# Patient Record
Sex: Female | Born: 1935 | Race: White | Hispanic: No | Smoking: Never smoker
Health system: Southern US, Community
[De-identification: ages and names within clinical notes are randomized; demographics above are authoritative.]

## PROBLEM LIST (undated history)

## (undated) DIAGNOSIS — M797 Fibromyalgia: Secondary | ICD-10-CM

## (undated) DIAGNOSIS — R609 Edema, unspecified: Secondary | ICD-10-CM

## (undated) DIAGNOSIS — M5416 Radiculopathy, lumbar region: Secondary | ICD-10-CM

## (undated) DIAGNOSIS — M1990 Unspecified osteoarthritis, unspecified site: Secondary | ICD-10-CM

## (undated) DIAGNOSIS — Z860101 Personal history of adenomatous and serrated colon polyps: Secondary | ICD-10-CM

## (undated) DIAGNOSIS — K279 Peptic ulcer, site unspecified, unspecified as acute or chronic, without hemorrhage or perforation: Secondary | ICD-10-CM

## (undated) DIAGNOSIS — R32 Unspecified urinary incontinence: Secondary | ICD-10-CM

## (undated) DIAGNOSIS — K760 Fatty (change of) liver, not elsewhere classified: Secondary | ICD-10-CM

## (undated) DIAGNOSIS — R42 Dizziness and giddiness: Secondary | ICD-10-CM

## (undated) DIAGNOSIS — E785 Hyperlipidemia, unspecified: Secondary | ICD-10-CM

## (undated) DIAGNOSIS — R140 Abdominal distension (gaseous): Secondary | ICD-10-CM

## (undated) DIAGNOSIS — S82841A Displaced bimalleolar fracture of right lower leg, initial encounter for closed fracture: Secondary | ICD-10-CM

## (undated) DIAGNOSIS — F419 Anxiety disorder, unspecified: Secondary | ICD-10-CM

## (undated) DIAGNOSIS — K589 Irritable bowel syndrome without diarrhea: Secondary | ICD-10-CM

## (undated) DIAGNOSIS — E041 Nontoxic single thyroid nodule: Secondary | ICD-10-CM

## (undated) DIAGNOSIS — R7989 Other specified abnormal findings of blood chemistry: Secondary | ICD-10-CM

## (undated) DIAGNOSIS — M810 Age-related osteoporosis without current pathological fracture: Secondary | ICD-10-CM

## (undated) DIAGNOSIS — C4490 Unspecified malignant neoplasm of skin, unspecified: Secondary | ICD-10-CM

## (undated) DIAGNOSIS — F329 Major depressive disorder, single episode, unspecified: Secondary | ICD-10-CM

## (undated) DIAGNOSIS — I10 Essential (primary) hypertension: Secondary | ICD-10-CM

## (undated) DIAGNOSIS — F32A Depression, unspecified: Secondary | ICD-10-CM

## (undated) DIAGNOSIS — R945 Abnormal results of liver function studies: Secondary | ICD-10-CM

## (undated) DIAGNOSIS — M5414 Radiculopathy, thoracic region: Secondary | ICD-10-CM

## (undated) DIAGNOSIS — Z86010 Personal history of colonic polyps: Secondary | ICD-10-CM

## (undated) DIAGNOSIS — K5790 Diverticulosis of intestine, part unspecified, without perforation or abscess without bleeding: Secondary | ICD-10-CM

## (undated) DIAGNOSIS — M316 Other giant cell arteritis: Secondary | ICD-10-CM

## (undated) DIAGNOSIS — K219 Gastro-esophageal reflux disease without esophagitis: Secondary | ICD-10-CM

## (undated) HISTORY — DX: Age-related osteoporosis without current pathological fracture: M81.0

## (undated) HISTORY — DX: Other giant cell arteritis: M31.6

## (undated) HISTORY — DX: Other specified abnormal findings of blood chemistry: R79.89

## (undated) HISTORY — DX: Radiculopathy, thoracic region: M54.14

## (undated) HISTORY — DX: Anxiety disorder, unspecified: F41.9

## (undated) HISTORY — PX: THYROIDECTOMY: SHX17

## (undated) HISTORY — PX: SKIN CANCER EXCISION: SHX779

## (undated) HISTORY — DX: Personal history of adenomatous and serrated colon polyps: Z86.0101

## (undated) HISTORY — DX: Essential (primary) hypertension: I10

## (undated) HISTORY — DX: Personal history of colonic polyps: Z86.010

## (undated) HISTORY — DX: Diverticulosis of intestine, part unspecified, without perforation or abscess without bleeding: K57.90

## (undated) HISTORY — DX: Fatty (change of) liver, not elsewhere classified: K76.0

## (undated) HISTORY — DX: Major depressive disorder, single episode, unspecified: F32.9

## (undated) HISTORY — DX: Radiculopathy, lumbar region: M54.16

## (undated) HISTORY — PX: NASAL SEPTUM SURGERY: SHX37

## (undated) HISTORY — DX: Nontoxic single thyroid nodule: E04.1

## (undated) HISTORY — DX: Gastro-esophageal reflux disease without esophagitis: K21.9

## (undated) HISTORY — DX: Fibromyalgia: M79.7

## (undated) HISTORY — DX: Unspecified malignant neoplasm of skin, unspecified: C44.90

## (undated) HISTORY — DX: Irritable bowel syndrome, unspecified: K58.9

## (undated) HISTORY — DX: Unspecified urinary incontinence: R32

## (undated) HISTORY — DX: Depression, unspecified: F32.A

## (undated) HISTORY — DX: Abnormal results of liver function studies: R94.5

## (undated) HISTORY — DX: Edema, unspecified: R60.9

## (undated) HISTORY — DX: Peptic ulcer, site unspecified, unspecified as acute or chronic, without hemorrhage or perforation: K27.9

## (undated) HISTORY — DX: Hyperlipidemia, unspecified: E78.5

## (undated) HISTORY — PX: LIVER BIOPSY: SHX301

## (undated) HISTORY — DX: Unspecified osteoarthritis, unspecified site: M19.90

## (undated) HISTORY — DX: Dizziness and giddiness: R42

## (undated) HISTORY — DX: Abdominal distension (gaseous): R14.0

## (undated) HISTORY — PX: ABDOMINAL HYSTERECTOMY: SHX81

---

## 1985-07-20 ENCOUNTER — Encounter: Payer: Self-pay | Admitting: Internal Medicine

## 1996-08-18 ENCOUNTER — Encounter: Payer: Self-pay | Admitting: Internal Medicine

## 2000-11-18 ENCOUNTER — Emergency Department (HOSPITAL_COMMUNITY): Admission: EM | Admit: 2000-11-18 | Discharge: 2000-11-18 | Payer: Self-pay

## 2002-11-08 ENCOUNTER — Encounter: Payer: Self-pay | Admitting: Allergy and Immunology

## 2002-11-08 ENCOUNTER — Encounter: Admission: RE | Admit: 2002-11-08 | Discharge: 2002-11-08 | Payer: Self-pay | Admitting: *Deleted

## 2003-03-19 HISTORY — PX: BACK SURGERY: SHX140

## 2003-06-13 ENCOUNTER — Encounter: Payer: Self-pay | Admitting: Internal Medicine

## 2004-03-06 ENCOUNTER — Inpatient Hospital Stay (HOSPITAL_COMMUNITY): Admission: RE | Admit: 2004-03-06 | Discharge: 2004-03-13 | Payer: Self-pay | Admitting: Specialist

## 2004-03-08 ENCOUNTER — Ambulatory Visit: Payer: Self-pay | Admitting: Physical Medicine & Rehabilitation

## 2004-03-13 ENCOUNTER — Inpatient Hospital Stay
Admission: RE | Admit: 2004-03-13 | Discharge: 2004-03-22 | Payer: Self-pay | Admitting: Physical Medicine & Rehabilitation

## 2004-03-18 HISTORY — PX: OTHER SURGICAL HISTORY: SHX169

## 2004-03-18 HISTORY — PX: EYE SURGERY: SHX253

## 2004-12-11 ENCOUNTER — Encounter: Admission: RE | Admit: 2004-12-11 | Discharge: 2004-12-11 | Payer: Self-pay | Admitting: Specialist

## 2005-10-18 ENCOUNTER — Encounter: Admission: RE | Admit: 2005-10-18 | Discharge: 2005-10-18 | Payer: Self-pay | Admitting: Specialist

## 2006-10-08 ENCOUNTER — Encounter: Admission: RE | Admit: 2006-10-08 | Discharge: 2006-10-08 | Payer: Self-pay | Admitting: Family Medicine

## 2006-10-08 ENCOUNTER — Encounter (INDEPENDENT_AMBULATORY_CARE_PROVIDER_SITE_OTHER): Payer: Self-pay | Admitting: *Deleted

## 2007-03-23 ENCOUNTER — Encounter: Admission: RE | Admit: 2007-03-23 | Discharge: 2007-03-23 | Payer: Self-pay | Admitting: Specialist

## 2007-11-10 ENCOUNTER — Ambulatory Visit: Payer: Self-pay | Admitting: Cardiovascular Disease

## 2007-11-11 ENCOUNTER — Encounter: Payer: Self-pay | Admitting: Cardiovascular Disease

## 2007-11-11 ENCOUNTER — Ambulatory Visit: Payer: Self-pay

## 2008-01-11 ENCOUNTER — Ambulatory Visit: Payer: Self-pay | Admitting: Gastroenterology

## 2008-01-11 ENCOUNTER — Telehealth: Payer: Self-pay | Admitting: Internal Medicine

## 2008-01-11 DIAGNOSIS — K589 Irritable bowel syndrome without diarrhea: Secondary | ICD-10-CM | POA: Insufficient documentation

## 2008-01-11 DIAGNOSIS — R1031 Right lower quadrant pain: Secondary | ICD-10-CM | POA: Insufficient documentation

## 2008-01-11 DIAGNOSIS — K5730 Diverticulosis of large intestine without perforation or abscess without bleeding: Secondary | ICD-10-CM | POA: Insufficient documentation

## 2008-01-12 ENCOUNTER — Ambulatory Visit: Payer: Self-pay | Admitting: Cardiovascular Disease

## 2008-01-12 LAB — CONVERTED CEMR LAB
BUN: 11 mg/dL (ref 6–23)
Basophils Absolute: 0.1 10*3/uL (ref 0.0–0.1)
Basophils Relative: 0.6 % (ref 0.0–3.0)
Bilirubin Urine: NEGATIVE
CO2: 29 meq/L (ref 19–32)
Calcium: 9.5 mg/dL (ref 8.4–10.5)
Chloride: 107 meq/L (ref 96–112)
Creatinine, Ser: 0.9 mg/dL (ref 0.4–1.2)
Crystals: NEGATIVE
Eosinophils Absolute: 0.2 10*3/uL (ref 0.0–0.7)
Eosinophils Relative: 1.8 % (ref 0.0–5.0)
GFR calc Af Amer: 78 mL/min
GFR calc non Af Amer: 65 mL/min
Glucose, Bld: 96 mg/dL (ref 70–99)
HCT: 41.8 % (ref 36.0–46.0)
Hemoglobin, Urine: NEGATIVE
Hemoglobin: 14.5 g/dL (ref 12.0–15.0)
Ketones, ur: NEGATIVE mg/dL
Leukocytes, UA: NEGATIVE
Lymphocytes Relative: 18.5 % (ref 12.0–46.0)
MCHC: 34.6 g/dL (ref 30.0–36.0)
MCV: 91.3 fL (ref 78.0–100.0)
Monocytes Absolute: 0.7 10*3/uL (ref 0.1–1.0)
Monocytes Relative: 5.6 % (ref 3.0–12.0)
Mucus, UA: NEGATIVE
Neutro Abs: 8.6 10*3/uL — ABNORMAL HIGH (ref 1.4–7.7)
Neutrophils Relative %: 73.5 % (ref 43.0–77.0)
Nitrite: NEGATIVE
Platelets: 281 10*3/uL (ref 150–400)
Potassium: 4.3 meq/L (ref 3.5–5.1)
RBC / HPF: NONE SEEN
RBC: 4.59 M/uL (ref 3.87–5.11)
RDW: 13 % (ref 11.5–14.6)
Sodium: 144 meq/L (ref 135–145)
Specific Gravity, Urine: 1.025 (ref 1.000–1.03)
Total Protein, Urine: 30 mg/dL — AB
Urine Glucose: NEGATIVE mg/dL
Urobilinogen, UA: 0.2 (ref 0.0–1.0)
WBC: 11.8 10*3/uL — ABNORMAL HIGH (ref 4.5–10.5)
pH: 5.5 (ref 5.0–8.0)

## 2008-01-18 ENCOUNTER — Ambulatory Visit: Payer: Self-pay | Admitting: Gastroenterology

## 2008-01-18 ENCOUNTER — Telehealth: Payer: Self-pay | Admitting: Internal Medicine

## 2008-01-18 DIAGNOSIS — K921 Melena: Secondary | ICD-10-CM | POA: Insufficient documentation

## 2008-01-19 ENCOUNTER — Encounter: Payer: Self-pay | Admitting: Internal Medicine

## 2008-01-19 ENCOUNTER — Encounter: Payer: Self-pay | Admitting: Physician Assistant

## 2008-01-19 ENCOUNTER — Ambulatory Visit: Payer: Self-pay | Admitting: Internal Medicine

## 2008-01-21 ENCOUNTER — Encounter: Payer: Self-pay | Admitting: Internal Medicine

## 2008-01-21 LAB — CONVERTED CEMR LAB
Basophils Absolute: 0 10*3/uL (ref 0.0–0.1)
Basophils Relative: 0.2 % (ref 0.0–3.0)
Eosinophils Absolute: 0.2 10*3/uL (ref 0.0–0.7)
Eosinophils Relative: 2.2 % (ref 0.0–5.0)
HCT: 41.6 % (ref 36.0–46.0)
Hemoglobin: 14.5 g/dL (ref 12.0–15.0)
Lymphocytes Relative: 24.2 % (ref 12.0–46.0)
MCHC: 34.8 g/dL (ref 30.0–36.0)
MCV: 91.5 fL (ref 78.0–100.0)
Monocytes Absolute: 0.7 10*3/uL (ref 0.1–1.0)
Monocytes Relative: 7.6 % (ref 3.0–12.0)
Neutro Abs: 6.5 10*3/uL (ref 1.4–7.7)
Neutrophils Relative %: 65.8 % (ref 43.0–77.0)
Platelets: 270 10*3/uL (ref 150–400)
RBC: 4.54 M/uL (ref 3.87–5.11)
RDW: 13.3 % (ref 11.5–14.6)
Sed Rate: 27 mm/hr — ABNORMAL HIGH (ref 0–22)
WBC: 9.7 10*3/uL (ref 4.5–10.5)

## 2008-06-29 ENCOUNTER — Encounter: Payer: Self-pay | Admitting: Internal Medicine

## 2008-07-29 ENCOUNTER — Encounter: Payer: Self-pay | Admitting: Internal Medicine

## 2008-08-04 ENCOUNTER — Encounter: Payer: Self-pay | Admitting: Internal Medicine

## 2008-12-23 ENCOUNTER — Encounter: Payer: Self-pay | Admitting: Internal Medicine

## 2008-12-26 ENCOUNTER — Telehealth: Payer: Self-pay | Admitting: Internal Medicine

## 2008-12-26 ENCOUNTER — Encounter: Payer: Self-pay | Admitting: Internal Medicine

## 2008-12-27 DIAGNOSIS — Z860100 Personal history of colon polyps, unspecified: Secondary | ICD-10-CM | POA: Insufficient documentation

## 2008-12-27 DIAGNOSIS — Z8639 Personal history of other endocrine, nutritional and metabolic disease: Secondary | ICD-10-CM

## 2008-12-27 DIAGNOSIS — Z862 Personal history of diseases of the blood and blood-forming organs and certain disorders involving the immune mechanism: Secondary | ICD-10-CM | POA: Insufficient documentation

## 2008-12-27 DIAGNOSIS — E785 Hyperlipidemia, unspecified: Secondary | ICD-10-CM | POA: Insufficient documentation

## 2008-12-27 DIAGNOSIS — K7689 Other specified diseases of liver: Secondary | ICD-10-CM | POA: Insufficient documentation

## 2008-12-27 DIAGNOSIS — E781 Pure hyperglyceridemia: Secondary | ICD-10-CM | POA: Insufficient documentation

## 2008-12-27 DIAGNOSIS — I10 Essential (primary) hypertension: Secondary | ICD-10-CM | POA: Insufficient documentation

## 2008-12-30 ENCOUNTER — Ambulatory Visit: Payer: Self-pay | Admitting: Internal Medicine

## 2008-12-30 LAB — CONVERTED CEMR LAB
A-1 Antitrypsin, Ser: 146 mg/dL (ref 83–200)
AFP-Tumor Marker: 3.8 ng/mL (ref 0.0–8.0)
Anti Nuclear Antibody(ANA): NEGATIVE
Ceruloplasmin: 34 mg/dL (ref 21–63)
HCV Ab: NEGATIVE
Hep B S Ab: NEGATIVE
Hepatitis B Surface Ag: NEGATIVE

## 2009-01-02 ENCOUNTER — Encounter: Payer: Self-pay | Admitting: Internal Medicine

## 2009-01-03 LAB — CONVERTED CEMR LAB
Ferritin: 69.6 ng/mL (ref 10.0–291.0)
INR: 1 (ref 0.8–1.0)
IgG (Immunoglobin G), Serum: 577 mg/dL — ABNORMAL LOW (ref 694–1618)
IgM, Serum: 72 mg/dL (ref 60–263)
Prothrombin Time: 10.8 s (ref 9.1–11.7)
TSH: 2.75 microintl units/mL (ref 0.35–5.50)

## 2009-01-10 ENCOUNTER — Encounter: Payer: Self-pay | Admitting: Internal Medicine

## 2010-03-18 HISTORY — PX: OTHER SURGICAL HISTORY: SHX169

## 2010-07-31 NOTE — Assessment & Plan Note (Signed)
[facility]                            CARDIOLOGY OFFICE NOTE   Sally, Acosta                        MRN:          [id number]  DATE:11/10/2007                            DOB:          1929/08/10    A 77 year old patient referred for profound fatigue, dyspnea.   The patient had been feeling ill about 6-7 weeks ago.  She says she has  a history of fibromyalgia.  She had profound weakness.  She had  difficulty even lifting her legs up.  She was seen by Dr. Collins Acosta and  blood work was done.  I have some of the blood work and her white blood  cell count, hemoglobin, crit, and BMET were normal.  I do not have her  TSH back yet.   Aside from general malaise, the patient denies being depressed.  In  fact, she just celebrated her 1st anniversary with her second husband  this past Saturday.  She initially thought that her profound fatigue was  due to her fibromyalgia, but it did not seem to improve.  Interestingly,  over the last 2 weeks, she seems to be doing better.  There has been no  generalized weight loss.  No fever.  She does complain of diaphoresis  and what sound like hot flashes.   She says she went through menopause many years ago and does not know why  she would have these.   From a cardiac perspective, there has been no palpitations, PND, or  orthopnea.  No presyncope.  No evidence of low blood pressure.  She has  never had a history of coronary artery disease or decreased LV function.   She has been seen in the clinic previously by Dr. Jimmy Footman in 1997 and  had a normal stress test.  At that time, her coronary risk factor  included hypercholesterolemia and hypertension.   The patient's review of system is otherwise negative.  She does have  some chronic bronchitis and asthma and wheezes a bit with exercise.  There is some functional exertional dyspnea, although she seems out of  shape and does have her asthma.  I do not think that her  dyspnea is  related to her heart.   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   ALLERGIES:  The patient is allergic to SULFA.   MEDICATIONS:  She is on Azor 10/40; fish oil; fluoxetine, question dose;  aspirin; Prilosec; ReQuip; and Nasonex.   FAMILY HISTORY:  Noncontributory.   SOCIAL HISTORY:  The patient is happily remarried.  She does not smoke.  She has no alcohol.  She is otherwise fairly sedentary.  She no longer  works and is retired.  She does watch a lot of TV.   PHYSICAL EXAMINATION:  GENERAL:  Her exam is remarkable for an  overweight white female in no distress.  Affect is appropriate.  VITAL SIGNS:  Weight is 191, blood pressure is 140/80, pulse  88 and  regular, respiratory 14, afebrile.  HEENT:  Unremarkable.  NECK:  Carotids normal without bruit.  No lymphadenopathy, thyromegaly,  or JVP elevation.  LUNGS:  Clear with good diaphragmatic motion.  No wheezing.  CARDIAC:  S1 and S2.  Normal heart sounds.  PMI normal.  ABDOMEN:  Benign.  Bowel sounds positive.  No AAA.  No tenderness.  No  bruit.  No hepatosplenomegaly or hepatojugular reflux.  No tenderness.  EXTREMITIES:  Distal pulses are intact.  No edema.  NEURO:  Nonfocal.  SKIN:  Warm and dry.  MUSCULOSKELETAL:  No muscular weakness.   SURGICAL HISTORY:  History of deviated septum, history of a  hysterectomy, and partial history of lower back surgery.   Her electrocardiogram today shows sinus rhythm with nonspecific ST-T  wave changes and no change from EKG done in 1997.   IMPRESSION:  1. Fatigue, not related to heart.  Normal cardiac exam.  No change in      EKG.  2. Exertional dyspnea likely related asthmatic bronchitis.  Consider      followup PFTs.  She has previously been on Advair and this was      stopped due  to blistering of her mouth, consider alternative.  We      will check a 2-D echocardiogram to make sure her right ventricle      and left ventricle function are normal and that there is no      structural heart disease accounting for her profound fatigue and      dyspnea.  3. Hypertension, currently well controlled.  Continue angiotensin-      converting enzyme.  4. Hyperlipidemia, low cholesterol diet.  Continue attempts of weight      loss and exercise.  Continue Azor.  5. Overall, the patient does deny history of depression; however,      given her history of fibromyalgia, I wonder if it would not be      worthwhile for her to switch to a different antidepressant.  I am      also not up-to-date on the latest fibromyalgia drugs, but it may be      worthwhile for her to try one of these.  She will follow with Dr.      Collins Acosta for any further lab work that is necessary.  As long as her      echo is normal, she will not need cardiac followup.     Noralyn Pick. Eden Emms, MD, [facility]  Electronically Signed    PCN/MedQ  DD: 11/10/2007  DT: 11/11/2007  Job #: [id number]   cc:   [name], M.D.

## 2010-08-03 NOTE — Op Note (Signed)
NAMEJOWANDA, HEEG               ACCOUNT NO.:  [id number]   MEDICAL RECORD NO.:  [id number]          PATIENT TYPE:  INP   LOCATION:  5006                         FACILITY:  [facility]   PHYSICIAN:  Kerrin Champagne, M.D.   DATE OF BIRTH:  06-30-1929   DATE OF PROCEDURE:  03/07/2004  DATE OF DISCHARGE:                                 OPERATIVE REPORT   PREOPERATIVE DIAGNOSIS:  Lumbar spinal stenosis, L3-4 and L4-5.  Degenerative disk disease, L3-4 and L4-5, L5-S1 and L2-3 with degenerative  spondylolisthesis at L4-5 and L5-S1 and L3-4.  Herniated nucleus pulposus  left L2-3.  Central laminectomy L3 to S1 three levels.  Left-sided L2-3  microdiskectomy.  L3 to S1 posterolateral fusion utilizing local and  Symphony bone graft, posterior instrumentation from L3 to S1 utilizing  Monarch pedicle screws and rods.  Right-sided L4-5 and L5-S1 transforaminal  lumbar interbody fusion utilizing Leopard  cage at the L4-5 level and  Leopard lordotic 9 mm cage at the L5-S1 level.   POSTOPERATIVE DIAGNOSIS:   OPERATION PERFORMED:   SURGEON:  Kerrin Champagne, M.D.   ASSISTANT:  Wende Neighbors, P.A.-C   ANESTHESIA:  General orotracheal anesthesia.  Dr. Gypsy Balsam.   ESTIMATED BLOOD LOSS:  650 mL.   BLOOD REPLACED:  One unit Cell Saver blood.   DRAINS:  Foley catheter to straight drain.  Hemovac times one.   INDICATIONS FOR PROCEDURE:  The patient is a 77 year old female with  progressive neurogenic claudication.  Pain is worsening in both lower  extremities with ambulation upright position.  Evaluated, found to have  three segments of lumbar spinal stenosis involving L3-4, L4-5 and mild L2-3  with associated protruded disk along the left side at L2-3.  Degenerative  disk changes at L5-S1 with spondylolisthesis at L5-S1 that is dynamic with  flexion only.  She is brought to the operating room to undergo initially a  two-level fusion at L4-5 and L5-S1; however, since the patient has a  spondylolisthesis at the L3-4 level as well, it is felt that the fusion  should be extended to the L3-4 level as well and this was done during the  procedure.  The patient was found to have disk protrusion on the left side  at L2-3 and a subligamentous disk herniation was excised.   DESCRIPTION OF PROCEDURE:  After adequate general anesthesia, initially  intubated, a Foley catheter placed, was then rolled to a supine position.  Chest rolls were used.  All pressure points well padded.  Standard prep with  DuraPrep solution.  Draped in the usual manner extending from the dorsal  spine to the mid sacral level.  She was given preoperative antibiotics of  Ancef.  Also had preoperative placement of leg EMG nerve conduction  monitoring of soft tissue resistance and EMGs while placing pedicle screws.  Initial incision extending from about the L1 level to S2.  The midline  through the skin and subcutaneous layers and S1.  Incision carried over the  lateral aspect of the spinous process of L5, L4, L3, L2.  Clamps were placed  over the spinous  process of L5 and L4 and these areas marked for continued  identification throughout the procedure.   Initial cerebellar retractors were placed as the paralumbar muscles were  released off the posterior aspects of the posterior elements extending from  L2 to the sacrum to 1 levels.  Exposure obtained out to the transverse  process of L3 at the L2-3 level, L4 at the L4-5 level and over the sacral  ala on both sides at the L5-S1 level.   Micro retractor replaced and deepened.  Sponges used to control bleeders as  well as electrocautery and bipolar electrocautery.  Leksell rongeur then  used to resect the spinous process of L3-L4 and L5 removing also the spinous  process of S1, inferior 50% of the spinous process of L2, central portions  and lamina of L3, L4 then used to remove central portions of lamina of L5  continuing superiorly the L4-5 level down to the  inferior aspect of L4,  continue superiorly resecting the central portions of lamina up to the L3-4  level and then the inferior aspect of the L3 continued superiorly with the  L2-3 level widening the central laminectomy using 3 mm Kerrison.  1/4 inch  osteotome then used to resect medial facet over the inferior articular  process of L5 bilaterally resecting about 50% of the joint at the L4-5 level  similarly resecting the inferior articular process medially at the 4-5 level  bilaterally about 50%.  Only minimal partial resection on the left side  about 10 to 15% was performed for exposure of the left lateral aspect of the  canal.  Ligamentum flavum resected at the L2-3 level off of the inferior  anterior aspect of the lamina of L2 bilaterally.  Then from the area of the  L2-3 interlaminar region posteriorly.  Then at 3-4 and at 4-5 and 5-1.  Note  that at 3-4, 4-5 and 5-1 hypertrophic ligamentum flavum changes were causing  significant compression on the thecal sac bilaterally within the lateral  recesses impinging on bilateral  L3 nerve roots within the neuroforamen.  The L4 nerve roots were then lateral recessed and L4 neuroforamen and the L5  foraminotomy was performed over both L5 nerve roots, both S1 nerve roots,  both L4 nerve roots and both L3 nerve roots such that a hockey stick neuro  probe could finally be passed out the neuroforamen over the anterior and  posterior aspects of each L3, L4, L5 and S1 nerve roots on the left side and  each L4, L5 and S1 on the right side therefore determining that  decompression had taken place.  The operating room microscope was draped and  brought into the field.  From the left side the thecal sac the L2-3 level  was carefully retracted medially to L2-3, noted to have protruding disk on  the left side subligamentous fashion.  A cruciate incision made in the posterior aspect of the disk on the left side and micropituitary used  to  excise moderate  protruded subligamentous disk on the left side which was  impinging on the left lateral recess and affecting it.   This completed then, attention turned to performing placement of pedicle  screws.  Local bone graft obtained from the central laminectomy site.  It  was carefully debrided of any soft tissue attachment and then used in  combination with Symphony and allograft bone material and make Symphony logs  that had platelet rich plasma as well with increased local bone building  factors.  Two Symphony logs were able to be made with this material.  A  pedicle finder was used to make an initial entry point into the midportion  of the transverse process and the center section of the superior articular  process of L3 on the left side and then a pedicle probe used to probe the  pedicle on the left side to about 40 mm.  Similarly this was done on the  right side at L3 pedicle at the level of the L3 transverse process section  with the superior articular process of L4.  This was done on the left side,  then on the right side and then performed using pedicle probe straight probe  left side then right side.  Each of the pedicle channels was then probed  using a ball tip probe to ensure patency of the canal and no evidence of  broach of the cortex.  C-arm fluoroscopy brought into the field ascertaining  correct position and alignment of each of the probes within the channels  within the pedicles seen on AP and lateral views.  Each of these holes at  the L3 and L4 were then carefully tapped with a 5.5 tap and using a proper  degree of convergence, 40 x 6.25.  The L4 level 45 mm screws were placed  bilaterally measuring 6.  Next the L5 pedicle screws were placed.  Note that  prior to placing the screws, the transverse processes were decorticated and  Symphony bone graft placed over the transverse process of L3 to L4  bilaterally.  The L5 pedicles were then probed each after placing an awl  within  the central portions of the transverse process and resection of the  superior articular process of L5 and then probing the pedicle appropriately.  Then __________ using first an awl to make an entry point into the posterior  aspect of the pedicle, the pedicle probe probed appropriately measuring  depth and placing a curved __________  each of the screw head fasteners was  carefully loosened using the screw breakers, head breakers.  Each was  measured for soft tissue resistance.  Each measured greater than 20 mm.  A  95 mm length curved rod was then placed into the fastener on the left side  also on the right side.  Right and left side caps were placed.  Having to  use the approximator at the L4 level bilaterally was the only segment  requiring the approximator to place the cap onto the posterior aspect of the  fastener at this segment. It did not require a great deal of physical stress to place this.  Note that bone graft was applied over the transverse process  extending from L3 to L4 to L5 and to the sacral ala, both sides over all.  These areas were decorticated prior to placement of pedicle screws and bone  graft was then placed prior to placement of pedicle screws in place.  Attention was then turned to TLIF on the right side at L4-5 and L5-S1.  Medial facetectomy complete fastening on the right side at L4-5 and on the  right side at L5-S1, completing resection of the facet out into the foramen  decompressing the right L4 and L5 nerve roots.  Attention was turned to the  L4 level where the epidural veins were cauterized along the right side at  the posterior aspect of the disk space at L4-5.  Retracted D'Errico nerve  root as well as the thecal  sac then incision made in the posterior aspect of  this posterolaterally using a 15 blade scalpel.  Pituitaries used to excise  the disk.  The disk was dilated first using an 8 mm dilator unable to use a  9 mm dilator.  It was felt that an 8 was  adequate as judged on a C-arm  fluoro to fill the disk space well.  With attempts at placing a 8 mm trial  TLIF it was noted that this could not be placed adequately. It was felt to  provide too tight a fit so that a 7 mm trial was used.  This provided  excellent dilatation of the disk space and good bone to trial surfaces.  Curettage was performed in the disk space end plate using regular curettes  as well as ring curettes.  Pituitary rongeurs used to resect further disk  material present. A small amount of bone graft obtained posteriorly from the  facets was placed within the disk space at L4-5.  Bone graft was obtained  from the right iliac crest through a separate fascial incision over the  right iliac crest beneath the subcu layers of fat the right side.  Incision  on the posterior aspect of the iliac crest subperiosteally dissection  carried medially and lateral. A 3/4 inch osteotome used to resect bone over  the superior aspect of the crest and then gouges used to remove cancellous  bone used for the TLIF at both L4-5 and L5-S1.  This cancellous bone  provided the best bone graft for placement within the cages and potentially  into the disk space at 4-5 and 5-1.  Once the cage had been filled,  distraction was obtained across the pedicle screws in the L4-5 level along  the right side with retraction of the thecal sac and nerve root and ensuring  these were well protected in the cage a permanent 7 mm parallel edge cage  was then inserted and impacted in place and kicked across midline into good  position and alignment in both AP and lateral planes.  Attention was then  turned to the L5-S1 level where similarly the disk space was exposed on the  right side, epidural veins cauterized using bipolar cautery.  The L5 nerve  root retracted superiorly, disk space exposed by retracting lateral aspect  of thecal sac medially.  15 blade scalpel used to incise the disk space. Pituitary rongeurs  used to excise disk material back to end plates.  End  plated curettaged using plain curettes as well as ring curettes down to  bleeding bone end plates.  Dilatation of the disk space carried up to  approximately 9 mm.  9 mm trial was inserted provided excellent fit. It was  decided to use a 9 mm lordotic cage.   This was then removed.  Disk space had small amount of bone graft placed  within the disk space.  The cage was then filled with cancellous bone graft  taken from the right iliac crest.  The cage then placed in the correct  position alignment.  Distraction obtained across the pedicle screws on the  right side at L5 and S1 and the cage inserted, impacted into place  subsetting it beneath posterior aspect of the disk space at L5-S1, then  kicking it across the midline using the kicker impactors.  This observed  with C-arm fluoroscopy to be in good position and alignment.  Irrigation  performed.  Thrombin-soaked Gelfoam used to perform hemostasis as well  as  bipolar electrocautery.  Examination of disk spaces demonstrated no further  bleeding evident.  No bone within the canal noted.  With this, then the  fasteners at the L3 level were carefully torqued down to the rods at the L3  level to 80 foot pounds.  The L4-5 level the fastener at the L4 level was  then carefully torqued 80 foot pounds bilaterally.  Then the 4-5 fasteners  were carefully torqued and placed under compression compressing the L4-5  level.  Then compression obtained between the L5 and S1 fasteners  bilaterally and compression obtained and these were then fastened to 80 foot  pounds.  This completed fixation of spine from L3 to S1.  And the TLIF of  the L4-5 and L5-S1 level.  C-arm images were obtained following placement of  the transverse loading rod at the L3-4 level of 100 mm length from side-to-  side.  Irrigation performed.  Carefully, the paralumbar muscles bilaterally  debrided of any devitalized tissue.   Medium Hemovac drain placed in the  depth of the incision exiting over the right side, the iliac crest bone  graft harvest site carefully hemostased with bone wax, thrombin-soaked  Gelfoam.  The fascial layers then approximated with over the iliac crest  with running stitch of #1 Vicryl.  The plane to the iliac crest carefully  closed with interrupted #1 Vicryl sutures.  The paralumbar muscles  approximated with loosely in the midline with interrupted #1 Vicryl sutures,  paralumbar muscles, lumbodorsal fascia approximated in the midline with  interrupted  figure-of-eight simple sutures of #1 Vicryl, deep subcu layers  approximated with interrupted #1 and 0 Vicryl sutures, more superficial  layers with interrupted 2-0 Vicryl sutures and skin closed with running  subcutaneous stitch of 4-0 Vicryl.  Tincture of Benzoin and Steri-Strips  applied, 4 x 4s, ABD pad fixed to the skin with paper tape.  The patient was then returned to a supine position, reactivated, extubated returned to  recovery room in satisfactory condition. All instrument sponge counts were  correct.      Jame   JEN/MEDQ  D:  03/07/2004  T:  03/08/2004  Job:  [id number]

## 2010-08-03 NOTE — Discharge Summary (Signed)
[name], [name]               ACCOUNT NO.:  [id number]   MEDICAL RECORD NO.:  [id number]          PATIENT TYPE:  ORB   LOCATION:  4531                         FACILITY:  [facility]   PHYSICIAN:  Mariam Dollar, P.A.  DATE OF BIRTH:  [birth year]   DATE OF ADMISSION:  03/13/2004  DATE OF DISCHARGE:  02/20/2004                                 DISCHARGE SUMMARY   DISCHARGE DIAGNOSES:  1.  Lumbar spinal stenosis with radiculopathy.  2.  Pain control.  3.  Subcutaneous Lovenox for deep vein thrombosis prophylaxis.  4.  Hypertension.  5.  Hyperlipidemia.  6.  Chronic obstructive pulmonary disease.  7.  Gastroesophageal reflux disease.  8.  Irritable bowel syndrome.  9.  Depression.  10. E. coli gram-negative rod urinary tract infection, resolved.   PROCEDURES:  Status post lumbar L3-L4, L4-L5, L5-S1 central laminectomy with  fusion March 06, 2004 for Dr. Otelia Sergeant.   HISTORY OF PRESENT ILLNESS:  This is a 77 year old female admitted on  March 06, 2004 with low back pain radiating to the lower extremities.  X-  rays and imaging with lumbar spinal stenosis with radiculopathy.  Underwent  L3-4, L4-5, L5-S1 central laminectomy with fusion on March 06, 2004 for  Dr. Otelia Sergeant.  Fitted with back brace.  Mild right foot drop, fitted with AFO  brace.  Pain control with OxyContin sustained release 10 mg every 12 hours,  admitted to subacute care services.   PAST MEDICAL HISTORY:  See discharge diagnoses.   ALLERGIES:  SULFA and SEPTRA.   SOCIAL HISTORY:  [place]-level  home with a ramp.  Son with anterior cervical disk fusion in the past on  disability but can assist.   MEDICATIONS PRIOR TO ADMISSION:  Prozac, aspirin, enalapril, Neurontin,  Vytorin, Prilosec, and multiple nebulizer treatments.   HOSPITAL COURSE:  The patient is with progressive gains while on  rehabilitation services with therapies initiated daily.  The following  issues are  followed during the patient's rehabilitation course.  Pertaining  to [name]'s lumbar spinal stenosis with radiculopathy, surgical site  healing nicely.  Back brace when out of bed.  She was now supervision for  ambulating with a rolling walker.  She would follow up with Dr. Otelia Sergeant.  Pain  control with OxyContin sustained release.  This had been titrated to 30 mg  every 12 hours with slow taper at the time of discharge.  Her pain had  greatly improved.  She did remain on her Neurontin at 300 mg twice daily.  Subcutaneous Lovenox was used for deep vein thrombosis prophylaxis  throughout her rehabilitation course and discontinued at discharge.  Aspirin  resumed as prior to hospital admission.  Blood pressure is controlled with  Vasotec.  She remained on her home nebulizer treatments for chronic  obstructive pulmonary disease with oxygen saturations greater than 90%.  She  had no bouts of shortness of breath.  There was a documented history of  depression.  She remained on Prozac daily.  Her mood and spirit remained  upbeat throughout her rehabilitation course.  She had  no bowel or bladder  disturbances.  Overall, for her functional state she was supervision bed  mobility, minimal assist transfers, supervision 60 feet with a rolling  walker, simple set up for activities of daily living other than minimal  assistance, lower body dressing.  She was discharged to home with home  health therapy.   LATEST LABORATORY:  Hemoglobin 9.6, hematocrit 27.8, sodium 137, potassium  4.4, BUN 9, creatinine 0.6.  Noted E. coli, gram-negative rod, urinary tract  infection.  She had been placed on amoxicillin March 17, 2004 for a seven-  day course.  She denied any dysuria or hematuria.   DISCHARGE MEDICATIONS:  1.  Protonix 40 mg daily.  2.  Nasonex 50 mcg 2 sprays daily.  3.  MiraLax daily.  4.  Colace 100 mg twice daily.  5.  Prozac 40 mg daily.  6.  Aspirin 81 mg daily.  7.  Vasotec 5 mg daily.   8.  Neurontin 300 mg twice daily.  9.  Vytorin 1 tablet daily.  10. Amoxicillin 250 mg three times daily until March 24, 2004, then stop.  11. OxyContin sustained release 30 mg every 12 hours x1 week and then 20 mg      every 12 hours x1 week, then 10 mg every 12 hours x1 week, then      discontinue.  12. Oxycodone immediate release as needed for breakthrough pain.   ACTIVITY:  As tolerated with back brace.   DIET:  Regular.   WOUND CARE:  Cleanse incision daily with soap and water.   SPECIAL INSTRUCTIONS:  Home health physical and occupational therapy.  She  will follow up with Dr. Otelia Sergeant as advised.       DA/MEDQ  D:  03/21/2004  T:  03/21/2004  Job:  [id number]   cc:   Ranelle Oyster, M.D.  [address]  [place] [address]  Fax: [phone]   Tammy R. Collins Scotland, M.D.  [address]  [place]  [place] [address]  Fax: [phone]   Kerrin Champagne, M.D.  [address]  [place]  [place] [address]  Fax: [phone]

## 2010-08-03 NOTE — Discharge Summary (Signed)
[name], [name]               ACCOUNT NO.:  [id number]   MEDICAL RECORD NO.:  [id number]          PATIENT TYPE:  INP   LOCATION:  5006                         FACILITY:  [facility]   PHYSICIAN:  Kerrin Champagne, M.D.   DATE OF BIRTH:  1930-01-27   DATE OF ADMISSION:  03/06/2004  DATE OF DISCHARGE:  03/13/2004                                 DISCHARGE SUMMARY   ADMISSION DIAGNOSIS:  1.  Lumbar spinal stenosis L3-4 and L4-5. Degenerative disk disease L3-4 and      L4-5, L5-S1 and L2-3 with degenerative spondylolisthesis at L4-5 and L5-      S1 and L3-4. Herniated nucleus pulposus left L2-3.  2.  Fibromyalgia.  3.  Hypertension.  4.  Irritable bowel syndrome.  5.  Gastroesophageal reflux disease.  6.  Anxiety and depression.   DISCHARGE DIAGNOSIS:  1.  Lumbar spinal stenosis L3-4 and L4-5. Degenerative disk disease L3-4 and      L4-5, L5-S1 and L2-3 with degenerative spondylolisthesis at L4-5 and L5-      S1 and L3-4. Herniated nucleus pulposus left L2-3.  2.  Fibromyalgia.  3.  Hypertension.  4.  Irritable bowel syndrome.  5.  Gastroesophageal reflux disease.  6.  Anxiety and depression.  7.  Mild postop anemia.  8.  Mild postop ileus resolved.  9.  Urinary retention resolved.   PROCEDURE:  On March 06, 2004, the patient underwent central laminectomy  L3-S1. Left side L2-3 microdiskectomy. L3-S1 posterolateral fusion utilizing  a local and Symphony bone graft, posterior instrumentation from L3-S1  utilizing Monarch pedicle screws and rods. Right sided L4-5 and L5-S1  transforaminal lumbar interbody fusion using Leopard cage at the L4-5 level  and Leopard lordotic cage at the L5-S1 level. This was performed by Kerrin Champagne, M.D. and assisted by Wende Neighbors, P.A. under general anesthesia.   CONSULTATION:  Physical medicine and rehabilitation.   BRIEF HISTORY:  The patient is a 77 year old female with progressive  neurogenic claudication and worsening lower extremity  pain with difficulty  ambulating. She was noted to have three segments of lumbar spinal stenosis  involving L3-4, L4-5 and mild at L2-3 with associated disk protrusion along  the left side at the L2-3 level. Degenerative disk changes at the L5-S1  level with spondylolisthesis that was dynamic with flexion only was also  noted on evaluation. It was felt that she would require surgical  intervention as she was having no relief of her discomfort with conservative  treatment. She had progressive debilitation. The patient was admitted for  the procedure as stated above.   BRIEF HOSPITAL COURSE:  The patient tolerated the procedure under general  anesthesia without complications. She received 1 unit of Cell Saver blood  intraoperatively. The patient's hemoglobin and hematocrit postoperatively  was 8.2 and 23.80. She did require further blood transfusion.  Postoperatively the patient did have low urine output as well as low blood  pressure. Her IV fluids were increased and transfusion given as well. The  patient was placed on ileus precautions and was NPO. She was given Reglan IV  and Dulcolax p.o. The patient had difficulty with attempts at ambulation on  the first postoperative day. She was somewhat sedated secondary to narcotic  analgesics. Physical therapy was eventually able to assist the patient with  sitting up in bed and then gradually progressing onto ambulation. Her lumbar  brace was donned and doffed with the patient seated at bedside. She was very  slow to progress with her activity level and a rehab consult was obtained.  She was felt to be a suitable candidate for inpatient rehabilitation and was  placed on the bed waiting list. While on the bed waiting list she continued  to be monitored on the orthopedic unit. There her sedation decreased as her  narcotic analgesics were decreased and she was able to be changed to p.o.  analgesics. The patient did have some noticeable weakness  with the right  EHL. She also had skin tears from the tape on her back. Her incision was  healing well throughout the hospital stay. Her drain was removed on the  first postoperative day. The patient continued to have a distended abdomen  with minimal bowel sounds until she had a bowel movement on the third  postoperative day. After the bowel movement her diet was progressed. She was  then able to take a regular diet without difficulty. An AFO was obtained for  her right ankle dorsiflexion weakness to be worn with her regular shoeing.  Eventually a bed was available on the rehab unit on March 13, 2004 and  she was admitted there. At discharge she was in stable condition.   LABORATORY VALUES:  The patient received a total of 3 units of packed red  blood cells during the hospital stay secondary to anemia. On March 08, 2004, portable abdomen series showed moderate ileus. On admission CBC was  within normal limits. Hemoglobin dropped to the lowest value of 8.21 and at  discharge the patient's hemoglobin was 9.2 with hematocrit 26.9. On December  25, her WBC was 11.2. Coagulation studies on admission normal. Chemistry  studies on admission were within normal limits with exception of AST 63, ALT  45. The patient did have low sodium of 134 on December 23. Glucose  fluctuated from 89-151. IV fluid adjustments were made to amend these  chemistry values. Urinalysis on December 26 was negative for urinary tract  infection. Repeat on December 23 showed moderate leukocyte Estrace, few  epithelial cells, 7-10 WBCs, 0-2 RBCs with few bacteria.   PLAN:  The patient was transferred to the inpatient subacute care unit for  continued physical therapy, occupational therapy. She was given instructions  for ambulation and gait training wearing her LSO at all times when out of  bed. She was able to be at bedrest without the lumbar support. Dressing changes were done daily with appropriate wound care.  Occupational therapy  assisted with ADLs. A list of the patient's medications were sent with her  to the rehab unit for continued treatment. She was instructed to follow up  with Dr. Otelia Sergeant following her stay at the rehab unit. Dr. Otelia Sergeant also will  follow with the patient while she is still in the hospital for continued  orthopedic care.   CONDITION ON DISCHARGE:  Stable.      SMV/MEDQ  D:  05/31/2004  T:  05/31/2004  Job:  [id number]

## 2010-09-05 ENCOUNTER — Encounter: Payer: Self-pay | Admitting: Internal Medicine

## 2010-09-05 ENCOUNTER — Ambulatory Visit (INDEPENDENT_AMBULATORY_CARE_PROVIDER_SITE_OTHER): Payer: Medicare Other | Admitting: Internal Medicine

## 2010-09-05 VITALS — BP 132/64 | HR 88 | Ht 63.0 in | Wt 198.0 lb

## 2010-09-05 DIAGNOSIS — R160 Hepatomegaly, not elsewhere classified: Secondary | ICD-10-CM

## 2010-09-05 DIAGNOSIS — K7689 Other specified diseases of liver: Secondary | ICD-10-CM

## 2010-09-05 DIAGNOSIS — R7989 Other specified abnormal findings of blood chemistry: Secondary | ICD-10-CM

## 2010-09-05 NOTE — Progress Notes (Signed)
[name] 12-06-1929 MRN [id number]     History of Present Illness:  This is a 77 year old white female with hepatomegaly and abnormal liver function tests. She had a complete evaluation in October 2010. At that time, she had an AST of 70, ALT of 51 and serum albumin of 4.1. Her CT scan in May 2010 confirmed hepatomegaly due to fatty liver. Her ferritin was 69, IgG was 577, IgM was 72, and INR was 1.0. Her Hepatitis B and C serologies have been negative. Her ceruloplasmin was normal as well as her alpha-fetoprotein and ANA titer. She continues to have abnormal AST and ALT. Her most recent blood tests on 07/27/2010 showed AST of 111 and ALT of 78 with a normal alkaline phosphatase, albumin and total bilirubin. She denies abdominal pain. She denies easy bruising but admits to peripheral fluid retention. She has taken Celebrex intermittently for her DJD. A colonoscopy in November 2009 showed an adenomatous polyp. Her next colonoscopy will be due in November 2014.   Past Medical History  Diagnosis Date  . Anxiety disorder   . Arthritis   . Asthma   . Hx of adenomatous colonic polyps   . Fibromyalgia   . Hyperlipidemia   . Hypertension   . IBS (irritable bowel syndrome)   . Peptic ulcer disease   . Fatty liver   . Abnormal LFTs (liver function tests)   . Diverticulosis    Past Surgical History  Procedure Date  . Abdominal hysterectomy   . Thyroidectomy   . Nasal septum surgery     reports that she has quit smoking. She has never used smokeless tobacco. She reports that she does not drink alcohol or use illicit drugs. family history includes Arthritis in an unspecified family member; Colon polyps in an unspecified family member; Diabetes in her father; Heart disease in her father and mother; Kidney disease in an unspecified family member; and Lung cancer in an unspecified family member.  There is no history of Colon cancer. Allergies  Allergen Reactions  . Sulfonamide Derivatives          Review of Systems: Denies abdominal pain change in bowel habits shortness of breath or chest pain  The remainder of the 10  point ROS is negative except as outlined in H&P   Physical Exam: General appearance  Well developed, in no distress, overweight. Eyes- non icteric. HEENT nontraumatic, normocephalic. Mouth no lesions, tongue papillated, no cheilosis. Neck supple without adenopathy, thyroid not enlarged, no carotid bruits, no JVD. Lungs Clear to auscultation bilaterally. Cor normal S1 normal S2, regular rhythm , no murmur,  quiet precordium. Abdomen protuberant abdomen with liver edge 2 cm below right costal margin. Slightly tender. Splenic tip not palpable. No ascites. Lower abdomen unremarkable . Rectal: Extremities 1+ pedal edema. Skin no lesions, no spider nevi. , No asterixis Neurological alert and oriented x 3. Psychological normal mood and affect.  Assessment and Plan:  Problem #1 Patient has chronic elevation of liver enzymes likely related to fatty liver. We need to rule out steatohepatitis. As of the last tests in 2010, she had normal synthetic function of the liver. We will repeat the upper abdominal ultrasound. I have talked about the possibility of steatohepatitis which could lead to cirrhosis. For that reason, it would be helpful to obtain a liver biopsy to confirm the diagnosis and to rule out the possibility of early cirrhosis. She agrees to the plan.   09/05/2010 [name]

## 2010-09-05 NOTE — Patient Instructions (Addendum)
You have been scheduled for an abdominal ultrasound at [facility] on 09/11/10 Tuesday @ 9:00 am. You should arrive at 8:45 am for registration. Make certain not to have anything to eat or drink 6 hours prior to your test. We will contact you about your Liver biopsy once your records have been reviewed and the radiologist gives [place] an appointment date and time CC: Dr Collins Scotland

## 2010-09-11 ENCOUNTER — Ambulatory Visit (HOSPITAL_COMMUNITY)
Admission: RE | Admit: 2010-09-11 | Discharge: 2010-09-11 | Disposition: A | Discharge status: Home / Self Care | Payer: Medicare Other | Source: Ambulatory Visit | Attending: Internal Medicine | Admitting: Internal Medicine

## 2010-09-11 DIAGNOSIS — R160 Hepatomegaly, not elsewhere classified: Secondary | ICD-10-CM | POA: Insufficient documentation

## 2010-09-11 DIAGNOSIS — K7689 Other specified diseases of liver: Secondary | ICD-10-CM | POA: Insufficient documentation

## 2010-09-12 ENCOUNTER — Telehealth: Payer: Self-pay

## 2010-09-12 NOTE — Telephone Encounter (Signed)
There is no change in our plans for liver biopsy. All we have discussed during her last office visit still  Applies.The purpose  Of the liver biopsy is to r/o steatohepatitis.

## 2010-09-12 NOTE — Telephone Encounter (Signed)
Message copied by Michele Mcalpine on Wed Sep 12, 2010  8:20 AM ------      Message from: Hart Carwin      Created: Tue Sep 11, 2010  5:24 PM       Please call pt with normal upper abd. ultrasound

## 2010-09-12 NOTE — Telephone Encounter (Signed)
Pt aware.

## 2010-09-12 NOTE — Telephone Encounter (Signed)
Spoke with pt and let her know the results per Dr. Juanda Chance. Pt wants to know if she still needs to have the liver biopsy that is scheduled for next week. Please advise.

## 2010-09-20 ENCOUNTER — Other Ambulatory Visit: Payer: Self-pay | Admitting: Interventional Radiology

## 2010-09-20 ENCOUNTER — Ambulatory Visit (HOSPITAL_COMMUNITY): Payer: Medicare Other

## 2010-09-20 ENCOUNTER — Ambulatory Visit (HOSPITAL_COMMUNITY)
Admission: RE | Admit: 2010-09-20 | Discharge: 2010-09-20 | Disposition: A | Discharge status: Home / Self Care | Payer: Medicare Other | Source: Ambulatory Visit | Attending: Internal Medicine | Admitting: Internal Medicine

## 2010-09-20 ENCOUNTER — Telehealth: Payer: Self-pay | Admitting: *Deleted

## 2010-09-20 DIAGNOSIS — Z79899 Other long term (current) drug therapy: Secondary | ICD-10-CM | POA: Insufficient documentation

## 2010-09-20 DIAGNOSIS — F411 Generalized anxiety disorder: Secondary | ICD-10-CM | POA: Insufficient documentation

## 2010-09-20 DIAGNOSIS — R7989 Other specified abnormal findings of blood chemistry: Secondary | ICD-10-CM

## 2010-09-20 DIAGNOSIS — I10 Essential (primary) hypertension: Secondary | ICD-10-CM | POA: Insufficient documentation

## 2010-09-20 DIAGNOSIS — R945 Abnormal results of liver function studies: Secondary | ICD-10-CM | POA: Insufficient documentation

## 2010-09-20 DIAGNOSIS — K589 Irritable bowel syndrome without diarrhea: Secondary | ICD-10-CM | POA: Insufficient documentation

## 2010-09-20 DIAGNOSIS — M1990 Unspecified osteoarthritis, unspecified site: Secondary | ICD-10-CM | POA: Insufficient documentation

## 2010-09-20 DIAGNOSIS — R160 Hepatomegaly, not elsewhere classified: Secondary | ICD-10-CM

## 2010-09-20 DIAGNOSIS — K5730 Diverticulosis of large intestine without perforation or abscess without bleeding: Secondary | ICD-10-CM | POA: Insufficient documentation

## 2010-09-20 LAB — CBC
Platelets: 234 10*3/uL (ref 150–400)
RBC: 4.73 MIL/uL (ref 3.87–5.11)
RDW: 13.5 % (ref 11.5–15.5)
WBC: 8.5 10*3/uL (ref 4.0–10.5)

## 2010-09-20 LAB — PROTIME-INR
INR: 1.04 (ref 0.00–1.49)
Prothrombin Time: 13.8 seconds (ref 11.6–15.2)

## 2010-09-20 LAB — APTT: aPTT: 31 seconds (ref 24–37)

## 2010-09-20 NOTE — Telephone Encounter (Signed)
Message copied by Daphine Deutscher on Thu Sep 20, 2010  2:01 PM ------      Message from: Hart Carwin      Created: Thu Sep 20, 2010  1:26 PM       Please call pt with normal blood count

## 2010-09-20 NOTE — Telephone Encounter (Signed)
Spoke with patient's husband and she will not be available until tomorrow. Will call patient tomorrow.

## 2010-09-21 NOTE — Telephone Encounter (Signed)
Patient notified of results as per Dr. Brodie. 

## 2010-10-10 ENCOUNTER — Telehealth: Payer: Self-pay | Admitting: Internal Medicine

## 2010-10-10 NOTE — Telephone Encounter (Signed)
Informed pt per Dr Juanda Chance, she has Fatty Liver disease, but she does not have Cirrhosis per the path report. Dr Juanda Chance would be glad to discuss the disease with her at an OV. Pt or daughter will call back to schedule an OV.

## 2010-10-10 NOTE — Telephone Encounter (Signed)
Notified pt I will have Dr Juanda Chance look at the Liver Biopsy results from 09/20/10. Dr Juanda Chance will have to interpret; pt stated understanding. Dr Juanda Chance please advise. Thanks.

## 2012-03-18 HISTORY — PX: OTHER SURGICAL HISTORY: SHX169

## 2012-07-13 ENCOUNTER — Encounter: Payer: Self-pay | Admitting: *Deleted

## 2012-08-03 ENCOUNTER — Encounter: Payer: Self-pay | Admitting: Internal Medicine

## 2012-08-03 ENCOUNTER — Ambulatory Visit (INDEPENDENT_AMBULATORY_CARE_PROVIDER_SITE_OTHER): Payer: Medicare Other | Admitting: Internal Medicine

## 2012-08-03 ENCOUNTER — Other Ambulatory Visit (INDEPENDENT_AMBULATORY_CARE_PROVIDER_SITE_OTHER): Payer: Medicare Other

## 2012-08-03 VITALS — BP 128/60 | HR 80 | Ht 63.5 in | Wt 203.6 lb

## 2012-08-03 DIAGNOSIS — R799 Abnormal finding of blood chemistry, unspecified: Secondary | ICD-10-CM

## 2012-08-03 DIAGNOSIS — K253 Acute gastric ulcer without hemorrhage or perforation: Secondary | ICD-10-CM

## 2012-08-03 DIAGNOSIS — R6889 Other general symptoms and signs: Secondary | ICD-10-CM

## 2012-08-03 DIAGNOSIS — R7989 Other specified abnormal findings of blood chemistry: Secondary | ICD-10-CM

## 2012-08-03 DIAGNOSIS — R933 Abnormal findings on diagnostic imaging of other parts of digestive tract: Secondary | ICD-10-CM

## 2012-08-03 DIAGNOSIS — R935 Abnormal findings on diagnostic imaging of other abdominal regions, including retroperitoneum: Secondary | ICD-10-CM

## 2012-08-03 LAB — PROTIME-INR
INR: 1.1 ratio — ABNORMAL HIGH (ref 0.8–1.0)
Prothrombin Time: 12 s (ref 10.2–12.4)

## 2012-08-03 LAB — AMMONIA: Ammonia: 27 umol/L (ref 11–35)

## 2012-08-03 LAB — HEPATIC FUNCTION PANEL
Albumin: 3.7 g/dL (ref 3.5–5.2)
Total Bilirubin: 0.3 mg/dL (ref 0.3–1.2)

## 2012-08-03 MED ORDER — FAMOTIDINE 40 MG PO TABS: 40.0000 mg | ORAL_TABLET | Freq: Every day | ORAL | Status: DC

## 2012-08-03 NOTE — Patient Instructions (Addendum)
Your physician has requested that you go to the basement for lab work before leaving today.  We have sent the following medications to your pharmacy for you to pick up at your convenience:  Pepcid  You are scheduled for a recall colonoscopy in 01/2013.  You will be contacted around that time to remind you to schedule it    cc:Dr Herb Grays

## 2012-08-03 NOTE — Progress Notes (Signed)
[name] 1929-08-07 MRN [id number]        History of Present Illness:  This is a 79 year old white female with fatty liver and mild steatohepatitis. She has chronically abnormal liver function tests. Last set in April 2014 showed AST of 99 ALT 82. Upper abdominal ultrasound shows mild hepatomegaly, hepatopetal blood flow. Common bile duct 4-7 mm. She complains of bloating and indigestion. Patient underwent evaluation of abnormal liver function tests in October 2010 which included a CT scan of the abdomen which confirmed fatty liver but no evidence of  portal hypertension. Liver biopsy in July 2012 showed mixed micro-and macro vesicular fat with associated inflammation, consistent with mild steatohepatitis. There were rare Councilman bodies. There was no fibrosis on trichrome stain. Patient denies peripheral edema, mental changes, jaundice, pruritus or rectal bleeding. She will be due for recall colonoscopy in November 2014 because of adenomatous polyp found on last colonoscopy in 2009 .She was on cholesterol lowering agent which was discontinued because of abnormal liver function tests. There is no history of alcohol abuse   Past Medical History  Diagnosis Date  . Anxiety disorder   . Arthritis   . Asthma   . Hx of adenomatous colonic polyps   . Fibromyalgia   . Hyperlipidemia   . Hypertension   . IBS (irritable bowel syndrome)   . Peptic ulcer disease   . Fatty liver   . Abnormal LFTs (liver function tests)   . Diverticulosis   . Depression   . Abdominal bloating    Past Surgical History  Procedure Laterality Date  . Abdominal hysterectomy    . Thyroidectomy    . Nasal septum surgery    . Eye surgery Right 2006  . Back surgery  2005    lower back  . Right shoulder skin cancer excision  2012  . Right leg skin cancer surgery  2014  . Right knee surgery  2006    arthroscopy    reports that she has quit smoking. She has never used smokeless tobacco. She reports that she  does not drink alcohol or use illicit drugs. family history includes Arthritis in an unspecified family member; Colon polyps in an unspecified family member; Diabetes in her father; Heart disease in her father and mother; Kidney disease in an unspecified family member; and Lung cancer in an unspecified family member.  There is no history of Colon cancer. Allergies  Allergen Reactions  . Sulfonamide Derivatives         Review of Systems: Occasional dyspepsia and bloating  The remainder of the 10 point ROS is negative except as outlined in H&P   Physical Exam: General appearance  Well developed, in no distress. Overweight Eyes- non icteric. HEENT nontraumatic, normocephalic. Mouth no lesions, tongue papillated, no cheilosis. Neck supple without adenopathy, thyroid not enlarged, no carotid bruits, no JVD. Lungs Clear to auscultation bilaterally. Occasional expiratory wheezes Cor normal S1, normal S2, regular rhythm, no murmur,  quiet precordium. Abdomen: Large protuberant with mild tenderness at  costal margin with the liver edge extending below costal margin and into left upper quadrant. There is no ascites Rectal: Not done Extremities no pedal edema. Skin no lesions. Neurological alert and oriented x 3. No asterixis Psychological normal mood and affect.  Assessment and Plan  79 year old white female with the mild steatohepatitis confirmed on liver biopsy in 2010. No evidence of fibrosis or portal hypertension. We will recheck her synthetic function which will include prothrombin time, ammonia, alpha-fetoprotein and serum albumin. It is  okay for her to return on  cholesterol lowering agents providing her transaminases don't exceed 3 times normal value which would be between 150-200. Her liver function tests should be checked every 3 months while on statins. I suggest repeat upper abdominal ultrasound in 2 years. I have emphasized importance of weight loss. Unfortunately she has  neuropathy and cannot exercise  Colorectal screening. History of adenomatous polyp in 2009. She will be due for recall colonoscopy in November 2014 providing her health remains stable  Dyspepsia. We will send Pepcid 40 mg to take daily or when necessary indigestion   08/03/2012 [name]

## 2012-08-05 ENCOUNTER — Other Ambulatory Visit: Payer: Self-pay | Admitting: *Deleted

## 2012-08-05 DIAGNOSIS — R945 Abnormal results of liver function studies: Secondary | ICD-10-CM

## 2012-09-15 ENCOUNTER — Emergency Department (HOSPITAL_COMMUNITY): Payer: Medicare Other

## 2012-09-15 ENCOUNTER — Encounter (HOSPITAL_COMMUNITY): Payer: Self-pay | Admitting: *Deleted

## 2012-09-15 ENCOUNTER — Emergency Department (HOSPITAL_COMMUNITY)
Admission: EM | Admit: 2012-09-15 | Discharge: 2012-09-15 | Disposition: A | Discharge status: Home / Self Care | Payer: Medicare Other | Attending: Emergency Medicine | Admitting: Emergency Medicine

## 2012-09-15 DIAGNOSIS — I10 Essential (primary) hypertension: Secondary | ICD-10-CM | POA: Insufficient documentation

## 2012-09-15 DIAGNOSIS — Z860100 Personal history of colon polyps, unspecified: Secondary | ICD-10-CM | POA: Insufficient documentation

## 2012-09-15 DIAGNOSIS — R05 Cough: Secondary | ICD-10-CM | POA: Insufficient documentation

## 2012-09-15 DIAGNOSIS — R0989 Other specified symptoms and signs involving the circulatory and respiratory systems: Secondary | ICD-10-CM | POA: Insufficient documentation

## 2012-09-15 DIAGNOSIS — J40 Bronchitis, not specified as acute or chronic: Secondary | ICD-10-CM

## 2012-09-15 DIAGNOSIS — IMO0002 Reserved for concepts with insufficient information to code with codable children: Secondary | ICD-10-CM | POA: Insufficient documentation

## 2012-09-15 DIAGNOSIS — J45909 Unspecified asthma, uncomplicated: Secondary | ICD-10-CM | POA: Insufficient documentation

## 2012-09-15 DIAGNOSIS — M129 Arthropathy, unspecified: Secondary | ICD-10-CM | POA: Insufficient documentation

## 2012-09-15 DIAGNOSIS — Z8719 Personal history of other diseases of the digestive system: Secondary | ICD-10-CM | POA: Insufficient documentation

## 2012-09-15 DIAGNOSIS — Z79899 Other long term (current) drug therapy: Secondary | ICD-10-CM | POA: Insufficient documentation

## 2012-09-15 DIAGNOSIS — Z7982 Long term (current) use of aspirin: Secondary | ICD-10-CM | POA: Insufficient documentation

## 2012-09-15 DIAGNOSIS — F411 Generalized anxiety disorder: Secondary | ICD-10-CM | POA: Insufficient documentation

## 2012-09-15 DIAGNOSIS — IMO0001 Reserved for inherently not codable concepts without codable children: Secondary | ICD-10-CM | POA: Insufficient documentation

## 2012-09-15 DIAGNOSIS — F3289 Other specified depressive episodes: Secondary | ICD-10-CM | POA: Insufficient documentation

## 2012-09-15 DIAGNOSIS — Z8639 Personal history of other endocrine, nutritional and metabolic disease: Secondary | ICD-10-CM | POA: Insufficient documentation

## 2012-09-15 DIAGNOSIS — Z87891 Personal history of nicotine dependence: Secondary | ICD-10-CM | POA: Insufficient documentation

## 2012-09-15 DIAGNOSIS — F329 Major depressive disorder, single episode, unspecified: Secondary | ICD-10-CM | POA: Insufficient documentation

## 2012-09-15 DIAGNOSIS — Z862 Personal history of diseases of the blood and blood-forming organs and certain disorders involving the immune mechanism: Secondary | ICD-10-CM | POA: Insufficient documentation

## 2012-09-15 DIAGNOSIS — R059 Cough, unspecified: Secondary | ICD-10-CM | POA: Insufficient documentation

## 2012-09-15 DIAGNOSIS — K279 Peptic ulcer, site unspecified, unspecified as acute or chronic, without hemorrhage or perforation: Secondary | ICD-10-CM | POA: Insufficient documentation

## 2012-09-15 DIAGNOSIS — K589 Irritable bowel syndrome without diarrhea: Secondary | ICD-10-CM | POA: Insufficient documentation

## 2012-09-15 LAB — CBC
HCT: 45 % (ref 36.0–46.0)
MCHC: 32.7 g/dL (ref 30.0–36.0)
MCV: 89.8 fL (ref 78.0–100.0)
Platelets: 302 10*3/uL (ref 150–400)
RDW: 13.6 % (ref 11.5–15.5)
WBC: 10.6 10*3/uL — ABNORMAL HIGH (ref 4.0–10.5)

## 2012-09-15 LAB — PRO B NATRIURETIC PEPTIDE: Pro B Natriuretic peptide (BNP): 166 pg/mL (ref 0–450)

## 2012-09-15 LAB — BASIC METABOLIC PANEL
BUN: 15 mg/dL (ref 6–23)
Chloride: 101 mEq/L (ref 96–112)
Creatinine, Ser: 0.79 mg/dL (ref 0.50–1.10)
GFR calc Af Amer: 88 mL/min — ABNORMAL LOW (ref >90)
GFR calc non Af Amer: 76 mL/min — ABNORMAL LOW (ref >90)
Potassium: 3.8 mEq/L (ref 3.5–5.1)

## 2012-09-15 MED ORDER — AZITHROMYCIN 250 MG PO TABS: 250.0000 mg | ORAL_TABLET | Freq: Every day | ORAL | Status: DC

## 2012-09-15 MED ORDER — ALBUTEROL SULFATE HFA 108 (90 BASE) MCG/ACT IN AERS: 1.0000 | INHALATION_SPRAY | Freq: Four times a day (QID) | RESPIRATORY_TRACT | Status: DC | PRN

## 2012-09-15 MED ORDER — PREDNISONE 20 MG PO TABS
60.0000 mg | ORAL_TABLET | Freq: Once | ORAL | Status: AC
  Administered 2012-09-15: 60 mg via ORAL
  Filled 2012-09-15: qty 3

## 2012-09-15 MED ORDER — PREDNISONE 10 MG PO TABS: 40.0000 mg | ORAL_TABLET | Freq: Every day | ORAL | Status: DC

## 2012-09-15 NOTE — ED Provider Notes (Signed)
History    CSN: [id number] Arrival date & time 09/15/12  1110  First MD Initiated Contact with Patient 09/15/12 1137     Chief Complaint  Patient presents with  . Shortness of Breath  . Cough   (Consider location/radiation/quality/duration/timing/severity/associated sxs/prior Treatment) HPI Comments: Pt state that [facility] her [facility]2 sats were 89%:pt states that she was sent for further evaluation:pt states that since the neb she is feeling slightly better:pt states that she does have a history of bronchitis and asthma, but no copd:pt states that she has been using her inhaler at home without much relief  Patient is a 79 y.o. female presenting with shortness of breath and cough. The history is provided by the patient. No language interpreter was used.  Shortness of Breath Severity:  Severe Onset quality:  Gradual Duration:  2 weeks Timing:  Constant Progression:  Worsening Relieved by:  Inhaler Worsened by:  Nothing tried Associated symptoms: cough   Associated symptoms: no fever and no vomiting   Cough Associated symptoms: shortness of breath   Associated symptoms: no fever    Past Medical History  Diagnosis Date  . Anxiety disorder   . Arthritis   . Asthma   . Hx of adenomatous colonic polyps   . Fibromyalgia   . Hyperlipidemia   . Hypertension   . IBS (irritable bowel syndrome)   . Peptic ulcer disease   . Fatty liver   . Abnormal LFTs (liver function tests)   . Diverticulosis   . Depression   . Abdominal bloating    Past Surgical History  Procedure Laterality Date  . Abdominal hysterectomy    . Thyroidectomy    . Nasal septum surgery    . Eye surgery Right 2006  . Back surgery  2005    lower back  . Right shoulder skin cancer excision  2012  . Right leg skin cancer surgery  2014  . Right knee surgery  2006    arthroscopy   Family History  Problem Relation Age of Onset  . Colon polyps      aunt  . Diabetes Father    . Heart disease Mother   . Heart disease Father   . Colon cancer Neg Hx   . Kidney disease      niece  . Lung cancer    . Arthritis     History  Substance Use Topics  . Smoking status: Former Games developer  . Smokeless tobacco: Never Used  . Alcohol Use: No   OB History   Grav Para Term Preterm Abortions TAB SAB Ect Mult Living                 Review of Systems  Constitutional: Negative for fever.  Respiratory: Positive for cough and shortness of breath.   Cardiovascular: Negative.   Gastrointestinal: Negative for vomiting.    Allergies  Sulfonamide derivatives  Home Medications   Current Outpatient Rx  Name  Route  Sig  Dispense  Refill  . albuterol (PROVENTIL,VENTOLIN) 90 MCG/ACT inhaler   Inhalation   Inhale 2 puffs into the lungs every 6 (six) hours as needed.           . Alum & Mag Hydroxide-Simeth (MAGIC MOUTHWASH) SOLN   Oral   Take by mouth. As directed          . amLODipine-olmesartan (AZOR) 10-40 MG per tablet   Oral   Take 1 tablet by mouth daily.           Marland Kitchen  aspirin 81 MG tablet   Oral   Take 81 mg by mouth daily.           . diazepam (VALIUM) 5 MG tablet      Take 1/2 tablet by mouth twice daily as needed for back spasms          . famotidine (PEPCID) 40 MG tablet   Oral   Take 1 tablet (40 mg total) by mouth daily.   30 tablet   6   . Fexofenadine HCl (ALLEGRA ALLERGY PO)   Oral   Take by mouth. 24 hour take one daily         . fish oil-omega-3 fatty acids 1000 MG capsule   Oral   Take 2 g by mouth daily.           Marland Kitchen FLUoxetine (PROZAC) 20 MG capsule      Take 3 tablets by mouth once daily          . Fluticasone-Salmeterol (ADVAIR DISKUS) 250-50 MCG/DOSE AEPB   Inhalation   Inhale 1 puff into the lungs 2 (two) times daily.           Marland Kitchen ketotifen (ZADITOR) 0.025 % ophthalmic solution   Both Eyes   Place 1 drop into both eyes 2 (two) times daily.           . NON FORMULARY      Allergy injections every other  day. Almost up to maintenance level.         Marland Kitchen oxycodone (OXY-IR) 5 MG capsule      Take 1/2-1 tablet by mouth as needed for severe back pain          . Polyethyl Glycol-Propyl Glycol (SYSTANE) 0.4-0.3 % SOLN   Ophthalmic   Apply to eye.           . psyllium (METAMUCIL) 58.6 % powder      Take twice daily          . rOPINIRole (REQUIP) 0.25 MG tablet   Oral   Take 0.25 mg by mouth at bedtime.            BP 149/60  Pulse 96  Temp(Src) 98.2 F (36.8 C) (Oral)  Resp 22  SpO2 97% Physical Exam  Nursing note and vitals reviewed. Constitutional: She is oriented to person, place, and time. She appears well-developed and well-nourished.  HENT:  Head: Normocephalic and atraumatic.  Eyes: Conjunctivae and EOM are normal.  Neck: Normal range of motion. Neck supple.  Cardiovascular: Normal rate and regular rhythm.   Pulmonary/Chest: Effort normal. She has rales.  Abdominal: Soft. Bowel sounds are normal.  Musculoskeletal: Normal range of motion.  Neurological: She is alert and oriented to person, place, and time.  Skin: Skin is warm and dry.  Psychiatric: She has a normal mood and affect.    ED Course  Procedures (including critical care time) Labs Reviewed  CBC - Abnormal; Notable for the following:    WBC 10.6 (*)    All other components within normal limits  BASIC METABOLIC PANEL - Abnormal; Notable for the following:    GFR calc non Af Amer 76 (*)    GFR calc Af Amer 88 (*)    All other components within normal limits  PRO B NATRIURETIC PEPTIDE  POCT I-STAT TROPONIN I   Dg Chest 2 View  09/15/2012   *RADIOLOGY REPORT*  Clinical Data: Shortness of breath, cough  CHEST - 2 VIEW  Comparison: 03/06/2004  Findings: Cardiomediastinal silhouette is stable.  No acute infiltrate or pleural effusion.  No pulmonary edema.  Bony thorax is unremarkable.  IMPRESSION: No active disease.  No significant change.   Original Report Authenticated By: Natasha Mead, M.D.    Date:  09/15/2012  Rate: 93  Rhythm: normal sinus rhythm  QRS Axis: low voltage  Intervals: normal  ST/T Wave abnormalities: nonspecific ST/T changes  Conduction Disutrbances:none  Narrative Interpretation:   Old EKG Reviewed: none available   1. Bronchitis     MDM  Pt is feeling better at this time:will treat at home for bronchitis;doubt related to cardiac:pt is feeling better at this time:don't think pt needs to be admitted:pt is out of inhaler:instructed pt on using that over the next couple of days consistent as prescribed:pt in no distress at this time   Teressa Lower, NP 09/15/12 1446

## 2012-09-15 NOTE — ED Provider Notes (Signed)
Medical screening examination/treatment/procedure(s) were performed by non-physician practitioner and as supervising physician I was immediately available for consultation/collaboration.    Nelia Shi, MD 09/15/12 1500

## 2012-09-15 NOTE — ED Notes (Signed)
Pt was at MD office and sats were 89% RA and pt was given a nebulizer and sent here for further work up.  Pt reports coughing

## 2012-10-14 ENCOUNTER — Ambulatory Visit (INDEPENDENT_AMBULATORY_CARE_PROVIDER_SITE_OTHER): Payer: Medicare Other | Admitting: Internal Medicine

## 2012-10-14 ENCOUNTER — Encounter: Payer: Self-pay | Admitting: Internal Medicine

## 2012-10-14 VITALS — BP 138/76 | HR 92 | Temp 97.9°F | Ht 63.25 in | Wt 203.0 lb

## 2012-10-14 DIAGNOSIS — R0609 Other forms of dyspnea: Secondary | ICD-10-CM

## 2012-10-14 DIAGNOSIS — R0600 Dyspnea, unspecified: Secondary | ICD-10-CM

## 2012-10-14 MED ORDER — ESOMEPRAZOLE MAGNESIUM 40 MG PO CPDR: DELAYED_RELEASE_CAPSULE | ORAL | Status: DC

## 2012-10-14 MED ORDER — FAMOTIDINE 20 MG PO TABS: 20.0000 mg | ORAL_TABLET | Freq: Every day | ORAL | Status: DC

## 2012-10-14 NOTE — Patient Instructions (Addendum)
Only use your albuterol (proventil) as a rescue medication to be used if you can't catch your breath by resting or doing a relaxed purse lip breathing pattern. The less you use it, the better it will work when you need it.  Ok to use it up to 2 puffs every 4hours if needed  Take  Nexium 40 mg Take 30-60 min before first meal of the day and Pepcid ac 20 mg one at bedtime  GERD (REFLUX)  is an extremely common cause of respiratory symptoms, many times with no significant heartburn at all.    It can be treated with medication, but also with lifestyle changes including avoidance of late meals, excessive alcohol, smoking cessation, and avoid fatty foods, chocolate, peppermint, colas, red wine, and acidic juices such as orange juice.  NO MINT OR MENTHOL PRODUCTS SO NO COUGH DROPS  USE SUGARLESS CANDY INSTEAD (jolley ranchers or Stover's)  NO OIL BASED VITAMINS - No fish oil use powdered substitutes.  See Tammy NP w/in 2 weeks with all your medications, even over the counter meds, separated in two separate bags, the ones you take no matter what those are the ones in the pill box vs the ones you stop once you feel better and take only as needed when you feel you need them.   Tammy  will generate for you a new user friendly medication calendar that will put [place] all on the same page re: your medication use.     Without this process, it simply isn't possible to assure that we are providing  your outpatient care  with  the attention to detail we feel you deserve.   If we cannot assure that you're getting that kind of care,  then we cannot manage your problem effectively from this clinic.  Once you have seen Tammy and we are sure that we're all on the same page with your medication use she will arrange follow up with me.

## 2012-10-14 NOTE — Progress Notes (Signed)
  Subjective:    Patient ID: [name], female    DOB: 1930/01/16   MRN: [id number]  HPI  58 yowf never smoker no resp or allergy problems until 2011 onset referred by Dr Collins [place]  Pulmonary clinic for cough and sob  10/14/2012 1st pulmonary eval cc variable doe x 3 years  assoc mostly dry cough now daily x across the house much better when on prednisone last took July 14th off advair due to mouth irritation but not clear it really helped the breathing that much, assoc with overt HB and chest discomfort at rest and hs but not with ex.albuterol helps some  No obvious daytime variabilty or assoc  or cp  subjective wheeze overt sinus  symptoms. No unusual exp hx or h/o childhood pna/ asthma or knowledge of premature birth.     Also denies any obvious fluctuation of symptoms with weather or environmental changes or other aggravating or alleviating factors except as outlined above    Review of Systems  Constitutional: Negative for fever, chills and unexpected weight change.  HENT: Positive for ear pain, sneezing and trouble swallowing. Negative for nosebleeds, congestion, sore throat, rhinorrhea, dental problem, voice change, postnasal drip and sinus pressure.   Eyes: Negative for visual disturbance.  Respiratory: Positive for cough and shortness of breath. Negative for choking.   Cardiovascular: Positive for chest pain and leg swelling.  Gastrointestinal: Negative for vomiting, abdominal pain and diarrhea.  Genitourinary: Negative for difficulty urinating.  Musculoskeletal: Positive for arthralgias.  Skin: Negative for rash.  Neurological: Negative for tremors, syncope and headaches.  Hematological: Does not bruise/bleed easily.       Objective:   Physical Exam Wt Readings from Last 3 Encounters:  10/14/12 203 lb (92.08 kg)  08/03/12 203 lb 9.6 oz (92.352 kg)  09/05/10 198 lb (89.812 kg)    Obese mod hoarse wf with mild pseudowheeze  HEENT: nl dentition, turbinates, and orophanx. Nl  external ear canals without cough reflex   NECK :  without JVD/Nodes/TM/ nl carotid upstrokes bilaterally   LUNGS: no acc muscle use, clear to A and P bilaterally without cough on insp or exp maneuvers   CV:  RRR  no s3 or murmur or increase in P2, slt swelling R leg   ABD:  soft and nontender with nl excursion in the supine position. No bruits or organomegaly, bowel sounds nl  MS:  warm without deformities, calf tenderness, cyanosis or clubbing  SKIN: warm and dry without lesions    NEURO:  alert, approp, no deficits    cxr 09/15/12  No active disease. No significant change.     Assessment & Plan:

## 2012-10-15 DIAGNOSIS — J4540 Moderate persistent asthma, uncomplicated: Secondary | ICD-10-CM | POA: Insufficient documentation

## 2012-10-15 NOTE — Assessment & Plan Note (Signed)
Symptoms are markedly disproportionate to objective findings and not clear this is all a lung problem but pt does appear to have difficult airway management issues.  It is very unusual to see new onset refractory bronchitis or asthma at age 78 in a never smoker with no atopic hx  DDX of  difficult airways managment all start with A and  include Adherence, Ace Inhibitors, Acid Reflux, Active Sinus Disease, Alpha 1 Antitripsin deficiency, Anxiety masquerading as Airways dz,  ABPA,  allergy(esp in young), Aspiration (esp in elderly), Adverse effects of DPI,  Active smokers, plus two Bs  = Bronchiectasis and Beta blocker use..and one C= CHF   Adherence is always the initial "prime suspect" and is a multilayered concern that requires a "trust but verify" approach in every patient - starting with knowing how to use medications, especially inhalers, correctly, keeping up with refills and understanding the fundamental difference between maintenance and prns vs those medications only taken for a very short course and then stopped and not refilled.  To keep things simple, I have asked the patient to first separate medicines that are perceived as maintenance, that is to be taken daily "no matter what", from those medicines that are taken on only on an as-needed basis and I have given the patient examples of both, and then return to see our NP to generate a  detailed  medication calendar which should be followed until the next physician sees the patient and updates it.   The proper method of use, as well as anticipated side effects, of a metered-dose inhaler are discussed and demonstrated to the patient. Improved effectiveness after extensive coaching during this visit to a level of approximately  75% > ok to use albuterol prn  Acid (and non acid ) reflux strongly suspected here > rec max rx and diet, reviewed  ? Adverse effects of dpi > advised to remain off advair for now and if flares again with good control of  gerd rx with either low dose dulera or symbicort if indeed she's mastered hfa  ? Aspiration/ esp in elderly > low threshold for barium swallow if dx  And control of symptoms remain elusive

## 2012-10-29 ENCOUNTER — Encounter: Payer: Self-pay | Admitting: Adult Health

## 2012-10-29 ENCOUNTER — Ambulatory Visit (INDEPENDENT_AMBULATORY_CARE_PROVIDER_SITE_OTHER): Payer: Medicare Other | Admitting: Adult Health

## 2012-10-29 VITALS — BP 102/64 | HR 87 | Temp 98.1°F | Ht 63.25 in | Wt 199.2 lb

## 2012-10-29 DIAGNOSIS — R0600 Dyspnea, unspecified: Secondary | ICD-10-CM

## 2012-10-29 DIAGNOSIS — R0989 Other specified symptoms and signs involving the circulatory and respiratory systems: Secondary | ICD-10-CM

## 2012-10-29 DIAGNOSIS — R0609 Other forms of dyspnea: Secondary | ICD-10-CM

## 2012-10-29 NOTE — Progress Notes (Signed)
  Subjective:    Patient ID: [name], female    DOB: 1930/03/02   MRN: [id number]  HPI  71 yowf never smoker no resp or allergy problems until 2011 onset referred by Dr Collins [place]  Pulmonary clinic for cough and sob  10/14/2012 1st pulmonary eval cc variable doe x 3 years  assoc mostly dry cough now daily x across the house much better when on prednisone last took July 14th off advair due to mouth irritation but not clear it really helped the breathing that much, assoc with overt HB and chest discomfort at rest and hs but not with ex.albuterol helps some  >PPI /Pepcid  D/c fish oil   10/29/2012 follow up and med review  Patient returns for followup and medication review.   reports DOE is 40% improved since last ov.  no new complaints. We reviewed all her medications and organized them into a medication calendar with patient education. Patient appears to be taking her medications correctly. Patient continues to complain of postnasal drip and drainage. Dry cough and dyspnea. Are slightly improved. Patient denies any hemoptysis, orthopnea, PND, or leg swelling  Review of Systems  Constitutional: Negative for fever, chills and unexpected weight change.  HENT: Positive for ear pain, sneezing and trouble swallowing. Negative for nosebleeds, congestion, sore throat, rhinorrhea, dental problem, voice change, postnasal drip and sinus pressure.   Eyes: Negative for visual disturbance.  Respiratory: Positive for cough and shortness of breath. Negative for choking.   Cardiovascular: Positive for chest pain and leg swelling.  Gastrointestinal: Negative for vomiting, abdominal pain and diarrhea.  Genitourinary: Negative for difficulty urinating.  Musculoskeletal: Positive for arthralgias.  Skin: Negative for rash.  Neurological: Negative for tremors, syncope and headaches.  Hematological: Does not bruise/bleed easily.       Objective:   Physical Exam  Obese mod hoarse wf    HEENT: nl  dentition, turbinates, and orophanx. Nl external ear canals without cough reflex   NECK :  without JVD/Nodes/TM/ nl carotid upstrokes bilaterally   LUNGS: no acc muscle use, clear to A and P bilaterally without cough on insp or exp maneuvers   CV:  RRR  no s3 or murmur or increase in P2, slt swelling R leg   ABD:  soft and nontender with nl excursion in the supine position. No bruits or organomegaly, bowel sounds nl  MS:  warm without deformities, calf tenderness, cyanosis or clubbing  SKIN: warm and dry without lesions    NEURO:  alert, approp, no deficits    cxr 09/15/12  No active disease. No significant change.     Assessment & Plan:

## 2012-10-29 NOTE — Patient Instructions (Addendum)
Add Chlortrimeton 4mg  2  At bedtime   Take Allegra 180mg  daily in am .  Increase Astelin 2 puffs Twice daily   May use Mucinex As needed  For congestion/thick mucus May use Delsym 2 tsp Twice daily  For cough As needed   GOAL IS TO NOT COUGH OR CLEAR THROAT.  NO MINTS . GERD DIET Follow med calendar closely and bring to each visit.  Please contact office for sooner follow up if symptoms do not improve or worsen or seek emergency care  follow up Dr. Sherene Sires  In 4 weeks with PFTs.

## 2012-10-29 NOTE — Assessment & Plan Note (Addendum)
Dyspnea and Cough ? Etiology  CXR 09/2012 clear , never smoker  ? GERD/AR /Sinus disease component  Patient's medications were reviewed today and patient education was given. Computerized medication calendar was adjusted/completed  Will need PFT   Plan  Add Chlortrimeton 4mg  2  At bedtime   Take Allegra 180mg  daily in am .  Increase Astelin 2 puffs Twice daily   May use Mucinex As needed  For congestion/thick mucus May use Delsym 2 tsp Twice daily  For cough As needed   GOAL IS TO NOT COUGH OR CLEAR THROAT.  NO MINTS . GERD DIET Follow med calendar closely and bring to each visit.  Please contact office for sooner follow up if symptoms do not improve or worsen or seek emergency care  follow up Dr. Sherene Sires  In 4 weeks with PFTs.

## 2012-11-03 NOTE — Addendum Note (Signed)
Addended by: Boone Master E on: 11/03/2012 05:05 PM   Modules accepted: Orders

## 2012-11-09 ENCOUNTER — Telehealth: Payer: Self-pay | Admitting: *Deleted

## 2012-11-09 NOTE — Telephone Encounter (Signed)
Spoke with patient's daughter and she will have these labs done on 11/27/12 when she is here for an appointment with Dr. Sherene Sires.

## 2012-11-09 NOTE — Telephone Encounter (Signed)
Message copied by Daphine Deutscher on Mon Nov 09, 2012  9:00 AM ------      Message from: Daphine Deutscher      Created: Wed Aug 05, 2012  9:17 AM       Call and remind due for LFt on 11/09/12 DB ------

## 2012-11-23 ENCOUNTER — Encounter: Payer: Self-pay | Admitting: Internal Medicine

## 2012-11-27 ENCOUNTER — Ambulatory Visit (INDEPENDENT_AMBULATORY_CARE_PROVIDER_SITE_OTHER): Payer: Medicare Other | Admitting: Internal Medicine

## 2012-11-27 DIAGNOSIS — R05 Cough: Secondary | ICD-10-CM

## 2012-11-27 DIAGNOSIS — R059 Cough, unspecified: Secondary | ICD-10-CM

## 2012-11-27 LAB — PULMONARY FUNCTION TEST

## 2012-11-27 NOTE — Progress Notes (Signed)
PFT done today. Sricharan Lacomb, CMA  

## 2012-11-30 ENCOUNTER — Other Ambulatory Visit (INDEPENDENT_AMBULATORY_CARE_PROVIDER_SITE_OTHER): Payer: Medicare Other

## 2012-11-30 ENCOUNTER — Encounter: Payer: Self-pay | Admitting: Internal Medicine

## 2012-11-30 ENCOUNTER — Ambulatory Visit (INDEPENDENT_AMBULATORY_CARE_PROVIDER_SITE_OTHER): Payer: Medicare Other | Admitting: Internal Medicine

## 2012-11-30 VITALS — BP 150/74 | HR 84 | Temp 97.9°F | Ht 63.5 in | Wt 197.6 lb

## 2012-11-30 DIAGNOSIS — R0600 Dyspnea, unspecified: Secondary | ICD-10-CM

## 2012-11-30 DIAGNOSIS — R0609 Other forms of dyspnea: Secondary | ICD-10-CM

## 2012-11-30 DIAGNOSIS — R0989 Other specified symptoms and signs involving the circulatory and respiratory systems: Secondary | ICD-10-CM

## 2012-11-30 DIAGNOSIS — R7989 Other specified abnormal findings of blood chemistry: Secondary | ICD-10-CM

## 2012-11-30 DIAGNOSIS — R945 Abnormal results of liver function studies: Secondary | ICD-10-CM

## 2012-11-30 LAB — HEPATIC FUNCTION PANEL
AST: 75 U/L — ABNORMAL HIGH (ref 0–37)
Albumin: 3.9 g/dL (ref 3.5–5.2)
Alkaline Phosphatase: 97 U/L (ref 39–117)
Bilirubin, Direct: 0 mg/dL (ref 0.0–0.3)
Total Protein: 7.1 g/dL (ref 6.0–8.3)

## 2012-11-30 MED ORDER — BUDESONIDE-FORMOTEROL FUMARATE 80-4.5 MCG/ACT IN AERO: 2.0000 | INHALATION_SPRAY | Freq: Two times a day (BID) | RESPIRATORY_TRACT | Status: DC

## 2012-11-30 NOTE — Progress Notes (Signed)
Subjective:    Patient ID: [name], female    DOB: 28-Apr-1929   MRN: [id number]    Brief patient profile:  31 yowf never smoker no resp or allergy problems until 2011 onset referred by Dr Collins [place]  Pulmonary clinic for cough and sob with dx of asthma by pft's 11/30/12   History of Present Illness  10/14/2012 1st pulmonary eval cc variable doe x 3 years  assoc mostly dry cough now daily x across the house much better when on prednisone last took July 14th off advair due to mouth irritation but not clear it really helped the breathing that much, assoc with overt HB and chest discomfort at rest and hs but not with ex.albuterol helps some  >PPI /Pepcid  D/c fish oil   10/29/2012 follow up and med review  Patient returns for followup and medication review.   reports DOE is 40% improved since last ov.  no new complaints. We reviewed all her medications and organized them into a medication calendar with patient education. Patient appears to be taking her medications correctly. Patient continues to complain of postnasal drip and drainage. Dry cough and dyspnea. Are slightly improved. rec Add Chlortrimeton 4mg  2  At bedtime   Take Allegra 180mg  daily in am .  Increase Astelin 2 puffs Twice daily   May use Mucinex As needed  For congestion/thick mucus May use Delsym 2 tsp Twice daily  For cough As needed   GOAL IS TO NOT COUGH OR CLEAR THROAT.  NO MINTS . GERD DIET Follow med calendar closely and bring to each visit  11/30/2012 f/u ov/[name] re cough and sob Chief Complaint  Patient presents with  . Follow-up    pt reports breathing is much better-- denies any new concerns at this time   using lots of saba to control sob and cough mostly dry  No obvious day to day or daytime variabilty or assoc  cp or chest tightness, subjective wheeze overt sinus or hb symptoms. No unusual exp hx or h/o childhood pna/ asthma or knowledge of premature birth.  Sleeping ok without nocturnal  or early am  exacerbation  of respiratory  c/o's or need for noct saba. Also denies any obvious fluctuation of symptoms with weather or environmental changes or other aggravating or alleviating factors except as outlined above   Current Medications, Allergies, Complete Past Medical History, Past Surgical History, Family History, and Social History were reviewed in [employer] record.  ROS  The following are not active complaints unless bolded sore throat, dysphagia, dental problems, itching, sneezing,  nasal congestion or excess/ purulent secretions, ear ache,   fever, chills, sweats, unintended wt loss, pleuritic or exertional cp, hemoptysis,  orthopnea pnd or leg swelling, presyncope, palpitations, heartburn, abdominal pain, anorexia, nausea, vomiting, diarrhea  or change in bowel or urinary habits, change in stools or urine, dysuria,hematuria,  rash, arthralgias, visual complaints, headache, numbness weakness or ataxia or problems with walking or coordination,  change in mood/affect or memory.           Objective:   Physical Exam  Obese mod hoarse wf    HEENT: nl dentition, turbinates, and orophanx. Nl external ear canals without cough reflex   NECK :  without JVD/Nodes/TM/ nl carotid upstrokes bilaterally   LUNGS: no acc muscle use, clear to A and P bilaterally without cough on insp or exp maneuvers   CV:  RRR  no s3 or murmur or increase in P2, slt swelling R leg  ABD:  soft and nontender with nl excursion in the supine position. No bruits or organomegaly, bowel sounds nl  MS:  warm without deformities, calf tenderness, cyanosis or clubbing  SKIN: warm and dry without lesions    NEURO:  alert, approp, no deficits    cxr 09/15/12  No active disease. No significant change.     Assessment & Plan:

## 2012-11-30 NOTE — Patient Instructions (Addendum)
symbicort 80 2 puffs first thing in am and then another 2 puffs about 12 hours later.   Please schedule a follow up office visit in 6 weeks, call sooner if needed

## 2012-12-01 ENCOUNTER — Other Ambulatory Visit: Payer: Self-pay | Admitting: *Deleted

## 2012-12-01 ENCOUNTER — Encounter: Payer: Self-pay | Admitting: Internal Medicine

## 2012-12-01 ENCOUNTER — Telehealth: Payer: Self-pay | Admitting: *Deleted

## 2012-12-01 DIAGNOSIS — R945 Abnormal results of liver function studies: Secondary | ICD-10-CM

## 2012-12-01 NOTE — Telephone Encounter (Signed)
Spoke with patient's daughter and she states her mother is due for screening colonoscopy in Nov. Her mother really does not want to have this. She wants to know if she really needs to do this. Please, advise.

## 2012-12-01 NOTE — Assessment & Plan Note (Signed)
-   hfa 75% p coaching 10/14/12  -med calendar 10/29/2012  - PFT's 11/27/12 FEV1  1.10 ( 59%) ratio 56 and 24 %  Better p B2, dlco 73 corrects to 95  So she clearly has an asthmatic component which may explain most of her symptoms.  The proper method of use, as well as anticipated side effects, of a metered-dose inhaler are discussed and demonstrated to the patient. Improved effectiveness after extensive coaching during this visit to a level of approximately  75% so rx symbicort 80 2bid trial     Each maintenance medication was reviewed in detail including most importantly the difference between maintenance and as needed and under what circumstances the prns are to be used. This was done in the context of a medication calendar review which provided the patient with a user-friendly unambiguous mechanism for medication administration and reconciliation and provides an action plan for all active problems. It is critical that this be shown to every doctor  for modification during the office visit if necessary so the patient can use it as a working document.

## 2012-12-01 NOTE — Telephone Encounter (Signed)
Spoke with patient's daughter and gave her Dr. Regino Schultze recommendation.

## 2012-12-01 NOTE — Telephone Encounter (Signed)
She is 79 yo,  Had adenomatous polyp in 2009. It is the recommendation to have a follow up colon in 5 years, but after age of 79 we individualize case by case depending on pt's general condition .

## 2012-12-02 ENCOUNTER — Other Ambulatory Visit: Payer: Self-pay | Admitting: Internal Medicine

## 2012-12-02 MED ORDER — FLUTICASONE-SALMETEROL 115-21 MCG/ACT IN AERO: 2.0000 | INHALATION_SPRAY | Freq: Two times a day (BID) | RESPIRATORY_TRACT | Status: DC

## 2012-12-10 ENCOUNTER — Telehealth: Payer: Self-pay | Admitting: Internal Medicine

## 2012-12-10 NOTE — Telephone Encounter (Signed)
Patient Instructions    symbicort 80 2 puffs first thing in am and then another 2 puffs about 12 hours later.  Please schedule a follow up office visit in 6 weeks, call sooner if needed    [facility]

## 2012-12-10 NOTE — Telephone Encounter (Signed)
Pt's daughter returned triage's call.  Holly D Pryor ° °

## 2012-12-10 NOTE — Telephone Encounter (Signed)
Daughter states that PA is needed for patient to get Symbicort 80/4.5 2 puffs BID-works better than Advair; PA started via phone and should receive a fax within an hour to have MW fill out and fax back. Will forward to [place] to follow up on.  Case ID # [id number]

## 2012-12-14 NOTE — Telephone Encounter (Signed)
Spoke with patients daughter-- informed her that I will check on status of PA and call her back PA has in fact been Denied-- I have completed form to appeal this denial Form given to [place] to have Dr. Sherene Sires follow up on Will forward to [place] to keep updated on status

## 2012-12-14 NOTE — Telephone Encounter (Signed)
Fax received? Please advise thank you!!

## 2012-12-14 NOTE — Telephone Encounter (Signed)
Received a denial from ins for symbicort 80 We will try again by answering a few questions on the form MW has completed this and I have faxed this back to ins I spoke with the daughter and notified her of this, and I have left samples of symbicort up front pt so she is not out

## 2012-12-14 NOTE — Telephone Encounter (Signed)
Pt's daughter calling again in ref to previous msg can be reached at [phone].[name]

## 2013-01-05 ENCOUNTER — Other Ambulatory Visit: Payer: Self-pay | Admitting: Internal Medicine

## 2013-01-05 DIAGNOSIS — J45909 Unspecified asthma, uncomplicated: Secondary | ICD-10-CM

## 2013-01-11 ENCOUNTER — Encounter: Payer: Self-pay | Admitting: Internal Medicine

## 2013-01-11 ENCOUNTER — Ambulatory Visit (INDEPENDENT_AMBULATORY_CARE_PROVIDER_SITE_OTHER): Payer: Medicare Other | Admitting: Internal Medicine

## 2013-01-11 VITALS — BP 128/80 | HR 83 | Temp 97.9°F | Ht 63.5 in | Wt 199.0 lb

## 2013-01-11 DIAGNOSIS — J45909 Unspecified asthma, uncomplicated: Secondary | ICD-10-CM

## 2013-01-11 MED ORDER — BUDESONIDE-FORMOTEROL FUMARATE 80-4.5 MCG/ACT IN AERO: 2.0000 | INHALATION_SPRAY | Freq: Two times a day (BID) | RESPIRATORY_TRACT | Status: DC

## 2013-01-11 MED ORDER — MOMETASONE FURO-FORMOTEROL FUM 100-5 MCG/ACT IN AERO: INHALATION_SPRAY | RESPIRATORY_TRACT | Status: DC

## 2013-01-11 NOTE — Progress Notes (Signed)
Subjective:    Patient ID: [name], female    DOB: 30-Dec-1929   MRN: [id number]    Brief patient profile:  21 yowf never smoker no resp or allergy problems until 2011 onset referred by Dr Collins [place]  Pulmonary clinic for cough and sob with dx of asthma by pft's 11/30/12   History of Present Illness  10/14/2012 1st pulmonary eval cc variable doe x 3 years  assoc mostly dry cough now daily x across the house much better when on prednisone last took July 14th off advair due to mouth irritation but not clear it really helped the breathing that much, assoc with overt HB and chest discomfort at rest and hs but not with ex.albuterol helps some  >PPI /Pepcid  D/c fish oil   10/29/2012 follow up and med review  Patient returns for followup and medication review.   reports DOE is 40% improved since last ov.  no new complaints. We reviewed all her medications and organized them into a medication calendar with patient education. Patient appears to be taking her medications correctly. Patient continues to complain of postnasal drip and drainage. Dry cough and dyspnea. Are slightly improved. rec Add Chlortrimeton 4mg  2  At bedtime   Take Allegra 180mg  daily in am .  Increase Astelin 2 puffs Twice daily   May use Mucinex As needed  For congestion/thick mucus May use Delsym 2 tsp Twice daily  For cough As needed   GOAL IS TO NOT COUGH OR CLEAR THROAT.  NO MINTS . GERD DIET Follow med calendar closely and bring to each visit  11/30/2012 f/u ov/Lory Nowaczyk re cough and sob Chief Complaint  Patient presents with  . Follow-up    pt reports breathing is much better-- denies any new concerns at this time   using lots of saba to control sob and cough mostly dry rec symbicort 80 2 puffs first thing in am and then another 2 puffs about 12 hours later.    01/11/2013 f/u ov/Bynum Mccullars re: chronic asthma Chief Complaint  Patient presents with  . Follow-up    Breathing improved since last OV.  much better on  symbicort 80 2bid with no limiting sob, cough, no need for saba   No obvious day to day or daytime variabilty or assoc  cp or chest tightness, subjective wheeze overt sinus or hb symptoms. No unusual exp hx or h/o childhood pna/ asthma or knowledge of premature birth.  Sleeping ok without nocturnal  or early am exacerbation  of respiratory  c/o's or need for noct saba. Also denies any obvious fluctuation of symptoms with weather or environmental changes or other aggravating or alleviating factors except as outlined above   Current Medications, Allergies, Complete Past Medical History, Past Surgical History, Family History, and Social History were reviewed in [employer] record.  ROS  The following are not active complaints unless bolded sore throat, dysphagia, dental problems, itching, sneezing,  nasal congestion or excess/ purulent secretions, ear ache,   fever, chills, sweats, unintended wt loss, pleuritic or exertional cp, hemoptysis,  orthopnea pnd or leg swelling, presyncope, palpitations, heartburn, abdominal pain, anorexia, nausea, vomiting, diarrhea  or change in bowel or urinary habits, change in stools or urine, dysuria,hematuria,  rash, arthralgias, visual complaints, headache, numbness weakness or ataxia or problems with walking or coordination,  change in mood/affect or memory.           Objective:   Physical Exam  Obese minimally hoarse amb wf nad  Wt  Readings from Last 3 Encounters:  01/11/13 199 lb (90.266 kg)  11/30/12 197 lb 9.6 oz (89.631 kg)  10/29/12 199 lb 3.2 oz (90.357 kg)      HEENT: nl dentition, turbinates, and orophanx. Nl external ear canals without cough reflex   NECK :  without JVD/Nodes/TM/ nl carotid upstrokes bilaterally   LUNGS: no acc muscle use, clear to A and P bilaterally without cough on insp or exp maneuvers   CV:  RRR  no s3 or murmur or increase in P2, slt swelling R leg   ABD:  soft and nontender with nl excursion  in the supine position. No bruits or organomegaly, bowel sounds nl  MS:  warm without deformities, calf tenderness, cyanosis or clubbing  SKIN: warm and dry without lesions    NEURO:  alert, approp, no deficits    cxr 09/15/12  No active disease. No significant change.     Assessment & Plan:   Outpatient Encounter Prescriptions as of 01/11/2013  Medication Sig Dispense Refill  . albuterol (PROVENTIL,VENTOLIN) 90 MCG/ACT inhaler Inhale 2 puffs into the lungs every 4 (four) hours as needed for wheezing or shortness of breath.       Marland Kitchen amLODipine-olmesartan (AZOR) 10-40 MG per tablet Take 1 tablet by mouth at bedtime.      Marland Kitchen antiseptic oral rinse (BIOTENE) LIQD 15 mLs by Mouth Rinse route as needed.      Marland Kitchen aspirin 81 MG tablet Take 81 mg by mouth daily.        Marland Kitchen azelastine (ASTELIN) 137 MCG/SPRAY nasal spray Place 2 sprays into the nose 2 (two) times daily. Use in each nostril as directed      . budesonide-formoterol (SYMBICORT) 80-4.5 MCG/ACT inhaler Inhale 2 puffs into the lungs 2 (two) times daily.  3 Inhaler  3  . chlorpheniramine (CHLOR-TRIMETON) 4 MG tablet 2 tabs by mouth at bedtime      . dextromethorphan (DELSYM) 30 MG/5ML liquid 2 tsp twice daily as needed for cough      . diazepam (VALIUM) 5 MG tablet Take 1/2 tablet by mouth twice daily as needed for back spasms       . esomeprazole (NEXIUM) 40 MG capsule Take 30-60 min before first meal of the day      . famotidine (PEPCID) 20 MG tablet Take 1 tablet (20 mg total) by mouth at bedtime.  30 tablet  5  . FLUoxetine (PROZAC) 20 MG capsule Take 20 mg by mouth daily.      Marland Kitchen guaiFENesin (MUCINEX) 600 MG 12 hr tablet Take 600 mg by mouth 2 (two) times daily as needed for congestion.      Marland Kitchen oxycodone (OXY-IR) 5 MG capsule Take 1/2-1 tablet by mouth three times daily as needed for severe back pain      . [DISCONTINUED] budesonide-formoterol (SYMBICORT) 80-4.5 MCG/ACT inhaler Inhale 2 puffs into the lungs 2 (two) times daily.      .  [DISCONTINUED] fluticasone-salmeterol (ADVAIR HFA) 115-21 MCG/ACT inhaler Inhale 2 puffs into the lungs 2 (two) times daily.  3 Inhaler  3  . fexofenadine (ALLEGRA) 180 MG tablet Take 180 mg by mouth at bedtime.       . mometasone-formoterol (DULERA) 100-5 MCG/ACT AERO Take 2 puffs first thing in am and then another 2 puffs about 12 hours later.  3 Inhaler  3  . [DISCONTINUED] DULoxetine (CYMBALTA) 30 MG capsule Take 30 mg by mouth every morning.        No facility-administered encounter  medications on file as of 01/11/2013.

## 2013-01-11 NOTE — Assessment & Plan Note (Signed)
-   PFT's 11/27/12 FEV1  1.10 ( 59%) ratio 56 and 24 %  Better p B2, dlco 73 corrects to 95   As long as access to symbicort 80 > All goals of chronic asthma control met including optimal ( though not nl) lung  function and elimination of symptoms with minimal need for rescue therapy (so little that she forgot my rec to "not leave home s it")  Contingencies discussed in full including contacting this office immediately if not controlling the symptoms using the rule of two's.     The proper method of use, as well as anticipated side effects, of a metered-dose inhaler are discussed and demonstrated to the patient. Improved effectiveness after extensive coaching during this visit to a level of approximately  90%  If can't get symbicort 80 through her insurance I would try dulera 100 but these are not the same as the advair hfa which was initially covered.  All pulmonary f/u can be prn

## 2013-01-11 NOTE — Patient Instructions (Addendum)
Either use dulera 100 or symbicort 80 Take 2 puffs first thing in am and then another 2 puffs about 12 hours later.     Only use your albuterol as a rescue medication to be used if you can't catch your breath by resting or doing a relaxed purse lip breathing pattern.  - The less you use it, the better it will work when you need it. - Ok to use up to every 4 hours if you must but call for immediate appointment if use goes up over your usual need - Don't leave home without it !!  (think of it like your spare tire for your car)    If you are satisfied with your treatment plan let your doctor know and he/she can either refill your medications or you can return here when your prescription runs out.     If in any way you are not 100% satisfied,  please tell us.  If 100% better, tell your friends!

## 2013-01-15 ENCOUNTER — Encounter: Payer: Self-pay | Admitting: Internal Medicine

## 2013-02-08 ENCOUNTER — Telehealth: Payer: Self-pay | Admitting: Internal Medicine

## 2013-02-08 DIAGNOSIS — R040 Epistaxis: Secondary | ICD-10-CM

## 2013-02-08 NOTE — Telephone Encounter (Signed)
Ok to refer but first must stop all asa until no blood x 3 days

## 2013-02-08 NOTE — Telephone Encounter (Signed)
Spoke with pt's daughter and she reports that pt has been having trouble with terrible nosebleeds.  She stopped Astelin 2 weeks ago and started using Nasal saline with vaseline inside nose and also using a humidifier.  They want to know if pt should see ENT.  Please advise

## 2013-02-08 NOTE — Telephone Encounter (Signed)
Pt daughter advised. And order placed.Carron Curie, CMA

## 2013-02-09 ENCOUNTER — Encounter (HOSPITAL_COMMUNITY): Payer: Self-pay | Admitting: Emergency Medicine

## 2013-02-09 ENCOUNTER — Emergency Department (HOSPITAL_COMMUNITY)
Admission: EM | Admit: 2013-02-09 | Discharge: 2013-02-09 | Disposition: A | Discharge status: Home / Self Care | Payer: Medicare Other | Attending: Emergency Medicine | Admitting: Emergency Medicine

## 2013-02-09 DIAGNOSIS — R040 Epistaxis: Secondary | ICD-10-CM

## 2013-02-09 DIAGNOSIS — Z79899 Other long term (current) drug therapy: Secondary | ICD-10-CM | POA: Insufficient documentation

## 2013-02-09 DIAGNOSIS — IMO0002 Reserved for concepts with insufficient information to code with codable children: Secondary | ICD-10-CM | POA: Insufficient documentation

## 2013-02-09 DIAGNOSIS — Z8711 Personal history of peptic ulcer disease: Secondary | ICD-10-CM | POA: Insufficient documentation

## 2013-02-09 DIAGNOSIS — F329 Major depressive disorder, single episode, unspecified: Secondary | ICD-10-CM | POA: Insufficient documentation

## 2013-02-09 DIAGNOSIS — D649 Anemia, unspecified: Secondary | ICD-10-CM | POA: Insufficient documentation

## 2013-02-09 DIAGNOSIS — J45909 Unspecified asthma, uncomplicated: Secondary | ICD-10-CM | POA: Insufficient documentation

## 2013-02-09 DIAGNOSIS — Z860100 Personal history of colon polyps, unspecified: Secondary | ICD-10-CM | POA: Insufficient documentation

## 2013-02-09 DIAGNOSIS — F411 Generalized anxiety disorder: Secondary | ICD-10-CM | POA: Insufficient documentation

## 2013-02-09 DIAGNOSIS — IMO0001 Reserved for inherently not codable concepts without codable children: Secondary | ICD-10-CM | POA: Insufficient documentation

## 2013-02-09 DIAGNOSIS — Z8719 Personal history of other diseases of the digestive system: Secondary | ICD-10-CM | POA: Insufficient documentation

## 2013-02-09 DIAGNOSIS — Z8639 Personal history of other endocrine, nutritional and metabolic disease: Secondary | ICD-10-CM | POA: Insufficient documentation

## 2013-02-09 DIAGNOSIS — M129 Arthropathy, unspecified: Secondary | ICD-10-CM | POA: Insufficient documentation

## 2013-02-09 DIAGNOSIS — F3289 Other specified depressive episodes: Secondary | ICD-10-CM | POA: Insufficient documentation

## 2013-02-09 DIAGNOSIS — Z862 Personal history of diseases of the blood and blood-forming organs and certain disorders involving the immune mechanism: Secondary | ICD-10-CM | POA: Insufficient documentation

## 2013-02-09 DIAGNOSIS — Z9889 Other specified postprocedural states: Secondary | ICD-10-CM | POA: Insufficient documentation

## 2013-02-09 DIAGNOSIS — Z7982 Long term (current) use of aspirin: Secondary | ICD-10-CM | POA: Insufficient documentation

## 2013-02-09 LAB — COMPREHENSIVE METABOLIC PANEL
ALT: 76 U/L — ABNORMAL HIGH (ref 0–35)
Alkaline Phosphatase: 103 U/L (ref 39–117)
BUN: 23 mg/dL (ref 6–23)
CO2: 22 mEq/L (ref 19–32)
Chloride: 103 mEq/L (ref 96–112)
GFR calc Af Amer: 53 mL/min — ABNORMAL LOW (ref >90)
GFR calc non Af Amer: 46 mL/min — ABNORMAL LOW (ref >90)
Glucose, Bld: 140 mg/dL — ABNORMAL HIGH (ref 70–99)
Potassium: 4.4 mEq/L (ref 3.5–5.1)
Sodium: 138 mEq/L (ref 135–145)
Total Bilirubin: 0.3 mg/dL (ref 0.3–1.2)

## 2013-02-09 LAB — CBC WITH DIFFERENTIAL/PLATELET
Eosinophils Absolute: 0.1 10*3/uL (ref 0.0–0.7)
Hemoglobin: 10.4 g/dL — ABNORMAL LOW (ref 12.0–15.0)
Lymphocytes Relative: 11 % — ABNORMAL LOW (ref 12–46)
Lymphs Abs: 1.5 10*3/uL (ref 0.7–4.0)
MCH: 28.1 pg (ref 26.0–34.0)
Monocytes Relative: 4 % (ref 3–12)
Neutrophils Relative %: 84 % — ABNORMAL HIGH (ref 43–77)
Platelets: 341 10*3/uL (ref 150–400)
RBC: 3.7 MIL/uL — ABNORMAL LOW (ref 3.87–5.11)
WBC: 14 10*3/uL — ABNORMAL HIGH (ref 4.0–10.5)

## 2013-02-09 LAB — TROPONIN I: Troponin I: <0.3 ng/mL (ref <0.30)

## 2013-02-09 MED ORDER — SODIUM CHLORIDE 0.9 % IV BOLUS (SEPSIS)
1000.0000 mL | Freq: Once | INTRAVENOUS | Status: AC
  Administered 2013-02-09: 1000 mL via INTRAVENOUS

## 2013-02-09 MED ORDER — ONDANSETRON HCL 4 MG/2ML IJ SOLN
4.0000 mg | Freq: Once | INTRAMUSCULAR | Status: AC
  Administered 2013-02-09: 4 mg via INTRAVENOUS
  Filled 2013-02-09: qty 2

## 2013-02-09 MED ORDER — TRANEXAMIC ACID 100 MG/ML IV SOLN
1000.0000 mg | INTRAVENOUS | Status: AC
  Administered 2013-02-09: 1000 mg via INTRAVENOUS
  Filled 2013-02-09: qty 10

## 2013-02-09 MED ORDER — TRANEXAMIC ACID 100 MG/ML IV SOLN
500.0000 mg | INTRAVENOUS | Status: DC
  Filled 2013-02-09: qty 5

## 2013-02-09 NOTE — ED Provider Notes (Addendum)
CSN: [id number]     Arrival date & time 02/09/13  0820 History   First MD Initiated Contact with Patient 02/09/13 0830     Chief Complaint  Patient presents with  . Epistaxis   Patient is a 79 y.o. female presenting with nosebleeds. The history is provided by the patient, the spouse and a relative. Language interpreter used: active bleeding.  Epistaxis Location:  L nare Severity:  Moderate Duration:  2 hours Timing:  Constant Progression:  Waxing and waning Chronicity:  Recurrent (over the past week) Context: aspirin use   Context: not anticoagulants   Relieved by:  Nothing Ineffective treatments:  Applying pressure and ice Associated symptoms: blood in oropharynx   Associated symptoms: no cough, no dizziness and no headaches   Risk factors: no alcohol use, no change in medication and no frequent nosebleeds   Risk factors comment:  Patient had ablation of bleeding 10 years ago in opposite side   Past Medical History  Diagnosis Date  . Anxiety disorder   . Arthritis   . Asthma   . Hx of adenomatous colonic polyps   . Fibromyalgia   . Hyperlipidemia   . Hypertension   . IBS (irritable bowel syndrome)   . Peptic ulcer disease   . Fatty liver   . Abnormal LFTs (liver function tests)   . Diverticulosis   . Depression   . Abdominal bloating    Past Surgical History  Procedure Laterality Date  . Abdominal hysterectomy    . Thyroidectomy    . Nasal septum surgery    . Eye surgery Right 2006  . Back surgery  2005    lower back  . Right shoulder skin cancer excision  2012  . Right leg skin cancer surgery  2014  . Right knee surgery  2006    arthroscopy   Family History  Problem Relation Age of Onset  . Colon polyps      aunt  . Diabetes Father   . Heart disease Mother   . Heart disease Father   . Colon cancer Neg Hx   . Kidney disease      niece  . Lung cancer    . Arthritis     History  Substance Use Topics  . Smoking status: Never Smoker   . Smokeless  tobacco: Never Used  . Alcohol Use: No   OB History   Grav Para Term Preterm Abortions TAB SAB Ect Mult Living                 Review of Systems  HENT: Positive for nosebleeds.   Respiratory: Negative for cough.   Neurological: Negative for dizziness and headaches.    Allergies  Sulfonamide derivatives  Home Medications   Current Outpatient Rx  Name  Route  Sig  Dispense  Refill  . albuterol (PROVENTIL,VENTOLIN) 90 MCG/ACT inhaler   Inhalation   Inhale 2 puffs into the lungs every 4 (four) hours as needed for wheezing or shortness of breath.          Marland Kitchen amLODipine-olmesartan (AZOR) 10-40 MG per tablet   Oral   Take 1 tablet by mouth at bedtime.         Marland Kitchen antiseptic oral rinse (BIOTENE) LIQD   Mouth Rinse   15 mLs by Mouth Rinse route as needed.         Marland Kitchen aspirin 81 MG tablet   Oral   Take 81 mg by mouth daily.           Marland Kitchen  azelastine (ASTELIN) 137 MCG/SPRAY nasal spray   Nasal   Place 2 sprays into the nose 2 (two) times daily. Use in each nostril as directed         . budesonide-formoterol (SYMBICORT) 80-4.5 MCG/ACT inhaler   Inhalation   Inhale 2 puffs into the lungs 2 (two) times daily.   3 Inhaler   3   . dextromethorphan (DELSYM) 30 MG/5ML liquid      2 tsp twice daily as needed for cough         . diazepam (VALIUM) 5 MG tablet      Take 1/2 tablet by mouth twice daily as needed for back spasms          . esomeprazole (NEXIUM) 40 MG capsule      Take 30-60 min before first meal of the day         . famotidine (PEPCID) 20 MG tablet   Oral   Take 1 tablet (20 mg total) by mouth at bedtime.   30 tablet   5   . FLUoxetine (PROZAC) 20 MG capsule   Oral   Take 20 mg by mouth daily.         Marland Kitchen oxycodone (OXY-IR) 5 MG capsule      Take 1/2-1 tablet by mouth three times daily as needed for severe back pain         . sodium chloride (OCEAN) 0.65 % nasal spray   Nasal   Place 1 spray into the nose as needed for congestion.            BP 150/67  Pulse 105  Temp(Src) 98.2 F (36.8 C) (Oral)  Resp 24  SpO2 92% Physical Exam  Nursing note and vitals reviewed. Constitutional: She is oriented to person, place, and time. She appears well-developed and well-nourished. No distress.  HENT:  Head: Normocephalic and atraumatic.  Nose: Epistaxis is observed.    Eyes: Conjunctivae and EOM are normal.  Cardiovascular: Normal rate and regular rhythm.   Pulmonary/Chest: Effort normal and breath sounds normal. No stridor. No respiratory distress.  Abdominal: She exhibits no distension.  Musculoskeletal: She exhibits no edema.  Neurological: She is alert and oriented to person, place, and time. No cranial nerve deficit.  Skin: Skin is warm and dry.  Psychiatric: She has a normal mood and affect.    ED Course  EPISTAXIS MANAGEMENT Date/Time: 02/09/2013 10:30 AM Performed by: Gerhard Munch Authorized by: Gerhard Munch Consent: Verbal consent obtained. Risks and benefits: risks, benefits and alternatives were discussed Consent given by: patient Patient understanding: patient states understanding of the procedure being performed Patient consent: the patient's understanding of the procedure matches consent given Procedure consent: procedure consent matches procedure scheduled Relevant documents: relevant documents present and verified Test results: test results available and properly labeled Site marked: the operative site was marked Imaging studies: imaging studies available Required items: required blood products, implants, devices, and special equipment available Patient identity confirmed: verbally with patient Time out: Immediately prior to procedure a "time out" was called to verify the correct patient, procedure, equipment, support staff and site/side marked as required. Preparation: Patient was prepped and draped in the usual sterile fashion. Anesthesia: local infiltration Local anesthetic: tranexamic  acid. Anesthetic total: 5 ml Patient sedated: no Treatment site: left. Repair method: tranexamic acid. Post-procedure assessment: bleeding stopped Treatment complexity: simple Recurrence: recurrence of recent bleed Patient tolerance: Patient tolerated the procedure well with no immediate complications.   (including critical care time) Labs Review  Labs Reviewed - No data to display Imaging Review No results found.  EKG Interpretation   None        Update: Patient's bleeding has diminished. However, the patient had one episode of melena  10:27 AM Patient now states that she feels near syncopal.  She also complains of nausea.  Following removal of the TXA saturated gauze, there is no active bleeding.   12:50 PM Patient states that she feels substantially better.  She has been ambulatory with no decompensation, no orthostatic changes. No additional epistaxis. I had a lengthy conversation with her, her daughter, her husband about epistaxis control, the need for follow up with ENT and primary care.  With the patient's decreased hemoglobin I did propose admission for evaluation and monitoring.  With no additional episodes of melena,, no current complaints, the patient deferred this recommendation.  This seems reasonable. MDM   1. Epistaxis   2. Anemia    This patient presents after one week of intermittent epistaxis, with ongoing bleeding on arrival.  Patient's epistaxis stops with treatment of TXA bus during the evaluation the patient has an episode of near syncope, melena.  With the patient's description of the bleeding for one week, there is likelihood of this as a source for her blood.  Patient has no abdominal pain, no history of GI bleed, and after a single episode of near-syncope she recovered entirely.  Had lengthy conversation about return precautions, follow up instructions, and the patient was discharged, per her request, to followup as scheduled with ENT and her primary  care physician.    Gerhard Munch, MD 02/09/13 1252  Gerhard Munch, MD 02/09/13 1256

## 2013-02-09 NOTE — ED Notes (Addendum)
Pt c/o of nose bleed.  Pt has a hx of nose bleeds and has had a right side cauterization around 10 years ago.  It is the left side that is bleeding during this visit started around 2 am.  Pt in NAD, A&O.

## 2013-02-09 NOTE — ED Notes (Signed)
Pt holding pressure to nose, ice pack also applied to nose, bleeding controlled at the time.

## 2013-03-30 ENCOUNTER — Other Ambulatory Visit: Payer: Self-pay | Admitting: Internal Medicine

## 2013-03-30 MED ORDER — FAMOTIDINE 20 MG PO TABS: 20.0000 mg | ORAL_TABLET | Freq: Every day | ORAL | Status: DC

## 2013-04-01 DIAGNOSIS — G2581 Restless legs syndrome: Secondary | ICD-10-CM | POA: Diagnosis not present

## 2013-04-01 DIAGNOSIS — R5383 Other fatigue: Secondary | ICD-10-CM | POA: Diagnosis not present

## 2013-04-01 DIAGNOSIS — B370 Candidal stomatitis: Secondary | ICD-10-CM | POA: Diagnosis not present

## 2013-04-01 DIAGNOSIS — M48061 Spinal stenosis, lumbar region without neurogenic claudication: Secondary | ICD-10-CM | POA: Diagnosis not present

## 2013-04-01 DIAGNOSIS — M545 Low back pain, unspecified: Secondary | ICD-10-CM | POA: Diagnosis not present

## 2013-04-01 DIAGNOSIS — F3289 Other specified depressive episodes: Secondary | ICD-10-CM | POA: Diagnosis not present

## 2013-04-01 DIAGNOSIS — R040 Epistaxis: Secondary | ICD-10-CM | POA: Diagnosis not present

## 2013-04-01 DIAGNOSIS — F329 Major depressive disorder, single episode, unspecified: Secondary | ICD-10-CM | POA: Diagnosis not present

## 2013-04-01 DIAGNOSIS — R5381 Other malaise: Secondary | ICD-10-CM | POA: Diagnosis not present

## 2013-04-01 DIAGNOSIS — G609 Hereditary and idiopathic neuropathy, unspecified: Secondary | ICD-10-CM | POA: Diagnosis not present

## 2013-04-13 DIAGNOSIS — B370 Candidal stomatitis: Secondary | ICD-10-CM | POA: Diagnosis not present

## 2013-04-13 DIAGNOSIS — M6281 Muscle weakness (generalized): Secondary | ICD-10-CM | POA: Diagnosis not present

## 2013-04-13 DIAGNOSIS — IMO0001 Reserved for inherently not codable concepts without codable children: Secondary | ICD-10-CM | POA: Diagnosis not present

## 2013-04-20 DIAGNOSIS — M353 Polymyalgia rheumatica: Secondary | ICD-10-CM | POA: Diagnosis not present

## 2013-04-20 DIAGNOSIS — IMO0001 Reserved for inherently not codable concepts without codable children: Secondary | ICD-10-CM | POA: Diagnosis not present

## 2013-04-20 DIAGNOSIS — R5383 Other fatigue: Secondary | ICD-10-CM | POA: Diagnosis not present

## 2013-04-20 DIAGNOSIS — R700 Elevated erythrocyte sedimentation rate: Secondary | ICD-10-CM | POA: Diagnosis not present

## 2013-04-20 DIAGNOSIS — R5381 Other malaise: Secondary | ICD-10-CM | POA: Diagnosis not present

## 2013-04-27 DIAGNOSIS — D509 Iron deficiency anemia, unspecified: Secondary | ICD-10-CM | POA: Diagnosis not present

## 2013-04-27 DIAGNOSIS — IMO0001 Reserved for inherently not codable concepts without codable children: Secondary | ICD-10-CM | POA: Diagnosis not present

## 2013-04-27 DIAGNOSIS — J309 Allergic rhinitis, unspecified: Secondary | ICD-10-CM | POA: Diagnosis not present

## 2013-04-27 DIAGNOSIS — M353 Polymyalgia rheumatica: Secondary | ICD-10-CM | POA: Diagnosis not present

## 2013-05-12 DIAGNOSIS — H903 Sensorineural hearing loss, bilateral: Secondary | ICD-10-CM | POA: Diagnosis not present

## 2013-05-25 DIAGNOSIS — M353 Polymyalgia rheumatica: Secondary | ICD-10-CM | POA: Diagnosis not present

## 2013-05-27 ENCOUNTER — Telehealth: Payer: Self-pay | Admitting: *Deleted

## 2013-05-27 NOTE — Telephone Encounter (Signed)
Message copied by Hulan Saas on Thu May 27, 2013  9:32 AM ------      Message from: Hulan Saas      Created: Tue Dec 01, 2012  8:29 AM       Call and remind patient LFT due for DB 05/31/13. (Lab in [facility]) ------

## 2013-05-27 NOTE — Telephone Encounter (Signed)
Left a message for patient's daughter to call me.

## 2013-05-28 NOTE — Telephone Encounter (Signed)
Spoke with patient's daughter [name] and she will bring patient for labs.

## 2013-06-03 ENCOUNTER — Other Ambulatory Visit: Payer: Self-pay | Admitting: *Deleted

## 2013-06-03 ENCOUNTER — Other Ambulatory Visit (INDEPENDENT_AMBULATORY_CARE_PROVIDER_SITE_OTHER): Payer: Medicare Other

## 2013-06-03 DIAGNOSIS — R945 Abnormal results of liver function studies: Secondary | ICD-10-CM

## 2013-06-03 DIAGNOSIS — R7989 Other specified abnormal findings of blood chemistry: Secondary | ICD-10-CM

## 2013-06-03 LAB — HEPATIC FUNCTION PANEL
ALT: 38 U/L — AB (ref 0–35)
AST: 42 U/L — ABNORMAL HIGH (ref 0–37)
Albumin: 3.7 g/dL (ref 3.5–5.2)
Alkaline Phosphatase: 79 U/L (ref 39–117)
Bilirubin, Direct: 0 mg/dL (ref 0.0–0.3)
TOTAL PROTEIN: 6.7 g/dL (ref 6.0–8.3)
Total Bilirubin: 0.4 mg/dL (ref 0.3–1.2)

## 2013-06-09 ENCOUNTER — Other Ambulatory Visit: Payer: Self-pay | Admitting: Internal Medicine

## 2013-06-22 ENCOUNTER — Ambulatory Visit (INDEPENDENT_AMBULATORY_CARE_PROVIDER_SITE_OTHER): Payer: Medicare Other | Admitting: Internal Medicine

## 2013-06-22 ENCOUNTER — Encounter: Payer: Self-pay | Admitting: Internal Medicine

## 2013-06-22 VITALS — BP 138/78 | HR 66 | Ht 63.5 in | Wt 201.4 lb

## 2013-06-22 DIAGNOSIS — Z86010 Personal history of colonic polyps: Secondary | ICD-10-CM | POA: Diagnosis not present

## 2013-06-22 NOTE — Patient Instructions (Signed)
You have been scheduled for a colonoscopy with propofol. Please follow written instructions given to you at your visit today.  Please pick up your prep kit at the pharmacy within the next 1-3 days. If you use inhalers (even only as needed), please bring them with you on the day of your procedure. Your physician has requested that you go to www.startemmi.com and enter the access code given to you at your visit today. This web site gives a general overview about your procedure. However, you should still follow specific instructions given to you by our office regarding your preparation for the procedure.  CC: Dr Modena Morrow

## 2013-06-22 NOTE — Progress Notes (Signed)
[name] 09/19/1929 [id number]  Note: This dictation was prepared with Dragon digital system. Any transcriptional errors that result from this procedure are unintentional.   History of Present Illness:  This is an 80 year old white female who is here to discuss having a colonoscopy. She had a colonoscopy in 2009 with findings of a tubular adenoma at 25 cm. Prior colonoscopies were in 1998 (polyp removed) and in 2005 (tubular adenoma removed). She has been followed for steatohepatitis demonstrated on a liver biopsy in 2012. Her transaminases have been mildly elevated. There has been no evidence of portal hypertension and her liver synthetic function has been normal. Her last liver function test in March 2015 showed an AST of 42 and ALT of 38 with a normal albumin of 3.7. A limited upper abdominal ultrasound of the right upper quadrant in April 2014 showed hepatomegaly measuring approximately 18.5 cm. There was no hepatic nodule or biliary dilatation. A CT scan of the abdomen in August 2011 confirmed fatty liver. She denies constipation, diarrhea or rectal bleeding. She has been diagnosed with PMR and has been on prednisone taper for an elevated sedimentation rate. She has gained about 10 pounds. She is due for a recall colonoscopy.    Past Medical History  Diagnosis Date  . Anxiety disorder   . Arthritis   . Asthma   . Hx of adenomatous colonic polyps   . Fibromyalgia   . Hyperlipidemia   . Hypertension   . IBS (irritable bowel syndrome)   . Peptic ulcer disease   . Fatty liver   . Abnormal LFTs (liver function tests)   . Diverticulosis   . Depression   . Abdominal bloating     Past Surgical History  Procedure Laterality Date  . Abdominal hysterectomy    . Thyroidectomy    . Nasal septum surgery    . Eye surgery Right 2006  . Back surgery  2005    lower back  . Right shoulder skin cancer excision  2012  . Right leg skin cancer surgery  2014  . Right knee surgery  2006   arthroscopy    Allergies  Allergen Reactions  . Sulfonamide Derivatives Rash    Family history and social history have been reviewed.  Review of Systems:   The remainder of the 10 point ROS is negative except as outlined in the H&P  Physical Exam: General Appearance Well developed, in no distress, overweight Eyes  Non icteric  HEENT  Non traumatic, normocephalic  Mouth No lesion, tongue papillated, no cheilosis Neck Supple without adenopathy, thyroid not enlarged, no carotid bruits, no JVD Lungs Clear to auscultation bilaterally COR Normal S1, normal S2, regular rhythm, no murmur, quiet precordium Abdomen large protuberant abdomen with mild tenderness in left lower quadrant and diffuse tenderness throughout all quadrants. Normal active bowel sounds. No ascites. Unable to feel the liver edge to large size Rectal soft Hemoccult negative stool Extremities  trace pedal edema Skin No lesions Neurological Alert and oriented x 3 Psychological Normal mood and affect  Assessment and Plan:   Problem #1 History of tubular adenoma of the colon removed 6 years ago. She is due for a recall colonoscopy. She is in good enough health to undergo a colonoscopy. She agrees with the prep and with scheduling the procedure. She is Hemoccult-negative.  Problem #2 Polymyalgia rheumatica, recently diagnosed. Patient has been on a prednisone taper.  Problem #3 Steatohepatitis on liver biopsy in 2012. There is no evidence of cirrhosis or portal hypertension. She  is trying to lose weight.    Delfin Edis 06/22/2013

## 2013-07-01 DIAGNOSIS — IMO0001 Reserved for inherently not codable concepts without codable children: Secondary | ICD-10-CM | POA: Diagnosis not present

## 2013-07-01 DIAGNOSIS — R29898 Other symptoms and signs involving the musculoskeletal system: Secondary | ICD-10-CM | POA: Diagnosis not present

## 2013-07-01 DIAGNOSIS — M353 Polymyalgia rheumatica: Secondary | ICD-10-CM | POA: Diagnosis not present

## 2013-07-02 ENCOUNTER — Encounter: Payer: Self-pay | Admitting: Internal Medicine

## 2013-07-02 ENCOUNTER — Ambulatory Visit (AMBULATORY_SURGERY_CENTER): Payer: Medicare Other | Admitting: Internal Medicine

## 2013-07-02 VITALS — BP 141/70 | HR 80 | Temp 98.5°F | Resp 23 | Ht 63.0 in | Wt 201.0 lb

## 2013-07-02 DIAGNOSIS — Z86010 Personal history of colonic polyps: Secondary | ICD-10-CM | POA: Diagnosis not present

## 2013-07-02 DIAGNOSIS — Z1211 Encounter for screening for malignant neoplasm of colon: Secondary | ICD-10-CM | POA: Diagnosis not present

## 2013-07-02 DIAGNOSIS — D126 Benign neoplasm of colon, unspecified: Secondary | ICD-10-CM

## 2013-07-02 DIAGNOSIS — I10 Essential (primary) hypertension: Secondary | ICD-10-CM | POA: Diagnosis not present

## 2013-07-02 DIAGNOSIS — J45909 Unspecified asthma, uncomplicated: Secondary | ICD-10-CM | POA: Diagnosis not present

## 2013-07-02 MED ORDER — SODIUM CHLORIDE 0.9 % IV SOLN: 500.0000 mL | INTRAVENOUS | Status: DC

## 2013-07-02 NOTE — Patient Instructions (Signed)
Discharge instructions given with verbal understanding. Handouts on polyps and diverticulosis. Resume previous medications. YOU HAD AN ENDOSCOPIC PROCEDURE TODAY AT THE  ENDOSCOPY CENTER: Refer to the procedure report that was given to you for any specific questions about what was found during the examination.  If the procedure report does not answer your questions, please call your gastroenterologist to clarify.  If you requested that your care partner not be given the details of your procedure findings, then the procedure report has been included in a sealed envelope for you to review at your convenience later.  YOU SHOULD EXPECT: Some feelings of bloating in the abdomen. Passage of more gas than usual.  Walking can help get rid of the air that was put into your GI tract during the procedure and reduce the bloating. If you had a lower endoscopy (such as a colonoscopy or flexible sigmoidoscopy) you may notice spotting of blood in your stool or on the toilet paper. If you underwent a bowel prep for your procedure, then you may not have a normal bowel movement for a few days.  DIET: Your first meal following the procedure should be a light meal and then it is ok to progress to your normal diet.  A half-sandwich or bowl of soup is an example of a good first meal.  Heavy or fried foods are harder to digest and may make you feel nauseous or bloated.  Likewise meals heavy in dairy and vegetables can cause extra gas to form and this can also increase the bloating.  Drink plenty of fluids but you should avoid alcoholic beverages for 24 hours.  ACTIVITY: Your care partner should take you home directly after the procedure.  You should plan to take it easy, moving slowly for the rest of the day.  You can resume normal activity the day after the procedure however you should NOT DRIVE or use heavy machinery for 24 hours (because of the sedation medicines used during the test).    SYMPTOMS TO REPORT  IMMEDIATELY: A gastroenterologist can be reached at any hour.  During normal business hours, 8:30 AM to 5:00 PM Monday through Friday, call [phone].  After hours and on weekends, please call the GI answering service at [phone] who will take a message and have the physician on call contact you.   Following lower endoscopy (colonoscopy or flexible sigmoidoscopy):  Excessive amounts of blood in the stool  Significant tenderness or worsening of abdominal pains  Swelling of the abdomen that is new, acute  Fever of 100F or higher  FOLLOW UP: If any biopsies were taken you will be contacted by phone or by letter within the next 1-3 weeks.  Call your gastroenterologist if you have not heard about the biopsies in 3 weeks.  Our staff will call the home number listed on your records the next business day following your procedure to check on you and address any questions or concerns that you may have at that time regarding the information given to you following your procedure. This is a courtesy call and so if there is no answer at the home number and we have not heard from you through the emergency physician on call, we will assume that you have returned to your regular daily activities without incident.  SIGNATURES/CONFIDENTIALITY: You and/or your care partner have signed paperwork which will be entered into your electronic medical record.  These signatures attest to the fact that that the information above on your After Visit Summary   has been reviewed and is understood.  Full responsibility of the confidentiality of this discharge information lies with you and/or your care-partner. 

## 2013-07-02 NOTE — Op Note (Signed)
[facility]  [address] [place], [address]   COLONOSCOPY PROCEDURE REPORT  PATIENT: [name], [name]  MR#: [id number] BIRTHDATE: [birth year] , 84  yrs. old GENDER: Female ENDOSCOPIST: Lafayette Dragon, MD REFERRED KM:MNOTR Spear, M.D. PROCEDURE DATE:  07/02/2013 PROCEDURE:   Colonoscopy with cold biopsy polypectomy and Colonoscopy with snare polypectomy First Screening Colonoscopy - Avg.  risk and is 50 yrs.  old or older - No.  Prior Negative Screening - Now for repeat screening. N/A  History of Adenoma - Now for follow-up colonoscopy & has been > or = to 3 yrs.  Yes hx of adenoma.  Has been 3 or more years since last colonoscopy.  Polyps Removed Today? Yes. ASA CLASS:   Class II INDICATIONS:adenomatous polyps in 1998, 2003 and 2009. MEDICATIONS: MAC sedation, administered by CRNA and propofol (Diprivan) 250mg  IV  DESCRIPTION OF PROCEDURE:   After the risks benefits and alternatives of the procedure were thoroughly explained, informed consent was obtained.  A digital rectal exam revealed no abnormalities of the rectum.   The LB [id number]-H190 [id number]  endoscope was introduced through the anus and advanced to the cecum, which was identified by both the appendix and ileocecal valve. No adverse events experienced.   The quality of the prep was good, using MoviPrep  The instrument was then slowly withdrawn as the colon was fully examined.      COLON FINDINGS: Four polypoid shaped sessile polyps ranging between 3-25mm in size were found in the ascending colon.  A polypectomy was performed with cold forceps and with a cold snare.  The resection was complete and the polyp tissue was completely retrieved.   Mild diverticulosis was noted throughout the entire examined colon. Retroflexed views revealed no abnormalities. The time to cecum=3 minutes 21 seconds.  Withdrawal time=15 minutes 52 seconds.  The scope was withdrawn and the procedure completed. COMPLICATIONS: There  were no complications.  ENDOSCOPIC IMPRESSION: 1.   Four sessile polyps ranging between 3-37mm in size were found in the ascending colon; polypectomy was performed with cold forceps and with a cold snare 2.   Mild diverticulosis was noted throughout the entire examined colon  RECOMMENDATIONS: 1.  Await pathology results 2.  high fiber diet No recall colonoscopy due to age   eSigned:  Lafayette Dragon, MD 07/02/2013 9:51 AM   cc:   PATIENT NAME:  [name], [name] MR#: [id number]

## 2013-07-02 NOTE — Progress Notes (Signed)
Called to room to assist during endoscopic procedure.  Patient ID and intended procedure confirmed with present staff. Received instructions for my participation in the procedure from the performing physician.  

## 2013-07-05 ENCOUNTER — Telehealth: Payer: Self-pay

## 2013-07-05 NOTE — Telephone Encounter (Signed)
  Follow up Call-  Call back number 07/02/2013  Post procedure Call Back phone  # [phone], [name]  Permission to leave phone message Yes     Patient questions:  Do you have a fever, pain , or abdominal swelling? no Pain Score  0 *  Have you tolerated food without any problems? yes  Have you been able to return to your normal activities? yes  Do you have any questions about your discharge instructions: Diet   no Medications  no Follow up visit  no  Do you have questions or concerns about your Care? no  Actions: * If pain score is 4 or above: No action needed, pain <4.

## 2013-07-07 ENCOUNTER — Encounter: Payer: Self-pay | Admitting: Internal Medicine

## 2013-07-12 ENCOUNTER — Telehealth: Payer: Self-pay | Admitting: Internal Medicine

## 2013-07-12 NOTE — Telephone Encounter (Signed)
Called spoke with patient's daughter, [name].  Per [name] pt's BP was 143/62, HR 84, O2 96% at rest.  With moderate exertion (walking from the living room to the kitchen) pt's sats dropped to 88%, symptomatic.  Some wheezing and occasional dry cough.  Pitting edema in both ankles.  Symptoms began over the weekend 4/25-4/26.  [name] denies any hemoptysis, nausea, vomiting, f/c/s.  Pt is currently on Prednisone 30mg  x10days from PCP for Polymyalgia Rheumatica.    Appt scheduled with MW tomorrow 4/28 @ 4pm.  [name] is aware to call back for sooner follow up or seek emergency care if pt's symptoms worsen prior to ov. Last ov 10.27.14

## 2013-07-13 ENCOUNTER — Ambulatory Visit (INDEPENDENT_AMBULATORY_CARE_PROVIDER_SITE_OTHER): Payer: Medicare Other | Admitting: Internal Medicine

## 2013-07-13 ENCOUNTER — Ambulatory Visit (INDEPENDENT_AMBULATORY_CARE_PROVIDER_SITE_OTHER)
Admission: RE | Admit: 2013-07-13 | Discharge: 2013-07-13 | Disposition: A | Discharge status: Home / Self Care | Payer: Medicare Other | Source: Ambulatory Visit | Attending: Internal Medicine | Admitting: Internal Medicine

## 2013-07-13 ENCOUNTER — Encounter: Payer: Self-pay | Admitting: Internal Medicine

## 2013-07-13 ENCOUNTER — Other Ambulatory Visit (INDEPENDENT_AMBULATORY_CARE_PROVIDER_SITE_OTHER): Payer: Medicare Other

## 2013-07-13 VITALS — BP 142/60 | HR 80 | Temp 98.4°F | Ht 63.5 in | Wt 205.0 lb

## 2013-07-13 DIAGNOSIS — D649 Anemia, unspecified: Secondary | ICD-10-CM

## 2013-07-13 DIAGNOSIS — R0989 Other specified symptoms and signs involving the circulatory and respiratory systems: Secondary | ICD-10-CM | POA: Diagnosis not present

## 2013-07-13 DIAGNOSIS — D509 Iron deficiency anemia, unspecified: Secondary | ICD-10-CM

## 2013-07-13 DIAGNOSIS — R0609 Other forms of dyspnea: Secondary | ICD-10-CM

## 2013-07-13 DIAGNOSIS — J45909 Unspecified asthma, uncomplicated: Secondary | ICD-10-CM | POA: Diagnosis not present

## 2013-07-13 DIAGNOSIS — R0600 Dyspnea, unspecified: Secondary | ICD-10-CM

## 2013-07-13 DIAGNOSIS — J40 Bronchitis, not specified as acute or chronic: Secondary | ICD-10-CM | POA: Diagnosis not present

## 2013-07-13 LAB — CBC WITH DIFFERENTIAL/PLATELET
BASOS ABS: 0 10*3/uL (ref 0.0–0.1)
Basophils Relative: 0 % (ref 0.0–3.0)
Eosinophils Absolute: 0 10*3/uL (ref 0.0–0.7)
Eosinophils Relative: 0.2 % (ref 0.0–5.0)
HCT: 27.1 % — ABNORMAL LOW (ref 36.0–46.0)
Hemoglobin: 8.3 g/dL — ABNORMAL LOW (ref 12.0–15.0)
LYMPHS ABS: 1.2 10*3/uL (ref 0.7–4.0)
Lymphocytes Relative: 6.3 % — ABNORMAL LOW (ref 12.0–46.0)
MCHC: 30.7 g/dL (ref 30.0–36.0)
MCV: 68.6 fl — ABNORMAL LOW (ref 78.0–100.0)
MONOS PCT: 1.4 % — AB (ref 3.0–12.0)
Monocytes Absolute: 0.3 10*3/uL (ref 0.1–1.0)
NEUTROS PCT: 92.1 % — AB (ref 43.0–77.0)
Neutro Abs: 17.4 10*3/uL — ABNORMAL HIGH (ref 1.4–7.7)
RBC: 3.95 Mil/uL (ref 3.87–5.11)
RDW: 19.1 % — AB (ref 11.5–14.6)
WBC: 18.9 10*3/uL (ref 4.5–10.5)

## 2013-07-13 LAB — BASIC METABOLIC PANEL
BUN: 19 mg/dL (ref 6–23)
CALCIUM: 9.5 mg/dL (ref 8.4–10.5)
CHLORIDE: 103 meq/L (ref 96–112)
CO2: 26 mEq/L (ref 19–32)
CREATININE: 1.1 mg/dL (ref 0.4–1.2)
GFR: 53.32 mL/min — AB (ref >60.00)
Glucose, Bld: 223 mg/dL — ABNORMAL HIGH (ref 70–99)
Potassium: 5 mEq/L (ref 3.5–5.1)
Sodium: 139 mEq/L (ref 135–145)

## 2013-07-13 LAB — BRAIN NATRIURETIC PEPTIDE: PRO B NATRI PEPTIDE: 119 pg/mL — AB (ref 0.0–100.0)

## 2013-07-13 LAB — TSH: TSH: 1.02 u[IU]/mL (ref 0.35–5.50)

## 2013-07-13 NOTE — Progress Notes (Signed)
Subjective:    Patient ID: [name], female    DOB: 08/29/1929   MRN: [id number]    Brief patient profile:  56 yowf never smoker no resp or allergy problems until 2011 onset referred by Dr Modena Morrow  Pulmonary clinic for cough and sob with dx of asthma by pft's 11/30/12   History of Present Illness  10/14/2012 1st pulmonary eval cc variable doe x 3 years  assoc mostly dry cough now daily x across the house much better when on prednisone last took July 14th off advair due to mouth irritation but not clear it really helped the breathing that much, assoc with overt HB and chest discomfort at rest and hs but not with ex.albuterol helps some  >PPI /Pepcid  D/c fish oil   10/29/2012 follow up and med review  Patient returns for followup and medication review.   reports DOE is 40% improved since last ov.  no new complaints. We reviewed all her medications and organized them into a medication calendar with patient education. Patient appears to be taking her medications correctly. Patient continues to complain of postnasal drip and drainage. Dry cough and dyspnea. Are slightly improved. rec Add Chlortrimeton 4mg  2  At bedtime   Take Allegra 180mg  daily in am .  Increase Astelin 2 puffs Twice daily   May use Mucinex As needed  For congestion/thick mucus May use Delsym 2 tsp Twice daily  For cough As needed   GOAL IS TO NOT COUGH OR CLEAR THROAT.  NO MINTS . GERD DIET Follow med calendar closely and bring to each visit  11/30/2012 f/u ov/Efton Thomley re cough and sob Chief Complaint  Patient presents with  . Follow-up    pt reports breathing is much better-- denies any new concerns at this time   using lots of saba to control sob and cough mostly dry rec symbicort 80 2 puffs first thing in am and then another 2 puffs about 12 hours later.    01/11/2013 f/u ov/Jewelz Kobus re: chronic asthma Chief Complaint  Patient presents with  . Follow-up    Breathing improved since last OV.  much better on  symbicort 80 2bid with no limiting sob, cough, no need for saba  rec Either use dulera 100 or symbicort 80 Take 2 puffs first thing in am and then another 2 puffs about 12 hours later.  Only use your albuterol prn   07/02/13 polypectomy s abd pain or obvious bleeding     07/13/2013 acute extended  ov/Edmundo Tedesco re: recurrent sob x 4 days Chief Complaint  Patient presents with  . Acute Visit    Pt c/o DOE for the past 4 days. She states that she can not walk across the room without getting SOB. She states that she feels very fatigued and has no energy. She states that she has been using her rescue inhaler 2 to 3 times per day.    Was not needing any saba all. Ok at hs > feels fine when lies down"that's all I want to do" Only problem is with walking - ok sitting Sense of Drainage not new >no purulent sputum  hfa ok, does not relieve sense of can't get a deep breath/ congestion.   No obvious day to day or daytime variabilty or assoc cp   subjective wheeze overt sinus or hb symptoms. No unusual exp hx or h/o childhood pna/ asthma or knowledge of premature birth.  Sleeping ok without nocturnal  or early am exacerbation  of respiratory  c/o's or need for noct saba. Also denies any obvious fluctuation of symptoms with weather or environmental changes or other aggravating or alleviating factors except as outlined above   Current Medications, Allergies, Complete Past Medical History, Past Surgical History, Family History, and Social History were reviewed in [employer] record.  ROS  The following are not active complaints unless bolded sore throat, dysphagia, dental problems, itching, sneezing,  nasal congestion or excess/ purulent secretions, ear ache,   fever, chills, sweats, unintended wt loss, pleuritic or exertional cp, hemoptysis,  orthopnea pnd or leg swelling, presyncope, palpitations, heartburn, abdominal pain, anorexia, nausea, vomiting, diarrhea  or change in bowel or  urinary habits, change in stools or urine, dysuria,hematuria,  rash, arthralgias, visual complaints, headache, numbness weakness or ataxia or problems with walking or coordination,  change in mood/affect or memory.           Objective:   Physical Exam    Obese severely  hoarse amb wf nad but quite pale   07/13/2013        205 Wt Readings from Last 3 Encounters:  01/11/13 199 lb (90.266 kg)  11/30/12 197 lb 9.6 oz (89.631 kg)  10/29/12 199 lb 3.2 oz (90.357 kg)      HEENT: nl dentition, turbinates, and orophanx. Nl external ear canals without cough reflex   NECK :  without JVD/Nodes/TM/ nl carotid upstrokes bilaterally   LUNGS: no acc muscle use, clear to A and P bilaterally without cough on insp or exp maneuvers   CV:  RRR  no s3 or murmur or increase in P2, slt swelling R leg   ABD:  soft and nontender with nl excursion in the supine position. No bruits or organomegaly, bowel sounds nl  MS:  warm without deformities, calf tenderness, cyanosis or clubbing  SKIN: warm and dry without lesions     CXR  07/13/2013 :  Enlargement of cardiac silhouette. Mediastinal contours and pulmonary vascularity normal. Atherosclerotic calcification aorta. Chronic peribronchial thickening and mild hyperinflation. No acute infiltrate, pleural effusion or pneumothorax    Recent Labs Lab 07/13/13 1643  NA 139  K 5.0  CL 103  CO2 26  BUN 19  CREATININE 1.1  GLUCOSE 223*    Recent Labs Lab 07/13/13 1643  HGB 8.3*  HCT 27.1*  WBC 18.9 Repeated and verified X2.*  PLT 470.0 Repeated and verified X2.*      Lab Results  Component Value Date   PROBNP 119.0* 07/13/2013     07/13/13 ekg wandering baseline but no ischemic changes     Assessment & Plan:

## 2013-07-13 NOTE — Patient Instructions (Addendum)
Please remember to go to the lab first  and x-ray department downstairs for your tests - we will call you with the results when they are available.

## 2013-07-14 DIAGNOSIS — D509 Iron deficiency anemia, unspecified: Secondary | ICD-10-CM | POA: Insufficient documentation

## 2013-07-14 MED ORDER — FERROUS SULFATE 325 (65 FE) MG PO TABS: 325.0000 mg | ORAL_TABLET | Freq: Three times a day (TID) | ORAL | Status: DC

## 2013-07-14 NOTE — Progress Notes (Signed)
Quick Note:  Called and spoke to pt's daughter and informed her the Iron supplement would be called in to [facility], daughter verbalized understanding. Daughter stated she would be back on Friday for the repeat CBC, per Dr. Gustavus Bryant recs. ______

## 2013-07-14 NOTE — Progress Notes (Signed)
Quick Note:  LMTCB ______ 

## 2013-07-14 NOTE — Progress Notes (Signed)
Quick Note:  Called and spoke to pt's daughter regarding results and recs. Pt's daughter verbalized understanding and stated she would relay the message and denied any further questions or concerns at this time. ______

## 2013-07-14 NOTE — Assessment & Plan Note (Signed)
-   PFT's 11/27/12 FEV1  1.10 ( 59%) ratio 56 and 24 %  Better p B2, dlco 73 corrects to 95  - hfa 90% p coaching 01/11/2013   No evidence at all of asthma flare > no change rx

## 2013-07-14 NOTE — Assessment & Plan Note (Signed)
Probably had mild acute blood loss (asympt) from procedure and is so fe def can't mount a good bone marrow response but clearly having symptomatic anemia.  rec recheck w/in a week and in meantime rx fes04 and notify Dr Delfin Edis  Informed pt she should take it easy until recovers but in absence of active bleeding/ ischemia or hgb < 7 we don't normally transfuse or admit at this point

## 2013-07-14 NOTE — Assessment & Plan Note (Addendum)
07/13/2013   Walked RA x one lap @ 185 stopped due to  Sob no desat  Most likely related to anemia, see separate discussion  No evidence chf /PE/ILD/ pna

## 2013-07-16 ENCOUNTER — Telehealth: Payer: Self-pay | Admitting: *Deleted

## 2013-07-16 ENCOUNTER — Telehealth: Payer: Self-pay | Admitting: Internal Medicine

## 2013-07-16 ENCOUNTER — Other Ambulatory Visit (INDEPENDENT_AMBULATORY_CARE_PROVIDER_SITE_OTHER): Payer: Medicare Other

## 2013-07-16 DIAGNOSIS — R7989 Other specified abnormal findings of blood chemistry: Secondary | ICD-10-CM

## 2013-07-16 DIAGNOSIS — R945 Abnormal results of liver function studies: Secondary | ICD-10-CM

## 2013-07-16 DIAGNOSIS — D649 Anemia, unspecified: Secondary | ICD-10-CM

## 2013-07-16 LAB — HEPATIC FUNCTION PANEL
ALK PHOS: 71 U/L (ref 39–117)
ALT: 35 U/L (ref 0–35)
AST: 38 U/L — ABNORMAL HIGH (ref 0–37)
Albumin: 3.4 g/dL — ABNORMAL LOW (ref 3.5–5.2)
BILIRUBIN TOTAL: 0.5 mg/dL (ref 0.3–1.2)
Bilirubin, Direct: 0 mg/dL (ref 0.0–0.3)
TOTAL PROTEIN: 6.8 g/dL (ref 6.0–8.3)

## 2013-07-16 LAB — CBC WITH DIFFERENTIAL/PLATELET
BASOS PCT: 0.1 % (ref 0.0–3.0)
Basophils Absolute: 0 10*3/uL (ref 0.0–0.1)
EOS ABS: 0 10*3/uL (ref 0.0–0.7)
Eosinophils Relative: 0.2 % (ref 0.0–5.0)
HCT: 28.5 % — ABNORMAL LOW (ref 36.0–46.0)
Hemoglobin: 8.8 g/dL — ABNORMAL LOW (ref 12.0–15.0)
LYMPHS PCT: 7.7 % — AB (ref 12.0–46.0)
Lymphs Abs: 1.6 10*3/uL (ref 0.7–4.0)
MCHC: 30.9 g/dL (ref 30.0–36.0)
MCV: 69.5 fl — AB (ref 78.0–100.0)
Monocytes Absolute: 0.6 10*3/uL (ref 0.1–1.0)
Monocytes Relative: 2.9 % — ABNORMAL LOW (ref 3.0–12.0)
Neutro Abs: 18.1 10*3/uL — ABNORMAL HIGH (ref 1.4–7.7)
Neutrophils Relative %: 89.1 % — ABNORMAL HIGH (ref 43.0–77.0)
PLATELETS: 449 10*3/uL — AB (ref 150.0–400.0)
RBC: 4.11 Mil/uL (ref 3.87–5.11)
RDW: 18.7 % — ABNORMAL HIGH (ref 11.5–14.6)
WBC: 20.3 10*3/uL (ref 4.5–10.5)

## 2013-07-16 NOTE — Telephone Encounter (Signed)
Collected but not back yet, will check this evening and let her know

## 2013-07-16 NOTE — Telephone Encounter (Signed)
Message copied by Larina Bras on Fri Jul 16, 2013  5:24 PM ------      Message from: Lafayette Dragon      Created: Fri Jul 16, 2013  5:20 PM       Patient to have a repeat CBC in 1 week in our office.. Pt notified of today's results ------

## 2013-07-16 NOTE — Telephone Encounter (Signed)
Patient's daughter (caregiver) advised that Dr Olevia Perches has reviewed her CBC and hemoglobin/hematocrit has improved slightly. WBC high but this is expected with prednisone. Patient is to continue iron supplements and come back for repeat CBC in 1 week. Orders entered in EPIC. Daughter verbalizes understanding.

## 2013-07-16 NOTE — Progress Notes (Signed)
Quick Note:  Spoke with pt's daughter and notified of results per Dr. Wert. She verbalized understanding and denied any questions.  ______ 

## 2013-07-16 NOTE — Telephone Encounter (Signed)
Family wanted to make MW aware that the pt came by today for labs. These are pending in the computer.

## 2013-07-23 ENCOUNTER — Other Ambulatory Visit (INDEPENDENT_AMBULATORY_CARE_PROVIDER_SITE_OTHER): Payer: Medicare Other

## 2013-07-23 ENCOUNTER — Other Ambulatory Visit: Payer: Self-pay | Admitting: *Deleted

## 2013-07-23 DIAGNOSIS — D649 Anemia, unspecified: Secondary | ICD-10-CM

## 2013-07-23 LAB — CBC WITH DIFFERENTIAL/PLATELET
Basophils Absolute: 0.2 10*3/uL — ABNORMAL HIGH (ref 0.0–0.1)
Basophils Relative: 1.4 % (ref 0.0–3.0)
EOS ABS: 0.1 10*3/uL (ref 0.0–0.7)
Eosinophils Relative: 0.8 % (ref 0.0–5.0)
HCT: 31.1 % — ABNORMAL LOW (ref 36.0–46.0)
HEMOGLOBIN: 9.6 g/dL — AB (ref 12.0–15.0)
LYMPHS PCT: 20.2 % (ref 12.0–46.0)
Lymphs Abs: 3.3 10*3/uL (ref 0.7–4.0)
MCHC: 30.7 g/dL (ref 30.0–36.0)
MCV: 74.3 fl — ABNORMAL LOW (ref 78.0–100.0)
MONOS PCT: 5.1 % (ref 3.0–12.0)
Monocytes Absolute: 0.8 10*3/uL (ref 0.1–1.0)
NEUTROS ABS: 12 10*3/uL — AB (ref 1.4–7.7)
NEUTROS PCT: 72.5 % (ref 43.0–77.0)
Platelets: 347 10*3/uL (ref 150.0–400.0)
RBC: 4.18 Mil/uL (ref 3.87–5.11)
RDW: 26.3 % — AB (ref 11.5–15.5)
WBC: 16.6 10*3/uL — ABNORMAL HIGH (ref 4.0–10.5)

## 2013-08-11 ENCOUNTER — Telehealth: Payer: Self-pay | Admitting: Internal Medicine

## 2013-08-11 NOTE — Telephone Encounter (Signed)
QUESTIONS ABOUT UP COMING BLOOD WORK AND ETC.  [phone] ANITA (DAUGHTER)

## 2013-08-11 NOTE — Telephone Encounter (Signed)
Please obtain B12, CBC, Iron levels.,then decide if Iron infusion indicated.

## 2013-08-11 NOTE — Telephone Encounter (Signed)
Spoke with patient's daughter and patient is not feeling well. She is still weak. She is due for labs on 08/23/13. Denies SOB. Daughter is asking if she can have Sed rate drawn with CBC. Please, advise.

## 2013-08-12 ENCOUNTER — Other Ambulatory Visit: Payer: Self-pay | Admitting: *Deleted

## 2013-08-12 DIAGNOSIS — R5381 Other malaise: Secondary | ICD-10-CM

## 2013-08-12 DIAGNOSIS — D649 Anemia, unspecified: Secondary | ICD-10-CM

## 2013-08-12 DIAGNOSIS — R5383 Other fatigue: Secondary | ICD-10-CM

## 2013-08-12 NOTE — Telephone Encounter (Signed)
Spoke with patient's daughter and she will bring patient for labs.

## 2013-08-13 ENCOUNTER — Other Ambulatory Visit (INDEPENDENT_AMBULATORY_CARE_PROVIDER_SITE_OTHER): Payer: Medicare Other

## 2013-08-13 DIAGNOSIS — R5381 Other malaise: Secondary | ICD-10-CM | POA: Diagnosis not present

## 2013-08-13 DIAGNOSIS — Z862 Personal history of diseases of the blood and blood-forming organs and certain disorders involving the immune mechanism: Secondary | ICD-10-CM

## 2013-08-13 DIAGNOSIS — Z8639 Personal history of other endocrine, nutritional and metabolic disease: Secondary | ICD-10-CM

## 2013-08-13 DIAGNOSIS — R5383 Other fatigue: Secondary | ICD-10-CM

## 2013-08-13 DIAGNOSIS — R6889 Other general symptoms and signs: Secondary | ICD-10-CM | POA: Diagnosis not present

## 2013-08-13 DIAGNOSIS — D649 Anemia, unspecified: Secondary | ICD-10-CM

## 2013-08-13 LAB — CBC WITH DIFFERENTIAL/PLATELET
BASOS ABS: 0 10*3/uL (ref 0.0–0.1)
BASOS PCT: 0.4 % (ref 0.0–3.0)
Eosinophils Absolute: 0.1 10*3/uL (ref 0.0–0.7)
Eosinophils Relative: 0.8 % (ref 0.0–5.0)
HEMATOCRIT: 37 % (ref 36.0–46.0)
HEMOGLOBIN: 11.9 g/dL — AB (ref 12.0–15.0)
LYMPHS ABS: 1.5 10*3/uL (ref 0.7–4.0)
LYMPHS PCT: 13.5 % (ref 12.0–46.0)
MCHC: 32 g/dL (ref 30.0–36.0)
MCV: 79.9 fl (ref 78.0–100.0)
MONOS PCT: 4.8 % (ref 3.0–12.0)
Monocytes Absolute: 0.5 10*3/uL (ref 0.1–1.0)
NEUTROS ABS: 9 10*3/uL — AB (ref 1.4–7.7)
Neutrophils Relative %: 80.5 % — ABNORMAL HIGH (ref 43.0–77.0)
Platelets: 278 10*3/uL (ref 150.0–400.0)
RBC: 4.63 Mil/uL (ref 3.87–5.11)
RDW: 29.7 % — AB (ref 11.5–15.5)
WBC: 11.2 10*3/uL — ABNORMAL HIGH (ref 4.0–10.5)

## 2013-08-13 LAB — IBC PANEL
Iron: 61 ug/dL (ref 42–145)
Saturation Ratios: 16 % — ABNORMAL LOW (ref 20.0–50.0)
Transferrin: 271.5 mg/dL (ref 212.0–360.0)

## 2013-08-13 LAB — VITAMIN B12: Vitamin B-12: 435 pg/mL (ref 211–911)

## 2013-08-18 DIAGNOSIS — M353 Polymyalgia rheumatica: Secondary | ICD-10-CM | POA: Diagnosis not present

## 2013-08-18 DIAGNOSIS — D72829 Elevated white blood cell count, unspecified: Secondary | ICD-10-CM | POA: Diagnosis not present

## 2013-08-19 ENCOUNTER — Encounter: Payer: Self-pay | Admitting: *Deleted

## 2013-08-19 NOTE — Telephone Encounter (Signed)
error 

## 2013-08-19 NOTE — Telephone Encounter (Signed)
Message copied by Hulan Saas on Thu Aug 19, 2013  9:27 AM ------      Message from: Hulan Saas      Created: Fri Jul 23, 2013  3:12 PM       Call daughter [name] and remind her patient due for CBC for DB on 08/23/13. Lab in EPIC. ------

## 2013-08-20 ENCOUNTER — Other Ambulatory Visit: Payer: Self-pay | Admitting: Internal Medicine

## 2013-09-30 DIAGNOSIS — M545 Low back pain, unspecified: Secondary | ICD-10-CM | POA: Diagnosis not present

## 2013-09-30 DIAGNOSIS — G8929 Other chronic pain: Secondary | ICD-10-CM | POA: Diagnosis not present

## 2013-09-30 DIAGNOSIS — R5381 Other malaise: Secondary | ICD-10-CM | POA: Diagnosis not present

## 2013-09-30 DIAGNOSIS — D509 Iron deficiency anemia, unspecified: Secondary | ICD-10-CM | POA: Diagnosis not present

## 2013-09-30 DIAGNOSIS — M353 Polymyalgia rheumatica: Secondary | ICD-10-CM | POA: Diagnosis not present

## 2013-09-30 DIAGNOSIS — M48061 Spinal stenosis, lumbar region without neurogenic claudication: Secondary | ICD-10-CM | POA: Diagnosis not present

## 2013-09-30 DIAGNOSIS — R5383 Other fatigue: Secondary | ICD-10-CM | POA: Diagnosis not present

## 2013-11-02 DIAGNOSIS — D690 Allergic purpura: Secondary | ICD-10-CM | POA: Diagnosis not present

## 2013-11-02 DIAGNOSIS — L738 Other specified follicular disorders: Secondary | ICD-10-CM | POA: Diagnosis not present

## 2013-11-02 DIAGNOSIS — C44319 Basal cell carcinoma of skin of other parts of face: Secondary | ICD-10-CM | POA: Diagnosis not present

## 2013-11-02 DIAGNOSIS — D485 Neoplasm of uncertain behavior of skin: Secondary | ICD-10-CM | POA: Diagnosis not present

## 2013-11-02 DIAGNOSIS — D692 Other nonthrombocytopenic purpura: Secondary | ICD-10-CM | POA: Diagnosis not present

## 2013-11-02 DIAGNOSIS — Z85828 Personal history of other malignant neoplasm of skin: Secondary | ICD-10-CM | POA: Diagnosis not present

## 2013-11-15 DIAGNOSIS — C44319 Basal cell carcinoma of skin of other parts of face: Secondary | ICD-10-CM | POA: Diagnosis not present

## 2013-12-16 ENCOUNTER — Other Ambulatory Visit: Payer: Self-pay | Admitting: Internal Medicine

## 2013-12-30 DIAGNOSIS — D509 Iron deficiency anemia, unspecified: Secondary | ICD-10-CM | POA: Diagnosis not present

## 2013-12-30 DIAGNOSIS — M4806 Spinal stenosis, lumbar region: Secondary | ICD-10-CM | POA: Diagnosis not present

## 2013-12-30 DIAGNOSIS — M545 Low back pain: Secondary | ICD-10-CM | POA: Diagnosis not present

## 2013-12-30 DIAGNOSIS — J4550 Severe persistent asthma, uncomplicated: Secondary | ICD-10-CM | POA: Diagnosis not present

## 2013-12-30 DIAGNOSIS — Z23 Encounter for immunization: Secondary | ICD-10-CM | POA: Diagnosis not present

## 2013-12-30 DIAGNOSIS — M353 Polymyalgia rheumatica: Secondary | ICD-10-CM | POA: Diagnosis not present

## 2014-01-03 ENCOUNTER — Ambulatory Visit (INDEPENDENT_AMBULATORY_CARE_PROVIDER_SITE_OTHER): Payer: Medicare Other | Admitting: Internal Medicine

## 2014-01-03 ENCOUNTER — Encounter: Payer: Self-pay | Admitting: Internal Medicine

## 2014-01-03 ENCOUNTER — Ambulatory Visit (INDEPENDENT_AMBULATORY_CARE_PROVIDER_SITE_OTHER)
Admission: RE | Admit: 2014-01-03 | Discharge: 2014-01-03 | Disposition: A | Discharge status: Home / Self Care | Payer: Medicare Other | Source: Ambulatory Visit | Attending: Internal Medicine | Admitting: Internal Medicine

## 2014-01-03 ENCOUNTER — Encounter (INDEPENDENT_AMBULATORY_CARE_PROVIDER_SITE_OTHER): Payer: Self-pay

## 2014-01-03 VITALS — BP 140/60 | HR 76 | Temp 97.6°F | Ht 63.0 in | Wt 205.0 lb

## 2014-01-03 DIAGNOSIS — R0602 Shortness of breath: Secondary | ICD-10-CM | POA: Diagnosis not present

## 2014-01-03 DIAGNOSIS — I10 Essential (primary) hypertension: Secondary | ICD-10-CM | POA: Diagnosis not present

## 2014-01-03 DIAGNOSIS — R0600 Dyspnea, unspecified: Secondary | ICD-10-CM

## 2014-01-03 DIAGNOSIS — Z79899 Other long term (current) drug therapy: Secondary | ICD-10-CM | POA: Diagnosis not present

## 2014-01-03 DIAGNOSIS — R058 Other specified cough: Secondary | ICD-10-CM

## 2014-01-03 DIAGNOSIS — R05 Cough: Secondary | ICD-10-CM | POA: Diagnosis not present

## 2014-01-03 DIAGNOSIS — J4540 Moderate persistent asthma, uncomplicated: Secondary | ICD-10-CM

## 2014-01-03 DIAGNOSIS — J45909 Unspecified asthma, uncomplicated: Secondary | ICD-10-CM | POA: Diagnosis not present

## 2014-01-03 MED ORDER — METHYLPREDNISOLONE ACETATE 80 MG/ML IJ SUSP
120.0000 mg | Freq: Once | INTRAMUSCULAR | Status: AC
  Administered 2014-01-03: 120 mg via INTRAMUSCULAR

## 2014-01-03 NOTE — Patient Instructions (Addendum)
Work on inhaler technique:  relax and gently blow all the way out then take a nice smooth deep breath back in, triggering the inhaler at same time you start breathing in.  Hold for up to 5 seconds if you can.  Rinse and gargle with water when done  Nexim  40 mg   Take 30-60 min before first meal of the day and Pepcid 20 mg one bedtime until return to office - this is the best way to tell whether stomach acid is contributing to your problem.  Also take two chlorpheniramine at bedtime   Only use your albuterol(proventil/proair) as a rescue medication to be used if you can't catch your breath by resting or doing a relaxed purse lip breathing pattern.  - The less you use it, the better it will work when you need it. - Ok to use up to 2 puffs  every 4 hours if you must but call for immediate appointment if use goes up over your usual need - Don't leave home without it !!  (think of it like the spare tire for your car)   For drainage take chlortrimeton (chlorpheniramine) 4 mg every 4 hours available over the counter (may cause drowsiness)   GERD (REFLUX)  is an extremely common cause of respiratory symptoms, many times with no significant heartburn at all.    It can be treated with medication, but also with lifestyle changes including avoidance of late meals, excessive alcohol, smoking cessation, and avoid fatty foods, chocolate, peppermint, colas, red wine, and acidic juices such as orange juice.  NO MINT OR MENTHOL PRODUCTS SO NO COUGH DROPS  USE SUGARLESS CANDY INSTEAD (jolley ranchers or Stover's)  NO OIL BASED VITAMINS - use powdered substitutes.    See Tammy NP in  2 weeks with all your medications, even over the counter meds, separated in two separate bags, the ones you take no matter what vs the ones you stop once you feel better and take only as needed when you feel you need them.   Tammy  will generate for you a new user friendly medication calendar that will put [place] all on the same page re:  your medication use.     Without this process, it simply isn't possible to assure that we are providing  your outpatient care  with  the attention to detail we feel you deserve.   If we cannot assure that you're getting that kind of care,  then we cannot manage your problem effectively from this clinic.  Once you have seen Tammy and we are sure that we're all on the same page with your medication use she will arrange follow up with me.  Late add: if masters hfa need to see which ones are avail on her formulary, if not consider laba/ICS neb

## 2014-01-03 NOTE — Progress Notes (Signed)
Subjective:    Patient ID: [name], female    DOB: 1929-07-15   MRN: [id number]    Brief patient profile:  10 yowf never smoker no resp or allergy problems until 2011 onset referred by Dr Modena Morrow  Pulmonary clinic for cough and sob with dx of asthma by pft's 11/30/12   History of Present Illness  10/14/2012 1st pulmonary eval cc variable doe x 3 years  assoc mostly dry cough now daily x across the house much better when on prednisone last took July 14th off advair due to mouth irritation but not clear it really helped the breathing that much, assoc with overt HB and chest discomfort at rest and hs but not with ex.albuterol helps some  >PPI /Pepcid  D/c fish oil   10/29/2012 follow up and med review  Patient returns for followup and medication review.   reports DOE is 40% improved since last ov.  no new complaints. We reviewed all her medications and organized them into a medication calendar with patient education. Patient appears to be taking her medications correctly. Patient continues to complain of postnasal drip and drainage. Dry cough and dyspnea. Are slightly improved. rec Add Chlortrimeton 4mg  2  At bedtime   Take Allegra 180mg  daily in am .  Increase Astelin 2 puffs Twice daily   May use Mucinex As needed  For congestion/thick mucus May use Delsym 2 tsp Twice daily  For cough As needed   GOAL IS TO NOT COUGH OR CLEAR THROAT.  NO MINTS . GERD DIET Follow med calendar closely and bring to each visit  11/30/2012 f/u ov/Soren Pigman re cough and sob Chief Complaint  Patient presents with  . Follow-up    pt reports breathing is much better-- denies any new concerns at this time   using lots of saba to control sob and cough mostly dry rec symbicort 80 2 puffs first thing in am and then another 2 puffs about 12 hours later.    01/11/2013 f/u ov/Akif Weldy re: chronic asthma Chief Complaint  Patient presents with  . Follow-up    Breathing improved since last OV.  much better on  symbicort 80 2bid with no limiting sob, cough, no need for saba  rec Either use dulera 100 or symbicort 80 Take 2 puffs first thing in am and then another 2 puffs about 12 hours later.  Only use your albuterol prn   07/02/13 polypectomy s abd pain or obvious bleeding     07/13/2013 acute extended  ov/Malijah Lietz re: recurrent sob x 4 days Chief Complaint  Patient presents with  . Acute Visit    Pt c/o DOE for the past 4 days. She states that she can not walk across the room without getting SOB. She states that she feels very fatigued and has no energy. She states that she has been using her rescue inhaler 2 to 3 times per day.    Was not needing any saba all. Ok at hs > feels fine when lies down"that's all I want to do" Only problem is with walking - ok sitting Sense of Drainage not new >no purulent sputum  hfa ok, does not relieve sense of can't get a deep breath/ congestion. rec Check labs> severe anemia corrected on Fe > hgb 14.9 12/30/13   01/03/2014 reconsult/Claretta Kendra re:sob   Chief Complaint  Patient presents with  . Follow-up    Pt advised by Dr. Modena Morrow to f/u for worsening asthma. Pt c/o having increased SOB for the past few  months. She is using her rescue inhaler 2-3 times per day.   even when better better could not do house work, no cough, still using albuterol maybe once a week Then July 2015 started getting worse doe assoc hacky cough dry, not worse sleeping but aware of sensation of drainage s excess or purulent mucus production, started using more saba but not clear it's really helping (poor hfa/ see a/p). Thinks was better on symbicort but couldn't get it thru her insurance company.      No obvious day to day or daytime variabilty or assoc cp   subjective wheeze overt sinus or hb symptoms. No unusual exp hx or h/o childhood pna/ asthma or knowledge of premature birth.  Sleeping ok without nocturnal  or early am exacerbation  of respiratory  c/o's or need for noct saba. Also  denies any obvious fluctuation of symptoms with weather or environmental changes or other aggravating or alleviating factors except as outlined above   Current Medications, Allergies, Complete Past Medical History, Past Surgical History, Family History, and Social History were reviewed in [employer] [employer] record.  ROS  The following are not active complaints unless bolded sore throat, dysphagia, dental problems, itching, sneezing,  nasal congestion or excess/ purulent secretions, ear ache,   fever, chills, sweats, unintended wt loss, pleuritic or exertional cp, hemoptysis,  orthopnea pnd or leg swelling, presyncope, palpitations, heartburn, abdominal pain, anorexia, nausea, vomiting, diarrhea  or change in bowel or urinary habits, change in stools or urine, dysuria,hematuria,  rash, arthralgias, visual complaints, headache, numbness weakness or ataxia or problems with walking or coordination,  change in mood/affect or memory.           Objective:   Physical Exam   Obese mod  hoarse amb wf nad   07/13/2013        205 >  01/03/2014  205  Wt Readings from Last 3 Encounters:  01/11/13 199 lb (90.266 kg)  11/30/12 197 lb 9.6 oz (89.631 kg)  10/29/12 199 lb 3.2 oz (90.357 kg)      HEENT: nl dentition, turbinates, and orophanx. Nl external ear canals without cough reflex   NECK :  without JVD/Nodes/TM/ nl carotid upstrokes bilaterally   LUNGS: no acc muscle use, clear to A and P bilaterally without cough on insp or exp maneuvers   CV:  RRR  no s3 or murmur or increase in P2, slt swelling R leg   ABD:  soft and nontender with nl excursion in the supine position. No bruits or organomegaly, bowel sounds nl  MS:  warm without deformities, calf tenderness, cyanosis or clubbing  SKIN: warm and dry without lesions               CXR  01/03/2014 : No active cardiopulmonary disease.      Assessment & Plan:

## 2014-01-04 DIAGNOSIS — R05 Cough: Secondary | ICD-10-CM | POA: Insufficient documentation

## 2014-01-04 DIAGNOSIS — R058 Other specified cough: Secondary | ICD-10-CM | POA: Insufficient documentation

## 2014-01-04 NOTE — Assessment & Plan Note (Addendum)
07/13/2013   Walked RA x one lap @ 185 stopped due to  Sob no desat - 01/03/2014   Walked RA x one lap @ 185 stopped due to  Sob, no desat, slow pace  Will focus on rx of airways for now but if not clear as to cause of sob will need broader scope w/u on return.

## 2014-01-04 NOTE — Addendum Note (Signed)
Addended by: Christinia Gully B on: 01/04/2014 07:43 AM   Modules accepted: Orders

## 2014-01-04 NOTE — Assessment & Plan Note (Signed)
Note onset of recurrent sob with dry hacky cough and sensation of pnds. This is typical of  Classic Upper airway cough syndrome, so named because it's frequently impossible to sort out how much is  CR/sinusitis with freq throat clearing (which can be related to primary GERD)   vs  causing  secondary (" extra esophageal")  GERD from wide swings in gastric pressure that occur with throat clearing, often  promoting self use of mint and menthol lozenges that reduce the lower esophageal sphincter tone and exacerbate the problem further in a cyclical fashion.   These are the same pts (now being labeled as having "irritable larynx syndrome" by some cough centers) who not infrequently have a history of having failed to tolerate ace inhibitors,  dry powder inhalers or biphosphonates or report having atypical reflux symptoms that don't respond to standard doses of PPI , and are easily confused as having aecopd or asthma flares by even experienced allergists/ pulmonologists.  So need to rx gerd, avoid acei and dpi inhalers, and add 1st gen H1 per guidelines  See instructions for specific recommendations which were reviewed directly with the patient who was given a copy with highlighter outlining the key components.

## 2014-01-04 NOTE — Assessment & Plan Note (Addendum)
-   PFT's 11/27/12 FEV1  1.10 ( 59%) ratio 56 and 24 %  Better p B2, dlco 73 corrects to 95  - 01/03/2014 p extensive coaching HFA effectiveness =    75%    DDX of  difficult airways management all start with A and  include Adherence, Ace Inhibitors, Acid Reflux, Active Sinus Disease, Alpha 1 Antitripsin deficiency, Anxiety masquerading as Airways dz,  ABPA,  allergy(esp in young), Aspiration (esp in elderly), Adverse effects of DPI,  Active smokers, plus two Bs  = Bronchiectasis and Beta blocker use..and one C= CHF  Adherence is always the initial "prime suspect" and is a multilayered concern that requires a "trust but verify" approach in every patient - starting with knowing how to use medications, especially inhalers, correctly, keeping up with refills and understanding the fundamental difference between maintenance and prns vs those medications only taken for a very short course and then stopped and not refilled.  The proper method of use, as well as anticipated side effects, of a metered-dose inhaler are discussed and demonstrated to the patient. Improved effectiveness after extensive coaching during this visit to a level of approximately  75% though baseline only 25 % so not clear how much she has really been saba dependent  ? Acid (or non-acid) GERD > always difficult to exclude as up to 75% of pts in some series report no assoc GI/ Heartburn symptoms> rec max (24h)  acid suppression and diet restrictions/ reviewed and instructions given in writing.   ? Adverse effects of dpi > leave off all dpi inhalers for now (see uacs)   ? Allergic component > depomedrol 120 IM today and try leaving off ICS until sort out true need / formulary alternatives   Will bring her back for a trust but verify visit -  To keep things simple, I have asked the patient to first separate medicines that are perceived as maintenance, that is to be taken daily "no matter what", from those medicines that are taken on only on an  as-needed basis and I have given the patient examples of both, and then return to see our NP to generate a  detailed  medication calendar which should be followed until the next physician sees the patient and updates it.

## 2014-01-04 NOTE — Progress Notes (Signed)
Quick Note:  LMTCB ______ 

## 2014-01-10 ENCOUNTER — Telehealth: Payer: Self-pay | Admitting: Internal Medicine

## 2014-01-10 NOTE — Telephone Encounter (Signed)
Spoke with pt's EC [name], she stated that MW had d/c'ed Symbicort at 10/19 visit, but they received a shipment of symbicort last Friday.  I advised pt that symbicort had been d/c'ed on pt's med list at last visit, and often times the medication will take a few days to ship once it's been sent, so it's possible that the medication had just been shipped already.  Pt is aware to stop symbicort, verified rov with TP.  Nothing further needed at this time.

## 2014-01-18 ENCOUNTER — Ambulatory Visit (INDEPENDENT_AMBULATORY_CARE_PROVIDER_SITE_OTHER): Payer: Medicare Other | Admitting: Adult Health

## 2014-01-18 ENCOUNTER — Encounter: Payer: Self-pay | Admitting: Adult Health

## 2014-01-18 VITALS — BP 128/66 | HR 85 | Temp 97.1°F | Ht 63.0 in | Wt 199.0 lb

## 2014-01-18 DIAGNOSIS — J4540 Moderate persistent asthma, uncomplicated: Secondary | ICD-10-CM | POA: Diagnosis not present

## 2014-01-18 NOTE — Progress Notes (Signed)
Subjective:    Patient ID: [name], female    DOB: February 24, 1930   MRN: [id number]    Brief patient profile:  53 yowf never smoker no resp or allergy problems until 2011 onset referred by Dr Modena Morrow  Pulmonary clinic for cough and sob with dx of asthma by pft's 11/30/12   History of Present Illness  10/14/2012 1st pulmonary eval cc variable doe x 3 years  assoc mostly dry cough now daily x across the house much better when on prednisone last took July 14th off advair due to mouth irritation but not clear it really helped the breathing that much, assoc with overt HB and chest discomfort at rest and hs but not with ex.albuterol helps some  >PPI /Pepcid  D/c fish oil   10/29/2012 follow up and med review  Patient returns for followup and medication review.   reports DOE is 40% improved since last ov.  no new complaints. We reviewed all her medications and organized them into a medication calendar with patient education. Patient appears to be taking her medications correctly. Patient continues to complain of postnasal drip and drainage. Dry cough and dyspnea. Are slightly improved. rec Add Chlortrimeton 4mg  2  At bedtime   Take Allegra 180mg  daily in am .  Increase Astelin 2 puffs Twice daily   May use Mucinex As needed  For congestion/thick mucus May use Delsym 2 tsp Twice daily  For cough As needed   GOAL IS TO NOT COUGH OR CLEAR THROAT.  NO MINTS . GERD DIET Follow med calendar closely and bring to each visit  11/30/2012 f/u ov/Wert re cough and sob Chief Complaint  Patient presents with  . Follow-up    pt reports breathing is much better-- denies any new concerns at this time   using lots of saba to control sob and cough mostly dry rec symbicort 80 2 puffs first thing in am and then another 2 puffs about 12 hours later.    01/11/2013 f/u ov/Wert re: chronic asthma Chief Complaint  Patient presents with  . Follow-up    Breathing improved since last OV.  much better on  symbicort 80 2bid with no limiting sob, cough, no need for saba  rec Either use dulera 100 or symbicort 80 Take 2 puffs first thing in am and then another 2 puffs about 12 hours later.  Only use your albuterol prn   07/02/13 polypectomy s abd pain or obvious bleeding     07/13/2013 acute extended  ov/Wert re: recurrent sob x 4 days Chief Complaint  Patient presents with  . Acute Visit    Pt c/o DOE for the past 4 days. She states that she can not walk across the room without getting SOB. She states that she feels very fatigued and has no energy. She states that she has been using her rescue inhaler 2 to 3 times per day.    Was not needing any saba all. Ok at hs > feels fine when lies down"that's all I want to do" Only problem is with walking - ok sitting Sense of Drainage not new >no purulent sputum  hfa ok, does not relieve sense of can't get a deep breath/ congestion. rec Check labs> severe anemia corrected on Fe > hgb 14.9 12/30/13   01/03/2014 reconsult/Wert re:sob   Chief Complaint  Patient presents with  . Follow-up    Pt advised by Dr. Modena Morrow to f/u for worsening asthma. Pt c/o having increased SOB for the past few  months. She is using her rescue inhaler 2-3 times per day.   even when better better could not do house work, no cough, still using albuterol maybe once a week Then July 2015 started getting worse doe assoc hacky cough dry, not worse sleeping but aware of sensation of drainage s excess or purulent mucus production, started using more saba but not clear it's really helping (poor hfa/ see a/p). Thinks was better on symbicort but couldn't get it thru her insurance company.  >>same rx   01/18/2014 Follow up and Med Review  Patient returns for two-week follow-up and medication review. Reviewed all her medications organize them into a medication calendar with patient education Appears she is taking her medications correctly Last visit was continued on her Symbicort.  However , says Can not take Symbicort it causes lip peeling. Says it made her raw on inside of throat.  Says she has tried other inhalers in the past with the same symptoms She denies any flare of her cough, wheezing or shortness of breath   Current Medications, Allergies, Complete Past Medical History, Past Surgical History, Family History, and Social History were reviewed in [employer] record.  ROS  The following are not active complaints unless bolded sore throat, dysphagia, dental problems, itching, sneezing,  nasal congestion or excess/ purulent secretions, ear ache,   fever, chills, sweats, unintended wt loss, pleuritic or exertional cp, hemoptysis,  orthopnea pnd or leg swelling, presyncope, palpitations, heartburn, abdominal pain, anorexia, nausea, vomiting, diarrhea  or change in bowel or urinary habits, change in stools or urine, dysuria,hematuria,  rash, arthralgias, visual complaints, headache, numbness weakness or ataxia or problems with walking or coordination,  change in mood/affect or memory.           Objective:   Physical Exam   Obese mod  wf nad   07/13/2013        205 >  01/03/2014  205 >  199 01/18/2014   HEENT: nl dentition, turbinates, and orophanx. Nl external ear canals without cough reflex   NECK :  without JVD/Nodes/TM/ nl carotid upstrokes bilaterally   LUNGS: no acc muscle use, clear to A and P bilaterally without cough on insp or exp maneuvers   CV:  RRR  no s3 or murmur or increase in P2, slt swelling R leg   ABD:  soft and nontender with nl excursion in the supine position. No bruits or organomegaly, bowel sounds nl  MS:  warm without deformities, calf tenderness, cyanosis or clubbing  SKIN: warm and dry without lesions               CXR  01/03/2014 : No active cardiopulmonary disease.      Assessment & Plan:

## 2014-01-18 NOTE — Patient Instructions (Signed)
Continue on current regimen .  Keep up good work .  GOAL IS TO NOT COUGH OR CLEAR THROAT.  NO MINTS . GERD DIET Follow med calendar closely and bring to each visit.  Please contact office for sooner follow up if symptoms do not improve or worsen or seek emergency care  Follow up Dr. Melvyn Novas  In 4-6 weeks and As needed

## 2014-01-20 NOTE — Assessment & Plan Note (Signed)
Chronic Asthma no sign flare off controller.  Has had difficulty with ICS inhalers  Patient's medications were reviewed today and patient education was given. Computerized medication calendar was adjusted/completed  May need to try low dose QVAR on return   Plan  Continue on current regimen .  Keep up good work .  GOAL IS TO NOT COUGH OR CLEAR THROAT.  NO MINTS . GERD DIET Follow med calendar closely and bring to each visit.  Please contact office for sooner follow up if symptoms do not improve or worsen or seek emergency care  Follow up Dr. Melvyn Novas  In 4-6 weeks and As needed

## 2014-01-27 DIAGNOSIS — Z5181 Encounter for therapeutic drug level monitoring: Secondary | ICD-10-CM | POA: Diagnosis not present

## 2014-01-27 DIAGNOSIS — Z7189 Other specified counseling: Secondary | ICD-10-CM | POA: Diagnosis not present

## 2014-02-04 NOTE — Addendum Note (Signed)
Addended by: Parke Poisson E on: 02/04/2014 12:43 PM   Modules accepted: Orders, Medications

## 2014-02-22 ENCOUNTER — Ambulatory Visit (INDEPENDENT_AMBULATORY_CARE_PROVIDER_SITE_OTHER): Payer: Medicare Other | Admitting: Internal Medicine

## 2014-02-22 ENCOUNTER — Encounter: Payer: Self-pay | Admitting: Internal Medicine

## 2014-02-22 ENCOUNTER — Encounter (INDEPENDENT_AMBULATORY_CARE_PROVIDER_SITE_OTHER): Payer: Self-pay

## 2014-02-22 VITALS — BP 118/70 | HR 63 | Ht 63.0 in | Wt 198.0 lb

## 2014-02-22 DIAGNOSIS — J4540 Moderate persistent asthma, uncomplicated: Secondary | ICD-10-CM

## 2014-02-22 DIAGNOSIS — R0600 Dyspnea, unspecified: Secondary | ICD-10-CM

## 2014-02-22 MED ORDER — ALBUTEROL SULFATE HFA 108 (90 BASE) MCG/ACT IN AERS: INHALATION_SPRAY | RESPIRATORY_TRACT | Status: DC

## 2014-02-22 NOTE — Assessment & Plan Note (Signed)
-   intol of ICS - PFT's 11/27/12 FEV1  1.10 ( 59%) ratio 56 and 24 %  Better p B2, dlco 73 corrects to 95   The proper method of use, as well as anticipated side effects, of a metered-dose inhaler are discussed and demonstrated to the patient. Improved effectiveness after extensive coaching during this visit to a level of approximately  90%  I had an extended summary  discussion with the patient reviewing all relevant studies completed to date and  lasting 15 to 20 minutes of a 25 minute visit on the following ongoing concerns:   She has moderate asthma but can't use ICS but doing fine s need for saba so ok to leave well enough alone but add singulair trial if flares

## 2014-02-22 NOTE — Assessment & Plan Note (Signed)
07/13/2013   Walked RA x one lap @ 185 stopped due to  Sob no desat - 01/03/2014   Walked RA x one lap @ 185 stopped due to  Sob, no desat, slow pace - 02/22/2014  Walked RA  2 laps @ 185 ft each stopped due to  Sob/ 90% sat  @ nl apce  Ex tol has improved despite very little pulmonary rx (prn saba)  While on max rx for GERD/ pnds >  Key longterm is wt loss, no further pulmonary f/u needed     Each maintenance medication was reviewed in detail including most importantly the difference between maintenance and as needed and under what circumstances the prns are to be used.  Please see instructions for details which were reviewed in writing and the patient given a copy.

## 2014-02-22 NOTE — Patient Instructions (Addendum)
Next time you refill your inhaler use proair instead of proventil   Only use your albuterol(proventil=proair)  as a rescue medication to be used if you can't catch your breath by resting or doing a relaxed purse lip breathing pattern.  - The less you use it, the better it will work when you need it. - Ok to use up to 2 puffs  every 4 hours if you must but call for immediate appointment if use goes up over your usual need - Don't leave home without it !!  (think of it like the spare tire for your car)    If you are satisfied with your treatment plan,  let your doctor know and he/she can either refill your medications or you can return here when your prescription runs out.     If in any way you are not 100% satisfied,  please tell us.  If 100% better, tell your friends!  Pulmonary follow up is as needed   Late add : consider trial of singulair if falres

## 2014-02-22 NOTE — Progress Notes (Signed)
Subjective:    Patient ID: [name], female    DOB: 11-14-1929   MRN: [id number]    Brief patient profile:  23 yowf never smoker no resp or allergy problems until 2011 onset referred 10/14/12 by Dr Modena Morrow  Pulmonary clinic for cough and sob with dx of mod persistent asthma by pft's 11/30/12   History of Present Illness  10/14/2012 1st pulmonary eval cc variable doe x 3 years  assoc mostly dry cough now daily x across the house much better when on prednisone last took July 14th off advair due to mouth irritation but not clear it really helped the breathing that much, assoc with overt HB and chest discomfort at rest and hs but not with ex.albuterol helps some  >PPI /Pepcid  D/c fish oil   10/29/2012 follow up and med review  Patient returns for followup and medication review.   reports DOE is 40% improved since last ov.  no new complaints. We reviewed all her medications and organized them into a medication calendar with patient education. Patient appears to be taking her medications correctly. Patient continues to complain of postnasal drip and drainage. Dry cough and dyspnea. Are slightly improved. rec Add Chlortrimeton 4mg  2  At bedtime   Take Allegra 180mg  daily in am .  Increase Astelin 2 puffs Twice daily   May use Mucinex As needed  For congestion/thick mucus May use Delsym 2 tsp Twice daily  For cough As needed   GOAL IS TO NOT COUGH OR CLEAR THROAT.  NO MINTS . GERD DIET Follow med calendar closely and bring to each visit  11/30/2012 f/u ov/[name] re cough and sob Chief Complaint  Patient presents with  . Follow-up    pt reports breathing is much better-- denies any new concerns at this time   using lots of saba to control sob and cough mostly dry rec symbicort 80 2 puffs first thing in am and then another 2 puffs about 12 hours later.    01/11/2013 f/u ov/[name] re: chronic asthma Chief Complaint  Patient presents with  . Follow-up    Breathing improved since last OV.   much better on symbicort 80 2bid with no limiting sob, cough, no need for saba  rec Either use dulera 100 or symbicort 80 Take 2 puffs first thing in am and then another 2 puffs about 12 hours later.  Only use your albuterol prn   07/02/13 polypectomy s abd pain or obvious bleeding     07/13/2013 acute extended  ov/[name] re: recurrent sob x 4 days Chief Complaint  Patient presents with  . Acute Visit    Pt c/o DOE for the past 4 days. She states that she can not walk across the room without getting SOB. She states that she feels very fatigued and has no energy. She states that she has been using her rescue inhaler 2 to 3 times per day.    Was not needing any saba all. Ok at hs > feels fine when lies down"that's all I want to do" Only problem is with walking - ok sitting Sense of Drainage not new >no purulent sputum  hfa ok, does not relieve sense of can't get a deep breath/ congestion. rec Check labs> severe anemia corrected on Fe > hgb 14.9 12/30/13   01/18/2014 Follow up and Med Review /NP Patient returns for two-week follow-up and medication review. Reviewed all her medications organize them into a medication calendar with patient education Appears she is taking her  medications correctly Last visit was continued on her Symbicort. However , says Can not take Symbicort it causes lip peeling. Says it made her raw on inside of throat.  Says she has tried other inhalers in the past with the same symptoms rec Continue on current regimen .  Keep up good work .  GOAL IS TO NOT COUGH OR CLEAR THROAT.  NO MINTS . GERD DIET Follow med calendar closely and bring to each visit.   02/22/2014 f/u ov/[name] re: asthma/ mod persistent / brought wrong calendar  Chief Complaint  Patient presents with  . Follow-up    Pt states that her breathing is much improved.  She has only used rescue inhaler x 2 since the last visit.   has apparently ever tried singulair and can't use ics but doing better  with rx directed at gerd and pnds and Not limited by breathing from desired activities    No obvious day to day or daytime variabilty or assoc chronic cough or cp or chest tightness, subjective wheeze overt sinus or hb symptoms. No unusual exp hx or h/o childhood pna/ asthma or knowledge of premature birth.  Sleeping ok without nocturnal  or early am exacerbation  of respiratory  c/o's or need for noct saba. Also denies any obvious fluctuation of symptoms with weather or environmental changes or other aggravating or alleviating factors except as outlined above   Current Medications, Allergies, Complete Past Medical History, Past Surgical History, Family History, and Social History were reviewed in [employer] record.  ROS  The following are not active complaints unless bolded sore throat, dysphagia, dental problems, itching, sneezing,  nasal congestion or excess/ purulent secretions, ear ache,   fever, chills, sweats, unintended wt loss, pleuritic or exertional cp, hemoptysis,  orthopnea pnd or leg swelling, presyncope, palpitations, heartburn, abdominal pain, anorexia, nausea, vomiting, diarrhea  or change in bowel or urinary habits, change in stools or urine, dysuria,hematuria,  rash, arthralgias, visual complaints, headache, numbness weakness or ataxia or problems with walking or coordination,  change in mood/affect or memory.                    Objective:   Physical Exam   Obese mod  wf nad    07/13/2013        205 >  01/03/2014  205 >  199 01/18/2014 > 02/22/2014  198  HEENT: nl dentition, turbinates, and orophanx. Nl external ear canals without cough reflex   NECK :  without JVD/Nodes/TM/ nl carotid upstrokes bilaterally   LUNGS: no acc muscle use, clear to A and P bilaterally without cough on insp or exp maneuvers   CV:  RRR  no s3 or murmur or increase in P2, slt swelling R leg   ABD:  soft and nontender with nl excursion in the supine position. No  bruits or organomegaly, bowel sounds nl  MS:  warm without deformities, calf tenderness, cyanosis or clubbing  SKIN: warm and dry without lesions               CXR  01/03/2014 : No active cardiopulmonary disease.      Assessment & Plan:   Outpatient Encounter Prescriptions as of 02/22/2014  Medication Sig  . amLODipine-olmesartan (AZOR) 10-40 MG per tablet Take 1 tablet by mouth at bedtime.  Marland Kitchen aspirin 81 MG tablet Take 81 mg by mouth daily.    Marland Kitchen azelastine (ASTELIN) 137 MCG/SPRAY nasal spray Place 2 sprays into both nostrils at bedtime  as needed. Use in each nostril as directed  . celecoxib (CELEBREX) 100 MG capsule Take 100 mg by mouth daily as needed.  . chlorpheniramine (CHLOR-TRIMETON) 4 MG tablet 2 tabs by mouth at bedtime.  May add 1 every 4 hours as needed for drippy nose, drainage, throat clearing.  Marland Kitchen dextromethorphan (DELSYM) 30 MG/5ML liquid 2 tsp twice daily as needed for cough  . diazepam (VALIUM) 5 MG tablet Take 1/2 tablet by mouth twice daily as needed for back spasms   . esomeprazole (NEXIUM) 40 MG capsule Take 40 mg by mouth daily.   . famotidine (PEPCID) 20 MG tablet 1 tablet at bedtime  . ferrous sulfate 325 (65 FE) MG tablet Take 1 tablet (325 mg total) by mouth 3 (three) times daily with meals.  Marland Kitchen FLUoxetine (PROZAC) 20 MG capsule Take 20 mg by mouth daily.  Marland Kitchen oxycodone (OXY-IR) 5 MG capsule Take 1/2-1 tablet by mouth every 8 hours as needed for severe back pain  . [DISCONTINUED] albuterol (PROVENTIL,VENTOLIN) 90 MCG/ACT inhaler Inhale 2 puffs into the lungs every 4 (four) hours as needed for wheezing or shortness of breath.   Marland Kitchen albuterol (PROAIR HFA) 108 (90 BASE) MCG/ACT inhaler 2 puffs up to every 4 hours if can't catch your breath

## 2014-03-29 DIAGNOSIS — J3089 Other allergic rhinitis: Secondary | ICD-10-CM | POA: Diagnosis not present

## 2014-03-29 DIAGNOSIS — M545 Low back pain: Secondary | ICD-10-CM | POA: Diagnosis not present

## 2014-03-29 DIAGNOSIS — G8929 Other chronic pain: Secondary | ICD-10-CM | POA: Diagnosis not present

## 2014-03-29 DIAGNOSIS — D509 Iron deficiency anemia, unspecified: Secondary | ICD-10-CM | POA: Diagnosis not present

## 2014-03-29 DIAGNOSIS — M353 Polymyalgia rheumatica: Secondary | ICD-10-CM | POA: Diagnosis not present

## 2014-03-29 DIAGNOSIS — J4520 Mild intermittent asthma, uncomplicated: Secondary | ICD-10-CM | POA: Diagnosis not present

## 2014-05-10 DIAGNOSIS — M353 Polymyalgia rheumatica: Secondary | ICD-10-CM | POA: Diagnosis not present

## 2014-05-10 DIAGNOSIS — R300 Dysuria: Secondary | ICD-10-CM | POA: Diagnosis not present

## 2014-05-10 DIAGNOSIS — R0602 Shortness of breath: Secondary | ICD-10-CM | POA: Diagnosis not present

## 2014-05-12 DIAGNOSIS — R9431 Abnormal electrocardiogram [ECG] [EKG]: Secondary | ICD-10-CM | POA: Diagnosis not present

## 2014-05-30 DIAGNOSIS — M353 Polymyalgia rheumatica: Secondary | ICD-10-CM | POA: Diagnosis not present

## 2014-05-30 DIAGNOSIS — R399 Unspecified symptoms and signs involving the genitourinary system: Secondary | ICD-10-CM | POA: Diagnosis not present

## 2014-05-30 DIAGNOSIS — Z0000 Encounter for general adult medical examination without abnormal findings: Secondary | ICD-10-CM | POA: Diagnosis not present

## 2014-06-28 DIAGNOSIS — Z0000 Encounter for general adult medical examination without abnormal findings: Secondary | ICD-10-CM | POA: Diagnosis not present

## 2014-06-28 DIAGNOSIS — M545 Low back pain: Secondary | ICD-10-CM | POA: Diagnosis not present

## 2014-06-28 DIAGNOSIS — G8929 Other chronic pain: Secondary | ICD-10-CM | POA: Diagnosis not present

## 2014-06-28 DIAGNOSIS — M4806 Spinal stenosis, lumbar region: Secondary | ICD-10-CM | POA: Diagnosis not present

## 2014-06-28 DIAGNOSIS — M353 Polymyalgia rheumatica: Secondary | ICD-10-CM | POA: Diagnosis not present

## 2014-07-01 ENCOUNTER — Encounter: Payer: Self-pay | Admitting: Internal Medicine

## 2014-07-01 ENCOUNTER — Ambulatory Visit (INDEPENDENT_AMBULATORY_CARE_PROVIDER_SITE_OTHER): Payer: Medicare Other | Admitting: Internal Medicine

## 2014-07-01 VITALS — BP 138/64 | HR 82 | Ht 63.0 in | Wt 203.0 lb

## 2014-07-01 DIAGNOSIS — R0600 Dyspnea, unspecified: Secondary | ICD-10-CM

## 2014-07-01 DIAGNOSIS — R0602 Shortness of breath: Secondary | ICD-10-CM | POA: Diagnosis not present

## 2014-07-01 LAB — TSH: TSH: 1.38 u[IU]/mL (ref 0.35–4.50)

## 2014-07-01 LAB — CBC
HCT: 42.5 % (ref 36.0–46.0)
HEMOGLOBIN: 14.2 g/dL (ref 12.0–15.0)
MCHC: 33.5 g/dL (ref 30.0–36.0)
MCV: 90.1 fl (ref 78.0–100.0)
Platelets: 280 10*3/uL (ref 150.0–400.0)
RBC: 4.71 Mil/uL (ref 3.87–5.11)
RDW: 14.2 % (ref 11.5–15.5)
WBC: 13.1 10*3/uL — ABNORMAL HIGH (ref 4.0–10.5)

## 2014-07-01 LAB — BASIC METABOLIC PANEL
BUN: 12 mg/dL (ref 6–23)
CO2: 27 mEq/L (ref 19–32)
CREATININE: 0.72 mg/dL (ref 0.40–1.20)
Calcium: 9.5 mg/dL (ref 8.4–10.5)
Chloride: 102 mEq/L (ref 96–112)
GFR: 82.21 mL/min (ref >60.00)
Glucose, Bld: 160 mg/dL — ABNORMAL HIGH (ref 70–99)
Potassium: 3.4 mEq/L — ABNORMAL LOW (ref 3.5–5.1)
Sodium: 138 mEq/L (ref 135–145)

## 2014-07-01 LAB — BRAIN NATRIURETIC PEPTIDE: Pro B Natriuretic peptide (BNP): 124 pg/mL — ABNORMAL HIGH (ref 0.0–100.0)

## 2014-07-01 NOTE — Progress Notes (Signed)
Cardiology Office Note   Date:  07/01/2014   ID:  [name], DOB March 14, 1930, MRN [id number]  PCP:  Sally Ou, MD  Cardiologist:   Sally Carnes, MD   No chief complaint on file.     History of Present Illness: [name] is a 81 y.o. female who is referred for SOB   Followed  In [place] clinic  Seen in Feb.  Hx of SOB  Seen by Dr Melvyn Novas. Began Feb  Occurs daily  Getting worse.    Spells worse  with activity or laying flat Tried inhalers with mild relief.  Does have a history of allergies and asthma.    No CP No PND  Some swelling in ankles       Current Outpatient Prescriptions  Medication Sig Dispense Refill  . albuterol (PROAIR HFA) 108 (90 BASE) MCG/ACT inhaler 2 puffs up to every 4 hours if can't catch your breath 1 Inhaler 11  . amLODipine-olmesartan (AZOR) 10-40 MG per tablet Take 1 tablet by mouth at bedtime.    Marland Kitchen aspirin 81 MG tablet Take 81 mg by mouth daily.      Marland Kitchen azelastine (ASTELIN) 137 MCG/SPRAY nasal spray Place 2 sprays into both nostrils at bedtime as needed. Use in each nostril as directed    . celecoxib (CELEBREX) 100 MG capsule Take 100 mg by mouth daily as needed.    . chlorpheniramine (CHLOR-TRIMETON) 4 MG tablet 2 tabs by mouth at bedtime.  May add 1 every 4 hours as needed for drippy nose, drainage, throat clearing.    Marland Kitchen dextromethorphan (DELSYM) 30 MG/5ML liquid 2 tsp twice daily as needed for cough    . diazepam (VALIUM) 5 MG tablet Take 1/2 tablet by mouth twice daily as needed for back spasms     . esomeprazole (NEXIUM) 40 MG capsule Take 40 mg by mouth daily.     . famotidine (PEPCID) 20 MG tablet 1 tablet at bedtime    . ferrous sulfate 325 (65 FE) MG tablet Take 1 tablet (325 mg total) by mouth 3 (three) times daily with meals. 90 tablet 5  . FLUoxetine (PROZAC) 20 MG capsule Take 20 mg by mouth daily.    Marland Kitchen oxycodone (OXY-IR) 5 MG capsule Take 1/2-1 tablet by mouth every 8 hours as needed for severe back pain    . PREDNISOLONE PO Take 30 mg  by mouth daily.     No current facility-administered medications for this visit.    Allergies:   Sulfonamide derivatives   Past Medical History  Diagnosis Date  . Anxiety disorder   . Arthritis   . Asthma   . Hx of adenomatous colonic polyps   . Fibromyalgia   . Hyperlipidemia   . Hypertension   . IBS (irritable bowel syndrome)   . Peptic ulcer disease   . Fatty liver   . Abnormal LFTs (liver function tests)   . Diverticulosis   . Depression   . Abdominal bloating   . Skin cancer     shoulder, leg, right eye    Past Surgical History  Procedure Laterality Date  . Abdominal hysterectomy    . Thyroidectomy    . Nasal septum surgery    . Eye surgery Right 2006  . Back surgery  2005    lower back  . Right shoulder skin cancer excision  2012  . Right leg skin cancer surgery  2014  . Right knee surgery  2006    arthroscopy  Social History:  The patient  reports that she has never smoked. She has never used smokeless tobacco. She reports that she does not drink alcohol or use illicit drugs.   Family History:  The patient's family history includes Arthritis in an other family member; Colon polyps in her maternal aunt; Diabetes in her father; Heart disease in her father and mother; Kidney disease in an other family member; Lung cancer in her brother and sister; Lung disease in her mother. There is no history of Colon cancer.    ROS:  Please see the history of present illness. All other systems are reviewed and  Negative to the above problem except as noted.    PHYSICAL EXAM: VS:  BP 138/64 mmHg  Pulse 82  Ht 5\' 3"  (1.6 m)  Wt 203 lb (92.08 kg)  BMI 35.97 kg/m2  GEN: Well nourished, well developed, in no acute distress HEENT: normal Neck: no JVD, carotid bruits, or masses Cardiac: RRR; no murmurs, rubs, or gallops,no edema  Respiratory:  clear to auscultation bilaterally, normal work of breathing GI: soft, nontender, nondistended, + BS  No hepatomegaly  MS: no  deformity Moving all extremities   Skin: warm and dry, no rash Neuro:  Strength and sensation are intact Psych: euthymic mood, full affect   EKG:  EKG is ordered today.SR 82 bpm  Nonspecific ST T wave changes     Lipid Panel No results found for: CHOL, TRIG, HDL, CHOLHDL, VLDL, LDLCALC, LDLDIRECT    Wt Readings from Last 3 Encounters:  07/01/14 203 lb (92.08 kg)  02/22/14 198 lb (89.812 kg)  01/18/14 199 lb (90.266 kg)      ASSESSMENT AND PLAN:  1.  Dypsnea.  Patient on prednisone.  No wheezes.   Concerning   Volume status US not bad.  Will check labs  Set up for echo      Current medicines are reviewed at length with the patient today.  The patient does not have concerns regarding medicines.   Disposition:   FU will be based on test results    Signed, Sally Carnes, MD  07/01/2014 12:21 PM    [facility] [place], [place], [place]  [address] Phone: [phone]; Fax: [phone]

## 2014-07-01 NOTE — Patient Instructions (Signed)
Medication Instructions:  Your physician recommends that you continue on your current medications as directed. Please refer to the Current Medication list given to you today.   Labwork: Your physician recommends that you return for lab work today (CBC, BMET, BNP, TSH)   Testing/Procedures: Your physician has requested that you have an echocardiogram. Echocardiography is a painless test that uses sound waves to create images of your heart. It provides your doctor with information about the size and shape of your heart and how well your heart's chambers and valves are working. This procedure takes approximately one hour. There are no restrictions for this procedure.    Follow-Up: Follow up with your physician will depend on test results.   Any Other Special Instructions Will Be Listed Below (If Applicable).

## 2014-07-05 ENCOUNTER — Ambulatory Visit (HOSPITAL_COMMUNITY): Payer: Medicare Other | Attending: Cardiology | Admitting: Radiology

## 2014-07-05 DIAGNOSIS — R0602 Shortness of breath: Secondary | ICD-10-CM | POA: Insufficient documentation

## 2014-07-05 DIAGNOSIS — R0600 Dyspnea, unspecified: Secondary | ICD-10-CM | POA: Diagnosis present

## 2014-07-05 NOTE — Progress Notes (Signed)
Echocardiogram performed.  

## 2014-07-06 DIAGNOSIS — M316 Other giant cell arteritis: Secondary | ICD-10-CM | POA: Diagnosis not present

## 2014-07-06 DIAGNOSIS — Z79899 Other long term (current) drug therapy: Secondary | ICD-10-CM | POA: Diagnosis not present

## 2014-07-07 ENCOUNTER — Other Ambulatory Visit: Payer: Self-pay | Admitting: Internal Medicine

## 2014-07-19 ENCOUNTER — Encounter: Payer: Self-pay | Admitting: Internal Medicine

## 2014-07-21 ENCOUNTER — Telehealth: Payer: Self-pay | Admitting: *Deleted

## 2014-07-21 DIAGNOSIS — R0602 Shortness of breath: Secondary | ICD-10-CM

## 2014-07-21 NOTE — Telephone Encounter (Signed)
Message     Echo shows normal pumping function of the heart.     With her SOB I would recomm a dobutamine myoview       Spoke with patient's daughter, [name].  Informed. Advised someone will call to schedule. Reviewed instructions for myoview.

## 2014-07-26 ENCOUNTER — Other Ambulatory Visit: Payer: Self-pay | Admitting: *Deleted

## 2014-08-04 NOTE — Telephone Encounter (Signed)
myoview is scheduled for 5/26.

## 2014-08-10 ENCOUNTER — Telehealth (HOSPITAL_COMMUNITY): Payer: Self-pay

## 2014-08-10 NOTE — Telephone Encounter (Signed)
Patient's daughter, Gara Kroner, per [facility] given detailed instructions per Myocardial Perfusion Study Information Sheet for test on 08-11-2014 at 9:00am. Patient's daughter verbalized understanding. Oletta Lamas, Radek Carnero A

## 2014-08-11 ENCOUNTER — Ambulatory Visit (HOSPITAL_COMMUNITY): Payer: Medicare Other | Attending: Internal Medicine

## 2014-08-11 DIAGNOSIS — R0602 Shortness of breath: Secondary | ICD-10-CM | POA: Diagnosis not present

## 2014-08-11 LAB — MYOCARDIAL PERFUSION IMAGING
CHL CUP NUCLEAR SDS: 2
CHL CUP NUCLEAR SSS: 4
CHL CUP RESTING HR STRESS: 75 {beats}/min
LHR: 0.34
LVDIAVOL: 77 mL
LVSYSVOL: 25 mL
Nuc Stress EF: 68 %
Peak HR: 96 {beats}/min
SRS: 2
TID: 1.08

## 2014-08-11 MED ORDER — REGADENOSON 0.4 MG/5ML IV SOLN
0.4000 mg | Freq: Once | INTRAVENOUS | Status: AC
Start: 2014-08-11 — End: 2014-08-11
  Administered 2014-08-11: 0.4 mg via INTRAVENOUS

## 2014-08-11 MED ORDER — TECHNETIUM TC 99M SESTAMIBI GENERIC - CARDIOLITE
33.0000 | Freq: Once | INTRAVENOUS | Status: AC | PRN
  Administered 2014-08-11: 33 via INTRAVENOUS

## 2014-08-11 MED ORDER — TECHNETIUM TC 99M SESTAMIBI GENERIC - CARDIOLITE
11.0000 | Freq: Once | INTRAVENOUS | Status: AC | PRN
  Administered 2014-08-11: 11 via INTRAVENOUS

## 2014-08-19 ENCOUNTER — Telehealth: Payer: Self-pay | Admitting: Nurse Practitioner

## 2014-08-19 NOTE — Telephone Encounter (Signed)
Called to report myoview results with patient. Phone number given is daughter, Gara Kroner, who is patient's caregiver.  Daughter is concerned that we do not have dpr on file because she said patient completed it when she saw Dr. Harrington Challenger in April.  I reported myoview results to daughter after receiving 2 patient identifiers.  Daughter verbalized understanding; she would like to know the treatment plan from this point forward.  Dr. Harrington Challenger' last office visit states follow-up will be based on test results but no time frame given.  Daughter states patient continues to c/o SOB with limited activity.  I discussed patient's diet with daughter and she admits patient eats packaged and prepared foods; states she doesn't eat a lot of anything.  I advised her that patient should avoid high sodium foods and that she may be deconditioned due to inability to exercise.  I advised that heart pumping function is normal.  I scheduled patient for follow-up on August 5 and am routing to Dr. Harrington Challenger for advice.

## 2014-08-23 NOTE — Telephone Encounter (Signed)
See if pt can be added on sooner to a day I am in clinic

## 2014-08-24 NOTE — Telephone Encounter (Signed)
Scheduled patient on 09/08/14 1:45 pm. Her daughter [name] is aware of this time/date.

## 2014-09-08 ENCOUNTER — Ambulatory Visit (INDEPENDENT_AMBULATORY_CARE_PROVIDER_SITE_OTHER): Payer: Medicare Other | Admitting: Internal Medicine

## 2014-09-08 ENCOUNTER — Encounter: Payer: Self-pay | Admitting: Internal Medicine

## 2014-09-08 VITALS — BP 134/62 | HR 83 | Ht 63.0 in | Wt 202.8 lb

## 2014-09-08 DIAGNOSIS — D464 Refractory anemia, unspecified: Secondary | ICD-10-CM

## 2014-09-08 DIAGNOSIS — Z79899 Other long term (current) drug therapy: Secondary | ICD-10-CM | POA: Diagnosis not present

## 2014-09-08 DIAGNOSIS — I00 Rheumatic fever without heart involvement: Secondary | ICD-10-CM

## 2014-09-08 DIAGNOSIS — M810 Age-related osteoporosis without current pathological fracture: Secondary | ICD-10-CM | POA: Diagnosis not present

## 2014-09-08 DIAGNOSIS — E785 Hyperlipidemia, unspecified: Secondary | ICD-10-CM | POA: Diagnosis not present

## 2014-09-08 DIAGNOSIS — M316 Other giant cell arteritis: Secondary | ICD-10-CM | POA: Diagnosis not present

## 2014-09-08 DIAGNOSIS — M0520 Rheumatoid vasculitis with rheumatoid arthritis of unspecified site: Secondary | ICD-10-CM

## 2014-09-08 DIAGNOSIS — K219 Gastro-esophageal reflux disease without esophagitis: Secondary | ICD-10-CM | POA: Diagnosis not present

## 2014-09-08 LAB — LDL CHOLESTEROL, DIRECT: Direct LDL: 156 mg/dL

## 2014-09-08 LAB — LIPID PANEL
Cholesterol: 267 mg/dL — ABNORMAL HIGH (ref 0–200)
HDL: 47.1 mg/dL (ref >39.00)
NONHDL: 219.9
Total CHOL/HDL Ratio: 6
Triglycerides: 376 mg/dL — ABNORMAL HIGH (ref 0.0–149.0)
VLDL: 75.2 mg/dL — ABNORMAL HIGH (ref 0.0–40.0)

## 2014-09-08 NOTE — Progress Notes (Signed)
Cardiology Office Note   Date:  09/08/2014   ID:  [name], DOB [birth year], MRN [id number]  PCP:  Reita Cliche, MD  Cardiologist:   Dorris Carnes, MD   No chief complaint on file.     History of Present Illness: [name] is a 81 y.o. female with a history of of SOB  I sw her in April   2016.  Echo showed normal LV systolic funciton  Mild diastolic dysfunction.  Myview done  Nromal   pts daughter called  Continued SOB  Limited activity.    She has been seen by Selinda Orion in the past  Dx mod asthma  Seen in Dec  Pt continues to wheeze   Also notes chronic drainage in back of throat  White  Current Outpatient Prescriptions  Medication Sig Dispense Refill  . albuterol (PROAIR HFA) 108 (90 BASE) MCG/ACT inhaler 2 puffs up to every 4 hours if can't catch your breath 1 Inhaler 11  . alendronate (FOSAMAX) 70 MG tablet Take 70 mg by mouth once a week. Take with a full glass of water on an empty stomach.    Marland Kitchen amLODipine-olmesartan (AZOR) 10-40 MG per tablet Take 1 tablet by mouth at bedtime.    Marland Kitchen aspirin 81 MG tablet Take 81 mg by mouth daily.      Marland Kitchen azelastine (ASTELIN) 137 MCG/SPRAY nasal spray Place 2 sprays into both nostrils at bedtime as needed. Use in each nostril as directed    . chlorpheniramine (CHLOR-TRIMETON) 4 MG tablet 2 tabs by mouth at bedtime.  May add 1 every 4 hours as needed for drippy nose, drainage, throat clearing.    Marland Kitchen dextromethorphan (DELSYM) 30 MG/5ML liquid 2 tsp twice daily as needed for cough    . diazepam (VALIUM) 5 MG tablet Take 1/2 tablet by mouth twice daily as needed for back spasms     . esomeprazole (NEXIUM) 40 MG capsule Take 40 mg by mouth daily.     . famotidine (PEPCID) 20 MG tablet Take 20 mg by mouth at bedtime.    . ferrous sulfate 325 (65 FE) MG tablet Take 1 tablet (325 mg total) by mouth 3 (three) times daily with meals. 90 tablet 5  . FLUoxetine (PROZAC) 20 MG capsule Take 20 mg by mouth daily.    . folic acid (FOLVITE) 1 MG tablet  Take 1 mg by mouth daily.    . methotrexate (RHEUMATREX) 2.5 MG tablet Take 10 mg by mouth once a week. Caution:Chemotherapy. Protect from light.    Marland Kitchen oxycodone (OXY-IR) 5 MG capsule Take 1/2-1 tablet by mouth every 8 hours as needed for severe back pain    . PREDNISOLONE PO Take 1 tablet by mouth daily. Take as directed until completed in September     No current facility-administered medications for this visit.    Allergies:   Sulfonamide derivatives   Past Medical History  Diagnosis Date  . Anxiety disorder   . Arthritis   . Asthma   . Hx of adenomatous colonic polyps   . Fibromyalgia   . Hyperlipidemia   . Hypertension   . IBS (irritable bowel syndrome)   . Peptic ulcer disease   . Fatty liver   . Abnormal LFTs (liver function tests)   . Diverticulosis   . Depression   . Abdominal bloating   . Skin cancer     shoulder, leg, right eye    Past Surgical History  Procedure Laterality Date  . Abdominal hysterectomy    .  Thyroidectomy    . Nasal septum surgery    . Eye surgery Right 2006  . Back surgery  2005    lower back  . Right shoulder skin cancer excision  2012  . Right leg skin cancer surgery  2014  . Right knee surgery  2006    arthroscopy     Social History:  The patient  reports that she has never smoked. She has never used smokeless tobacco. She reports that she does not drink alcohol or use illicit drugs.   Family History:  The patient's family history includes Arthritis in an other family member; COPD in her sister; Colon polyps in her maternal aunt; Diabetes in her father; Heart disease in her father and mother; Kidney disease in an other family member; Lung cancer in her brother and sister; Lung disease in her mother. There is no history of Colon cancer.    ROS:  Please see the history of present illness. All other systems are reviewed and  Negative to the above problem except as noted.    PHYSICAL EXAM: VS:  BP 134/62 mmHg  Pulse 83  Ht 5\' 3"  (1.6  m)  Wt 202 lb 12.8 oz (91.989 kg)  BMI 35.93 kg/m2  SpO2 95%  GEN: Morbidly obese 81 yo  in no acute distress HEENT: normal Neck: no JVD, carotid bruits, or masses Cardiac: RRR; no murmurs, rubs, or gallops,no edema  Respiratory:  Upper airway wheeze GI: soft, nontender, nondistended, + BS  No hepatomegaly  MS: no deformity Moving all extremities   Skin: warm and dry, no rash Neuro:  Strength and sensation are intact Psych: euthymic mood, full affect   EKG:  EKG is not ordered today.   Lipid Panel No results found for: CHOL, TRIG, HDL, CHOLHDL, VLDL, LDLCALC, LDLDIRECT    Wt Readings from Last 3 Encounters:  09/08/14 202 lb 12.8 oz (91.989 kg)  08/11/14 203 lb (92.08 kg)  07/01/14 203 lb (92.08 kg)      ASSESSMENT AND PLAN:  1  SOB  Pt has had myoview which is normal    I am not convinced cardiac Will forward to [facility]  2.  Atherosclerosis  Pt with plaquing on aorta.  Will get lipids        Signed, Dorris Carnes, MD  09/08/2014 2:13 PM    [facility] [place], [place], [place]  [address] Phone: [phone]; Fax: [phone]

## 2014-09-08 NOTE — Patient Instructions (Signed)
Your physician recommends that you continue on your current medications as directed. Please refer to the Current Medication list given to you today. Your physician recommends that you return for lab work in: today ([place])

## 2014-09-09 ENCOUNTER — Encounter: Payer: Self-pay | Admitting: Internal Medicine

## 2014-09-15 ENCOUNTER — Other Ambulatory Visit: Payer: Self-pay | Admitting: *Deleted

## 2014-09-15 DIAGNOSIS — E785 Hyperlipidemia, unspecified: Secondary | ICD-10-CM

## 2014-09-15 DIAGNOSIS — E781 Pure hyperglyceridemia: Secondary | ICD-10-CM

## 2014-09-15 MED ORDER — ATORVASTATIN CALCIUM 20 MG PO TABS: 20.0000 mg | ORAL_TABLET | Freq: Every day | ORAL | Status: DC

## 2014-09-27 DIAGNOSIS — M62830 Muscle spasm of back: Secondary | ICD-10-CM | POA: Diagnosis not present

## 2014-09-27 DIAGNOSIS — M4806 Spinal stenosis, lumbar region: Secondary | ICD-10-CM | POA: Diagnosis not present

## 2014-09-27 DIAGNOSIS — G8929 Other chronic pain: Secondary | ICD-10-CM | POA: Diagnosis not present

## 2014-09-27 DIAGNOSIS — M545 Low back pain: Secondary | ICD-10-CM | POA: Diagnosis not present

## 2014-09-27 DIAGNOSIS — M549 Dorsalgia, unspecified: Secondary | ICD-10-CM | POA: Diagnosis not present

## 2014-09-27 DIAGNOSIS — M353 Polymyalgia rheumatica: Secondary | ICD-10-CM | POA: Diagnosis not present

## 2014-10-21 ENCOUNTER — Ambulatory Visit: Payer: Medicare Other | Admitting: Internal Medicine

## 2014-11-01 ENCOUNTER — Telehealth: Payer: Self-pay | Admitting: Internal Medicine

## 2014-11-01 NOTE — Telephone Encounter (Signed)
New Message  Pt daughter calling to speak w/ Rn concerning pt having problems w/ Lipitor Rx. Pt daughter wanted to speak w/ RN. Please call back and discuss.

## 2014-11-01 NOTE — Telephone Encounter (Signed)
Spoke with pt's daughter who reports pt has been having problems since starting lipitor at the end of June.  Has off and on diarrhea and nausea. Doesn't feel well when she takes lipitor. Reports cramps and muscle aches in legs. Daughter reports she thinks pt has had trouble taking Lipitor in the past.  I told daughter pt could stop Lipitor to see if symptoms improved and I would send message to Dr. Harrington Challenger for further recommendations.  Lab work scheduled for November 15, 2014 cancelled.

## 2014-11-07 NOTE — Telephone Encounter (Signed)
Pt needs to call back with response in a few wks.

## 2014-11-07 NOTE — Telephone Encounter (Signed)
Spoke with patient's daughter and informed her of Dr. Alan Ripper recommendation and patient will call in a few weeks with response to stopping Lipitor and see if symptoms have subsided.

## 2014-11-10 DIAGNOSIS — C44519 Basal cell carcinoma of skin of other part of trunk: Secondary | ICD-10-CM | POA: Diagnosis not present

## 2014-11-10 DIAGNOSIS — L218 Other seborrheic dermatitis: Secondary | ICD-10-CM | POA: Diagnosis not present

## 2014-11-10 DIAGNOSIS — D485 Neoplasm of uncertain behavior of skin: Secondary | ICD-10-CM | POA: Diagnosis not present

## 2014-11-10 DIAGNOSIS — L821 Other seborrheic keratosis: Secondary | ICD-10-CM | POA: Diagnosis not present

## 2014-11-10 DIAGNOSIS — L570 Actinic keratosis: Secondary | ICD-10-CM | POA: Diagnosis not present

## 2014-11-10 DIAGNOSIS — D1801 Hemangioma of skin and subcutaneous tissue: Secondary | ICD-10-CM | POA: Diagnosis not present

## 2014-11-15 ENCOUNTER — Other Ambulatory Visit: Payer: Medicare Other

## 2014-12-06 ENCOUNTER — Encounter: Payer: Self-pay | Admitting: Internal Medicine

## 2014-12-09 DIAGNOSIS — C44519 Basal cell carcinoma of skin of other part of trunk: Secondary | ICD-10-CM | POA: Diagnosis not present

## 2014-12-13 DIAGNOSIS — R5383 Other fatigue: Secondary | ICD-10-CM | POA: Diagnosis not present

## 2014-12-13 DIAGNOSIS — M1990 Unspecified osteoarthritis, unspecified site: Secondary | ICD-10-CM | POA: Diagnosis not present

## 2014-12-13 DIAGNOSIS — M316 Other giant cell arteritis: Secondary | ICD-10-CM | POA: Diagnosis not present

## 2014-12-13 DIAGNOSIS — Z79899 Other long term (current) drug therapy: Secondary | ICD-10-CM | POA: Diagnosis not present

## 2015-03-16 ENCOUNTER — Ambulatory Visit (INDEPENDENT_AMBULATORY_CARE_PROVIDER_SITE_OTHER): Payer: Medicare Other | Admitting: Family Medicine

## 2015-03-16 ENCOUNTER — Encounter: Payer: Self-pay | Admitting: Family Medicine

## 2015-03-16 VITALS — BP 156/81 | HR 74 | Temp 98.0°F | Resp 20 | Wt 194.8 lb

## 2015-03-16 DIAGNOSIS — J209 Acute bronchitis, unspecified: Secondary | ICD-10-CM | POA: Diagnosis not present

## 2015-03-16 DIAGNOSIS — Z7189 Other specified counseling: Secondary | ICD-10-CM

## 2015-03-16 DIAGNOSIS — F1190 Opioid use, unspecified, uncomplicated: Secondary | ICD-10-CM | POA: Insufficient documentation

## 2015-03-16 DIAGNOSIS — Z7689 Persons encountering health services in other specified circumstances: Secondary | ICD-10-CM

## 2015-03-16 DIAGNOSIS — R062 Wheezing: Secondary | ICD-10-CM

## 2015-03-16 DIAGNOSIS — M353 Polymyalgia rheumatica: Secondary | ICD-10-CM | POA: Insufficient documentation

## 2015-03-16 DIAGNOSIS — M62830 Muscle spasm of back: Secondary | ICD-10-CM | POA: Insufficient documentation

## 2015-03-16 MED ORDER — IPRATROPIUM-ALBUTEROL 0.5-2.5 (3) MG/3ML IN SOLN
3.0000 mL | Freq: Once | RESPIRATORY_TRACT | Status: AC
  Administered 2015-03-16: 3 mL via RESPIRATORY_TRACT

## 2015-03-16 MED ORDER — DOXYCYCLINE HYCLATE 100 MG PO TABS: 100.0000 mg | ORAL_TABLET | Freq: Two times a day (BID) | ORAL | Status: DC

## 2015-03-16 NOTE — Progress Notes (Signed)
Subjective:    Patient ID: [name], female    DOB: 08-12-1929, 81 y.o.   MRN: [id number]  HPI  Patient presents for new patient establishment with complaints of cough. All past medical history, surgical history, allergies, family history, immunizations and social history was obtained from the patient today and entered into the electronic medical record. Records are requested from her prior PCP, and will be reviewed at the time they are received. All medical records will be updated at that time.  Cough: pt present for new pt visit with a 1 week ago history Cough, ear pressure, eye pressure and headache. She is waking at night with cough. She reports a thick phlegm production that has progressed from clear to thick white. She denies fever or chills,vomit, diarrhea or rash. She endorses occasional nausea, but state she has nausea from some of her meds.  She has started allergy medication and aching went away. She is UTD with her Flu shot. She has a  History of asthma and uses albuterol every couple of hours, feeling wheeze and unable to catch her breath. She Took Cough medicine, which did not help much. She is Eating and drinking ok.   Past Medical History  Diagnosis Date  . Anxiety disorder   . Arthritis   . Asthma   . Hx of adenomatous colonic polyps   . Hyperlipidemia   . Hypertension   . IBS (irritable bowel syndrome)   . Peptic ulcer disease   . Fatty liver   . Abnormal LFTs (liver function tests)   . Diverticulosis   . Depression   . Abdominal bloating   . GERD (gastroesophageal reflux disease)   . Edema   . Fibromyalgia   . Thoracic radiculitis   . Lumbar radiculitis   . Skin cancer     shoulder, leg, right eye (? squamous by resection)  . Thyroid nodule     "removed"  . Urinary incontinence    Allergies  Allergen Reactions  . Sulfonamide Derivatives Rash   Past Surgical History  Procedure Laterality Date  . Abdominal hysterectomy    . Thyroidectomy    . Nasal  septum surgery    . Eye surgery Right 2006  . Back surgery  2005    lower back  . Right shoulder skin cancer excision  2012  . Right leg skin cancer surgery  2014  . Right knee surgery  2006    arthroscopy  . Liver biopsy      fatty liver   Family History  Problem Relation Age of Onset  . Colon polyps Maternal Aunt   . Diabetes Father   . Heart disease Father   . Heart disease Mother   . Lung disease Mother   . COPD Mother   . Colon cancer Neg Hx   . Kidney disease      niece-mat side  . Arthritis    . Lung cancer Brother   . Cancer Brother     lung  . COPD Brother   . Stroke Brother   . Lung cancer Sister   . COPD Sister   . Diabetes Son    Social History   Social History  . Marital Status: Married    Spouse Name: N/A  . Number of Children: 3  . Years of Education: N/A   Occupational History  . Retired Water engineer    Social History Main Topics  . Smoking status: Never Smoker   . Smokeless tobacco: Never  Used  . Alcohol Use: No  . Drug Use: No  . Sexual Activity: No   Other Topics Concern  . Not on file   Social History Narrative   Married, husband Armed forces training and education officer.    Lives with [name] and [name].   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   Patient takes a daily vitamin   She wears her seat belt. Exercises greater than 3 times a week.   Wears 2 hearing aids. No dentures.   "Sometimes "requires assistive device for walking, either a walker or wheelchair.   There is a smoke detector in her home. There are firearms in her home. She feels safe in her relationships.    Review of Systems Negative, with the exception of above mentioned in HPI     Objective:   Physical Exam BP 156/81 mmHg  Pulse 74  Temp(Src) 98 F (36.7 C)  Resp 20  Wt 194 lb 12 oz (88.338 kg)  SpO2 94%  Body mass index is 34.51 kg/(m^2). Gen: Afebrile. No acute distress. Nontoxic in appearance . Well developed, well nourished, pleasant female. HOH. Obese. HENT: AT. [place].  Bilateral TM visualized and normal in appearance. MMM, no oral lesions. Bilateral nares with erythema and swelling.  Throat without erythema or exudates. Cough and hoarseness present on exam. Not TTP facial sinus.  Eyes:Pupils Equal Round Reactive to light, Extraocular movements intact,  Conjunctiva without redness, discharge or icterus. Neck/lymp/endocrine: Supple, mild cervical ant lymphadenopathy, no masses, trachea midline. No thyromegaly. CV: RRR , no murmur appreciated Chest: Bibasilar wheezing, no crackles or rhonchi. Good air movement. Normal resp effort.  Abd: Soft. NTND. BS present Skin: no rashes, purpura or petechiae.  Neuro: Normal gait. PERLA. EOMi. Alert. Oriented x3      Assessment & Plan:  [name] is a 81 y.o. female presents for new patient establishment with acute illness of bronchitis with wheezing.  1. Wheezing - Lungs improved, wheezing resolved with DuoNeb treatment in office today. - ipratropium-albuterol (DUONEB) 0.5-2.5 (3) MG/3ML nebulizer solution 3 mL; Take 3 mLs by nebulization once.  2. Acute bronchitis, unspecified organism - treat bronchitis with doxycycline, rest, hydrate, - doxycycline (VIBRA-TABS) 100 MG tablet; Take 1 tablet (100 mg total) by mouth 2 (two) times daily.  Dispense: 20 tablet; Refill: 0  Follow-up with in one week if no improvement, otherwise follow up in 2 months for chronic medical issues.

## 2015-03-16 NOTE — Patient Instructions (Signed)
I have called in antibiotic for you to take twice a day for ten days.  Hydrate, rest, use mucinex, humidifier and OTC cough drops.  F/U end of January for chronic medical problems.

## 2015-03-28 ENCOUNTER — Encounter: Payer: Self-pay | Admitting: Family Medicine

## 2015-03-28 DIAGNOSIS — Z79631 Long term (current) use of antimetabolite agent: Secondary | ICD-10-CM | POA: Insufficient documentation

## 2015-03-28 DIAGNOSIS — F1190 Opioid use, unspecified, uncomplicated: Secondary | ICD-10-CM | POA: Insufficient documentation

## 2015-03-28 DIAGNOSIS — Z79899 Other long term (current) drug therapy: Secondary | ICD-10-CM | POA: Insufficient documentation

## 2015-03-28 DIAGNOSIS — M316 Other giant cell arteritis: Secondary | ICD-10-CM | POA: Insufficient documentation

## 2015-03-29 ENCOUNTER — Telehealth: Payer: Self-pay | Admitting: Family Medicine

## 2015-03-29 NOTE — Telephone Encounter (Signed)
Spoke with patient scheduled an appt to be seen. Last OV note stated to follow up if no improvement.

## 2015-03-29 NOTE — Telephone Encounter (Signed)
Patient is still "stopped up" wants to know if she she should get another Rx for antibiotics. Her R ear felt stopped up too.

## 2015-03-31 ENCOUNTER — Encounter: Payer: Self-pay | Admitting: Family Medicine

## 2015-03-31 ENCOUNTER — Ambulatory Visit (INDEPENDENT_AMBULATORY_CARE_PROVIDER_SITE_OTHER): Payer: Medicare Other | Admitting: Family Medicine

## 2015-03-31 VITALS — BP 145/72 | HR 84 | Temp 98.0°F | Resp 20 | Wt 193.8 lb

## 2015-03-31 DIAGNOSIS — J0100 Acute maxillary sinusitis, unspecified: Secondary | ICD-10-CM

## 2015-03-31 MED ORDER — FLUTICASONE PROPIONATE 50 MCG/ACT NA SUSP: 2.0000 | Freq: Every day | NASAL | Status: DC

## 2015-03-31 MED ORDER — AMOXICILLIN-POT CLAVULANATE 875-125 MG PO TABS: 1.0000 | ORAL_TABLET | Freq: Two times a day (BID) | ORAL | Status: DC

## 2015-03-31 MED ORDER — MAGIC MOUTHWASH: 5.0000 mL | Freq: Four times a day (QID) | ORAL | Status: DC

## 2015-03-31 NOTE — Patient Instructions (Signed)
Use allegra daily, flonase (1 spray) each side daily for 2 weeks.  I have called in an antibiotic for you to cover the sinus infection.  Have your pharmacy . Continue nasal saline three times a day and use of humidifier.

## 2015-03-31 NOTE — Addendum Note (Signed)
Addended by: Leota Jacobsen on: 03/31/2015 01:16 PM   Modules accepted: Orders

## 2015-03-31 NOTE — Progress Notes (Signed)
Patient ID: [name], female   DOB: 04/07/1929, 82 y.o.   MRN: [id number]   Subjective:    Patient ID: [name], female    DOB: [birth year], 82 y.o.   MRN: [id number]  Sinusitis   Sinus pressure: Patient presents with sinus pressure and PND after treatment of bronchitis 2 weeks ago. She states her throat, bronchitis and  Breathing have greatly improved. Pt is not takinga daily antihistamine or nasal spray. She is using saline nasal spray and a humidifier daily. She is tolerating PO. No fever, diarrha or chills. She states the pressure around both of her eyes continue to become increasingly painful.    Past Medical History  Diagnosis Date  . Anxiety disorder   . Arthritis   . Asthma   . Hx of adenomatous colonic polyps   . Hyperlipidemia   . Hypertension   . IBS (irritable bowel syndrome)   . Peptic ulcer disease   . Fatty liver   . Abnormal LFTs (liver function tests)   . Diverticulosis   . Depression   . Abdominal bloating   . GERD (gastroesophageal reflux disease)   . Edema   . Fibromyalgia   . Thoracic radiculitis   . Lumbar radiculitis   . Skin cancer     shoulder, leg, right eye (? squamous by resection)  . Thyroid nodule     "removed"  . Urinary incontinence   . Giant cell arteritis (HCC)     No biopsy secondary to steroid use   Allergies  Allergen Reactions  . Sulfonamide Derivatives Rash   Past Surgical History  Procedure Laterality Date  . Abdominal hysterectomy    . Thyroidectomy    . Nasal septum surgery    . Eye surgery Right 2006  . Back surgery  2005    lower back  . Right shoulder skin cancer excision  2012  . Right leg skin cancer surgery  2014  . Right knee surgery  2006    arthroscopy  . Liver biopsy      fatty liver   Family History  Problem Relation Age of Onset  . Colon polyps Maternal Aunt   . Diabetes Father   . Heart disease Father   . Heart disease Mother   . Lung disease Mother   . COPD Mother   . Colon cancer Neg Hx     . Kidney disease      niece-mat side  . Arthritis    . Lung cancer Brother   . Cancer Brother     lung  . COPD Brother   . Stroke Brother   . Lung cancer Sister   . COPD Sister   . Diabetes Son    Social History   Social History  . Marital Status: Married    Spouse Name: N/A  . Number of Children: 3  . Years of Education: N/A   Occupational History  . Retired Water engineer    Social History Main Topics  . Smoking status: Never Smoker   . Smokeless tobacco: Never Used  . Alcohol Use: No  . Drug Use: No  . Sexual Activity: No   Other Topics Concern  . Not on file   Social History Narrative   Married, husband Armed forces training and education officer.    Lives with [name] and [name].   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   Patient takes a daily vitamin   She wears her seat belt. Exercises greater than 3 times a week.  Wears 2 hearing aids. No dentures.   "Sometimes "requires assistive device for walking, either a walker or wheelchair.   There is a smoke detector in her home. There are firearms in her home. She feels safe in her relationships.    Review of Systems Negative, with the exception of above mentioned in HPI     Objective:   Physical Exam BP 145/72 mmHg  Pulse 84  Temp(Src) 98 F (36.7 C)  Resp 20  Wt 193 lb 12 oz (87.884 kg)  SpO2 95%  Body mass index is 34.33 kg/(m^2). Gen: Afebrile. No acute distress. Nontoxic in appearance . Well developed, well nourished, pleasant female. HOH (bilateral hearing aids in place). Obese. HENT: AT. [place]. Bilateral TM visualized and normal in appearance. MMM, ? Left pharyngeal blister. Bilateral nares with erythema and mild swelling.  Throat without erythema or exudates. Cough and hoarseness not present on exam. Not TTP facial sinus.  Eyes:Pupils Equal Round Reactive to light, Extraocular movements intact,  Conjunctiva without redness, discharge or icterus. Neck/lymp/endocrine: Supple, mild cervical ant lymphadenopathy, and left  submental node CV: RRR , no murmur appreciated Chest:CTAB, no wheeze or crackles today. Good air movement. Normal resp effort.  Abd: Soft. NTND. BS present Skin: no rashes, purpura or petechiae.  Neuro: Normal gait. PERLA. EOMi. Alert. Oriented x3      Assessment & Plan:  [name] is a 82 y.o. female presents for sinusitis.  1. Acute maxillary sinusitis, recurrence not specified - Use allegra daily, flonase (1 spray) each side daily for 2 weeks.  - Continue nasal saline three times a day and use of humidifier.  - amoxicillin-clavulanate (AUGMENTIN) 875-125 MG tablet; Take 1 tablet by mouth 2 (two) times daily.  Dispense: 14 tablet; Refill: 0

## 2015-04-02 ENCOUNTER — Other Ambulatory Visit: Payer: Self-pay | Admitting: Internal Medicine

## 2015-04-11 ENCOUNTER — Ambulatory Visit (INDEPENDENT_AMBULATORY_CARE_PROVIDER_SITE_OTHER): Payer: Medicare Other | Admitting: Family Medicine

## 2015-04-11 ENCOUNTER — Telehealth: Payer: Self-pay | Admitting: Internal Medicine

## 2015-04-11 ENCOUNTER — Encounter: Payer: Self-pay | Admitting: Family Medicine

## 2015-04-11 VITALS — BP 145/72 | HR 82 | Temp 97.9°F | Resp 20 | Wt 196.8 lb

## 2015-04-11 DIAGNOSIS — F418 Other specified anxiety disorders: Secondary | ICD-10-CM | POA: Diagnosis not present

## 2015-04-11 DIAGNOSIS — I10 Essential (primary) hypertension: Secondary | ICD-10-CM | POA: Diagnosis not present

## 2015-04-11 DIAGNOSIS — Z23 Encounter for immunization: Secondary | ICD-10-CM | POA: Diagnosis not present

## 2015-04-11 DIAGNOSIS — Z0000 Encounter for general adult medical examination without abnormal findings: Secondary | ICD-10-CM

## 2015-04-11 DIAGNOSIS — M353 Polymyalgia rheumatica: Secondary | ICD-10-CM

## 2015-04-11 DIAGNOSIS — E2839 Other primary ovarian failure: Secondary | ICD-10-CM

## 2015-04-11 DIAGNOSIS — M8580 Other specified disorders of bone density and structure, unspecified site: Secondary | ICD-10-CM | POA: Insufficient documentation

## 2015-04-11 LAB — BASIC METABOLIC PANEL
BUN: 11 mg/dL (ref 6–23)
CALCIUM: 9 mg/dL (ref 8.4–10.5)
CO2: 26 mEq/L (ref 19–32)
CREATININE: 0.72 mg/dL (ref 0.40–1.20)
Chloride: 104 mEq/L (ref 96–112)
GFR: 82.05 mL/min (ref >60.00)
Glucose, Bld: 105 mg/dL — ABNORMAL HIGH (ref 70–99)
Potassium: 4 mEq/L (ref 3.5–5.1)
Sodium: 141 mEq/L (ref 135–145)

## 2015-04-11 NOTE — Telephone Encounter (Signed)
New Message  This message is to inform you that we have made 3 consecutive attempts to contact the patient since 02/16/2015. Pt daughter states that the appt is not needed at this time. Will remove the patient from our recall list at this time    Jarold Motto [facility]

## 2015-04-11 NOTE — Progress Notes (Signed)
Subjective:    Patient ID: [name], female    DOB: [birth year], 82 y.o.   MRN: [id number]  HPI  Patient presents for scheduled office visit on chronic medical issues. Her past medical history, allergies, surgeries, family history and social history has been updated today.  Hypertension: Patient is currently prescribed amlodipine/valsartan 10-40 milligrams daily. She reports compliance with his medication. She sometimes checks her blood pressure in the outpatient setting and states the top number is usually in the 130s. She Closely monitor the salt content in her diet. She takes a daily baby aspirin. She is unable to tolerate statin use. She denies any chest pain, increase shortness of breath, visual changes or lower extremity edema. Per electronic medical review, it appears patient had a stress test and a nuclear scan through Dr. Alan Ripper office in 3 days history of coronary artery disease and abnormal EKG. Risk factors include hypertension. That time she was having chest pain, dyspnea on exertion and shortness of breath. Study 08/11/2014 was with normal EKG, no ST segment deviation stress. No arrhythmias. She did report chest discomfort and shortness of breath during the stress test. 2-D echocardiogram without contrast 07/04/2014 with an ejection fraction of 55-60%. Crit 1 diastolic dysfunction. Mild aortic valve regurg. Trace mitral regurg and tricuspid regurg.  Polymyalgia rheumatica: Patient follows regularly every 3 months with Dr. Earnest Conroy. Patient is currently on methotrexate, folate, prednisone, oxycodone IR and Valium. Patient had his routine 3 months lab work completed at her rheumatology's office. Reports great improvement since starting on the above regimen.  Osteopenia: Patient states she's had a bone density scan in the past, she is uncertain the date but states is greater than 2 years. SHe is currently on Fosamax, this was started with methotrexate use.  Depression with Anxiety: Patient  states she takes Prozac 60 mg daily for her depression with anxiety. She states she is going through a pad. Right now and she is having increased anxiety. She states that her husband has dementia and is causing her to be more stressed. His Personality is starting to change and is becoming more forgetful. Patient does have good support system with her daughter. She is requesting a higher milligram of Prozac. Last GFR calculation greater than 1 year.  Health maintenance:  Colonoscopy: Not indicated 2/2 age Mammogram:Not indicated 2/2 age Cervical cancer screening:Not indicated 2/2 age Immunizations: flu shot UTD, Zostavax UTD, tdap administered today, PNA vac x2 completed >65 Infectious disease screening: Not indicated. Dexa: [place] requested, ordered today. Patient did have prior DEXA scans, unknown year, deftly greater than 2 years by patient's report.   Past Medical History  Diagnosis Date  . Anxiety disorder   . Arthritis   . Asthma   . Hx of adenomatous colonic polyps   . Hyperlipidemia   . Hypertension   . IBS (irritable bowel syndrome)   . Peptic ulcer disease   . Fatty liver   . Abnormal LFTs (liver function tests)   . Diverticulosis   . Depression   . Abdominal bloating   . GERD (gastroesophageal reflux disease)   . Edema   . Fibromyalgia   . Thoracic radiculitis   . Lumbar radiculitis   . Skin cancer     shoulder, leg, right eye (? squamous by resection)  . Thyroid nodule     "removed"  . Urinary incontinence   . Giant cell arteritis (HCC)     No biopsy secondary to steroid use   Allergies  Allergen Reactions  .  Atorvastatin Other (See Comments)    Severe headache  . Sulfonamide Derivatives Rash   Social History  Substance Use Topics  . Smoking status: Never Smoker   . Smokeless tobacco: Never Used  . Alcohol Use: No     Review of Systems Negative, with the exception of above mentioned in HPI     Objective:   Physical Exam BP 145/72 mmHg  Pulse 82   Temp(Src) 97.9 F (36.6 C)  Resp 20  Wt 196 lb 12 oz (89.245 kg)  SpO2 92% Gen: Afebrile. No acute distress. Nontoxic in appearance, well-developed, well-nourished, Caucasian female. HENT: AT. [place]. Bilateral TM visualized and normal in appearance. MMM. Bilateral nares without erythema or swelling. Throat without erythema or exudates.  Eyes:Pupils Equal Round Reactive to light, Extraocular movements intact,  Conjunctiva without redness, discharge or icterus. Neck/lymp/endocrine: Supple, no lymphadenopathy, no thyromegaly CV: RRR , 1+ bilateral lower extremity edema, diminished but equal posterior tibialis bilaterally. Chest: CTAB, no wheeze or crackles Abd: Soft. Obese. NTND. BS present. No Masses palpated.  Skin: No rashes, purpura or petechiae. Skin intact, dry, warm and well-perfused. Neuro: Unchanged gait. PERLA. EOMi. Alert. Oriented x3. Cranial nerves II through XII intact. Muscle strength 5/5 upper and lower extremity. DTRs equal bilaterally. Psych: Normal affect, dress and demeanor. Normal speech. Normal thought content and judgment..      Assessment & Plan:  [name] is a 82 y.o. female present for follow up on chronic medical conditions. HYPERTENSION, BENIGN - Discussed with patient ideal treatment would be the pressure below 140/90. Patient to continue to monitor her blood pressure home, if consistently above the above parameters she is to make an appointment to be seen sooner. - Continue low-salt diet. Elevate legs above heart when able. Patient declined compression stockings. - Increased activity as tolerable. - Consider fish oil supplementation if can tolerate; unable to tolerate statins. - Basic Metabolic Panel (BMET)  Osteopenia/estrogen deficiency - Patient with history of osteopenia, now on steroids and methotrexate, estrogen deficiency. Will obtain bone density scan. - DG Bone Density; Future  Health maintenance examination - Bone density ordered today - T dap  administered today  Polymyalgia rheumatica ([place]) - Patient to continue following with rheumatology - Patient receiving OxyIR, methotrexate, folic acid, Valium through rheumatology. If needed could take over prescribing narc and Valium.    Depression with anxiety: Patient has been stable on 60 mg of Prozac daily for a few years. She is desiring increased dose today. Discussed maximum dose of 80 mg his only ideal in people with normal kidney function. Obtained BMP today to evaluate kidney function, consider 80 mg Prozac versus adding another agent.  Follow-up 3 months on chronic medical issues, sooner if needed

## 2015-04-11 NOTE — Patient Instructions (Signed)
Pleasure to see you again today.  Physical at the end of April with fasting labs drawn a few days prior. You will need to make an appt for labs as well. Once I get the results to your lab today we will contact you to discuss anxiety management.  I have ordered your bone density

## 2015-04-12 ENCOUNTER — Telehealth: Payer: Self-pay | Admitting: Family Medicine

## 2015-04-12 MED ORDER — FLUOXETINE HCL 20 MG PO CAPS: 80.0000 mg | ORAL_CAPSULE | Freq: Every day | ORAL | Status: DC

## 2015-04-12 NOTE — Telephone Encounter (Signed)
spoke with patient daughter [name] reviewed lab results and instructions for prozac dosing. She states they have a scheduled follow up with Dr Raoul Pitch. [name] verbalized understanding of all instructions.

## 2015-04-12 NOTE — Telephone Encounter (Signed)
Please call pt: - Her kidney function is great. I have called in the higher dose Prozac we discussed yesterday. She can take an extra 20 mg pill she currently has to equal a total of 80 mg prozac. This is the max dose of this medication. If she has any side effects from higher dose she should return to the 60 mg dose and follow up immediately, and we will discuss different possible medications to help her.  - I will need to follow-up with her in 4 weeks regardless to medication change/increase.

## 2015-04-13 DIAGNOSIS — Z79899 Other long term (current) drug therapy: Secondary | ICD-10-CM | POA: Diagnosis not present

## 2015-04-13 DIAGNOSIS — M316 Other giant cell arteritis: Secondary | ICD-10-CM | POA: Diagnosis not present

## 2015-04-13 NOTE — Telephone Encounter (Signed)
Noted  

## 2015-06-05 ENCOUNTER — Encounter: Payer: Self-pay | Admitting: Family Medicine

## 2015-06-05 ENCOUNTER — Other Ambulatory Visit: Payer: Self-pay | Admitting: *Deleted

## 2015-06-05 ENCOUNTER — Ambulatory Visit (INDEPENDENT_AMBULATORY_CARE_PROVIDER_SITE_OTHER): Payer: Medicare Other | Admitting: Family Medicine

## 2015-06-05 VITALS — BP 139/78 | HR 71 | Temp 98.0°F | Resp 20 | Wt 187.2 lb

## 2015-06-05 DIAGNOSIS — F430 Acute stress reaction: Secondary | ICD-10-CM | POA: Diagnosis not present

## 2015-06-05 DIAGNOSIS — J0100 Acute maxillary sinusitis, unspecified: Secondary | ICD-10-CM | POA: Insufficient documentation

## 2015-06-05 MED ORDER — ALBUTEROL SULFATE HFA 108 (90 BASE) MCG/ACT IN AERS: INHALATION_SPRAY | RESPIRATORY_TRACT | Status: DC

## 2015-06-05 MED ORDER — AMOXICILLIN-POT CLAVULANATE 875-125 MG PO TABS: 1.0000 | ORAL_TABLET | Freq: Two times a day (BID) | ORAL | Status: DC

## 2015-06-05 NOTE — Patient Instructions (Addendum)
Augmenitn, flonase, mucinex, nasal saline, rest and hydrate.  Adjustment Disorder Adjustment disorder is an unusually severe reaction to a stressful life event, such as the loss of a job or physical illness. The event may be any stressful event other than the loss of a loved one. Adjustment disorder may affect your feelings, your thinking, how you act, or a combination of these. It may interfere with personal relationships or with the way you are at work, school, or home. People with this disorder are at risk for suicide and substance abuse. They may develop a more serious mental disorder, such as major depressive disorder or post-traumatic stress disorder. SIGNS AND SYMPTOMS  Symptoms may include:  Sadness, depressed mood, or crying spells.  Loss of enjoyment.  Change in appetite or weight.  Sense of loss or hopelessness.  Thoughts of suicide.  Anxiety, worry, or nervousness.  Trouble sleeping.  Avoiding family and friends.  Poor school performance.  Fighting or vandalism.  Reckless driving.  Skipping school.  Poor work Systems analyst.  Ignoring bills. Symptoms of adjustment disorder start within 3 months of the stressful life event. They do not last more than 6 months after the event has ended. DIAGNOSIS  To make a diagnosis, your health care provider will ask about what has happened in your life and how it has affected you. He or she may also ask about your medical history and use of medicines, alcohol, and other substances. Your health care provider may do a physical exam and order lab tests or other studies. You may be referred to a mental health specialist for evaluation. TREATMENT  Treatment options include:  Counseling or talk therapy. Talk therapy is usually provided by mental health specialists.  Medicine. Certain medicines may help with depression, anxiety, and sleep.  Support groups. Support groups offer emotional support, advice, and guidance. They are made up of  people who have had similar experiences. HOME CARE INSTRUCTIONS  Keep all follow-up visits as directed by your health care provider. This is important.  Take medicines only as directed by your health care provider. SEEK MEDICAL CARE IF:  Your symptoms get worse.  SEEK IMMEDIATE MEDICAL CARE IF: You have serious thoughts about hurting yourself or someone else. MAKE SURE YOU:  Understand these instructions.  Will watch your condition.  Will get help right away if you are not doing well or get worse.   This information is not intended to replace advice given to you by your health care provider. Make sure you discuss any questions you have with your health care provider.   Document Released: 11/06/2005 Document Revised: 03/25/2014 Document Reviewed: 07/27/2013 Elsevier Interactive Patient Education 2016 [employer].   Consider therapy/counseling for stress. If you want a referral I will place it for you, just call in.

## 2015-06-05 NOTE — Progress Notes (Signed)
Patient ID: [name], female   DOB: Jun 09, 1929, 82 y.o.   MRN: [id number]    [name] , [birth year], 82 y.o., female MRN: [id number]  CC: Sinus pressure Subjective:   Sinus pressure: Pt presents for acute dizziness of abut  10 days duration. Associated symptoms include sinus pressure, headache, PND, mild cough and bloody nose. Patient has continued her flonase and nasal saline. Her son has been staying with her and is ill as well. She was treated in Jan with similar symptoms that did resolve.   Acute stress reaction: Pt has been going through a stressful situation at home. There is family dynamic issues surrounding her husbands children. For this reason he has left [name] and their home. His family has been taking possessions from her home since this event. She has contacted police/lawyer and they are no longer allowed on the premises. Her daughter and son have been staying with her through this event. The patient states she feels like she even forgets some of the events that occurred, she thinks from stress. Her son is staying overnight to make certain she is ok. She has concerns her husband is ok, and that they are taking care of him. Her daughter feels she has improved over the last few weeks. She is taking 60 mg prozac daily. She tried to increase to 80 mg, but felt she became dizzy. Now she is not certain the dizziness was not from the sinus infection since she still has it.   Allergies  Allergen Reactions  . Atorvastatin Other (See Comments)    Severe headache  . Sulfonamide Derivatives Rash   Social History  Substance Use Topics  . Smoking status: Never Smoker   . Smokeless tobacco: Never Used  . Alcohol Use: No   Past Medical History  Diagnosis Date  . Anxiety disorder   . Arthritis   . Asthma   . Hx of adenomatous colonic polyps   . Hyperlipidemia   . Hypertension   . IBS (irritable bowel syndrome)   . Peptic ulcer disease   . Fatty liver   . Abnormal LFTs (liver  function tests)   . Diverticulosis   . Depression   . Abdominal bloating   . GERD (gastroesophageal reflux disease)   . Edema   . Fibromyalgia   . Thoracic radiculitis   . Lumbar radiculitis   . Skin cancer     shoulder, leg, right eye (? squamous by resection)  . Thyroid nodule     "removed"  . Urinary incontinence   . Giant cell arteritis (HCC)     No biopsy secondary to steroid use   Past Surgical History  Procedure Laterality Date  . Abdominal hysterectomy    . Thyroidectomy    . Nasal septum surgery    . Eye surgery Right 2006  . Back surgery  2005    lower back  . Right shoulder skin cancer excision  2012  . Right leg skin cancer surgery  2014  . Right knee surgery  2006    arthroscopy  . Liver biopsy      fatty liver   Family History  Problem Relation Age of Onset  . Colon polyps Maternal Aunt   . Diabetes Father   . Heart disease Father   . Heart disease Mother   . Lung disease Mother   . COPD Mother   . Colon cancer Neg Hx   . Kidney disease      niece-mat side  . Arthritis    .  Lung cancer Brother   . Cancer Brother     lung  . COPD Brother   . Stroke Brother   . Lung cancer Sister   . COPD Sister   . Diabetes Son      Medication List       This list is accurate as of: 06/05/15 11:13 AM.  Always use your most recent med list.               albuterol 108 (90 Base) MCG/ACT inhaler  Commonly known as:  PROAIR HFA  2 puffs up to every 4 hours if can't catch your breath     alendronate 70 MG tablet  Commonly known as:  FOSAMAX  Take 70 mg by mouth once a week. Take with a full glass of water on an empty stomach.     aspirin 81 MG tablet  Take 81 mg by mouth daily.     azelastine 0.1 % nasal spray  Commonly known as:  ASTELIN  Place 2 sprays into both nostrils at bedtime as needed. Use in each nostril as directed     AZOR 10-40 MG tablet  Generic drug:  amLODipine-olmesartan  Take 1 tablet by mouth at bedtime.     CHLOR-TRIMETON 4  MG tablet  Generic drug:  chlorpheniramine  2 tabs by mouth at bedtime.  May add 1 every 4 hours as needed for drippy nose, drainage, throat clearing.     dextromethorphan 30 MG/5ML liquid  Commonly known as:  DELSYM  Reported on 04/11/2015     diazepam 5 MG tablet  Commonly known as:  VALIUM  Take 1/2 tablet by mouth twice daily as needed for back spasms     esomeprazole 40 MG capsule  Commonly known as:  NEXIUM  Take 40 mg by mouth daily.     famotidine 20 MG tablet  Commonly known as:  PEPCID  Take 20 mg by mouth at bedtime.     ferrous sulfate 325 (65 FE) MG tablet  Take 1 tablet (325 mg total) by mouth 3 (three) times daily with meals.     FLUoxetine 20 MG capsule  Commonly known as:  PROZAC  Take 4 capsules (80 mg total) by mouth daily.     fluticasone 50 MCG/ACT nasal spray  Commonly known as:  FLONASE  Place 2 sprays into both nostrils daily.     folic acid 1 MG tablet  Commonly known as:  FOLVITE  Take 1 mg by mouth daily.     magic mouthwash Soln  Take 5 mLs by mouth 4 (four) times daily.     methotrexate 2.5 MG tablet  Commonly known as:  RHEUMATREX  Take 10 mg by mouth once a week. Caution:Chemotherapy. Protect from light.     oxycodone 5 MG capsule  Commonly known as:  OXY-IR  Take 1/2-1 tablet by mouth every 8 hours as needed for severe back pain     predniSONE 5 MG tablet  Commonly known as:  DELTASONE     predniSONE 1 MG tablet  Commonly known as:  DELTASONE        ROS: Negative, with the exception of above mentioned in HPI  Objective:  BP 139/78 mmHg  Pulse 71  Temp(Src) 98 F (36.7 C)  Resp 20  Wt 187 lb 4 oz (84.936 kg)  SpO2 95% Body mass index is 33.18 kg/(m^2). Gen: Afebrile. No acute distress. Nontoxic in appearance, well developed, well nourished female.  HENT: AT. [place]. Bilateral  TM visualized and normal in appearance. MMM, no oral lesions. Bilateral nares with erythema, swelling, bleeding. Throat without erythema or exudates.  No cough on exam. TTP max sinus. Eyes:Pupils Equal Round Reactive to light, Extraocular movements intact,  Conjunctiva without redness, discharge or icterus. Neck/lymp/endocrine: Supple, no lymphadenopathy CV: RRR  Chest: CTAB, no wheeze or crackles. Good air movement, normal resp effort.  Abd: Soft. NTND. BS present  Skin: No rashes, purpura or petechiae.  Neuro: PERLA. EOMi. Alert. Oriented x3 . Psych: Normal affect, dress and demeanor. Normal speech. Normal thought content and judgment.  Assessment/Plan: [name] is a 82 y.o. female present for  OV for  1. Acute maxillary sinusitis, recurrence not specified - amoxicillin-clavulanate (AUGMENTIN) 875-125 MG tablet; Take 1 tablet by mouth 2 (two) times daily.  Dispense: 20 tablet; Refill: 0 - Augmenitn, flonase, mucinex, nasal saline, rest and hydrate.  2. Acute stress reaction - Discussed in detail with pt an duaghter the sx she explains are likely secondary to her recent stress.  - She is to try the 80 mg prozac (increase from 60 mg) once acute illness resolved. If dizziness returns will need to decrease again to 60 mg and consider 2nd therapy addition.  - pt was given the opportunity for a referral to psych to discuss current situation. She declined, but if she feels she changes her mind, she was encouraged to call in and we would place that for her.  - pt currently has good support with her daughter and son.  - F/U as needed   electronically signed by:  Howard Pouch, DO  [place]

## 2015-07-11 ENCOUNTER — Telehealth: Payer: Self-pay | Admitting: Family Medicine

## 2015-07-11 ENCOUNTER — Ambulatory Visit (INDEPENDENT_AMBULATORY_CARE_PROVIDER_SITE_OTHER): Payer: Medicare Other | Admitting: Family Medicine

## 2015-07-11 ENCOUNTER — Encounter: Payer: Self-pay | Admitting: Family Medicine

## 2015-07-11 VITALS — BP 142/76 | HR 78 | Temp 98.9°F | Resp 20 | Ht 63.0 in | Wt 191.5 lb

## 2015-07-11 DIAGNOSIS — Z0000 Encounter for general adult medical examination without abnormal findings: Secondary | ICD-10-CM | POA: Insufficient documentation

## 2015-07-11 DIAGNOSIS — R413 Other amnesia: Secondary | ICD-10-CM | POA: Diagnosis not present

## 2015-07-11 DIAGNOSIS — E2839 Other primary ovarian failure: Secondary | ICD-10-CM | POA: Diagnosis not present

## 2015-07-11 DIAGNOSIS — M8580 Other specified disorders of bone density and structure, unspecified site: Secondary | ICD-10-CM

## 2015-07-11 DIAGNOSIS — E785 Hyperlipidemia, unspecified: Secondary | ICD-10-CM

## 2015-07-11 DIAGNOSIS — Z79899 Other long term (current) drug therapy: Secondary | ICD-10-CM | POA: Diagnosis not present

## 2015-07-11 LAB — CBC WITH DIFFERENTIAL/PLATELET
BASOS ABS: 0.1 10*3/uL (ref 0.0–0.1)
Basophils Relative: 0.6 % (ref 0.0–3.0)
EOS ABS: 0.2 10*3/uL (ref 0.0–0.7)
Eosinophils Relative: 1.1 % (ref 0.0–5.0)
HCT: 44.5 % (ref 36.0–46.0)
Hemoglobin: 14.9 g/dL (ref 12.0–15.0)
LYMPHS ABS: 1.3 10*3/uL (ref 0.7–4.0)
LYMPHS PCT: 8.6 % — AB (ref 12.0–46.0)
MCHC: 33.5 g/dL (ref 30.0–36.0)
MCV: 95 fl (ref 78.0–100.0)
MONO ABS: 0.9 10*3/uL (ref 0.1–1.0)
Monocytes Relative: 5.9 % (ref 3.0–12.0)
NEUTROS ABS: 13.1 10*3/uL — AB (ref 1.4–7.7)
NEUTROS PCT: 83.8 % — AB (ref 43.0–77.0)
PLATELETS: 285 10*3/uL (ref 150.0–400.0)
RBC: 4.69 Mil/uL (ref 3.87–5.11)
RDW: 14.8 % (ref 11.5–15.5)
WBC: 15.7 10*3/uL — ABNORMAL HIGH (ref 4.0–10.5)

## 2015-07-11 LAB — COMPREHENSIVE METABOLIC PANEL
ALT: 27 U/L (ref 0–35)
AST: 28 U/L (ref 0–37)
Albumin: 4.1 g/dL (ref 3.5–5.2)
Alkaline Phosphatase: 69 U/L (ref 39–117)
BILIRUBIN TOTAL: 0.3 mg/dL (ref 0.2–1.2)
BUN: 15 mg/dL (ref 6–23)
CO2: 31 meq/L (ref 19–32)
CREATININE: 0.75 mg/dL (ref 0.40–1.20)
Calcium: 9.5 mg/dL (ref 8.4–10.5)
Chloride: 100 mEq/L (ref 96–112)
GFR: 78.23 mL/min (ref >60.00)
GLUCOSE: 119 mg/dL — AB (ref 70–99)
Potassium: 4 mEq/L (ref 3.5–5.1)
SODIUM: 140 meq/L (ref 135–145)
Total Protein: 6.8 g/dL (ref 6.0–8.3)

## 2015-07-11 LAB — TSH: TSH: 1.75 u[IU]/mL (ref 0.35–4.50)

## 2015-07-11 MED ORDER — DOXYCYCLINE HYCLATE 100 MG PO TABS: 100.0000 mg | ORAL_TABLET | Freq: Two times a day (BID) | ORAL | Status: DC

## 2015-07-11 NOTE — Progress Notes (Signed)
Patient ID: [name], female   DOB: 25-Sep-1929, 82 y.o.   MRN: [id number] Medicare AWV    History of Present Ilness: [name], 82 y.o. , female presents today for Medicare wellness visit.  Vital Signs: BP 142/76 mmHg  Pulse 78  Temp(Src) 98.9 F (37.2 C) (Oral)  Resp 20  Ht '5\' 3"'  (1.6 m)  Wt 191 lb 8 oz (86.864 kg)  BMI 33.93 kg/m2  SpO2 94% List of providers/suppliers:  Updated in pts records (snapshot) Cardiology: Dr. Harrington Challenger Rheumatology: Dr. Gerilyn Nestle GI:Dr.  Olevia Perches Pulmonolgy: Dr. Melvyn Novas   Past medical, surgical, family and social histories reviewed (including experiences with illnesses, hospital stays, operations, injuries, and treatments):  Past Medical History  Diagnosis Date  . Anxiety disorder   . Arthritis   . Asthma   . Hx of adenomatous colonic polyps   . Hyperlipidemia   . Hypertension   . IBS (irritable bowel syndrome)   . Peptic ulcer disease   . Fatty liver   . Abnormal LFTs (liver function tests)   . Diverticulosis   . Depression   . Abdominal bloating   . GERD (gastroesophageal reflux disease)   . Edema   . Fibromyalgia   . Thoracic radiculitis   . Lumbar radiculitis   . Skin cancer     shoulder, leg, right eye (? squamous by resection)  . Thyroid nodule     "removed"  . Urinary incontinence   . Giant cell arteritis (HCC)     No biopsy secondary to steroid use  . Vertigo    All allergies reviewed Allergies  Allergen Reactions  . Atorvastatin Other (See Comments)    Severe headache  . Sulfonamide Derivatives Rash   Past Surgical History  Procedure Laterality Date  . Abdominal hysterectomy    . Thyroidectomy    . Nasal septum surgery    . Eye surgery Right 2006  . Back surgery  2005    lower back  . Right shoulder skin cancer excision  2012  . Right leg skin cancer surgery  2014  . Right knee surgery  2006    arthroscopy  . Liver biopsy      fatty liver   Family History  Problem Relation Age of Onset  . Colon polyps  Maternal Aunt   . Diabetes Father   . Heart disease Father   . Heart disease Mother   . Lung disease Mother   . COPD Mother   . Colon cancer Neg Hx   . Kidney disease      niece-mat side  . Arthritis    . Lung cancer Brother   . Cancer Brother     lung  . COPD Brother   . Stroke Brother   . Lung cancer Sister   . COPD Sister   . 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    Lives with [name] and [name].   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   Patient takes a daily vitamin   She wears her seat belt. Exercises greater than 3 times a week.   Wears 2 hearing aids. No dentures.   "Sometimes "requires assistive device for walking, either a walker or wheelchair.   There is a smoke detector in her home. There are firearms in her home. She feels safe in her relationships.    All medications verified   Medication List       This list is  accurate as of: 07/11/15 11:07 AM.  Always use your most recent med list.               albuterol 108 (90 Base) MCG/ACT inhaler  Commonly known as:  PROAIR HFA  2 puffs up to every 4 hours if can't catch your breath     alendronate 70 MG tablet  Commonly known as:  FOSAMAX  Take 70 mg by mouth once a week. Take with a full glass of water on an empty stomach.     aspirin 81 MG tablet  Take 81 mg by mouth daily.     azelastine 0.1 % nasal spray  Commonly known as:  ASTELIN  Place 2 sprays into both nostrils at bedtime as needed. Use in each nostril as directed     AZOR 10-40 MG tablet  Generic drug:  amLODipine-olmesartan  Take 1 tablet by mouth at bedtime.     CHLOR-TRIMETON 4 MG tablet  Generic drug:  chlorpheniramine  2 tabs by mouth at bedtime.  May add 1 every 4 hours as needed for drippy nose, drainage, throat clearing.     dextromethorphan 30 MG/5ML liquid  Commonly known as:  DELSYM  Reported on 04/11/2015     diazepam 5 MG tablet  Commonly known as:  VALIUM  Take  1/2 tablet by mouth twice daily as needed for back spasms     esomeprazole 40 MG capsule  Commonly known as:  NEXIUM  Take 40 mg by mouth daily.     famotidine 20 MG tablet  Commonly known as:  PEPCID  Take 20 mg by mouth at bedtime.     ferrous sulfate 325 (65 FE) MG tablet  Take 1 tablet (325 mg total) by mouth 3 (three) times daily with meals.     FLUoxetine 20 MG capsule  Commonly known as:  PROZAC  Take 4 capsules (80 mg total) by mouth daily.     fluticasone 50 MCG/ACT nasal spray  Commonly known as:  FLONASE  Place 2 sprays into both nostrils daily.     folic acid 1 MG tablet  Commonly known as:  FOLVITE  Take 1 mg by mouth daily.     magic mouthwash Soln  Take 5 mLs by mouth 4 (four) times daily.     methotrexate 2.5 MG tablet  Commonly known as:  RHEUMATREX  Take 10 mg by mouth once a week. Caution:Chemotherapy. Protect from light.     oxycodone 5 MG capsule  Commonly known as:  OXY-IR  Take 1/2-1 tablet by mouth every 8 hours as needed for severe back pain     predniSONE 5 MG tablet  Commonly known as:  DELTASONE     predniSONE 1 MG tablet  Commonly known as:  DELTASONE         Exercise/Diet: Current Exercise Habits: The patient does not participate in regular exercise at present   Diet: regular  Functional Status Survey: Is the patient deaf or have difficulty hearing?: Yes (hearing aids in use) Does the patient have difficulty seeing, even when wearing glasses/contacts?: No (implants model sn6ad1 (left 23.0D) (RIGHT 23.5d)) Does the patient have difficulty concentrating, remembering, or making decisions?: Yes Does the patient have difficulty walking or climbing stairs?: Yes (lift in use) Does the patient have difficulty dressing or bathing?: No Does the patient have difficulty doing errands alone such as visiting a doctor's office or shopping?: Yes (daughter assists patient)  Fall Risk  07/11/2015 03/16/2015  Falls in the  past year? Yes No   Number falls in past yr: 2 or more -  Injury with Fall? No -  Risk Factor Category  High Fall Risk -  Risk for fall due to : History of fall(s) Impaired balance/gait  Follow up Falls evaluation completed;Falls prevention discussed;Education provided -    Cognitive assessment:  Pt feels she is having memory loss. We have discussed her recent traumatic experience and depression can cause decline in memory. Pt was given information on dementia signs and TSH will be tested today. She will return for full evaluation with MMSE and consider neuropsych referral if necessary.   Depression Screening: Depression screen [facility] 2/9 07/11/2015 03/16/2015  Decreased Interest 0 0  Down, Depressed, Hopeless 0 0  PHQ - 2 Score 0 0    Advanced Care Planning: Code status on file: No MOST form information and copy given: Patient given most form and she will bring to office once completed and review with provider. [place] information provided: Patient was given form on health care power of attorney in advance directives today. She was given the packet to complete and have notarized. She was instructed to bring a copy of this to our office that we may enter it into her record.   Health maintenance:  Immunizations: Immunization History  Administered Date(s) Administered  . Influenza Split 12/16/2012, 12/30/2013  . Influenza Whole 12/17/2011  . Pneumococcal-Unspecified 12/17/2010, 12/30/2013  . Tdap 04/11/2015    Info -Pneumococcal (once in a lifetime after age 7); Seasonal Influenza annually; Tetanus ( Tdap ) once every 10 years- not covered by medicare; Zostavax - Shingles vaccine - not covered by Part A or B   Bone mass measurements Date of Study: "years ago"          Date of previous bone density study: ? Results By Site: Osteopenia, estrogen deficient, vitamin D deficient Recommendations: Patient is to take vitamin D supplementation Bone mineral density ordered today Info:   USPSTF: "B" recommendation to screen, >2 yr interval advised. Coverage: Medicare patients at risk for developing Osteoporosis. Medicare covers every 24 months or based on previous test.   Cardiovascular Screening Blood Tests Lipid Panel : Patient has had a history of prior elevated cholesterol, she is currently not on any therapy secondary to not able to tolerate statin group and her pulmonologist took her off of the fish oil supplementation. Discussed with her she needs cardiovascular protection with this level of cholesterol. Repeat cholesterol panel today and patient advised we may try another statin to see if she is able to tolerate, versus trial of a different medication altogether.    Component Value Date/Time   CHOL 267* 09/08/2014 1444   TRIG 376.0* 09/08/2014 1444   HDL 47.10 09/08/2014 1444   CHOLHDL 6 09/08/2014 1444   VLDL 75.2* 09/08/2014 1444   LDLDIRECT 156.0 09/08/2014 1444   Info:  USPSTF: "C" recommendation for no screening unless patient has risk factors, "A" recommendation to screen if has risk factors: HTN, DM, ASCVD, Fam Hx of ASCVD in men <50, women <60, Tobacco use, BMI > 30. USPSTF prefers Total Cholesterol and HDL for screening. USPSTF advises every 5 years Coverage: All asymptomic Medicare patients (No fast required for total and HDL measurement. 12-hr fast is required if lipid panel done)  Diabetes Screening Tests BMP Latest Ref Rng 04/11/2015 07/01/2014 07/13/2013  Glucose 70 - 99 mg/dL 105(H) 160(H) 223(H)  BUN 6 - 23 mg/dL '11 12 19  ' Creatinine 0.40 - 1.20  mg/dL 0.72 0.72 1.1  Sodium 135 - 145 mEq/L 141 138 139  Potassium 3.5 - 5.1 mEq/L 4.0 3.4(L) 5.0  Chloride 96 - 112 mEq/L 104 102 103  CO2 19 - 32 mEq/L '26 27 26  ' Calcium 8.4 - 10.5 mg/dL 9.0 9.5 9.5   Info USPSTF: "B" recommendation to screen if patient has sustained BP > 135/80; ([employer] covers 2 screening tests per year for patient diagnosed with pre-diabetes; 1 screening per year if previously tested,  but not diagnosed with pre-diabetes or if never tested) Coverage: - Medicare patients with certain risk factors for diabetes or diagnosed with pre-diabetes (patients previously diagnosed with diabetes aren't eligible for benefit)  Diabetes Self-Mgmt Training and Medical Nutrition Therapy  Date previously done: None Info: (Up to 10 hrs of initial training within a continuous 89moperiod; subsequent yrs up to 2hrs follow-up training each year after initial year) ;  Coverage: Medicare patients at risk for complications from diabetes, recently diagnosed with diabetes or previously diagnosed with diabetes (must certify DSMT need)   Colorectal Cancer Screening Last Colonoscopy: No longer indicated > 773Done by: Dr. BOlevia PerchesInfo USPSTF: "A" for CRC screening, ages 5108-74  3 screening methods: USPSTF says no method is preferred - Annual high-sensitivity FOBT OR - Flex sig Q 5 y + annual FOBT OR - Colonoscopy Q 10 y;  Medicare covers:  -Flexible sigmoidoscopy (473yr or once every 10 yrs after a screening colonoscopy) -Screening Colonoscopy (every 5 yrs if at high risk; every 10 yrs otherwise) -Fecal Occult Blood Test (annually) or Cologuard q3 years Coverage: Medicare covers for patients age 3350nd up - Screening colonoscopy: For those at high risk; no minimum age  Glaucoma Screening/vision screen: Date of Last ophthalmology evaluation: > 1 year, Dr. HaJerold CoombePt to make an appt.  Info USPSTF: "I" recommendation - Insufficent evidence to recommend for or against screening (Medicare coveres annually for patients in a high risk group);  Coverage: Medicare covers for patients with diabetes mellitus, family history of glaucoma, African-Americans age 3352nd over, or Hi47ge 6537nd up  Screening Pap tests and Pelvic Exam Not indicated >65, pt does not desire.   Info:  USPSTF: "D" recommendation against cervical cancer screening for women > age 7548ith adequate screening history not  at high risk for cervical cancer.  (Medicare covers annually if high-risk, or childbearing age with abnormal Pap test within past 3 yrs; every 24 months for all other women); Medicare covers for all female Medicare patients  Screening Mammography NOT INDICATED >75- pt does not desire.  Info:  USPSTF "B" recommendation for mammography every 2 years ages 5081-74 (Medicare covers annually); Medicare covers for all female patients 4037r older   AAA/EKG Screening: completed if indicated and medicare welcome visit.   Alcohol abuse screening: completed if indicated  STIs screening: Completed if indicated  Assessment and Plan: Osteopenia/estrogen deficient - DG Bone Density; Future - CBC w/Diff- anemia screening - Comp Met (CMET)- high risk medications.   Memory loss - TSH Pt feels she is having memory loss. We have discussed her recent traumatic experience and depression can cause decline in memory. Pt was given information on dementia signs and TSH will be tested today. She will return for full evaluation with MMSE and consider neuropsych referral if necessary.  Hyperlipidemia - pt currently not on statin therapy secondary to statin intolerance and pulmonology removed her from fish oil.  - Lipid Profile; Future  Education and Counseling Provided:  See  after visit summary that was printed and given to patient 1. Tobacco abuse counseling 2. Alcohol abuse counseling 3. STIs counseling 4. Referrals made  Referrals/orders made if indicated: 1. IBT (Intensive Behavior Therapy) for Cardiovascular disease 2. IBT for Obesity 3. MNT (Medical Nutrition Therapy)  4. Behavioral Counseling for Alcohol abuse  5. HIBT (High Intensity Behavioral Counseling) to prevent STIs (Sexually Transmitted Infections) 6. [place] for AAA screening - IPPE only 7. EKG screening- IPPE only 8. Low dose CT- lung cancer screen 9. Pelvic/PAP exam 10. PSA/Prostate 11. Screen mammogram 12. HIV screen 13. Hep C  screen 14. Glaucoma screen 15. Diabetes screen 16. Colon cancer screen 17. DEXA  Vaccinations Administered if indicated: 1. Influenza 2. PPV23/prevnar 13 3. Hepatitis B 4. Tdap - print script 5. Shingle's vaccination- print script  Electronically Signed by: Howard Pouch, DO [place]

## 2015-07-11 NOTE — Patient Instructions (Addendum)
[name] , Thank you for taking time to come for your Medicare Wellness Visit. I appreciate your ongoing commitment to your health goals. Please review the following plan we discussed and let me know if I can assist you in the future.   These are the goals we discussed: Goals    None      This is a list of the screening recommended for you and due dates:  Health Maintenance  Topic Date Due  . DEXA scan (bone density measurement)  06/07/1996  . Flu Shot  10/17/2015  . Tetanus Vaccine  04/10/2025  . Shingles Vaccine  Completed  . Pneumonia vaccines  Completed    Dementia Dementia is a general term for problems with brain function. A person with dementia has memory loss and a hard time with at least one other brain function such as thinking, speaking, or problem solving. Dementia can affect social functioning, how you do your job, your mood, or your personality. The changes may be hidden for a long time. The earliest forms of this disease are usually not detected by family or friends. Dementia can be:  Irreversible.  Potentially reversible.  Partially reversible.  Progressive. This means it can get worse over time. CAUSES  Irreversible dementia causes may include:  Degeneration of brain cells (Alzheimer disease or Lewy body dementia).  Multiple small strokes (vascular dementia).  Infection (chronic meningitis or Creutzfeldt-Jakob disease).  Frontotemporal dementia. This affects younger people, age 55 to 43, compared to those who have Alzheimer disease.  Dementia associated with other disorders like Parkinson disease, Huntington disease, or HIV-associated dementia. Potentially or partially reversible dementia causes may include:  Medicines.  Metabolic causes such as excessive alcohol intake, vitamin B12 deficiency, or thyroid disease.  Masses or pressure in the brain such as a tumor, blood clot, or hydrocephalus. SIGNS AND SYMPTOMS  Symptoms are often hard to detect.  Family members or coworkers may not notice them early in the disease process. Different people with dementia may have different symptoms. Symptoms can include:  A hard time with memory, especially recent memory. Long-term memory may not be impaired.  Asking the same question multiple times or forgetting something someone just said.  A hard time speaking your thoughts or finding certain words.  A hard time solving problems or performing familiar tasks (such as how to use a telephone).  Sudden changes in mood.  Changes in personality, especially increasing moodiness or mistrust.  Depression.  A hard time understanding complex ideas that were never a problem in the past. DIAGNOSIS  There are no specific tests for dementia.   Your health care provider may recommend a thorough evaluation. This is because some forms of dementia can be reversible. The evaluation will likely include a physical exam and getting a detailed history from you and a family member. The history often gives the best clues and suggestions for a diagnosis.  Memory testing may be done. A detailed brain function evaluation called neuropsychologic testing may be helpful.  Lab tests and brain imaging (such as a CT scan or MRI scan) are sometimes important.  Sometimes observation and re-evaluation over time is very helpful. TREATMENT  Treatment depends on the cause.   If the problem is a vitamin deficiency, it may be helped or cured with supplements.  For dementias such as Alzheimer disease, medicines are available to stabilize or slow the course of the disease. There are no cures for this type of dementia.  Your health care provider can help  direct you to groups, organizations, and other health care providers to help with decisions in the care of you or your loved one. HOME CARE INSTRUCTIONS The care of individuals with dementia is varied and dependent upon the progression of the dementia. The following suggestions are  intended for the person living with, or caring for, the person with dementia.  Create a safe environment.  Remove the locks on bathroom doors to prevent the person from accidentally locking himself or herself in.  Use childproof latches on kitchen cabinets and any place where cleaning supplies, chemicals, or alcohol are kept.  Use childproof covers in unused electrical outlets.  Install childproof devices to keep doors and windows secured.  Remove stove knobs or install safety knobs and an automatic shut-off on the stove.  Lower the temperature on water heaters.  Label medicines and keep them locked up.  Secure knives, lighters, matches, power tools, and guns, and keep these items out of reach.  Keep the house free from clutter. Remove rugs or anything that might contribute to a fall.  Remove objects that might break and hurt the person.  Make sure lighting is good, both inside and outside.  Install grab rails as needed.  Use a monitoring device to alert you to falls or other needs for help.  Reduce confusion.  Keep familiar objects and people around.  Use night lights or dim lights at night.  Label items or areas.  Use reminders, notes, or directions for daily activities or tasks.  Keep a simple, consistent routine for waking, meals, bathing, dressing, and bedtime.  Create a calm, quiet environment.  Place large clocks and calendars prominently.  Display emergency numbers and home address near all telephones.  Use cues to establish different times of the day. An example is to open curtains to let the natural light in during the day.   Use effective communication.  Choose simple words and short sentences.  Use a gentle, calm tone of voice.  Be careful not to interrupt.  If the person is struggling to find a word or communicate a thought, try to provide the word or thought.  Ask one question at a time. Allow the person ample time to answer questions. Repeat  the question again if the person does not respond.  Reduce nighttime restlessness.  Provide a comfortable bed.  Have a consistent nighttime routine.  Ensure a regular walking or physical activity schedule. Involve the person in daily activities as much as possible.  Limit napping during the day.  Limit caffeine.  Attend social events that stimulate rather than overwhelm the senses.  Encourage good nutrition and hydration.  Reduce distractions during meal times and snacks.  Avoid foods that are too hot or too cold.  Monitor chewing and swallowing ability.  Continue with routine vision, hearing, dental, and medical screenings.  Give medicines only as directed by the health care provider.  Monitor driving abilities. Do not allow the person to drive when safe driving is no longer possible.  Register with an identification program which could provide location assistance in the event of a missing person situation. SEEK MEDICAL CARE IF:   New behavioral problems start such as moodiness, aggressiveness, or seeing things that are not there (hallucinations).  Any new problem with brain function happens. This includes problems with balance, speech, or falling a lot.  Problems with swallowing develop.  Any symptoms of other illness happen. Small changes or worsening in any aspect of brain function can be a sign  that the illness is getting worse. It can also be a sign of another medical illness such as infection. Seeing a health care provider right away is important. SEEK IMMEDIATE MEDICAL CARE IF:   A fever develops.  New or worsened confusion develops.  New or worsened sleepiness develops.  Staying awake becomes hard to do.   This information is not intended to replace advice given to you by your health care provider. Make sure you discuss any questions you have with your health care provider.   Document Released: 08/28/2000 Document Revised: 03/25/2014 Document Reviewed:  07/30/2010 Elsevier Interactive Patient Education 2016 [employer].  Vertigo Vertigo means you feel like you or your surroundings are moving when they are not. Vertigo can be dangerous if it occurs when you are at work, driving, or performing difficult activities.  CAUSES  Vertigo occurs when there is a conflict of signals sent to your brain from the visual and sensory systems in your body. There are many different causes of vertigo, including:  Infections, especially in the inner ear.  A bad reaction to a drug or misuse of alcohol and medicines.  Withdrawal from drugs or alcohol.  Rapidly changing positions, such as lying down or rolling over in bed.  A migraine headache.  Decreased blood flow to the brain.  Increased pressure in the brain from a head injury, infection, tumor, or bleeding. SYMPTOMS  You may feel as though the world is spinning around or you are falling to the ground. Because your balance is upset, vertigo can cause nausea and vomiting. You may have involuntary eye movements (nystagmus). DIAGNOSIS  Vertigo is usually diagnosed by physical exam. If the cause of your vertigo is unknown, your caregiver may perform imaging tests, such as an MRI scan (magnetic resonance imaging). TREATMENT  Most cases of vertigo resolve on their own, without treatment. Depending on the cause, your caregiver may prescribe certain medicines. If your vertigo is related to body position issues, your caregiver may recommend movements or procedures to correct the problem. In rare cases, if your vertigo is caused by certain inner ear problems, you may need surgery. HOME CARE INSTRUCTIONS   Follow your caregiver's instructions.  Avoid driving.  Avoid operating heavy machinery.  Avoid performing any tasks that would be dangerous to you or others during a vertigo episode.  Tell your caregiver if you notice that certain medicines seem to be causing your vertigo. Some of the medicines used to  treat vertigo episodes can actually make them worse in some people. SEEK IMMEDIATE MEDICAL CARE IF:   Your medicines do not relieve your vertigo or are making it worse.  You develop problems with talking, walking, weakness, or using your arms, hands, or legs.  You develop severe headaches.  Your nausea or vomiting continues or gets worse.  You develop visual changes.  A family member notices behavioral changes.  Your condition gets worse. MAKE SURE YOU:  Understand these instructions.  Will watch your condition.  Will get help right away if you are not doing well or get worse.   This information is not intended to replace advice given to you by your health care provider. Make sure you discuss any questions you have with your health care provider.   Document Released: 12/12/2004 Document Revised: 05/27/2011 Document Reviewed: 06/27/2014 Elsevier Interactive Patient Education [employer].

## 2015-07-11 NOTE — Telephone Encounter (Signed)
Please call pt: - her labs are normal, with the exception of an elevated white count, which is likely secondary to her sinus issues she is experiencing. I have prescribed a different abx than prior to cover her sinus problems. If her symptoms are not completely resolved with medication and/or dizziness remains, I will need to see her to discuss and consider referral to specialist to address.

## 2015-07-12 MED ORDER — DOXYCYCLINE HYCLATE 100 MG PO TABS: 100.0000 mg | ORAL_TABLET | Freq: Two times a day (BID) | ORAL | Status: DC

## 2015-07-12 NOTE — Telephone Encounter (Signed)
Spoke with patient daughter reviewed lab results and complete instructions. Patient daughter verbalized understanding of all instructions.

## 2015-07-13 DIAGNOSIS — Z79899 Other long term (current) drug therapy: Secondary | ICD-10-CM | POA: Diagnosis not present

## 2015-07-13 DIAGNOSIS — M316 Other giant cell arteritis: Secondary | ICD-10-CM | POA: Diagnosis not present

## 2015-07-21 DIAGNOSIS — M85851 Other specified disorders of bone density and structure, right thigh: Secondary | ICD-10-CM | POA: Diagnosis not present

## 2015-07-21 DIAGNOSIS — M85832 Other specified disorders of bone density and structure, left forearm: Secondary | ICD-10-CM | POA: Diagnosis not present

## 2015-07-21 DIAGNOSIS — M85852 Other specified disorders of bone density and structure, left thigh: Secondary | ICD-10-CM | POA: Diagnosis not present

## 2015-08-02 ENCOUNTER — Telehealth: Payer: Self-pay | Admitting: Family Medicine

## 2015-08-02 NOTE — Telephone Encounter (Signed)
Received patient's bone density scan dated from this month. Uncertain if she has been made aware of these results since it was completed at [facility]. - Briefly her results are mildly worsening from her prior results in 2011, first measurement from right hip. She has been started on Fosamax recently. This should hopefully help treat and prevent further progression of her bone density

## 2015-08-03 NOTE — Telephone Encounter (Signed)
Spoke with patient daughter reviewed results and information. [name] verbalized understanding.

## 2015-08-11 ENCOUNTER — Other Ambulatory Visit: Payer: Self-pay | Admitting: *Deleted

## 2015-08-11 DIAGNOSIS — J0100 Acute maxillary sinusitis, unspecified: Secondary | ICD-10-CM

## 2015-08-11 MED ORDER — FLUTICASONE PROPIONATE 50 MCG/ACT NA SUSP: 2.0000 | Freq: Every day | NASAL | Status: DC

## 2015-08-11 NOTE — Telephone Encounter (Signed)
RF request for fluticasone LOV: 07/11/15 Next ov: None Last written: 03/31/15 #16g w/ 6RF

## 2015-08-15 ENCOUNTER — Encounter: Payer: Self-pay | Admitting: Family Medicine

## 2015-08-15 ENCOUNTER — Other Ambulatory Visit (INDEPENDENT_AMBULATORY_CARE_PROVIDER_SITE_OTHER): Payer: Medicare Other

## 2015-08-15 DIAGNOSIS — E785 Hyperlipidemia, unspecified: Secondary | ICD-10-CM | POA: Diagnosis not present

## 2015-08-15 LAB — LIPID PANEL
CHOL/HDL RATIO: 5
Cholesterol: 227 mg/dL — ABNORMAL HIGH (ref 0–200)
HDL: 47 mg/dL (ref >39.00)
NONHDL: 180.03
TRIGLYCERIDES: 219 mg/dL — AB (ref 0.0–149.0)
VLDL: 43.8 mg/dL — ABNORMAL HIGH (ref 0.0–40.0)

## 2015-08-15 LAB — LDL CHOLESTEROL, DIRECT: Direct LDL: 136 mg/dL

## 2015-08-16 ENCOUNTER — Telehealth: Payer: Self-pay | Admitting: Family Medicine

## 2015-08-16 DIAGNOSIS — E785 Hyperlipidemia, unspecified: Secondary | ICD-10-CM

## 2015-08-16 MED ORDER — SIMVASTATIN 20 MG PO TABS: 20.0000 mg | ORAL_TABLET | Freq: Every day | ORAL | Status: DC

## 2015-08-16 NOTE — Telephone Encounter (Signed)
Please call pt: - her cholesterol panel is elevated, although a little better than the year prior. She had been unable to tolerate Lipitor, but not believed to be tried on any other statin. We discussed in her visit a trial of a different statin if cholesterol was high secondary to her CV risk and Fhx. I Would like to try at least low dose statin (different one) to see if she can tolerate it, since it would be the best protection for her. Would start every other day, then work up to daily if no side effects.  -simvastatin 20 mg- is low dose, but will provide some  Cardiovascular protection. Encourage her to take at night, every other day for a week, and if no side effects, start 1 pill every night. I will need to follow up with her for this in 8 weeks to see if she is doing ok and check her labs after starting medication.

## 2015-08-16 NOTE — Telephone Encounter (Signed)
Spoke with patient daughter [name] reviewed results and all instructions. Sent medication to [facility]. Patient Daughter verbalized understanding of all instructions.

## 2015-08-24 ENCOUNTER — Telehealth: Payer: Self-pay | Admitting: Family Medicine

## 2015-08-24 NOTE — Telephone Encounter (Signed)
Daughter needs a letter stating that her mother needs 24 hour care. She has a couple that will be moving next door to her mother to assist her with caring for her mother. They need to place a modular home on her property. [facility] is requesting a statement from [name]'s doctor stating she needs a high level of care so that the couple can place the modular home on the property. Please feel free to call with any questions.

## 2015-08-25 NOTE — Telephone Encounter (Signed)
There is a process for evaluation for these matters. Patient will need to make an appt to be seen, so that we can discuss circumstances and then aI can place the referral needed to have the evaluation completed.

## 2015-08-25 NOTE — Telephone Encounter (Signed)
Left message with detailed instructions on daughters voice mail.

## 2015-09-05 ENCOUNTER — Encounter: Payer: Self-pay | Admitting: Family Medicine

## 2015-09-05 ENCOUNTER — Ambulatory Visit (INDEPENDENT_AMBULATORY_CARE_PROVIDER_SITE_OTHER): Payer: Medicare Other | Admitting: Family Medicine

## 2015-09-05 VITALS — BP 132/67 | HR 84 | Temp 98.5°F | Resp 20 | Wt 189.5 lb

## 2015-09-05 DIAGNOSIS — Z0289 Encounter for other administrative examinations: Secondary | ICD-10-CM

## 2015-09-05 NOTE — Patient Instructions (Signed)
I will attempt to write a letter for you to see if we can get the home on your land.

## 2015-09-05 NOTE — Progress Notes (Signed)
Patient ID: Marlaina Maturino, female   DOB: Mar 14, 1930, 82 y.o.   MRN: [id number]    Shawnn Rottman , 07/20/1929, 82 y.o., female MRN: [id number]  CC: home health needs Subjective:   Patient is present today with her daughter. Patient currently lives in the home independently. Her son will stay with her when he is in town. Her daughter checks on her daily. Patient does not cook or provide her own meals. Patient needs assistance with IDLs and some ADLs. She is present today asking for a letter from this provider to state that she could benefit from daily assistance. Patient does not want to higher outside assistance, but would like to have a family friend be able to move on her property (8 acres) to help care for her, and be available for emergencies. This person will need to move her mobile home onto the property. They plan to move and older mobile home off the property and place the new home in the same position. Patient states the county told her since she is "land locked" in no state Road access to her 49 acres she will need an excuse from her doctor to endorse her need.  Allergies  Allergen Reactions  . Atorvastatin Other (See Comments)    Severe headache  . Sulfonamide Derivatives Rash   Social History  Substance Use Topics  . Smoking status: Never Smoker   . Smokeless tobacco: Never Used  . Alcohol Use: No   Past Medical History  Diagnosis Date  . Anxiety disorder   . Arthritis   . Asthma   . Hx of adenomatous colonic polyps   . Hyperlipidemia   . Hypertension   . IBS (irritable bowel syndrome)   . Peptic ulcer disease   . Fatty liver   . Abnormal LFTs (liver function tests)   . Diverticulosis   . Depression   . Abdominal bloating   . GERD (gastroesophageal reflux disease)   . Edema   . Fibromyalgia   . Thoracic radiculitis   . Lumbar radiculitis   . Skin cancer     shoulder, leg, right eye (? squamous by resection)  . Thyroid nodule     "removed"  . Urinary incontinence    . Giant cell arteritis (HCC)     No biopsy secondary to steroid use  . Vertigo    Past Surgical History  Procedure Laterality Date  . Abdominal hysterectomy    . Thyroidectomy    . Nasal septum surgery    . Eye surgery Right 2006  . Back surgery  2005    lower back  . Right shoulder skin cancer excision  2012  . Right leg skin cancer surgery  2014  . Right knee surgery  2006    arthroscopy  . Liver biopsy      fatty liver   Family History  Problem Relation Age of Onset  . Colon polyps Maternal Aunt   . Diabetes Father   . Heart disease Father   . Heart disease Mother   . Lung disease Mother   . COPD Mother   . Colon cancer Neg Hx   . Kidney disease      niece-mat side  . Arthritis    . Lung cancer Brother   . Cancer Brother     lung  . COPD Brother   . Stroke Brother   . Lung cancer Sister   . COPD Sister   . Diabetes Son      Medication List  This list is accurate as of: 09/05/15  1:36 PM.  Always use your most recent med list.               albuterol 108 (90 Base) MCG/ACT inhaler  Commonly known as:  PROAIR HFA  2 puffs up to every 4 hours if can't catch your breath     alendronate 70 MG tablet  Commonly known as:  FOSAMAX  Take 70 mg by mouth once a week. Take with a full glass of water on an empty stomach.     aspirin 81 MG tablet  Take 81 mg by mouth daily.     azelastine 0.1 % nasal spray  Commonly known as:  ASTELIN  Place 2 sprays into both nostrils at bedtime as needed. Use in each nostril as directed     AZOR 10-40 MG tablet  Generic drug:  amLODipine-olmesartan  Take 1 tablet by mouth at bedtime.     CHLOR-TRIMETON 4 MG tablet  Generic drug:  chlorpheniramine  2 tabs by mouth at bedtime.  May add 1 every 4 hours as needed for drippy nose, drainage, throat clearing.     dextromethorphan 30 MG/5ML liquid  Commonly known as:  DELSYM  Reported on 09/05/2015     diazepam 5 MG tablet  Commonly known as:  VALIUM  Take 1/2 tablet  by mouth twice daily as needed for back spasms     esomeprazole 40 MG capsule  Commonly known as:  NEXIUM  Take 40 mg by mouth daily.     famotidine 20 MG tablet  Commonly known as:  PEPCID  Take 20 mg by mouth at bedtime.     ferrous sulfate 325 (65 FE) MG tablet  Take 1 tablet (325 mg total) by mouth 3 (three) times daily with meals.     FLUoxetine 20 MG capsule  Commonly known as:  PROZAC  Take 4 capsules (80 mg total) by mouth daily.     fluticasone 50 MCG/ACT nasal spray  Commonly known as:  FLONASE  Place 2 sprays into both nostrils daily.     folic acid 1 MG tablet  Commonly known as:  FOLVITE  Take 1 mg by mouth daily.     magic mouthwash Soln  Take 5 mLs by mouth 4 (four) times daily.     methotrexate 2.5 MG tablet  Commonly known as:  RHEUMATREX  Take 10 mg by mouth once a week. Caution:Chemotherapy. Protect from light.     oxycodone 5 MG capsule  Commonly known as:  OXY-IR  Take 1/2-1 tablet by mouth every 8 hours as needed for severe back pain     predniSONE 5 MG tablet  Commonly known as:  DELTASONE     predniSONE 1 MG tablet  Commonly known as:  DELTASONE     simvastatin 20 MG tablet  Commonly known as:  ZOCOR  Take 1 tablet (20 mg total) by mouth at bedtime.       ROS: Negative, with the exception of above mentioned in HPI  Objective:  BP 132/67 mmHg  Pulse 84  Temp(Src) 98.5 F (36.9 C)  Resp 20  Wt 189 lb 8 oz (85.957 kg)  SpO2 93% Body mass index is 33.58 kg/(m^2). Gen: Afebrile. No acute distress. Nontoxic in appearance, well-developed, well-nourished, pleasant Caucasian female, obese. HENT: AT. [place].  MMM, no oral lesions. Eyes:Pupils Equal Round Reactive to light, Extraocular movements intact,  Conjunctiva without redness, discharge or icterus. CV: RRR, +1 edema Chest: CTAB,  no wheeze or crackles. Good air movement, normal resp effort.  Abd: Soft. NTND. BS present.  Skin: Skin intact Neuro: Slow gait. PERLA. EOMi. Alert. Oriented x3   Psych: Normal affect, dress and demeanor. Normal speech. Normal thought content and judgment.  Assessment/Plan: Patina Mccartha is a 82 y.o. female present for acute OV for:  Encounter for completion of form with patient - Discussed with patient and her daughter that I can provide a letter for them stating that she could "benefit " from a care provider living near her for emergencies, personal assistance, home care and provide meals daily. Discussed with patient I do not feel she needs personal 24-hour care, and in stating so would suggest she should not live independently. Patient prefers not to have home health eval/social worker or other assistance in the home from caregiver agencies, since she feels she can provide the same without any cost to the system by having a caregiver she is familiar with near.   electronically signed by:  Howard Pouch, DO  [place]

## 2015-09-06 DIAGNOSIS — Z961 Presence of intraocular lens: Secondary | ICD-10-CM | POA: Diagnosis not present

## 2015-09-06 DIAGNOSIS — H04213 Epiphora due to excess lacrimation, bilateral lacrimal glands: Secondary | ICD-10-CM | POA: Diagnosis not present

## 2015-09-06 DIAGNOSIS — H04223 Epiphora due to insufficient drainage, bilateral lacrimal glands: Secondary | ICD-10-CM | POA: Diagnosis not present

## 2015-09-06 DIAGNOSIS — H26491 Other secondary cataract, right eye: Secondary | ICD-10-CM | POA: Diagnosis not present

## 2015-09-07 ENCOUNTER — Encounter: Payer: Self-pay | Admitting: Family Medicine

## 2015-09-07 DIAGNOSIS — H35031 Hypertensive retinopathy, right eye: Secondary | ICD-10-CM | POA: Insufficient documentation

## 2015-09-07 DIAGNOSIS — H35033 Hypertensive retinopathy, bilateral: Secondary | ICD-10-CM | POA: Insufficient documentation

## 2015-09-25 ENCOUNTER — Other Ambulatory Visit: Payer: Self-pay | Admitting: *Deleted

## 2015-09-25 DIAGNOSIS — E785 Hyperlipidemia, unspecified: Secondary | ICD-10-CM

## 2015-09-25 MED ORDER — SIMVASTATIN 20 MG PO TABS: 20.0000 mg | ORAL_TABLET | Freq: Every day | ORAL | Status: DC

## 2015-09-25 NOTE — Telephone Encounter (Signed)
zocor refilled.

## 2015-09-26 ENCOUNTER — Telehealth: Payer: Self-pay | Admitting: *Deleted

## 2015-09-27 NOTE — Telephone Encounter (Signed)
[facility] called to get authorization for 90 day supply on patient zocor. Ok per Dr Raoul Pitch to dispense 90 day supply.

## 2015-09-29 DIAGNOSIS — M316 Other giant cell arteritis: Secondary | ICD-10-CM | POA: Diagnosis not present

## 2015-09-29 DIAGNOSIS — Z79899 Other long term (current) drug therapy: Secondary | ICD-10-CM | POA: Diagnosis not present

## 2015-10-16 DIAGNOSIS — J301 Allergic rhinitis due to pollen: Secondary | ICD-10-CM | POA: Diagnosis not present

## 2015-10-16 DIAGNOSIS — H9203 Otalgia, bilateral: Secondary | ICD-10-CM | POA: Diagnosis not present

## 2015-11-08 ENCOUNTER — Other Ambulatory Visit: Payer: Self-pay | Admitting: *Deleted

## 2015-11-08 MED ORDER — AMLODIPINE-OLMESARTAN 10-40 MG PO TABS: 1.0000 | ORAL_TABLET | Freq: Every day | ORAL | 1 refills | Status: DC

## 2015-11-08 NOTE — Telephone Encounter (Signed)
Patient daughter called requesting refill on patient BP medication. Scheduled appt for follow up on her HTN. Sent refill to patient pharmacy.

## 2015-11-16 DIAGNOSIS — D1801 Hemangioma of skin and subcutaneous tissue: Secondary | ICD-10-CM | POA: Diagnosis not present

## 2015-11-16 DIAGNOSIS — Z85828 Personal history of other malignant neoplasm of skin: Secondary | ICD-10-CM | POA: Diagnosis not present

## 2015-11-16 DIAGNOSIS — D485 Neoplasm of uncertain behavior of skin: Secondary | ICD-10-CM | POA: Diagnosis not present

## 2015-11-16 DIAGNOSIS — L821 Other seborrheic keratosis: Secondary | ICD-10-CM | POA: Diagnosis not present

## 2015-11-16 DIAGNOSIS — C4441 Basal cell carcinoma of skin of scalp and neck: Secondary | ICD-10-CM | POA: Diagnosis not present

## 2015-11-16 DIAGNOSIS — L814 Other melanin hyperpigmentation: Secondary | ICD-10-CM | POA: Diagnosis not present

## 2015-12-07 ENCOUNTER — Other Ambulatory Visit: Payer: Self-pay | Admitting: Family Medicine

## 2015-12-07 DIAGNOSIS — E785 Hyperlipidemia, unspecified: Secondary | ICD-10-CM

## 2015-12-12 ENCOUNTER — Ambulatory Visit (INDEPENDENT_AMBULATORY_CARE_PROVIDER_SITE_OTHER): Payer: Medicare Other | Admitting: Family Medicine

## 2015-12-12 ENCOUNTER — Encounter: Payer: Self-pay | Admitting: Family Medicine

## 2015-12-12 VITALS — BP 153/79 | HR 79 | Temp 98.6°F | Resp 20 | Wt 193.2 lb

## 2015-12-12 DIAGNOSIS — I10 Essential (primary) hypertension: Secondary | ICD-10-CM

## 2015-12-12 DIAGNOSIS — E785 Hyperlipidemia, unspecified: Secondary | ICD-10-CM | POA: Diagnosis not present

## 2015-12-12 DIAGNOSIS — J4540 Moderate persistent asthma, uncomplicated: Secondary | ICD-10-CM | POA: Diagnosis not present

## 2015-12-12 DIAGNOSIS — Z23 Encounter for immunization: Secondary | ICD-10-CM

## 2015-12-12 MED ORDER — FLUOXETINE HCL 20 MG PO CAPS: 60.0000 mg | ORAL_CAPSULE | Freq: Every day | ORAL | 1 refills | Status: DC

## 2015-12-12 NOTE — Patient Instructions (Signed)
Call with blood pressure medicine name. I will dig through your chart and decide where to send for the shortness of breath. You can discontinue the statin all together.

## 2015-12-12 NOTE — Progress Notes (Signed)
Subjective:    Patient ID: [name], female    DOB: 24-Dec-1929, 82 y.o.   MRN: [id number]  HPI  Patient presents for scheduled office visit on chronic medical issues. Her past medical history, allergies, surgeries, family history and social history has been updated today.  Hypertension/dyspnea of exertion/asthma: Patient is currently prescribed amlodipine/valsartan 10-40 milligrams daily. She states the mail in pharmacy sent her the wrong one and it made her see and hear things. I had asked her daughter to check the label when she went home and it was the correct medicine, just generic instead of being labeled AZOR. She states she has not taken this medicine in a few weeks. She also stopped using her statin again, and does not desire to restart. She does take a fish oil supplement on occassions.  She Closely monitor the salt content in her diet. She takes a daily baby aspirin.  She denies any chest pain, visual changes or lower extremity edema. She does admit to increase in dyspnea on exertion. She reports she easily fatigues adnd feels like she just cant breath. Per electronic medical review, it appears patient had a stress test and a nuclear scan through Dr. Alan Ripper office 623/2016.  She has a history of coronary artery disease and abnormal EKG. Risk factors include hypertension, hyperlipidemia, former smoker. That time she was having chest pain, dyspnea on exertion and shortness of breath. Study 08/11/2014 was with normal EKG, no ST segment deviation stress. No arrhythmias. She did report chest discomfort and shortness of breath during the stress test. 2-D echocardiogram without contrast 07/04/2014 with an ejection fraction of 55-60%.  Grade 1 diastolic dysfunction. Mild aortic valve regurg. Trace mitral regurg and tricuspid regurg. She has also seen pulmonology 02/2014, she has been diagnosed with moderate persistent asthma 11/30/2012. Pt had been on advair but had mouth irritation and discontinued.  She was then started on Dulera 100 and symbicort 80 mg 2 puffs BID. She is not currently reporting use of these meds.    Depression with Anxiety: Patient states she takes Prozac 60 mg daily for her depression with anxiety. She has been going through a great deal with her husband leaving her. She states she feels better. Her daughter feels she is still depressed.  Patient does have good support system with her daughter and they have hired live in help during the day for her mother. She does admit to hearing "music" and seeing "things" in the past, last time a few weeks ago. She reports that is why she stopped BP med, because she thought it was from the pharmacy sending the wrong medicine. She had been to ENT in the past and told it was a normal exam.   Past Medical History:  Diagnosis Date  . Abdominal bloating   . Abnormal LFTs (liver function tests)   . Anxiety disorder   . Arthritis   . Asthma   . Depression   . Diverticulosis   . Edema   . Fatty liver   . Fibromyalgia   . GERD (gastroesophageal reflux disease)   . Giant cell arteritis (HCC)    No biopsy secondary to steroid use  . Hx of adenomatous colonic polyps   . Hyperlipidemia   . Hypertension   . IBS (irritable bowel syndrome)   . Lumbar radiculitis   . Peptic ulcer disease   . Skin cancer    shoulder, leg, right eye (? squamous by resection)  . Thoracic radiculitis   . Thyroid nodule    "  removed"  . Urinary incontinence   . Vertigo    Allergies  Allergen Reactions  . Atorvastatin Other (See Comments)    Severe headache  . Sulfonamide Derivatives Rash   Social History  Substance Use Topics  . Smoking status: Never Smoker  . Smokeless tobacco: Never Used  . Alcohol use No     Review of Systems Negative, with the exception of above mentioned in HPI     Objective:   Physical Exam BP (!) 153/79 (BP Location: Right Arm, Patient Position: Sitting, Cuff Size: Large)   Pulse 79   Temp 98.6 F (37 C)   Resp 20    Wt 193 lb 4 oz (87.7 kg)   SpO2 95%   BMI 34.23 kg/m  Gen: Afebrile. No acute distress. Nontoxic in appearance, well-developed, well-nourished, Caucasian female. HENT: AT. [place]. Bilateral TM visualized and normal in appearance. MMM. Bilateral nares without erythema or swelling. Throat without erythema or exudates.  Eyes:Pupils Equal Round Reactive to light, Extraocular movements intact,  Conjunctiva without redness, discharge or icterus. CV: RRR , no  bilateral lower extremity edema Chest: CTAB, no wheeze or crackles Skin: No rashes, purpura or petechiae. Skin intact, dry, warm and well-perfused. Neuro: Unchanged gait. PERLA. EOMi. Alert. Oriented x3. Cranial nerves II through XII intact. Muscle strength 5/5 upper and lower extremity. DTRs equal bilaterally. Psych: Normal affect, dress and demeanor. Normal speech. Normal thought content and judgment. Mild cognitive deficit.      Assessment & Plan:  [name] is a 82 y.o. female present for follow up on chronic medical conditions. HYPERTENSION, BENIGN/dyspnea/asthma - Discussed with patient ideal treatment would be the pressure below 140/90.  - Patient to continue to monitor her blood pressure home, if consistently above the above parameters she is to make an appointment to be seen sooner. - Continue low-salt diet. Elevate legs above heart when able. Patient declined compression stockings. - Increased activity as tolerable. - Consider fish oil supplementation if can tolerate - her BP is not as elevated as one would expect considering she has not bee taking her AZOR (10-40). Discussed in detail with daughter, since her mother is fixated that amlodipine caused her to be crazy (despite use for years but she knew it as AZOR) and blood pressure is not as elevated, would try olmesartan alone 20 mg.  - pt is to restart Symbicort. Prescribed for her today. She is unable to tolerate Advair (formulary) secondary to mouth sores.   Depression with  anxiety: Patient has been stable on 60 mg of Prozac daily for a few years.  - refills today.  - consider neuropsych referral.   Follow-up 3-4 weeks on HTN and Dyspnea  > 25 minutes spent with patient, >50% of time spent face to face counseling patient and coordinating care.   Electronically Signed by: Howard Pouch, DO [facility] [place]

## 2015-12-13 ENCOUNTER — Telehealth: Payer: Self-pay | Admitting: Family Medicine

## 2015-12-13 MED ORDER — BUDESONIDE-FORMOTEROL FUMARATE 80-4.5 MCG/ACT IN AERO: 2.0000 | INHALATION_SPRAY | Freq: Two times a day (BID) | RESPIRATORY_TRACT | 5 refills | Status: DC

## 2015-12-13 MED ORDER — OLMESARTAN MEDOXOMIL 20 MG PO TABS: 20.0000 mg | ORAL_TABLET | Freq: Every day | ORAL | 0 refills | Status: DC

## 2015-12-13 NOTE — Telephone Encounter (Signed)
Discussed medication management with daughter via phone.  - called in Symbicort to start (pt can not tolerate advair).  - omelsartan 20 mg, DC AZOR

## 2016-01-04 DIAGNOSIS — M316 Other giant cell arteritis: Secondary | ICD-10-CM | POA: Diagnosis not present

## 2016-01-04 DIAGNOSIS — Z79899 Other long term (current) drug therapy: Secondary | ICD-10-CM | POA: Diagnosis not present

## 2016-01-09 ENCOUNTER — Encounter: Payer: Self-pay | Admitting: Family Medicine

## 2016-01-09 ENCOUNTER — Ambulatory Visit (INDEPENDENT_AMBULATORY_CARE_PROVIDER_SITE_OTHER): Payer: Medicare Other | Admitting: Family Medicine

## 2016-01-09 VITALS — BP 136/73 | HR 71 | Temp 98.2°F | Resp 20 | Ht 63.0 in | Wt 196.5 lb

## 2016-01-09 DIAGNOSIS — R0600 Dyspnea, unspecified: Secondary | ICD-10-CM

## 2016-01-09 DIAGNOSIS — J4540 Moderate persistent asthma, uncomplicated: Secondary | ICD-10-CM

## 2016-01-09 DIAGNOSIS — I10 Essential (primary) hypertension: Secondary | ICD-10-CM | POA: Diagnosis not present

## 2016-01-09 NOTE — Patient Instructions (Signed)
I am so glad you are feeling better.  Continue your medications. Try to increase exercise now that you are breathing better. I believe as you are able to increase your exercise and the prednisone is decreased your blood pressure will be more ideal.

## 2016-01-09 NOTE — Progress Notes (Addendum)
Subjective:    Patient ID: [name], female    DOB: [birth year], 82 y.o.   MRN: [id number]   Patient Care Team    Relationship Specialty Notifications Start End  Ma Hillock, DO PCP - General Family Medicine  03/16/15   Hermelinda Medicus, MD  Internal Medicine  07/11/15   Fay Records, MD Consulting Physician Cardiology  07/11/15   Lafayette Dragon, MD (Inactive)  Gastroenterology  07/11/15   Tanda Rockers, MD Consulting Physician Pulmonary Disease  07/11/15     Hypertension     Hypertension/dyspnea of exertion/asthma: She has started the olmesartan 20 mg and doing well on medication. Her blood pressures are averaging at goal to just mildly above goal. She is tapering down on the prednisone from her rheumatologist. She has started the Symbicort and it is working rather well for her. She states she is no longer short of breath, she has been able to walk to the end of driveway and get mail without any difficulty. She is extremely happy about how she is feeling. She is hoping to lose weight, now that she is breathing better. She denies chest pain, shortness of breath, lower extremity edema or dizziness.   Prior note:  Patient is currently prescribed amlodipine/valsartan 10-40 milligrams daily. She states the mail in pharmacy sent her the wrong one and it made her see and hear things. I had asked her daughter to check the label when she went home and it was the correct medicine, just generic instead of being labeled AZOR. She states she has not taken this medicine in a few weeks. She also stopped using her statin again, and does not desire to restart. She does take a fish oil supplement on occassions.  She Closely monitor the salt content in her diet. She takes a daily baby aspirin.  She denies any chest pain, visual changes or lower extremity edema. She does admit to increase in dyspnea on exertion. She reports she easily fatigues adnd feels like she just cant breath. Per electronic medical  review, it appears patient had a stress test and a nuclear scan through Dr. Alan Ripper office 623/2016.  She has a history of coronary artery disease and abnormal EKG. Risk factors include hypertension, hyperlipidemia, former smoker. That time she was having chest pain, dyspnea on exertion and shortness of breath. Study 08/11/2014 was with normal EKG, no ST segment deviation stress. No arrhythmias. She did report chest discomfort and shortness of breath during the stress test. 2-D echocardiogram without contrast 07/04/2014 with an ejection fraction of 55-60%.  Grade 1 diastolic dysfunction. Mild aortic valve regurg. Trace mitral regurg and tricuspid regurg. She has also seen pulmonology 02/2014, she has been diagnosed with moderate persistent asthma 11/30/2012. Pt had been on advair but had mouth irritation and discontinued. She was then started on Dulera 100 and symbicort 80 mg 2 puffs BID. She is not currently reporting use of these meds.       Past Medical History:  Diagnosis Date  . Abdominal bloating   . Abnormal LFTs (liver function tests)   . Anxiety disorder   . Arthritis   . Asthma   . Depression   . Diverticulosis   . Edema   . Fatty liver   . Fibromyalgia   . GERD (gastroesophageal reflux disease)   . Giant cell arteritis (HCC)    No biopsy secondary to steroid use  . Hx of adenomatous colonic polyps   . Hyperlipidemia   . Hypertension   .  IBS (irritable bowel syndrome)   . Lumbar radiculitis   . Peptic ulcer disease   . Skin cancer    shoulder, leg, right eye (? squamous by resection)  . Thoracic radiculitis   . Thyroid nodule    "removed"  . Urinary incontinence   . Vertigo    Allergies  Allergen Reactions  . Advair Diskus [Fluticasone-Salmeterol] Other (See Comments)    Mouth sores  . Atorvastatin Other (See Comments)    Severe headache  . Statins   . Sulfonamide Derivatives Rash   Social History  Substance Use Topics  . Smoking status: Never Smoker  . Smokeless  tobacco: Never Used  . Alcohol use No     Review of Systems Negative, with the exception of above mentioned in HPI     Objective:   Physical Exam There were no vitals taken for this visit. Gen: Afebrile. No acute distress. Nontoxic in appearance, well-developed, well-nourished, Caucasian female.happy and smiling today. Speaking full sentences without difficulty.  HENT: AT. [place]. Bilateral TM visualized and normal in appearance. MMM.  Eyes:Pupils Equal Round Reactive to light, Extraocular movements intact,  Conjunctiva without redness, discharge or icterus. CV: RRR , no  bilateral lower extremity edema Chest: CTAB, no wheeze or crackles Skin: No rashes, purpura or petechiae. Skin intact, dry, warm and well-perfused. Neuro:PERLA. EOMi. Alert. Oriented x3.  Psych: Normal affect, dress and demeanor. Normal speech. Normal thought content and judgment.     Assessment & Plan:  [name] is a 82 y.o. female present for follow up on chronic medical conditions. HYPERTENSION, BENIGN/dyspnea/asthma - Discussed with patient ideal treatment would be the pressure below 140/90.  - Continue low-salt diet. - Continue  olmesartan 20 mg [facility] AZOR), now that she is breathing better, she is able to walk more and hopefull the activity will allow her to lose some weight. She is also starting to taper down on the steroid, which will also lower BP. She is just mildly above goal today, and do not believe changes should be made as of yet. If elevated again or consistently above goal at home, then will increase.  -Symbicort working very well. Refills prescribed.  - F/U 3-6 months on chronic medical conditions.   Electronically Signed by: Howard Pouch, DO [place] [facility] [place]

## 2016-01-15 ENCOUNTER — Telehealth: Payer: Self-pay | Admitting: Family Medicine

## 2016-01-15 ENCOUNTER — Other Ambulatory Visit: Payer: Self-pay | Admitting: *Deleted

## 2016-01-15 DIAGNOSIS — I10 Essential (primary) hypertension: Secondary | ICD-10-CM

## 2016-01-15 MED ORDER — MAGIC MOUTHWASH: 5.0000 mL | Freq: Two times a day (BID) | ORAL | 0 refills | Status: DC | PRN

## 2016-01-15 MED ORDER — OLMESARTAN MEDOXOMIL 20 MG PO TABS: 20.0000 mg | ORAL_TABLET | Freq: Every day | ORAL | 1 refills | Status: DC

## 2016-01-15 NOTE — Telephone Encounter (Signed)
Refills for 90 days on BP med and magic mouth wash ordered.

## 2016-01-15 NOTE — Telephone Encounter (Signed)
Patient daughter requesting refills on olmesartan and magic mouth wash. Please advise.

## 2016-01-22 DIAGNOSIS — C44519 Basal cell carcinoma of skin of other part of trunk: Secondary | ICD-10-CM | POA: Diagnosis not present

## 2016-01-22 DIAGNOSIS — L900 Lichen sclerosus et atrophicus: Secondary | ICD-10-CM | POA: Diagnosis not present

## 2016-01-22 DIAGNOSIS — C4441 Basal cell carcinoma of skin of scalp and neck: Secondary | ICD-10-CM | POA: Diagnosis not present

## 2016-02-27 ENCOUNTER — Ambulatory Visit (INDEPENDENT_AMBULATORY_CARE_PROVIDER_SITE_OTHER): Payer: Medicare Other | Admitting: Family Medicine

## 2016-02-27 ENCOUNTER — Encounter: Payer: Self-pay | Admitting: Family Medicine

## 2016-02-27 VITALS — BP 157/71 | HR 79 | Resp 20 | Wt 191.0 lb

## 2016-02-27 DIAGNOSIS — I10 Essential (primary) hypertension: Secondary | ICD-10-CM

## 2016-02-27 DIAGNOSIS — F418 Other specified anxiety disorders: Secondary | ICD-10-CM | POA: Diagnosis not present

## 2016-02-27 MED ORDER — ADULT BLOOD PRESSURE CUFF LG KIT: PACK | 0 refills | Status: DC

## 2016-02-27 NOTE — Progress Notes (Signed)
[name] , 28-Mar-1929, 82 y.o., female MRN: [id number] Patient Care Team    Relationship Specialty Notifications Start End  Sally Hillock, DO PCP - General Family Medicine  03/16/15   Sally Medicus, MD  Internal Medicine  07/11/15   Sally Records, MD Consulting Physician Cardiology  07/11/15   Sally Dragon, MD (Inactive)  Gastroenterology  07/11/15   Sally Rockers, MD Consulting Physician Pulmonary Disease  07/11/15     CC: elevated BP Subjective:   Pt presents to OV with reports of elevated BP. She states she took her BP a few times over the last few days and the lowest it had been was 150's and as high as [id number] systolic. She reports an increase in her arthritic pain and therefore needed to have her prednisone increased. She is now taking 10 mg daily. She is having increased anxiety as well. She reports she takes her medication at night for her BP. She brings in her BP cuffs to have them checked as well.    Allergies  Allergen Reactions  . Advair Diskus [Fluticasone-Salmeterol] Other (See Comments)    Mouth sores  . Atorvastatin Other (See Comments)    Severe headache  . Statins   . Sulfonamide Derivatives Rash   Social History  Substance Use Topics  . Smoking status: Never Smoker  . Smokeless tobacco: Never Used  . Alcohol use No   Past Medical History:  Diagnosis Date  . Abdominal bloating   . Abnormal LFTs (liver function tests)   . Anxiety disorder   . Arthritis   . Asthma   . Depression   . Diverticulosis   . Edema   . Fatty liver   . Fibromyalgia   . GERD (gastroesophageal reflux disease)   . Giant cell arteritis (HCC)    No biopsy secondary to steroid use  . Hx of adenomatous colonic polyps   . Hyperlipidemia   . Hypertension   . IBS (irritable bowel syndrome)   . Lumbar radiculitis   . Peptic ulcer disease   . Skin cancer    shoulder, leg, right eye (? squamous by resection)  . Thoracic radiculitis   . Thyroid nodule    "removed"  . Urinary  incontinence   . Vertigo    Past Surgical History:  Procedure Laterality Date  . ABDOMINAL HYSTERECTOMY    . BACK SURGERY  2005   lower back  . EYE SURGERY Right 2006  . LIVER BIOPSY     fatty liver  . NASAL SEPTUM SURGERY    . right knee surgery  2006   arthroscopy  . right leg skin cancer surgery  2014  . right shoulder skin cancer excision  2012  . THYROIDECTOMY     Family History  Problem Relation Age of Onset  . Colon polyps Maternal Aunt   . Diabetes Father   . Heart disease Father   . Heart disease Mother   . Lung disease Mother   . COPD Mother   . Lung cancer Brother   . Cancer Brother     lung  . COPD Brother   . Stroke Brother   . Lung cancer Sister   . COPD Sister   . Kidney disease      niece-mat side  . Arthritis    . Diabetes Son   . Colon cancer Neg Hx      Medication List       Accurate as of 02/27/16  1:19  PM. Always use your most recent med list.          albuterol 108 (90 Base) MCG/ACT inhaler Commonly known as:  PROAIR HFA 2 puffs up to every 4 hours if can't catch your breath   alendronate 70 MG tablet Commonly known as:  FOSAMAX Take 70 mg by mouth once a week. Take with a full glass of water on an empty stomach.   aspirin 81 MG tablet Take 81 mg by mouth daily.   azelastine 0.1 % nasal spray Commonly known as:  ASTELIN Place 2 sprays into both nostrils at bedtime as needed. Use in each nostril as directed   budesonide-formoterol 80-4.5 MCG/ACT inhaler Commonly known as:  SYMBICORT Inhale 2 puffs into the lungs 2 (two) times daily.   CHLOR-TRIMETON 4 MG tablet Generic drug:  chlorpheniramine 2 tabs by mouth at bedtime.  May add 1 every 4 hours as needed for drippy nose, drainage, throat clearing.   diazepam 5 MG tablet Commonly known as:  VALIUM Take 1/2 tablet by mouth twice daily as needed for back spasms   esomeprazole 40 MG capsule Commonly known as:  NEXIUM Take 40 mg by mouth daily.   famotidine 20 MG  tablet Commonly known as:  PEPCID Take 20 mg by mouth at bedtime.   ferrous sulfate 325 (65 FE) MG tablet Take 1 tablet (325 mg total) by mouth 3 (three) times daily with meals.   FLUoxetine 20 MG capsule Commonly known as:  PROZAC Take 3 capsules (60 mg total) by mouth daily.   fluticasone 50 MCG/ACT nasal spray Commonly known as:  FLONASE Place 2 sprays into both nostrils daily.   folic acid 1 MG tablet Commonly known as:  FOLVITE Take 1 mg by mouth daily.   magic mouthwash Soln Take 5 mLs by mouth 2 (two) times daily as needed for mouth pain.   methotrexate 2.5 MG tablet Commonly known as:  RHEUMATREX Take 10 mg by mouth once a week. Caution:Chemotherapy. Protect from light.   olmesartan 20 MG tablet Commonly known as:  BENICAR Take 1 tablet (20 mg total) by mouth daily.   oxycodone 5 MG capsule Commonly known as:  OXY-IR Take 1/2-1 tablet by mouth every 8 hours as needed for severe back pain   predniSONE 5 MG tablet Commonly known as:  DELTASONE   predniSONE 1 MG tablet Commonly known as:  DELTASONE   rOPINIRole 0.5 MG tablet Commonly known as:  REQUIP       No results found for this or any previous visit (from the past 24 hour(s)). No results found.   ROS: Negative, with the exception of above mentioned in HPI   Objective:  BP (!) 157/71 (BP Location: Right Arm, Patient Position: Sitting, Cuff Size: Large)   Pulse 79   Resp 20   Wt 191 lb (86.6 kg)   SpO2 95%   BMI 33.83 kg/m  Body mass index is 33.83 kg/m. Gen: Afebrile. No acute distress. Nontoxic in appearance, well developed, well nourished.  HENT: AT. [place].MMM, Eyes:Pupils Equal Round Reactive to light, Extraocular movements intact,  Conjunctiva without redness, discharge or icterus. CV: RRR  Chest: CTAB, no wheeze or crackles.  Neuro: Normal gait. PERLA. EOMi. Alert. Oriented x3  Psych: Normal affect, dress and demeanor. Normal speech. Normal thought content and  judgment.  Assessment/Plan: [name] is a 82 y.o. female present for acute OV for  Depression with anxiety Hypertension:  - discussed with pt and her daughter today her BP  is mildly elevated above goal, however her cuff is quite a bit off from cuff reading in the OV (compared cuffs).  - Low salt diet.  - we had recently decreased her BP medication, when she was able to go down on her steroids. Her elevated BP today is likely from pain and her increase in steroids again.  - she is also having more anxiety, again possibly also from increase in steroids.  - New prescription for cuff (large to XL) provided today. - pt to start taking BP medications in the morning.  - restart 2.5 mg valium if feeling anxious. Caution on sedation.   - pt to monitor BP at least 2 hours after medication and if routinely above 140/90 with above changes she will need to be seen, otherwise continue to follow up at schedule visit.  electronically signed by:  Howard Pouch, DO  [place]

## 2016-02-27 NOTE — Patient Instructions (Signed)
We have printed a script for a new BP cuff. It needs to be an adult large cuff.  Start valium 1/2 tab in the morning.  Start BP medication in the morning.  Take BP with new cuff at least 2 hours after medications, if above 140/90 we will need you to call and we will need to increase your BP medications.   As your prednisone or pain increases, it can increase your anxiety and your BP. Keep this in mind if you continue to increase your prednisone.   Go back to the baby Asprin dose.

## 2016-02-28 ENCOUNTER — Other Ambulatory Visit: Payer: Self-pay | Admitting: *Deleted

## 2016-02-28 MED ORDER — ALBUTEROL SULFATE HFA 108 (90 BASE) MCG/ACT IN AERS: INHALATION_SPRAY | RESPIRATORY_TRACT | 2 refills | Status: DC

## 2016-02-28 NOTE — Telephone Encounter (Signed)
proair refilled.

## 2016-02-29 ENCOUNTER — Other Ambulatory Visit: Payer: Self-pay | Admitting: *Deleted

## 2016-02-29 MED ORDER — BUDESONIDE-FORMOTEROL FUMARATE 80-4.5 MCG/ACT IN AERO: 2.0000 | INHALATION_SPRAY | Freq: Two times a day (BID) | RESPIRATORY_TRACT | 5 refills | Status: DC

## 2016-02-29 NOTE — Telephone Encounter (Signed)
Sent refill on symbacort

## 2016-03-14 ENCOUNTER — Other Ambulatory Visit: Payer: Self-pay | Admitting: *Deleted

## 2016-03-14 MED ORDER — BUDESONIDE-FORMOTEROL FUMARATE 80-4.5 MCG/ACT IN AERO: 2.0000 | INHALATION_SPRAY | Freq: Two times a day (BID) | RESPIRATORY_TRACT | 1 refills | Status: DC

## 2016-03-14 NOTE — Telephone Encounter (Signed)
symbicort refilled

## 2016-03-28 ENCOUNTER — Encounter: Payer: Self-pay | Admitting: Family Medicine

## 2016-03-28 DIAGNOSIS — H04223 Epiphora due to insufficient drainage, bilateral lacrimal glands: Secondary | ICD-10-CM | POA: Diagnosis not present

## 2016-03-28 DIAGNOSIS — H04213 Epiphora due to excess lacrimation, bilateral lacrimal glands: Secondary | ICD-10-CM | POA: Diagnosis not present

## 2016-03-28 DIAGNOSIS — Z961 Presence of intraocular lens: Secondary | ICD-10-CM | POA: Diagnosis not present

## 2016-03-28 DIAGNOSIS — H35033 Hypertensive retinopathy, bilateral: Secondary | ICD-10-CM | POA: Diagnosis not present

## 2016-03-29 ENCOUNTER — Encounter: Payer: Self-pay | Admitting: *Deleted

## 2016-04-04 DIAGNOSIS — Z79899 Other long term (current) drug therapy: Secondary | ICD-10-CM | POA: Diagnosis not present

## 2016-04-04 DIAGNOSIS — M316 Other giant cell arteritis: Secondary | ICD-10-CM | POA: Diagnosis not present

## 2016-04-10 ENCOUNTER — Other Ambulatory Visit: Payer: Self-pay | Admitting: Family Medicine

## 2016-05-23 ENCOUNTER — Other Ambulatory Visit: Payer: Self-pay | Admitting: *Deleted

## 2016-05-23 DIAGNOSIS — I10 Essential (primary) hypertension: Secondary | ICD-10-CM

## 2016-05-23 MED ORDER — OLMESARTAN MEDOXOMIL 20 MG PO TABS: 20.0000 mg | ORAL_TABLET | Freq: Every day | ORAL | 0 refills | Status: DC

## 2016-06-06 ENCOUNTER — Other Ambulatory Visit: Payer: Self-pay | Admitting: Family Medicine

## 2016-06-12 ENCOUNTER — Telehealth: Payer: Self-pay | Admitting: Family Medicine

## 2016-06-12 NOTE — Telephone Encounter (Signed)
Patient's daughter [name] called to inquire about medication refill for patient for FLUoxetine (PROZAC) 20 MG capsule. I advised her that the order was placed 06/06/16 to [place] [place]. Patient's daughter verbally expressed understanding.

## 2016-06-24 ENCOUNTER — Telehealth (INDEPENDENT_AMBULATORY_CARE_PROVIDER_SITE_OTHER): Payer: Self-pay | Admitting: Physical Medicine and Rehabilitation

## 2016-06-24 NOTE — Telephone Encounter (Signed)
Scheduled for 07/03/16 at 0900. Added to waitlist. Advised to keep appointment with Dr. Louanne Skye.

## 2016-06-24 NOTE — Telephone Encounter (Signed)
Patient states she has had several falls in the last few months, she has been in a lot of pain the last few days, severe pain since Saturday. She is on Dr.Nitka's cancellation  list for next available appt but as of now her appt is in may. She is requesting to see Dr.Newton in the mean time.  cb# [phone]

## 2016-06-24 NOTE — Telephone Encounter (Signed)
Ok to ov next avail but she will need to keep [place] appointment and no shots in back would be done that day, last seen by me 2013 by Louanne Skye 2014 prior L3-S1 fusion and l2-3 laminectomy

## 2016-06-24 NOTE — Telephone Encounter (Signed)
Please advise. Ok to schedule OV with you vs Dr. Louanne Skye? Has been sen by you before- last time in 2013.

## 2016-06-26 ENCOUNTER — Ambulatory Visit (INDEPENDENT_AMBULATORY_CARE_PROVIDER_SITE_OTHER): Payer: Medicare Other | Admitting: Specialist

## 2016-06-26 ENCOUNTER — Ambulatory Visit (INDEPENDENT_AMBULATORY_CARE_PROVIDER_SITE_OTHER): Payer: Self-pay

## 2016-06-26 ENCOUNTER — Encounter (INDEPENDENT_AMBULATORY_CARE_PROVIDER_SITE_OTHER): Payer: Self-pay | Admitting: Specialist

## 2016-06-26 VITALS — BP 152/73 | HR 75 | Ht 63.5 in | Wt 198.0 lb

## 2016-06-26 DIAGNOSIS — M1711 Unilateral primary osteoarthritis, right knee: Secondary | ICD-10-CM

## 2016-06-26 DIAGNOSIS — M25551 Pain in right hip: Secondary | ICD-10-CM | POA: Diagnosis not present

## 2016-06-26 DIAGNOSIS — M21371 Foot drop, right foot: Secondary | ICD-10-CM

## 2016-06-26 DIAGNOSIS — M545 Low back pain: Secondary | ICD-10-CM

## 2016-06-26 DIAGNOSIS — M5441 Lumbago with sciatica, right side: Secondary | ICD-10-CM | POA: Diagnosis not present

## 2016-06-26 MED ORDER — HYDROCODONE-ACETAMINOPHEN 5-325 MG PO TABS: 1.0000 | ORAL_TABLET | ORAL | 0 refills | Status: DC | PRN

## 2016-06-26 NOTE — Patient Instructions (Addendum)
  Right hip pain and right sacral pain. This may be a sacral insufficiency fracture or may be due to nerve irritation in the lumbar spine. Ice the hip 2-3 times a day 15-20 mins at a time. Use a walker 1/2 weight bearing on the right legCT scan of the pelvis to assess for any sign of right sacral insufficiency fracture..  Obtain right leg AFO to prevent catching of the right foot with walking. See your primary care MD to evaluate fall out spells.

## 2016-06-26 NOTE — Addendum Note (Signed)
Addended by: Basil Dess on: 06/26/2016 12:12 PM   Modules accepted: Orders

## 2016-06-26 NOTE — Progress Notes (Addendum)
Office Visit Note   Patient: [name]           Date of Birth: Dec 16, 1929           MRN: [id number] Visit Date: 06/26/2016              Requested by: Ma Hillock, DO [address] [place], [place] [address] PCP: Howard Pouch, DO   Assessment & Plan: Visit Diagnoses:  1. Low back pain, unspecified back pain laterality, unspecified chronicity, with sciatica presence unspecified     Plan:Right hip pain and right sacral pain. This may be a sacral insufficiency fracture or may be due to nerve irritation in the lumbar spine. Ice the hip 2-3 times a day 15-20 mins at a time. Use a walker 1/2 weight bearing on the right leg. CT scan of the pelvis to assess for any sign of right sacral insufficiency fracture. Appointment with Dr. Ernestina Patches to have ESI scheduled for 07/03/2016 at 9 AMObtain right leg AFO to prevent catching of the right foot with walking. See your primary care MD to evaluate fall out spells.     Follow-Up Instructions: No Follow-up on file.   Orders:  Orders Placed This Encounter  Procedures  . XR Lumbar Spine 2-3 Views   No orders of the defined types were placed in this encounter.     Procedures: No procedures performed   Clinical Data: No additional findings.   Subjective: Chief Complaint  Patient presents with  . Lower Back - Pain    83 year old female, has fallen 3 times in the past one month with the right leg giving away. Last fall with severe worsening of back and right buttock pain and pain into the right pelvis with pain with weight bearing on the right leg. She has been staying with her daughter for the last week. Since the last shot by Dr. Ernestina Patches. No trouble until the most recent fall. Fell at home 2 weeks ago in the AM getting out of bed and with standing next thing she knew she was holding herself up on the side of the bed. Every time she would move she would feel pain in the right leg.  No bowel or bladder difficuties that are acute, does have  some stress incontinence.      Review of Systems  HENT: Positive for sinus pain, sinus pressure and sneezing.   Eyes: Positive for discharge and redness.  Respiratory: Positive for shortness of breath and wheezing.   Cardiovascular: Negative.   Gastrointestinal: Negative.   Endocrine: Negative.   Genitourinary: Negative.   Musculoskeletal: Positive for back pain, gait problem and myalgias.  Skin: Negative.   Allergic/Immunologic: Negative.   Neurological: Positive for weakness and numbness.  Hematological: Negative.   Psychiatric/Behavioral: Negative.      Objective: Vital Signs: BP (!) 152/73 (BP Location: Left Arm, Patient Position: Sitting)   Pulse 75   Ht 5' 3.5" (1.613 m)   Wt 198 lb (89.8 kg)   BMI 34.52 kg/m   Physical Exam  Constitutional: She is oriented to person, place, and time. She appears well-developed and well-nourished.  HENT:  Head: Normocephalic and atraumatic.  Eyes: EOM are normal. Pupils are equal, round, and reactive to light.  Neck: Normal range of motion. Neck supple.  Pulmonary/Chest: Effort normal and breath sounds normal.  Abdominal: Soft. Bowel sounds are normal.  Neurological: She is alert and oriented to person, place, and time.  Skin: Skin is warm and dry.  Psychiatric: She has a normal mood and affect. Her behavior is normal. Judgment and thought content normal.    Right Hip Exam   Tenderness  The patient is experiencing tenderness in the greater trochanter and posterior.  Range of Motion  Extension: normal  Flexion: normal  Internal Rotation: normal  External Rotation: normal  Abduction: normal  Adduction: normal   Muscle Strength  Abduction: 5/5  Adduction: 5/5  Flexion: 5/5   Tests  FABER: negative Ober: negative  Other  Erythema: absent Scars: absent Sensation: normal   Back Exam   Tenderness  The patient is experiencing tenderness in the lumbar.  Range of Motion  Extension: abnormal  Flexion: abnormal    Lateral Bend Right: abnormal  Lateral Bend Left: abnormal  Rotation Right: abnormal  Rotation Left: abnormal   Muscle Strength  Right Quadriceps:  5/5  Left Quadriceps:  5/5  Right Hamstrings:  5/5  Left Hamstrings:  5/5   Tests  Straight leg raise right: positive Straight leg raise left: negative  Reflexes  Patellar: normal Achilles: normal Biceps: normal Babinski's sign: normal   Other  Toe Walk: abnormal Heel Walk: abnormal Gait: drop-foot  Erythema: no back redness Scars: absent  Comments:  Right foot DF weakness, right EHL weakness.       Specialty Comments:  No specialty comments available.  Imaging: No results found.   PMFS History: Patient Active Problem List   Diagnosis Date Noted  . Hypertensive retinopathy of both eyes 09/07/2015  . Estrogen deficiency 07/11/2015  . Medicare annual wellness visit, subsequent 07/11/2015  . Memory loss 07/11/2015  . Long-term use of high-risk medication 07/11/2015  . Maxillary sinusitis, acute 06/05/2015  . Acute stress reaction 06/05/2015  . Osteopenia 04/11/2015  . Health maintenance examination 04/11/2015  . Depression with anxiety 04/11/2015  . Narcotic drug use 03/28/2015  . Methotrexate, long term, current use 03/28/2015  . Giant cell arteritis ([place]) 03/28/2015  . Polymyalgia rheumatica ([place]) 03/16/2015  . Chronic narcotic use 03/16/2015  . Upper airway cough syndrome 01/04/2014  . Anemia, iron deficiency 07/14/2013  . Dyspnea 07/13/2013  . Asthma, moderate persistent 10/15/2012  . Hyperlipidemia 12/27/2008  . HYPERTENSION, BENIGN 12/27/2008   Past Medical History:  Diagnosis Date  . Abdominal bloating   . Abnormal LFTs (liver function tests)   . Anxiety disorder   . Arthritis   . Asthma   . Depression   . Diverticulosis   . Edema   . Fatty liver   . Fibromyalgia   . GERD (gastroesophageal reflux disease)   . Giant cell arteritis ([facility])    No biopsy secondary to steroid use  . Hx of  adenomatous colonic polyps   . Hyperlipidemia   . Hypertension   . IBS (irritable bowel syndrome)   . Lumbar radiculitis   . Peptic ulcer disease   . Skin cancer    shoulder, leg, right eye (? squamous by resection)  . Thoracic radiculitis   . Thyroid nodule    "removed"  . Urinary incontinence   . Vertigo     Family History  Problem Relation Age of Onset  . Colon polyps Maternal Aunt   . Diabetes Father   . Heart disease Father   . Heart disease Mother   . Lung disease Mother   . COPD Mother   . Lung cancer Brother   . Cancer Brother     lung  . COPD Brother   . Stroke Brother   . Lung cancer Sister   .  COPD Sister   . Kidney disease      niece-mat side  . Arthritis    . Diabetes Son   . Colon cancer Neg Hx     Past Surgical History:  Procedure Laterality Date  . ABDOMINAL HYSTERECTOMY    . BACK SURGERY  2005   lower back  . EYE SURGERY Right 2006  . LIVER BIOPSY     fatty liver  . NASAL SEPTUM SURGERY    . right knee surgery  2006   arthroscopy  . right leg skin cancer surgery  2014  . right shoulder skin cancer excision  2012  . THYROIDECTOMY     Social History   Occupational History  . Retired Water engineer    Social History Main Topics  . Smoking status: Never Smoker  . Smokeless tobacco: Never Used  . Alcohol use No  . Drug use: No  . Sexual activity: No

## 2016-06-27 ENCOUNTER — Telehealth (INDEPENDENT_AMBULATORY_CARE_PROVIDER_SITE_OTHER): Payer: Self-pay | Admitting: Physical Medicine and Rehabilitation

## 2016-06-27 NOTE — Telephone Encounter (Signed)
[name] is scheduled with Dr. Ernestina Patches next week for an OV ---does she need to keep that appt or should that appt be cancelled?  I see you have ordered a CT scan.--- Can you please advise?

## 2016-06-28 ENCOUNTER — Ambulatory Visit
Admission: RE | Admit: 2016-06-28 | Discharge: 2016-06-28 | Disposition: A | Discharge status: Home / Self Care | Payer: Medicare Other | Source: Ambulatory Visit | Attending: Specialist | Admitting: Specialist

## 2016-06-28 DIAGNOSIS — M25551 Pain in right hip: Secondary | ICD-10-CM | POA: Diagnosis not present

## 2016-06-28 DIAGNOSIS — M545 Low back pain: Secondary | ICD-10-CM

## 2016-06-28 NOTE — Telephone Encounter (Signed)
Was discussed with patient, I recommend she keep the appointment since injection worked for years previously, hopefully the CT scan will be done before then and we can cancel if it shows a concern else where. jen

## 2016-07-01 NOTE — Telephone Encounter (Signed)
Please see message below

## 2016-07-03 ENCOUNTER — Encounter (INDEPENDENT_AMBULATORY_CARE_PROVIDER_SITE_OTHER): Payer: Self-pay | Admitting: Physical Medicine and Rehabilitation

## 2016-07-03 ENCOUNTER — Ambulatory Visit (INDEPENDENT_AMBULATORY_CARE_PROVIDER_SITE_OTHER): Payer: Medicare Other | Admitting: Physical Medicine and Rehabilitation

## 2016-07-03 ENCOUNTER — Ambulatory Visit (INDEPENDENT_AMBULATORY_CARE_PROVIDER_SITE_OTHER): Payer: Medicare Other

## 2016-07-03 VITALS — BP 172/77 | HR 72 | Temp 98.3°F

## 2016-07-03 DIAGNOSIS — M545 Low back pain: Secondary | ICD-10-CM | POA: Diagnosis not present

## 2016-07-03 DIAGNOSIS — G8929 Other chronic pain: Secondary | ICD-10-CM

## 2016-07-03 DIAGNOSIS — M961 Postlaminectomy syndrome, not elsewhere classified: Secondary | ICD-10-CM | POA: Diagnosis not present

## 2016-07-03 DIAGNOSIS — M25551 Pain in right hip: Secondary | ICD-10-CM

## 2016-07-03 DIAGNOSIS — M5416 Radiculopathy, lumbar region: Secondary | ICD-10-CM

## 2016-07-03 MED ORDER — DEXAMETHASONE SODIUM PHOSPHATE 10 MG/ML IJ SOLN
15.0000 mg | Freq: Once | INTRAMUSCULAR | Status: AC
  Administered 2016-07-03: 15 mg

## 2016-07-03 MED ORDER — LIDOCAINE HCL (PF) 1 % IJ SOLN
0.3300 mL | Freq: Once | INTRAMUSCULAR | Status: AC
  Administered 2016-07-03: 0.3 mL

## 2016-07-03 NOTE — Patient Instructions (Signed)

## 2016-07-03 NOTE — Progress Notes (Signed)
[name] - 83 y.o. female MRN [id number]  Date of birth: Nov 05, 1929  Office Visit Note: Visit Date: 07/03/2016 PCP: Howard Pouch, DO Referred by: Ma Hillock, DO  Subjective: Chief Complaint  Patient presents with  . Lower Back - Pain   HPI: [name] is an 83 year old female who recently saw Dr. Louanne Skye for evaluation and management. She has had lumbar fusion down to the sacrum. She had prior injection on the right side above the fusion in the past with good relief. He requests repeat epidural injection    ROS Otherwise per HPI.  Assessment & Plan: Visit Diagnoses:  1. Chronic right-sided low back pain without sciatica   2. Post laminectomy syndrome   3. Pain in right hip   4. Lumbar radiculopathy     Plan: Findings:  Right L2 transforaminal epidural steroid injection.    Meds & Orders:  Meds ordered this encounter  Medications  . lidocaine (PF) (XYLOCAINE) 1 % injection 0.3 mL  . dexamethasone (DECADRON) injection 15 mg    Orders Placed This Encounter  Procedures  . XR C-ARM NO REPORT  . Epidural Steroid injection    Follow-up: Return if symptoms worsen or fail to improve, for Dr, Louanne Skye.   Procedures: No procedures performed  Lumbosacral Transforaminal Epidural Steroid Injection - Infraneural Approach with Fluoroscopic Guidance  Patient: [name]      Date of Birth: [birth year] MRN: [id number] PCP: Howard Pouch, DO      Visit Date: 07/03/2016   Universal Protocol:    Date/Time: 04/19/181:02 PM  Consent Given By: the patient  Position: PRONE   Additional Comments: Vital signs were monitored before and after the procedure. Patient was prepped and draped in the usual sterile fashion. The correct patient, procedure, and site was verified.   Injection Procedure Details:  Procedure Site One Meds Administered:  Meds ordered this encounter  Medications  . lidocaine (PF) (XYLOCAINE) 1 % injection 0.3 mL  . dexamethasone (DECADRON) injection  15 mg      Laterality: Right  Location/Site:  L2-L3  Needle size: 22 G  Needle type: Spinal  Needle Placement: Transforaminal  Findings:  -Contrast Used: 0.5 mL iohexol 180 mg iodine/mL   -Comments: Excellent flow of contrast along the nerve and into the epidural space.  Procedure Details: After squaring off the end-plates of the desired vertebral level to get a true AP view, the C-arm was obliqued to the painful side so that the superior articulating process is positioned about 1/3 the length of the inferior endplate.  The needle was aimed toward the junction of the superior articular process and the transverse process of the inferior vertebrae. The needle's initial entry is in the lower third of the foramen through Kambin's triangle. The soft tissues overlying this target were infiltrated with 2-3 ml. of 1% Lidocaine without Epinephrine.  The spinal needle was then inserted and advanced toward the target using a "trajectory" view along the fluoroscope beam.  Under AP and lateral visualization, the needle was advanced so it did not puncture dura and did not traverse medially beyond the 6 o'clock position of the pedicle. Bi-planar projections were used to confirm position. Aspiration was confirmed to be negative for CSF and/or blood. A 1-2 ml. volume of Isovue-250 was injected and flow of contrast was noted at each level. Radiographs were obtained for documentation purposes.   After attaining the desired flow of contrast documented above, a 0.5 to 1.0 ml test dose of 0.25% Marcaine was injected  into each respective transforaminal space.  The patient was observed for 90 seconds post injection.  After no sensory deficits were reported, and normal lower extremity motor function was noted,   the above injectate was administered so that equal amounts of the injectate were placed at each foramen (level) into the transforaminal epidural space.   Additional Comments:  The patient tolerated the  procedure well Dressing: Band-Aid    Post-procedure details: Patient was observed during the procedure. Post-procedure instructions were reviewed.  Patient left the clinic in stable condition.   Clinical History: No specialty comments available.  She reports that she has never smoked. She has never used smokeless tobacco. No results for input(s): HGBA1C, LABURIC in the last 8760 hours.  Objective:  VS:  HT:    WT:   BMI:     BP:(!) 172/77  HR:72bpm  TEMP:98.3 F (36.8 C)(Oral)  RESP:94 % Physical Exam  Musculoskeletal:  Patient ambulates with a walker with good distal strength.    Ortho Exam Imaging: No results found.  Past Medical/Family/Surgical/Social History: Medications & Allergies reviewed per EMR Patient Active Problem List   Diagnosis Date Noted  . Hypertensive retinopathy of both eyes 09/07/2015  . Estrogen deficiency 07/11/2015  . Medicare annual wellness visit, subsequent 07/11/2015  . Memory loss 07/11/2015  . Long-term use of high-risk medication 07/11/2015  . Maxillary sinusitis, acute 06/05/2015  . Acute stress reaction 06/05/2015  . Osteopenia 04/11/2015  . Health maintenance examination 04/11/2015  . Depression with anxiety 04/11/2015  . Narcotic drug use 03/28/2015  . Methotrexate, long term, current use 03/28/2015  . Giant cell arteritis ([place]) 03/28/2015  . Polymyalgia rheumatica ([place]) 03/16/2015  . Chronic narcotic use 03/16/2015  . Upper airway cough syndrome 01/04/2014  . Anemia, iron deficiency 07/14/2013  . Dyspnea 07/13/2013  . Asthma, moderate persistent 10/15/2012  . Hyperlipidemia 12/27/2008  . HYPERTENSION, BENIGN 12/27/2008   Past Medical History:  Diagnosis Date  . Abdominal bloating   . Abnormal LFTs (liver function tests)   . Anxiety disorder   . Arthritis   . Asthma   . Depression   . Diverticulosis   . Edema   . Fatty liver   . Fibromyalgia   . GERD (gastroesophageal reflux disease)   . Giant cell arteritis ([facility])     No biopsy secondary to steroid use  . Hx of adenomatous colonic polyps   . Hyperlipidemia   . Hypertension   . IBS (irritable bowel syndrome)   . Lumbar radiculitis   . Peptic ulcer disease   . Skin cancer    shoulder, leg, right eye (? squamous by resection)  . Thoracic radiculitis   . Thyroid nodule    "removed"  . Urinary incontinence   . Vertigo    Family History  Problem Relation Age of Onset  . Colon polyps Maternal Aunt   . Diabetes Father   . Heart disease Father   . Heart disease Mother   . Lung disease Mother   . COPD Mother   . Lung cancer Brother   . Cancer Brother     lung  . COPD Brother   . Stroke Brother   . Lung cancer Sister   . COPD Sister   . Kidney disease      niece-mat side  . Arthritis    . Diabetes Son   . Colon cancer Neg Hx    Past Surgical History:  Procedure Laterality Date  . ABDOMINAL HYSTERECTOMY    . BACK SURGERY  2005   lower back  . EYE SURGERY Right 2006  . LIVER BIOPSY     fatty liver  . NASAL SEPTUM SURGERY    . right knee surgery  2006   arthroscopy  . right leg skin cancer surgery  2014  . right shoulder skin cancer excision  2012  . THYROIDECTOMY     Social History   Occupational History  . Retired Water engineer    Social History Main Topics  . Smoking status: Never Smoker  . Smokeless tobacco: Never Used  . Alcohol use No  . Drug use: No  . Sexual activity: No

## 2016-07-04 DIAGNOSIS — M316 Other giant cell arteritis: Secondary | ICD-10-CM | POA: Diagnosis not present

## 2016-07-04 DIAGNOSIS — Z79899 Other long term (current) drug therapy: Secondary | ICD-10-CM | POA: Diagnosis not present

## 2016-07-04 NOTE — Procedures (Signed)
Lumbosacral Transforaminal Epidural Steroid Injection - Infraneural Approach with Fluoroscopic Guidance  Patient: [name]      Date of Birth: 02-25-1930 MRN: [id number] PCP: Howard Pouch, DO      Visit Date: 07/03/2016   Universal Protocol:    Date/Time: 04/19/181:02 PM  Consent Given By: the patient  Position: PRONE   Additional Comments: Vital signs were monitored before and after the procedure. Patient was prepped and draped in the usual sterile fashion. The correct patient, procedure, and site was verified.   Injection Procedure Details:  Procedure Site One Meds Administered:  Meds ordered this encounter  Medications  . lidocaine (PF) (XYLOCAINE) 1 % injection 0.3 mL  . dexamethasone (DECADRON) injection 15 mg      Laterality: Right  Location/Site:  L2-L3  Needle size: 22 G  Needle type: Spinal  Needle Placement: Transforaminal  Findings:  -Contrast Used: 0.5 mL iohexol 180 mg iodine/mL   -Comments: Excellent flow of contrast along the nerve and into the epidural space.  Procedure Details: After squaring off the end-plates of the desired vertebral level to get a true AP view, the C-arm was obliqued to the painful side so that the superior articulating process is positioned about 1/3 the length of the inferior endplate.  The needle was aimed toward the junction of the superior articular process and the transverse process of the inferior vertebrae. The needle's initial entry is in the lower third of the foramen through Kambin's triangle. The soft tissues overlying this target were infiltrated with 2-3 ml. of 1% Lidocaine without Epinephrine.  The spinal needle was then inserted and advanced toward the target using a "trajectory" view along the fluoroscope beam.  Under AP and lateral visualization, the needle was advanced so it did not puncture dura and did not traverse medially beyond the 6 o'clock position of the pedicle. Bi-planar projections were used to  confirm position. Aspiration was confirmed to be negative for CSF and/or blood. A 1-2 ml. volume of Isovue-250 was injected and flow of contrast was noted at each level. Radiographs were obtained for documentation purposes.   After attaining the desired flow of contrast documented above, a 0.5 to 1.0 ml test dose of 0.25% Marcaine was injected into each respective transforaminal space.  The patient was observed for 90 seconds post injection.  After no sensory deficits were reported, and normal lower extremity motor function was noted,   the above injectate was administered so that equal amounts of the injectate were placed at each foramen (level) into the transforaminal epidural space.   Additional Comments:  The patient tolerated the procedure well Dressing: Band-Aid    Post-procedure details: Patient was observed during the procedure. Post-procedure instructions were reviewed.  Patient left the clinic in stable condition.

## 2016-07-12 ENCOUNTER — Ambulatory Visit (INDEPENDENT_AMBULATORY_CARE_PROVIDER_SITE_OTHER): Payer: Medicare Other

## 2016-07-12 ENCOUNTER — Telehealth (INDEPENDENT_AMBULATORY_CARE_PROVIDER_SITE_OTHER): Payer: Self-pay | Admitting: Specialist

## 2016-07-12 VITALS — BP 148/68 | HR 76 | Ht 64.0 in | Wt 198.8 lb

## 2016-07-12 DIAGNOSIS — Z0000 Encounter for general adult medical examination without abnormal findings: Secondary | ICD-10-CM

## 2016-07-12 NOTE — Progress Notes (Signed)
Pre visit review using our clinic review tool, if applicable. No additional management support is needed unless otherwise documented below in the visit note. 

## 2016-07-12 NOTE — Patient Instructions (Addendum)
Make audiology appointment.   Bring a copy of your advance directives to your next office visit.  Continue doing brain stimulating activities (puzzles, reading, adult coloring books, staying active) to keep memory sharp.    Fall Prevention in the Home Falls can cause injuries. They can happen to people of all ages. There are many things you can do to make your home safe and to help prevent falls. What can I do on the outside of my home?  Regularly fix the edges of walkways and driveways and fix any cracks.  Remove anything that might make you trip as you walk through a door, such as a raised step or threshold.  Trim any bushes or trees on the path to your home.  Use bright outdoor lighting.  Clear any walking paths of anything that might make someone trip, such as rocks or tools.  Regularly check to see if handrails are loose or broken. Make sure that both sides of any steps have handrails.  Any raised decks and porches should have guardrails on the edges.  Have any leaves, snow, or ice cleared regularly.  Use sand or salt on walking paths during winter.  Clean up any spills in your garage right away. This includes oil or grease spills. What can I do in the bathroom?  Use night lights.  Install grab bars by the toilet and in the tub and shower. Do not use towel bars as grab bars.  Use non-skid mats or decals in the tub or shower.  If you need to sit down in the shower, use a plastic, non-slip stool.  Keep the floor dry. Clean up any water that spills on the floor as soon as it happens.  Remove soap buildup in the tub or shower regularly.  Attach bath mats securely with double-sided non-slip rug tape.  Do not have throw rugs and other things on the floor that can make you trip. What can I do in the bedroom?  Use night lights.  Make sure that you have a light by your bed that is easy to reach.  Do not use any sheets or blankets that are too big for your bed. They  should not hang down onto the floor.  Have a firm chair that has side arms. You can use this for support while you get dressed.  Do not have throw rugs and other things on the floor that can make you trip. What can I do in the kitchen?  Clean up any spills right away.  Avoid walking on wet floors.  Keep items that you use a lot in easy-to-reach places.  If you need to reach something above you, use a strong step stool that has a grab bar.  Keep electrical cords out of the way.  Do not use floor polish or wax that makes floors slippery. If you must use wax, use non-skid floor wax.  Do not have throw rugs and other things on the floor that can make you trip. What can I do with my stairs?  Do not leave any items on the stairs.  Make sure that there are handrails on both sides of the stairs and use them. Fix handrails that are broken or loose. Make sure that handrails are as long as the stairways.  Check any carpeting to make sure that it is firmly attached to the stairs. Fix any carpet that is loose or worn.  Avoid having throw rugs at the top or bottom of the stairs. If  you do have throw rugs, attach them to the floor with carpet tape.  Make sure that you have a light switch at the top of the stairs and the bottom of the stairs. If you do not have them, ask someone to add them for you. What else can I do to help prevent falls?  Wear shoes that:  Do not have high heels.  Have rubber bottoms.  Are comfortable and fit you well.  Are closed at the toe. Do not wear sandals.  If you use a stepladder:  Make sure that it is fully opened. Do not climb a closed stepladder.  Make sure that both sides of the stepladder are locked into place.  Ask someone to hold it for you, if possible.  Clearly mark and make sure that you can see:  Any grab bars or handrails.  First and last steps.  Where the edge of each step is.  Use tools that help you move around (mobility aids) if  they are needed. These include:  Canes.  Walkers.  Scooters.  Crutches.  Turn on the lights when you go into a dark area. Replace any light bulbs as soon as they burn out.  Set up your furniture so you have a clear path. Avoid moving your furniture around.  If any of your floors are uneven, fix them.  If there are any pets around you, be aware of where they are.  Review your medicines with your doctor. Some medicines can make you feel dizzy. This can increase your chance of falling. Ask your doctor what other things that you can do to help prevent falls. This information is not intended to replace advice given to you by your health care provider. Make sure you discuss any questions you have with your health care provider. Document Released: 12/29/2008 Document Revised: 08/10/2015 Document Reviewed: 04/08/2014 Elsevier Interactive Patient Education  2017 [place] Maintenance, Female Adopting a healthy lifestyle and getting preventive care can go a long way to promote health and wellness. Talk with your health care provider about what schedule of regular examinations is right for you. This is a good chance for you to check in with your provider about disease prevention and staying healthy. In between checkups, there are plenty of things you can do on your own. Experts have done a lot of research about which lifestyle changes and preventive measures are most likely to keep you healthy. Ask your health care provider for more information. Weight and diet Eat a healthy diet  Be sure to include plenty of vegetables, fruits, low-fat dairy products, and lean protein.  Do not eat a lot of foods high in solid fats, added sugars, or salt.  Get regular exercise. This is one of the most important things you can do for your health.  Most adults should exercise for at least 150 minutes each week. The exercise should increase your heart rate and make you sweat (moderate-intensity  exercise).  Most adults should also do strengthening exercises at least twice a week. This is in addition to the moderate-intensity exercise. Maintain a healthy weight  Body mass index (BMI) is a measurement that can be used to identify possible weight problems. It estimates body fat based on height and weight. Your health care provider can help determine your BMI and help you achieve or maintain a healthy weight.  For females 88 years of age and older:  A BMI below 18.5 is considered underweight.  A BMI of 18.5  to 24.9 is normal.  A BMI of 25 to 29.9 is considered overweight.  A BMI of 30 and above is considered obese. Watch levels of cholesterol and blood lipids  You should start having your blood tested for lipids and cholesterol at 81 years of age, then have this test every 5 years.  You may need to have your cholesterol levels checked more often if:  Your lipid or cholesterol levels are high.  You are older than 81 years of age.  You are at high risk for heart disease. Cancer screening Lung Cancer  Lung cancer screening is recommended for adults 89-20 years old who are at high risk for lung cancer because of a history of smoking.  A yearly low-dose CT scan of the lungs is recommended for people who:  Currently smoke.  Have quit within the past 15 years.  Have at least a 30-pack-year history of smoking. A pack year is smoking an average of one pack of cigarettes a day for 1 year.  Yearly screening should continue until it has been 15 years since you quit.  Yearly screening should stop if you develop a health problem that would prevent you from having lung cancer treatment. Breast Cancer  Practice breast self-awareness. This means understanding how your breasts normally appear and feel.  It also means doing regular breast self-exams. Let your health care provider know about any changes, no matter how small.  If you are in your 20s or 30s, you should have a clinical  breast exam (CBE) by a health care provider every 1-3 years as part of a regular health exam.  If you are 59 or older, have a CBE every year. Also consider having a breast X-ray (mammogram) every year.  If you have a family history of breast cancer, talk to your health care provider about genetic screening.  If you are at high risk for breast cancer, talk to your health care provider about having an MRI and a mammogram every year.  Breast cancer gene (BRCA) assessment is recommended for women who have family members with BRCA-related cancers. BRCA-related cancers include:  Breast.  Ovarian.  Tubal.  Peritoneal cancers.  Results of the assessment will determine the need for genetic counseling and BRCA1 and BRCA2 testing. Cervical Cancer  Your health care provider may recommend that you be screened regularly for cancer of the pelvic organs (ovaries, uterus, and vagina). This screening involves a pelvic examination, including checking for microscopic changes to the surface of your cervix (Pap test). You may be encouraged to have this screening done every 3 years, beginning at age 59.  For women ages 35-65, health care providers may recommend pelvic exams and Pap testing every 3 years, or they may recommend the Pap and pelvic exam, combined with testing for human papilloma virus (HPV), every 5 years. Some types of HPV increase your risk of cervical cancer. Testing for HPV may also be done on women of any age with unclear Pap test results.  Other health care providers may not recommend any screening for nonpregnant women who are considered low risk for pelvic cancer and who do not have symptoms. Ask your health care provider if a screening pelvic exam is right for you.  If you have had past treatment for cervical cancer or a condition that could lead to cancer, you need Pap tests and screening for cancer for at least 20 years after your treatment. If Pap tests have been discontinued, your risk  factors (such as  having a new sexual partner) need to be reassessed to determine if screening should resume. Some women have medical problems that increase the chance of getting cervical cancer. In these cases, your health care provider may recommend more frequent screening and Pap tests. Colorectal Cancer  This type of cancer can be detected and often prevented.  Routine colorectal cancer screening usually begins at 81 years of age and continues through 81 years of age.  Your health care provider may recommend screening at an earlier age if you have risk factors for colon cancer.  Your health care provider may also recommend using home test kits to check for hidden blood in the stool.  A small camera at the end of a tube can be used to examine your colon directly (sigmoidoscopy or colonoscopy). This is done to check for the earliest forms of colorectal cancer.  Routine screening usually begins at age 21.  Direct examination of the colon should be repeated every 5-10 years through 81 years of age. However, you may need to be screened more often if early forms of precancerous polyps or small growths are found. Skin Cancer  Check your skin from head to toe regularly.  Tell your health care provider about any new moles or changes in moles, especially if there is a change in a mole's shape or color.  Also tell your health care provider if you have a mole that is larger than the size of a pencil eraser.  Always use sunscreen. Apply sunscreen liberally and repeatedly throughout the day.  Protect yourself by wearing long sleeves, pants, a wide-brimmed hat, and sunglasses whenever you are outside. Heart disease, diabetes, and high blood pressure  High blood pressure causes heart disease and increases the risk of stroke. High blood pressure is more likely to develop in:  People who have blood pressure in the high end of the normal range (130-139/85-89 mm Hg).  People who are overweight or  obese.  People who are African American.  If you are 64-93 years of age, have your blood pressure checked every 3-5 years. If you are 75 years of age or older, have your blood pressure checked every year. You should have your blood pressure measured twice-once when you are at a hospital or clinic, and once when you are not at a hospital or clinic. Record the average of the two measurements. To check your blood pressure when you are not at a hospital or clinic, you can use:  An automated blood pressure machine at a pharmacy.  A home blood pressure monitor.  If you are between 84 years and 37 years old, ask your health care provider if you should take aspirin to prevent strokes.  Have regular diabetes screenings. This involves taking a blood sample to check your fasting blood sugar level.  If you are at a normal weight and have a low risk for diabetes, have this test once every three years after 81 years of age.  If you are overweight and have a high risk for diabetes, consider being tested at a younger age or more often. Preventing infection Hepatitis B  If you have a higher risk for hepatitis B, you should be screened for this virus. You are considered at high risk for hepatitis B if:  You were born in a country where hepatitis B is common. Ask your health care provider which countries are considered high risk.  Your parents were born in a high-risk country, and you have not been immunized against  hepatitis B (hepatitis B vaccine).  You have HIV or AIDS.  You use needles to inject street drugs.  You live with someone who has hepatitis B.  You have had sex with someone who has hepatitis B.  You get hemodialysis treatment.  You take certain medicines for conditions, including cancer, organ transplantation, and autoimmune conditions. Hepatitis C  Blood testing is recommended for:  Everyone born from [birth year] through 1963.  Anyone with known risk factors for hepatitis C. Sexually  transmitted infections (STIs)  You should be screened for sexually transmitted infections (STIs) including gonorrhea and chlamydia if:  You are sexually active and are younger than 81 years of age.  You are older than 81 years of age and your health care provider tells you that you are at risk for this type of infection.  Your sexual activity has changed since you were last screened and you are at an increased risk for chlamydia or gonorrhea. Ask your health care provider if you are at risk.  If you do not have HIV, but are at risk, it may be recommended that you take a prescription medicine daily to prevent HIV infection. This is called pre-exposure prophylaxis (PrEP). You are considered at risk if:  You are sexually active and do not regularly use condoms or know the HIV status of your partner(s).  You take drugs by injection.  You are sexually active with a partner who has HIV. Talk with your health care provider about whether you are at high risk of being infected with HIV. If you choose to begin PrEP, you should first be tested for HIV. You should then be tested every 3 months for as long as you are taking PrEP. Pregnancy  If you are premenopausal and you may become pregnant, ask your health care provider about preconception counseling.  If you may become pregnant, take 400 to 800 micrograms (mcg) of folic acid every day.  If you want to prevent pregnancy, talk to your health care provider about birth control (contraception). Osteoporosis and menopause  Osteoporosis is a disease in which the bones lose minerals and strength with aging. This can result in serious bone fractures. Your risk for osteoporosis can be identified using a bone density scan.  If you are 73 years of age or older, or if you are at risk for osteoporosis and fractures, ask your health care provider if you should be screened.  Ask your health care provider whether you should take a calcium or vitamin D  supplement to lower your risk for osteoporosis.  Menopause may have certain physical symptoms and risks.  Hormone replacement therapy may reduce some of these symptoms and risks. Talk to your health care provider about whether hormone replacement therapy is right for you. Follow these instructions at home:  Schedule regular health, dental, and eye exams.  Stay current with your immunizations.  Do not use any tobacco products including cigarettes, chewing tobacco, or electronic cigarettes.  If you are pregnant, do not drink alcohol.  If you are breastfeeding, limit how much and how often you drink alcohol.  Limit alcohol intake to no more than 1 drink per day for nonpregnant women. One drink equals 12 ounces of beer, 5 ounces of wine, or 1 ounces of hard liquor.  Do not use street drugs.  Do not share needles.  Ask your health care provider for help if you need support or information about quitting drugs.  Tell your health care provider if you often feel depressed.  Tell your health care provider if you have ever been abused or do not feel safe at home. This information is not intended to replace advice given to you by your health care provider. Make sure you discuss any questions you have with your health care provider. Document Released: 09/17/2010 Document Revised: 08/10/2015 Document Reviewed: 12/06/2014 Elsevier Interactive Patient Education  2017 [place]. 08/18.

## 2016-07-12 NOTE — Telephone Encounter (Signed)
06/26/2016 PROGRESS NOTE FAXED TO [employer] TO [facility]

## 2016-07-12 NOTE — Progress Notes (Addendum)
Subjective:   [name] is a 83 y.o. female who presents for Medicare Annual (Subsequent) preventive examination. Accompanied by daughter [name].   Review of Systems:  No ROS.  Medicare Wellness Visit.  Cardiac Risk Factors include: advanced age (>58mn, >>1women);dyslipidemia;family history of premature cardiovascular disease;hypertension;sedentary lifestyle;obesity (BMI >30kg/m2)   Sleep patterns: Sleeps 6-8 hours. Feels rested. Up to void x 1.  Home Safety/Smoke Alarms:  Smoke detectors and security in place.  Living environment; residence and Firearm Safety: Lives with daughter (1 story home) part time and son stays with patient part time (1 story with basement, electric chair to access. Feels safe in homes. Plans to get emergency response button (Freedom Alert).  Seat Belt Safety/Bike Helmet: Wears seat belt.   Counseling:   [facility]08[facility]2016[facility] [place]  Female:   Pap-N/A      Mammo-Last > 5 years. Declines further testing.        Dexa scan-07/21/2015, Osteopenia.         CCS-Colonoscopy 07/02/2013, polyps. No recall d/t age.      Objective:     Vitals: BP (!) 148/68 (BP Location: Right Arm, Patient Position: Sitting, Cuff Size: Normal)   Pulse 76   Ht 5' 4" (1.626 m)   Wt 198 lb 12.8 oz (90.2 kg)   SpO2 95%   BMI 34.12 kg/m   Body mass index is 34.12 kg/m.   Tobacco History  Smoking Status  . Never Smoker  Smokeless Tobacco  . Never Used     Counseling given: Not Answered   Past Medical History:  Diagnosis Date  . Abdominal bloating   . Abnormal LFTs (liver function tests)   . Anxiety disorder   . Arthritis   . Asthma   . Depression   . Diverticulosis   . Edema   . Fatty liver   . Fibromyalgia   . GERD (gastroesophageal reflux disease)   . Giant cell arteritis (HCC)    No biopsy secondary to steroid use  . Hx of adenomatous colonic polyps   .  Hyperlipidemia   . Hypertension   . IBS (irritable bowel syndrome)   . Lumbar radiculitis   . Peptic ulcer disease   . Skin cancer    shoulder, leg, right eye (? squamous by resection)  . Thoracic radiculitis   . Thyroid nodule    "removed"  . Urinary incontinence   . Vertigo    Past Surgical History:  Procedure Laterality Date  . ABDOMINAL HYSTERECTOMY    . BACK SURGERY  2005   lower back  . EYE SURGERY Right 2006  . LIVER BIOPSY     fatty liver  . NASAL SEPTUM SURGERY    . right knee surgery  2006   arthroscopy  . right leg skin cancer surgery  2014  . right shoulder skin cancer excision  2012  . THYROIDECTOMY     Family History  Problem Relation Age of Onset  . Colon polyps Maternal Aunt   . Diabetes Father   . Heart disease Father   . Heart disease Mother   . Lung disease Mother   . COPD Mother   . Lung cancer Brother   . Cancer Brother     lung  . COPD Brother   . Stroke Brother   . Lung cancer Sister   . COPD Sister   . Kidney disease  niece-mat side  . Arthritis    . Diabetes Son   . Arthritis Daughter   . Colon cancer Neg Hx    History  Sexual Activity  . Sexual activity: No    Outpatient Encounter Prescriptions as of 07/12/2016  Medication Sig  . albuterol (PROAIR HFA) 108 (90 Base) MCG/ACT inhaler 2 puffs up to every 4 hours if can't catch your breath  . alendronate (FOSAMAX) 70 MG tablet Take 70 mg by mouth once a week. Take with a full glass of water on an empty stomach.  Marland Kitchen aspirin 81 MG tablet Take 81 mg by mouth daily.    Marland Kitchen azelastine (ASTELIN) 137 MCG/SPRAY nasal spray Place 2 sprays into both nostrils at bedtime as needed. Use in each nostril as directed  . Blood Pressure Monitoring (ADULT BLOOD PRESSURE CUFF LG) KIT Large to XL kit.  . budesonide-formoterol (SYMBICORT) 80-4.5 MCG/ACT inhaler Inhale 2 puffs into the lungs 2 (two) times daily.  . chlorpheniramine (CHLOR-TRIMETON) 4 MG tablet 2 tabs by mouth at bedtime.  May add 1 every  4 hours as needed for drippy nose, drainage, throat clearing.  . diazepam (VALIUM) 5 MG tablet Take 1/2 tablet by mouth twice daily as needed for back spasms   . esomeprazole (NEXIUM) 40 MG capsule Take 40 mg by mouth daily.   . famotidine (PEPCID) 20 MG tablet Take 20 mg by mouth at bedtime.  . ferrous sulfate 325 (65 FE) MG tablet Take 1 tablet (325 mg total) by mouth 3 (three) times daily with meals.  Marland Kitchen FLUoxetine (PROZAC) 20 MG capsule TAKE 4 CAPSULES DAILY  . fluticasone (FLONASE) 50 MCG/ACT nasal spray Place 2 sprays into both nostrils daily.  . folic acid (FOLVITE) 1 MG tablet Take 1 mg by mouth daily.  Marland Kitchen HYDROcodone-acetaminophen (NORCO/VICODIN) 5-325 MG tablet Take 1 tablet by mouth every 4 (four) hours as needed for moderate pain.  . magic mouthwash SOLN Take 5 mLs by mouth 2 (two) times daily as needed for mouth pain.  . methotrexate (RHEUMATREX) 2.5 MG tablet Take 10 mg by mouth once a week. Caution:Chemotherapy. Protect from light.  . olmesartan (BENICAR) 20 MG tablet Take 1 tablet (20 mg total) by mouth daily.  Marland Kitchen oxycodone (OXY-IR) 5 MG capsule Take 1/2-1 tablet by mouth every 8 hours as needed for severe back pain  . predniSONE (DELTASONE) 1 MG tablet   . predniSONE (DELTASONE) 5 MG tablet   . rOPINIRole (REQUIP) 0.5 MG tablet   . ibuprofen (ADVIL,MOTRIN) 400 MG tablet Take 400 mg by mouth as needed.   No facility-administered encounter medications on file as of 07/12/2016.     Activities of Daily Living In your present state of health, do you have any difficulty performing the following activities: 07/12/2016  Hearing? Y  Vision? N  Difficulty concentrating or making decisions? Y  Walking or climbing stairs? Y  Dressing or bathing? Y  Doing errands, shopping? Y  Preparing Food and eating ? Y  Using the Toilet? Y  In the past six months, have you accidently leaked urine? Y  Do you have problems with loss of bowel control? N  Managing your Medications? Y  Managing your  Finances? Y  Housekeeping or managing your Housekeeping? Y  Some recent data might be hidden    Patient Care Team: Ma Hillock, DO as PCP - General (Family Medicine) Hermelinda Medicus, MD (Internal Medicine) Fay Records, MD as Consulting Physician (Cardiology) Lafayette Dragon, MD (Inactive) (Gastroenterology) Christena Deem  Melvyn Novas, MD as Consulting Physician (Pulmonary Disease) Jessy Oto, MD as Consulting Physician (Orthopedic Surgery) Magnus Sinning, MD as Consulting Physician (Physical Medicine and Rehabilitation)    Assessment:    Physical assessment deferred to PCP.  Exercise Activities and Dietary recommendations Current Exercise Habits: The patient does not participate in regular exercise at present (Chair leg exercises. ), Exercise limited by: orthopedic condition(s);Other - see comments (pain)   Diet (meal preparation, eat out, water intake, caffeinated beverages, dairy products, fruits and vegetables): Drinks water and coffee.  Breakfast: Toast, Lance crackers Lunch: sandwich Dinner: protein and vegetables.   Heart healthy diet discussed. Encouraged to stay as active as possible.    Goals    . Increase physical activity          Improve walking ability and distance.       Fall Risk Fall Risk  07/12/2016 07/11/2015 03/16/2015  Falls in the past year? Yes Yes No  Number falls in past yr: 2 or more 2 or more -  Injury with Fall? No No -  Risk Factor Category  - High Fall Risk -  Risk for fall due to : Impaired balance/gait History of fall(s) Impaired balance/gait  Follow up Falls prevention discussed Falls evaluation completed;Falls prevention discussed;Education provided -   Depression Screen PHQ 2/9 Scores 07/12/2016 07/11/2015 03/16/2015  PHQ - 2 Score 0 0 0     Cognitive Function MMSE - Mini Mental State Exam 07/12/2016  Not completed: Refused        Immunization History  Administered Date(s) Administered  . Influenza Split 12/16/2012, 12/30/2013  .  Influenza Whole 12/17/2011  . Influenza, High Dose Seasonal PF 12/12/2015  . Pneumococcal-Unspecified 12/17/2010, 12/30/2013  . Tdap 04/11/2015   Screening Tests Health Maintenance  Topic Date Due  . INFLUENZA VACCINE  10/16/2016  . DEXA SCAN  07/20/2017  . TETANUS/TDAP  04/10/2025  . PNA vac Low Risk Adult  Completed      Plan:    Make audiology appointment.   Bring a copy of your advance directives to your next office visit.  Continue doing brain stimulating activities (puzzles, reading, adult coloring books, staying active) to keep memory sharp.    I have personally reviewed and noted the following in the patient's chart:   . Medical and social history . Use of alcohol, tobacco or illicit drugs  . Current medications and supplements . Functional ability and status . Nutritional status . Physical activity . Advanced directives . List of other physicians . Hospitalizations, surgeries, and ER visits in previous 12 months . Vitals . Screenings to include cognitive, depression, and falls . Referrals and appointments  In addition, I have reviewed and discussed with patient certain preventive protocols, quality metrics, and best practice recommendations. A written personalized care plan for preventive services as well as general preventive health recommendations were provided to patient.     Gerilyn Nestle, RN  07/12/2016   _____________________________________________________________ Juluis Rainier:  Patient has fallen several times recently (bruising but no injury). Daughter or son is with patient at all times now. Also plans to get "Life Alert" emergency button.   Declines mammogram testing.   F/U appointment with PCP on 07/23/16.  Medical screening examination/treatment/procedure(s) were performed by non-physician practitioner and as supervising physician I was immediately available for consultation/collaboration.  I agree with above assessment and plan.  Electronically  Signed by: Howard Pouch, DO [facility]

## 2016-07-23 ENCOUNTER — Ambulatory Visit: Payer: Medicare Other | Admitting: Family Medicine

## 2016-07-31 ENCOUNTER — Encounter: Payer: Self-pay | Admitting: Family Medicine

## 2016-07-31 ENCOUNTER — Ambulatory Visit (INDEPENDENT_AMBULATORY_CARE_PROVIDER_SITE_OTHER): Payer: Medicare Other | Admitting: Family Medicine

## 2016-07-31 VITALS — BP 145/69 | HR 68 | Temp 98.1°F | Resp 20 | Ht 64.0 in | Wt 197.0 lb

## 2016-07-31 DIAGNOSIS — M353 Polymyalgia rheumatica: Secondary | ICD-10-CM

## 2016-07-31 DIAGNOSIS — R296 Repeated falls: Secondary | ICD-10-CM | POA: Diagnosis not present

## 2016-07-31 DIAGNOSIS — J4540 Moderate persistent asthma, uncomplicated: Secondary | ICD-10-CM | POA: Diagnosis not present

## 2016-07-31 DIAGNOSIS — M316 Other giant cell arteritis: Secondary | ICD-10-CM

## 2016-07-31 DIAGNOSIS — W19XXXA Unspecified fall, initial encounter: Secondary | ICD-10-CM

## 2016-07-31 DIAGNOSIS — F418 Other specified anxiety disorders: Secondary | ICD-10-CM | POA: Diagnosis not present

## 2016-07-31 DIAGNOSIS — I10 Essential (primary) hypertension: Secondary | ICD-10-CM | POA: Diagnosis not present

## 2016-07-31 MED ORDER — OLMESARTAN MEDOXOMIL 20 MG PO TABS: 20.0000 mg | ORAL_TABLET | Freq: Every day | ORAL | 1 refills | Status: DC

## 2016-07-31 NOTE — Progress Notes (Signed)
[name] , [birth year], 83 y.o., female MRN: [id number] Patient Care Team    Relationship Specialty Notifications Start End  Ma Hillock, DO PCP - General Family Medicine  03/16/15   Hermelinda Medicus, MD  Internal Medicine  07/11/15   Fay Records, MD Consulting Physician Cardiology  07/11/15   [place] Dragon, MD (Inactive)  Gastroenterology  07/11/15   Tanda Rockers, MD Consulting Physician Pulmonary Disease  07/11/15   Jessy Oto, MD Consulting Physician Orthopedic Surgery  07/12/16   Magnus Sinning, MD Consulting Physician Physical Medicine and Rehabilitation  07/12/16     Chief Complaint  Patient presents with  . Hypertension    Subjective:  Hypertension: Pt reports compliance with Benicar 20 mg. Blood pressures ranges at home ~ 130/60. Patient denies chest pain, shortness of breath or lower extremity edema. Pt takes a daily baby ASA. Pt is intolerant to statin.  Depression with anxiety: Patient reports she is doing rather well with her depression and anxiety. She feels she has been much improved over the last year. She continues to take the Valium 2.5 mg occasionally. She continues to take the Prozac 80 mg daily. She had been on a lower dose in the past, but was needing more coverage. She feels she is finally getting over or at least coping better with the stress surrounding her husband. Her family is very supportive for her, and her daughter lives next to her.  Recent fall: Patient presents with her daughter today. She reportedly has had 3 falls within the last 6 months. She has not had treatment or gone to be evaluated with any of these falls. Patient is supposed to walk with a rolling walker, but sometimes forgets to use this. This has been the cause of one of the falls. The other fall occurred when she became off balance when trying to get out of bed. The most recent fall was when she was trying to sit on the toilet and lost her balance hitting against the back of the  toilet. She states that she had quite a bruise on her back and left side of her hip from this fall. This occurred approximately 3-4 weeks ago. Her daughter states that she still has 2 large bruises on her back from this fall. Patient does not complain of pain.  Asthma: Patient reports compliance with Symbicort 2 puffs twice a day 80-4 0.5. She rarely needs her albuterol. She is now able to walk without dyspnea. She is feeling much better since restarting these medications.  Depression screen [facility] 2/9 07/12/2016 07/11/2015 03/16/2015  Decreased Interest 0 0 0  Down, Depressed, Hopeless 0 0 0  PHQ - 2 Score 0 0 0    Allergies  Allergen Reactions  . Advair Diskus [Fluticasone-Salmeterol] Other (See Comments)    Mouth sores  . Atorvastatin Other (See Comments)    Severe headache  . Statins   . Sulfonamide Derivatives Rash   Social History  Substance Use Topics  . Smoking status: Never Smoker  . Smokeless tobacco: Never Used  . Alcohol use No   Past Medical History:  Diagnosis Date  . Abdominal bloating   . Abnormal LFTs (liver function tests)   . Anxiety disorder   . Arthritis   . Asthma   . Depression   . Diverticulosis   . Edema   . Fatty liver   . Fibromyalgia   . GERD (gastroesophageal reflux disease)   . Giant cell arteritis ([place])  No biopsy secondary to steroid use  . Hx of adenomatous colonic polyps   . Hyperlipidemia   . Hypertension   . IBS (irritable bowel syndrome)   . Lumbar radiculitis   . Peptic ulcer disease   . Skin cancer    shoulder, leg, right eye (? squamous by resection)  . Thoracic radiculitis   . Thyroid nodule    "removed"  . Urinary incontinence   . Vertigo    Past Surgical History:  Procedure Laterality Date  . ABDOMINAL HYSTERECTOMY    . BACK SURGERY  2005   lower back  . EYE SURGERY Right 2006  . LIVER BIOPSY     fatty liver  . NASAL SEPTUM SURGERY    . right knee surgery  2006   arthroscopy  . right leg skin cancer surgery  2014   . right shoulder skin cancer excision  2012  . THYROIDECTOMY     Family History  Problem Relation Age of Onset  . Colon polyps Maternal Aunt   . Diabetes Father   . Heart disease Father   . Heart disease Mother   . Lung disease Mother   . COPD Mother   . Lung cancer Brother   . Cancer Brother        lung  . COPD Brother   . Stroke Brother   . Lung cancer Sister   . COPD Sister   . Kidney disease Unknown        niece-mat side  . Arthritis Unknown   . Diabetes Son   . Arthritis Daughter   . Colon cancer Neg Hx    Allergies as of 07/31/2016      Reactions   Advair Diskus [fluticasone-salmeterol] Other (See Comments)   Mouth sores   Atorvastatin Other (See Comments)   Severe headache   Statins    Sulfonamide Derivatives Rash      Medication List       Accurate as of 07/31/16  2:01 PM. Always use your most recent med list.          Adult Blood Pressure Cuff Lg Kit Large to XL kit.   albuterol 108 (90 Base) MCG/ACT inhaler Commonly known as:  PROAIR HFA 2 puffs up to every 4 hours if can't catch your breath   alendronate 70 MG tablet Commonly known as:  FOSAMAX Take 70 mg by mouth once a week. Take with a full glass of water on an empty stomach.   aspirin 81 MG tablet Take 81 mg by mouth daily.   azelastine 0.1 % nasal spray Commonly known as:  ASTELIN Place 2 sprays into both nostrils at bedtime as needed. Use in each nostril as directed   budesonide-formoterol 80-4.5 MCG/ACT inhaler Commonly known as:  SYMBICORT Inhale 2 puffs into the lungs 2 (two) times daily.   CHLOR-TRIMETON 4 MG tablet Generic drug:  chlorpheniramine 2 tabs by mouth at bedtime.  May add 1 every 4 hours as needed for drippy nose, drainage, throat clearing.   diazepam 5 MG tablet Commonly known as:  VALIUM Take 1/2 tablet by mouth twice daily as needed for back spasms   esomeprazole 40 MG capsule Commonly known as:  NEXIUM Take 40 mg by mouth daily.   famotidine 20 MG  tablet Commonly known as:  PEPCID Take 20 mg by mouth at bedtime.   ferrous sulfate 325 (65 FE) MG tablet Take 1 tablet (325 mg total) by mouth 3 (three) times daily with meals.  FLUoxetine 20 MG capsule Commonly known as:  PROZAC TAKE 4 CAPSULES DAILY   fluticasone 50 MCG/ACT nasal spray Commonly known as:  FLONASE Place 2 sprays into both nostrils daily.   folic acid 1 MG tablet Commonly known as:  FOLVITE Take 1 mg by mouth daily.   HYDROcodone-acetaminophen 5-325 MG tablet Commonly known as:  NORCO/VICODIN Take 1 tablet by mouth every 4 (four) hours as needed for moderate pain.   ibuprofen 400 MG tablet Commonly known as:  ADVIL,MOTRIN Take 400 mg by mouth as needed.   magic mouthwash Soln Take 5 mLs by mouth 2 (two) times daily as needed for mouth pain.   methotrexate 2.5 MG tablet Commonly known as:  RHEUMATREX Take 12.5 mg by mouth once a week. Caution:Chemotherapy. Protect from light.   olmesartan 20 MG tablet Commonly known as:  BENICAR Take 1 tablet (20 mg total) by mouth daily.   oxycodone 5 MG capsule Commonly known as:  OXY-IR Take 1/2-1 tablet by mouth every 8 hours as needed for severe back pain   predniSONE 5 MG tablet Commonly known as:  DELTASONE Take 5 mg by mouth 2 (two) times daily.   rOPINIRole 0.5 MG tablet Commonly known as:  REQUIP       No results found for this or any previous visit (from the past 24 hour(s)). No results found.   ROS: Negative, with the exception of above mentioned in HPI   Objective:  BP (!) 145/69 (BP Location: Left Arm, Patient Position: Sitting, Cuff Size: Large)   Pulse 68   Temp 98.1 F (36.7 C)   Resp 20   Ht _0  (1.626 m)   Wt 197 lb (89.4 kg)   SpO2 97%   BMI 33.81 kg/m  Body mass index is 33.81 kg/m. Gen: Afebrile. No acute distress.  HENT: AT. [place].  MMM.  Eyes:Pupils Equal Round Reactive to light, Extraocular movements intact,  Conjunctiva without redness, discharge or icterus. CV: RRR,  no edema, +2/4 P posterior tibialis pulses Chest: CTAB, no wheeze or crackles Abd: Soft. NTND. BS present.  Skin: no rashes, purpura or petechiae.  Neuro/msk: Walks with a rolling walker. PERLA. EOMi. Alert. Oriented. Cranial nerves II through XII intact. Muscle strength 5/5 bilateral lower extremity, with the exception of 4/5 right  Dorsiflexion. DTRs equal bilaterally. Psych: Normal affect, dress and demeanor. Normal speech. Normal thought content and judgment.   Assessment/Plan: Trenyce Loera is a 83 y.o. female present for acute OV for  Essential hypertension, benign - Blood pressure mildly elevated here, however all recordings from home are stable. - Low-sodium diet - Attempt to stay as active as possible - Continue baby aspirin - Intolerant to statins - Continue olmesartan (BENICAR) 20 MG tablet; Take 1 tablet (20 mg total) by mouth daily.  Dispense: 90 tablet; Refill: 1 - CBC and BMP next visit. - Follow-up 4-6 months  Fall, initial encounter - Report 3 falls within the last 6 months, has not been seen for any of these falls. Last fall approximately 3-4 weeks ago. Patient does have history of lumbar spine fusion and right drop foot. Discussed needing to be seen when she falls, this could become dangerous for her especially if she hits her head. Patient agreeable to home safety eval and physical therapy if needed.  Moderate persistent asthma without complication Doing rather well on Symbicort 2 puffs twice a day. Refills on Symbicort and albuterol today.  Giant cell arteritis (HCC) Polymyalgia rheumatica ([place]) Continue to follow with rheumatology.  Patient is on methotrexate and chronic steroids.  Depression with anxiety Doing well on Prozac. She is on high-dose, would eventually like to taper back down if able. She is doing much better with her depression and anxiety. She is using Valium 2.5 mg every once in a while.   electronically signed by:  Howard Pouch, DO  [place]

## 2016-07-31 NOTE — Patient Instructions (Addendum)
If you fall again: make sure you pay attention if you have chest pain, loss of consciousness (even if just a second). Physical therapy will come out and do a safety evaluation of the house and make recommendations.    BP looks good. I have refilled this med for you.    It is important to avoid accidents which may result in broken bones.  Here are a few ideas on how to make your home safer so you will be less likely to trip or fall.  1. Use nonskid mats or non slip strips in your shower or tub, on your bathroom floor and around sinks.  If you know that you have spilled water, wipe it up! 2. In the bathroom, it is important to have properly installed grab bars on the walls or on the edge of the tub.  Towel racks are NOT strong enough for you to hold onto or to pull on for support. 3. Stairs and hallways should have enough light.  Add lamps or night lights if you need ore light. 4. It is good to have handrails on both sides of the stairs if possible.  Always fix broken handrails right away. 5. It is important to see the edges of steps.  Paint the edges of outdoor steps white so you can see them better.  Put colored tape on the edge of inside steps. 6. Throw-rugs are dangerous because they can slide.  Removing the rugs is the best idea, but if they must stay, add adhesive carpet tape to prevent slipping. 7. Do not keep things on stairs or in the halls.  Remove small furniture that blocks the halls as it may cause you to trip.  Keep telephone and electrical cords out of the way where you walk. 8. Always were sturdy, rubber-soled shoes for good support.  Never wear just socks, especially on the stairs.  Socks may cause you to slip or fall.  Do not wear full-length housecoats as you can easily trip on the bottom.  9. Place the things you use the most on the shelves that are the easiest to reach.  If you use a stepstool, make sure it is in good condition.  If you feel unsteady, DO NOT climb, ask for  help. 10. If a health professional advises you to use a cane or walker, do not be ashamed.  These items can keep you from falling and breaking your bones.

## 2016-08-01 ENCOUNTER — Ambulatory Visit (INDEPENDENT_AMBULATORY_CARE_PROVIDER_SITE_OTHER): Payer: Self-pay | Admitting: Specialist

## 2016-08-02 DIAGNOSIS — R296 Repeated falls: Secondary | ICD-10-CM | POA: Insufficient documentation

## 2016-08-02 DIAGNOSIS — W19XXXA Unspecified fall, initial encounter: Secondary | ICD-10-CM | POA: Insufficient documentation

## 2016-08-02 MED ORDER — FLUOXETINE HCL 20 MG PO CAPS: 80.0000 mg | ORAL_CAPSULE | Freq: Every day | ORAL | 3 refills | Status: DC

## 2016-08-02 MED ORDER — BUDESONIDE-FORMOTEROL FUMARATE 80-4.5 MCG/ACT IN AERO: 2.0000 | INHALATION_SPRAY | Freq: Two times a day (BID) | RESPIRATORY_TRACT | 1 refills | Status: DC

## 2016-08-03 DIAGNOSIS — J4540 Moderate persistent asthma, uncomplicated: Secondary | ICD-10-CM | POA: Diagnosis not present

## 2016-08-03 DIAGNOSIS — M353 Polymyalgia rheumatica: Secondary | ICD-10-CM | POA: Diagnosis not present

## 2016-08-03 DIAGNOSIS — M316 Other giant cell arteritis: Secondary | ICD-10-CM | POA: Diagnosis not present

## 2016-08-03 DIAGNOSIS — Z7951 Long term (current) use of inhaled steroids: Secondary | ICD-10-CM | POA: Diagnosis not present

## 2016-08-03 DIAGNOSIS — M797 Fibromyalgia: Secondary | ICD-10-CM | POA: Diagnosis not present

## 2016-08-03 DIAGNOSIS — M5416 Radiculopathy, lumbar region: Secondary | ICD-10-CM | POA: Diagnosis not present

## 2016-08-03 DIAGNOSIS — M21371 Foot drop, right foot: Secondary | ICD-10-CM | POA: Diagnosis not present

## 2016-08-03 DIAGNOSIS — Z7982 Long term (current) use of aspirin: Secondary | ICD-10-CM | POA: Diagnosis not present

## 2016-08-03 DIAGNOSIS — Z9181 History of falling: Secondary | ICD-10-CM | POA: Diagnosis not present

## 2016-08-03 DIAGNOSIS — R296 Repeated falls: Secondary | ICD-10-CM | POA: Diagnosis not present

## 2016-08-03 DIAGNOSIS — I10 Essential (primary) hypertension: Secondary | ICD-10-CM | POA: Diagnosis not present

## 2016-08-03 DIAGNOSIS — F341 Dysthymic disorder: Secondary | ICD-10-CM | POA: Diagnosis not present

## 2016-08-07 DIAGNOSIS — R296 Repeated falls: Secondary | ICD-10-CM | POA: Diagnosis not present

## 2016-08-07 DIAGNOSIS — M353 Polymyalgia rheumatica: Secondary | ICD-10-CM | POA: Diagnosis not present

## 2016-08-07 DIAGNOSIS — M5416 Radiculopathy, lumbar region: Secondary | ICD-10-CM | POA: Diagnosis not present

## 2016-08-07 DIAGNOSIS — J4540 Moderate persistent asthma, uncomplicated: Secondary | ICD-10-CM | POA: Diagnosis not present

## 2016-08-07 DIAGNOSIS — I10 Essential (primary) hypertension: Secondary | ICD-10-CM | POA: Diagnosis not present

## 2016-08-07 DIAGNOSIS — M21371 Foot drop, right foot: Secondary | ICD-10-CM | POA: Diagnosis not present

## 2016-08-09 DIAGNOSIS — J4540 Moderate persistent asthma, uncomplicated: Secondary | ICD-10-CM | POA: Diagnosis not present

## 2016-08-09 DIAGNOSIS — M21371 Foot drop, right foot: Secondary | ICD-10-CM | POA: Diagnosis not present

## 2016-08-09 DIAGNOSIS — R296 Repeated falls: Secondary | ICD-10-CM | POA: Diagnosis not present

## 2016-08-09 DIAGNOSIS — I10 Essential (primary) hypertension: Secondary | ICD-10-CM | POA: Diagnosis not present

## 2016-08-09 DIAGNOSIS — M353 Polymyalgia rheumatica: Secondary | ICD-10-CM | POA: Diagnosis not present

## 2016-08-09 DIAGNOSIS — M5416 Radiculopathy, lumbar region: Secondary | ICD-10-CM | POA: Diagnosis not present

## 2016-08-13 DIAGNOSIS — R296 Repeated falls: Secondary | ICD-10-CM | POA: Diagnosis not present

## 2016-08-13 DIAGNOSIS — I10 Essential (primary) hypertension: Secondary | ICD-10-CM | POA: Diagnosis not present

## 2016-08-13 DIAGNOSIS — M21371 Foot drop, right foot: Secondary | ICD-10-CM | POA: Diagnosis not present

## 2016-08-13 DIAGNOSIS — J4540 Moderate persistent asthma, uncomplicated: Secondary | ICD-10-CM | POA: Diagnosis not present

## 2016-08-13 DIAGNOSIS — M353 Polymyalgia rheumatica: Secondary | ICD-10-CM | POA: Diagnosis not present

## 2016-08-13 DIAGNOSIS — M5416 Radiculopathy, lumbar region: Secondary | ICD-10-CM | POA: Diagnosis not present

## 2016-08-14 DIAGNOSIS — R296 Repeated falls: Secondary | ICD-10-CM | POA: Diagnosis not present

## 2016-08-14 DIAGNOSIS — M353 Polymyalgia rheumatica: Secondary | ICD-10-CM | POA: Diagnosis not present

## 2016-08-14 DIAGNOSIS — J4540 Moderate persistent asthma, uncomplicated: Secondary | ICD-10-CM | POA: Diagnosis not present

## 2016-08-14 DIAGNOSIS — M21371 Foot drop, right foot: Secondary | ICD-10-CM | POA: Diagnosis not present

## 2016-08-14 DIAGNOSIS — M5416 Radiculopathy, lumbar region: Secondary | ICD-10-CM | POA: Diagnosis not present

## 2016-08-14 DIAGNOSIS — I10 Essential (primary) hypertension: Secondary | ICD-10-CM | POA: Diagnosis not present

## 2016-08-21 DIAGNOSIS — M353 Polymyalgia rheumatica: Secondary | ICD-10-CM | POA: Diagnosis not present

## 2016-08-21 DIAGNOSIS — R296 Repeated falls: Secondary | ICD-10-CM | POA: Diagnosis not present

## 2016-08-21 DIAGNOSIS — I10 Essential (primary) hypertension: Secondary | ICD-10-CM | POA: Diagnosis not present

## 2016-08-21 DIAGNOSIS — M21371 Foot drop, right foot: Secondary | ICD-10-CM | POA: Diagnosis not present

## 2016-08-21 DIAGNOSIS — J4540 Moderate persistent asthma, uncomplicated: Secondary | ICD-10-CM | POA: Diagnosis not present

## 2016-08-21 DIAGNOSIS — M5416 Radiculopathy, lumbar region: Secondary | ICD-10-CM | POA: Diagnosis not present

## 2016-08-23 DIAGNOSIS — J4540 Moderate persistent asthma, uncomplicated: Secondary | ICD-10-CM | POA: Diagnosis not present

## 2016-08-23 DIAGNOSIS — M5416 Radiculopathy, lumbar region: Secondary | ICD-10-CM | POA: Diagnosis not present

## 2016-08-23 DIAGNOSIS — M21371 Foot drop, right foot: Secondary | ICD-10-CM | POA: Diagnosis not present

## 2016-08-23 DIAGNOSIS — M353 Polymyalgia rheumatica: Secondary | ICD-10-CM | POA: Diagnosis not present

## 2016-08-23 DIAGNOSIS — R296 Repeated falls: Secondary | ICD-10-CM | POA: Diagnosis not present

## 2016-08-23 DIAGNOSIS — I10 Essential (primary) hypertension: Secondary | ICD-10-CM | POA: Diagnosis not present

## 2016-08-27 DIAGNOSIS — M5416 Radiculopathy, lumbar region: Secondary | ICD-10-CM | POA: Diagnosis not present

## 2016-08-27 DIAGNOSIS — J4540 Moderate persistent asthma, uncomplicated: Secondary | ICD-10-CM | POA: Diagnosis not present

## 2016-08-27 DIAGNOSIS — M353 Polymyalgia rheumatica: Secondary | ICD-10-CM | POA: Diagnosis not present

## 2016-08-27 DIAGNOSIS — I10 Essential (primary) hypertension: Secondary | ICD-10-CM | POA: Diagnosis not present

## 2016-08-27 DIAGNOSIS — M21371 Foot drop, right foot: Secondary | ICD-10-CM | POA: Diagnosis not present

## 2016-08-27 DIAGNOSIS — R296 Repeated falls: Secondary | ICD-10-CM | POA: Diagnosis not present

## 2016-08-29 DIAGNOSIS — M5416 Radiculopathy, lumbar region: Secondary | ICD-10-CM | POA: Diagnosis not present

## 2016-08-29 DIAGNOSIS — M21371 Foot drop, right foot: Secondary | ICD-10-CM | POA: Diagnosis not present

## 2016-08-29 DIAGNOSIS — I10 Essential (primary) hypertension: Secondary | ICD-10-CM | POA: Diagnosis not present

## 2016-08-29 DIAGNOSIS — M353 Polymyalgia rheumatica: Secondary | ICD-10-CM | POA: Diagnosis not present

## 2016-08-29 DIAGNOSIS — J4540 Moderate persistent asthma, uncomplicated: Secondary | ICD-10-CM | POA: Diagnosis not present

## 2016-08-29 DIAGNOSIS — R296 Repeated falls: Secondary | ICD-10-CM | POA: Diagnosis not present

## 2016-09-11 ENCOUNTER — Other Ambulatory Visit: Payer: Self-pay | Admitting: Family Medicine

## 2016-09-11 DIAGNOSIS — I10 Essential (primary) hypertension: Secondary | ICD-10-CM

## 2016-10-08 DIAGNOSIS — M316 Other giant cell arteritis: Secondary | ICD-10-CM | POA: Diagnosis not present

## 2016-10-08 DIAGNOSIS — Z79899 Other long term (current) drug therapy: Secondary | ICD-10-CM | POA: Diagnosis not present

## 2016-10-22 ENCOUNTER — Telehealth: Payer: Self-pay | Admitting: Family Medicine

## 2016-10-22 NOTE — Telephone Encounter (Signed)
Patient's R upper back area is sore after falling while on toilet. Patient's daughter requesting an appt. Patient is a 30 minute patient, no appointments available this week. Please advise.

## 2016-10-22 NOTE — Telephone Encounter (Signed)
Spoke with patients daughter, [name]. Daughter reports patient fell back on toilet while getting up on 10/15/16 hitting her back. Patient thought pain was improving, but has increased over the last few days. Requesting an appointment for this week with PCP. Please advise. (Possible Thurs 10/25/14 @ 1045, but will need PCP approval to unblock).

## 2016-10-22 NOTE — Telephone Encounter (Signed)
I do not understand the concern over the appt being scheduled in an already open slot. If it is bc she is a 30 minute pt... Then schedule it and block the 1115 slot, instead of the 1100 slot.

## 2016-10-24 ENCOUNTER — Ambulatory Visit (INDEPENDENT_AMBULATORY_CARE_PROVIDER_SITE_OTHER)
Admission: RE | Admit: 2016-10-24 | Discharge: 2016-10-24 | Disposition: A | Discharge status: Home / Self Care | Payer: Medicare Other | Source: Ambulatory Visit | Attending: Family Medicine | Admitting: Family Medicine

## 2016-10-24 ENCOUNTER — Ambulatory Visit (INDEPENDENT_AMBULATORY_CARE_PROVIDER_SITE_OTHER): Payer: Medicare Other | Admitting: Family Medicine

## 2016-10-24 ENCOUNTER — Encounter: Payer: Self-pay | Admitting: Family Medicine

## 2016-10-24 VITALS — BP 153/66 | HR 73 | Temp 98.2°F | Resp 20 | Ht 64.0 in | Wt 195.5 lb

## 2016-10-24 DIAGNOSIS — R0781 Pleurodynia: Secondary | ICD-10-CM

## 2016-10-24 DIAGNOSIS — W19XXXA Unspecified fall, initial encounter: Secondary | ICD-10-CM

## 2016-10-24 DIAGNOSIS — S299XXA Unspecified injury of thorax, initial encounter: Secondary | ICD-10-CM | POA: Diagnosis not present

## 2016-10-24 MED ORDER — BACLOFEN 10 MG PO TABS: 10.0000 mg | ORAL_TABLET | Freq: Two times a day (BID) | ORAL | 0 refills | Status: DC

## 2016-10-24 MED ORDER — HYDROCODONE-ACETAMINOPHEN 5-325 MG PO TABS: 1.0000 | ORAL_TABLET | Freq: Two times a day (BID) | ORAL | 0 refills | Status: DC | PRN

## 2016-10-24 NOTE — Patient Instructions (Signed)
Start baclofen (muscle relaxer). Use heat for comfort.  Make certain to take deep breaths every couple minutes (like commercial breaks etc). Watch for any signs of pneumonia development.  Pain med just as needed every 12 hours. Can not refill without appt.   Get the xray at [place] office today.   Start using potty chair by bed at night.      Rib Fracture A rib fracture is a break or crack in one of the bones of the ribs. The ribs are a group of long, curved bones that wrap around your chest and attach to your spine. They protect your lungs and other organs in the chest cavity. A broken or cracked rib is often painful, but most do not cause other problems. Most rib fractures heal on their own over time. However, rib fractures can be more serious if multiple ribs are broken or if broken ribs move out of place and push against other structures. What are the causes?  A direct blow to the chest. For example, this could happen during contact sports, a car accident, or a fall against a hard object.  Repetitive movements with high force, such as pitching a baseball or having severe coughing spells. What are the signs or symptoms?  Pain when you breathe in or cough.  Pain when someone presses on the injured area. How is this diagnosed? Your caregiver will perform a physical exam. Various imaging tests may be ordered to confirm the diagnosis and to look for related injuries. These tests may include a chest X-ray, computed tomography (CT), magnetic resonance imaging (MRI), or a bone scan. How is this treated? Rib fractures usually heal on their own in 1-3 months. The longer healing period is often associated with a continued cough or other aggravating activities. During the healing period, pain control is very important. Medication is usually given to control pain. Hospitalization or surgery may be needed for more severe injuries, such as those in which multiple ribs are broken or the ribs have moved  out of place. Follow these instructions at home:  Avoid strenuous activity and any activities or movements that cause pain. Be careful during activities and avoid bumping the injured rib.  Gradually increase activity as directed by your caregiver.  Only take over-the-counter or prescription medications as directed by your caregiver. Do not take other medications without asking your caregiver first.  Apply ice to the injured area for the first 1-2 days after you have been treated or as directed by your caregiver. Applying ice helps to reduce inflammation and pain. ? Put ice in a plastic bag. ? Place a towel between your skin and the bag. ? Leave the ice on for 15-20 minutes at a time, every 2 hours while you are awake.  Perform deep breathing as directed by your caregiver. This will help prevent pneumonia, which is a common complication of a broken rib. Your caregiver may instruct you to: ? Take deep breaths several times a day. ? Try to cough several times a day, holding a pillow against the injured area. ? Use a device called an incentive spirometer to practice deep breathing several times a day.  Drink enough fluids to keep your urine clear or pale yellow. This will help you avoid constipation.  Do not wear a rib belt or binder. These restrict breathing, which can lead to pneumonia. Get help right away if:  You have a fever.  You have difficulty breathing or shortness of breath.  You develop a  continual cough, or you cough up thick or bloody sputum.  You feel sick to your stomach (nausea), throw up (vomit), or have abdominal pain.  You have worsening pain not controlled with medications. This information is not intended to replace advice given to you by your health care provider. Make sure you discuss any questions you have with your health care provider. Document Released: 03/04/2005 Document Revised: 08/10/2015 Document Reviewed: 05/06/2012 Elsevier Interactive Patient  Education  [employer].

## 2016-10-24 NOTE — Progress Notes (Signed)
[name] , Apr 21, 1929, 83 y.o., female MRN: [id number] Patient Care Team    Relationship Specialty Notifications Start End  Ma Hillock, DO PCP - General Family Medicine  03/16/15   Hermelinda Medicus, MD  Internal Medicine  07/11/15   Fay Records, MD Consulting Physician Cardiology  07/11/15   Lafayette Dragon, MD (Inactive)  Gastroenterology  07/11/15   Tanda Rockers, MD Consulting Physician Pulmonary Disease  07/11/15   Jessy Oto, MD Consulting Physician Orthopedic Surgery  07/12/16   Magnus Sinning, MD Consulting Physician Physical Medicine and Rehabilitation  07/12/16     Chief Complaint  Patient presents with  . Back Pain    fall- right flank area     Subjective: Pt presents for an OV with complaints of Back pain after fall. Patient present with her daughter today who also is per the history. Patient apparently fell backwards onto the commode hitting her back against the back of the commode approximately 1 week ago. She has done this before within the last few months, and has had a safety evaluation within the home. She has a bar in front of the commode to assist her. She states both of the event had occurred when she got up in the middle the night to use the bathroom. She reached for the assistance bar, but missed it and that caused her to fall back on the commode. She reports discomfort with deep breaths, with right sided thoracic back pain. She endorses bruising on her back. She denies any shortness of breath, fatigue or dizziness. She is eating and drinking well.  Depression screen [facility] 2/9 07/12/2016 07/11/2015 03/16/2015  Decreased Interest 0 0 0  Down, Depressed, Hopeless 0 0 0  PHQ - 2 Score 0 0 0    Allergies  Allergen Reactions  . Atorvastatin Other (See Comments)    Severe headache  . Statins   . Advair Diskus [Fluticasone-Salmeterol] Other (See Comments)    Mouth sores  . Sulfonamide Derivatives Rash   Social History  Substance Use Topics  .  Smoking status: Never Smoker  . Smokeless tobacco: Never Used  . Alcohol use No   Past Medical History:  Diagnosis Date  . Abdominal bloating   . Abnormal LFTs (liver function tests)   . Anxiety disorder   . Arthritis   . Asthma   . Depression   . Diverticulosis   . Edema   . Fatty liver   . Fibromyalgia   . GERD (gastroesophageal reflux disease)   . Giant cell arteritis (HCC)    No biopsy secondary to steroid use  . Hx of adenomatous colonic polyps   . Hyperlipidemia   . Hypertension   . IBS (irritable bowel syndrome)   . Lumbar radiculitis   . Peptic ulcer disease   . Skin cancer    shoulder, leg, right eye (? squamous by resection)  . Thoracic radiculitis   . Thyroid nodule    "removed"  . Urinary incontinence   . Vertigo    Past Surgical History:  Procedure Laterality Date  . ABDOMINAL HYSTERECTOMY    . BACK SURGERY  2005   lower back  . EYE SURGERY Right 2006  . LIVER BIOPSY     fatty liver  . NASAL SEPTUM SURGERY    . right knee surgery  2006   arthroscopy  . right leg skin cancer surgery  2014  . right shoulder skin cancer excision  2012  . THYROIDECTOMY  Family History  Problem Relation Age of Onset  . Colon polyps Maternal Aunt   . Diabetes Father   . Heart disease Father   . Heart disease Mother   . Lung disease Mother   . COPD Mother   . Lung cancer Brother   . Cancer Brother        lung  . COPD Brother   . Stroke Brother   . Lung cancer Sister   . COPD Sister   . Kidney disease Unknown        niece-mat side  . Arthritis Unknown   . Diabetes Son   . Arthritis Daughter   . Colon cancer Neg Hx    Allergies as of 10/24/2016      Reactions   Atorvastatin Other (See Comments)   Severe headache   Statins    Advair Diskus [fluticasone-salmeterol] Other (See Comments)   Mouth sores   Sulfonamide Derivatives Rash      Medication List       Accurate as of 10/24/16 11:59 PM. Always use your most recent med list.          albuterol  108 (90 Base) MCG/ACT inhaler Commonly known as:  PROAIR HFA 2 puffs up to every 4 hours if can't catch your breath   alendronate 70 MG tablet Commonly known as:  FOSAMAX Take 70 mg by mouth once a week. Take with a full glass of water on an empty stomach.   aspirin 81 MG tablet Take 81 mg by mouth daily.   baclofen 10 MG tablet Commonly known as:  LIORESAL Take 1 tablet (10 mg total) by mouth 2 (two) times daily.   BENICAR 20 MG tablet Generic drug:  olmesartan TAKE 1 TABLET DAILY   budesonide-formoterol 80-4.5 MCG/ACT inhaler Commonly known as:  SYMBICORT Inhale 2 puffs into the lungs 2 (two) times daily.   diazepam 5 MG tablet Commonly known as:  VALIUM Take 1/2 tablet by mouth twice daily as needed for back spasms   esomeprazole 40 MG capsule Commonly known as:  NEXIUM Take 40 mg by mouth daily.   famotidine 20 MG tablet Commonly known as:  PEPCID Take 20 mg by mouth at bedtime.   ferrous sulfate 325 (65 FE) MG tablet Take 1 tablet (325 mg total) by mouth 3 (three) times daily with meals.   FLUoxetine 20 MG capsule Commonly known as:  PROZAC Take 4 capsules (80 mg total) by mouth daily.   folic acid 1 MG tablet Commonly known as:  FOLVITE Take 1 mg by mouth daily.   HYDROcodone-acetaminophen 5-325 MG tablet Commonly known as:  NORCO/VICODIN Take 1 tablet by mouth every 12 (twelve) hours as needed for moderate pain.   methotrexate 2.5 MG tablet Commonly known as:  RHEUMATREX Take 7.5 mg by mouth once a week. Caution:Chemotherapy. Protect from light.   predniSONE 5 MG tablet Commonly known as:  DELTASONE Take 5 mg by mouth 2 (two) times daily.       All past medical history, surgical history, allergies, family history, immunizations andmedications were updated in the EMR today and reviewed under the history and medication portions of their EMR.     ROS: Negative, with the exception of above mentioned in HPI   Objective:  BP (!) 153/66 (BP Location:  Right Arm, Patient Position: Sitting, Cuff Size: Large)   Pulse 73   Temp 98.2 F (36.8 C)   Resp 20   Ht 5\' 4"  (1.626 m)   Wt 195  lb 8 oz (88.7 kg)   SpO2 95%   BMI 33.56 kg/m  Body mass index is 33.56 kg/m. Gen: Afebrile. No acute distress. Nontoxic in appearance, well developed, well nourished. Very pleasant Caucasian female. HENT: AT. [place].  MMM, no oral lesions. Eyes:Pupils Equal Round Reactive to light, Extraocular movements intact,  Conjunctiva without redness, discharge or icterus. CV: RRR, no edema Chest: CTAB, no wheeze or crackles. Good air movement, normal resp effort. Obvious discomfort with deep inspiration. Abd: Soft.  Obese . NTND. BS present. no  Masses palpated. No rebound or guarding.  Skin: Moderate bruising right posterior thoracic  ~T8 - 11. No  rashes, purpura or petechiae.  Neuro:  PERLA. EOMi. Alert. Oriented x3  Psych: Normal affect, dress and demeanor. Normal speech. Normal thought content and judgment.  No exam data present No results found. No results found for this or any previous visit (from the past 24 hour(s)).  Assessment/Plan: Annalee Meyerhoff is a 83 y.o. female present for OV for  Fall, initial encounter Rib pain - Patient with moderate bruising and tenderness over right posterior thoracic/ribs. No abdominal or liver tenderness. She does appear uncomfortable with deep breaths. Discussed differential diagnosis with her and her daughter today including rib fracture. They are agreeable to x-ray of ribs and chest. - Incentive spirometry encouraged. Discussed need to take deep breaths frequently couple times an hour. - DG Ribs Unilateral Right; Future - DG Chest 2 View; Future - Monitor for signs and symptoms of infection/pneumonia. - Use bedside commode at night. - Start baclofen to help with muscle relaxation, avoid volume use if April when using baclofen. Heat therapy for comfort. - Agreed to short-term Vicodin use. No refills without appointment. -  Follow-up in 2 weeks if not improved, sooner if worsening.   Reviewed expectations re: course of current medical issues.  Discussed self-management of symptoms.  Outlined signs and symptoms indicating need for more acute intervention.  Patient verbalized understanding and all questions were answered.  Patient received an After-Visit Summary.    Orders Placed This Encounter  Procedures  . DG Ribs Unilateral Right  . DG Chest 2 View     Note is dictated utilizing voice recognition software. Although note has been proof read prior to signing, occasional typographical errors still can be missed. If any questions arise, please do not hesitate to call for verification.   electronically signed by:  Howard Pouch, DO  [place]

## 2016-10-25 ENCOUNTER — Telehealth: Payer: Self-pay | Admitting: Family Medicine

## 2016-10-25 NOTE — Telephone Encounter (Signed)
Patient notified and verbalized understanding. 

## 2016-10-25 NOTE — Telephone Encounter (Signed)
Please call pt: - her xrays are normal.  - remind her to take deep breaths as we discussed and use the medicine as prescribed. Any signs of fever, chills, increased fatigue or cough would want to see again. Bruised ribs can take a few weeks to feel better.  If worsening for any reason, then she should be seen.

## 2016-10-29 ENCOUNTER — Other Ambulatory Visit: Payer: Self-pay | Admitting: Family Medicine

## 2016-12-03 DIAGNOSIS — M316 Other giant cell arteritis: Secondary | ICD-10-CM | POA: Diagnosis not present

## 2016-12-03 DIAGNOSIS — Z79899 Other long term (current) drug therapy: Secondary | ICD-10-CM | POA: Diagnosis not present

## 2016-12-18 ENCOUNTER — Telehealth: Payer: Self-pay | Admitting: Family Medicine

## 2016-12-18 NOTE — Telephone Encounter (Signed)
Data is not clear on immunocompromised pts (prednisone, inhaled steroids and methotrexate) and shingrix with other immunizations.  For this reason I would suggest she separate the two immunizations by at least 4-8 weeks.

## 2016-12-18 NOTE — Telephone Encounter (Signed)
Can patient get a shringrix shot while she is here for her flu shot?

## 2016-12-18 NOTE — Telephone Encounter (Signed)
Left message on voicemail with information

## 2016-12-24 ENCOUNTER — Other Ambulatory Visit: Payer: Self-pay | Admitting: *Deleted

## 2016-12-24 ENCOUNTER — Telehealth: Payer: Self-pay

## 2016-12-24 ENCOUNTER — Telehealth: Payer: Self-pay | Admitting: Family Medicine

## 2016-12-24 ENCOUNTER — Ambulatory Visit (INDEPENDENT_AMBULATORY_CARE_PROVIDER_SITE_OTHER): Payer: Medicare Other

## 2016-12-24 DIAGNOSIS — Z23 Encounter for immunization: Secondary | ICD-10-CM | POA: Diagnosis not present

## 2016-12-24 MED ORDER — ALBUTEROL SULFATE HFA 108 (90 BASE) MCG/ACT IN AERS: INHALATION_SPRAY | RESPIRATORY_TRACT | 2 refills | Status: DC

## 2016-12-24 MED ORDER — BUDESONIDE-FORMOTEROL FUMARATE 80-4.5 MCG/ACT IN AERO: 2.0000 | INHALATION_SPRAY | Freq: Two times a day (BID) | RESPIRATORY_TRACT | 1 refills | Status: DC

## 2016-12-24 NOTE — Telephone Encounter (Signed)
As long as her rheumatologist approves, which it sounds like they are recommending. I would like her to wait about 6 weeks from her flu shot to allow her body to build a good immune response to these medications.  I am uncertain if your asking for a script to be sent to pharmacy?

## 2016-12-24 NOTE — Telephone Encounter (Signed)
Inhalers refilled.

## 2016-12-24 NOTE — Telephone Encounter (Signed)
Patient's daughter states that [facility] cannot fill Insurance underwriter. It is going to patient's rheumatologist, patient's rheumatologist will not fill it.

## 2016-12-24 NOTE — Telephone Encounter (Signed)
Patient requesting Shingles vaccine. She gets Actemra infusions every 4 weeks.  Next infusion is 12/31/16. She had the Zostavax over 5 years ago through Dr. Teofilo Pod office.

## 2016-12-31 DIAGNOSIS — M316 Other giant cell arteritis: Secondary | ICD-10-CM | POA: Diagnosis not present

## 2016-12-31 DIAGNOSIS — Z79899 Other long term (current) drug therapy: Secondary | ICD-10-CM | POA: Diagnosis not present

## 2017-01-02 NOTE — Telephone Encounter (Signed)
Patient's daughter notified and verbalized understanding. She stated that Halen has an appointment in November and will discuss with Dr at that time regarding administering Shingles vaccine if available.

## 2017-01-16 ENCOUNTER — Telehealth (INDEPENDENT_AMBULATORY_CARE_PROVIDER_SITE_OTHER): Payer: Self-pay | Admitting: Physical Medicine and Rehabilitation

## 2017-01-16 NOTE — Telephone Encounter (Signed)
Yes ok 

## 2017-01-17 ENCOUNTER — Encounter (HOSPITAL_BASED_OUTPATIENT_CLINIC_OR_DEPARTMENT_OTHER): Payer: Self-pay | Admitting: *Deleted

## 2017-01-17 ENCOUNTER — Emergency Department (HOSPITAL_BASED_OUTPATIENT_CLINIC_OR_DEPARTMENT_OTHER)
Admission: EM | Admit: 2017-01-17 | Discharge: 2017-01-17 | Disposition: A | Discharge status: Home / Self Care | Payer: Medicare Other | Attending: Emergency Medicine | Admitting: Emergency Medicine

## 2017-01-17 ENCOUNTER — Telehealth: Payer: Self-pay | Admitting: Family Medicine

## 2017-01-17 DIAGNOSIS — Y999 Unspecified external cause status: Secondary | ICD-10-CM | POA: Diagnosis not present

## 2017-01-17 DIAGNOSIS — Z85828 Personal history of other malignant neoplasm of skin: Secondary | ICD-10-CM | POA: Insufficient documentation

## 2017-01-17 DIAGNOSIS — S39012A Strain of muscle, fascia and tendon of lower back, initial encounter: Secondary | ICD-10-CM | POA: Insufficient documentation

## 2017-01-17 DIAGNOSIS — J45909 Unspecified asthma, uncomplicated: Secondary | ICD-10-CM | POA: Diagnosis not present

## 2017-01-17 DIAGNOSIS — Y929 Unspecified place or not applicable: Secondary | ICD-10-CM | POA: Diagnosis not present

## 2017-01-17 DIAGNOSIS — Z7982 Long term (current) use of aspirin: Secondary | ICD-10-CM | POA: Insufficient documentation

## 2017-01-17 DIAGNOSIS — Y939 Activity, unspecified: Secondary | ICD-10-CM | POA: Diagnosis not present

## 2017-01-17 DIAGNOSIS — Z79899 Other long term (current) drug therapy: Secondary | ICD-10-CM | POA: Diagnosis not present

## 2017-01-17 DIAGNOSIS — M545 Low back pain, unspecified: Secondary | ICD-10-CM

## 2017-01-17 DIAGNOSIS — I10 Essential (primary) hypertension: Secondary | ICD-10-CM | POA: Insufficient documentation

## 2017-01-17 DIAGNOSIS — W06XXXA Fall from bed, initial encounter: Secondary | ICD-10-CM | POA: Insufficient documentation

## 2017-01-17 DIAGNOSIS — G8929 Other chronic pain: Secondary | ICD-10-CM | POA: Insufficient documentation

## 2017-01-17 DIAGNOSIS — S3992XA Unspecified injury of lower back, initial encounter: Secondary | ICD-10-CM | POA: Diagnosis present

## 2017-01-17 DIAGNOSIS — T148XXA Other injury of unspecified body region, initial encounter: Secondary | ICD-10-CM

## 2017-01-17 MED ORDER — LIDOCAINE 5 % EX PTCH: 1.0000 | MEDICATED_PATCH | CUTANEOUS | 0 refills | Status: DC

## 2017-01-17 MED FILL — LIDOCAINE PATCH 5%: 5 | 30 days supply | Qty: 30 | Fill #0

## 2017-01-17 NOTE — ED Provider Notes (Signed)
[place] EMERGENCY DEPARTMENT Provider Note   CSN: [id number] Arrival date & time: 01/17/17  1234     History   Chief Complaint Chief Complaint  Patient presents with  . Back Pain    HPI [name] is a 83 y.o. female.  HPI  83 y.o. female, presents to the Emergency Department today due to fall x 5 days ago. Pt states she slid off of her bed with no impact to ground. States that she caught herself on the edge of the bed. No head trauma or LOC. Notes right sided back pain. States that this is typical chronic back pain for her. Denies pain currently. No numbness/tingling. No saddle anesthesia. No loss of bowel or bladder function. No headaches. No fevers. No dysuria. PT states that she tried to schedule an appointment with her Orthopedist, but has been having trouble getting an appointment due to reaching the voicemail. They told her to just come to the ER for evaluation. Pt unsure why she needed to come here. No other symptoms noted.    Past Medical History:  Diagnosis Date  . Abdominal bloating   . Abnormal LFTs (liver function tests)   . Anxiety disorder   . Arthritis   . Asthma   . Depression   . Diverticulosis   . Edema   . Fatty liver   . Fibromyalgia   . GERD (gastroesophageal reflux disease)   . Giant cell arteritis (HCC)    No biopsy secondary to steroid use  . Hx of adenomatous colonic polyps   . Hyperlipidemia   . Hypertension   . IBS (irritable bowel syndrome)   . Lumbar radiculitis   . Peptic ulcer disease   . Skin cancer    shoulder, leg, right eye (? squamous by resection)  . Thoracic radiculitis   . Thyroid nodule    "removed"  . Urinary incontinence   . Vertigo     Patient Active Problem List   Diagnosis Date Noted  . Fall 08/02/2016  . Frequent falls 08/02/2016  . Hypertensive retinopathy of both eyes 09/07/2015  . Medicare annual wellness visit, subsequent 07/11/2015  . Long-term use of high-risk medication 07/11/2015  .  Osteopenia 04/11/2015  . Depression with anxiety 04/11/2015  . Methotrexate, long term, current use 03/28/2015  . Giant cell arteritis ([place]) 03/28/2015  . Polymyalgia rheumatica ([place]) 03/16/2015  . Chronic narcotic use 03/16/2015  . Anemia, iron deficiency 07/14/2013  . Asthma, moderate persistent 10/15/2012  . Hyperlipidemia 12/27/2008  . HYPERTENSION, BENIGN 12/27/2008    Past Surgical History:  Procedure Laterality Date  . ABDOMINAL HYSTERECTOMY    . BACK SURGERY  2005   lower back  . EYE SURGERY Right 2006  . LIVER BIOPSY     fatty liver  . NASAL SEPTUM SURGERY    . right knee surgery  2006   arthroscopy  . right leg skin cancer surgery  2014  . right shoulder skin cancer excision  2012  . THYROIDECTOMY      OB History    No data available       Home Medications    Prior to Admission medications   Medication Sig Start Date End Date Taking? Authorizing Provider  albuterol (PROAIR HFA) 108 (90 Base) MCG/ACT inhaler 2 puffs up to every 4 hours if can't catch your breath 12/24/16   Kuneff, Renee A, DO  alendronate (FOSAMAX) 70 MG tablet Take 70 mg by mouth once a week. Take with a full glass of  water on an empty stomach.    [provider]  aspirin 81 MG tablet Take 81 mg by mouth daily.      [provider]  baclofen (LIORESAL) 10 MG tablet Take 1 tablet (10 mg total) by mouth 2 (two) times daily. 10/24/16   Kuneff, Renee A, DO  BENICAR 20 MG tablet TAKE 1 TABLET DAILY 09/11/16   Kuneff, Renee A, DO  budesonide-formoterol (SYMBICORT) 80-4.5 MCG/ACT inhaler Inhale 2 puffs into the lungs 2 (two) times daily. 12/24/16   Kuneff, Renee A, DO  diazepam (VALIUM) 5 MG tablet Take 1/2 tablet by mouth twice daily as needed for back spasms     [provider]  esomeprazole (NEXIUM) 40 MG capsule Take 40 mg by mouth daily.  10/14/12   Tanda Rockers, MD  famotidine (PEPCID) 20 MG tablet Take 20 mg by mouth at bedtime.    [provider]  ferrous  sulfate 325 (65 FE) MG tablet Take 1 tablet (325 mg total) by mouth 3 (three) times daily with meals. 07/14/13   Tanda Rockers, MD  FLUoxetine (PROZAC) 20 MG capsule Take 4 capsules (80 mg total) by mouth daily. 08/02/16   Kuneff, Renee A, DO  FLUoxetine (PROZAC) 20 MG capsule TAKE 4 CAPSULES DAILY 10/29/16   Kuneff, Renee A, DO  folic acid (FOLVITE) 1 MG tablet Take 1 mg by mouth daily.    [provider]  HYDROcodone-acetaminophen (NORCO/VICODIN) 5-325 MG tablet Take 1 tablet by mouth every 12 (twelve) hours as needed for moderate pain. 10/24/16   Kuneff, Renee A, DO  methotrexate (RHEUMATREX) 2.5 MG tablet Take 7.5 mg by mouth once a week. Caution:Chemotherapy. Protect from light.     [provider]  predniSONE (DELTASONE) 5 MG tablet Take 5 mg by mouth 2 (two) times daily. 07/22/16   [provider]    Family History Family History  Problem Relation Age of Onset  . Colon polyps Maternal Aunt   . Diabetes Father   . Heart disease Father   . Heart disease Mother   . Lung disease Mother   . COPD Mother   . Lung cancer Brother   . Cancer Brother        lung  . COPD Brother   . Stroke Brother   . Lung cancer Sister   . COPD Sister   . Kidney disease Unknown        niece-mat side  . Arthritis Unknown   . Diabetes Son   . Arthritis Daughter   . Colon cancer Neg Hx     Social History Social History  Substance Use Topics  . Smoking status: Never Smoker  . Smokeless tobacco: Never Used  . Alcohol use No     Allergies   Atorvastatin; Statins; Advair diskus [fluticasone-salmeterol]; and Sulfonamide derivatives   Review of Systems Review of Systems ROS reviewed and all are negative for acute change except as noted in the HPI.  Physical Exam Updated Vital Signs BP 108/88   Pulse 80   Temp 98.2 F (36.8 C) (Oral)   Resp 20   Ht 5\' 3"  (1.6 m)   Wt 89.8 kg (198 lb)   SpO2 97%   BMI 35.07 kg/m   Physical Exam  Constitutional: She is oriented to  person, place, and time. Vital signs are normal. She appears well-developed and well-nourished.  HENT:  Head: Normocephalic and atraumatic.  Right Ear: Hearing normal.  Left Ear: Hearing normal.  Eyes: Pupils are  equal, round, and reactive to light. Conjunctivae and EOM are normal.  Neck: Normal range of motion. Neck supple.  Cardiovascular: Normal rate, regular rhythm, normal heart sounds and intact distal pulses.   Pulmonary/Chest: Effort normal and breath sounds normal.  Musculoskeletal:  TTP right lumbar musculature. No midline C/T/L tenderness   Neurological: She is alert and oriented to person, place, and time. She has normal strength. No sensory deficit.  BLE unremarkable exam. No motor/sensory deficits. Ambulates well  Skin: Skin is warm and dry.  Psychiatric: She has a normal mood and affect. Her speech is normal and behavior is normal. Thought content normal.  Nursing note and vitals reviewed.    ED Treatments / Results  Labs (all labs ordered are listed, but only abnormal results are displayed) Labs Reviewed - No data to display  EKG  EKG Interpretation None       Radiology No results found.  Procedures Procedures (including critical care time)  Medications Ordered in ED Medications - No data to display   Initial Impression / Assessment and Plan / ED Course  I have reviewed the triage vital signs and the nursing notes.  Pertinent labs & imaging results that were available during my care of the patient were reviewed by me and considered in my medical decision making (see chart for details).  Final Clinical Impressions(s) / ED Diagnoses     {I have reviewed the relevant previous healthcare records.  {I obtained HPI from historian. {Patient discussed with supervising physician.  ED Course:  Assessment: Patient is a 83 y.o. female  presents to the Emergency Department today due to fall x 5 days ago. Pt states she slid off of her bed with no impact to ground.  States that she caught herself on the edge of the bed. No head trauma or LOC. Notes right sided back pain. States that this is typical chronic back pain for her. Denies pain currently. No numbness/tingling. No saddle anesthesia. No loss of bowel or bladder function. No headaches. No fevers. No dysuria. PT states that she tried to schedule an appointment with her Orthopedist, but has been having trouble getting an appointment due to reaching the voicemail. They told her to just come to the ER for evaluation. Pt unsure why she needed to come here. No neurological deficits appreciated. Patient is ambulatory. No warning symptoms of back pain including: fecal incontinence, urinary retention or overflow incontinence, night sweats, waking from sleep with back pain, unexplained fevers or weight loss, h/o cancer, IVDU, recent trauma. No concern for cauda equina, epidural abscess, or other serious cause of back pain. Conservative measures such as rest, ice/heat and pain medicine indicated with PCP follow-up if no improvement with conservative management.  Disposition/Plan:  DC Home Additional Verbal discharge instructions given and discussed with patient.  Pt Instructed to f/u with PCP in the next week for evaluation and treatment of symptoms. Return precautions given Pt acknowledges and agrees with plan  Supervising Physician Davonna Belling, MD  Final diagnoses:  Acute right-sided low back pain without sciatica  Muscle strain    New Prescriptions New Prescriptions   No medications on file     Shary Decamp, Hershal Coria 01/17/17 [place], Nathan, MD 01/17/17 [phone]

## 2017-01-17 NOTE — ED Triage Notes (Signed)
Hx of chronic back pain. She slipped off the bed 5 days ago. She has had increased pain in her back since that time. Her daughter gave her a Vicodin this am.

## 2017-01-17 NOTE — Discharge Instructions (Signed)
Please read and follow all provided instructions.  Your diagnoses today include:  1. Acute right-sided low back pain without sciatica   2. Muscle strain     Tests performed today include: Vital signs - see below for your results today  Medications prescribed:   Take any prescribed medications only as directed.  Home care instructions:  Follow any educational materials contained in this packet Please rest, use ice or heat on your back for the next several days Do not lift, push, pull anything more than 10 pounds for the next week  Follow-up instructions: Please follow-up with Dr Ernestina Patches in the next 1 week for further evaluation of your symptoms.   Return instructions:  SEEK IMMEDIATE MEDICAL ATTENTION IF YOU HAVE: New numbness, tingling, weakness, or problem with the use of your arms or legs Severe back pain not relieved with medications Loss control of your bowels or bladder Increasing pain in any areas of the body (such as chest or abdominal pain) Shortness of breath, dizziness, or fainting.  Worsening nausea (feeling sick to your stomach), vomiting, fever, or sweats Any other emergent concerns regarding your health   Additional Information:  Your vital signs today were: BP 108/88    Pulse 80    Temp 98.2 F (36.8 C) (Oral)    Resp 20    Ht 5\' 3"  (1.6 m)    Wt 89.8 kg (198 lb)    SpO2 97%    BMI 35.07 kg/m  If your blood pressure (BP) was elevated above 135/85 this visit, please have this repeated by your doctor within one month. --------------

## 2017-01-17 NOTE — Telephone Encounter (Signed)
If it helped and no new changes then ok but otherwise and may need to send message to Dr. Louanne Skye

## 2017-01-17 NOTE — Telephone Encounter (Signed)
Patient Name: MAANYA HIPPERT  DOB: 1929-10-22    Initial Comment Caller says her mother says has been waking up with back pain and numbness on her legs for 2 days, but is worse today. PT had back surgery years ago and has rods on her back. And has had a few falls this year; including 2 days ago.    Nurse Assessment  Nurse: Sherrell Puller, RN, Amy Date/Time Eilene Ghazi Time): 01/17/2017 11:37:39 AM  Confirm and document reason for call. If symptomatic, describe symptoms. ---Caller states her mother has been waking up with back pain and numbness in the right leg. Back pain is worse today, mom is saying she can't take it anymore. History of back surgery, rods in the back. Headaches for 2-3 days. Had a fall a couple days ago but not a hard fall, slid down out of bed and onto the floor-symptoms got worse after this.  Does the patient have any new or worsening symptoms? ---Yes  Will a triage be completed? ---Yes  Related visit to physician within the last 2 weeks? ---No  Does the PT have any chronic conditions? (i.e. diabetes, asthma, etc.) ---Yes  List chronic conditions. ---Chronic back pain. Polymyalgia rheumatica, Fibromyalgia, HTN  Is this a behavioral health or substance abuse call? ---No     Guidelines    Guideline Title Affirmed Question Affirmed Notes  Neurologic Deficit [1] SEVERE weakness (i.e., unable to walk or barely able to walk, requires support) AND [2] new onset or worsening    Final Disposition User   Call EMS 911 Now Sherrell Puller, RN, Amy    Referrals  [facility] - ED   Caller Disagree/Comply Disagree  Caller Understands Yes  PreDisposition Did not know what to do

## 2017-01-17 NOTE — Telephone Encounter (Signed)
Left message #1

## 2017-01-17 NOTE — Telephone Encounter (Signed)
Called and left message.

## 2017-01-17 NOTE — Telephone Encounter (Signed)
Patient's daughter called and said her mother is now having severe pain and new right leg numbness. Ok to proceed with scheduled right L2 Tf as planned?

## 2017-01-20 NOTE — Telephone Encounter (Signed)
Patient's daughter called back and left a message. I called and left a message scheduling the patient for Thursday 11/8 at [place] and asked her daughter to call back and confirm.

## 2017-01-20 NOTE — Telephone Encounter (Signed)
Patient's daughter called back Friday. I called her and left another message. She took her mother to the ED and called me from there as well as having the provider call me and leave a message requesting an appointment. I called her back this morning and had to leave another message for her to call back so we can get the patient scheduled.

## 2017-01-20 NOTE — Telephone Encounter (Signed)
Appointment confirmed

## 2017-01-23 ENCOUNTER — Ambulatory Visit (INDEPENDENT_AMBULATORY_CARE_PROVIDER_SITE_OTHER): Payer: Self-pay

## 2017-01-23 ENCOUNTER — Ambulatory Visit (INDEPENDENT_AMBULATORY_CARE_PROVIDER_SITE_OTHER): Payer: Medicare Other | Admitting: Physical Medicine and Rehabilitation

## 2017-01-23 VITALS — BP 140/64 | HR 83 | Temp 98.5°F

## 2017-01-23 DIAGNOSIS — M5416 Radiculopathy, lumbar region: Secondary | ICD-10-CM | POA: Diagnosis not present

## 2017-01-23 MED ORDER — BETAMETHASONE SOD PHOS & ACET 6 (3-3) MG/ML IJ SUSP
12.0000 mg | Freq: Once | INTRAMUSCULAR | Status: AC
  Administered 2017-01-23: 12 mg

## 2017-01-23 MED ORDER — LIDOCAINE HCL (PF) 1 % IJ SOLN
2.0000 mL | Freq: Once | INTRAMUSCULAR | Status: AC
  Administered 2017-01-23: 2 mL

## 2017-01-23 NOTE — Progress Notes (Deleted)
Patient here today for a repeat injection due to her back pain she is having which radiates down her right leg. She is on a baby aspirin a day. Her daughter is her driver.   83 y.o. female, presents to the Emergency Department today due to fall x 5 days ago. Pt states she slid off of her bed with no impact to ground. States that she caught herself on the edge of the bed. No head trauma or LOC. Notes right sided back pain. States that this is typical chronic back pain for her. Denies pain currently. No numbness/tingling. No saddle anesthesia. No loss of bowel or bladder function. No headaches. No fevers. No dysuria. PT states that she tried to schedule an appointment with her Orthopedist, but has been having trouble getting an appointment due to reaching the voicemail. They told her to just come to the ER for evaluation. Pt unsure why she needed to come here. No other symptoms noted.

## 2017-01-23 NOTE — Patient Instructions (Signed)

## 2017-01-24 DIAGNOSIS — M316 Other giant cell arteritis: Secondary | ICD-10-CM | POA: Diagnosis not present

## 2017-01-24 DIAGNOSIS — Z79899 Other long term (current) drug therapy: Secondary | ICD-10-CM | POA: Diagnosis not present

## 2017-01-27 ENCOUNTER — Encounter (INDEPENDENT_AMBULATORY_CARE_PROVIDER_SITE_OTHER): Payer: Self-pay | Admitting: Physical Medicine and Rehabilitation

## 2017-01-27 NOTE — Progress Notes (Signed)
[name] - 83 y.o. female MRN [id number]  Date of birth: [birth year]  Office Visit Note: Visit Date: 01/23/2017 PCP: Ma Hillock, DO Referred by: Ma Hillock, DO  Subjective: Chief Complaint  Patient presents with  . Lower Back - Pain  . Right Hip - Pain  . Right Leg - Pain, Numbness   HPI: [name] is an 83 year old female with prior lumbar fusion from L3 to the sacrum.  She is a patient of Dr. Otho Ket.  I last saw her in April of this year and completed a right L2 transforaminal epidural steroid injection at his request.  [name] is present with her daughter who provides some of the history.  They felt like it had been over a year since they had been seen by me.  We have records that they phoned in on November 1 the patient wanted to have an injection in the "same spot as before ".  There was no mention at the time of any trauma.  Fact, my response is always if there is trauma to necessarily be seen by the referring physician unless we are seeing them first-hand basis.  Nonetheless there seems to have been some confusion with the patient's daughter's voicemail.  Ultimately the patient was taken to the emergency department on the second of the month.  At that point it was discussed that she had slipped off the bed in the latter part of October but sustained no fall.  She did catch herself.  She had increased pain.  Message sent to [place] was that she had severe pain with numbness and tingling in the right leg.  Reviewing the notes from the emergency department, Dr. Alvino Chapel stated that she did not have much pain and that the patient did not know why she was at the emergency department.  He also commented that our office told her that she just needed to come there for evaluation.  If she had been in severe acute pain would have told her that there just seems to be some confusion.  Today the patient is in significant pain on the right she has pain down the leg.  This is very similar to  what she has had for many years.  She has no left-sided complaints.  He does feel weak in the right leg.  She has had some level of weakness in the right foot chronically.  Dr. Louanne Skye has ordered an AFO but the patient does not have an AFO at this point.  Again she had a slip from a bed but no fall she did not hit anything.  The emergency department did not do any images.  She was prescribed Lidoderm patches and says those have helped and done wonders.    Review of Systems  Constitutional: Negative for chills, fever, malaise/fatigue and weight loss.  HENT: Negative for hearing loss and sinus pain.   Eyes: Negative for blurred vision, double vision and photophobia.  Respiratory: Negative for cough and shortness of breath.   Cardiovascular: Negative for chest pain, palpitations and leg swelling.  Gastrointestinal: Negative for abdominal pain, nausea and vomiting.  Genitourinary: Negative for flank pain.  Musculoskeletal: Positive for back pain. Negative for myalgias.       Right hip and leg pain  Skin: Negative for itching and rash.  Neurological: Positive for tingling. Negative for tremors, focal weakness and weakness.  Endo/Heme/Allergies: Negative.   Psychiatric/Behavioral: Negative for depression.  All other systems reviewed and are negative.  Otherwise per  HPI.  Assessment & Plan: Visit Diagnoses:  1. Lumbar radiculopathy     Plan: Findings:  Chronic pain syndrome and chronic history of low back and right hip and leg pain.  She follows with Dr. Louanne Skye with prior L3-S1 fusion.  She has had no recent images.  Last imaging performed other than x-ray was CT of the lumbar spine and discogram in 2009.  This is reviewed again below.  She had a prior L2 transforaminal injection with decent relief back in April.  He has had recent increase of pain.  Seems to be helping with Lidoderm patch which may indicate that it is more of a muscular spasm.  She is getting radicular pain in the leg.  There are no  other red flag symptoms and her exam is fairly benign chronic.  I feel like her pain is really complicated by issues with depression and anxiety and fibromyalgia.  we will go ahead today and repeat a right L2 transforaminal epidural steroid injection.  We will get her an appointment to see Dr. Merrilee Seashore in follow-up.    Meds & Orders:  Meds ordered this encounter  Medications  . lidocaine (PF) (XYLOCAINE) 1 % injection 2 mL  . betamethasone acetate-betamethasone sodium phosphate (CELESTONE) injection 12 mg    Orders Placed This Encounter  Procedures  . XR C-ARM NO REPORT  . Epidural Steroid injection    Follow-up: Return in about 4 weeks (around 02/20/2017) for Dr. Louanne Skye.   Procedures: No procedures performed  Lumbosacral Transforaminal Epidural Steroid Injection - Sub-Pedicular Approach with Fluoroscopic Guidance  Patient: [name]      Date of Birth: November 01, 1929 MRN: [id number] PCP: Ma Hillock, DO      Visit Date: 01/23/2017   Universal Protocol:    Date/Time: 01/23/2017  Consent Given By: the patient  Position: PRONE  Additional Comments: Vital signs were monitored before and after the procedure. Patient was prepped and draped in the usual sterile fashion. The correct patient, procedure, and site was verified.   Injection Procedure Details:  Procedure Site One Meds Administered:  Meds ordered this encounter  Medications  . lidocaine (PF) (XYLOCAINE) 1 % injection 2 mL  . betamethasone acetate-betamethasone sodium phosphate (CELESTONE) injection 12 mg    Laterality: Right  Location/Site:  L2-L3  Needle size: 22 G  Needle type: Spinal  Needle Placement: Transforaminal  Findings:  -Contrast Used: 0.5 mL iohexol 180 mg iodine/mL   -Comments: Excellent flow of contrast along the nerve and into the epidural space.  Procedure Details: After squaring off the end-plates to get a true AP view, the C-arm was positioned so that an oblique view of the foramen as  noted above was visualized. The target area is just inferior to the "nose of the scotty dog" or sub pedicular. The soft tissues overlying this structure were infiltrated with 2-3 ml. of 1% Lidocaine without Epinephrine.  The spinal needle was inserted toward the target using a "trajectory" view along the fluoroscope beam.  Under AP and lateral visualization, the needle was advanced so it did not puncture dura and was located close the 6 O'Clock position of the pedical in AP tracterory. Biplanar projections were used to confirm position. Aspiration was confirmed to be negative for CSF and/or blood. A 1-2 ml. volume of Isovue-250 was injected and flow of contrast was noted at each level. Radiographs were obtained for documentation purposes.   After attaining the desired flow of contrast documented above, a 0.5 to 1.0 ml test dose  of 0.25% Marcaine was injected into each respective transforaminal space.  The patient was observed for 90 seconds post injection.  After no sensory deficits were reported, and normal lower extremity motor function was noted,   the above injectate was administered so that equal amounts of the injectate were placed at each foramen (level) into the transforaminal epidural space.   Additional Comments:  The patient tolerated the procedure well Dressing: Band-Aid    Post-procedure details: Patient was observed during the procedure. Post-procedure instructions were reviewed.  Patient left the clinic in stable condition.       Clinical History: 03/23/2007 CT Lumbar Discogram IMPRESSION:  1. Provocative diskography L1-2, L2-3. L1-2 utilized as a control and normal in appearance with no pain response.  2. L2-3 does provoke a concordant pain response reproducing her back pain.  POST DISKOGRAM CT SCAN OF THE LUMBAR SPINE:  Technique: Multidetector CT imaging of the lumbar spine was performed after intradiskal injection of contrast. Multiplanar CT image reconstructions were  also generated.  Findings: The reformatted images show fusion L3-4-5-S1 with posterior pedicle screw plate fixation. Interbody spacers are seen at L4-5 and L5-S1 which appear well-positioned. There is incomplete bony incorporation of the these. Marked disk height loss at L3-4 with minimal anterolisthesis.  L1-2: Contrast collects centrally within the nucleus. No central nor foraminal stenosis.  L2-3: Advanced degenerative disk disease. Disk height loss with diffuse disk bulge and diffuse broad annular tear. Posterior osseous ridging. There is narrowing of the inferior foramina bilaterally without encroachment on the exiting nerve roots.  IMPRESSION:  1. Fusion L3-4-5-S1 intact.  2. L2-3 advanced degenerative disk disease with a broad annular tear eccentric right. Narrowing of the inferior foramina bilaterally without definite encroachment on the nerve roots.  She reports that  has never smoked. she has never used smokeless tobacco. No results for input(s): HGBA1C, LABURIC in the last 8760 hours.  Objective:  VS:  HT:    WT:   BMI:     BP:140/64  HR:83bpm  TEMP:98.5 F (36.9 C)( )  RESP:92 % Physical Exam  Constitutional: She is oriented to person, place, and time. She appears well-developed and well-nourished. No distress.  HENT:  Head: Normocephalic and atraumatic.  Nose: Nose normal.  Mouth/Throat: Oropharynx is clear and moist.  Eyes: Conjunctivae are normal. Pupils are equal, round, and reactive to light.  Neck: Normal range of motion. Neck supple.  Cardiovascular: Regular rhythm and intact distal pulses.  Pulmonary/Chest: Effort normal. No respiratory distress.  Abdominal: She exhibits no distension. There is no guarding.  Musculoskeletal:  Patient is seated in a wheelchair in the office.  She does normal from sit to stand is painful.  The patient is very anxious with movement.  It is somewhat guarded does have some weakness with dorsiflexion.  Neurological: She is alert and  oriented to person, place, and time. She exhibits normal muscle tone. Coordination normal.  Skin: Skin is warm. No rash noted. No erythema.  Psychiatric: She has a normal mood and affect. Her behavior is normal.  Nursing note and vitals reviewed.   Ortho Exam Imaging: No results found.  Past Medical/Family/Surgical/Social History: Medications & Allergies reviewed per EMR Patient Active Problem List   Diagnosis Date Noted  . Fall 08/02/2016  . Frequent falls 08/02/2016  . Hypertensive retinopathy of both eyes 09/07/2015  . Medicare annual wellness visit, subsequent 07/11/2015  . Long-term use of high-risk medication 07/11/2015  . Osteopenia 04/11/2015  . Depression with anxiety 04/11/2015  . Methotrexate, long  term, current use 03/28/2015  . Giant cell arteritis ([place]) 03/28/2015  . Polymyalgia rheumatica ([place]) 03/16/2015  . Chronic narcotic use 03/16/2015  . Anemia, iron deficiency 07/14/2013  . Asthma, moderate persistent 10/15/2012  . Hyperlipidemia 12/27/2008  . HYPERTENSION, BENIGN 12/27/2008   Past Medical History:  Diagnosis Date  . Abdominal bloating   . Abnormal LFTs (liver function tests)   . Anxiety disorder   . Arthritis   . Asthma   . Depression   . Diverticulosis   . Edema   . Fatty liver   . Fibromyalgia   . GERD (gastroesophageal reflux disease)   . Giant cell arteritis ([facility])    No biopsy secondary to steroid use  . Hx of adenomatous colonic polyps   . Hyperlipidemia   . Hypertension   . IBS (irritable bowel syndrome)   . Lumbar radiculitis   . Peptic ulcer disease   . Skin cancer    shoulder, leg, right eye (? squamous by resection)  . Thoracic radiculitis   . Thyroid nodule    "removed"  . Urinary incontinence   . Vertigo    Family History  Problem Relation Age of Onset  . Colon polyps Maternal Aunt   . Diabetes Father   . Heart disease Father   . Heart disease Mother   . Lung disease Mother   . COPD Mother   . Lung cancer Brother   .  Cancer Brother        lung  . COPD Brother   . Stroke Brother   . Lung cancer Sister   . COPD Sister   . Kidney disease Unknown        niece-mat side  . Arthritis Unknown   . Diabetes Son   . Arthritis Daughter   . Colon cancer Neg Hx    Past Surgical History:  Procedure Laterality Date  . ABDOMINAL HYSTERECTOMY    . BACK SURGERY  2005   lower back  . EYE SURGERY Right 2006  . LIVER BIOPSY     fatty liver  . NASAL SEPTUM SURGERY    . right knee surgery  2006   arthroscopy  . right leg skin cancer surgery  2014  . right shoulder skin cancer excision  2012  . THYROIDECTOMY     Social History   Occupational History  . Occupation: Retired Water engineer  Tobacco Use  . Smoking status: Never Smoker  . Smokeless tobacco: Never Used  Substance and Sexual Activity  . Alcohol use: No  . Drug use: No  . Sexual activity: No

## 2017-01-27 NOTE — Procedures (Signed)
Lumbosacral Transforaminal Epidural Steroid Injection - Sub-Pedicular Approach with Fluoroscopic Guidance  Patient: [name]      Date of Birth: February 19, 1930 MRN: [id number] PCP: Ma Hillock, DO      Visit Date: 01/23/2017   Universal Protocol:    Date/Time: 01/23/2017  Consent Given By: the patient  Position: PRONE  Additional Comments: Vital signs were monitored before and after the procedure. Patient was prepped and draped in the usual sterile fashion. The correct patient, procedure, and site was verified.   Injection Procedure Details:  Procedure Site One Meds Administered:  Meds ordered this encounter  Medications  . lidocaine (PF) (XYLOCAINE) 1 % injection 2 mL  . betamethasone acetate-betamethasone sodium phosphate (CELESTONE) injection 12 mg    Laterality: Right  Location/Site:  L2-L3  Needle size: 22 G  Needle type: Spinal  Needle Placement: Transforaminal  Findings:  -Contrast Used: 0.5 mL iohexol 180 mg iodine/mL   -Comments: Excellent flow of contrast along the nerve and into the epidural space.  Procedure Details: After squaring off the end-plates to get a true AP view, the C-arm was positioned so that an oblique view of the foramen as noted above was visualized. The target area is just inferior to the "nose of the scotty dog" or sub pedicular. The soft tissues overlying this structure were infiltrated with 2-3 ml. of 1% Lidocaine without Epinephrine.  The spinal needle was inserted toward the target using a "trajectory" view along the fluoroscope beam.  Under AP and lateral visualization, the needle was advanced so it did not puncture dura and was located close the 6 O'Clock position of the pedical in AP tracterory. Biplanar projections were used to confirm position. Aspiration was confirmed to be negative for CSF and/or blood. A 1-2 ml. volume of Isovue-250 was injected and flow of contrast was noted at each level. Radiographs were obtained for  documentation purposes.   After attaining the desired flow of contrast documented above, a 0.5 to 1.0 ml test dose of 0.25% Marcaine was injected into each respective transforaminal space.  The patient was observed for 90 seconds post injection.  After no sensory deficits were reported, and normal lower extremity motor function was noted,   the above injectate was administered so that equal amounts of the injectate were placed at each foramen (level) into the transforaminal epidural space.   Additional Comments:  The patient tolerated the procedure well Dressing: Band-Aid    Post-procedure details: Patient was observed during the procedure. Post-procedure instructions were reviewed.  Patient left the clinic in stable condition.

## 2017-01-28 ENCOUNTER — Ambulatory Visit (INDEPENDENT_AMBULATORY_CARE_PROVIDER_SITE_OTHER): Payer: Medicare Other | Admitting: Family Medicine

## 2017-01-28 ENCOUNTER — Encounter: Payer: Self-pay | Admitting: Family Medicine

## 2017-01-28 VITALS — BP 142/70 | HR 86 | Temp 98.2°F | Resp 20 | Wt 195.0 lb

## 2017-01-28 DIAGNOSIS — K279 Peptic ulcer, site unspecified, unspecified as acute or chronic, without hemorrhage or perforation: Secondary | ICD-10-CM | POA: Insufficient documentation

## 2017-01-28 DIAGNOSIS — M549 Dorsalgia, unspecified: Secondary | ICD-10-CM

## 2017-01-28 DIAGNOSIS — G8929 Other chronic pain: Secondary | ICD-10-CM | POA: Insufficient documentation

## 2017-01-28 DIAGNOSIS — M5416 Radiculopathy, lumbar region: Secondary | ICD-10-CM | POA: Insufficient documentation

## 2017-01-28 DIAGNOSIS — E782 Mixed hyperlipidemia: Secondary | ICD-10-CM | POA: Diagnosis not present

## 2017-01-28 DIAGNOSIS — M316 Other giant cell arteritis: Secondary | ICD-10-CM | POA: Diagnosis not present

## 2017-01-28 DIAGNOSIS — M353 Polymyalgia rheumatica: Secondary | ICD-10-CM

## 2017-01-28 DIAGNOSIS — M545 Low back pain, unspecified: Secondary | ICD-10-CM

## 2017-01-28 DIAGNOSIS — E611 Iron deficiency: Secondary | ICD-10-CM | POA: Insufficient documentation

## 2017-01-28 DIAGNOSIS — J4540 Moderate persistent asthma, uncomplicated: Secondary | ICD-10-CM | POA: Diagnosis not present

## 2017-01-28 DIAGNOSIS — H35033 Hypertensive retinopathy, bilateral: Secondary | ICD-10-CM

## 2017-01-28 DIAGNOSIS — I10 Essential (primary) hypertension: Secondary | ICD-10-CM

## 2017-01-28 DIAGNOSIS — Z79899 Other long term (current) drug therapy: Secondary | ICD-10-CM | POA: Diagnosis not present

## 2017-01-28 LAB — BASIC METABOLIC PANEL
BUN: 17 mg/dL (ref 6–23)
CO2: 28 meq/L (ref 19–32)
Calcium: 9.9 mg/dL (ref 8.4–10.5)
Chloride: 102 mEq/L (ref 96–112)
Creatinine, Ser: 0.84 mg/dL (ref 0.40–1.20)
GFR: 68.39 mL/min (ref >60.00)
GLUCOSE: 139 mg/dL — AB (ref 70–99)
POTASSIUM: 4.5 meq/L (ref 3.5–5.1)
SODIUM: 140 meq/L (ref 135–145)

## 2017-01-28 LAB — CBC
HCT: 47.7 % — ABNORMAL HIGH (ref 36.0–46.0)
Hemoglobin: 15.5 g/dL — ABNORMAL HIGH (ref 12.0–15.0)
MCHC: 32.5 g/dL (ref 30.0–36.0)
MCV: 100.1 fl — AB (ref 78.0–100.0)
PLATELETS: 247 10*3/uL (ref 150.0–400.0)
RBC: 4.76 Mil/uL (ref 3.87–5.11)
RDW: 13.9 % (ref 11.5–15.5)
WBC: 15.1 10*3/uL — AB (ref 4.0–10.5)

## 2017-01-28 LAB — TSH: TSH: 1.68 u[IU]/mL (ref 0.35–4.50)

## 2017-01-28 MED ORDER — LIDOCAINE 5 % EX PTCH: 1.0000 | MEDICATED_PATCH | CUTANEOUS | 11 refills | Status: DC

## 2017-01-28 MED ORDER — FLUOXETINE HCL 20 MG PO CAPS: 60.0000 mg | ORAL_CAPSULE | Freq: Every day | ORAL | 1 refills | Status: DC

## 2017-01-28 MED ORDER — FLUOXETINE HCL 20 MG PO CAPS: 60.0000 mg | ORAL_CAPSULE | Freq: Every day | ORAL | 0 refills | Status: DC

## 2017-01-28 MED ORDER — OLMESARTAN MEDOXOMIL 20 MG PO TABS: 30.0000 mg | ORAL_TABLET | Freq: Every day | ORAL | 1 refills | Status: DC

## 2017-01-28 NOTE — Patient Instructions (Signed)
Decrease prozac to 60 mg a day (3 tabs). A script was prescribed to [facility] for 30d and [facility] script for 90d.   Increase benicar to 1.5 tabs a day (30 mg).   We will call you with lab results once available.   Follow in 6 months as long as doing well. Sooner if needed.

## 2017-01-28 NOTE — Progress Notes (Signed)
[name] , Mar 09, 1930, 83 y.o., female MRN: [id number] Patient Care Team    Relationship Specialty Notifications Start End  Ma Hillock, DO PCP - General Family Medicine  03/16/15   Hermelinda Medicus, MD  Internal Medicine  07/11/15   Fay Records, MD Consulting Physician Cardiology  07/11/15   Lafayette Dragon, MD (Inactive)  Gastroenterology  07/11/15   Tanda Rockers, MD Consulting Physician Pulmonary Disease  07/11/15   Jessy Oto, MD Consulting Physician Orthopedic Surgery  07/12/16   Magnus Sinning, MD Consulting Physician Physical Medicine and Rehabilitation  07/12/16     Chief Complaint  Patient presents with  . Hypertension  . Depression    Subjective:  Hypertension/iron deficiency/hyperlipidemia: Pt reports compliance with Benicar 20 mg QD. Blood pressures ranges at home not routinely checked. Patient denies chest pain, shortness of breath or lower extremity edema. Pt coughing daily baby ASA. Pt is intolerant of statin. BMP: Collected today CBC: Collected today Diet: Low-sodium Exercise: Not routinely RF: Hypertension, hyperlipidemia, obesity, Fhx HD  Depression with anxiety: Patient reports she is doing well. Prozac 80 mg daily. She is much better situation than she had when Prozac was increased. She is agreeable to try decreased dose today. She continues to take the Valium 2.5 mg occasionally.  She feels she is finally getting over or at least coping better with the stress surrounding her husband. Her family is very supportive for her, and her daughter lives next to her.  Asthma: Patient reports compliance Symbicort 2 puffs twice a day. She really needs her albuterol. She feeling much better has more energy breathing better.   Chronic back pain/lumbar radiculitis: She recently had an injection of her lumbar spine for chronic low back pain that was exacerbated by recent fall. She does not feel the injection worked as well as it had in the past. She has an  appointment with orthopedics today. She has been use the Lidoderm 5% patches and has given some relief.  Depression screen [facility] 2/9 01/28/2017 07/12/2016 07/11/2015 03/16/2015  Decreased Interest 0 0 0 0  Down, Depressed, Hopeless 0 0 0 0  PHQ - 2 Score 0 0 0 0  Altered sleeping 1 - - -  Tired, decreased energy 1 - - -  Change in appetite 0 - - -  Feeling bad or failure about yourself  0 - - -  Trouble concentrating 0 - - -  Suicidal thoughts 0 - - -  PHQ-9 Score 2 - - -    Allergies  Allergen Reactions  . Atorvastatin Other (See Comments)    Severe headache  . Statins   . Advair Diskus [Fluticasone-Salmeterol] Other (See Comments)    Mouth sores  . Sulfonamide Derivatives Rash   Social History   Tobacco Use  . Smoking status: Never Smoker  . Smokeless tobacco: Never Used  Substance Use Topics  . Alcohol use: No   Past Medical History:  Diagnosis Date  . Abdominal bloating   . Abnormal LFTs (liver function tests)   . Anxiety disorder   . Arthritis   . Asthma   . Depression   . Diverticulosis   . Edema   . Fatty liver   . Fibromyalgia   . GERD (gastroesophageal reflux disease)   . Giant cell arteritis (HCC)    No biopsy secondary to steroid use  . Hx of adenomatous colonic polyps   . Hyperlipidemia   . Hypertension   . IBS (irritable bowel syndrome)   .  Lumbar radiculitis   . Peptic ulcer disease   . Skin cancer    shoulder, leg, right eye (? squamous by resection)  . Thoracic radiculitis   . Thyroid nodule    "removed"  . Urinary incontinence   . Vertigo    Past Surgical History:  Procedure Laterality Date  . ABDOMINAL HYSTERECTOMY    . BACK SURGERY  2005   lower back  . EYE SURGERY Right 2006  . LIVER BIOPSY     fatty liver  . NASAL SEPTUM SURGERY    . right knee surgery  2006   arthroscopy  . right leg skin cancer surgery  2014  . right shoulder skin cancer excision  2012  . THYROIDECTOMY     Family History  Problem Relation Age of Onset  .  Colon polyps Maternal Aunt   . Diabetes Father   . Heart disease Father   . Heart disease Mother   . Lung disease Mother   . COPD Mother   . Lung cancer Brother   . Cancer Brother        lung  . COPD Brother   . Stroke Brother   . Lung cancer Sister   . COPD Sister   . Kidney disease Unknown        niece-mat side  . Arthritis Unknown   . Diabetes Son   . Arthritis Daughter   . Colon cancer Neg Hx    Allergies as of 01/28/2017      Reactions   Atorvastatin Other (See Comments)   Severe headache   Statins    Advair Diskus [fluticasone-salmeterol] Other (See Comments)   Mouth sores   Sulfonamide Derivatives Rash      Medication List        Accurate as of 01/28/17 11:03 AM. Always use your most recent med list.          albuterol 108 (90 Base) MCG/ACT inhaler Commonly known as:  PROAIR HFA 2 puffs up to every 4 hours if can't catch your breath   alendronate 70 MG tablet Commonly known as:  FOSAMAX Take 70 mg by mouth once a week. Take with a full glass of water on an empty stomach.   aspirin 81 MG tablet Take 81 mg by mouth daily.   BENICAR 20 MG tablet Generic drug:  olmesartan TAKE 1 TABLET DAILY   budesonide-formoterol 80-4.5 MCG/ACT inhaler Commonly known as:  SYMBICORT Inhale 2 puffs into the lungs 2 (two) times daily.   diazepam 5 MG tablet Commonly known as:  VALIUM Take 1/2 tablet by mouth twice daily as needed for back spasms   ferrous sulfate 325 (65 FE) MG tablet Take 1 tablet (325 mg total) by mouth 3 (three) times daily with meals.   FLUoxetine 20 MG capsule Commonly known as:  PROZAC Take 4 capsules (80 mg total) by mouth daily.   lidocaine 5 % Commonly known as:  LIDODERM Place 1 patch onto the skin daily. Remove & Discard patch within 12 hours or as directed by MD   predniSONE 5 MG tablet Commonly known as:  DELTASONE Take 7.5 mg 2 (two) times daily by mouth.   tocilizumab 4 mg/kg in sodium chloride 0.9 % Inject 280 mg into the  vein.       No results found for this or any previous visit (from the past 24 hour(s)). No results found.   ROS: Negative, with the exception of above mentioned in HPI   Objective:  BP Marland Kitchen)  145/75 (BP Location: Right Arm, Patient Position: Sitting, Cuff Size: Large)   Pulse 86   Temp 98.2 F (36.8 C)   Resp 20   Wt 195 lb (88.5 kg)   SpO2 96%   BMI 34.54 kg/m  Body mass index is 34.54 kg/m. Gen: Afebrile. No acute distress. Nontoxic in appearance, well-developed, well-nourished obese Caucasian female. HENT: AT. [place].  MMM.  Eyes:Pupils Equal Round Reactive to light, Extraocular movements intact,  Conjunctiva without redness, discharge or icterus. CV: RRR no murmur,  no edema, +2/4 P posterior tibialis pulses Chest: CTAB, no wheeze or crackles Abd: Soft. NTND. BS present. No Masses palpated.  Skin: No rashes, purpura or petechiae.  Neuro: Walks with daughter for support. PERLA. EOMi. Alert. Oriented x3  Psych: Normal affect, dress and demeanor. Normal speech. Normal thought content and judgment.   Assessment/Plan: [name] is a 83 y.o. female present for acute OV for  Essential hypertension, benign - Blood pressures mildly elevated above goal consistent with prior readings. Discussed this with her and her daughter today. Will increase to Benicar 30 mg daily.  - Refills provided for 6 months today. - Low-sodium diet - Attempt to stay as active as possible - Continue baby aspirin - Intolerant to statins - CBC, iron panel, TSH and BMP collected. - Follow-up 6 months  Moderate persistent asthma without complication Stable. Doing rather well on Symbicort 2 puffs twice a day.  Continue current regimen. Refills recently provided.  Giant cell arteritis (HCC) Polymyalgia rheumatica (HCC) Stable. Continue to follow with rheumatology. Recently changed to tocilizumab injections and DC'd methotrexate. chronic steroids continued.  Chronic low back pain/lumbar radiculitis: -  Acute on chronic low back pain, we'll attempt to refill Lidoderm 5% patches she is getting some benefit from these. Discussed with patient and her daughter today these may not be covered by her insurance. There are over-the-counter preparations that could be used at her 3-4 percent Lidoderm if insurance denies.  Depression with anxiety Doing well on Prozac. She is on high-dose, discussed decreasing dose with her to 60 mg a day and they are agreeable.  - refills for prozac 60 mg, 30d to local pharmacy and 3 months script [facility].  - f/u 6 months as long as doing ok on lower dose.    electronically signed by:  Howard Pouch, DO  [place]

## 2017-01-29 ENCOUNTER — Telehealth: Payer: Self-pay | Admitting: Family Medicine

## 2017-01-29 LAB — IRON,TIBC AND FERRITIN PANEL
%SAT: 31 % (calc) (ref 11–50)
FERRITIN: 399 ng/mL — AB (ref 20–288)
IRON: 92 ug/dL (ref 45–160)
TIBC: 293 mcg/dL (calc) (ref 250–450)

## 2017-01-29 NOTE — Telephone Encounter (Signed)
Please call pt: - her kidney function looks good. Her iron panel looks good.  - her white count is high, but this is stable from prior and likely secondary to chronic prednisone use. However there are additional changes in amount and size of her red blood cells (increased). It is not drastically high, but I would want to see her in 3 months for a provider appt and retest CMP and CBC w diff.  - In the meantime, make certain she is taking baby ASA and maintaining adequate hydration (water). - 30 min appt on follow up

## 2017-01-29 NOTE — Telephone Encounter (Signed)
Spoke with patient daughter reviewed results and instructions scheduled follow up appt.

## 2017-02-03 ENCOUNTER — Ambulatory Visit (INDEPENDENT_AMBULATORY_CARE_PROVIDER_SITE_OTHER): Payer: Medicare Other | Admitting: Specialist

## 2017-02-03 ENCOUNTER — Encounter (INDEPENDENT_AMBULATORY_CARE_PROVIDER_SITE_OTHER): Payer: Self-pay | Admitting: Specialist

## 2017-02-03 VITALS — BP 140/68 | HR 89 | Ht 63.5 in | Wt 198.0 lb

## 2017-02-03 DIAGNOSIS — Z981 Arthrodesis status: Secondary | ICD-10-CM | POA: Diagnosis not present

## 2017-02-03 DIAGNOSIS — M4807 Spinal stenosis, lumbosacral region: Secondary | ICD-10-CM | POA: Diagnosis not present

## 2017-02-03 DIAGNOSIS — M48062 Spinal stenosis, lumbar region with neurogenic claudication: Secondary | ICD-10-CM

## 2017-02-03 DIAGNOSIS — M21371 Foot drop, right foot: Secondary | ICD-10-CM

## 2017-02-03 DIAGNOSIS — M5136 Other intervertebral disc degeneration, lumbar region: Secondary | ICD-10-CM

## 2017-02-03 MED ORDER — TRAMADOL-ACETAMINOPHEN 37.5-325 MG PO TABS: 1.0000 | ORAL_TABLET | Freq: Four times a day (QID) | ORAL | 0 refills | Status: DC | PRN

## 2017-02-03 NOTE — Patient Instructions (Addendum)
Avoid bending, stooping and avoid lifting weights greater than 10 lbs. Avoid prolong standing and walking. Avoid frequent bending and stooping  No lifting greater than 10 lbs. May use ice or moist heat for pain. Weight loss is of benefit. Handicap license is approved. MRI for lumbar spine to assess the area of the lumbar spine adjacent to the  Lumbar fusion.

## 2017-02-03 NOTE — Progress Notes (Signed)
Office Visit Note   Patient: [name]           Date of Birth: 1929-04-07           MRN: [id number] Visit Date: 02/03/2017              Requested by: Ma Hillock, DO [address] [place], [place] [address] PCP: Ma Hillock, DO   Assessment & Plan: Visit Diagnoses:  1. History of lumbar spinal fusion   2. Spinal stenosis of lumbar region with neurogenic claudication   3. Foot drop, right   4. Other intervertebral disc degeneration, lumbar region   5. Spinal stenosis of lumbosacral region     Plan: Avoid bending, stooping and avoid lifting weights greater than 10 lbs. Avoid prolong standing and walking. Avoid frequent bending and stooping  No lifting greater than 10 lbs. May use ice or moist heat for pain. Weight loss is of benefit. Handicap license is approved. MRI for lumbar spine to assess the area of the lumbar spine adjacent to the  Lumbar fusion.  Follow-Up Instructions: Return in about 4 weeks (around 03/03/2017).   Orders:  No orders of the defined types were placed in this encounter.  No orders of the defined types were placed in this encounter.     Procedures: No procedures performed   Clinical Data: No additional findings.   Subjective: Chief Complaint  Patient presents with  . Lower Back - Follow-up    83 year old female with spinal stenosis and last seen 06/26/2016 with sever back pain, CT of the pelvis was negative. Then ESI done then 07/03/2016 relieved her pain. She did well  Until recently when the pain returned and she was seen [facility] ER and they referred her to Dr. Ernestina Patches who saw her and then performed ESI which has helped to decrease her pain. After 3 days the numbness stopped and she has continued to have relief of pain.She is only able to walk in the house, she reportedly has fallen about 6 times this year. Mostly falling backwards. She has had an AFO for the right leg which she received about 2 months, AFO has not helped the  falls.  Bowel and bladder are functionally normal, she takes mutamucil. She is not taking any medication now as she has had substantial relief of the pain since that time.     Review of Systems  Constitutional: Positive for activity change, fatigue and unexpected weight change. Negative for appetite change, chills, diaphoresis and fever.  HENT: Positive for hearing loss. Negative for congestion, mouth sores, postnasal drip, rhinorrhea, sinus pressure, sinus pain, sneezing, sore throat, tinnitus and trouble swallowing.   Eyes: Negative.  Negative for photophobia, pain, discharge, redness, itching and visual disturbance.  Respiratory: Positive for shortness of breath and wheezing. Negative for apnea, cough, choking, chest tightness and stridor.   Cardiovascular: Negative.  Negative for chest pain, palpitations and leg swelling.  Gastrointestinal: Positive for constipation. Negative for abdominal distention, abdominal pain, anal bleeding, diarrhea and nausea.  Endocrine: Negative.  Negative for cold intolerance and heat intolerance.  Genitourinary: Negative.  Negative for difficulty urinating, dyspareunia, dysuria, enuresis and flank pain.  Musculoskeletal: Positive for arthralgias, back pain and gait problem.  Skin: Negative.  Negative for color change, pallor, rash and wound.  Allergic/Immunologic: Negative.   Neurological: Positive for weakness and numbness.  Hematological: Negative.  Negative for adenopathy. Does not bruise/bleed easily.  Psychiatric/Behavioral: Negative.  Negative for agitation, behavioral problems,  confusion, decreased concentration, dysphoric mood, hallucinations, self-injury, sleep disturbance and suicidal ideas. The patient is not nervous/anxious and is not hyperactive.      Objective: Vital Signs: BP 140/68 (BP Location: Left Arm, Patient Position: Sitting)   Pulse 89   Ht 5' 3.5" (1.613 m)   Wt 198 lb (89.8 kg)   BMI 34.52 kg/m   Physical Exam  Constitutional:  She is oriented to person, place, and time. She appears well-developed and well-nourished. No distress.  HENT:  Head: Normocephalic and atraumatic.  Mouth/Throat: No oropharyngeal exudate.  Eyes: EOM are normal. Pupils are equal, round, and reactive to light. Right eye exhibits no discharge. Left eye exhibits no discharge. No scleral icterus.  Neck: Normal range of motion. Neck supple.  Pulmonary/Chest: Effort normal and breath sounds normal. No stridor. No respiratory distress. She has no wheezes. She has no rales. She exhibits no tenderness.  Abdominal: Soft. Bowel sounds are normal. She exhibits no distension. There is no tenderness. There is no guarding.  Neurological: She is alert and oriented to person, place, and time.  Skin: Skin is warm and dry. No rash noted. She is not diaphoretic. No erythema. No pallor.  Psychiatric: She has a normal mood and affect. Her behavior is normal. Judgment and thought content normal.    Back Exam   Tenderness  The patient is experiencing tenderness in the lumbar.  Range of Motion  Extension: abnormal  Flexion: normal  Lateral bend right: normal  Lateral bend left: normal  Rotation right: normal  Rotation left: normal   Muscle Strength  Right Quadriceps:  5/5  Left Quadriceps:  5/5  Right Hamstrings:  5/5  Left Hamstrings:  5/5   Tests  Straight leg raise right: negative Straight leg raise left: negative  Reflexes  Patellar:  Hyporeflexic normal Achilles:  Hyporeflexic normal Babinski's sign: normal   Other  Toe walk: abnormal Gait: drop-foot  Erythema: no back redness Scars: present  Comments:  AFO right leg.      Specialty Comments:  No specialty comments available.  Imaging: No results found.   PMFS History: Patient Active Problem List   Diagnosis Date Noted  . Chronic back pain 01/28/2017  . Iron deficiency 01/28/2017  . Morbid obesity ([place]) 01/28/2017  . Peptic ulcer disease   . Lumbar radiculitis   .  Frequent falls 08/02/2016  . Hypertensive retinopathy of both eyes 09/07/2015  . Medicare annual wellness visit, subsequent 07/11/2015  . Long-term use of high-risk medication 07/11/2015  . Osteopenia 04/11/2015  . Depression with anxiety 04/11/2015  . Giant cell arteritis ([place]) 03/28/2015  . Polymyalgia rheumatica ([place]) 03/16/2015  . Chronic narcotic use 03/16/2015  . Anemia, iron deficiency 07/14/2013  . Asthma, moderate persistent 10/15/2012  . Hyperlipidemia 12/27/2008  . HYPERTENSION, BENIGN 12/27/2008   Past Medical History:  Diagnosis Date  . Abdominal bloating   . Abnormal LFTs (liver function tests)   . Anxiety disorder   . Arthritis   . Asthma   . Depression   . Diverticulosis   . Edema   . Fatty liver   . Fibromyalgia   . GERD (gastroesophageal reflux disease)   . Giant cell arteritis (HCC)    No biopsy secondary to steroid use  . Hx of adenomatous colonic polyps   . Hyperlipidemia   . Hypertension   . IBS (irritable bowel syndrome)   . Lumbar radiculitis   . Peptic ulcer disease   . Skin cancer    shoulder, leg, right eye (?  squamous by resection)  . Thoracic radiculitis   . Thyroid nodule    "removed"  . Urinary incontinence   . Vertigo     Family History  Problem Relation Age of Onset  . Colon polyps Maternal Aunt   . Diabetes Father   . Heart disease Father   . Heart disease Mother   . Lung disease Mother   . COPD Mother   . Lung cancer Brother   . Cancer Brother        lung  . COPD Brother   . Stroke Brother   . Lung cancer Sister   . COPD Sister   . Kidney disease Unknown        niece-mat side  . Arthritis Unknown   . Diabetes Son   . Arthritis Daughter   . Colon cancer Neg Hx     Past Surgical History:  Procedure Laterality Date  . ABDOMINAL HYSTERECTOMY    . BACK SURGERY  2005   lower back  . EYE SURGERY Right 2006  . LIVER BIOPSY     fatty liver  . NASAL SEPTUM SURGERY    . right knee surgery  2006   arthroscopy  . right  leg skin cancer surgery  2014  . right shoulder skin cancer excision  2012  . THYROIDECTOMY     Social History   Occupational History  . Occupation: Retired Water engineer  Tobacco Use  . Smoking status: Never Smoker  . Smokeless tobacco: Never Used  Substance and Sexual Activity  . Alcohol use: No  . Drug use: No  . Sexual activity: No

## 2017-02-05 ENCOUNTER — Telehealth: Payer: Self-pay | Admitting: *Deleted

## 2017-02-05 NOTE — Telephone Encounter (Signed)
Patients daughter [name] called and states patient has a severe headache that woke her up . Daughter states they took patients BP 3 times and reports 155/80 133/82 and 141/71. Patient complains of her teeth even hurt. Patient has taken a tramadol she had and is waiting to see if it works . Advised patient daughter to take patient to UC if symptoms persist or any new symptoms develop. Patient daughter verbalized understanding.

## 2017-02-14 ENCOUNTER — Ambulatory Visit: Payer: Self-pay

## 2017-02-14 NOTE — Telephone Encounter (Signed)
Spoke with patient daughter explained to her we have no providers in our office today and Dr Raoul Pitch does not call in antibiotics without seeing patient to make sure it is appropriate. Advised patient daughter if she feels her mom needs to be seen before Monday she can call and see if [facility] has an opening or go to Urgent Care. [name] verbalized understanding.

## 2017-02-14 NOTE — Telephone Encounter (Signed)
Pt calling to see if an antibiotic could be called in. Advised that the Dr. Usually will not call anything in unless seen and examined to determined that abx is needed.  Appt for made 1 pm with Dr Raoul Pitch 02/17/17. Pt having sinus pain pressure and thick yellow nasal discharge. Pharmacy: [facility] number is [phone].  Reason for Disposition . [1] Sinus congestion (pressure, fullness) AND [2] present > 10 days  Answer Assessment - Initial Assessment Questions 1. LOCATION: "Where does it hurt?"      Cheeks and  "eyeballs"  "When did the sinus pain start?"  (e.g., hours, days)   2. ONSET    Little over 2 weeks ago 3. SEVERITY: "How bad is the pain?"   (Scale 1-10; mild, moderate or severe)   - MILD (1-3): doesn't interfere with normal activities    - MODERATE (4-7): interferes with normal activities (e.g., work or school) or awakens from sleep   - SEVERE (8-10): excruciating pain and patient unable to do any normal activities        5/10 4. RECURRENT SYMPTOM: "Have you ever had sinus problems before?" If so, ask: "When was the last time?" and "What happened that time?"      Yes has h/o bronchitis if she doesn't get it stopped   Had a antibiotic called in last time 5. NASAL CONGESTION: "Is the nose blocked?" If so, ask, "Can you open it or must you breathe through the mouth?"     Breathes through nose using saline nasal spray 6. NASAL DISCHARGE: "Do you have discharge from your nose?" If so ask, "What color?"     Yes its is dk yellow  7. FEVER: "Do you have a fever?" If so, ask: "What is it, how was it measured, and when did it start?"      No  8. OTHER SYMPTOMS: "Do you have any other symptoms?" (e.g., sore throat, cough, earache, difficulty breathing)     Earache when "medicine wears off" 9. PREGNANCY: "Is there any chance you are pregnant?" "When was your last menstrual period?"     n/a  Protocols used: SINUS PAIN OR CONGESTION-A-AH

## 2017-02-17 ENCOUNTER — Encounter: Payer: Self-pay | Admitting: Family Medicine

## 2017-02-17 ENCOUNTER — Ambulatory Visit (INDEPENDENT_AMBULATORY_CARE_PROVIDER_SITE_OTHER): Payer: Medicare Other | Admitting: Family Medicine

## 2017-02-17 VITALS — BP 148/72 | HR 84 | Temp 98.3°F | Resp 20 | Wt 197.0 lb

## 2017-02-17 DIAGNOSIS — J320 Chronic maxillary sinusitis: Secondary | ICD-10-CM | POA: Diagnosis not present

## 2017-02-17 MED ORDER — AMOXICILLIN 500 MG PO CAPS: 500.0000 mg | ORAL_CAPSULE | Freq: Three times a day (TID) | ORAL | 0 refills | Status: DC

## 2017-02-17 NOTE — Progress Notes (Signed)
[name] , 1929/09/22, 83 y.o., female MRN: [id number] Patient Care Team    Relationship Specialty Notifications Start End  Ma Hillock, DO PCP - General Family Medicine  03/16/15   Hermelinda Medicus, MD  Internal Medicine  07/11/15   Fay Records, MD Consulting Physician Cardiology  07/11/15   Lafayette Dragon, MD (Inactive)  Gastroenterology  07/11/15   Tanda Rockers, MD Consulting Physician Pulmonary Disease  07/11/15   Jessy Oto, MD Consulting Physician Orthopedic Surgery  07/12/16   Magnus Sinning, MD Consulting Physician Physical Medicine and Rehabilitation  07/12/16     Chief Complaint  Patient presents with  . URI    facial pain     Subjective: Pt presents for an OV with complaints of facial pain of 3 weeks  duration.  Associated symptoms include facial pressure, headache, thick mucous production and dry cough. She has noticed she is hot, but uncertain if a fever.  Pt has tried mucinex to ease their symptoms.   Depression screen [facility] 2/9 01/28/2017 07/12/2016 07/11/2015 03/16/2015  Decreased Interest 0 0 0 0  Down, Depressed, Hopeless 0 0 0 0  PHQ - 2 Score 0 0 0 0  Altered sleeping 1 - - -  Tired, decreased energy 1 - - -  Change in appetite 0 - - -  Feeling bad or failure about yourself  0 - - -  Trouble concentrating 0 - - -  Suicidal thoughts 0 - - -  PHQ-9 Score 2 - - -    Allergies  Allergen Reactions  . Atorvastatin Other (See Comments)    Severe headache  . Statins   . Advair Diskus [Fluticasone-Salmeterol] Other (See Comments)    Mouth sores  . Sulfonamide Derivatives Rash   Social History   Tobacco Use  . Smoking status: Never Smoker  . Smokeless tobacco: Never Used  Substance Use Topics  . Alcohol use: No   Past Medical History:  Diagnosis Date  . Abdominal bloating   . Abnormal LFTs (liver function tests)   . Anxiety disorder   . Arthritis   . Asthma   . Depression   . Diverticulosis   . Edema   . Fatty liver   .  Fibromyalgia   . GERD (gastroesophageal reflux disease)   . Giant cell arteritis (HCC)    No biopsy secondary to steroid use  . Hx of adenomatous colonic polyps   . Hyperlipidemia   . Hypertension   . IBS (irritable bowel syndrome)   . Lumbar radiculitis   . Peptic ulcer disease   . Skin cancer    shoulder, leg, right eye (? squamous by resection)  . Thoracic radiculitis   . Thyroid nodule    "removed"  . Urinary incontinence   . Vertigo    Past Surgical History:  Procedure Laterality Date  . ABDOMINAL HYSTERECTOMY    . BACK SURGERY  2005   lower back  . EYE SURGERY Right 2006  . LIVER BIOPSY     fatty liver  . NASAL SEPTUM SURGERY    . right knee surgery  2006   arthroscopy  . right leg skin cancer surgery  2014  . right shoulder skin cancer excision  2012  . THYROIDECTOMY     Family History  Problem Relation Age of Onset  . Colon polyps Maternal Aunt   . Diabetes Father   . Heart disease Father   . Heart disease Mother   . Lung  disease Mother   . COPD Mother   . Lung cancer Brother   . Cancer Brother        lung  . COPD Brother   . Stroke Brother   . Lung cancer Sister   . COPD Sister   . Kidney disease Unknown        niece-mat side  . Arthritis Unknown   . Diabetes Son   . Arthritis Daughter   . Colon cancer Neg Hx    Allergies as of 02/17/2017      Reactions   Atorvastatin Other (See Comments)   Severe headache   Statins    Advair Diskus [fluticasone-salmeterol] Other (See Comments)   Mouth sores   Sulfonamide Derivatives Rash      Medication List        Accurate as of 02/17/17  1:11 PM. Always use your most recent med list.          albuterol 108 (90 Base) MCG/ACT inhaler Commonly known as:  PROAIR HFA 2 puffs up to every 4 hours if can't catch your breath   alendronate 70 MG tablet Commonly known as:  FOSAMAX Take 70 mg by mouth once a week. Take with a full glass of water on an empty stomach.   aspirin 81 MG tablet Take 81 mg by  mouth daily.   budesonide-formoterol 80-4.5 MCG/ACT inhaler Commonly known as:  SYMBICORT Inhale 2 puffs into the lungs 2 (two) times daily.   diazepam 5 MG tablet Commonly known as:  VALIUM Take 1/2 tablet by mouth twice daily as needed for back spasms   ferrous sulfate 325 (65 FE) MG tablet Take 1 tablet (325 mg total) by mouth 3 (three) times daily with meals.   FLUoxetine 20 MG capsule Commonly known as:  PROZAC Take 3 capsules (60 mg total) daily by mouth.   lidocaine 5 % Commonly known as:  LIDODERM Place 1 patch daily onto the skin. Remove & Discard patch within 12 hours or as directed by MD   olmesartan 20 MG tablet Commonly known as:  BENICAR Take 1.5 tablets (30 mg total) daily by mouth.   predniSONE 5 MG tablet Commonly known as:  DELTASONE Take 7.5 mg daily by mouth.   tocilizumab 4 mg/kg in sodium chloride 0.9 % Inject 280 mg into the vein.   traMADol-acetaminophen 37.5-325 MG tablet Commonly known as:  ULTRACET Take 1 tablet every 6 (six) hours as needed by mouth.       All past medical history, surgical history, allergies, family history, immunizations andmedications were updated in the EMR today and reviewed under the history and medication portions of their EMR.     ROS: Negative, with the exception of above mentioned in HPI   Objective:  BP (!) 148/72 (BP Location: Right Arm, Patient Position: Sitting, Cuff Size: Large)   Pulse 84   Temp 98.3 F (36.8 C)   Resp 20   Wt 197 lb (89.4 kg)   SpO2 95%   BMI 34.35 kg/m  Body mass index is 34.35 kg/m. Gen: Afebrile. No acute distress. Nontoxic in appearance, well developed, well nourished. Sweating. HENT: AT. [place]. Bilateral TM visualized without erythema or bulging. MMM, no oral lesions. Bilateral nares with erythema and drainage. Throat without erythema or exudates. Postnasal drip present. No cough or hoarseness present. Tender to palpation frontal and maxillary sinus. Eyes:Pupils Equal Round  Reactive to light, Extraocular movements intact,  Conjunctiva without redness, discharge or icterus. Neck/lymp/endocrine: Supple, no lymphadenopathy CV: RRR,  no edema Chest: CTAB, no wheeze or crackles. Good air movement, normal resp effort.   Neuro:  Normal gait. PERLA. EOMi. Alert. Oriented x3   No exam data present No results found. No results found for this or any previous visit (from the past 24 hour(s)).  Assessment/Plan: Taleya Whitcher is a 83 y.o. female present for OV for  1. Chronic maxillary sinusitis Rest, hydrate. Humidifier use. +/- flonase, mucinex (DM if cough), nettie pot or nasal saline.  Amoxicillin 3 times a day prescribed, take until completed.  If cough present it can last up to 6-8 weeks.  F/U 2 weeks of not improved.     Reviewed expectations re: course of current medical issues.  Discussed self-management of symptoms.  Outlined signs and symptoms indicating need for more acute intervention.  Patient verbalized understanding and all questions were answered.  Patient received an After-Visit Summary.    No orders of the defined types were placed in this encounter.    Note is dictated utilizing voice recognition software. Although note has been proof read prior to signing, occasional typographical errors still can be missed. If any questions arise, please do not hesitate to call for verification.   electronically signed by:  Howard Pouch, DO  [place]

## 2017-02-17 NOTE — Patient Instructions (Addendum)
Rest, hydrate. Use a humidifier.  + flonase, mucinex (DM if cough), nettie pot or nasal saline.  amoxacillin every 8 hours.  prescribed, take until completed.  If cough present it can last up to 6-8 weeks.  F/U 2 weeks of not improved.     Sinusitis, Adult Sinusitis is soreness and inflammation of your sinuses. Sinuses are hollow spaces in the bones around your face. They are located:  Around your eyes.  In the middle of your forehead.  Behind your nose.  In your cheekbones.  Your sinuses and nasal passages are lined with a stringy fluid (mucus). Mucus normally drains out of your sinuses. When your nasal tissues get inflamed or swollen, the mucus can get trapped or blocked so air cannot flow through your sinuses. This lets bacteria, viruses, and funguses grow, and that leads to infection. Follow these instructions at home: Medicines  Take, use, or apply over-the-counter and prescription medicines only as told by your doctor. These may include nasal sprays.  If you were prescribed an antibiotic medicine, take it as told by your doctor. Do not stop taking the antibiotic even if you start to feel better. Hydrate and Humidify  Drink enough water to keep your pee (urine) clear or pale yellow.  Use a cool mist humidifier to keep the humidity level in your home above 50%.  Breathe in steam for 10-15 minutes, 3-4 times a day or as told by your doctor. You can do this in the bathroom while a hot shower is running.  Try not to spend time in cool or dry air. Rest  Rest as much as possible.  Sleep with your head raised (elevated).  Make sure to get enough sleep each night. General instructions  Put a warm, moist washcloth on your face 3-4 times a day or as told by your doctor. This will help with discomfort.  Wash your hands often with soap and water. If there is no soap and water, use hand sanitizer.  Do not smoke. Avoid being around people who are smoking (secondhand  smoke).  Keep all follow-up visits as told by your doctor. This is important. Contact a doctor if:  You have a fever.  Your symptoms get worse.  Your symptoms do not get better within 10 days. Get help right away if:  You have a very bad headache.  You cannot stop throwing up (vomiting).  You have pain or swelling around your face or eyes.  You have trouble seeing.  You feel confused.  Your neck is stiff.  You have trouble breathing. This information is not intended to replace advice given to you by your health care provider. Make sure you discuss any questions you have with your health care provider. Document Released: 08/21/2007 Document Revised: 10/29/2015 Document Reviewed: 12/28/2014 Elsevier Interactive Patient Education  [employer].

## 2017-02-25 ENCOUNTER — Other Ambulatory Visit: Payer: Medicare Other

## 2017-02-28 ENCOUNTER — Ambulatory Visit
Admission: RE | Admit: 2017-02-28 | Discharge: 2017-02-28 | Disposition: A | Discharge status: Home / Self Care | Payer: Medicare Other | Source: Ambulatory Visit | Attending: Specialist | Admitting: Specialist

## 2017-02-28 DIAGNOSIS — M48061 Spinal stenosis, lumbar region without neurogenic claudication: Secondary | ICD-10-CM | POA: Diagnosis not present

## 2017-02-28 DIAGNOSIS — M4807 Spinal stenosis, lumbosacral region: Secondary | ICD-10-CM

## 2017-02-28 DIAGNOSIS — M5136 Other intervertebral disc degeneration, lumbar region: Secondary | ICD-10-CM

## 2017-02-28 DIAGNOSIS — M5117 Intervertebral disc disorders with radiculopathy, lumbosacral region: Secondary | ICD-10-CM | POA: Diagnosis not present

## 2017-02-28 MED ORDER — GADOBENATE DIMEGLUMINE 529 MG/ML IV SOLN
18.0000 mL | Freq: Once | INTRAVENOUS | Status: AC | PRN
  Administered 2017-02-28: 18 mL via INTRAVENOUS

## 2017-03-06 DIAGNOSIS — M316 Other giant cell arteritis: Secondary | ICD-10-CM | POA: Diagnosis not present

## 2017-03-06 DIAGNOSIS — Z79899 Other long term (current) drug therapy: Secondary | ICD-10-CM | POA: Diagnosis not present

## 2017-03-12 ENCOUNTER — Ambulatory Visit (INDEPENDENT_AMBULATORY_CARE_PROVIDER_SITE_OTHER): Payer: Medicare Other | Admitting: Specialist

## 2017-03-12 ENCOUNTER — Encounter (INDEPENDENT_AMBULATORY_CARE_PROVIDER_SITE_OTHER): Payer: Self-pay | Admitting: Specialist

## 2017-03-12 VITALS — BP 143/63 | HR 67 | Temp 97.9°F | Ht 63.5 in | Wt 197.0 lb

## 2017-03-12 DIAGNOSIS — M5441 Lumbago with sciatica, right side: Secondary | ICD-10-CM | POA: Diagnosis not present

## 2017-03-12 DIAGNOSIS — M48062 Spinal stenosis, lumbar region with neurogenic claudication: Secondary | ICD-10-CM

## 2017-03-12 MED ORDER — TRAMADOL-ACETAMINOPHEN 37.5-325 MG PO TABS: 1.0000 | ORAL_TABLET | Freq: Four times a day (QID) | ORAL | 0 refills | Status: DC | PRN

## 2017-03-12 NOTE — Progress Notes (Addendum)
Office Visit Note   Patient: [name]           Date of Birth: 01/22/1930           MRN: [id number] Visit Date: 03/12/2017              Requested by: Ma Hillock, DO [address] [place], [place] [address] PCP: Ma Hillock, DO   Assessment & Plan: Visit Diagnoses:  1. Spinal stenosis of lumbar region with neurogenic claudication   2. Low back pain with right-sided sciatica, unspecified back pain laterality, unspecified chronicity    83 year old female with neurogenic claudiction and upper lumbar radiculopathy Pain not responding to most recent ESI, will try TENs, and refer to pain management and to Dr. Ernestina Patches for consideration of a spinal cord stimulator. She does not wish to consider surgical intervention. Ultracet for pain but less due to use of prozac to prevent interaction.   Plan: Avoid bending, stooping and avoid lifting weights greater than 10 lbs. Avoid prolong standing and walking. Avoid frequent bending and stooping  No lifting greater than 10 lbs. May use ice or moist heat for pain. Weight loss is of benefit. Handicap license is approved. Dr. Romona Curls secretary/Assistant will call to arrange for evaluation for a spinal cord stimulator.  Obtain a TENs Unit trial at [facility]  Pain management referra  Follow-Up Instructions: Return in about 4 weeks (around 04/09/2017).   Orders:  Orders Placed This Encounter  Procedures  . Ambulatory referral to Physical Therapy  . Ambulatory referral to Physical Medicine Rehab   No orders of the defined types were placed in this encounter.     Procedures: No procedures performed   Clinical Data: No additional findings.   Subjective: Chief Complaint  Patient presents with  . Lower Back - Follow-up    Follow up MRI Lumbar Spine    83 year old female with chronic back and right greater than left leg and thigh pain. She underwent right L2-3 TF ESI 11/8 without much relief of pain. She is being treated with  IV Antiinflamatory medications by  Her rheumatologist and she does hav polymyalgia rheumatica.    Review of Systems  Constitutional: Negative.   HENT: Negative.   Eyes: Negative.   Respiratory: Negative.   Cardiovascular: Negative.   Gastrointestinal: Negative.   Endocrine: Negative.   Genitourinary: Negative.   Musculoskeletal: Negative.   Skin: Negative.   Allergic/Immunologic: Negative.   Neurological: Negative.   Hematological: Negative.   Psychiatric/Behavioral: Negative.      Objective: Vital Signs: BP (!) 143/63 (BP Location: Left Arm, Patient Position: Sitting, Cuff Size: Normal)   Pulse 67   Temp 97.9 F (36.6 C) (Oral)   Ht 5' 3.5" (1.613 m)   Wt 197 lb (89.4 kg)   BMI 34.35 kg/m   Physical Exam  Constitutional: She is oriented to person, place, and time. She appears well-developed and well-nourished.  HENT:  Head: Normocephalic and atraumatic.  Eyes: EOM are normal. Pupils are equal, round, and reactive to light. Right eye exhibits no discharge. Left eye exhibits no discharge.  Neck: Normal range of motion. Neck supple.  Pulmonary/Chest: Effort normal and breath sounds normal.  Abdominal: Soft. Bowel sounds are normal.  Neurological: She is alert and oriented to person, place, and time.  Skin: Skin is warm and dry.  Psychiatric: She has a normal mood and affect. Her behavior is normal. Judgment and thought content normal.    Back Exam  Tenderness  The patient is experiencing tenderness in the lumbar.  Range of Motion  Extension: abnormal  Flexion: abnormal  Lateral bend right: abnormal  Rotation right: abnormal  Rotation left: abnormal   Muscle Strength  Right Quadriceps:  5/5  Left Quadriceps:  5/5  Right Hamstrings:  5/5   Tests  Straight leg raise right: negative Straight leg raise left: negative  Reflexes  Patellar: normal Achilles: normal Babinski's sign: normal   Other  Toe walk: abnormal Heel walk: abnormal Sensation:  normal Gait: abnormal  Erythema: no back redness Scars: present  Comments:  Using a walker, pain pattern is that of claudication, central canal lumbar is without stenosis, right subarticular L2-3 with small HNP and right lateral recess narrowing affecting the L4 nerve root. There is a left L1-2 lateral recess and foramenal and extraforamenal disc bulge.      Specialty Comments:  No specialty comments available.  Imaging: No results found.   PMFS History: Patient Active Problem List   Diagnosis Date Noted  . Chronic back pain 01/28/2017  . Iron deficiency 01/28/2017  . Morbid obesity ([place]) 01/28/2017  . Peptic ulcer disease   . Lumbar radiculitis   . Frequent falls 08/02/2016  . Hypertensive retinopathy of both eyes 09/07/2015  . Medicare annual wellness visit, subsequent 07/11/2015  . Long-term use of high-risk medication 07/11/2015  . Osteopenia 04/11/2015  . Depression with anxiety 04/11/2015  . Giant cell arteritis ([place]) 03/28/2015  . Polymyalgia rheumatica ([place]) 03/16/2015  . Chronic narcotic use 03/16/2015  . Anemia, iron deficiency 07/14/2013  . Asthma, moderate persistent 10/15/2012  . Hyperlipidemia 12/27/2008  . HYPERTENSION, BENIGN 12/27/2008   Past Medical History:  Diagnosis Date  . Abdominal bloating   . Abnormal LFTs (liver function tests)   . Anxiety disorder   . Arthritis   . Asthma   . Depression   . Diverticulosis   . Edema   . Fatty liver   . Fibromyalgia   . GERD (gastroesophageal reflux disease)   . Giant cell arteritis (HCC)    No biopsy secondary to steroid use  . Hx of adenomatous colonic polyps   . Hyperlipidemia   . Hypertension   . IBS (irritable bowel syndrome)   . Lumbar radiculitis   . Peptic ulcer disease   . Skin cancer    shoulder, leg, right eye (? squamous by resection)  . Thoracic radiculitis   . Thyroid nodule    "removed"  . Urinary incontinence   . Vertigo     Family History  Problem Relation Age of Onset  .  Colon polyps Maternal Aunt   . Diabetes Father   . Heart disease Father   . Heart disease Mother   . Lung disease Mother   . COPD Mother   . Lung cancer Brother   . Cancer Brother        lung  . COPD Brother   . Stroke Brother   . Lung cancer Sister   . COPD Sister   . Kidney disease Unknown        niece-mat side  . Arthritis Unknown   . Diabetes Son   . Arthritis Daughter   . Colon cancer Neg Hx     Past Surgical History:  Procedure Laterality Date  . ABDOMINAL HYSTERECTOMY    . BACK SURGERY  2005   lower back  . EYE SURGERY Right 2006  . LIVER BIOPSY     fatty liver  . NASAL SEPTUM SURGERY    .  right knee surgery  2006   arthroscopy  . right leg skin cancer surgery  2014  . right shoulder skin cancer excision  2012  . THYROIDECTOMY     Social History   Occupational History  . Occupation: Retired Water engineer  Tobacco Use  . Smoking status: Never Smoker  . Smokeless tobacco: Never Used  Substance and Sexual Activity  . Alcohol use: No  . Drug use: No  . Sexual activity: No

## 2017-03-12 NOTE — Patient Instructions (Addendum)
Avoid bending, stooping and avoid lifting weights greater than 10 lbs. Avoid prolong standing and walking. Avoid frequent bending and stooping  No lifting greater than 10 lbs. May use ice or moist heat for pain. Weight loss is of benefit. Handicap license is approved. Dr. Romona Curls secretary/Assistant will call to arrange for evaluation for a spinal cord stimulator.  Obtain a TENs Unit trial at [facility]  Pain management referral.

## 2017-04-01 ENCOUNTER — Encounter: Payer: Self-pay | Admitting: Family Medicine

## 2017-04-01 ENCOUNTER — Ambulatory Visit (INDEPENDENT_AMBULATORY_CARE_PROVIDER_SITE_OTHER): Payer: Medicare Other | Admitting: Family Medicine

## 2017-04-01 VITALS — BP 141/76 | HR 86 | Temp 98.0°F | Resp 20 | Ht 64.0 in | Wt 193.5 lb

## 2017-04-01 DIAGNOSIS — D582 Other hemoglobinopathies: Secondary | ICD-10-CM | POA: Diagnosis not present

## 2017-04-01 DIAGNOSIS — D7589 Other specified diseases of blood and blood-forming organs: Secondary | ICD-10-CM | POA: Diagnosis not present

## 2017-04-01 DIAGNOSIS — I10 Essential (primary) hypertension: Secondary | ICD-10-CM

## 2017-04-01 DIAGNOSIS — M545 Low back pain, unspecified: Secondary | ICD-10-CM

## 2017-04-01 DIAGNOSIS — G8929 Other chronic pain: Secondary | ICD-10-CM

## 2017-04-01 LAB — COMPREHENSIVE METABOLIC PANEL
ALBUMIN: 4.2 g/dL (ref 3.5–5.2)
ALK PHOS: 53 U/L (ref 39–117)
ALT: 41 U/L — ABNORMAL HIGH (ref 0–35)
AST: 43 U/L — AB (ref 0–37)
BUN: 19 mg/dL (ref 6–23)
CALCIUM: 10.1 mg/dL (ref 8.4–10.5)
CHLORIDE: 101 meq/L (ref 96–112)
CO2: 31 mEq/L (ref 19–32)
CREATININE: 0.94 mg/dL (ref 0.40–1.20)
GFR: 60.04 mL/min (ref >60.00)
Glucose, Bld: 116 mg/dL — ABNORMAL HIGH (ref 70–99)
Potassium: 4.4 mEq/L (ref 3.5–5.1)
SODIUM: 141 meq/L (ref 135–145)
TOTAL PROTEIN: 6.4 g/dL (ref 6.0–8.3)
Total Bilirubin: 0.5 mg/dL (ref 0.2–1.2)

## 2017-04-01 LAB — CBC WITH DIFFERENTIAL/PLATELET
BASOS PCT: 0.6 % (ref 0.0–3.0)
Basophils Absolute: 0.1 10*3/uL (ref 0.0–0.1)
EOS ABS: 0.1 10*3/uL (ref 0.0–0.7)
EOS PCT: 1.4 % (ref 0.0–5.0)
HEMATOCRIT: 48.4 % — AB (ref 36.0–46.0)
HEMOGLOBIN: 16.3 g/dL — AB (ref 12.0–15.0)
LYMPHS PCT: 16 % (ref 12.0–46.0)
Lymphs Abs: 1.6 10*3/uL (ref 0.7–4.0)
MCHC: 33.7 g/dL (ref 30.0–36.0)
MCV: 99.1 fl (ref 78.0–100.0)
MONO ABS: 0.6 10*3/uL (ref 0.1–1.0)
Monocytes Relative: 5.6 % (ref 3.0–12.0)
Neutro Abs: 7.7 10*3/uL (ref 1.4–7.7)
Neutrophils Relative %: 76.4 % (ref 43.0–77.0)
Platelets: 221 10*3/uL (ref 150.0–400.0)
RBC: 4.89 Mil/uL (ref 3.87–5.11)
RDW: 13.2 % (ref 11.5–15.5)
WBC: 10 10*3/uL (ref 4.0–10.5)

## 2017-04-01 MED ORDER — DIAZEPAM 5 MG PO TABS: 2.5000 mg | ORAL_TABLET | Freq: Two times a day (BID) | ORAL | 1 refills | Status: DC | PRN

## 2017-04-01 NOTE — Patient Instructions (Signed)
We will collect your blood work today and recheck you levels.  I will call you with results and plan.   I have refilled your valium for you today.   Continue routine follow up for Hypertension.

## 2017-04-01 NOTE — Progress Notes (Signed)
[name] , 10/16/1929, 84 y.o., female MRN: [id number] Patient Care Team    Relationship Specialty Notifications Start End  Ma Hillock, DO PCP - General Family Medicine  03/16/15   Hermelinda Medicus, MD  Internal Medicine  07/11/15   Fay Records, MD Consulting Physician Cardiology  07/11/15   Lafayette Dragon, MD (Inactive)  Gastroenterology  07/11/15   Tanda Rockers, MD Consulting Physician Pulmonary Disease  07/11/15   Jessy Oto, MD Consulting Physician Orthopedic Surgery  07/12/16   Magnus Sinning, MD Consulting Physician Physical Medicine and Rehabilitation  07/12/16     Chief Complaint  Patient presents with  . Follow-up    previous lab abnormalities     Subjective: Pt presents for an OV for follow-up on abnormal laboratory evaluation approximately 3 months ago. Patient was found to have a mildly elevated hemoglobin, hematocrit and MCV. These changes were all new for her. She had been on methotrexate in the past, and had been discontinued along with her folic acid. TSH 1.68, hemoglobin 15.5, hematocrit 47.7, MCV 100.1.   Depression screen [facility] 2/9 04/01/2017 01/28/2017 07/12/2016 07/11/2015 03/16/2015  Decreased Interest 0 0 0 0 0  Down, Depressed, Hopeless 0 0 0 0 0  PHQ - 2 Score 0 0 0 0 0  Altered sleeping 0 1 - - -  Tired, decreased energy 0 1 - - -  Change in appetite 0 0 - - -  Feeling bad or failure about yourself  0 0 - - -  Trouble concentrating 0 0 - - -  Moving slowly or fidgety/restless 0 - - - -  Suicidal thoughts 0 0 - - -  PHQ-9 Score 0 2 - - -    Allergies  Allergen Reactions  . Atorvastatin Other (See Comments)    Severe headache  . Statins   . Advair Diskus [Fluticasone-Salmeterol] Other (See Comments)    Mouth sores  . Sulfonamide Derivatives Rash   Social History   Tobacco Use  . Smoking status: Never Smoker  . Smokeless tobacco: Never Used  Substance Use Topics  . Alcohol use: No   Past Medical History:  Diagnosis Date  .  Abdominal bloating   . Abnormal LFTs (liver function tests)   . Anxiety disorder   . Arthritis   . Asthma   . Depression   . Diverticulosis   . Edema   . Fatty liver   . Fibromyalgia   . GERD (gastroesophageal reflux disease)   . Giant cell arteritis (HCC)    No biopsy secondary to steroid use  . Hx of adenomatous colonic polyps   . Hyperlipidemia   . Hypertension   . IBS (irritable bowel syndrome)   . Lumbar radiculitis   . Peptic ulcer disease   . Skin cancer    shoulder, leg, right eye (? squamous by resection)  . Thoracic radiculitis   . Thyroid nodule    "removed"  . Urinary incontinence   . Vertigo    Past Surgical History:  Procedure Laterality Date  . ABDOMINAL HYSTERECTOMY    . BACK SURGERY  2005   lower back  . EYE SURGERY Right 2006  . LIVER BIOPSY     fatty liver  . NASAL SEPTUM SURGERY    . right knee surgery  2006   arthroscopy  . right leg skin cancer surgery  2014  . right shoulder skin cancer excision  2012  . THYROIDECTOMY     Family History  Problem Relation Age of Onset  . Colon polyps Maternal Aunt   . Diabetes Father   . Heart disease Father   . Heart disease Mother   . Lung disease Mother   . COPD Mother   . Lung cancer Brother   . Cancer Brother        lung  . COPD Brother   . Stroke Brother   . Lung cancer Sister   . COPD Sister   . Kidney disease Unknown        niece-mat side  . Arthritis Unknown   . Diabetes Son   . Arthritis Daughter   . Colon cancer Neg Hx    Allergies as of 04/01/2017      Reactions   Atorvastatin Other (See Comments)   Severe headache   Statins    Advair Diskus [fluticasone-salmeterol] Other (See Comments)   Mouth sores   Sulfonamide Derivatives Rash      Medication List        Accurate as of 04/01/17 11:17 AM. Always use your most recent med list.          albuterol 108 (90 Base) MCG/ACT inhaler Commonly known as:  PROAIR HFA 2 puffs up to every 4 hours if can't catch your breath     alendronate 70 MG tablet Commonly known as:  FOSAMAX Take 70 mg by mouth once a week. Take with a full glass of water on an empty stomach.   aspirin 81 MG tablet Take 81 mg by mouth daily.   budesonide-formoterol 80-4.5 MCG/ACT inhaler Commonly known as:  SYMBICORT Inhale 2 puffs into the lungs 2 (two) times daily.   diazepam 5 MG tablet Commonly known as:  VALIUM Take 1/2 tablet by mouth twice daily as needed for back spasms   ferrous sulfate 325 (65 FE) MG tablet Take 1 tablet (325 mg total) by mouth 3 (three) times daily with meals.   FLUoxetine 20 MG capsule Commonly known as:  PROZAC Take 3 capsules (60 mg total) daily by mouth.   lidocaine 5 % Commonly known as:  LIDODERM Place 1 patch daily onto the skin. Remove & Discard patch within 12 hours or as directed by MD   olmesartan 20 MG tablet Commonly known as:  BENICAR Take 1.5 tablets (30 mg total) daily by mouth.   predniSONE 5 MG tablet Commonly known as:  DELTASONE Take 7.5 mg daily by mouth.   tocilizumab 4 mg/kg in sodium chloride 0.9 % Inject 280 mg into the vein.   traMADol-acetaminophen 37.5-325 MG tablet Commonly known as:  ULTRACET Take 1-2 tablets by mouth every 6 (six) hours as needed.       All past medical history, surgical history, allergies, family history, immunizations andmedications were updated in the EMR today and reviewed under the history and medication portions of their EMR.     ROS: Negative, with the exception of above mentioned in HPI   Objective:  BP (!) 141/76 (BP Location: Right Arm, Patient Position: Sitting, Cuff Size: Large)   Pulse 86   Temp 98 F (36.7 C)   Resp 20   Ht _0  (1.626 m)   Wt 193 lb 8 oz (87.8 kg)   SpO2 94%   BMI 33.21 kg/m  Body mass index is 33.21 kg/m. Gen: Afebrile. No acute distress. Nontoxic in appearance, well developed, well nourished.  HENT: AT. .  MMM Eyes:Pupils Equal Round Reactive to light, Extraocular movements intact,   Conjunctiva without redness, discharge or icterus.  Neck/lymp/endocrine: Supple,no lymphadenopathy CV: RRR Chest: CTAB, no wheeze or crackles.   Neuro:  Normal gait. PERLA. EOMi. Alert. Oriented x3  No exam data present No results found. No results found for this or any previous visit (from the past 24 hour(s)).  Assessment/Plan: Indy Prestwood is a 84 y.o. female present for OV for  Elevated hemoglobin (HCC) Macrocytosis without anemia - rpt labs today. Hopefully trending down now that MTX is finished.  - CBC w/Diff - Comp Met (CMET)  Back pain: - Continue follow-up with neurology. - Valium refilled for pt. [place] reviewed.   HYPERTENSION, BENIGN/Morbid obesity - Mildly above goal today. Discussed increasing Benicar to 40 mg daily. Will await laboratory results and patient will be guided on changes after results received. - Comp Met (CMET) - Maintain routine follow-up already has appointment scheduled.   Reviewed expectations re: course of current medical issues.  Discussed self-management of symptoms.  Outlined signs and symptoms indicating need for more acute intervention.  Patient verbalized understanding and all questions were answered.  Patient received an After-Visit Summary.    No orders of the defined types were placed in this encounter.    Note is dictated utilizing voice recognition software. Although note has been proof read prior to signing, occasional typographical errors still can be missed. If any questions arise, please do not hesitate to call for verification.   electronically signed by:  Howard Pouch, DO  [place]

## 2017-04-02 ENCOUNTER — Ambulatory Visit (INDEPENDENT_AMBULATORY_CARE_PROVIDER_SITE_OTHER): Payer: Medicare Other | Admitting: Physical Medicine and Rehabilitation

## 2017-04-03 DIAGNOSIS — M316 Other giant cell arteritis: Secondary | ICD-10-CM | POA: Diagnosis not present

## 2017-04-03 DIAGNOSIS — Z79899 Other long term (current) drug therapy: Secondary | ICD-10-CM | POA: Diagnosis not present

## 2017-04-04 ENCOUNTER — Telehealth: Payer: Self-pay | Admitting: Family Medicine

## 2017-04-04 DIAGNOSIS — R748 Abnormal levels of other serum enzymes: Secondary | ICD-10-CM | POA: Insufficient documentation

## 2017-04-04 DIAGNOSIS — D582 Other hemoglobinopathies: Secondary | ICD-10-CM

## 2017-04-04 DIAGNOSIS — D899 Disorder involving the immune mechanism, unspecified: Principal | ICD-10-CM

## 2017-04-04 DIAGNOSIS — D849 Immunodeficiency, unspecified: Secondary | ICD-10-CM

## 2017-04-04 NOTE — Telephone Encounter (Signed)
Hematology referral placed

## 2017-04-11 ENCOUNTER — Telehealth (INDEPENDENT_AMBULATORY_CARE_PROVIDER_SITE_OTHER): Payer: Self-pay

## 2017-04-11 ENCOUNTER — Ambulatory Visit (INDEPENDENT_AMBULATORY_CARE_PROVIDER_SITE_OTHER): Payer: Medicare Other | Admitting: Specialist

## 2017-04-11 NOTE — Telephone Encounter (Signed)
Patient's daughter [name] would like a call back from you.  Stated that she needed to discuss some things concerning her mother.  Cb# is [phone].  Please advise.  Thank you.

## 2017-04-16 ENCOUNTER — Encounter: Payer: Self-pay | Admitting: Family Medicine

## 2017-04-16 ENCOUNTER — Ambulatory Visit (INDEPENDENT_AMBULATORY_CARE_PROVIDER_SITE_OTHER): Payer: Medicare Other | Admitting: Family Medicine

## 2017-04-16 VITALS — BP 145/76 | HR 84 | Temp 97.7°F | Resp 20 | Wt 195.0 lb

## 2017-04-16 DIAGNOSIS — R350 Frequency of micturition: Secondary | ICD-10-CM | POA: Diagnosis not present

## 2017-04-16 DIAGNOSIS — R8290 Unspecified abnormal findings in urine: Secondary | ICD-10-CM | POA: Diagnosis not present

## 2017-04-16 LAB — POC URINALSYSI DIPSTICK (AUTOMATED)
BILIRUBIN UA: NEGATIVE
Blood, UA: NEGATIVE
GLUCOSE UA: NEGATIVE
KETONES UA: NEGATIVE
Nitrite, UA: NEGATIVE
PH UA: 6 (ref 5.0–8.0)
Spec Grav, UA: 1.025 (ref 1.010–1.025)
Urobilinogen, UA: NEGATIVE E.U./dL — AB

## 2017-04-16 MED ORDER — CEPHALEXIN 500 MG PO CAPS: 500.0000 mg | ORAL_CAPSULE | Freq: Four times a day (QID) | ORAL | 0 refills | Status: DC

## 2017-04-16 NOTE — Progress Notes (Signed)
[name] , Aug 20, 1929, 84 y.o., female MRN: [id number] Patient Care Team    Relationship Specialty Notifications Start End  Ma Hillock, DO PCP - General Family Medicine  03/16/15   Hermelinda Medicus, MD  Internal Medicine  07/11/15   Fay Records, MD Consulting Physician Cardiology  07/11/15   Lafayette Dragon, MD (Inactive)  Gastroenterology  07/11/15   Tanda Rockers, MD Consulting Physician Pulmonary Disease  07/11/15   Jessy Oto, MD Consulting Physician Orthopedic Surgery  07/12/16   Magnus Sinning, MD Consulting Physician Physical Medicine and Rehabilitation  07/12/16     Chief Complaint  Patient presents with  . Urinary Frequency    burning with urination     Subjective: Pt presents for an OV with complaints of urinary frequency and dysuira of  2 weeks duration. She denies fever, chills, nausea or vomit.  Pt has tried azo to ease their symptoms.   Depression screen [facility] 2/9 04/01/2017 01/28/2017 07/12/2016 07/11/2015 03/16/2015  Decreased Interest 0 0 0 0 0  Down, Depressed, Hopeless 0 0 0 0 0  PHQ - 2 Score 0 0 0 0 0  Altered sleeping 0 1 - - -  Tired, decreased energy 0 1 - - -  Change in appetite 0 0 - - -  Feeling bad or failure about yourself  0 0 - - -  Trouble concentrating 0 0 - - -  Moving slowly or fidgety/restless 0 - - - -  Suicidal thoughts 0 0 - - -  PHQ-9 Score 0 2 - - -    Allergies  Allergen Reactions  . Atorvastatin Other (See Comments)    Severe headache  . Statins   . Advair Diskus [Fluticasone-Salmeterol] Other (See Comments)    Mouth sores  . Sulfonamide Derivatives Rash   Social History   Tobacco Use  . Smoking status: Never Smoker  . Smokeless tobacco: Never Used  Substance Use Topics  . Alcohol use: No   Past Medical History:  Diagnosis Date  . Abdominal bloating   . Abnormal LFTs (liver function tests)   . Anxiety disorder   . Arthritis   . Asthma   . Depression   . Diverticulosis   . Edema   . Fatty liver   .  Fibromyalgia   . GERD (gastroesophageal reflux disease)   . Giant cell arteritis (HCC)    No biopsy secondary to steroid use  . Hx of adenomatous colonic polyps   . Hyperlipidemia   . Hypertension   . IBS (irritable bowel syndrome)   . Lumbar radiculitis   . Peptic ulcer disease   . Skin cancer    shoulder, leg, right eye (? squamous by resection)  . Thoracic radiculitis   . Thyroid nodule    "removed"  . Urinary incontinence   . Vertigo    Past Surgical History:  Procedure Laterality Date  . ABDOMINAL HYSTERECTOMY    . BACK SURGERY  2005   lower back  . EYE SURGERY Right 2006  . LIVER BIOPSY     fatty liver  . NASAL SEPTUM SURGERY    . right knee surgery  2006   arthroscopy  . right leg skin cancer surgery  2014  . right shoulder skin cancer excision  2012  . THYROIDECTOMY     Family History  Problem Relation Age of Onset  . Colon polyps Maternal Aunt   . Diabetes Father   . Heart disease Father   .  Heart disease Mother   . Lung disease Mother   . COPD Mother   . Lung cancer Brother   . Cancer Brother        lung  . COPD Brother   . Stroke Brother   . Lung cancer Sister   . COPD Sister   . Kidney disease Unknown        niece-mat side  . Arthritis Unknown   . Diabetes Son   . Arthritis Daughter   . Colon cancer Neg Hx    Allergies as of 04/16/2017      Reactions   Atorvastatin Other (See Comments)   Severe headache   Statins    Advair Diskus [fluticasone-salmeterol] Other (See Comments)   Mouth sores   Sulfonamide Derivatives Rash      Medication List        Accurate as of 04/16/17  2:42 PM. Always use your most recent med list.          albuterol 108 (90 Base) MCG/ACT inhaler Commonly known as:  PROAIR HFA 2 puffs up to every 4 hours if can't catch your breath   alendronate 70 MG tablet Commonly known as:  FOSAMAX Take 70 mg by mouth once a week. Take with a full glass of water on an empty stomach.   aspirin 81 MG tablet Take 81 mg by  mouth daily.   budesonide-formoterol 80-4.5 MCG/ACT inhaler Commonly known as:  SYMBICORT Inhale 2 puffs into the lungs 2 (two) times daily.   diazepam 5 MG tablet Commonly known as:  VALIUM Take 0.5 tablets (2.5 mg total) by mouth every 12 (twelve) hours as needed for anxiety. Take 1/2 tablet by mouth twice daily as needed for back spasms   ferrous sulfate 325 (65 FE) MG tablet Take 1 tablet (325 mg total) by mouth 3 (three) times daily with meals.   FLUoxetine 20 MG capsule Commonly known as:  PROZAC Take 3 capsules (60 mg total) daily by mouth.   lidocaine 5 % Commonly known as:  LIDODERM Place 1 patch daily onto the skin. Remove & Discard patch within 12 hours or as directed by MD   olmesartan 20 MG tablet Commonly known as:  BENICAR Take 1.5 tablets (30 mg total) daily by mouth.   predniSONE 5 MG tablet Commonly known as:  DELTASONE Take 7.5 mg daily by mouth.   tocilizumab 4 mg/kg in sodium chloride 0.9 % Inject 280 mg into the vein.   traMADol-acetaminophen 37.5-325 MG tablet Commonly known as:  ULTRACET Take 1-2 tablets by mouth every 6 (six) hours as needed.       All past medical history, surgical history, allergies, family history, immunizations andmedications were updated in the EMR today and reviewed under the history and medication portions of their EMR.     ROS: Negative, with the exception of above mentioned in HPI   Objective:  BP (!) 145/76 (BP Location: Left Arm, Patient Position: Sitting, Cuff Size: Normal)   Pulse 84   Temp 97.7 F (36.5 C)   Resp 20   Wt 195 lb (88.5 kg)   SpO2 96%   BMI 33.47 kg/m  Body mass index is 33.47 kg/m. Gen: Afebrile. No acute distress. Nontoxic in appearance, well developed, well nourished.  HENT: AT. [place]. [place] MMM Eyes:Pupils Equal Round Reactive to light, Extraocular movements intact,  Conjunctiva without redness, discharge or icterus. CV: RRR Chest: CTAB, no wheeze or crackles.   Abd: Soft. NTND. BS present.   MSK: no  CVA tenderness.  Neuro: Normal gait. PERLA. EOMi. Alert.  No exam data present No results found. Results for orders placed or performed in visit on 04/16/17 (from the past 24 hour(s))  POCT Urinalysis Dipstick (Automated)     Status: Abnormal   Collection Time: 04/16/17  2:38 PM  Result Value Ref Range   Color, UA dark yellow    Clarity, UA cloudy    Glucose, UA Negative    Bilirubin, UA Negative    Ketones, UA Negative    Spec Grav, UA 1.025 1.010 - 1.025   Blood, UA Negative    pH, UA 6.0 5.0 - 8.0   Protein, UA 1+    Urobilinogen, UA negative (A) 0.2 or 1.0 E.U./dL   Nitrite, UA Negative    Leukocytes, UA Large (3+) (A) Negative    Assessment/Plan: Jorge Amparo is a 84 y.o. female present for OV for  Urinary frequency/abnormal urine - urine consistent with UTI, will treat prophylactically and send for culture.  - POCT Urinalysis Dipstick (Automated): +3 leuks. - Urine Culture - keflex QID for 7 days. - f/u PRN     Reviewed expectations re: course of current medical issues.  Discussed self-management of symptoms.  Outlined signs and symptoms indicating need for more acute intervention.  Patient verbalized understanding and all questions were answered.  Patient received an After-Visit Summary.    Orders Placed This Encounter  Procedures  . POCT Urinalysis Dipstick (Automated)     Note is dictated utilizing voice recognition software. Although note has been proof read prior to signing, occasional typographical errors still can be missed. If any questions arise, please do not hesitate to call for verification.   electronically signed by:  Howard Pouch, DO  [place]

## 2017-04-16 NOTE — Patient Instructions (Addendum)
Start keflex every 6 ish hours (4x a day) for 7 days.  We will call you (hopefully Friday) with the culture results.    Plenty of water... HYDRATE.

## 2017-04-16 NOTE — Telephone Encounter (Signed)
lmom for [name] to call me back

## 2017-04-18 ENCOUNTER — Telehealth (INDEPENDENT_AMBULATORY_CARE_PROVIDER_SITE_OTHER): Payer: Self-pay | Admitting: Specialist

## 2017-04-18 NOTE — Telephone Encounter (Signed)
FYI-----I called and spoke with [name], she states that [name] has not rec'd TENS Unit yet, [name] said she has rescheduled appt. Till they can get the unit. Patient has been paying attn to what she has been doing and her pain has decreased. She is using ice as needed and if that doesn't help then she is taking the tramadol.  [name] also states that pt ws dx with UTI, and states that since she has had meds for it her pain has gotten better--

## 2017-04-18 NOTE — Telephone Encounter (Signed)
See other message

## 2017-04-18 NOTE — Telephone Encounter (Signed)
Patient daughter called her back.(anita). Needs to know about appointments and what's going on with patient right.  Please call daughter as soon as you can.

## 2017-04-19 LAB — URINE CULTURE
MICRO NUMBER: 90130480
SPECIMEN QUALITY:: ADEQUATE

## 2017-04-30 ENCOUNTER — Ambulatory Visit (INDEPENDENT_AMBULATORY_CARE_PROVIDER_SITE_OTHER): Payer: Medicare Other | Admitting: Family Medicine

## 2017-04-30 ENCOUNTER — Encounter: Payer: Self-pay | Admitting: Family Medicine

## 2017-04-30 VITALS — BP 156/70 | HR 70 | Temp 98.0°F | Ht 64.0 in | Wt 196.0 lb

## 2017-04-30 DIAGNOSIS — N3000 Acute cystitis without hematuria: Secondary | ICD-10-CM

## 2017-04-30 LAB — POC URINALSYSI DIPSTICK (AUTOMATED)
BILIRUBIN UA: NEGATIVE
Blood, UA: NEGATIVE
GLUCOSE UA: NEGATIVE
KETONES UA: NEGATIVE
Nitrite, UA: NEGATIVE
PH UA: 5.5 (ref 5.0–8.0)
Protein, UA: 30
Spec Grav, UA: 1.025 (ref 1.010–1.025)
Urobilinogen, UA: 0.2 E.U./dL

## 2017-04-30 MED ORDER — FLUOXETINE HCL 20 MG PO CAPS: 60.0000 mg | ORAL_CAPSULE | Freq: Every day | ORAL | 1 refills | Status: DC

## 2017-04-30 MED ORDER — CIPROFLOXACIN HCL 500 MG PO TABS: 500.0000 mg | ORAL_TABLET | Freq: Two times a day (BID) | ORAL | 0 refills | Status: AC

## 2017-04-30 NOTE — Progress Notes (Signed)
[name] , 03/25/1929, 84 y.o., female MRN: [id number] Patient Care Team    Relationship Specialty Notifications Start End  Ma Hillock, DO PCP - General Family Medicine  03/16/15   Hermelinda Medicus, MD  Internal Medicine  07/11/15   Fay Records, MD Consulting Physician Cardiology  07/11/15   Lafayette Dragon, MD (Inactive)  Gastroenterology  07/11/15   Tanda Rockers, MD Consulting Physician Pulmonary Disease  07/11/15   Jessy Oto, MD Consulting Physician Orthopedic Surgery  07/12/16   Magnus Sinning, MD Consulting Physician Physical Medicine and Rehabilitation  07/12/16     Chief Complaint  Patient presents with  . Follow-up    UTI     Subjective: Pt presents for an OV after treatment for P. Mirabilis UTI with 7 day course of keflex. Pt reports she is feeling better, but small amount of dysuria remains. She denies fever, chills, nausea, vomit. She states she had felt better during the abx, but towards the end of pills and over the last week without abx she has noticed her dysuria reoccurring.    Depression screen [facility] 2/9 04/01/2017 01/28/2017 07/12/2016 07/11/2015 03/16/2015  Decreased Interest 0 0 0 0 0  Down, Depressed, Hopeless 0 0 0 0 0  PHQ - 2 Score 0 0 0 0 0  Altered sleeping 0 1 - - -  Tired, decreased energy 0 1 - - -  Change in appetite 0 0 - - -  Feeling bad or failure about yourself  0 0 - - -  Trouble concentrating 0 0 - - -  Moving slowly or fidgety/restless 0 - - - -  Suicidal thoughts 0 0 - - -  PHQ-9 Score 0 2 - - -    Allergies  Allergen Reactions  . Atorvastatin Other (See Comments)    Severe headache  . Statins   . Advair Diskus [Fluticasone-Salmeterol] Other (See Comments)    Mouth sores  . Sulfonamide Derivatives Rash   Social History   Tobacco Use  . Smoking status: Never Smoker  . Smokeless tobacco: Never Used  Substance Use Topics  . Alcohol use: No   Past Medical History:  Diagnosis Date  . Abdominal bloating   .  Abnormal LFTs (liver function tests)   . Anxiety disorder   . Arthritis   . Asthma   . Depression   . Diverticulosis   . Edema   . Fatty liver   . Fibromyalgia   . GERD (gastroesophageal reflux disease)   . Giant cell arteritis (HCC)    No biopsy secondary to steroid use  . Hx of adenomatous colonic polyps   . Hyperlipidemia   . Hypertension   . IBS (irritable bowel syndrome)   . Lumbar radiculitis   . Peptic ulcer disease   . Skin cancer    shoulder, leg, right eye (? squamous by resection)  . Thoracic radiculitis   . Thyroid nodule    "removed"  . Urinary incontinence   . Vertigo    Past Surgical History:  Procedure Laterality Date  . ABDOMINAL HYSTERECTOMY    . BACK SURGERY  2005   lower back  . EYE SURGERY Right 2006  . LIVER BIOPSY     fatty liver  . NASAL SEPTUM SURGERY    . right knee surgery  2006   arthroscopy  . right leg skin cancer surgery  2014  . right shoulder skin cancer excision  2012  . THYROIDECTOMY  Family History  Problem Relation Age of Onset  . Colon polyps Maternal Aunt   . Diabetes Father   . Heart disease Father   . Heart disease Mother   . Lung disease Mother   . COPD Mother   . Lung cancer Brother   . Cancer Brother        lung  . COPD Brother   . Stroke Brother   . Lung cancer Sister   . COPD Sister   . Kidney disease Unknown        niece-mat side  . Arthritis Unknown   . Diabetes Son   . Arthritis Daughter   . Colon cancer Neg Hx    Allergies as of 04/30/2017      Reactions   Atorvastatin Other (See Comments)   Severe headache   Statins    Advair Diskus [fluticasone-salmeterol] Other (See Comments)   Mouth sores   Sulfonamide Derivatives Rash      Medication List        Accurate as of 04/30/17  1:16 PM. Always use your most recent med list.          albuterol 108 (90 Base) MCG/ACT inhaler Commonly known as:  PROAIR HFA 2 puffs up to every 4 hours if can't catch your breath   alendronate 70 MG  tablet Commonly known as:  FOSAMAX Take 70 mg by mouth once a week. Take with a full glass of water on an empty stomach.   aspirin 81 MG tablet Take 81 mg by mouth daily.   budesonide-formoterol 80-4.5 MCG/ACT inhaler Commonly known as:  SYMBICORT Inhale 2 puffs into the lungs 2 (two) times daily.   diazepam 5 MG tablet Commonly known as:  VALIUM Take 0.5 tablets (2.5 mg total) by mouth every 12 (twelve) hours as needed for anxiety. Take 1/2 tablet by mouth twice daily as needed for back spasms   ferrous sulfate 325 (65 FE) MG tablet Take 1 tablet (325 mg total) by mouth 3 (three) times daily with meals.   FLUoxetine 20 MG capsule Commonly known as:  PROZAC Take 3 capsules (60 mg total) daily by mouth.   lidocaine 5 % Commonly known as:  LIDODERM Place 1 patch daily onto the skin. Remove & Discard patch within 12 hours or as directed by MD   olmesartan 20 MG tablet Commonly known as:  BENICAR Take 1.5 tablets (30 mg total) daily by mouth.   predniSONE 5 MG tablet Commonly known as:  DELTASONE Take 7.5 mg daily by mouth.   tocilizumab 4 mg/kg in sodium chloride 0.9 % Inject 280 mg into the vein.   traMADol-acetaminophen 37.5-325 MG tablet Commonly known as:  ULTRACET Take 1-2 tablets by mouth every 6 (six) hours as needed.       All past medical history, surgical history, allergies, family history, immunizations andmedications were updated in the EMR today and reviewed under the history and medication portions of their EMR.     ROS: Negative, with the exception of above mentioned in HPI   Objective:  BP (!) 156/70 (BP Location: Right Arm, Patient Position: Sitting, Cuff Size: Large)   Pulse 70   Temp 98 F (36.7 C) (Oral)   Ht 5\' 4"  (1.626 m)   Wt 196 lb (88.9 kg)   SpO2 94%   BMI 33.64 kg/m  Body mass index is 33.64 kg/m. Gen: Afebrile. No acute distress.  HENT: AT. [place]. MMM.  Eyes:Pupils Equal Round Reactive to light, Extraocular movements intact,  Conjunctiva without redness, discharge or icterus. ABD: no CVA tenderness or suprapubic pressure.  Neuro: Normal gait. PERLA. EOMi. Alert. Oriented x3  No exam data present No results found. Results for orders placed or performed in visit on 04/30/17 (from the past 24 hour(s))  POCT Urinalysis Dipstick (Automated)     Status: Abnormal   Collection Time: 04/30/17  1:14 PM  Result Value Ref Range   Color, UA yellow    Clarity, UA cloudy    Glucose, UA negative    Bilirubin, UA negative    Ketones, UA negative    Spec Grav, UA 1.025 1.010 - 1.025   Blood, UA negative    pH, UA 5.5 5.0 - 8.0   Protein, UA 30    Urobilinogen, UA 0.2 0.2 or 1.0 E.U./dL   Nitrite, UA negaitve    Leukocytes, UA Small (1+) (A) Negative    Assessment/Plan: Shalinda Burkholder is a 84 y.o. female present for OV for  Urinary frequency/abnormal urine - P. Mirabilis UTI treated w/ keflex. Mild dysuria reoccurring. Urine looks better, still mild leuks I sent the Rx to the pharmacy. For culture.  - cipro prescribed.  - f/u PRN     Reviewed expectations re: course of current medical issues.  Discussed self-management of symptoms.  Outlined signs and symptoms indicating need for more acute intervention.  Patient verbalized understanding and all questions were answered.  Patient received an After-Visit Summary.    Orders Placed This Encounter  Procedures  . POCT Urinalysis Dipstick (Automated)     Note is dictated utilizing voice recognition software. Although note has been proof read prior to signing, occasional typographical errors still can be missed. If any questions arise, please do not hesitate to call for verification.   electronically signed by:  Howard Pouch, DO  [place]

## 2017-04-30 NOTE — Patient Instructions (Signed)
Cipro prescribed take very 12 hours for 5 days.  If no signs of recurrent UTI and/or burning does not completely resolved will need to consider a pelvic exam.

## 2017-05-02 LAB — URINE CULTURE
MICRO NUMBER:: 90196733
SPECIMEN QUALITY: ADEQUATE

## 2017-05-09 DIAGNOSIS — M316 Other giant cell arteritis: Secondary | ICD-10-CM | POA: Diagnosis not present

## 2017-05-09 DIAGNOSIS — Z79899 Other long term (current) drug therapy: Secondary | ICD-10-CM | POA: Diagnosis not present

## 2017-05-13 ENCOUNTER — Ambulatory Visit (INDEPENDENT_AMBULATORY_CARE_PROVIDER_SITE_OTHER): Payer: Medicare Other | Admitting: Physical Medicine and Rehabilitation

## 2017-05-15 ENCOUNTER — Telehealth: Payer: Self-pay | Admitting: Family Medicine

## 2017-05-15 ENCOUNTER — Ambulatory Visit (INDEPENDENT_AMBULATORY_CARE_PROVIDER_SITE_OTHER): Payer: Medicare Other | Admitting: Specialist

## 2017-05-15 NOTE — Telephone Encounter (Signed)
Copied from [place] [id number]. Topic: Quick Communication - See Telephone Encounter >> May 15, 2017  4:08 PM Percell Belt A wrote: CRM for notification. See Telephone encounter for:  pt daughter called in and said that pt uti started to feel better but now she is starting to feel the symptoms again.  She would like to know if she could get another round of antibiotics?  Without coming in?    Pharmacy -[facility]    Best number for daughter [phone]  05/15/17.

## 2017-05-16 MED ORDER — CIPROFLOXACIN HCL 500 MG PO TABS: 500.0000 mg | ORAL_TABLET | Freq: Two times a day (BID) | ORAL | 0 refills | Status: DC

## 2017-05-16 NOTE — Telephone Encounter (Signed)
Please call pt. She will need to at least come in to get a urine collection and I will call in abx to start after urine obtained. Lab appt only ok this time, but if does not resolve or culture is normal we will need to consider referral to urology.   - cipro prescribed

## 2017-05-16 NOTE — Telephone Encounter (Signed)
Spoke with patients daughter [name] she doesn't think its UTI she thinks it might be a flare of diverticulitis. Patient just c/o abdominal discomfort. Scheduled an appt for patient 05/20/17 at daughters request. Advised anita if her mom has fever nausea vomiting of increased abdominal pain report to the ER . [name] verbalized understanding.

## 2017-05-20 ENCOUNTER — Encounter: Payer: Self-pay | Admitting: Family Medicine

## 2017-05-20 ENCOUNTER — Ambulatory Visit (INDEPENDENT_AMBULATORY_CARE_PROVIDER_SITE_OTHER): Payer: Medicare Other | Admitting: Family Medicine

## 2017-05-20 VITALS — BP 152/65 | HR 72 | Temp 98.0°F | Resp 20 | Ht 64.0 in | Wt 196.0 lb

## 2017-05-20 DIAGNOSIS — R1031 Right lower quadrant pain: Secondary | ICD-10-CM | POA: Diagnosis not present

## 2017-05-20 DIAGNOSIS — K5730 Diverticulosis of large intestine without perforation or abscess without bleeding: Secondary | ICD-10-CM | POA: Diagnosis not present

## 2017-05-20 DIAGNOSIS — M353 Polymyalgia rheumatica: Secondary | ICD-10-CM

## 2017-05-20 DIAGNOSIS — R1030 Lower abdominal pain, unspecified: Secondary | ICD-10-CM

## 2017-05-20 LAB — POC URINALSYSI DIPSTICK (AUTOMATED)
BILIRUBIN UA: NEGATIVE
Glucose, UA: NEGATIVE
Ketones, UA: NEGATIVE
Leukocytes, UA: NEGATIVE
Nitrite, UA: NEGATIVE
PH UA: 6 (ref 5.0–8.0)
RBC UA: NEGATIVE
Spec Grav, UA: 1.02 (ref 1.010–1.025)
UROBILINOGEN UA: 0.2 U/dL

## 2017-05-20 LAB — COMPREHENSIVE METABOLIC PANEL
ALK PHOS: 55 U/L (ref 39–117)
ALT: 32 U/L (ref 0–35)
AST: 29 U/L (ref 0–37)
Albumin: 4.4 g/dL (ref 3.5–5.2)
BILIRUBIN TOTAL: 0.4 mg/dL (ref 0.2–1.2)
BUN: 20 mg/dL (ref 6–23)
CO2: 30 meq/L (ref 19–32)
CREATININE: 0.91 mg/dL (ref 0.40–1.20)
Calcium: 10.2 mg/dL (ref 8.4–10.5)
Chloride: 102 mEq/L (ref 96–112)
GFR: 62.31 mL/min (ref >60.00)
GLUCOSE: 106 mg/dL — AB (ref 70–99)
Potassium: 5 mEq/L (ref 3.5–5.1)
Sodium: 139 mEq/L (ref 135–145)
TOTAL PROTEIN: 6.6 g/dL (ref 6.0–8.3)

## 2017-05-20 LAB — CBC WITH DIFFERENTIAL/PLATELET
BASOS ABS: 0.1 10*3/uL (ref 0.0–0.1)
Basophils Relative: 0.6 % (ref 0.0–3.0)
EOS ABS: 0.1 10*3/uL (ref 0.0–0.7)
Eosinophils Relative: 1 % (ref 0.0–5.0)
HCT: 46.9 % — ABNORMAL HIGH (ref 36.0–46.0)
Hemoglobin: 15.8 g/dL — ABNORMAL HIGH (ref 12.0–15.0)
LYMPHS PCT: 12.1 % (ref 12.0–46.0)
Lymphs Abs: 1.2 10*3/uL (ref 0.7–4.0)
MCHC: 33.7 g/dL (ref 30.0–36.0)
MCV: 97.2 fl (ref 78.0–100.0)
MONOS PCT: 4.3 % (ref 3.0–12.0)
Monocytes Absolute: 0.4 10*3/uL (ref 0.1–1.0)
NEUTROS ABS: 8.4 10*3/uL — AB (ref 1.4–7.7)
NEUTROS PCT: 82 % — AB (ref 43.0–77.0)
PLATELETS: 213 10*3/uL (ref 150.0–400.0)
RBC: 4.83 Mil/uL (ref 3.87–5.11)
RDW: 13.2 % (ref 11.5–15.5)
WBC: 10.3 10*3/uL (ref 4.0–10.5)

## 2017-05-20 LAB — C-REACTIVE PROTEIN: CRP: <0.1 mg/dL — ABNORMAL LOW (ref 0.5–20.0)

## 2017-05-20 LAB — SEDIMENTATION RATE: Sed Rate: 4 mm/hr (ref 0–30)

## 2017-05-20 NOTE — Progress Notes (Signed)
[name] , Jan 19, 1930, 84 y.o., female MRN: [id number] Patient Care Team    Relationship Specialty Notifications Start End  Sally Hillock, DO PCP - General Family Medicine  03/16/15   Sally Medicus, MD  Internal Medicine  07/11/15   Sally Records, MD Consulting Physician Cardiology  07/11/15   Sally Dragon, MD (Inactive)  Gastroenterology  07/11/15   Sally Rockers, MD Consulting Physician Pulmonary Disease  07/11/15   Sally Oto, MD Consulting Physician Orthopedic Surgery  07/12/16   Sally Sinning, MD Consulting Physician Physical Medicine and Rehabilitation  07/12/16     Chief Complaint  Patient presents with  . Abdominal Pain    right side     Subjective:  [name] is a 84 y.o. year female presents today with RLQ abd pain.  She has been treated for Culture proven UTI (proteus) and reports initial  Improvement after treatment, then symptoms returned with urine culture negative. She reports the discomfort has steadily worsened in her RLQ over this time, which started > 6 weeks ago. She denies fever, chills, nausea or vomit. Dysuria has resolved. She had mild improvement with cirpo prophylaxis tx for last suspected UTI. She has started to eat a higher fiber diet again. Pt has a h/o diverticulosis.   Prior note:  Pt presents for an OV after treatment for P. Mirabilis UTI with 7 day course of keflex. Pt reports she is feeling better, but small amount of dysuria remains. She denies fever, chills, nausea, vomit. She states she had felt better during the abx, but towards the end of pills and over the last week without abx she has noticed her dysuria reoccurring.    Depression screen [facility] 2/9 04/01/2017 01/28/2017 07/12/2016 07/11/2015 03/16/2015  Decreased Interest 0 0 0 0 0  Down, Depressed, Hopeless 0 0 0 0 0  PHQ - 2 Score 0 0 0 0 0  Altered sleeping 0 1 - - -  Tired, decreased energy 0 1 - - -  Change in appetite 0 0 - - -  Feeling bad or failure about yourself   0 0 - - -  Trouble concentrating 0 0 - - -  Moving slowly or fidgety/restless 0 - - - -  Suicidal thoughts 0 0 - - -  PHQ-9 Score 0 2 - - -    Allergies  Allergen Reactions  . Atorvastatin Other (See Comments)    Severe headache  . Statins   . Advair Diskus [Fluticasone-Salmeterol] Other (See Comments)    Mouth sores  . Sulfonamide Derivatives Rash   Social History   Tobacco Use  . Smoking status: Never Smoker  . Smokeless tobacco: Never Used  Substance Use Topics  . Alcohol use: No   Past Medical History:  Diagnosis Date  . Abdominal bloating   . Abnormal LFTs (liver function tests)   . Anxiety disorder   . Arthritis   . Asthma   . Depression   . Diverticulosis   . Edema   . Fatty liver   . Fibromyalgia   . GERD (gastroesophageal reflux disease)   . Giant cell arteritis (HCC)    No biopsy secondary to steroid use  . Hx of adenomatous colonic polyps   . Hyperlipidemia   . Hypertension   . IBS (irritable bowel syndrome)   . Lumbar radiculitis   . Peptic ulcer disease   . Skin cancer    shoulder, leg, right eye (? squamous by resection)  .  Thoracic radiculitis   . Thyroid nodule    "removed"  . Urinary incontinence   . Vertigo    Past Surgical History:  Procedure Laterality Date  . ABDOMINAL HYSTERECTOMY    . BACK SURGERY  2005   lower back  . EYE SURGERY Right 2006  . LIVER BIOPSY     fatty liver  . NASAL SEPTUM SURGERY    . right knee surgery  2006   arthroscopy  . right leg skin cancer surgery  2014  . right shoulder skin cancer excision  2012  . THYROIDECTOMY     Family History  Problem Relation Age of Onset  . Colon polyps Maternal Aunt   . Diabetes Father   . Heart disease Father   . Heart disease Mother   . Lung disease Mother   . COPD Mother   . Lung cancer Brother   . Cancer Brother        lung  . COPD Brother   . Stroke Brother   . Lung cancer Sister   . COPD Sister   . Kidney disease Unknown        niece-mat side  .  Arthritis Unknown   . Diabetes Son   . Arthritis Daughter   . Colon cancer Neg Hx    Allergies as of 05/20/2017      Reactions   Atorvastatin Other (See Comments)   Severe headache   Statins    Advair Diskus [fluticasone-salmeterol] Other (See Comments)   Mouth sores   Sulfonamide Derivatives Rash      Medication List        Accurate as of 05/20/17  1:28 PM. Always use your most recent med list.          albuterol 108 (90 Base) MCG/ACT inhaler Commonly known as:  PROAIR HFA 2 puffs up to every 4 hours if can't catch your breath   alendronate 70 MG tablet Commonly known as:  FOSAMAX Take 70 mg by mouth once a week. Take with a full glass of water on an empty stomach.   aspirin 81 MG tablet Take 81 mg by mouth daily.   budesonide-formoterol 80-4.5 MCG/ACT inhaler Commonly known as:  SYMBICORT Inhale 2 puffs into the lungs 2 (two) times daily.   diazepam 5 MG tablet Commonly known as:  VALIUM Take 0.5 tablets (2.5 mg total) by mouth every 12 (twelve) hours as needed for anxiety. Take 1/2 tablet by mouth twice daily as needed for back spasms   ferrous sulfate 325 (65 FE) MG tablet Take 1 tablet (325 mg total) by mouth 3 (three) times daily with meals.   FLUoxetine 20 MG capsule Commonly known as:  PROZAC Take 3 capsules (60 mg total) by mouth daily.   lidocaine 5 % Commonly known as:  LIDODERM Place 1 patch daily onto the skin. Remove & Discard patch within 12 hours or as directed by MD   olmesartan 20 MG tablet Commonly known as:  BENICAR Take 1.5 tablets (30 mg total) daily by mouth.   predniSONE 5 MG tablet Commonly known as:  DELTASONE Take 7.5 mg daily by mouth.   tocilizumab 4 mg/kg in sodium chloride 0.9 % Inject 280 mg into the vein.   traMADol-acetaminophen 37.5-325 MG tablet Commonly known as:  ULTRACET Take 1-2 tablets by mouth every 6 (six) hours as needed.       All past medical history, surgical history, allergies, family history,  immunizations andmedications were updated in the EMR today and  reviewed under the history and medication portions of their EMR.     ROS: Negative, with the exception of above mentioned in HPI   Objective:  BP (!) 152/65 (BP Location: Right Arm, Patient Position: Sitting, Cuff Size: Large)   Pulse 72   Temp 98 F (36.7 C)   Resp 20   Ht '5\' 4"'  (1.626 m)   Wt 196 lb (88.9 kg)   SpO2 96%   BMI 33.64 kg/m  Body mass index is 33.64 kg/m. Gen: Afebrile. No acute distress. Nontoxic in appearance. Well developed, well nourished female. Very pleasant.   HENT: AT. [place].  MMM.  Eyes:Pupils Equal Round Reactive to light, Extraocular movements intact,  Conjunctiva without redness, discharge or icterus. CV: RRR  Chest: CTAB, no wheeze or crackles Abd: Soft. Obese. Mildly distended. TTP mild left suprapubic, moderate with guarding RLQ.  BS hyperactive. no Masses palpated. Body habitus makes exam difficult.  Neuro:  Normal gait. PERLA. EOMi. Alert. Oriented x3   No exam data present No results found. Results for orders placed or performed in visit on 05/20/17 (from the past 24 hour(s))  POCT Urinalysis Dipstick (Automated)     Status: None   Collection Time: 05/20/17  1:07 PM  Result Value Ref Range   Color, UA yellow    Clarity, UA clear    Glucose, UA negative    Bilirubin, UA negative    Ketones, UA negative    Spec Grav, UA 1.020 1.010 - 1.025   Blood, UA negative    pH, UA 6.0 5.0 - 8.0   Protein, UA 16m    Urobilinogen, UA 0.2 0.2 or 1.0 E.U./dL   Nitrite, UA negative    Leukocytes, UA Negative Negative    Assessment/Plan: JAilee Patesis a 84y.o. female present for OV for chronic RLQ abd pain now > 6 weeks duration.  Right lower quadrant abdominal pain Diverticulosis of large intestine without hemorrhage - despite proven tx of UTI pt continues to have RLQ pain that is worsening > 6 weeks. Suspect either diverticulosis or possible nephrolithiasis given UTI was caused by   proteus. Her abd is rather tender on exam today. Will complete labs r/o infectious/inflammatory causes and obtain a CT of abd/pelvis.  - CBC w/Diff - Comp Met (CMET) - Sedimentation rate - C-reactive protein - CT Abdomen Pelvis W Contrast; Future - will wait to tx w/ additional abx only if labs/image indicate need or pt develops fever.   [place] managed by rheumatology. We will forward labs to Dr. ZEarnest Conroycollected today to prevent pt from having additional lab draws.     Reviewed expectations re: course of current medical issues.  Discussed self-management of symptoms.  Outlined signs and symptoms indicating need for more acute intervention.  Patient verbalized understanding and all questions were answered.  Patient received an After-Visit Summary.    Orders Placed This Encounter  Procedures  . CT Abdomen Pelvis W Contrast  . CBC w/Diff  . Comp Met (CMET)  . Sedimentation rate  . C-reactive protein  . POCT Urinalysis Dipstick (Automated)     Note is dictated utilizing voice recognition software. Although note has been proof read prior to signing, occasional typographical errors still can be missed. If any questions arise, please do not hesitate to call for verification.   electronically signed by:  RHoward Pouch DO  [place]

## 2017-05-20 NOTE — Patient Instructions (Signed)
It was a pleasure to see you today.  I have ordered labs and once results received will call you. We will also forward results to Dr. Earnest Conroy.   I have ordered a CT of your abdomen since the pain has been present so long and getting worse. They will call you to schedule.    I want to wait on antibiotics until we see you have an infection either by lab or CT results.     Diverticulitis Diverticulitis is when small pockets in your large intestine (colon) get infected or swollen. This causes stomach pain and watery poop (diarrhea). These pouches are called diverticula. They form in people who have a condition called diverticulosis. Follow these instructions at home: Medicines  Take over-the-counter and prescription medicines only as told by your doctor. These include: ? Antibiotics. ? Pain medicines. ? Fiber pills. ? Probiotics. ? Stool softeners.  Do not drive or use heavy machinery while taking prescription pain medicine.  If you were prescribed an antibiotic, take it as told. Do not stop taking it even if you feel better. General instructions  Follow a diet as told by your doctor.  When you feel better, your doctor may tell you to change your diet. You may need to eat a lot of fiber. Fiber makes it easier to poop (have bowel movements). Healthy foods with fiber include: ? Berries. ? Beans. ? Lentils. ? Green vegetables.  Exercise 3 or more times a week. Aim for 30 minutes each time. Exercise enough to sweat and make your heart beat faster.  Keep all follow-up visits as told. This is important. You may need to have an exam of the large intestine. This is called a colonoscopy. Contact a doctor if:  Your pain does not get better.  You have a hard time eating or drinking.  You are not pooping like normal. Get help right away if:  Your pain gets worse.  Your problems do not get better.  Your problems get worse very fast.  You have a fever.  You throw up (vomit) more than  one time.  You have poop that is: ? Bloody. ? Black. ? Tarry. Summary  Diverticulitis is when small pockets in your large intestine (colon) get infected or swollen.  Take medicines only as told by your doctor.  Follow a diet as told by your doctor. This information is not intended to replace advice given to you by your health care provider. Make sure you discuss any questions you have with your health care provider. Document Released: 08/21/2007 Document Revised: 03/21/2016 Document Reviewed: 03/21/2016 Elsevier Interactive Patient Education  2017 [employer].

## 2017-05-21 ENCOUNTER — Encounter: Payer: Self-pay | Admitting: *Deleted

## 2017-05-21 ENCOUNTER — Ambulatory Visit
Admission: RE | Admit: 2017-05-21 | Discharge: 2017-05-21 | Disposition: A | Discharge status: Home / Self Care | Payer: Medicare Other | Source: Ambulatory Visit | Attending: Family Medicine | Admitting: Family Medicine

## 2017-05-21 ENCOUNTER — Telehealth: Payer: Self-pay | Admitting: Family Medicine

## 2017-05-21 DIAGNOSIS — K5730 Diverticulosis of large intestine without perforation or abscess without bleeding: Secondary | ICD-10-CM | POA: Diagnosis not present

## 2017-05-21 DIAGNOSIS — R1031 Right lower quadrant pain: Secondary | ICD-10-CM

## 2017-05-21 MED ORDER — IOPAMIDOL (ISOVUE-300) INJECTION 61%
100.0000 mL | Freq: Once | INTRAVENOUS | Status: AC | PRN
  Administered 2017-05-21: 100 mL via INTRAVENOUS

## 2017-05-21 MED ORDER — METRONIDAZOLE 500 MG PO TABS: 500.0000 mg | ORAL_TABLET | Freq: Three times a day (TID) | ORAL | 0 refills | Status: DC

## 2017-05-21 MED ORDER — CIPROFLOXACIN HCL 500 MG PO TABS: 500.0000 mg | ORAL_TABLET | Freq: Two times a day (BID) | ORAL | 0 refills | Status: DC

## 2017-05-21 NOTE — Telephone Encounter (Signed)
Left detailed message with results and instructions on patient voice mail per DPR 

## 2017-05-21 NOTE — Telephone Encounter (Signed)
Please inform patient the following information: Her labs look good. Liver function returned to normal. The size of the red blood cells returned to normal. Her inflammatory markers are normal.  She does have a very mild shift in her WBC which could mean a sign of infection, so I will start treatment with cipro/flagyl for 7 days for presumed diverticulosis. I have also ordered the CT of her abd to further investigate RLQ pain. Flagyl can cause nausea in some, make certain to take with food.

## 2017-05-22 ENCOUNTER — Telehealth: Payer: Self-pay | Admitting: Family Medicine

## 2017-05-22 DIAGNOSIS — M316 Other giant cell arteritis: Secondary | ICD-10-CM | POA: Diagnosis not present

## 2017-05-22 DIAGNOSIS — Z79899 Other long term (current) drug therapy: Secondary | ICD-10-CM | POA: Diagnosis not present

## 2017-05-22 DIAGNOSIS — R748 Abnormal levels of other serum enzymes: Secondary | ICD-10-CM | POA: Diagnosis not present

## 2017-05-22 NOTE — Telephone Encounter (Signed)
Please inform patient the following information: Her CT looks good. She has some known diverticulosis, but it does not appear inflamed on CT. Her appendix and anatomy on that side of her abdomen is otherwise normal.   Recs: Finish med if improving. This may have been a mild flare that started to improve already and not caught on CT or this may be referred pain from her back.

## 2017-05-22 NOTE — Telephone Encounter (Signed)
Spoke with patients daughter [name] reviewed CT results ,information and instructions. [name] verbalized understanding.

## 2017-06-10 ENCOUNTER — Ambulatory Visit (INDEPENDENT_AMBULATORY_CARE_PROVIDER_SITE_OTHER): Payer: Medicare Other | Admitting: Physical Medicine and Rehabilitation

## 2017-06-12 DIAGNOSIS — Z79899 Other long term (current) drug therapy: Secondary | ICD-10-CM | POA: Diagnosis not present

## 2017-06-12 DIAGNOSIS — M316 Other giant cell arteritis: Secondary | ICD-10-CM | POA: Diagnosis not present

## 2017-06-22 ENCOUNTER — Other Ambulatory Visit: Payer: Self-pay | Admitting: Family Medicine

## 2017-06-25 DIAGNOSIS — H35033 Hypertensive retinopathy, bilateral: Secondary | ICD-10-CM | POA: Diagnosis not present

## 2017-06-25 DIAGNOSIS — H04213 Epiphora due to excess lacrimation, bilateral lacrimal glands: Secondary | ICD-10-CM | POA: Diagnosis not present

## 2017-06-25 DIAGNOSIS — Z961 Presence of intraocular lens: Secondary | ICD-10-CM | POA: Diagnosis not present

## 2017-06-25 DIAGNOSIS — H353132 Nonexudative age-related macular degeneration, bilateral, intermediate dry stage: Secondary | ICD-10-CM | POA: Diagnosis not present

## 2017-06-26 ENCOUNTER — Ambulatory Visit (INDEPENDENT_AMBULATORY_CARE_PROVIDER_SITE_OTHER): Payer: Medicare Other | Admitting: Specialist

## 2017-06-26 ENCOUNTER — Encounter (INDEPENDENT_AMBULATORY_CARE_PROVIDER_SITE_OTHER): Payer: Self-pay | Admitting: Specialist

## 2017-06-26 VITALS — BP 152/82 | HR 82 | Ht 64.0 in | Wt 196.0 lb

## 2017-06-26 DIAGNOSIS — M5116 Intervertebral disc disorders with radiculopathy, lumbar region: Secondary | ICD-10-CM

## 2017-06-26 MED ORDER — TRAMADOL-ACETAMINOPHEN 37.5-325 MG PO TABS: 1.0000 | ORAL_TABLET | Freq: Four times a day (QID) | ORAL | 0 refills | Status: DC | PRN

## 2017-06-26 NOTE — Patient Instructions (Signed)
Avoid bending, stooping and avoid lifting weights greater than 10 lbs. Avoid prolong standing and walking. Avoid frequent bending and stooping  No lifting greater than 10 lbs. May use ice or moist heat for pain. Weight loss is of benefit. Handicap license is approved. Dr. Newton's secretary/Assistant will call to arrange for epidural steroid injection  

## 2017-06-26 NOTE — Progress Notes (Signed)
Office Visit Note   Patient: [name]           Date of Birth: 05-12-1929           MRN: [id number] Visit Date: 06/26/2017              Requested by: Ma Hillock, DO [address] [place], [place] [address] PCP: Ma Hillock, DO   Assessment & Plan: Visit Diagnoses:  1. Herniation of lumbar intervertebral disc with radiculopathy     Plan:Avoid bending, stooping and avoid lifting weights greater than 10 lbs. Avoid prolong standing and walking. Avoid frequent bending and stooping  No lifting greater than 10 lbs. May use ice or moist heat for pain. Weight loss is of benefit. Handicap license is approved. Dr. Romona Curls secretary/Assistant will call to arrange for epidural steroid injection   Follow-Up Instructions: Return in about 1 month (around 07/24/2017).   Orders:  Orders Placed This Encounter  Procedures  . Ambulatory referral to Physical Medicine Rehab   Meds ordered this encounter  Medications  . traMADol-acetaminophen (ULTRACET) 37.5-325 MG tablet    Sig: Take 1-2 tablets by mouth every 6 (six) hours as needed.    Dispense:  30 tablet    Refill:  0      Procedures: No procedures performed   Clinical Data: No additional findings.   Subjective: No chief complaint on file.   84 year old female with history of back pain and radiation into the right groin. She has been experiencing pain with jarring her back and she has pain in the right lower abdomenal  Wall. She has found that ice helps but has not been able to obtain a TENS unit as yet to see if this with work.   Review of Systems  Constitutional: Negative.   HENT: Negative.   Eyes: Negative.   Respiratory: Negative.   Cardiovascular: Negative.   Gastrointestinal: Negative.   Endocrine: Negative.   Genitourinary: Negative.   Musculoskeletal: Negative.   Skin: Negative.   Allergic/Immunologic: Negative.   Neurological: Negative.   Hematological: Negative.   Psychiatric/Behavioral:  Negative.      Objective: Vital Signs: BP (!) 152/82 (BP Location: Left Arm, Patient Position: Sitting)   Pulse 82   Ht 5\' 4"  (1.626 m)   Wt 196 lb (88.9 kg)   BMI 33.64 kg/m   Physical Exam  Constitutional: She is oriented to person, place, and time. She appears well-developed and well-nourished.  HENT:  Head: Normocephalic and atraumatic.  Eyes: Pupils are equal, round, and reactive to light. EOM are normal.  Neck: Normal range of motion. Neck supple.  Pulmonary/Chest: Effort normal and breath sounds normal.  Abdominal: Soft. Bowel sounds are normal.  Neurological: She is alert and oriented to person, place, and time.  Skin: Skin is warm and dry.  Psychiatric: She has a normal mood and affect. Her behavior is normal. Judgment and thought content normal.    Back Exam   Tenderness  The patient is experiencing tenderness in the lumbar.  Range of Motion  Extension: abnormal  Flexion: abnormal  Lateral bend right: abnormal  Lateral bend left: abnormal  Rotation right: abnormal  Rotation left: abnormal   Muscle Strength  Right Quadriceps:  5/5  Left Quadriceps:  5/5  Right Hamstrings:  5/5  Left Hamstrings:  5/5   Tests  Straight leg raise right: negative Straight leg raise left: negative  Reflexes  Patellar: normal Achilles: normal Biceps: normal Babinski's sign: normal  Other  Toe walk: normal Heel walk: normal Sensation: normal Gait: normal  Erythema: no back redness Scars: absent      Specialty Comments:  No specialty comments available.  Imaging: No results found.   PMFS History: Patient Active Problem List   Diagnosis Date Noted  . Elevated hemoglobin ([place]) 04/04/2017  . Elevated liver enzymes 04/04/2017  . Chronic back pain 01/28/2017  . Iron deficiency 01/28/2017  . Morbid obesity ([place]) 01/28/2017  . Peptic ulcer disease   . Lumbar radiculitis   . Frequent falls 08/02/2016  . Hypertensive retinopathy of both eyes 09/07/2015  .  Medicare annual wellness visit, subsequent 07/11/2015  . Long-term use of high-risk medication 07/11/2015  . Osteopenia 04/11/2015  . Depression with anxiety 04/11/2015  . Giant cell arteritis ([place]) 03/28/2015  . Polymyalgia rheumatica ([place]) 03/16/2015  . Chronic narcotic use 03/16/2015  . Anemia, iron deficiency 07/14/2013  . Asthma, moderate persistent 10/15/2012  . Hyperlipidemia 12/27/2008  . HYPERTENSION, BENIGN 12/27/2008   Past Medical History:  Diagnosis Date  . Abdominal bloating   . Abnormal LFTs (liver function tests)   . Anxiety disorder   . Arthritis   . Asthma   . Depression   . Diverticulosis   . Edema   . Fatty liver   . Fibromyalgia   . GERD (gastroesophageal reflux disease)   . Giant cell arteritis (HCC)    No biopsy secondary to steroid use  . Hx of adenomatous colonic polyps   . Hyperlipidemia   . Hypertension   . IBS (irritable bowel syndrome)   . Lumbar radiculitis   . Peptic ulcer disease   . Skin cancer    shoulder, leg, right eye (? squamous by resection)  . Thoracic radiculitis   . Thyroid nodule    "removed"  . Urinary incontinence   . Vertigo     Family History  Problem Relation Age of Onset  . Colon polyps Maternal Aunt   . Diabetes Father   . Heart disease Father   . Heart disease Mother   . Lung disease Mother   . COPD Mother   . Lung cancer Brother   . Cancer Brother        lung  . COPD Brother   . Stroke Brother   . Lung cancer Sister   . COPD Sister   . Kidney disease Unknown        niece-mat side  . Arthritis Unknown   . Diabetes Son   . Arthritis Daughter   . Colon cancer Neg Hx     Past Surgical History:  Procedure Laterality Date  . ABDOMINAL HYSTERECTOMY    . BACK SURGERY  2005   lower back  . EYE SURGERY Right 2006  . LIVER BIOPSY     fatty liver  . NASAL SEPTUM SURGERY    . right knee surgery  2006   arthroscopy  . right leg skin cancer surgery  2014  . right shoulder skin cancer excision  2012  .  THYROIDECTOMY     Social History   Occupational History  . Occupation: Retired Water engineer  Tobacco Use  . Smoking status: Never Smoker  . Smokeless tobacco: Never Used  Substance and Sexual Activity  . Alcohol use: No  . Drug use: No  . Sexual activity: Never

## 2017-07-10 DIAGNOSIS — Z79899 Other long term (current) drug therapy: Secondary | ICD-10-CM | POA: Diagnosis not present

## 2017-07-10 DIAGNOSIS — M316 Other giant cell arteritis: Secondary | ICD-10-CM | POA: Diagnosis not present

## 2017-07-14 NOTE — Progress Notes (Addendum)
Subjective:   [name] is a 84 y.o. female who presents for Medicare Annual (Subsequent) preventive examination.  Review of Systems:  No ROS.  Medicare Wellness Visit. Additional risk factors are reflected in the social history.  Cardiac Risk Factors include: advanced age (>47men, >51 women);dyslipidemia;hypertension;obesity (BMI >30kg/m2);sedentary lifestyle;family history of premature cardiovascular disease   Sleep patterns: Sleeps 7-8 hours, up to void x 2.  Home Safety/Smoke Alarms: Feels safe in home. Smoke alarms in place.  Living environment; residence and Firearm Safety: Lives with daughter (1 story home) part time and son stays with patient part time (1 story with basement, electric chair to access. Feels safe in homes. Plans to get emergency response button (Freedom Alert).  Seat Belt Safety/Bike Helmet: Wears seat belt.   Female:   Pap-N/A      Mammo-Last > 5 years. Declines further testing.        Dexa scan-07/21/2015, Osteopenia. Ordered today by PCP, [facility].        CCS-Colonoscopy 07/02/2013, polyps. No recall d/t age.       Objective:     Vitals: Pulse 80   Temp 98 F (36.7 C)   Resp 20   Ht 5\' 4"  (1.626 m)   Wt 198 lb (89.8 kg)   SpO2 93%   BMI 33.99 kg/m   Body mass index is 33.99 kg/m.  Advanced Directives 07/15/2017 01/17/2017 07/12/2016 07/02/2013  Does Patient Have a Medical Advance Directive? No No No Patient has advance directive, copy not in chart  Would patient like information on creating a medical advance directive? Yes (MAU/Ambulatory/Procedural Areas - Information given) - Yes (MAU/Ambulatory/Procedural Areas - Information given) -    Tobacco Social History   Tobacco Use  Smoking Status Never Smoker  Smokeless Tobacco Never Used     Counseling given: Not Answered    Past Medical History:  Diagnosis Date  . Abdominal bloating   . Abnormal LFTs (liver function tests)   . Anxiety disorder   . Arthritis   . Asthma   . Depression    . Diverticulosis   . Edema   . Fatty liver   . Fibromyalgia   . GERD (gastroesophageal reflux disease)   . Giant cell arteritis (HCC)    No biopsy secondary to steroid use  . Hx of adenomatous colonic polyps   . Hyperlipidemia   . Hypertension   . IBS (irritable bowel syndrome)   . Lumbar radiculitis   . Peptic ulcer disease   . Skin cancer    shoulder, leg, right eye (? squamous by resection)  . Thoracic radiculitis   . Thyroid nodule    "removed"  . Urinary incontinence   . Vertigo    Past Surgical History:  Procedure Laterality Date  . ABDOMINAL HYSTERECTOMY    . BACK SURGERY  2005   lower back  . EYE SURGERY Right 2006  . LIVER BIOPSY     fatty liver  . NASAL SEPTUM SURGERY    . right knee surgery  2006   arthroscopy  . right leg skin cancer surgery  2014  . right shoulder skin cancer excision  2012  . THYROIDECTOMY     Family History  Problem Relation Age of Onset  . Colon polyps Maternal Aunt   . Diabetes Father   . Heart disease Father   . Heart disease Mother   . Lung disease Mother   . COPD Mother   . Lung cancer Brother   . Cancer Brother  lung  . COPD Brother   . Stroke Brother   . Lung cancer Sister   . COPD Sister   . Kidney disease Unknown        niece-mat side  . Arthritis Unknown   . Diabetes Son   . Arthritis Daughter   . Colon cancer Neg Hx    Social History   Socioeconomic History  . Marital status: Married    Spouse name: Not on file  . Number of children: 3  . Years of education: Not on file  . Highest education level: Not on file  Occupational History  . Occupation: Retired Water engineer  Social Needs  . Financial resource strain: Not on file  . Food insecurity:    Worry: Not on file    Inability: Not on file  . Transportation needs:    Medical: Not on file    Non-medical: Not on file  Tobacco Use  . Smoking status: Never Smoker  . Smokeless tobacco: Never Used  Substance and Sexual Activity  .  Alcohol use: No  . Drug use: No  . Sexual activity: Never  Lifestyle  . Physical activity:    Days per week: Not on file    Minutes per session: Not on file  . Stress: Not on file  Relationships  . Social connections:    Talks on phone: Not on file    Gets together: Not on file    Attends religious service: Not on file    Active member of club or organization: Not on file    Attends meetings of clubs or organizations: Not on file    Relationship status: Not on file  Other Topics Concern  . Not on file  Social History Narrative   Married, husband Armed forces training and education officer.    Lives with [name] and [name].   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   Patient takes a daily vitamin   She wears her seat belt. Exercises greater than 3 times a week.   Wears 2 hearing aids. No dentures.   "Sometimes "requires assistive device for walking, either a walker or wheelchair.   There is a smoke detector in her home. There are firearms in her home. She feels safe in her relationships.    Outpatient Encounter Medications as of 07/15/2017  Medication Sig  . albuterol (PROAIR HFA) 108 (90 Base) MCG/ACT inhaler 2 puffs up to every 4 hours if can't catch your breath  . alendronate (FOSAMAX) 70 MG tablet Take 70 mg by mouth once a week. Take with a full glass of water on an empty stomach.  Marland Kitchen aspirin 81 MG tablet Take 81 mg by mouth daily.    . diazepam (VALIUM) 5 MG tablet Take 0.5 tablets (2.5 mg total) by mouth every 12 (twelve) hours as needed for anxiety. Take 1/2 tablet by mouth twice daily as needed for back spasms  . ferrous sulfate 325 (65 FE) MG tablet Take 1 tablet (325 mg total) by mouth 3 (three) times daily with meals.  Marland Kitchen FLUoxetine (PROZAC) 20 MG capsule Take 3 capsules (60 mg total) by mouth daily.  Marland Kitchen lidocaine (LIDODERM) 5 % Place 1 patch daily onto the skin. Remove & Discard patch within 12 hours or as directed by MD  . predniSONE (DELTASONE) 5 MG tablet Take 7.5 mg daily by mouth.   . SYMBICORT  80-4.5 MCG/ACT inhaler USE 2 INHALATIONS TWICE A DAY  . tocilizumab 4 mg/kg in sodium chloride 0.9 % Inject 280 mg  into the vein.  Marland Kitchen traMADol-acetaminophen (ULTRACET) 37.5-325 MG tablet Take 1-2 tablets by mouth every 6 (six) hours as needed.  . Zoster Vaccine Adjuvanted [facility]) injection Inject 0.5 mLs into the muscle once for 1 dose.  . [DISCONTINUED] ciprofloxacin (CIPRO) 500 MG tablet Take 1 tablet (500 mg total) by mouth 2 (two) times daily.  . [DISCONTINUED] metroNIDAZOLE (FLAGYL) 500 MG tablet Take 1 tablet (500 mg total) by mouth 3 (three) times daily.  . [DISCONTINUED] olmesartan (BENICAR) 20 MG tablet Take 1.5 tablets (30 mg total) daily by mouth.   No facility-administered encounter medications on file as of 07/15/2017.     Activities of Daily Living In your present state of health, do you have any difficulty performing the following activities: 07/15/2017  Hearing? N  Vision? N  Difficulty concentrating or making decisions? N  Walking or climbing stairs? Y  Comment Uses walker and wheelchair with long distant walking and standing  Dressing or bathing? N  Doing errands, shopping? N  Preparing Food and eating ? N  Using the Toilet? N  In the past six months, have you accidently leaked urine? N  Do you have problems with loss of bowel control? N  Managing your Medications? Y  Managing your Finances? Y  Housekeeping or managing your Housekeeping? N  Some recent data might be hidden    Patient Care Team: Ma Hillock, DO as PCP - General (Family Medicine) Hermelinda Medicus, MD (Internal Medicine) Fay Records, MD as Consulting Physician (Cardiology) Lafayette Dragon, MD (Inactive) (Gastroenterology) Tanda Rockers, MD as Consulting Physician (Pulmonary Disease) Jessy Oto, MD as Consulting Physician (Orthopedic Surgery) Magnus Sinning, MD as Consulting Physician (Physical Medicine and Rehabilitation) [facility] [place] (Dentistry)    Assessment:    This is a routine wellness examination for [place].  Exercise Activities and Dietary recommendations Current Exercise Habits: Home exercise routine(chair exercises. ), Exercise limited by: orthopedic condition(s);neurologic condition(s)   Diet (meal preparation, eat out, water intake, caffeinated beverages, dairy products, fruits and vegetables): Drinks water and Gatorade.   Breakfast: shredded wheat cereal; eggs; toast; coffee (1-2 cups) Lunch: leftovers Dinner: protein and vegetables.     Goals    . Weight (lb) < 190 lb (86.2 kg)     Lose weight by increasing activity.        Fall Risk Fall Risk  07/15/2017 10/24/2016 07/12/2016 07/11/2015 03/16/2015  Falls in the past year? Yes Yes Yes Yes No  Number falls in past yr: 2 or more 2 or more 2 or more 2 or more -  Injury with Fall? No Yes No No -  Risk Factor Category  High Fall Risk High Fall Risk - High Fall Risk -  Risk for fall due to : History of fall(s);Impaired balance/gait - Impaired balance/gait History of fall(s) Impaired balance/gait  Follow up Falls prevention discussed Falls evaluation completed;Falls prevention discussed;Education provided Falls prevention discussed Falls evaluation completed;Falls prevention discussed;Education provided -    Depression Screen PHQ 2/9 Scores 07/15/2017 07/15/2017 04/01/2017 01/28/2017  PHQ - 2 Score 0 0 0 0  PHQ- 9 Score 0 1 0 2     Cognitive Function MMSE - Mini Mental State Exam 07/15/2017 07/12/2016  Not completed: - Refused  Orientation to time 5 -  Orientation to Place 5 -  Registration 3 -  Attention/ Calculation 3 -  Recall 1 -  Language- name 2 objects 2 -  Language- repeat 0 -  Language- follow 3 step command 3 -  Language- read & follow direction 1 -  Write a sentence 1 -  Copy design 1 -  Total score 25 -   Provided "search a word" book.       Immunization History  Administered Date(s) Administered  . Influenza Split 12/16/2012, 12/30/2013  . Influenza Whole  12/17/2011  . Influenza, High Dose Seasonal PF 12/12/2015, 12/24/2016  . Pneumococcal Conjugate-13 12/17/2010  . Pneumococcal Polysaccharide-23 06/29/2008, 12/30/2013  . Pneumococcal-Unspecified 12/17/2010, 12/30/2013  . Tdap 04/11/2015     Screening Tests Health Maintenance  Topic Date Due  . PNA vac Low Risk Adult (2 of 2 - PCV13) 08/14/2017 (Originally 12/31/2014)  . DEXA SCAN  07/20/2017  . INFLUENZA VACCINE  10/16/2017  . TETANUS/TDAP  04/10/2025       Plan:    Shingles vaccine at pharmacy.   Schedule bone scan.   Bring a copy of your living will and/or healthcare power of attorney to your next office visit.  Continue doing brain stimulating activities (puzzles, reading, adult coloring books, staying active) to keep memory sharp.   I have personally reviewed and noted the following in the patient's chart:   . Medical and social history . Use of alcohol, tobacco or illicit drugs  . Current medications and supplements . Functional ability and status . Nutritional status . Physical activity . Advanced directives . List of other physicians . Hospitalizations, surgeries, and ER visits in previous 12 months . Vitals . Screenings to include cognitive, depression, and falls . Referrals and appointments  In addition, I have reviewed and discussed with patient certain preventive protocols, quality metrics, and best practice recommendations. A written personalized care plan for preventive services as well as general preventive health recommendations were provided to patient.     Gerilyn Nestle, RN  07/15/2017  Medical screening examination/treatment/procedure(s) were performed by non-physician practitioner and as supervising physician I was immediately available for consultation/collaboration.  I agree with above assessment and plan.  Electronically Signed by: Howard Pouch, DO [place] [facility] [place]

## 2017-07-15 ENCOUNTER — Encounter (INDEPENDENT_AMBULATORY_CARE_PROVIDER_SITE_OTHER): Payer: Self-pay | Admitting: Physical Medicine and Rehabilitation

## 2017-07-15 ENCOUNTER — Encounter: Payer: Self-pay | Admitting: Family Medicine

## 2017-07-15 ENCOUNTER — Ambulatory Visit (INDEPENDENT_AMBULATORY_CARE_PROVIDER_SITE_OTHER): Payer: Self-pay

## 2017-07-15 ENCOUNTER — Other Ambulatory Visit (INDEPENDENT_AMBULATORY_CARE_PROVIDER_SITE_OTHER): Payer: Self-pay | Admitting: Specialist

## 2017-07-15 ENCOUNTER — Ambulatory Visit (INDEPENDENT_AMBULATORY_CARE_PROVIDER_SITE_OTHER): Payer: Medicare Other | Admitting: Family Medicine

## 2017-07-15 ENCOUNTER — Ambulatory Visit (INDEPENDENT_AMBULATORY_CARE_PROVIDER_SITE_OTHER): Payer: Medicare Other | Admitting: Physical Medicine and Rehabilitation

## 2017-07-15 ENCOUNTER — Ambulatory Visit (INDEPENDENT_AMBULATORY_CARE_PROVIDER_SITE_OTHER): Payer: Medicare Other

## 2017-07-15 VITALS — BP 167/76 | HR 80 | Temp 98.0°F | Resp 20 | Ht 64.0 in | Wt 198.0 lb

## 2017-07-15 VITALS — BP 155/80

## 2017-07-15 DIAGNOSIS — M316 Other giant cell arteritis: Secondary | ICD-10-CM | POA: Diagnosis not present

## 2017-07-15 DIAGNOSIS — F418 Other specified anxiety disorders: Secondary | ICD-10-CM | POA: Diagnosis not present

## 2017-07-15 DIAGNOSIS — J4540 Moderate persistent asthma, uncomplicated: Secondary | ICD-10-CM

## 2017-07-15 DIAGNOSIS — Z23 Encounter for immunization: Secondary | ICD-10-CM

## 2017-07-15 DIAGNOSIS — I10 Essential (primary) hypertension: Secondary | ICD-10-CM

## 2017-07-15 DIAGNOSIS — E782 Mixed hyperlipidemia: Secondary | ICD-10-CM | POA: Diagnosis not present

## 2017-07-15 DIAGNOSIS — M353 Polymyalgia rheumatica: Secondary | ICD-10-CM | POA: Diagnosis not present

## 2017-07-15 DIAGNOSIS — Z0000 Encounter for general adult medical examination without abnormal findings: Secondary | ICD-10-CM

## 2017-07-15 DIAGNOSIS — M5416 Radiculopathy, lumbar region: Secondary | ICD-10-CM | POA: Diagnosis not present

## 2017-07-15 MED ORDER — METHYLPREDNISOLONE ACETATE 80 MG/ML IJ SUSP: 80.0000 mg | Freq: Once | INTRAMUSCULAR | Status: DC

## 2017-07-15 MED ORDER — ZOSTER VAC RECOMB ADJUVANTED 50 MCG/0.5ML IM SUSR: 0.5000 mL | Freq: Once | INTRAMUSCULAR | 1 refills | Status: AC

## 2017-07-15 MED ORDER — DEXAMETHASONE SODIUM PHOSPHATE 10 MG/ML IJ SOLN
15.0000 mg | Freq: Once | INTRAMUSCULAR | Status: AC
Start: 2017-07-15 — End: 2017-07-15
  Administered 2017-07-15: 15 mg

## 2017-07-15 MED ORDER — OLMESARTAN MEDOXOMIL 40 MG PO TABS: 40.0000 mg | ORAL_TABLET | Freq: Every day | ORAL | 1 refills | Status: DC

## 2017-07-15 NOTE — Telephone Encounter (Signed)
ultracet refill request 

## 2017-07-15 NOTE — Progress Notes (Signed)
 .  Numeric Pain Rating Scale and Functional Assessment Average Pain 7   In the last MONTH (on 0-10 scale) has pain interfered with the following?  1. General activity like being  able to carry out your everyday physical activities such as walking, climbing stairs, carrying groceries, or moving a chair?  Rating(6)   +Driver, -BT, -Dye Allergies.  

## 2017-07-15 NOTE — Progress Notes (Signed)
[name] , 09-02-1929, 84 y.o., female MRN: [id number] Patient Care Team    Relationship Specialty Notifications Start End  Ma Hillock, DO PCP - General Family Medicine  03/16/15   Hermelinda Medicus, MD  Internal Medicine  07/11/15    Comment: Rheumotology  Fay Records, MD Consulting Physician Cardiology  07/11/15   Lafayette Dragon, MD (Inactive)  Gastroenterology  07/11/15   Tanda Rockers, MD Consulting Physician Pulmonary Disease  07/11/15   Jessy Oto, MD Consulting Physician Orthopedic Surgery  07/12/16   Magnus Sinning, MD Consulting Physician Physical Medicine and Rehabilitation  07/12/16   [facility]    07/15/17   [place]  Dentistry  07/15/17     Chief Complaint  Patient presents with  . Depression  . Hypertension    Subjective:  Hypertension/iron deficiency/hyperlipidemia: Pt reports compliance with Benicar 20 mg QD. Blood pressures ranges at home not routinely checked. Patient denies chest pain, shortness of breath, dizziness or lower extremity edema. Pt taking daily baby ASA. Pt is intolerant of statin. BMP:05/20/2017 within normal limits CBC:05/20/2017 hemoglobin 15.8, hematocrit 46.9. Diet: Low-sodium Exercise: Not routinely RF: Hypertension, hyperlipidemia, obesity, Fhx HD  Depression with anxiety: Patient has been doing well on Prozac.  He is in a much better situation than when she was first increased on her Prozac. She is agreeable to try decreased dose today. She continues to take the Valium 2.5 mg occasionally.  She feels she is finally getting over or at least coping better with the stress surrounding her husband. Her family is very supportive for her, and her daughter lives next to her.  Asthma: Patient reports compliance Symbicort 2 puffs twice a day. She really needs her albuterol. She feeling much better has more energy breathing better.    Depression screen [facility] 2/9 07/15/2017 07/15/2017 04/01/2017 01/28/2017 07/12/2016  Decreased Interest 0  0 0 0 0  Down, Depressed, Hopeless 0 0 0 0 0  PHQ - 2 Score 0 0 0 0 0  Altered sleeping 0 0 0 1 -  Tired, decreased energy 0 1 0 1 -  Change in appetite 0 0 0 0 -  Feeling bad or failure about yourself  0 0 0 0 -  Trouble concentrating 0 0 0 0 -  Moving slowly or fidgety/restless 0 0 0 - -  Suicidal thoughts 0 0 0 0 -  PHQ-9 Score 0 1 0 2 -     Allergies  Allergen Reactions  . Atorvastatin Other (See Comments)    Severe headache  . Statins   . Advair Diskus [Fluticasone-Salmeterol] Other (See Comments)    Mouth sores  . Sulfonamide Derivatives Rash   Social History   Tobacco Use  . Smoking status: Never Smoker  . Smokeless tobacco: Never Used  Substance Use Topics  . Alcohol use: No   Past Medical History:  Diagnosis Date  . Abdominal bloating   . Abnormal LFTs (liver function tests)   . Anxiety disorder   . Arthritis   . Asthma   . Depression   . Diverticulosis   . Edema   . Fatty liver   . Fibromyalgia   . GERD (gastroesophageal reflux disease)   . Giant cell arteritis (HCC)    No biopsy secondary to steroid use  . Hx of adenomatous colonic polyps   . Hyperlipidemia   . Hypertension   . IBS (irritable bowel syndrome)   . Lumbar radiculitis   . Peptic ulcer disease   .  Skin cancer    shoulder, leg, right eye (? squamous by resection)  . Thoracic radiculitis   . Thyroid nodule    "removed"  . Urinary incontinence   . Vertigo    Past Surgical History:  Procedure Laterality Date  . ABDOMINAL HYSTERECTOMY    . BACK SURGERY  2005   lower back  . EYE SURGERY Right 2006  . LIVER BIOPSY     fatty liver  . NASAL SEPTUM SURGERY    . right knee surgery  2006   arthroscopy  . right leg skin cancer surgery  2014  . right shoulder skin cancer excision  2012  . THYROIDECTOMY     Family History  Problem Relation Age of Onset  . Colon polyps Maternal Aunt   . Diabetes Father   . Heart disease Father   . Heart disease Mother   . Lung disease Mother   .  COPD Mother   . Lung cancer Brother   . Cancer Brother        lung  . COPD Brother   . Stroke Brother   . Lung cancer Sister   . COPD Sister   . Kidney disease Unknown        niece-mat side  . Arthritis Unknown   . Diabetes Son   . Arthritis Daughter   . Colon cancer Neg Hx    Allergies as of 07/15/2017      Reactions   Atorvastatin Other (See Comments)   Severe headache   Statins    Advair Diskus [fluticasone-salmeterol] Other (See Comments)   Mouth sores   Sulfonamide Derivatives Rash      Medication List        Accurate as of 07/15/17 11:59 PM. Always use your most recent med list.          albuterol 108 (90 Base) MCG/ACT inhaler Commonly known as:  PROAIR HFA 2 puffs up to every 4 hours if can't catch your breath   alendronate 70 MG tablet Commonly known as:  FOSAMAX Take 70 mg by mouth once a week. Take with a full glass of water on an empty stomach.   aspirin 81 MG tablet Take 81 mg by mouth daily.   diazepam 5 MG tablet Commonly known as:  VALIUM Take 0.5 tablets (2.5 mg total) by mouth every 12 (twelve) hours as needed for anxiety. Take 1/2 tablet by mouth twice daily as needed for back spasms   ferrous sulfate 325 (65 FE) MG tablet Take 1 tablet (325 mg total) by mouth 3 (three) times daily with meals.   FLUoxetine 20 MG capsule Commonly known as:  PROZAC Take 3 capsules (60 mg total) by mouth daily.   lidocaine 5 % Commonly known as:  LIDODERM Place 1 patch daily onto the skin. Remove & Discard patch within 12 hours or as directed by MD   olmesartan 40 MG tablet Commonly known as:  BENICAR Take 1 tablet (40 mg total) by mouth daily.   predniSONE 5 MG tablet Commonly known as:  DELTASONE Take 7.5 mg daily by mouth.   SYMBICORT 80-4.5 MCG/ACT inhaler Generic drug:  budesonide-formoterol USE 2 INHALATIONS TWICE A DAY   tocilizumab 4 mg/kg in sodium chloride 0.9 % Inject 280 mg into the vein.   traMADol-acetaminophen 37.5-325 MG  tablet Commonly known as:  ULTRACET TAKE ONE TO TWO TABLETS BY MOUTH EVERY 6HOURS AS NEEDED   Zoster Vaccine Adjuvanted injection Commonly known as:  SHINGRIX Inject 0.5 mLs into  the muscle once for 1 dose.       No results found for this or any previous visit (from the past 24 hour(s)). No results found.   ROS: Negative, with the exception of above mentioned in HPI   Objective:  BP (!) 167/76 (BP Location: Right Arm, Patient Position: Sitting, Cuff Size: Large)   Pulse 80   Temp 98 F (36.7 C)   Resp 20   Ht 5\' 4"  (1.626 m)   Wt 198 lb (89.8 kg)   SpO2 93%   BMI 33.99 kg/m  Body mass index is 33.99 kg/m.  Gen: Afebrile. No acute distress.  Nontoxic in appearance, well-developed, well-nourished, obese Caucasian female. HENT: AT. [place].  MMM.  Eyes:Pupils Equal Round Reactive to light, Extraocular movements intact,  Conjunctiva without redness, discharge or icterus. CV: RRR no murmur, no edema, +2/4 P posterior tibialis pulses Chest: CTAB, no wheeze or crackles Abd: Soft.NTND. BS present Neuro:  Normal gait, walking with minimal assistance PERLA. EOMi. Alert. Oriented x3  Psych: Normal affect, dress and demeanor. Normal speech. Normal thought content and judgment.  Assessment/Plan: Weslyn Holsonback is a 84 y.o. female present for acute OV for  Essential hypertension, benign/hyperlipidemia/morbid obesity - tocilizumab infusions may be causing the increase in BP. Increase benicar again to 40 mg a day. Refills provided for 6 months today. - Low-sodium diet - Attempt to stay as active as possible - Continue baby aspirin - Intolerant to statins - Follow-up 6 months  Moderate persistent asthma without complication Stable.  Doing rather well on Symbicort 2 puffs twice a day.  Continue current regimen. Refills recently provided.  Giant cell arteritis (HCC) Polymyalgia rheumatica ([place]) Table.  Continue to follow with rheumatology. Recently changed to tocilizumab injections and  DC'd methotrexate. chronic steroids continued.  Depression with anxiety Doing well on Prozac. She is on high-dose, discussed decreasing dose with her to 60 mg a day and they are agreeable.  - refills for prozac 60 mg, 30d to local pharmacy and 3 months script with [facility].  - f/u 6 months as long as doing ok on lower dose.  If doing well on follow-up, consider decreasing again.   electronically signed by:  Howard Pouch, DO  [place]

## 2017-07-15 NOTE — Patient Instructions (Signed)

## 2017-07-15 NOTE — Patient Instructions (Addendum)
Benicar 40 mg a total a day.   prozac decrease to 2 pills (40 mg a day) instead of 60 mg a day. If you notice you are sad or anxious you can return to the 60 mg a day dose.    Follow up in 4 weeks for BP recheck. Nurse visit.   Goal BP 130 or lower on the top. Continue to monitor.

## 2017-07-15 NOTE — Patient Instructions (Addendum)
Shingles vaccine at pharmacy.   Schedule bone scan.   Bring a copy of your living will and/or healthcare power of attorney to your next office visit.  Continue doing brain stimulating activities (puzzles, reading, adult coloring books, staying active) to keep memory sharp.    Health Maintenance, Female Adopting a healthy lifestyle and getting preventive care can go a long way to promote health and wellness. Talk with your health care provider about what schedule of regular examinations is right for you. This is a good chance for you to check in with your provider about disease prevention and staying healthy. In between checkups, there are plenty of things you can do on your own. Experts have done a lot of research about which lifestyle changes and preventive measures are most likely to keep you healthy. Ask your health care provider for more information. Weight and diet Eat a healthy diet  Be sure to include plenty of vegetables, fruits, low-fat dairy products, and lean protein.  Do not eat a lot of foods high in solid fats, added sugars, or salt.  Get regular exercise. This is one of the most important things you can do for your health. ? Most adults should exercise for at least 150 minutes each week. The exercise should increase your heart rate and make you sweat (moderate-intensity exercise). ? Most adults should also do strengthening exercises at least twice a week. This is in addition to the moderate-intensity exercise.  Maintain a healthy weight  Body mass index (BMI) is a measurement that can be used to identify possible weight problems. It estimates body fat based on height and weight. Your health care provider can help determine your BMI and help you achieve or maintain a healthy weight.  For females 59 years of age and older: ? A BMI below 18.5 is considered underweight. ? A BMI of 18.5 to 24.9 is normal. ? A BMI of 25 to 29.9 is considered overweight. ? A BMI of 30 and above  is considered obese.  Watch levels of cholesterol and blood lipids  You should start having your blood tested for lipids and cholesterol at 82 years of age, then have this test every 5 years.  You may need to have your cholesterol levels checked more often if: ? Your lipid or cholesterol levels are high. ? You are older than 82 years of age. ? You are at high risk for heart disease.  Cancer screening Lung Cancer  Lung cancer screening is recommended for adults 26-44 years old who are at high risk for lung cancer because of a history of smoking.  A yearly low-dose CT scan of the lungs is recommended for people who: ? Currently smoke. ? Have quit within the past 15 years. ? Have at least a 30-pack-year history of smoking. A pack year is smoking an average of one pack of cigarettes a day for 1 year.  Yearly screening should continue until it has been 15 years since you quit.  Yearly screening should stop if you develop a health problem that would prevent you from having lung cancer treatment.  Breast Cancer  Practice breast self-awareness. This means understanding how your breasts normally appear and feel.  It also means doing regular breast self-exams. Let your health care provider know about any changes, no matter how small.  If you are in your 20s or 30s, you should have a clinical breast exam (CBE) by a health care provider every 1-3 years as part of a regular health  exam.  If you are 40 or older, have a CBE every year. Also consider having a breast X-ray (mammogram) every year.  If you have a family history of breast cancer, talk to your health care provider about genetic screening.  If you are at high risk for breast cancer, talk to your health care provider about having an MRI and a mammogram every year.  Breast cancer gene (BRCA) assessment is recommended for women who have family members with BRCA-related cancers. BRCA-related cancers  include: ? Breast. ? Ovarian. ? Tubal. ? Peritoneal cancers.  Results of the assessment will determine the need for genetic counseling and BRCA1 and BRCA2 testing.  Cervical Cancer Your health care provider may recommend that you be screened regularly for cancer of the pelvic organs (ovaries, uterus, and vagina). This screening involves a pelvic examination, including checking for microscopic changes to the surface of your cervix (Pap test). You may be encouraged to have this screening done every 3 years, beginning at age 55.  For women ages 59-65, health care providers may recommend pelvic exams and Pap testing every 3 years, or they may recommend the Pap and pelvic exam, combined with testing for human papilloma virus (HPV), every 5 years. Some types of HPV increase your risk of cervical cancer. Testing for HPV may also be done on women of any age with unclear Pap test results.  Other health care providers may not recommend any screening for nonpregnant women who are considered low risk for pelvic cancer and who do not have symptoms. Ask your health care provider if a screening pelvic exam is right for you.  If you have had past treatment for cervical cancer or a condition that could lead to cancer, you need Pap tests and screening for cancer for at least 20 years after your treatment. If Pap tests have been discontinued, your risk factors (such as having a new sexual partner) need to be reassessed to determine if screening should resume. Some women have medical problems that increase the chance of getting cervical cancer. In these cases, your health care provider may recommend more frequent screening and Pap tests.  Colorectal Cancer  This type of cancer can be detected and often prevented.  Routine colorectal cancer screening usually begins at 82 years of age and continues through 82 years of age.  Your health care provider may recommend screening at an earlier age if you have risk factors  for colon cancer.  Your health care provider may also recommend using home test kits to check for hidden blood in the stool.  A small camera at the end of a tube can be used to examine your colon directly (sigmoidoscopy or colonoscopy). This is done to check for the earliest forms of colorectal cancer.  Routine screening usually begins at age 75.  Direct examination of the colon should be repeated every 5-10 years through 82 years of age. However, you may need to be screened more often if early forms of precancerous polyps or small growths are found.  Skin Cancer  Check your skin from head to toe regularly.  Tell your health care provider about any new moles or changes in moles, especially if there is a change in a mole's shape or color.  Also tell your health care provider if you have a mole that is larger than the size of a pencil eraser.  Always use sunscreen. Apply sunscreen liberally and repeatedly throughout the day.  Protect yourself by wearing long sleeves, pants, a wide-brimmed  hat, and sunglasses whenever you are outside.  Heart disease, diabetes, and high blood pressure  High blood pressure causes heart disease and increases the risk of stroke. High blood pressure is more likely to develop in: ? People who have blood pressure in the high end of the normal range (130-139/85-89 mm Hg). ? People who are overweight or obese. ? People who are African American.  If you are 77-97 years of age, have your blood pressure checked every 3-5 years. If you are 30 years of age or older, have your blood pressure checked every year. You should have your blood pressure measured twice-once when you are at a hospital or clinic, and once when you are not at a hospital or clinic. Record the average of the two measurements. To check your blood pressure when you are not at a hospital or clinic, you can use: ? An automated blood pressure machine at a pharmacy. ? A home blood pressure monitor.  If  you are between 7 years and 48 years old, ask your health care provider if you should take aspirin to prevent strokes.  Have regular diabetes screenings. This involves taking a blood sample to check your fasting blood sugar level. ? If you are at a normal weight and have a low risk for diabetes, have this test once every three years after 82 years of age. ? If you are overweight and have a high risk for diabetes, consider being tested at a younger age or more often. Preventing infection Hepatitis B  If you have a higher risk for hepatitis B, you should be screened for this virus. You are considered at high risk for hepatitis B if: ? You were born in a country where hepatitis B is common. Ask your health care provider which countries are considered high risk. ? Your parents were born in a high-risk country, and you have not been immunized against hepatitis B (hepatitis B vaccine). ? You have HIV or AIDS. ? You use needles to inject street drugs. ? You live with someone who has hepatitis B. ? You have had sex with someone who has hepatitis B. ? You get hemodialysis treatment. ? You take certain medicines for conditions, including cancer, organ transplantation, and autoimmune conditions.  Hepatitis C  Blood testing is recommended for: ? Everyone born from [birth year] through 1963. ? Anyone with known risk factors for hepatitis C.  Sexually transmitted infections (STIs)  You should be screened for sexually transmitted infections (STIs) including gonorrhea and chlamydia if: ? You are sexually active and are younger than 82 years of age. ? You are older than 82 years of age and your health care provider tells you that you are at risk for this type of infection. ? Your sexual activity has changed since you were last screened and you are at an increased risk for chlamydia or gonorrhea. Ask your health care provider if you are at risk.  If you do not have HIV, but are at risk, it may be recommended  that you take a prescription medicine daily to prevent HIV infection. This is called pre-exposure prophylaxis (PrEP). You are considered at risk if: ? You are sexually active and do not regularly use condoms or know the HIV status of your partner(s). ? You take drugs by injection. ? You are sexually active with a partner who has HIV.  Talk with your health care provider about whether you are at high risk of being infected with HIV. If you choose to begin  PrEP, you should first be tested for HIV. You should then be tested every 3 months for as long as you are taking PrEP. Pregnancy  If you are premenopausal and you may become pregnant, ask your health care provider about preconception counseling.  If you may become pregnant, take 400 to 800 micrograms (mcg) of folic acid every day.  If you want to prevent pregnancy, talk to your health care provider about birth control (contraception). Osteoporosis and menopause  Osteoporosis is a disease in which the bones lose minerals and strength with aging. This can result in serious bone fractures. Your risk for osteoporosis can be identified using a bone density scan.  If you are 22 years of age or older, or if you are at risk for osteoporosis and fractures, ask your health care provider if you should be screened.  Ask your health care provider whether you should take a calcium or vitamin D supplement to lower your risk for osteoporosis.  Menopause may have certain physical symptoms and risks.  Hormone replacement therapy may reduce some of these symptoms and risks. Talk to your health care provider about whether hormone replacement therapy is right for you. Follow these instructions at home:  Schedule regular health, dental, and eye exams.  Stay current with your immunizations.  Do not use any tobacco products including cigarettes, chewing tobacco, or electronic cigarettes.  If you are pregnant, do not drink alcohol.  If you are  breastfeeding, limit how much and how often you drink alcohol.  Limit alcohol intake to no more than 1 drink per day for nonpregnant women. One drink equals 12 ounces of beer, 5 ounces of wine, or 1 ounces of hard liquor.  Do not use street drugs.  Do not share needles.  Ask your health care provider for help if you need support or information about quitting drugs.  Tell your health care provider if you often feel depressed.  Tell your health care provider if you have ever been abused or do not feel safe at home. This information is not intended to replace advice given to you by your health care provider. Make sure you discuss any questions you have with your health care provider. Document Released: 09/17/2010 Document Revised: 08/10/2015 Document Reviewed: 12/06/2014 Elsevier Interactive Patient Education  [employer].

## 2017-07-16 NOTE — Progress Notes (Signed)
[name] - 84 y.o. female MRN [id number]  Date of birth: [birth year]  Office Visit Note: Visit Date: 07/15/2017 PCP: Ma Hillock, DO Referred by: Ma Hillock, DO  Subjective: Chief Complaint  Patient presents with  . Right Hip - Pain   HPI: [name] is an 84 year old female that I have known over the last many years through Dr. Louanne Skye.  We last saw her in the fall of last year and completed right L3 transforaminal injections with decent short-term relief of her symptoms.  She has this interesting intermittent sharp nerve type pain that radiates into the anterior part of the lower abdomen and upper thigh.  This is more of an L1 or L2 distribution.  She does have MRI findings above her fusion which is at the L2-3 level above the L3 to the sacrum fusion.  Symptoms are consistently right-sided.  Dr. Louanne Skye suggested right L2-3 interlaminar injection.  According to the report of the MRI she has had laminectomies at L2-3.  I reviewed the imaging myself and it was very hard to see if there was intact ligamentum flavum.  Given this I did feel like it would be better for a transforaminal approach and should get the same level of medication at that level.  This would also treat the lateral recess as well as the foramen.  Patient does carry a diagnosis of fibromyalgia which I think cannot be neglected in the pain complaints that she is having.   ROS Otherwise per HPI.  Assessment & Plan: Visit Diagnoses:  1. Lumbar radiculopathy     Plan: No additional findings.   Meds & Orders:  Meds ordered this encounter  Medications  . DISCONTD: methylPREDNISolone acetate (DEPO-MEDROL) injection 80 mg  . dexamethasone (DECADRON) injection 15 mg    Orders Placed This Encounter  Procedures  . XR C-ARM NO REPORT  . Epidural Steroid injection    Follow-up: Return if symptoms worsen or fail to improve.   Procedures: No procedures performed  Lumbosacral Transforaminal Epidural Steroid Injection -  Sub-Pedicular Approach with Fluoroscopic Guidance  Patient: [name]      Date of Birth: 03-06-1930 MRN: [id number] PCP: Ma Hillock, DO      Visit Date: 07/15/2017   Universal Protocol:    Date/Time: 07/15/2017  Consent Given By: the patient  Position: PRONE  Additional Comments: Vital signs were monitored before and after the procedure. Patient was prepped and draped in the usual sterile fashion. The correct patient, procedure, and site was verified.   Injection Procedure Details:  Procedure Site One Meds Administered:  Meds ordered this encounter  Medications  . DISCONTD: methylPREDNISolone acetate (DEPO-MEDROL) injection 80 mg  . dexamethasone (DECADRON) injection 15 mg    Laterality: Right  Location/Site:  L2-L3  Needle size: 22 G  Needle type: Spinal  Needle Placement: Transforaminal  Findings:    -Comments: Excellent flow of contrast along the nerve and into the epidural space.  Procedure Details: After squaring off the end-plates to get a true AP view, the C-arm was positioned so that an oblique view of the foramen as noted above was visualized. The target area is just inferior to the "nose of the scotty dog" or sub pedicular. The soft tissues overlying this structure were infiltrated with 2-3 ml. of 1% Lidocaine without Epinephrine.  The spinal needle was inserted toward the target using a "trajectory" view along the fluoroscope beam.  Under AP and lateral visualization, the needle was advanced so it did not puncture  dura and was located close the 6 O'Clock position of the pedical in AP tracterory. Biplanar projections were used to confirm position. Aspiration was confirmed to be negative for CSF and/or blood. A 1-2 ml. volume of Isovue-250 was injected and flow of contrast was noted at each level. Radiographs were obtained for documentation purposes.   After attaining the desired flow of contrast documented above, a 0.5 to 1.0 ml test dose of 0.25%  Marcaine was injected into each respective transforaminal space.  The patient was observed for 90 seconds post injection.  After no sensory deficits were reported, and normal lower extremity motor function was noted,   the above injectate was administered so that equal amounts of the injectate were placed at each foramen (level) into the transforaminal epidural space.   Additional Comments:  The patient tolerated the procedure well Dressing: Band-Aid    Post-procedure details: Patient was observed during the procedure. Post-procedure instructions were reviewed.  Patient left the clinic in stable condition.    Clinical History: MRI LUMBAR SPINE WITHOUT AND WITH CONTRAST  TECHNIQUE: Multiplanar and multiecho pulse sequences of the lumbar spine were obtained without and with intravenous contrast.  CONTRAST:  59mL MULTIHANCE GADOBENATE DIMEGLUMINE 529 MG/ML IV SOLN  COMPARISON:  11/25/2008  FINDINGS: Segmentation:  Standard.  Alignment: Grade 1 retrolisthesis of L2 on L3 measures 5 mm, increased from prior. Slight left convex curvature of the lumbar spine.  Vertebrae: Prior posterior and interbody fusion from L3-S1. No fracture, osseous lesion, or significant marrow edema. New small L2 superior endplate Schmorl's node.  Conus medullaris and cauda equina: Conus extends to the L1-2 level. Conus and cauda equina appear normal.  Paraspinal and other soft tissues: Postsurgical changes in the posterior lumbar soft tissues with similar appearance of a fluid collection at the L4 and L5 laminectomy sites.  Disc levels:  T11-12 and T12-L1:  At most minimal disc bulging without stenosis.  L1-2: Disc desiccation and new mild-to-moderate disc space narrowing. New circumferential disc bulging, a broad left paracentral to left extraforaminal disc protrusion, and mild facet hypertrophy result in new mild left lateral recess stenosis and mild left neural foraminal stenosis.  No significant generalized spinal stenosis.  L2-3: Prior laminectomies. Chronic disc desiccation and moderate disc space narrowing. Increased retrolisthesis, bulging uncovered disc, a small right subarticular pseudo disc protrusion, and facet hypertrophy result in mild-to-moderate right lateral recess stenosis and moderate right and mild left neural foraminal stenosis. No spinal stenosis.  L3-4:  Prior posterior decompression and fusion.  No stenosis.  L4-5:  Prior posterior decompression and fusion.  No stenosis.  L5-S1:  Prior posterior decompression and fusion.  No stenosis.  IMPRESSION: 1. 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 2. New disc degeneration at L1-2 with mild left lateral recess and left neural foraminal stenosis. 3. Remote L3-S1 fusion without stenosis.   Electronically Signed   By: Logan Bores M.D.   On: 02/28/2017 13:28   She reports that she has never smoked. She has never used smokeless tobacco. No results for input(s): HGBA1C, LABURIC in the last 8760 hours.  Objective:  VS:  HT:    WT:   BMI:     BP:(!) 155/80  HR: bpm  TEMP: ( )  RESP:  Physical Exam  Ortho Exam Imaging: Xr C-arm No Report  Result Date: 07/15/2017 Please see Notes or Procedures tab for imaging impression.   Past Medical/Family/Surgical/Social History: Medications & Allergies reviewed per  EMR, new medications updated. Patient Active Problem List   Diagnosis Date Noted  . Elevated hemoglobin ([place]) 04/04/2017  . Chronic back pain 01/28/2017  . Iron deficiency 01/28/2017  . Morbid obesity ([place]) 01/28/2017  . Peptic ulcer disease   . Lumbar radiculitis   . Frequent falls 08/02/2016  . Hypertensive retinopathy of both eyes 09/07/2015  . Medicare annual wellness visit, subsequent 07/11/2015  . Long-term use of high-risk medication 07/11/2015  . Osteopenia 04/11/2015   . Depression with anxiety 04/11/2015  . Giant cell arteritis ([place]) 03/28/2015  . Polymyalgia rheumatica ([place]) 03/16/2015  . Chronic narcotic use 03/16/2015  . Anemia, iron deficiency 07/14/2013  . Asthma, moderate persistent 10/15/2012  . Hyperlipidemia 12/27/2008  . HYPERTENSION, BENIGN 12/27/2008   Past Medical History:  Diagnosis Date  . Abdominal bloating   . Abnormal LFTs (liver function tests)   . Anxiety disorder   . Arthritis   . Asthma   . Depression   . Diverticulosis   . Edema   . Fatty liver   . Fibromyalgia   . GERD (gastroesophageal reflux disease)   . Giant cell arteritis ([facility])    No biopsy secondary to steroid use  . Hx of adenomatous colonic polyps   . Hyperlipidemia   . Hypertension   . IBS (irritable bowel syndrome)   . Lumbar radiculitis   . Peptic ulcer disease   . Skin cancer    shoulder, leg, right eye (? squamous by resection)  . Thoracic radiculitis   . Thyroid nodule    "removed"  . Urinary incontinence   . Vertigo    Family History  Problem Relation Age of Onset  . Colon polyps Maternal Aunt   . Diabetes Father   . Heart disease Father   . Heart disease Mother   . Lung disease Mother   . COPD Mother   . Lung cancer Brother   . Cancer Brother        lung  . COPD Brother   . Stroke Brother   . Lung cancer Sister   . COPD Sister   . Kidney disease Unknown        niece-mat side  . Arthritis Unknown   . Diabetes Son   . Arthritis Daughter   . Colon cancer Neg Hx    Past Surgical History:  Procedure Laterality Date  . ABDOMINAL HYSTERECTOMY    . BACK SURGERY  2005   lower back  . EYE SURGERY Right 2006  . LIVER BIOPSY     fatty liver  . NASAL SEPTUM SURGERY    . right knee surgery  2006   arthroscopy  . right leg skin cancer surgery  2014  . right shoulder skin cancer excision  2012  . THYROIDECTOMY     Social History   Occupational History  . Occupation: Retired Water engineer  Tobacco Use  . Smoking  status: Never Smoker  . Smokeless tobacco: Never Used  Substance and Sexual Activity  . Alcohol use: No  . Drug use: No  . Sexual activity: Never

## 2017-07-16 NOTE — Telephone Encounter (Signed)
Called to pharm 

## 2017-07-16 NOTE — Procedures (Signed)
Lumbosacral Transforaminal Epidural Steroid Injection - Sub-Pedicular Approach with Fluoroscopic Guidance  Patient: [name]      Date of Birth: 09/08/1929 MRN: [id number] PCP: Ma Hillock, DO      Visit Date: 07/15/2017   Universal Protocol:    Date/Time: 07/15/2017  Consent Given By: the patient  Position: PRONE  Additional Comments: Vital signs were monitored before and after the procedure. Patient was prepped and draped in the usual sterile fashion. The correct patient, procedure, and site was verified.   Injection Procedure Details:  Procedure Site One Meds Administered:  Meds ordered this encounter  Medications  . DISCONTD: methylPREDNISolone acetate (DEPO-MEDROL) injection 80 mg  . dexamethasone (DECADRON) injection 15 mg    Laterality: Right  Location/Site:  L2-L3  Needle size: 22 G  Needle type: Spinal  Needle Placement: Transforaminal  Findings:    -Comments: Excellent flow of contrast along the nerve and into the epidural space.  Procedure Details: After squaring off the end-plates to get a true AP view, the C-arm was positioned so that an oblique view of the foramen as noted above was visualized. The target area is just inferior to the "nose of the scotty dog" or sub pedicular. The soft tissues overlying this structure were infiltrated with 2-3 ml. of 1% Lidocaine without Epinephrine.  The spinal needle was inserted toward the target using a "trajectory" view along the fluoroscope beam.  Under AP and lateral visualization, the needle was advanced so it did not puncture dura and was located close the 6 O'Clock position of the pedical in AP tracterory. Biplanar projections were used to confirm position. Aspiration was confirmed to be negative for CSF and/or blood. A 1-2 ml. volume of Isovue-250 was injected and flow of contrast was noted at each level. Radiographs were obtained for documentation purposes.   After attaining the desired flow of  contrast documented above, a 0.5 to 1.0 ml test dose of 0.25% Marcaine was injected into each respective transforaminal space.  The patient was observed for 90 seconds post injection.  After no sensory deficits were reported, and normal lower extremity motor function was noted,   the above injectate was administered so that equal amounts of the injectate were placed at each foramen (level) into the transforaminal epidural space.   Additional Comments:  The patient tolerated the procedure well Dressing: Band-Aid    Post-procedure details: Patient was observed during the procedure. Post-procedure instructions were reviewed.  Patient left the clinic in stable condition.

## 2017-07-21 ENCOUNTER — Encounter: Payer: Self-pay | Admitting: Family Medicine

## 2017-07-27 ENCOUNTER — Other Ambulatory Visit: Payer: Self-pay | Admitting: Family Medicine

## 2017-07-27 DIAGNOSIS — I10 Essential (primary) hypertension: Secondary | ICD-10-CM

## 2017-07-31 ENCOUNTER — Encounter (INDEPENDENT_AMBULATORY_CARE_PROVIDER_SITE_OTHER): Payer: Self-pay | Admitting: Specialist

## 2017-07-31 ENCOUNTER — Ambulatory Visit (INDEPENDENT_AMBULATORY_CARE_PROVIDER_SITE_OTHER): Payer: Medicare Other | Admitting: Specialist

## 2017-07-31 VITALS — BP 148/72 | HR 77 | Ht 64.0 in | Wt 198.0 lb

## 2017-07-31 DIAGNOSIS — M48062 Spinal stenosis, lumbar region with neurogenic claudication: Secondary | ICD-10-CM

## 2017-07-31 MED ORDER — TRAMADOL HCL 50 MG PO TABS: 50.0000 mg | ORAL_TABLET | Freq: Four times a day (QID) | ORAL | 0 refills | Status: DC | PRN

## 2017-07-31 NOTE — Progress Notes (Signed)
Office Visit Note   Patient: [name]           Date of Birth: 1930-02-17           MRN: [id number] Visit Date: 07/31/2017              Requested by: Ma Hillock, DO [address], [place] [address] PCP: Ma Hillock, DO   Assessment & Plan: Visit Diagnoses:  1. Spinal stenosis of lumbar region with neurogenic claudication     Plan:Avoid bending, stooping and avoid lifting weights greater than 10 lbs. Avoid prolong standing and walking. Avoid frequent bending and stooping  No lifting greater than 10 lbs. May use ice or moist heat for pain. Weight loss is of benefit. Handicap license is approved.  Follow-Up Instructions: Return in about 6 weeks (around 09/11/2017).   Orders:  No orders of the defined types were placed in this encounter.  No orders of the defined types were placed in this encounter.     Procedures: No procedures performed   Clinical Data: No additional findings.   Subjective: Chief Complaint  Patient presents with  . Lower Back - Follow-up, Pain    84 year old female status post right ESI at L2-3 for stenosis and post injection she had right inguinal and right groin and right thigh pain into the right knee. She is on antihypertensive agents. She is taking tramadol 1-2 tablets every 6 hours prn. The first 6 days were bad but now she only needs prn.    Review of Systems  Constitutional: Negative.   HENT: Positive for congestion.   Eyes: Negative.  Negative for photophobia, pain, discharge, redness, itching and visual disturbance.  Respiratory: Positive for cough, chest tightness, shortness of breath and wheezing. Negative for apnea and choking.   Cardiovascular: Negative.  Negative for chest pain, palpitations and leg swelling.  Gastrointestinal: Negative.  Negative for abdominal distention, abdominal pain, anal bleeding, blood in stool, constipation, diarrhea, nausea, rectal pain and vomiting.  Endocrine: Negative.  Negative for  cold intolerance, heat intolerance, polydipsia, polyphagia and polyuria.  Genitourinary: Negative for difficulty urinating, dyspareunia, dysuria, enuresis, flank pain, frequency, genital sores and hematuria.  Musculoskeletal: Positive for back pain, gait problem and joint swelling.  Skin: Negative.   Allergic/Immunologic: Negative.   Neurological: Positive for weakness and numbness.  Hematological: Negative.   Psychiatric/Behavioral: Negative.      Objective: Vital Signs: BP (!) 148/72 (BP Location: Left Arm, Patient Position: Sitting, Cuff Size: Normal)   Pulse 77   Ht 5\' 4"  (1.626 m)   Wt 198 lb (89.8 kg)   BMI 33.99 kg/m   Physical Exam  Constitutional: She is oriented to person, place, and time. She appears well-developed and well-nourished.  HENT:  Head: Normocephalic and atraumatic.  Eyes: Pupils are equal, round, and reactive to light. EOM are normal.  Neck: Normal range of motion. Neck supple.  Pulmonary/Chest: Effort normal and breath sounds normal.  Abdominal: Soft. Bowel sounds are normal.  Neurological: She is alert and oriented to person, place, and time.  Skin: Skin is warm and dry.  Psychiatric: She has a normal mood and affect. Her behavior is normal. Judgment and thought content normal.    Back Exam   Tenderness  The patient is experiencing tenderness in the lumbar.  Range of Motion  Extension: abnormal  Flexion: abnormal  Lateral bend right: normal  Lateral bend left: normal  Rotation right: normal  Rotation left: normal   Muscle  Strength  Right Quadriceps:  5/5  Left Quadriceps:  5/5  Right Hamstrings:  5/5   Tests  Straight leg raise right: negative Straight leg raise left: negative  Reflexes  Patellar: normal Achilles: normal Babinski's sign: normal   Other  Toe walk: normal Heel walk: normal Gait: normal  Erythema: no back redness Scars: absent      Specialty Comments:  No specialty comments available.  Imaging: No  results found.   PMFS History: Patient Active Problem List   Diagnosis Date Noted  . Elevated hemoglobin ([place]) 04/04/2017  . Chronic back pain 01/28/2017  . Iron deficiency 01/28/2017  . Morbid obesity ([place]) 01/28/2017  . Peptic ulcer disease   . Lumbar radiculitis   . Frequent falls 08/02/2016  . Hypertensive retinopathy of both eyes 09/07/2015  . Medicare annual wellness visit, subsequent 07/11/2015  . Long-term use of high-risk medication 07/11/2015  . Osteopenia 04/11/2015  . Depression with anxiety 04/11/2015  . Giant cell arteritis ([place]) 03/28/2015  . Polymyalgia rheumatica ([place]) 03/16/2015  . Chronic narcotic use 03/16/2015  . Anemia, iron deficiency 07/14/2013  . Asthma, moderate persistent 10/15/2012  . Hyperlipidemia 12/27/2008  . HYPERTENSION, BENIGN 12/27/2008   Past Medical History:  Diagnosis Date  . Abdominal bloating   . Abnormal LFTs (liver function tests)   . Anxiety disorder   . Arthritis   . Asthma   . Depression   . Diverticulosis   . Edema   . Fatty liver   . Fibromyalgia   . GERD (gastroesophageal reflux disease)   . Giant cell arteritis ([facility])    No biopsy secondary to steroid use  . Hx of adenomatous colonic polyps   . Hyperlipidemia   . Hypertension   . IBS (irritable bowel syndrome)   . Lumbar radiculitis   . Peptic ulcer disease   . Skin cancer    shoulder, leg, right eye (? squamous by resection)  . Thoracic radiculitis   . Thyroid nodule    "removed"  . Urinary incontinence   . Vertigo     Family History  Problem Relation Age of Onset  . Colon polyps Maternal Aunt   . Diabetes Father   . Heart disease Father   . Heart disease Mother   . Lung disease Mother   . COPD Mother   . Lung cancer Brother   . Cancer Brother        lung  . COPD Brother   . Stroke Brother   . Lung cancer Sister   . COPD Sister   . Kidney disease Unknown        niece-mat side  . Arthritis Unknown   . Diabetes Son   . Arthritis Daughter   .  Colon cancer Neg Hx     Past Surgical History:  Procedure Laterality Date  . ABDOMINAL HYSTERECTOMY    . BACK SURGERY  2005   lower back  . EYE SURGERY Right 2006  . LIVER BIOPSY     fatty liver  . NASAL SEPTUM SURGERY    . right knee surgery  2006   arthroscopy  . right leg skin cancer surgery  2014  . right shoulder skin cancer excision  2012  . THYROIDECTOMY     Social History   Occupational History  . Occupation: Retired Water engineer  Tobacco Use  . Smoking status: Never Smoker  . Smokeless tobacco: Never Used  Substance and Sexual Activity  . Alcohol use: No  . Drug use: No  .  Sexual activity: Never

## 2017-07-31 NOTE — Patient Instructions (Addendum)
Avoid bending, stooping and avoid lifting weights greater than 10 lbs. Avoid prolong standing and walking. Avoid frequent bending and stooping  No lifting greater than 10 lbs. May use ice or moist heat for pain. Weight loss is of benefit. Handicap license is approved.  

## 2017-08-07 DIAGNOSIS — Z79899 Other long term (current) drug therapy: Secondary | ICD-10-CM | POA: Diagnosis not present

## 2017-08-07 DIAGNOSIS — M316 Other giant cell arteritis: Secondary | ICD-10-CM | POA: Diagnosis not present

## 2017-08-13 ENCOUNTER — Ambulatory Visit (INDEPENDENT_AMBULATORY_CARE_PROVIDER_SITE_OTHER): Payer: Medicare Other

## 2017-08-13 ENCOUNTER — Telehealth: Payer: Self-pay | Admitting: Family Medicine

## 2017-08-13 VITALS — BP 136/68 | HR 72

## 2017-08-13 DIAGNOSIS — I10 Essential (primary) hypertension: Secondary | ICD-10-CM

## 2017-08-13 MED ORDER — OLMESARTAN MEDOXOMIL 20 MG PO TABS: 30.0000 mg | ORAL_TABLET | Freq: Every day | ORAL | 1 refills | Status: DC

## 2017-08-13 NOTE — Telephone Encounter (Signed)
1.5 = 30mg  benicar is fine, make sure they do not pick up new dose of 40 mg. I will change script.

## 2017-08-13 NOTE — Progress Notes (Addendum)
[name] is a 84 y.o. female presents to the office today for Blood pressure recheck secondary to elevated BP in office.  Blood pressure medication:Benicar 40mg  1.5 tabs every day. Patient states that 2 tabs a day makes her feel "lethargic, very tired and weak with no energy".  If on medication, Last dose was at least 1-2 hours prior to recheck: Yes Blood pressure was taken in the left arm after patient rested for 10 minutes.  BP 136/68 (BP Location: Left Arm, Patient Position: Sitting, Cuff Size: Large)   Pulse 72   Starla Link, CMA  Please check with patient (or [name], daughter) - the benicar was a total of 40 mg a day. She states she is taking 1.5 pills. Please ask her the dose of her pill (she was on 20 mg pill and was asked to take two). However new med had been called in at 40 mg per pill and we need to make sure she is not taking 1.5 of the 40 mg tab.  Total dose should be benicar 30 or 40 mg.      Howard Pouch, DO

## 2017-08-13 NOTE — Telephone Encounter (Signed)
Spoke with [name] patient's daughter she will check dosing on Benicar and call us back and let us know what patient has been taking. She returned call and states her mom has been taking the 20mg  tablets 1.5 tabs she states if she takes 2 she gets very weak,

## 2017-08-13 NOTE — Telephone Encounter (Signed)
Copied from [facility] [place] [id number]. Topic: Quick Communication - See Telephone Encounter >> Aug 13, 2017  4:41 PM Bea Graff, NT wrote: CRM for notification. See Telephone encounter for: 08/13/17. Pts daughter [name] returning call to Manuela Schwartz. She state she doubled checked the medicaiton and it is Benicar 20mg  and she is taking 1 and 1/2 tablets which is a total of 30mg . But if  she takes 2 pills she is very weak. CB#: [phone]

## 2017-08-14 MED ORDER — OLMESARTAN MEDOXOMIL 20 MG PO TABS: 30.0000 mg | ORAL_TABLET | Freq: Every day | ORAL | 1 refills | Status: DC

## 2017-08-14 NOTE — Telephone Encounter (Signed)
Reviewed information and instructions with patient daughter [name] she verbalized understanding.

## 2017-08-26 DIAGNOSIS — M316 Other giant cell arteritis: Secondary | ICD-10-CM | POA: Diagnosis not present

## 2017-08-26 DIAGNOSIS — Z79899 Other long term (current) drug therapy: Secondary | ICD-10-CM | POA: Diagnosis not present

## 2017-08-26 DIAGNOSIS — K219 Gastro-esophageal reflux disease without esophagitis: Secondary | ICD-10-CM | POA: Diagnosis not present

## 2017-08-26 DIAGNOSIS — R748 Abnormal levels of other serum enzymes: Secondary | ICD-10-CM | POA: Diagnosis not present

## 2017-08-26 DIAGNOSIS — N390 Urinary tract infection, site not specified: Secondary | ICD-10-CM | POA: Diagnosis not present

## 2017-09-02 ENCOUNTER — Ambulatory Visit: Payer: Self-pay | Admitting: *Deleted

## 2017-09-02 NOTE — Telephone Encounter (Signed)
Pt;s daughter called to report pt DX with UTI 2 weeks when seen by rheumatologist. States was prescribed course of amoxicillin-clavulanate which she completed last Monday. Saturday (08/30/17) daughter states pt reported burning with urination and "Pressure at bladder area." States these are the previous symptoms. Daughter reports burning resolved but pt still C/O pressure, discomfort at bladder area. Denies frequency, fever. "Just feels bad." Daughter is requesting Cipro be called in as effective in past. Pt adamantly declined call to rheumatologist. Requesting Dr. Raoul Pitch be made aware of episode. Made aware pt may need to be seen. Verbalizes understanding. If appropriate to call in cipro per request:  [facility].  Daughter's call back [phone]     Reason for Disposition . All other urine symptoms  Answer Assessment - Initial Assessment Questions 1. SYMPTOM: "What's the main symptom you're concerned about?" (e.g., frequency, incontinence)     Bladder pressure 2. ONSET: "When did the  ________  start?"    Saturday 3. PAIN: "Is there any pain?" If so, ask: "How bad is it?" (Scale: 1-10; mild, moderate, severe)     Unsure 4. CAUSE: "What do you think is causing the symptoms?"     UTI dx 1 week ago by rheumatologist; completed antibiotics, symptoms reoccurring Saturday 5. OTHER SYMPTOMS: "Do you have any other symptoms?" (e.g., fever, flank pain, blood in urine, pain with urination)     Burning Saturday, not presently  Protocols used: URINARY [facility]

## 2017-09-02 NOTE — Telephone Encounter (Signed)
Spoke with patients daughter [name] let her know patient would need an appt. Schedule patient to be seen.

## 2017-09-04 ENCOUNTER — Ambulatory Visit (INDEPENDENT_AMBULATORY_CARE_PROVIDER_SITE_OTHER): Payer: Medicare Other | Admitting: Family Medicine

## 2017-09-04 ENCOUNTER — Encounter: Payer: Self-pay | Admitting: Family Medicine

## 2017-09-04 VITALS — BP 138/81 | HR 78 | Temp 97.9°F | Resp 20 | Ht 64.0 in | Wt 196.0 lb

## 2017-09-04 DIAGNOSIS — N3946 Mixed incontinence: Secondary | ICD-10-CM

## 2017-09-04 DIAGNOSIS — R8290 Unspecified abnormal findings in urine: Secondary | ICD-10-CM | POA: Diagnosis not present

## 2017-09-04 DIAGNOSIS — N390 Urinary tract infection, site not specified: Secondary | ICD-10-CM

## 2017-09-04 DIAGNOSIS — R350 Frequency of micturition: Secondary | ICD-10-CM | POA: Diagnosis not present

## 2017-09-04 LAB — POC URINALSYSI DIPSTICK (AUTOMATED)
BILIRUBIN UA: NEGATIVE
Blood, UA: NEGATIVE
GLUCOSE UA: NEGATIVE
Ketones, UA: NEGATIVE
NITRITE UA: NEGATIVE
Protein, UA: POSITIVE — AB
Spec Grav, UA: 1.025 (ref 1.010–1.025)
UROBILINOGEN UA: 0.2 U/dL
pH, UA: 5.5 (ref 5.0–8.0)

## 2017-09-04 MED ORDER — ESTROGENS, CONJUGATED 0.625 MG/GM VA CREA: TOPICAL_CREAM | VAGINAL | 1 refills | Status: DC

## 2017-09-04 MED ORDER — CIPROFLOXACIN HCL 500 MG PO TABS: 500.0000 mg | ORAL_TABLET | Freq: Two times a day (BID) | ORAL | 0 refills | Status: DC

## 2017-09-04 NOTE — Patient Instructions (Signed)
Start antibiotic every 12 hours for 5 days.  Hydrate, drink water.  I have called in the estrogen cream to apply as we discussed.   I also will place a referral to gynecology for you to have a full pelvic exam and evaluate the bladder with urinary studies.     Pelvic Organ Prolapse Pelvic organ prolapse is the stretching, bulging, or dropping of pelvic organs into an abnormal position. It happens when the muscles and tissues that surround and support pelvic structures are stretched or weak. Pelvic organ prolapse can involve:  Vagina (vaginal prolapse).  Uterus (uterine prolapse).  Bladder (cystocele).  Rectum (rectocele).  Intestines (enterocele).  When organs other than the vagina are involved, they often bulge into the vagina or protrude from the vagina, depending on how severe the prolapse is. What are the causes? Causes of this condition include:  Pregnancy, labor, and childbirth.  Long-lasting (chronic) cough.  Chronic constipation.  Obesity.  Past pelvic surgery.  Aging. During and after menopause, a decreased production of the hormone estrogen can weaken pelvic ligaments and muscles.  Consistently lifting more than 50 lb (23 kg).  Buildup of fluid in the abdomen due to certain diseases and other conditions.  What are the signs or symptoms? Symptoms of this condition include:  Loss of bladder control when you cough, sneeze, strain, and exercise (stress incontinence). This may be worse immediately following childbirth, and it may gradually improve over time.  Feeling pressure in your pelvis or vagina. This pressure may increase when you cough or when you are having a bowel movement.  A bulge that protrudes from the opening of your vagina or against your vaginal wall. If your uterus protrudes through the opening of your vagina and rubs against your clothing, you may also experience soreness, ulcers, infection, pain, and bleeding.  Increased effort to have a bowel  movement or urinate.  Pain in your low back.  Pain, discomfort, or disinterest in sexual intercourse.  Repeated bladder infections (urinary tract infections).  Difficulty inserting or inability to insert a tampon or applicator.  In some people, this condition does not cause any symptoms. How is this diagnosed? Your health care provider may perform an internal and external vaginal and rectal exam. During the exam, you may be asked to cough and strain while you are lying down, sitting, and standing up. Your health care provider will determine if other tests are required, such as bladder function tests. How is this treated? In most cases, this condition needs to be treated only if it produces symptoms. No treatment is guaranteed to correct the prolapse or relieve the symptoms completely. Treatment may include:  Lifestyle changes, such as: ? Avoiding drinking beverages that contain caffeine. ? Increasing your intake of high-fiber foods. This can help to decrease constipation and straining during bowel movements. ? Emptying your bladder at scheduled times (bladder training therapy). This can help to reduce or avoid urinary incontinence. ? Losing weight if you are overweight or obese.  Estrogen. Estrogen may help mild prolapse by increasing the strength and tone of pelvic floor muscles.  Kegel exercises. These may help mild cases of prolapse by strengthening and tightening the muscles of the pelvic floor.  Pessary insertion. A pessary is a soft, flexible device that is placed into your vagina by your health care provider to help support the vaginal walls and keep pelvic organs in place.  Surgery. This is often the only form of treatment for severe prolapse. Different types of surgeries are  available.  Follow these instructions at home:  Wear a sanitary pad or absorbent product if you have urinary incontinence.  Avoid heavy lifting and straining with exercise and work. Do not hold your  breath when you perform mild to moderate lifting and exercise activities. Limit your activities as directed by your health care provider.  Take medicines only as directed by your health care provider.  Perform Kegel exercises as directed by your health care provider.  If you have a pessary, take care of it as directed by your health care provider. Contact a health care provider if:  Your symptoms interfere with your daily activities or sex life.  You need medicine to help with the discomfort.  You notice bleeding from the vagina that is not related to your period.  You have a fever.  You have pain or bleeding when you urinate.  You have bleeding when you have a bowel movement.  You lose urine when you have sex.  You have chronic constipation.  You have a pessary that falls out.  You have vaginal discharge that has a bad smell.  You have low abdominal pain or cramping that is unusual for you. This information is not intended to replace advice given to you by your health care provider. Make sure you discuss any questions you have with your health care provider. Document Released: 09/29/2013 Document Revised: 08/10/2015 Document Reviewed: 05/17/2013 Elsevier Interactive Patient Education  [employer].

## 2017-09-04 NOTE — Progress Notes (Signed)
[name] , Jun 14, 1929, 84 y.o., female MRN: [id number] Patient Care Team    Relationship Specialty Notifications Start End  Ma Hillock, DO PCP - General Family Medicine  03/16/15   Hermelinda Medicus, MD  Internal Medicine  07/11/15    Comment: Rheumotology  Fay Records, MD Consulting Physician Cardiology  07/11/15   Lafayette Dragon, MD (Inactive)  Gastroenterology  07/11/15   Tanda Rockers, MD Consulting Physician Pulmonary Disease  07/11/15   Jessy Oto, MD Consulting Physician Orthopedic Surgery  07/12/16   Magnus Sinning, MD Consulting Physician Physical Medicine and Rehabilitation  07/12/16   [facility]    07/15/17   [facility]  07/15/17     Chief Complaint  Patient presents with  . Urinary Frequency    burning with urination     Subjective: Pt presents for an OV with complaints of urinary frequency and dysuria. Marland Kitchen She was seen at her rheumatologist office last week with ah POCT urine + moderate leuks, moderate bacteria, no culture, treated with augmentin x7 d.  She has been finished with her antibiotics for 2 days and she feels her symptoms are starting to worsen again.  She endorses a mild improvement in symptoms when using antibiotic, but never resolved.  She has had an increase in urinary tract infection symptoms with positive culture of protease.  She has responded well to Cipro in the past.    Depression screen [facility] 2/9 07/15/2017 07/15/2017 04/01/2017 01/28/2017 07/12/2016  Decreased Interest 0 0 0 0 0  Down, Depressed, Hopeless 0 0 0 0 0  PHQ - 2 Score 0 0 0 0 0  Altered sleeping 0 0 0 1 -  Tired, decreased energy 0 1 0 1 -  Change in appetite 0 0 0 0 -  Feeling bad or failure about yourself  0 0 0 0 -  Trouble concentrating 0 0 0 0 -  Moving slowly or fidgety/restless 0 0 0 - -  Suicidal thoughts 0 0 0 0 -  PHQ-9 Score 0 1 0 2 -    Allergies  Allergen Reactions  . Atorvastatin Other (See Comments)    Severe headache  . Statins   .  Advair Diskus [Fluticasone-Salmeterol] Other (See Comments)    Mouth sores  . Sulfonamide Derivatives Rash   Social History   Tobacco Use  . Smoking status: Never Smoker  . Smokeless tobacco: Never Used  Substance Use Topics  . Alcohol use: No   Past Medical History:  Diagnosis Date  . Abdominal bloating   . Abnormal LFTs (liver function tests)   . Anxiety disorder   . Arthritis   . Asthma   . Depression   . Diverticulosis   . Edema   . Fatty liver   . Fibromyalgia   . GERD (gastroesophageal reflux disease)   . Giant cell arteritis (HCC)    No biopsy secondary to steroid use  . Hx of adenomatous colonic polyps   . Hyperlipidemia   . Hypertension   . IBS (irritable bowel syndrome)   . Lumbar radiculitis   . Peptic ulcer disease   . Skin cancer    shoulder, leg, right eye (? squamous by resection)  . Thoracic radiculitis   . Thyroid nodule    "removed"  . Urinary incontinence   . Vertigo    Past Surgical History:  Procedure Laterality Date  . ABDOMINAL HYSTERECTOMY    . BACK SURGERY  2005  lower back  . EYE SURGERY Right 2006  . LIVER BIOPSY     fatty liver  . NASAL SEPTUM SURGERY    . right knee surgery  2006   arthroscopy  . right leg skin cancer surgery  2014  . right shoulder skin cancer excision  2012  . THYROIDECTOMY     Family History  Problem Relation Age of Onset  . Colon polyps Maternal Aunt   . Diabetes Father   . Heart disease Father   . Heart disease Mother   . Lung disease Mother   . COPD Mother   . Lung cancer Brother   . Cancer Brother        lung  . COPD Brother   . Stroke Brother   . Lung cancer Sister   . COPD Sister   . Kidney disease Unknown        niece-mat side  . Arthritis Unknown   . Diabetes Son   . Arthritis Daughter   . Colon cancer Neg Hx    Allergies as of 09/04/2017      Reactions   Atorvastatin Other (See Comments)   Severe headache   Statins    Advair Diskus [fluticasone-salmeterol] Other (See Comments)     Mouth sores   Sulfonamide Derivatives Rash      Medication List        Accurate as of 09/04/17 10:49 AM. Always use your most recent med list.          albuterol 108 (90 Base) MCG/ACT inhaler Commonly known as:  PROAIR HFA 2 puffs up to every 4 hours if can't catch your breath   aspirin 81 MG tablet Take 81 mg by mouth daily.   diazepam 5 MG tablet Commonly known as:  VALIUM Take 0.5 tablets (2.5 mg total) by mouth every 12 (twelve) hours as needed for anxiety. Take 1/2 tablet by mouth twice daily as needed for back spasms   FLUoxetine 20 MG capsule Commonly known as:  PROZAC Take 3 capsules (60 mg total) by mouth daily.   lidocaine 5 % Commonly known as:  LIDODERM Place 1 patch daily onto the skin. Remove & Discard patch within 12 hours or as directed by MD   olmesartan 20 MG tablet Commonly known as:  BENICAR Take 1.5 tablets (30 mg total) by mouth daily.   omeprazole 40 MG capsule Commonly known as:  PRILOSEC Take by mouth.   predniSONE 5 MG tablet Commonly known as:  DELTASONE Take 7.5 mg daily by mouth.   SYMBICORT 80-4.5 MCG/ACT inhaler Generic drug:  budesonide-formoterol USE 2 INHALATIONS TWICE A DAY   tocilizumab 4 mg/kg in sodium chloride 0.9 % Inject 280 mg into the vein.   traMADol 50 MG tablet Commonly known as:  ULTRAM Take 1 tablet (50 mg total) by mouth every 6 (six) hours as needed for moderate pain.       All past medical history, surgical history, allergies, family history, immunizations andmedications were updated in the EMR today and reviewed under the history and medication portions of their EMR.     ROS: Negative, with the exception of above mentioned in HPI   Objective:  BP 138/81 (BP Location: Right Arm, Patient Position: Sitting, Cuff Size: Large)   Pulse 78   Temp 97.9 F (36.6 C)   Resp 20   Ht 5\' 4"  (1.626 m)   Wt 196 lb (88.9 kg)   SpO2 95%   BMI 33.64 kg/m  Body mass index  is 33.64 kg/m. Gen: Afebrile. No  acute distress. Nontoxic in appearance, well developed, well nourished.  HENT: AT. [place].  MMM Eyes:Pupils Equal Round Reactive to light, Extraocular movements intact,  Conjunctiva without redness, discharge or icterus. CV: RRR, no edema Chest: CTAB, no wheeze or crackles. Good air movement, normal resp effort.  Abd: Soft.  Obese. NT , mildly distended today. BS present.  No masses palpated. No rebound or guarding.  MSK: No CVA tenderness bilaterally Skin: No rashes, purpura or petechiae.  Neuro:  Normal gait. PERLA. EOMi. Alert. Oriented x3  Psych: Normal affect, dress and demeanor. Normal speech. Normal thought content and judgment.  No exam data present No results found. Results for orders placed or performed in visit on 09/04/17 (from the past 24 hour(s))  POCT Urinalysis Dipstick (Automated)     Status: Abnormal   Collection Time: 09/04/17 10:42 AM  Result Value Ref Range   Color, UA yellow    Clarity, UA S. cloudy    Glucose, UA Negative Negative   Bilirubin, UA negative    Ketones, UA negative    Spec Grav, UA 1.025 1.010 - 1.025   Blood, UA Negative    pH, UA 5.5 5.0 - 8.0   Protein, UA Positive (A) Negative   Urobilinogen, UA 0.2 0.2 or 1.0 E.U./dL   Nitrite, UA negative    Leukocytes, UA Small (1+) (A) Negative    Assessment/Plan: [name] is a 84 y.o. female present for OV for  Urinary frequency/abnormal urine/mixed stress and urge incontinence/frequent UTI -Patient with increased mixed incontinence and complaints of UTI symptoms with positive protease UTIs in the past responded to Cipro.  Discussed gynecological referral today for urinary studies and pelvic evaluation.  She recalls having a bladder repair, she does not believe the use of mesh, when she had her hysterectomy.  She is wondering if the prior repair has become undone.  She is agreeable to referral to gynecology for further evaluation. - POCT Urinalysis Dipstick (Automated): Small leuks, positive protein in  urine.  Will send for culture. -Cipro 500 mg twice daily x5 days.  Trial of Premarin cream application to urethral meatus area only, education today on application. - Ambulatory referral to Gynecology -Follow-up 2 weeks if not improved   Reviewed expectations re: course of current medical issues.  Discussed self-management of symptoms.  Outlined signs and symptoms indicating need for more acute intervention.  Patient verbalized understanding and all questions were answered.  Patient received an After-Visit Summary.    Orders Placed This Encounter  Procedures  . POCT Urinalysis Dipstick (Automated)     Note is dictated utilizing voice recognition software. Although note has been proof read prior to signing, occasional typographical errors still can be missed. If any questions arise, please do not hesitate to call for verification.   electronically signed by:  Howard Pouch, DO  [place]

## 2017-09-05 LAB — URINE CULTURE
MICRO NUMBER: 90742545
RESULT: NO GROWTH
SPECIMEN QUALITY:: ADEQUATE

## 2017-09-08 ENCOUNTER — Telehealth: Payer: Self-pay | Admitting: Family Medicine

## 2017-09-08 NOTE — Telephone Encounter (Signed)
Spoke with patient daughter [name] reviewed lab results and instructions. [name] verbalized understanding.

## 2017-09-08 NOTE — Telephone Encounter (Signed)
Please inform patient the following information: Her urine culture did not show signs of infection.  She can continue the cipro  if improving, and follow up with gyn referral for further evaluation as we discussed.  Continue cream.  If worsening before she can get into gyn, she should be seen here.

## 2017-09-15 DIAGNOSIS — M810 Age-related osteoporosis without current pathological fracture: Secondary | ICD-10-CM

## 2017-09-15 HISTORY — DX: Age-related osteoporosis without current pathological fracture: M81.0

## 2017-09-17 ENCOUNTER — Ambulatory Visit (INDEPENDENT_AMBULATORY_CARE_PROVIDER_SITE_OTHER): Payer: Medicare Other | Admitting: Obstetrics & Gynecology

## 2017-09-17 ENCOUNTER — Encounter: Payer: Self-pay | Admitting: Obstetrics & Gynecology

## 2017-09-17 VITALS — BP 140/80 | Temp 98.0°F | Ht 64.0 in | Wt 197.0 lb

## 2017-09-17 DIAGNOSIS — N762 Acute vulvitis: Secondary | ICD-10-CM

## 2017-09-17 DIAGNOSIS — Z01419 Encounter for gynecological examination (general) (routine) without abnormal findings: Secondary | ICD-10-CM | POA: Diagnosis not present

## 2017-09-17 DIAGNOSIS — M8589 Other specified disorders of bone density and structure, multiple sites: Secondary | ICD-10-CM | POA: Diagnosis not present

## 2017-09-17 DIAGNOSIS — R300 Dysuria: Secondary | ICD-10-CM | POA: Diagnosis not present

## 2017-09-17 DIAGNOSIS — Z13820 Encounter for screening for osteoporosis: Secondary | ICD-10-CM

## 2017-09-17 DIAGNOSIS — R102 Pelvic and perineal pain: Secondary | ICD-10-CM | POA: Diagnosis not present

## 2017-09-17 LAB — WET PREP FOR TRICH, YEAST, CLUE

## 2017-09-17 MED ORDER — TRIAMCINOLONE ACETONIDE 0.1 % EX OINT: 1.0000 "application " | TOPICAL_OINTMENT | Freq: Every day | CUTANEOUS | 1 refills | Status: DC

## 2017-09-17 NOTE — Progress Notes (Signed)
[name] Oct 02, 1929 [id number]   History:    84 y.o. G3P3L3  RP:  New patient presenting for annual gyn exam   HPI: Status post total hysterectomy.  Patient complains of mild lower abdominal and back pain.  Complains of pain with micturition.  No blood in urine.  Abstinent.  Bowel movements normal.  Breasts normal. Body mass index 33.81.  Past medical history,surgical history, family history and social history were all reviewed and documented in the EPIC chart.  Gynecologic History No LMP recorded. Patient has had a hysterectomy. Contraception: status post hysterectomy Last Pap: 15 yrs ago, normal.  Last mammogram: 10 yrs ago, normal per patient. Bone Density: 07/2015 Osteopenia T-Score -2.2 Colonoscopy: 06/2013  Obstetric History OB History  Gravida Para Term Preterm AB Living  3       0 3  SAB TAB Ectopic Multiple Live Births      0        # Outcome Date GA Lbr Len/2nd Weight Sex Delivery Anes PTL Lv  3 Gravida           2 Gravida           1 Gravida              ROS: A ROS was performed and pertinent positives and negatives are included in the history.  GENERAL: No fevers or chills. HEENT: No change in vision, no earache, sore throat or sinus congestion. NECK: No pain or stiffness. CARDIOVASCULAR: No chest pain or pressure. No palpitations. PULMONARY: No shortness of breath, cough or wheeze. GASTROINTESTINAL: No abdominal pain, nausea, vomiting or diarrhea, melena or bright red blood per rectum. GENITOURINARY: No urinary frequency, urgency, hesitancy or dysuria. MUSCULOSKELETAL: No joint or muscle pain, no back pain, no recent trauma. DERMATOLOGIC: No rash, no itching, no lesions. ENDOCRINE: No polyuria, polydipsia, no heat or cold intolerance. No recent change in weight. HEMATOLOGICAL: No anemia or easy bruising or bleeding. NEUROLOGIC: No headache, seizures, numbness, tingling or weakness. PSYCHIATRIC: No depression, no loss of interest in normal activity or change in  sleep pattern.     Exam:   BP 140/80 (BP Location: Right Arm, Patient Position: Sitting, Cuff Size: Large)   Temp 98 F (36.7 C) (Oral)   Ht 5\' 4"  (1.626 m)   Wt 197 lb (89.4 kg)   BMI 33.81 kg/m   Body mass index is 33.81 kg/m.  General appearance : Well developed well nourished female. No acute distress HEENT: Eyes: no retinal hemorrhage or exudates,  Neck supple, trachea midline, no carotid bruits, no thyroidmegaly Lungs: Clear to auscultation, no rhonchi or wheezes, or rib retractions  Heart: Regular rate and rhythm, no murmurs or gallops Breast:Examined in sitting and supine position were symmetrical in appearance, no palpable masses or tenderness,  no skin retraction, no nipple inversion, no nipple discharge, no skin discoloration, no axillary or supraclavicular lymphadenopathy Abdomen: no palpable masses or tenderness, no rebound or guarding Extremities: no edema or skin discoloration or tenderness  Pelvic: Vulva: Normal             Vagina: No gross lesions or discharge  Cervix/Uterus absent  Adnexa  Without masses or tenderness  Anus: Normal  U/A: Yellow clear, nitrites negative, white blood cells 6-10, red blood cells negative, few bacteria.   Assessment/Plan:  84 y.o. female for annual exam   1. Well female exam with routine gynecological exam Gynecologic exam status post total hysterectomy and menopause.  No need to perform a Pap  test at age 33, abstinent and no history of abnormal Paps.  Breast exam normal.  Health labs with family physician.  2. Dysuria Urine analysis mildly abnormal.  Urine culture pending.  Will wait on resolve before deciding on treatment. - Urinalysis,Complete w/RFL Culture  3. Pelvic pain in female Status post total hysterectomy.  Rule out ovarian pathology.  Follow-up with a pelvic ultrasound. - US Transvaginal Non-OB; Future  4. Acute vulvitis Wet prep neg.  Probably irritated by urine leakage.  Will control symptoms with Kenalog  ointment.  Apply a thin layer on irritated and inflamed vulva daily for 2 weeks.  Prescription sent to pharmacy. - WET PREP FOR [place], YEAST, CLUE  5. Screening for osteoporosis Last bone density in May 2017 showed osteopenia with the lowest T score at -2.2.  Will repeat bone density now.  Vitamin D supplements, calcium rich nutrition and regular weightbearing physical activity recommended. - DG Bone Density; Future  6. Osteopenia of multiple sites As above. - DG Bone Density; Future  Other orders - triamcinolone ointment (KENALOG) 0.1 %; Apply 1 application topically daily for 14 days.  Counseling on above issues and coordination of care more than 50% for 30 minutes.  Princess Bruins MD, 12:25 PM 09/17/2017

## 2017-09-19 LAB — URINALYSIS, COMPLETE W/RFL CULTURE
Bilirubin Urine: NEGATIVE
Glucose, UA: NEGATIVE
HYALINE CAST: NONE SEEN /LPF
Hgb urine dipstick: NEGATIVE
KETONES UR: NEGATIVE
Nitrites, Initial: NEGATIVE
RBC / HPF: NONE SEEN /HPF (ref 0–2)
Specific Gravity, Urine: 1.01 (ref 1.001–1.03)
pH: 5 (ref 5.0–8.0)

## 2017-09-19 LAB — URINE CULTURE
MICRO NUMBER: 90798343
SPECIMEN QUALITY:: ADEQUATE

## 2017-09-19 LAB — CULTURE INDICATED

## 2017-09-21 ENCOUNTER — Encounter: Payer: Self-pay | Admitting: Obstetrics & Gynecology

## 2017-09-21 NOTE — Patient Instructions (Signed)
1. Well female exam with routine gynecological exam Gynecologic exam status post total hysterectomy and menopause.  No need to perform a Pap test at age 84, abstinent and no history of abnormal Paps.  Breast exam normal.  Health labs with family physician.  2. Dysuria Urine analysis mildly abnormal.  Urine culture pending.  Will wait on resolve before deciding on treatment. - Urinalysis,Complete w/RFL Culture  3. Pelvic pain in female Status post total hysterectomy.  Rule out ovarian pathology.  Follow-up with a pelvic ultrasound. - US Transvaginal Non-OB; Future  4. Acute vulvitis Wet prep neg.  Probably irritated by urine leakage.  Will control symptoms with Kenalog ointment.  Apply a thin layer on irritated and inflamed vulva daily for 2 weeks.  Prescription sent to pharmacy. - WET PREP FOR [place], YEAST, CLUE  5. Screening for osteoporosis Last bone density in May 2017 showed osteopenia with the lowest T score at -2.2.  Will repeat bone density now.  Vitamin D supplements, calcium rich nutrition and regular weightbearing physical activity recommended. - DG Bone Density; Future  6. Osteopenia of multiple sites As above. - DG Bone Density; Future  Other orders - triamcinolone ointment (KENALOG) 0.1 %; Apply 1 application topically daily for 14 days.  [name], it was a pleasure meeting you today!  I will see you again soon for the pelvic ultrasound.

## 2017-09-23 ENCOUNTER — Telehealth: Payer: Self-pay

## 2017-09-23 NOTE — Telephone Encounter (Addendum)
Patient's daughter called.  At 09/17/17 visit you prescribed triamcinolone ointment 0.1%. She said in terms of how it helped her mom's symptoms it was a "miracle drug".    However, she said within 15 minutes of using her mom will had dizziness,nausea and got a severe headache that started at her ears and went up  She used Rx for 3 days and on the 4th day she did not use it and the symptom resolved.   She asked if there is a lower strength of this ointment that you could prescribe where maybe she would not have these side affects.

## 2017-09-23 NOTE — Telephone Encounter (Signed)
Could try Hydroxycortisone 1% over-the-counter.  That would be a slightly different and low potency cortisone cream.  Alternatively, can prescribe same medication but lower dosage with Triamcinolone 0.025 % and apply a very thin layer every 2 days.

## 2017-09-24 DIAGNOSIS — M316 Other giant cell arteritis: Secondary | ICD-10-CM | POA: Diagnosis not present

## 2017-09-24 DIAGNOSIS — Z79899 Other long term (current) drug therapy: Secondary | ICD-10-CM | POA: Diagnosis not present

## 2017-09-24 MED ORDER — TRIAMCINOLONE ACETONIDE 0.025 % EX OINT: TOPICAL_OINTMENT | CUTANEOUS | 0 refills | Status: DC

## 2017-09-24 NOTE — Telephone Encounter (Signed)
Spoke with daughter and read her Dr. Mariah Milling note. Daughter said that they would like to try the lower dose of the Triamcinolone since it helped so much. Hopefully with the lower dose and the new directions she will not have any side affects. If she does have side affects she will stop it and try the OTC Hydrocortisone cream 1%.

## 2017-09-29 ENCOUNTER — Other Ambulatory Visit: Payer: Self-pay | Admitting: Gynecology

## 2017-09-29 DIAGNOSIS — M8589 Other specified disorders of bone density and structure, multiple sites: Secondary | ICD-10-CM

## 2017-09-29 DIAGNOSIS — Z780 Asymptomatic menopausal state: Secondary | ICD-10-CM

## 2017-10-01 ENCOUNTER — Ambulatory Visit (INDEPENDENT_AMBULATORY_CARE_PROVIDER_SITE_OTHER): Payer: Medicare Other

## 2017-10-01 ENCOUNTER — Other Ambulatory Visit: Payer: Self-pay | Admitting: Gynecology

## 2017-10-01 DIAGNOSIS — Z780 Asymptomatic menopausal state: Secondary | ICD-10-CM

## 2017-10-01 DIAGNOSIS — M810 Age-related osteoporosis without current pathological fracture: Secondary | ICD-10-CM

## 2017-10-01 DIAGNOSIS — M8589 Other specified disorders of bone density and structure, multiple sites: Secondary | ICD-10-CM

## 2017-10-02 ENCOUNTER — Telehealth: Payer: Self-pay | Admitting: Gynecology

## 2017-10-02 ENCOUNTER — Encounter: Payer: Self-pay | Admitting: Gynecology

## 2017-10-02 NOTE — Telephone Encounter (Signed)
Tell patient that her bone density does show osteoporosis in the left femoral neck of the left hip.  This puts her at a higher risk of hip fracture along with her history of chronic steroid use.  I would recommend office visit with Dr Dellis Filbert to discuss treatment options.

## 2017-10-03 NOTE — Telephone Encounter (Signed)
Patient daughter [name] (has dpr access) informed with results will relay to patient.

## 2017-10-04 ENCOUNTER — Other Ambulatory Visit: Payer: Self-pay | Admitting: Family Medicine

## 2017-10-08 ENCOUNTER — Other Ambulatory Visit: Payer: Self-pay | Admitting: Obstetrics & Gynecology

## 2017-10-08 ENCOUNTER — Ambulatory Visit (INDEPENDENT_AMBULATORY_CARE_PROVIDER_SITE_OTHER): Payer: Medicare Other

## 2017-10-08 ENCOUNTER — Ambulatory Visit (INDEPENDENT_AMBULATORY_CARE_PROVIDER_SITE_OTHER): Payer: Medicare Other | Admitting: Obstetrics & Gynecology

## 2017-10-08 ENCOUNTER — Telehealth: Payer: Self-pay | Admitting: *Deleted

## 2017-10-08 DIAGNOSIS — N393 Stress incontinence (female) (male): Secondary | ICD-10-CM

## 2017-10-08 DIAGNOSIS — R102 Pelvic and perineal pain: Secondary | ICD-10-CM

## 2017-10-08 DIAGNOSIS — M810 Age-related osteoporosis without current pathological fracture: Secondary | ICD-10-CM | POA: Diagnosis not present

## 2017-10-08 DIAGNOSIS — N83202 Unspecified ovarian cyst, left side: Secondary | ICD-10-CM

## 2017-10-08 DIAGNOSIS — R1031 Right lower quadrant pain: Secondary | ICD-10-CM | POA: Diagnosis not present

## 2017-10-08 NOTE — Telephone Encounter (Signed)
-----   Message from Princess Bruins, MD sent at 10/08/2017 10:50 AM EDT ----- Regarding: Refer to Physical therapy SUI and Pelvic pain for PT of pelvis/pelvic floor.  Would like to see Thamas Jaegers.

## 2017-10-08 NOTE — Telephone Encounter (Signed)
Referral placed at [facility] [place] for PT, they will call to schedule.

## 2017-10-08 NOTE — Progress Notes (Signed)
    [name] Sep 28, 1929 [id number]        84 y.o.  G3P0003   RP: Right lower quadrant discomfort for Pelvic US  HPI: Patient complaining of right lower quadrant discomfort especially when ambulating.  Also has a history of lower back pain.  Complains of stress urinary incontinence which interferes with daily life.  Patient has to wear protection all the time.   OB History  Gravida Para Term Preterm AB Living  3       0 3  SAB TAB Ectopic Multiple Live Births      0        # Outcome Date GA Lbr Len/2nd Weight Sex Delivery Anes PTL Lv  3 Gravida           2 Gravida           1 Gravida             Past medical history,surgical history, problem list, medications, allergies, family history and social history were all reviewed and documented in the EPIC chart.   Directed ROS with pertinent positives and negatives documented in the history of present illness/assessment and plan.  Exam:  There were no vitals filed for this visit. General appearance:  Normal  Pelvic US today: T/V and T/A images.  S/P Total Hysterectomy.  Vaginal cuff with a cyst measuring 7 mm x 5 mm.  Right ovary not seen.  No mass or cyst in the right adnexa.  Left ovary atrophic with an echofree cyst measuring 7 x 7 mm.   Arterial blood flow present to the right ovary with positive color flow doppler.  No free fluid in the posterior cul-de-sac.   Assessment/Plan:  84 y.o. G3P0003   1. Right lower quadrant abdominal pain Pelvic ultrasound reassuring with no concern for malignant process.  Pelvic ultrasound findings reviewed with patient.  Possible musculoskeletal pain in the right pelvis versus radiating pain from the lower back.  Will try physical therapy to verify for position of the pelvis and muscle spasms.  If no improvement will consult with her family physician for further investigation.  2. SUI (stress urinary incontinence, female) Significant stress urinary incontinence.  Patient has to use protection  all the time.  Will work on pelvic floor reinforcement with physical therapy.   3. Age-related osteoporosis without current pathological fracture Diagnosis of osteoporosis and associated risk of fracture discussed with patient.  Patient has significant risk of falls.  Recommend Prolia.  Information on Prolia and pamphlet given.  Patient will follow-up to discuss further after reading.  F/U discuss Prolia.  Counseling on above issues and coordination of care more than 50% for 25 minutes.  Princess Bruins MD, 10:26 AM 10/08/2017

## 2017-10-12 ENCOUNTER — Encounter: Payer: Self-pay | Admitting: Obstetrics & Gynecology

## 2017-10-12 NOTE — Patient Instructions (Signed)
1. Right lower quadrant abdominal pain Pelvic ultrasound reassuring with no concern for malignant process.  Pelvic ultrasound findings reviewed with patient.  Possible musculoskeletal pain in the right pelvis versus radiating pain from the lower back.  Will try physical therapy to verify for position of the pelvis and muscle spasms.  If no improvement will consult with her family physician for further investigation.  2. SUI (stress urinary incontinence, female) Significant stress urinary incontinence.  Patient has to use protection all the time.  Will work on pelvic floor reinforcement with physical therapy.   3. Age-related osteoporosis without current pathological fracture Diagnosis of osteoporosis and associated risk of fracture discussed with patient.  Patient has significant risk of falls.  Recommend Prolia.  Information on Prolia and pamphlet given.  Patient will follow-up to discuss further after reading.  F/U discuss Prolia.  [name], it was a pleasure seeing you today!   Kegel Exercises Kegel exercises help strengthen the muscles that support the rectum, vagina, small intestine, bladder, and uterus. Doing Kegel exercises can help:  Improve bladder and bowel control.  Improve sexual response.  Reduce problems and discomfort during pregnancy.  Kegel exercises involve squeezing your pelvic floor muscles, which are the same muscles you squeeze when you try to stop the flow of urine. The exercises can be done while sitting, standing, or lying down, but it is best to vary your position. Phase 1 exercises 1. Squeeze your pelvic floor muscles tight. You should feel a tight lift in your rectal area. If you are a female, you should also feel a tightness in your vaginal area. Keep your stomach, buttocks, and legs relaxed. 2. Hold the muscles tight for up to 10 seconds. 3. Relax your muscles. Repeat this exercise 50 times a day or as many times as told by your health care provider. Continue to  do this exercise for at least 4-6 weeks or for as long as told by your health care provider. This information is not intended to replace advice given to you by your health care provider. Make sure you discuss any questions you have with your health care provider. Document Released: 02/19/2012 Document Revised: 10/28/2015 Document Reviewed: 01/22/2015 Elsevier Interactive Patient Education  [employer].

## 2017-10-13 NOTE — Telephone Encounter (Signed)
Patient scheduled on 10/16/17 @ 11:45am

## 2017-10-16 ENCOUNTER — Ambulatory Visit: Payer: Medicare Other | Admitting: Physical Therapy

## 2017-10-23 DIAGNOSIS — Z79899 Other long term (current) drug therapy: Secondary | ICD-10-CM | POA: Diagnosis not present

## 2017-10-23 DIAGNOSIS — M316 Other giant cell arteritis: Secondary | ICD-10-CM | POA: Diagnosis not present

## 2017-11-06 ENCOUNTER — Ambulatory Visit (INDEPENDENT_AMBULATORY_CARE_PROVIDER_SITE_OTHER): Payer: Medicare Other | Admitting: Specialist

## 2017-11-10 ENCOUNTER — Other Ambulatory Visit (INDEPENDENT_AMBULATORY_CARE_PROVIDER_SITE_OTHER): Payer: Self-pay | Admitting: Specialist

## 2017-11-10 NOTE — Telephone Encounter (Signed)
Tramadol refill request 

## 2017-11-11 ENCOUNTER — Other Ambulatory Visit (INDEPENDENT_AMBULATORY_CARE_PROVIDER_SITE_OTHER): Payer: Self-pay | Admitting: Specialist

## 2017-11-12 NOTE — Telephone Encounter (Signed)
Tramadol refill request 

## 2017-11-13 NOTE — Telephone Encounter (Signed)
Called to [facility] 

## 2017-11-13 NOTE — Telephone Encounter (Signed)
Called rx to [facility]

## 2017-11-20 DIAGNOSIS — Z79899 Other long term (current) drug therapy: Secondary | ICD-10-CM | POA: Diagnosis not present

## 2017-11-20 DIAGNOSIS — M316 Other giant cell arteritis: Secondary | ICD-10-CM | POA: Diagnosis not present

## 2017-11-27 DIAGNOSIS — Z79899 Other long term (current) drug therapy: Secondary | ICD-10-CM | POA: Diagnosis not present

## 2017-11-27 DIAGNOSIS — M316 Other giant cell arteritis: Secondary | ICD-10-CM | POA: Diagnosis not present

## 2017-11-27 DIAGNOSIS — R748 Abnormal levels of other serum enzymes: Secondary | ICD-10-CM | POA: Diagnosis not present

## 2017-12-18 DIAGNOSIS — Z79899 Other long term (current) drug therapy: Secondary | ICD-10-CM | POA: Diagnosis not present

## 2017-12-18 DIAGNOSIS — M316 Other giant cell arteritis: Secondary | ICD-10-CM | POA: Diagnosis not present

## 2018-01-01 ENCOUNTER — Ambulatory Visit (INDEPENDENT_AMBULATORY_CARE_PROVIDER_SITE_OTHER): Payer: Medicare Other

## 2018-01-01 DIAGNOSIS — Z23 Encounter for immunization: Secondary | ICD-10-CM | POA: Diagnosis not present

## 2018-01-02 ENCOUNTER — Other Ambulatory Visit: Payer: Self-pay | Admitting: Family Medicine

## 2018-01-02 NOTE — Telephone Encounter (Signed)
Spoke with patients daughter [name] her mom does have enough medication to get her through until her appt.

## 2018-01-02 NOTE — Telephone Encounter (Signed)
Please call patient or [name] (daughter): Receive refill request for her prozac. Margart has an upcoming appt next week- I will refill this at that time- as long as she has enough to get her through until then. Please make sure she has enough meds to get her to her appt.

## 2018-01-13 ENCOUNTER — Encounter: Payer: Self-pay | Admitting: Family Medicine

## 2018-01-13 ENCOUNTER — Telehealth: Payer: Self-pay | Admitting: Family Medicine

## 2018-01-13 ENCOUNTER — Ambulatory Visit (INDEPENDENT_AMBULATORY_CARE_PROVIDER_SITE_OTHER): Payer: Medicare Other | Admitting: Family Medicine

## 2018-01-13 VITALS — BP 149/63 | HR 87 | Temp 98.8°F | Resp 20 | Ht 64.0 in | Wt 196.0 lb

## 2018-01-13 DIAGNOSIS — D508 Other iron deficiency anemias: Secondary | ICD-10-CM

## 2018-01-13 DIAGNOSIS — I10 Essential (primary) hypertension: Secondary | ICD-10-CM

## 2018-01-13 DIAGNOSIS — J4540 Moderate persistent asthma, uncomplicated: Secondary | ICD-10-CM | POA: Diagnosis not present

## 2018-01-13 DIAGNOSIS — Z79899 Other long term (current) drug therapy: Secondary | ICD-10-CM

## 2018-01-13 DIAGNOSIS — E782 Mixed hyperlipidemia: Secondary | ICD-10-CM

## 2018-01-13 DIAGNOSIS — F418 Other specified anxiety disorders: Secondary | ICD-10-CM | POA: Diagnosis not present

## 2018-01-13 DIAGNOSIS — E781 Pure hyperglyceridemia: Secondary | ICD-10-CM

## 2018-01-13 LAB — LDL CHOLESTEROL, DIRECT: LDL DIRECT: 129 mg/dL

## 2018-01-13 LAB — LIPID PANEL
CHOL/HDL RATIO: 7
Cholesterol: 284 mg/dL — ABNORMAL HIGH (ref 0–200)
HDL: 41.7 mg/dL (ref >39.00)
Triglycerides: 753 mg/dL — ABNORMAL HIGH (ref 0.0–149.0)

## 2018-01-13 LAB — TSH: TSH: 1.75 u[IU]/mL (ref 0.35–4.50)

## 2018-01-13 MED ORDER — ALBUTEROL SULFATE HFA 108 (90 BASE) MCG/ACT IN AERS: INHALATION_SPRAY | RESPIRATORY_TRACT | 3 refills | Status: DC

## 2018-01-13 MED ORDER — FLUOXETINE HCL 20 MG PO CAPS: 60.0000 mg | ORAL_CAPSULE | Freq: Every day | ORAL | 1 refills | Status: DC

## 2018-01-13 MED ORDER — DIAZEPAM 5 MG PO TABS: 2.5000 mg | ORAL_TABLET | Freq: Two times a day (BID) | ORAL | 1 refills | Status: DC | PRN

## 2018-01-13 MED ORDER — OMEGA-3-ACID ETHYL ESTERS 1 G PO CAPS: 2.0000 g | ORAL_CAPSULE | Freq: Two times a day (BID) | ORAL | 3 refills | Status: DC

## 2018-01-13 MED ORDER — OLMESARTAN MEDOXOMIL 20 MG PO TABS: 30.0000 mg | ORAL_TABLET | Freq: Every day | ORAL | 1 refills | Status: DC

## 2018-01-13 NOTE — Patient Instructions (Signed)
We will call you with lab results.  I have refilled your meds.   Follow up in 6 mos as long as doing well.    Hypertension Hypertension is another name for high blood pressure. High blood pressure forces your heart to work harder to pump blood. This can cause problems over time. There are two numbers in a blood pressure reading. There is a top number (systolic) over a bottom number (diastolic). It is best to have a blood pressure below 120/80. Healthy choices can help lower your blood pressure. You may need medicine to help lower your blood pressure if:  Your blood pressure cannot be lowered with healthy choices.  Your blood pressure is higher than 130/80.  Follow these instructions at home: Eating and drinking  If directed, follow the DASH eating plan. This diet includes: ? Filling half of your plate at each meal with fruits and vegetables. ? Filling one quarter of your plate at each meal with whole grains. Whole grains include whole wheat pasta, brown rice, and whole grain bread. ? Eating or drinking low-fat dairy products, such as skim milk or low-fat yogurt. ? Filling one quarter of your plate at each meal with low-fat (lean) proteins. Low-fat proteins include fish, skinless chicken, eggs, beans, and tofu. ? Avoiding fatty meat, cured and processed meat, or chicken with skin. ? Avoiding premade or processed food.  Eat less than 1,500 mg of salt (sodium) a day.  Limit alcohol use to no more than 1 drink a day for nonpregnant women and 2 drinks a day for men. One drink equals 12 oz of beer, 5 oz of wine, or 1 oz of hard liquor. Lifestyle  Work with your doctor to stay at a healthy weight or to lose weight. Ask your doctor what the best weight is for you.  Get at least 30 minutes of exercise that causes your heart to beat faster (aerobic exercise) most days of the week. This may include walking, swimming, or biking.  Get at least 30 minutes of exercise that strengthens your  muscles (resistance exercise) at least 3 days a week. This may include lifting weights or pilates.  Do not use any products that contain nicotine or tobacco. This includes cigarettes and e-cigarettes. If you need help quitting, ask your doctor.  Check your blood pressure at home as told by your doctor.  Keep all follow-up visits as told by your doctor. This is important. Medicines  Take over-the-counter and prescription medicines only as told by your doctor. Follow directions carefully.  Do not skip doses of blood pressure medicine. The medicine does not work as well if you skip doses. Skipping doses also puts you at risk for problems.  Ask your doctor about side effects or reactions to medicines that you should watch for. Contact a doctor if:  You think you are having a reaction to the medicine you are taking.  You have headaches that keep coming back (recurring).  You feel dizzy.  You have swelling in your ankles.  You have trouble with your vision. Get help right away if:  You get a very bad headache.  You start to feel confused.  You feel weak or numb.  You feel faint.  You get very bad pain in your: ? Chest. ? Belly (abdomen).  You throw up (vomit) more than once.  You have trouble breathing. Summary  Hypertension is another name for high blood pressure.  Making healthy choices can help lower blood pressure. If your blood  pressure cannot be controlled with healthy choices, you may need to take medicine. This information is not intended to replace advice given to you by your health care provider. Make sure you discuss any questions you have with your health care provider. Document Released: 08/21/2007 Document Revised: 01/31/2016 Document Reviewed: 01/31/2016 Elsevier Interactive Patient Education  [employer].

## 2018-01-13 NOTE — Progress Notes (Signed)
[name] , [birth year], 84 y.o., female MRN: [id number] Patient Care Team    Relationship Specialty Notifications Start End  Ma Hillock, DO PCP - General Family Medicine  03/16/15   Hermelinda Medicus, MD  Internal Medicine  07/11/15    Comment: Rheumotology  Fay Records, MD Consulting Physician Cardiology  07/11/15   Lafayette Dragon, MD (Inactive)  Gastroenterology  07/11/15   Tanda Rockers, MD Consulting Physician Pulmonary Disease  07/11/15   Jessy Oto, MD Consulting Physician Orthopedic Surgery  07/12/16   Magnus Sinning, MD Consulting Physician Physical Medicine and Rehabilitation  07/12/16   [facility], [facility]    07/15/17   [place]  Dentistry  07/15/17     Chief Complaint  Patient presents with  . Hypertension  . Hyperlipidemia  . Depression  . Anxiety    Subjective:  Hypertension/iron deficiency/hyperlipidemia: Pt reports compliance with Benicar 20 mg QD. Blood pressures ranges at home not routinely checked. Patient denies chest pain, shortness of breath, dizziness or lower extremity edema.   Pt taking daily baby ASA. Pt is intolerant of statin. BMP:05/20/2017 within normal limits 11/20/2017 labs in [facility] CBC:05/20/2017 hemoglobin 15.8, hematocrit 46.9. Diet: Low-sodium Exercise: Not routinely RF: Hypertension, hyperlipidemia, obesity, Fhx HD  Depression with anxiety: She reports she is still doing okay on Prozac.  We did decrease her dose from 60 to 40 mg last time.  Her daughter is with her today and states she thinks she could use an increase back to the 60 mg a day.  Patient agrees.  She continues to take the Valium 2.5 mg occasionally.  Does need refills on these medications today.    Asthma: Patient reports compliance with Symbicort 2 puffs twice a day.  Feels her asthma is well controlled.   Depression screen [facility] 2/9 01/13/2018 07/15/2017 07/15/2017 04/01/2017 01/28/2017  Decreased Interest - 0 0 0 0  Down, Depressed, Hopeless 1 0 0 0 0  PHQ - 2 Score 1 0 0  0 0  Altered sleeping 1 0 0 0 1  Tired, decreased energy 1 0 1 0 1  Change in appetite 0 0 0 0 0  Feeling bad or failure about yourself  0 0 0 0 0  Trouble concentrating 1 0 0 0 0  Moving slowly or fidgety/restless 0 0 0 0 -  Suicidal thoughts 0 0 0 0 0  PHQ-9 Score 4 0 1 0 2  Difficult doing work/chores Somewhat difficult - - - -     Allergies  Allergen Reactions  . Atorvastatin Other (See Comments)    Severe headache  . Statins   . Advair Diskus [Fluticasone-Salmeterol] Other (See Comments)    Mouth sores  . Sulfonamide Derivatives Rash   Social History   Tobacco Use  . Smoking status: Never Smoker  . Smokeless tobacco: Never Used  Substance Use Topics  . Alcohol use: No   Past Medical History:  Diagnosis Date  . Abdominal bloating   . Abnormal LFTs (liver function tests)   . Anxiety disorder   . Arthritis   . Asthma   . Depression   . Diverticulosis   . Edema   . Fatty liver   . Fibromyalgia   . GERD (gastroesophageal reflux disease)   . Giant cell arteritis (HCC)    No biopsy secondary to steroid use  . Hx of adenomatous colonic polyps   . Hyperlipidemia   . Hypertension   . IBS (irritable bowel syndrome)   .  Lumbar radiculitis   . Osteoporosis 09/2017   T score -2.6 left femoral neck  . Peptic ulcer disease   . Skin cancer    shoulder, leg, right eye (? squamous by resection)  . Thoracic radiculitis   . Thyroid nodule    "removed"  . Urinary incontinence   . Vertigo    Past Surgical History:  Procedure Laterality Date  . ABDOMINAL HYSTERECTOMY    . BACK SURGERY  2005   lower back  . EYE SURGERY Right 2006  . LIVER BIOPSY     fatty liver  . NASAL SEPTUM SURGERY    . right knee surgery  2006   arthroscopy  . right leg skin cancer surgery  2014  . right shoulder skin cancer excision  2012  . THYROIDECTOMY     Family History  Problem Relation Age of Onset  . Colon polyps Maternal Aunt   . Diabetes Father   . Heart disease Father   .  Heart disease Mother   . Lung disease Mother   . COPD Mother   . Lung cancer Brother   . Cancer Brother        lung  . COPD Brother   . Stroke Brother   . Lung cancer Sister   . COPD Sister   . Kidney disease Unknown        niece-mat side  . Arthritis Unknown   . Diabetes Son   . Arthritis Daughter   . Colon cancer Neg Hx    Allergies as of 01/13/2018      Reactions   Atorvastatin Other (See Comments)   Severe headache   Statins    Advair Diskus [fluticasone-salmeterol] Other (See Comments)   Mouth sores   Sulfonamide Derivatives Rash      Medication List        Accurate as of 01/13/18 10:17 AM. Always use your most recent med list.          albuterol 108 (90 Base) MCG/ACT inhaler Commonly known as:  PROVENTIL HFA;VENTOLIN HFA 2 puffs up to every 4 hours if can't catch your breath   aspirin 81 MG tablet Take 81 mg by mouth daily.   conjugated estrogens vaginal cream Commonly known as:  PREMARIN Apply topically to urethral meatus area.   diazepam 5 MG tablet Commonly known as:  VALIUM Take 0.5 tablets (2.5 mg total) by mouth every 12 (twelve) hours as needed for anxiety. Take 1/2 tablet by mouth twice daily as needed for back spasms   FLUoxetine 20 MG capsule Commonly known as:  PROZAC TAKE 3 CAPSULES DAILY   lidocaine 5 % Commonly known as:  LIDODERM Place 1 patch daily onto the skin. Remove & Discard patch within 12 hours or as directed by MD   olmesartan 20 MG tablet Commonly known as:  BENICAR Take 1.5 tablets (30 mg total) by mouth daily.   omeprazole 40 MG capsule Commonly known as:  PRILOSEC Take by mouth.   predniSONE 5 MG tablet Commonly known as:  DELTASONE Take 5 mg by mouth daily.   SYMBICORT 80-4.5 MCG/ACT inhaler Generic drug:  budesonide-formoterol USE 2 INHALATIONS TWICE A DAY   tocilizumab 4 mg/kg in sodium chloride 0.9 % Inject 280 mg into the vein.   tocilizumab 400 MG/20ML Soln injection Commonly known as:   ACTEMRA Infuse 480 mg into the vein once every 28 days.   traMADol 50 MG tablet Commonly known as:  ULTRAM TAKE ONE TABLET BY MOUTH EVERY 6  HOURS AS NEEDED FOR MODERATE PAIN   triamcinolone 0.025 % ointment Commonly known as:  KENALOG Apply a very thin layer to affected area every 2 days.       No results found for this or any previous visit (from the past 24 hour(s)). No results found.   ROS: Negative, with the exception of above mentioned in HPI   Objective:  BP (!) 149/63 (BP Location: Left Arm, Patient Position: Sitting, Cuff Size: Large)   Pulse 87   Temp 98.8 F (37.1 C)   Resp 20   Ht 5\' 4"  (1.626 m)   Wt 196 lb (88.9 kg)   SpO2 93%   BMI 33.64 kg/m  Body mass index is 33.64 kg/m.  Gen: Afebrile. No acute distress.  Nontoxic and presentation.  Obese, very pleasant, Caucasian female. HENT: AT. [place].  MMM.  Eyes:Pupils Equal Round Reactive to light, Extraocular movements intact,  Conjunctiva without redness, discharge or icterus. Neck/lymp/endocrine: Supple, no lymphadenopathy, no thyromegaly CV: RRR no murmur, no edema, +2/4 P posterior tibialis pulses Chest: CTAB, no wheeze or crackles Abd: Soft.  Obese. NTND. BS present.  No masses palpated.  Skin: No rashes, purpura or petechiae.  Neuro:  Normal gait. PERLA. EOMi. Alert. Oriented x3  Psych: Normal affect, dress and demeanor. Normal speech. Normal thought content and judgment.    Assessment/Plan: [name] is a 84 y.o. female present for acute OV for  Essential hypertension, benign/hyperlipidemia/morbid obesity - tocilizumab infusions and daily prednisone may be causing the increase in BP. - -Continue Benicar 30 mg daily.  Has been intolerant to higher doses of Benicar and multiple other blood pressure regimens.  Is tapering down on her prednisone, so hopefully her blood pressure also returns to normal prednisone is discontinued. - Low-sodium diet - Attempt to stay as active as possible - Continue baby  aspirin - Intolerant to statins; could consider Lovaza  versus Zetia or WelChol.  Repeat lipids today. -TSH - Follow-up 6 months  Moderate persistent asthma without complication Stable.  Continue Symbicort 2 puffs twice a day.  Continue albuterol 1 to 2 puffs every 6 hours as needed.  Feels provided today.  Giant cell arteritis (HCC) Polymyalgia rheumatica (HCC) Stable.  Continue to follow with rheumatology. Recently changed to tocilizumab injections and DC'd methotrexate. chronic steroids continued, but she is tapering down.  Depression with anxiety Doing well on Prozac, she felt the higher dose was more beneficial.  Will return to Prozac 60 mg daily. -Follow-up 6 months  > 25 minutes spent with patient, >50% of time spent face to face  electronically signed by:  Howard Pouch, DO  [place]

## 2018-01-13 NOTE — Telephone Encounter (Signed)
Please inform patient the following information: Her triglycerides are severely high > 700 (goal <150). I would recommend we start a medication.  - Our choices are: one is omega 3 (fish oil based) and the other is fiber based. Neither are statins (she is allergic to those).  - I have called in the lovaza (fish oil based) first. We may have to perform a prior auth on this med. We will retest her triglycerides at her next scheduled appt.  - thyroid function is normal

## 2018-01-14 NOTE — Telephone Encounter (Signed)
Spoke with patient's daughter [name] reviewed lab results and instructions she verbalized understanding.

## 2018-01-15 DIAGNOSIS — M316 Other giant cell arteritis: Secondary | ICD-10-CM | POA: Diagnosis not present

## 2018-01-16 ENCOUNTER — Encounter: Payer: Self-pay | Admitting: Family Medicine

## 2018-02-17 DIAGNOSIS — Z79899 Other long term (current) drug therapy: Secondary | ICD-10-CM | POA: Diagnosis not present

## 2018-02-17 DIAGNOSIS — M316 Other giant cell arteritis: Secondary | ICD-10-CM | POA: Diagnosis not present

## 2018-03-17 DIAGNOSIS — M316 Other giant cell arteritis: Secondary | ICD-10-CM | POA: Diagnosis not present

## 2018-04-16 DIAGNOSIS — M316 Other giant cell arteritis: Secondary | ICD-10-CM | POA: Diagnosis not present

## 2018-04-30 DIAGNOSIS — M2550 Pain in unspecified joint: Secondary | ICD-10-CM | POA: Diagnosis not present

## 2018-04-30 DIAGNOSIS — Z79899 Other long term (current) drug therapy: Secondary | ICD-10-CM | POA: Diagnosis not present

## 2018-04-30 DIAGNOSIS — M316 Other giant cell arteritis: Secondary | ICD-10-CM | POA: Diagnosis not present

## 2018-05-14 DIAGNOSIS — M316 Other giant cell arteritis: Secondary | ICD-10-CM | POA: Diagnosis not present

## 2018-06-11 DIAGNOSIS — M316 Other giant cell arteritis: Secondary | ICD-10-CM | POA: Diagnosis not present

## 2018-07-09 DIAGNOSIS — M316 Other giant cell arteritis: Secondary | ICD-10-CM | POA: Diagnosis not present

## 2018-07-09 LAB — BASIC METABOLIC PANEL
Creatinine: 0.9 (ref 0.5–1.1)
Potassium: 3.9 (ref 3.4–5.3)
Sodium: 140 (ref 137–147)

## 2018-07-09 LAB — HEPATIC FUNCTION PANEL
ALT: 33 (ref 7–35)
AST: 32 (ref 13–35)
Bilirubin, Total: 0.5

## 2018-07-09 LAB — POCT ERYTHROCYTE SEDIMENTATION RATE, NON-AUTOMATED: Sed Rate: 1

## 2018-07-21 ENCOUNTER — Ambulatory Visit: Payer: Medicare Other

## 2018-07-28 ENCOUNTER — Ambulatory Visit: Payer: Medicare Other | Admitting: Family Medicine

## 2018-07-28 ENCOUNTER — Encounter: Payer: Self-pay | Admitting: Family Medicine

## 2018-07-28 ENCOUNTER — Ambulatory Visit: Payer: Medicare Other

## 2018-07-28 ENCOUNTER — Ambulatory Visit (INDEPENDENT_AMBULATORY_CARE_PROVIDER_SITE_OTHER): Payer: Medicare Other | Admitting: Family Medicine

## 2018-07-28 VITALS — BP 136/75 | HR 86 | Ht 64.0 in | Wt 196.0 lb

## 2018-07-28 DIAGNOSIS — R300 Dysuria: Secondary | ICD-10-CM | POA: Diagnosis not present

## 2018-07-28 DIAGNOSIS — N762 Acute vulvitis: Secondary | ICD-10-CM

## 2018-07-28 DIAGNOSIS — F418 Other specified anxiety disorders: Secondary | ICD-10-CM

## 2018-07-28 DIAGNOSIS — E782 Mixed hyperlipidemia: Secondary | ICD-10-CM | POA: Diagnosis not present

## 2018-07-28 DIAGNOSIS — I10 Essential (primary) hypertension: Secondary | ICD-10-CM | POA: Diagnosis not present

## 2018-07-28 DIAGNOSIS — J4540 Moderate persistent asthma, uncomplicated: Secondary | ICD-10-CM

## 2018-07-28 DIAGNOSIS — E781 Pure hyperglyceridemia: Secondary | ICD-10-CM

## 2018-07-28 MED ORDER — OMEGA-3-ACID ETHYL ESTERS 1 G PO CAPS: 2.0000 g | ORAL_CAPSULE | Freq: Two times a day (BID) | ORAL | 3 refills | Status: DC

## 2018-07-28 MED ORDER — FLUOXETINE HCL 20 MG PO CAPS: 60.0000 mg | ORAL_CAPSULE | Freq: Every day | ORAL | 1 refills | Status: DC

## 2018-07-28 MED ORDER — OLMESARTAN MEDOXOMIL 20 MG PO TABS: 30.0000 mg | ORAL_TABLET | Freq: Every day | ORAL | 1 refills | Status: DC

## 2018-07-28 MED ORDER — DIAZEPAM 5 MG PO TABS: ORAL_TABLET | ORAL | 5 refills | Status: DC

## 2018-07-28 MED ORDER — BUDESONIDE-FORMOTEROL FUMARATE 80-4.5 MCG/ACT IN AERO: INHALATION_SPRAY | RESPIRATORY_TRACT | 12 refills | Status: DC

## 2018-07-28 NOTE — Progress Notes (Signed)
VIRTUAL VISIT VIA VIDEO  I connected with [name] on 07/31/18 at  2:40 PM EDT by a video enabled telemedicine application and verified that I am speaking with the correct person using two identifiers. Location patient: Home Location provider: [facility], Office Persons participating in the virtual visit: Patient, Dr. Raoul Pitch and R.Baker, LPN  I discussed the limitations of evaluation and management by telemedicine and the availability of in person appointments. The patient expressed understanding and agreed to proceed.  Patient Care Team    Relationship Specialty Notifications Start End  Ma Hillock, DO PCP - General Family Medicine  03/16/15   Hermelinda Medicus, MD  Internal Medicine  07/11/15    Comment: Rheumotology  Fay Records, MD Consulting Physician Cardiology  07/11/15   Lafayette Dragon, MD (Inactive)  Gastroenterology  07/11/15   Tanda Rockers, MD Consulting Physician Pulmonary Disease  07/11/15   Jessy Oto, MD Consulting Physician Orthopedic Surgery  07/12/16   Magnus Sinning, MD Consulting Physician Physical Medicine and Rehabilitation  07/12/16   [facility], Skin Surgery    07/15/17   [place]  Dentistry  07/15/17      SUBJECTIVE Chief Complaint  Patient presents with  . Hypertension    Pt takes BP at home. 05/11 164/67BP 86P 90%, 05/12 136/75BP 86 P 92%O2    HPI:  Hypertension/iron deficiency/hyperlipidemia: Pt reports compliance with Benicar 30 mg QD. Blood pressures ranges at home are normal. Patient denies chest pain, shortness of breath, dizziness or lower extremity edema.  Pt taking daily baby ASA. Pt is intolerant of statin. BMP:05/20/2017 within normal limits 11/20/2017 labs in [facility] CBC:05/20/2017 hemoglobin 15.8, hematocrit 46.9. TSH: 1.75 01/13/2018 Cholesterol T284, H41, L129, tg 753 Diet: Low-sodium Exercise: Not routinely RF: Hypertension, hyperlipidemia, obesity, Fhx HD  Depression with anxiety: She reports she is still doing well on  Prozac 60 mg Qg- we did try to decrease dose this year- but anxiety increased.  She continues to take the Valium 2.5/5 mg occasionally.  Does need refills on these medications today.   Indication for chronic benzo: Valium- anxiety and lower back spasms Medication and dose: Valium 2.5 mg/5 mg # pills per month: #45 Last UDS date: next appt.  Pain contract signed (Y/N): Y Date narcotic database last reviewed (include red flags):07/31/18   Asthma: Patient reports compliance with Symbicort 2 puffs twice a day.  Feels her asthma is well controlled   Dysuria: Patient reports an episode of burning with urination for the past 3 to 4 weeks.  She denies fevers, chills, nausea or vomiting.  She denies hematuria.  She states that she use the triamcinolone cream application surrounding her urethra and she believes her symptoms resolved but is still concerned she has a urinary check infection.  ROS: See pertinent positives and negatives per HPI.  Patient Active Problem List   Diagnosis Date Noted  . Mixed stress and urge urinary incontinence 09/04/2017  . Elevated hemoglobin ([place]) 04/04/2017  . Chronic back pain 01/28/2017  . Morbid obesity ([place]) 01/28/2017  . Peptic ulcer disease   . Lumbar radiculitis   . Frequent falls 08/02/2016  . Hypertensive retinopathy of both eyes 09/07/2015  . Medicare annual wellness visit, subsequent 07/11/2015  . Long-term use of high-risk medication 07/11/2015  . Osteopenia 04/11/2015  . Depression with anxiety 04/11/2015  . Giant cell arteritis ([place]) 03/28/2015  . Polymyalgia rheumatica ([place]) 03/16/2015  . Chronic narcotic use 03/16/2015  . Anemia, iron deficiency 07/14/2013  . Asthma, moderate  persistent 10/15/2012  . Hyperlipidemia 12/27/2008  . HYPERTENSION, BENIGN 12/27/2008    Social History   Tobacco Use  . Smoking status: Never Smoker  . Smokeless tobacco: Never Used  Substance Use Topics  . Alcohol use: No    Current Outpatient Medications:  .   albuterol (PROAIR HFA) 108 (90 Base) MCG/ACT inhaler, 2 puffs up to every 4 hours if can't catch your breath, Disp: 3 Inhaler, Rfl: 3 .  aspirin 81 MG tablet, Take 81 mg by mouth daily.  , Disp: , Rfl:  .  budesonide-formoterol (SYMBICORT) 80-4.5 MCG/ACT inhaler, USE 2 INHALATIONS TWICE A DAY, Disp: 30.6 g, Rfl: 12 .  diazepam (VALIUM) 5 MG tablet, 1/2 tab PO in the morning and 1 tab QHS PRN., Disp: 45 tablet, Rfl: 5 .  FLUoxetine (PROZAC) 20 MG capsule, Take 3 capsules (60 mg total) by mouth daily., Disp: 270 capsule, Rfl: 1 .  lidocaine (LIDODERM) 5 %, Place 1 patch daily onto the skin. Remove & Discard patch within 12 hours or as directed by MD, Disp: 30 patch, Rfl: 11 .  olmesartan (BENICAR) 20 MG tablet, Take 1.5 tablets (30 mg total) by mouth daily., Disp: 135 tablet, Rfl: 1 .  omega-3 acid ethyl esters (LOVAZA) 1 g capsule, Take 2 capsules (2 g total) by mouth 2 (two) times daily., Disp: 360 capsule, Rfl: 3 .  predniSONE (DELTASONE) 5 MG tablet, Take 5 mg by mouth daily. , Disp: , Rfl:  .  tocilizumab (ACTEMRA) 400 MG/20ML SOLN injection, Infuse 480 mg into the vein once every 28 days., Disp: , Rfl:  .  tocilizumab 4 mg/kg in sodium chloride 0.9 %, Inject 280 mg into the vein., Disp: , Rfl:  .  traMADol (ULTRAM) 50 MG tablet, TAKE ONE TABLET BY MOUTH EVERY 6 HOURS AS NEEDED FOR MODERATE PAIN, Disp: 40 tablet, Rfl: 0 .  triamcinolone (KENALOG) 0.025 % ointment, Apply a very thin layer to affected area every 2 days., Disp: 30 g, Rfl: 0 .  omeprazole (PRILOSEC) 40 MG capsule, Take by mouth., Disp: , Rfl:   Allergies  Allergen Reactions  . Atorvastatin Other (See Comments)    Severe headache  . Statins   . Advair Diskus [Fluticasone-Salmeterol] Other (See Comments)    Mouth sores  . Sulfonamide Derivatives Rash    OBJECTIVE: BP 136/75   Pulse 86   Ht 5\' 4"  (1.626 m)   Wt 196 lb (88.9 kg)   SpO2 92%   BMI 33.64 kg/m  Gen: No acute distress. Nontoxic in appearance.  HENT: AT. [place].   MMM.  Eyes:Pupils Equal Round Reactive to light, Extraocular movements intact,  Conjunctiva without redness, discharge or icterus. CV: no edema Chest: Cough or shortness of breath not present.  Skin: no rashes, purpura or petechiae.  Neuro:  Alert. Oriented x3  Psych: Normal affect, dress and demeanor. Normal speech. Normal thought content and judgment. Depression screen [facility] 2/9 07/28/2018 01/13/2018 07/15/2017 07/15/2017 04/01/2017  Decreased Interest 0 - 0 0 0  Down, Depressed, Hopeless 1 1 0 0 0  PHQ - 2 Score 1 1 0 0 0  Altered sleeping 0 1 0 0 0  Tired, decreased energy 3 1 0 1 0  Change in appetite 0 0 0 0 0  Feeling bad or failure about yourself  1 0 0 0 0  Trouble concentrating 1 1 0 0 0  Moving slowly or fidgety/restless 0 0 0 0 0  Suicidal thoughts 0 0 0 0 0  PHQ-9  Score 6 4 0 1 0  Difficult doing work/chores Not difficult at all Somewhat difficult - - -   GAD 7 : Generalized Anxiety Score 07/28/2018 01/13/2018  Nervous, Anxious, on Edge 0 1  Control/stop worrying 0 0  Worry too much - different things 3 0  Trouble relaxing 3 1  Restless 0 1  Easily annoyed or irritable 0 1  Afraid - awful might happen 3 0  Total GAD 7 Score 9 4  Anxiety Difficulty Not difficult at all Not difficult at all     ASSESSMENT AND PLAN: [name] is a 85 y.o. female present for  Essential hypertension, benign/hyperlipidemia/morbid obesity - tocilizumab infusions and daily prednisone may be causing the increase in BP. - -Continue Benicar 30 mg daily.  Has been intolerant to higher doses of Benicar and multiple other blood pressure regimens.  Is tapering down on her prednisone, so hopefully her blood pressure also returns to normal prednisone is discontinued. - Low-sodium diet - Attempt to stay as active as possible - Continue baby aspirin - Intolerant to statins; could consider Lovaza  versus Zetia or WelChol.  Repeat lipids today. -TSH - Follow-up 6 months  Moderate persistent asthma  without complication Stable. Refills provided today.  Continue Symbicort 2 puffs twice a day.  Continue albuterol 1 to 2 puffs every 6 hours as needed.    Depression with anxiety Stable. Refills provided today on prozac 60 mg QD and valium.  Indication for chronic benzo: Valium- anxiety and lower back spasms Medication and dose: Valium 2.5 mg/5 mg # pills per month: #45 Last UDS date: next appt.  Pain contract signed (Y/N): Y Date narcotic database last reviewed (include red flags):07/28/18  -Follow-up 6 months  Dysuria/vulvitis:  - acute on chronic issue for her.  - continue triamcinolone cream with symptom onset. - will send for urine culture to make certain   > 25 minutes spent with patient, >50% of time spent face to face counseling       Howard Pouch, DO 07/31/2018

## 2018-07-31 ENCOUNTER — Encounter: Payer: Self-pay | Admitting: Family Medicine

## 2018-07-31 DIAGNOSIS — N762 Acute vulvitis: Secondary | ICD-10-CM | POA: Insufficient documentation

## 2018-08-06 DIAGNOSIS — M316 Other giant cell arteritis: Secondary | ICD-10-CM | POA: Diagnosis not present

## 2018-08-11 ENCOUNTER — Encounter: Payer: Self-pay | Admitting: Family Medicine

## 2018-09-01 ENCOUNTER — Other Ambulatory Visit: Payer: Self-pay

## 2018-09-02 ENCOUNTER — Ambulatory Visit: Payer: Medicare Other

## 2018-09-03 ENCOUNTER — Other Ambulatory Visit: Payer: Self-pay

## 2018-09-03 ENCOUNTER — Ambulatory Visit: Payer: Medicare Other | Admitting: Family Medicine

## 2018-09-03 DIAGNOSIS — R300 Dysuria: Secondary | ICD-10-CM | POA: Diagnosis not present

## 2018-09-03 DIAGNOSIS — M316 Other giant cell arteritis: Secondary | ICD-10-CM | POA: Diagnosis not present

## 2018-09-03 DIAGNOSIS — Z79899 Other long term (current) drug therapy: Secondary | ICD-10-CM | POA: Diagnosis not present

## 2018-09-04 LAB — URINE CULTURE
MICRO NUMBER:: 583844
Result:: NO GROWTH
SPECIMEN QUALITY:: ADEQUATE

## 2018-09-08 ENCOUNTER — Telehealth: Payer: Self-pay

## 2018-09-08 NOTE — Telephone Encounter (Signed)
Reason for CRM: Pts daughter called and is requesting urine culture. Pts daughter states pt has been using the topical cream from a previous visit because she is experiencing burning.  Gara Kroner - Daughter- [phone]  Called and spoke with daughter. Results were given to daughter, she verbalized understanding. Daughter stated she did not know what the next steps would be since she always has UTI symptoms. Daughter was told she should set up a virtual visit, if able, to discuss options/symptoms with Dr Raoul Pitch. Daughter was going to speak with patient and call back to make appt.

## 2018-09-09 NOTE — Telephone Encounter (Signed)
Patient's daughter left a VM on front desk for call back. Called her back. Patient is now having cognitive issues which she feels goes along with UTI's. Patient's daughter is asking if the topical cream she was using could affect the urine culture. Patient is also using Mucinex because she has become "stuffy" which she usually does when she has a UTI. [name] declined virtual visit. No appointments are available until 09/11/18.

## 2018-09-09 NOTE — Telephone Encounter (Signed)
If [name] is having cognitive issues she needs to be seen in person for a visit and we would need to do a further work-up and consider referral to specialist if indicated.  If she is having cognitive issues currently, it is not caused from a UTI-her culture 09/03/2018 was normal.   - If she had a UTI it would show on culture.  The cream she is using would not cause the culture to be incorrect or mask result in any way. If she continues to have urinary symptoms like she is, then we need to get her back to her gynecologist to further evaluate.  They can either make the appointment themselves or I would be happy to place the referral back to her gynecologist if they would like.

## 2018-09-10 NOTE — Telephone Encounter (Signed)
Pts daughter was called and scheduled for a in office visit. Daughter verbalized understanding to call GYN to address burning sensation patient is having

## 2018-09-14 ENCOUNTER — Telehealth: Payer: Self-pay | Admitting: *Deleted

## 2018-09-14 ENCOUNTER — Encounter: Payer: Self-pay | Admitting: Family Medicine

## 2018-09-14 ENCOUNTER — Other Ambulatory Visit: Payer: Self-pay

## 2018-09-14 ENCOUNTER — Telehealth: Payer: Self-pay

## 2018-09-14 ENCOUNTER — Ambulatory Visit (INDEPENDENT_AMBULATORY_CARE_PROVIDER_SITE_OTHER): Payer: Medicare Other | Admitting: Family Medicine

## 2018-09-14 VITALS — BP 179/86 | HR 76 | Temp 96.8°F | Resp 20 | Ht 64.0 in

## 2018-09-14 DIAGNOSIS — Z20822 Contact with and (suspected) exposure to covid-19: Secondary | ICD-10-CM

## 2018-09-14 DIAGNOSIS — R51 Headache: Secondary | ICD-10-CM | POA: Diagnosis not present

## 2018-09-14 DIAGNOSIS — Z7189 Other specified counseling: Secondary | ICD-10-CM

## 2018-09-14 DIAGNOSIS — J0100 Acute maxillary sinusitis, unspecified: Secondary | ICD-10-CM | POA: Diagnosis not present

## 2018-09-14 DIAGNOSIS — R519 Headache, unspecified: Secondary | ICD-10-CM

## 2018-09-14 MED ORDER — PREDNISONE 20 MG PO TABS: ORAL_TABLET | ORAL | 0 refills | Status: DC

## 2018-09-14 MED ORDER — AMOXICILLIN-POT CLAVULANATE 875-125 MG PO TABS: 1.0000 | ORAL_TABLET | Freq: Two times a day (BID) | ORAL | 0 refills | Status: DC

## 2018-09-14 NOTE — Telephone Encounter (Signed)
Pt scheduled for covid testing 09/15/18 @ 12:00. Daughter notified and Instructions given. Order placed.

## 2018-09-14 NOTE — Telephone Encounter (Signed)
Please advise them I am willing to perform a virtual visit- although, they do need to understand what we can accomplish virtually is limited.   I am unable to come to their car to perform a visit.

## 2018-09-14 NOTE — Telephone Encounter (Signed)
-----   Message from Caroll Rancher, LPN sent at 8/86/7737  2:19 PM EDT ----- Regarding: COVID 19 testing [name]  12/22/1929  MRN [id number]  Reason- headache, nasal congestion   Renee Kuneff DO

## 2018-09-14 NOTE — Progress Notes (Signed)
VIRTUAL VISIT VIA VIDEO  I connected with [name] on 09/14/18 at  1:00 PM EDT by a video enabled telemedicine application and verified that I am speaking with the correct person using two identifiers. Location patient: Home Location provider: [facility], Office Persons participating in the virtual visit: Patient, Dr. Raoul Pitch and R.Baker, LPN  I discussed the limitations of evaluation and management by telemedicine and the availability of in person appointments. The patient expressed understanding and agreed to proceed.  Patient Care Team    Relationship Specialty Notifications Start End  Ma Hillock, DO PCP - General Family Medicine  03/16/15   Hermelinda Medicus, MD  Internal Medicine  07/11/15    Comment: Rheumotology  Fay Records, MD Consulting Physician Cardiology  07/11/15   Lafayette Dragon, MD (Inactive)  Gastroenterology  07/11/15   Tanda Rockers, MD Consulting Physician Pulmonary Disease  07/11/15   Jessy Oto, MD Consulting Physician Orthopedic Surgery  07/12/16   Magnus Sinning, MD Consulting Physician Physical Medicine and Rehabilitation  07/12/16   [facility]    07/15/17   [facility]  07/15/17     SUBJECTIVE Chief Complaint  Patient presents with  . Nasal Congestion    Clear nasal drainage with headache since May. No fever. Took Mucinex this morning.     HPI: [name] is a 85 y.o. female present vis virtual visit with her daughter for sinus pain, congestion and headache. Her sinuses have been bothering her since May, but has worsened over the last week. She has severe discomfort reported in her maxillary sinus cavities and behind her eyes. She has nasal congestion and PND. There is no know covid 19 exposure. She is on immune modulators. She has been out 2x for infusions since March. Her family has been out in th community, but tries to take precautions with mask and hand washing before coming home. She denies fever, chills, nausea,  vomit, diarrhea,  cough or shortness of breath. Her family reported her fatigued and weak. She reports tolerating PO and eating/drinking her normal.  She has complained of UTI symptoms- however cultures have been normal. She does not desire Urology referral at this time.  They complained of memory deficits, but over last 2 days improved. They decline neurology referral or further eval at this time.   ROS: See pertinent positives and negatives per HPI.  Patient Active Problem List   Diagnosis Date Noted  . Acute vulvitis 07/31/2018  . Mixed stress and urge urinary incontinence 09/04/2017  . Elevated hemoglobin ([place]) 04/04/2017  . Chronic back pain 01/28/2017  . Morbid obesity ([place]) 01/28/2017  . Peptic ulcer disease   . Lumbar radiculitis   . Frequent falls 08/02/2016  . Hypertensive retinopathy of both eyes 09/07/2015  . Medicare annual wellness visit, subsequent 07/11/2015  . Long-term use of high-risk medication 07/11/2015  . Osteopenia 04/11/2015  . Depression with anxiety 04/11/2015  . Giant cell arteritis ([place]) 03/28/2015  . Polymyalgia rheumatica ([place]) 03/16/2015  . Chronic narcotic use 03/16/2015  . Anemia, iron deficiency 07/14/2013  . Asthma, moderate persistent 10/15/2012  . Hyperlipidemia 12/27/2008  . HYPERTENSION, BENIGN 12/27/2008    Social History   Tobacco Use  . Smoking status: Never Smoker  . Smokeless tobacco: Never Used  Substance Use Topics  . Alcohol use: No    Current Outpatient Medications:  .  albuterol (PROAIR HFA) 108 (90 Base) MCG/ACT inhaler, 2 puffs up to every 4 hours if can't  catch your breath, Disp: 3 Inhaler, Rfl: 3 .  aspirin 81 MG tablet, Take 81 mg by mouth daily.  , Disp: , Rfl:  .  budesonide-formoterol (SYMBICORT) 80-4.5 MCG/ACT inhaler, USE 2 INHALATIONS TWICE A DAY, Disp: 30.6 g, Rfl: 12 .  diazepam (VALIUM) 5 MG tablet, 1/2 tab PO in the morning and 1 tab QHS PRN., Disp: 45 tablet, Rfl: 5 .  FLUoxetine (PROZAC) 20 MG capsule, Take  3 capsules (60 mg total) by mouth daily., Disp: 270 capsule, Rfl: 1 .  lidocaine (LIDODERM) 5 %, Place 1 patch daily onto the skin. Remove & Discard patch within 12 hours or as directed by MD, Disp: 30 patch, Rfl: 11 .  olmesartan (BENICAR) 20 MG tablet, Take 1.5 tablets (30 mg total) by mouth daily., Disp: 135 tablet, Rfl: 1 .  omega-3 acid ethyl esters (LOVAZA) 1 g capsule, Take 2 capsules (2 g total) by mouth 2 (two) times daily., Disp: 360 capsule, Rfl: 3 .  omeprazole (PRILOSEC) 40 MG capsule, Take by mouth., Disp: , Rfl:  .  predniSONE (DELTASONE) 5 MG tablet, Take 5 mg by mouth daily. , Disp: , Rfl:  .  tocilizumab (ACTEMRA) 400 MG/20ML SOLN injection, Infuse 480 mg into the vein once every 28 days., Disp: , Rfl:  .  tocilizumab 4 mg/kg in sodium chloride 0.9 %, Inject 280 mg into the vein., Disp: , Rfl:  .  traMADol (ULTRAM) 50 MG tablet, TAKE ONE TABLET BY MOUTH EVERY 6 HOURS AS NEEDED FOR MODERATE PAIN, Disp: 40 tablet, Rfl: 0 .  triamcinolone (KENALOG) 0.025 % ointment, Apply a very thin layer to affected area every 2 days., Disp: 30 g, Rfl: 0  Allergies  Allergen Reactions  . Atorvastatin Other (See Comments)    Severe headache  . Statins   . Advair Diskus [Fluticasone-Salmeterol] Other (See Comments)    Mouth sores  . Sulfonamide Derivatives Rash    OBJECTIVE: BP (!) 179/86   Pulse 76   Temp (!) 96.8 F (36 C) (Oral)   Ht 5\' 4"  (1.626 m)   BMI 33.64 kg/m  Gen: No acute distress. Appears fatigued HENT: AT. [place].  MMM. Mild Puffiness around bilateral eyes present. Eyes:Pupils Equal Round Reactive to light, Extraocular movements intact,  Conjunctiva with mild redness, mild discharge, no icterus. CV: no edema Chest: Cough or shortness of breath not present.  Skin: no rashes, purpura or petechiae.  Neuro:  Alert. Oriented x3  Psych: Normal affect, dress and demeanor. Normal speech. Normal thought content and judgment.  ASSESSMENT AND PLAN: [name] is a 85 y.o.  female present for  Acute maxillary sinusitis, recurrence not specified/Nonintractable headache, unspecified chronicity pattern, unspecified headache type/Educated About Covid-19 Virus Infection Rest, hydrate.   mucinex (DM if cough), nettie pot or nasal saline.  Augmentin and prednisone prescribed, take until completed.   - amoxicillin-clavulanate (AUGMENTIN) 875-125 MG tablet; Take 1 tablet by mouth 2 (two) times daily.  Dispense: 20 tablet; Refill: 0 - Covid 19 testing ordered.  - F/U 1 week if not improving. Emergently if symptoms worsen.   They want to wait to pursue further evaluation on memory and urological concerns.  - urology referral declined. Urcx was negative 09/04/2018.  - labs and neurology referral declined.  - Agree to place on hold, but once she starts to feel better these conditions need to be properly evaluated. They agree.   > 25 minutes spent with patient, >50% of time spent face to face     [facility]  [place], DO 09/14/2018

## 2018-09-14 NOTE — Telephone Encounter (Signed)
Pts daughter called and stated she did not think she was going to be able to come to the office. First daughter stated her mom was to weak to come, I explained to daughter that if she was that weak she needed to go to ED. Pts daughter stated it is not that she is weak it is she is having sinus problems with drainage. Daughter asked if a antibiotic could be given that would cover that and urinary tract infection. Daughter was told she did not need a abx because she did not a UTI. Daughter stated her moms memory has been better the past couple of days. Daughter wants to know if she can be seen out at the car and began to talk about she could not come in due to [place]. Pts daughter agreed to virtual visit.   Please advise if virtual visit is okay or need to reschedule?

## 2018-09-14 NOTE — Telephone Encounter (Signed)
Pts daughter was told we could not discuss all the problems at one visit or on a VV, Pt agreed and would like to only address the nasal congestion at this visit.

## 2018-09-15 ENCOUNTER — Other Ambulatory Visit: Payer: Self-pay

## 2018-09-15 ENCOUNTER — Encounter: Payer: Self-pay | Admitting: Family Medicine

## 2018-09-15 DIAGNOSIS — Z20822 Contact with and (suspected) exposure to covid-19: Secondary | ICD-10-CM

## 2018-09-15 DIAGNOSIS — R6889 Other general symptoms and signs: Secondary | ICD-10-CM | POA: Diagnosis not present

## 2018-09-21 LAB — NOVEL CORONAVIRUS, NAA: SARS-CoV-2, NAA: NOT DETECTED

## 2018-09-29 DIAGNOSIS — Z961 Presence of intraocular lens: Secondary | ICD-10-CM | POA: Diagnosis not present

## 2018-09-29 DIAGNOSIS — H35033 Hypertensive retinopathy, bilateral: Secondary | ICD-10-CM | POA: Diagnosis not present

## 2018-09-29 DIAGNOSIS — H353132 Nonexudative age-related macular degeneration, bilateral, intermediate dry stage: Secondary | ICD-10-CM | POA: Diagnosis not present

## 2018-09-29 DIAGNOSIS — H04123 Dry eye syndrome of bilateral lacrimal glands: Secondary | ICD-10-CM | POA: Diagnosis not present

## 2018-10-06 DIAGNOSIS — M316 Other giant cell arteritis: Secondary | ICD-10-CM | POA: Diagnosis not present

## 2018-10-06 DIAGNOSIS — Z79899 Other long term (current) drug therapy: Secondary | ICD-10-CM | POA: Diagnosis not present

## 2018-10-06 DIAGNOSIS — M2550 Pain in unspecified joint: Secondary | ICD-10-CM | POA: Diagnosis not present

## 2018-10-19 ENCOUNTER — Other Ambulatory Visit: Payer: Self-pay

## 2018-10-29 DIAGNOSIS — R748 Abnormal levels of other serum enzymes: Secondary | ICD-10-CM | POA: Diagnosis not present

## 2018-10-29 DIAGNOSIS — M316 Other giant cell arteritis: Secondary | ICD-10-CM | POA: Diagnosis not present

## 2018-10-29 DIAGNOSIS — M2550 Pain in unspecified joint: Secondary | ICD-10-CM | POA: Diagnosis not present

## 2018-10-29 DIAGNOSIS — Z79899 Other long term (current) drug therapy: Secondary | ICD-10-CM | POA: Diagnosis not present

## 2018-11-05 DIAGNOSIS — M316 Other giant cell arteritis: Secondary | ICD-10-CM | POA: Diagnosis not present

## 2018-12-03 DIAGNOSIS — M316 Other giant cell arteritis: Secondary | ICD-10-CM | POA: Diagnosis not present

## 2018-12-17 ENCOUNTER — Other Ambulatory Visit: Payer: Self-pay | Admitting: Family Medicine

## 2018-12-17 DIAGNOSIS — I10 Essential (primary) hypertension: Secondary | ICD-10-CM

## 2018-12-22 ENCOUNTER — Other Ambulatory Visit: Payer: Self-pay

## 2018-12-22 ENCOUNTER — Ambulatory Visit (INDEPENDENT_AMBULATORY_CARE_PROVIDER_SITE_OTHER): Payer: Medicare Other

## 2018-12-22 DIAGNOSIS — Z23 Encounter for immunization: Secondary | ICD-10-CM

## 2018-12-24 ENCOUNTER — Encounter: Payer: Self-pay | Admitting: Gynecology

## 2019-01-05 ENCOUNTER — Other Ambulatory Visit: Payer: Self-pay

## 2019-01-05 ENCOUNTER — Emergency Department (HOSPITAL_BASED_OUTPATIENT_CLINIC_OR_DEPARTMENT_OTHER): Payer: Medicare Other

## 2019-01-05 ENCOUNTER — Encounter (HOSPITAL_BASED_OUTPATIENT_CLINIC_OR_DEPARTMENT_OTHER): Payer: Self-pay | Admitting: *Deleted

## 2019-01-05 ENCOUNTER — Emergency Department (HOSPITAL_BASED_OUTPATIENT_CLINIC_OR_DEPARTMENT_OTHER)
Admission: EM | Admit: 2019-01-05 | Discharge: 2019-01-05 | Disposition: A | Discharge status: Home / Self Care | Payer: Medicare Other | Attending: Emergency Medicine | Admitting: Emergency Medicine

## 2019-01-05 DIAGNOSIS — S82841A Displaced bimalleolar fracture of right lower leg, initial encounter for closed fracture: Secondary | ICD-10-CM

## 2019-01-05 DIAGNOSIS — R5381 Other malaise: Secondary | ICD-10-CM | POA: Diagnosis not present

## 2019-01-05 DIAGNOSIS — S99911A Unspecified injury of right ankle, initial encounter: Secondary | ICD-10-CM | POA: Diagnosis present

## 2019-01-05 DIAGNOSIS — S82844A Nondisplaced bimalleolar fracture of right lower leg, initial encounter for closed fracture: Secondary | ICD-10-CM | POA: Diagnosis not present

## 2019-01-05 DIAGNOSIS — Y999 Unspecified external cause status: Secondary | ICD-10-CM | POA: Insufficient documentation

## 2019-01-05 DIAGNOSIS — M353 Polymyalgia rheumatica: Secondary | ICD-10-CM | POA: Insufficient documentation

## 2019-01-05 DIAGNOSIS — I10 Essential (primary) hypertension: Secondary | ICD-10-CM | POA: Diagnosis not present

## 2019-01-05 DIAGNOSIS — Y9389 Activity, other specified: Secondary | ICD-10-CM | POA: Diagnosis not present

## 2019-01-05 DIAGNOSIS — M25571 Pain in right ankle and joints of right foot: Secondary | ICD-10-CM | POA: Diagnosis not present

## 2019-01-05 DIAGNOSIS — M545 Low back pain: Secondary | ICD-10-CM | POA: Diagnosis not present

## 2019-01-05 DIAGNOSIS — Y92091 Bathroom in other non-institutional residence as the place of occurrence of the external cause: Secondary | ICD-10-CM | POA: Insufficient documentation

## 2019-01-05 DIAGNOSIS — S3993XA Unspecified injury of pelvis, initial encounter: Secondary | ICD-10-CM | POA: Diagnosis not present

## 2019-01-05 DIAGNOSIS — W01198A Fall on same level from slipping, tripping and stumbling with subsequent striking against other object, initial encounter: Secondary | ICD-10-CM | POA: Diagnosis not present

## 2019-01-05 DIAGNOSIS — R52 Pain, unspecified: Secondary | ICD-10-CM | POA: Diagnosis not present

## 2019-01-05 DIAGNOSIS — W19XXXA Unspecified fall, initial encounter: Secondary | ICD-10-CM | POA: Diagnosis not present

## 2019-01-05 MED ORDER — OXYCODONE HCL 5 MG PO TABS: 5.0000 mg | ORAL_TABLET | ORAL | 0 refills | Status: DC | PRN

## 2019-01-05 MED ORDER — TRAMADOL HCL 50 MG PO TABS: 50.0000 mg | ORAL_TABLET | Freq: Once | ORAL | Status: DC

## 2019-01-05 NOTE — ED Triage Notes (Signed)
Fell Sunday night at home.  Had an appointment for infusion today but MD cancelled due to patient in excruciating pain.  Can not put any weight on her right leg.

## 2019-01-05 NOTE — ED Provider Notes (Signed)
[facility] Emergency Department Provider Note MRN:  [id number]  Arrival date & time: 01/05/19     Chief Complaint   Fall   History of Present Illness   [name] is a 85 y.o. year-old female with a history of polymyalgia rheumatica presenting to the ED with chief complaint of fall.  Patient fell 2 days ago.  Was trying to ambulate using her walker and thinks that she lost balance.  Denies syncope, no chest pain, no shortness of breath, no dizziness preceding the fall.  Fell awkwardly on her right leg.  Has been largely unable to walk or transition to the commode since the fall.  Has been refusing visit to the ED despite her family asking her to be evaluated.  Here today because she was due for her infusion of Actemra but her doctors would not provide her the infusion unless she went to the emergency department to have her leg evaluated.  She denies head trauma, no loss of consciousness, no nausea, no vomiting, no chest pain, no shortness of breath, no abdominal pain, endorsing mild bilateral hip pain, moderate to severe right ankle and foot pain.  Worse with palpation.  Review of Systems  A complete 10 system review of systems was obtained and all systems are negative except as noted in the HPI and PMH.   Patient's Health History    Past Medical History:  Diagnosis Date  . Abdominal bloating   . Abnormal LFTs (liver function tests)   . Anxiety disorder   . Arthritis   . Asthma   . Depression   . Diverticulosis   . Edema   . Fatty liver   . Fibromyalgia   . GERD (gastroesophageal reflux disease)   . Giant cell arteritis (HCC)    No biopsy secondary to steroid use  . Hx of adenomatous colonic polyps   . Hyperlipidemia   . Hypertension   . IBS (irritable bowel syndrome)   . Lumbar radiculitis   . Osteoporosis 09/2017   T score -2.6 left femoral neck  . Peptic ulcer disease   . Skin cancer    shoulder, leg, right eye (? squamous by resection)   . Thoracic radiculitis   . Thyroid nodule    "removed"  . Urinary incontinence   . Vertigo     Past Surgical History:  Procedure Laterality Date  . ABDOMINAL HYSTERECTOMY    . BACK SURGERY  2005   lower back  . EYE SURGERY Right 2006  . LIVER BIOPSY     fatty liver  . NASAL SEPTUM SURGERY    . right knee surgery  2006   arthroscopy  . right leg skin cancer surgery  2014  . right shoulder skin cancer excision  2012  . THYROIDECTOMY      Family History  Problem Relation Age of Onset  . Colon polyps Maternal Aunt   . Diabetes Father   . Heart disease Father   . Heart disease Mother   . Lung disease Mother   . COPD Mother   . Lung cancer Brother   . Cancer Brother        lung  . COPD Brother   . Stroke Brother   . Lung cancer Sister   . COPD Sister   . Kidney disease Other        niece-mat side  . Arthritis Other   . Diabetes Son   . Arthritis Daughter   . Colon cancer Neg Hx  Social History   Socioeconomic History  . Marital status: Unknown    Spouse name: Not on file  . Number of children: 3  . Years of education: Not on file  . Highest education level: Not on file  Occupational History  . Occupation: Retired Water engineer  Social Needs  . Financial resource strain: Not on file  . Food insecurity    Worry: Not on file    Inability: Not on file  . Transportation needs    Medical: Not on file    Non-medical: Not on file  Tobacco Use  . Smoking status: Never Smoker  . Smokeless tobacco: Never Used  Substance and Sexual Activity  . Alcohol use: No  . Drug use: No  . Sexual activity: Not Currently    Comment: intercourser age 26, less than 20 secxual partners  Lifestyle  . Physical activity    Days per week: Not on file    Minutes per session: Not on file  . Stress: Not on file  Relationships  . Social Herbalist on phone: Not on file    Gets together: Not on file    Attends religious service: Not on file    Active member  of club or organization: Not on file    Attends meetings of clubs or organizations: Not on file    Relationship status: Not on file  . Intimate partner violence    Fear of current or ex partner: Not on file    Emotionally abused: Not on file    Physically abused: Not on file    Forced sexual activity: Not on file  Other Topics Concern  . Not on file  Social History Narrative   Married, husband Armed forces training and education officer.    Lives with [name] and [name].   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   Patient takes a daily vitamin   She wears her seat belt. Exercises greater than 3 times a week.   Wears 2 hearing aids. No dentures.   "Sometimes "requires assistive device for walking, either a walker or wheelchair.   There is a smoke detector in her home. There are firearms in her home. She feels safe in her relationships.     Physical Exam  Vital Signs and Nursing Notes reviewed Vitals:   01/05/19 1022  BP: (!) 177/70  Pulse: 74  Resp: 16  Temp: 98.9 F (37.2 C)  SpO2: 96%    CONSTITUTIONAL: Well-appearing, NAD NEURO:  Alert and oriented x 3, no focal deficits EYES:  eyes equal and reactive ENT/NECK:  no LAD, no JVD CARDIO: Regular rate, well-perfused, normal S1 and S2 PULM:  CTAB no wheezing or rhonchi GI/GU:  normal bowel sounds, non-distended, non-tender MSK/SPINE:  No gross deformities, no edema SKIN:  no rash; bruising to the right foot and right lower leg, no tenderness or pain with range of motion of the right knee or hip. PSYCH:  Appropriate speech and behavior  Diagnostic and Interventional Summary    Labs Reviewed - No data to display  DG Pelvis 1-2 Views  Final Result    DG Lumbar Spine Complete  Final Result    DG Tibia/Fibula Right  Final Result    DG Ankle Complete Right  Final Result    DG Foot Complete Right  Final Result      Medications  traMADol (ULTRAM) tablet 50 mg (50 mg Oral Not Given 01/05/19 1157)     Procedures Critical Care  ED Course  and  Medical Decision Making  I have reviewed the triage vital signs and the nursing notes.  Pertinent labs & imaging results that were available during my care of the patient were reviewed by me and considered in my medical decision making (see below for details).  X-ray to exclude fracture, fall seems to be mechanical in nature, reassuring neurological exam, no other complaints.  Imaging reveals bimalleolar fracture, no other injuries.  Neurovascularly intact distal to this injury, strong peripheral pulse.  Discussed with Dr. Erlinda Hong, who would like to see the patient in clinic tomorrow afternoon.  Placed in a stirrup and posterior splint, case management consulted to aid with home ambulation status, patient and patient's daughter are comfortable with discharge plan.  Barth Kirks. [place]onia Small, [facility] [email or web address]  Final Clinical Impressions(s) / ED Diagnoses     ICD-10-CM   1. Closed bimalleolar fracture of right ankle, initial encounter  S82.841A     ED Discharge Orders         Ordered    oxyCODONE (ROXICODONE) 5 MG immediate release tablet  Every 4 hours PRN     01/05/19 1328          Discharge Instructions Discussed with and Provided to Patient:   Discharge Instructions     You were evaluated in the Emergency Department and after careful evaluation, we did not find any emergent condition requiring admission or further testing in the hospital.  Your ankle is broken in 2 places.  We discussed your injury with the orthopedic specialists.  They are expecting you in the office tomorrow afternoon.  Please call the office provided tomorrow morning to schedule this appointment for a specific time.  Please keep the splint clean and dry and use the oxycodone medication as needed as we discussed.  Please return to the Emergency Department if you experience any worsening of your condition.  We encourage you to follow up with a primary care  provider.  Thank you for allowing [place] to be a part of your care.        Maudie Flakes, MD 01/05/19 1330

## 2019-01-05 NOTE — TOC Initial Note (Signed)
Transition of Care [facility]) - Initial/Assessment Note    Patient Details  Name: [name] MRN: [id number] Date of Birth: Jan 29, 1930  [facility] CM/SW Contact:    Erenest Rasher, RN Phone Number: [phone] 01/05/2019, 1:58 PM  Clinical Narrative:                 Spoke to pt's daughter, [name] at bedside. Offered choice for [facility]. Pt's daughter is agreeable to [facility]. Pt declines SNF. She had RW, wheelchair, and bedside commode. Contacted [facility] with new referral. [place] for hoyer lift and 3n1 bedside commode to be delivered to home.   Expected Discharge Plan: [place] Barriers to Discharge: No Barriers Identified   Patient Goals and CMS Choice Patient states their goals for this hospitalization and ongoing recovery are:: wants to remain at home CMS Medicare.gov Compare Post Acute Care list provided to:: Patient Represenative (must comment)(daughter, Gara Kroner) Choice offered to / list presented to : Adult Children  Expected Discharge Plan and Services Expected Discharge Plan: [place]   Discharge Planning Services: CM Consult Post Acute Care Choice: [place] arrangements for the past 2 months: Single Family Home                 DME Arranged: 3-N-1, Other see comment(Hoyer Lift) DME Agency: [employer] Date DME Agency Contacted: 01/05/19 Time DME Agency Contacted: [id number] Representative spoke with at DME Agency: Dawayne Patricia HH Arranged: RN, PT, OT, Nurse's Aide, Social Work [employer] Agency: [place] Date [place]: 01/05/19 Time [place]: [id number] Representative spoke with at [place]: Adela Lank  Prior Living Arrangements/Services Living arrangements for the past 2 months: [place] with:: Adult Children Patient language and need for interpreter reviewed:: Yes Do you feel safe going back to the place where you  live?: Yes      Need for Family Participation in Patient Care: Yes (Comment) Care giver support system in place?: Yes (comment) Current home services: DME(rolling walker, wheelchair, bedside commode) Criminal Activity/Legal Involvement Pertinent to Current Situation/Hospitalization: No - Comment as needed  Activities of Daily Living      Permission Sought/Granted Permission sought to share information with : Case Manager, Family Supports, PCP Permission granted to share information with : Yes, Verbal Permission Granted  Share Information with NAME: Gara Kroner  Permission granted to share info w AGENCY: [facility]  Permission granted to share info w Relationship: daughter  Permission granted to share info w Contact Information: [phone]  Emotional Assessment       Orientation: : Oriented to Self, Oriented to Place, Oriented to  Time   Psych Involvement: No (comment)  Admission diagnosis:  fall Patient Active Problem List   Diagnosis Date Noted  . Acute vulvitis 07/31/2018  . Mixed stress and urge urinary incontinence 09/04/2017  . Elevated hemoglobin ([place]) 04/04/2017  . Chronic back pain 01/28/2017  . Morbid obesity ([place]) 01/28/2017  . Peptic ulcer disease   . Lumbar radiculitis   . Frequent falls 08/02/2016  . Hypertensive retinopathy of both eyes 09/07/2015  . Medicare annual wellness visit, subsequent 07/11/2015  . Long-term use of high-risk medication 07/11/2015  . Osteopenia 04/11/2015  . Depression with anxiety 04/11/2015  . Giant cell arteritis ([place]) 03/28/2015  . Polymyalgia rheumatica ([place]) 03/16/2015  . Chronic narcotic use 03/16/2015  . Anemia, iron deficiency 07/14/2013  . Asthma,  moderate persistent 10/15/2012  . Hyperlipidemia 12/27/2008  . HYPERTENSION, BENIGN 12/27/2008   PCP:  Ma Hillock, DO Pharmacy:   [facility], [place] - [address] [place] [address] Phone: [phone] Fax:  [phone]  [facility] [place], [place] [address] [place] [address] Phone: [phone] Fax: [place], [place] - [address] [place] [address] Phone: [phone] Fax: [phone]  [id number] - [place], [place] [address] [place] [address] Phone: [phone] Fax: [phone]     Social Determinants of Health (SDOH) Interventions    Readmission Risk Interventions No flowsheet data found.

## 2019-01-05 NOTE — ED Notes (Signed)
ED Provider at bedside. 

## 2019-01-05 NOTE — Discharge Instructions (Addendum)
You were evaluated in the Emergency Department and after careful evaluation, we did not find any emergent condition requiring admission or further testing in the hospital.  Your ankle is broken in 2 places.  We discussed your injury with the orthopedic specialists.  They are expecting you in the office tomorrow afternoon.  Please call the office provided tomorrow morning to schedule this appointment for a specific time.  Please keep the splint clean and dry and use the oxycodone medication as needed as we discussed.  Please return to the Emergency Department if you experience any worsening of your condition.  We encourage you to follow up with a primary care provider.  Thank you for allowing [place] to be a part of your care.

## 2019-01-06 ENCOUNTER — Other Ambulatory Visit: Payer: Self-pay

## 2019-01-06 ENCOUNTER — Ambulatory Visit (INDEPENDENT_AMBULATORY_CARE_PROVIDER_SITE_OTHER): Payer: Medicare Other | Admitting: Orthopaedic Surgery

## 2019-01-06 ENCOUNTER — Encounter: Payer: Self-pay | Admitting: Orthopaedic Surgery

## 2019-01-06 ENCOUNTER — Encounter (HOSPITAL_BASED_OUTPATIENT_CLINIC_OR_DEPARTMENT_OTHER): Payer: Self-pay | Admitting: *Deleted

## 2019-01-06 DIAGNOSIS — S82841A Displaced bimalleolar fracture of right lower leg, initial encounter for closed fracture: Secondary | ICD-10-CM

## 2019-01-06 HISTORY — DX: Displaced bimalleolar fracture of right lower leg, initial encounter for closed fracture: S82.841A

## 2019-01-06 MED ORDER — HYDROCODONE-ACETAMINOPHEN 5-325 MG PO TABS: 1.0000 | ORAL_TABLET | Freq: Every day | ORAL | 0 refills | Status: DC | PRN

## 2019-01-06 NOTE — Progress Notes (Signed)
Office Visit Note   Patient: [name]           Date of Birth: 07/15/1929           MRN: [id number] Visit Date: 01/06/2019              Requested by: Ma Hillock, DO [address] [place],  [place] [address] PCP: Ma Hillock, DO   Assessment & Plan: Visit Diagnoses:  1. Bimalleolar ankle fracture, right, closed, initial encounter     Plan: Impression is displaced right bimalleolar ankle fracture.  Recommendation is for surgical stabilization within a week.  In the meantime she will elevate at all times to allow the swelling to improve.  Splint was reapplied today.  Details of the surgery including risks and possible complications discussed.  Rehab also discussed today.  Questions encouraged and answered.  Follow-Up Instructions: Return for 2 week opstop visit.   Orders:  No orders of the defined types were placed in this encounter.  Meds ordered this encounter  Medications  . HYDROcodone-acetaminophen (NORCO) 5-325 MG tablet    Sig: Take 1-2 tablets by mouth daily as needed.    Dispense:  20 tablet    Refill:  0      Procedures: No procedures performed   Clinical Data: No additional findings.   Subjective: Chief Complaint  Patient presents with  . Right Ankle - Pain    [name] is an 85 year old female who sustained a right bimalleolar ankle fracture this past Sunday at home when she fell.  She is unsure if she had syncope.  She finally went to the ER yesterday and x-rays demonstrated a displaced bimalleolar ankle fracture.  She follows up today.  She denies any numbness and tingling.  She is unable to tolerate side effects of Percocet.   Review of Systems  Constitutional: Negative.   HENT: Negative.   Eyes: Negative.   Respiratory: Negative.   Cardiovascular: Negative.   Endocrine: Negative.   Musculoskeletal: Negative.   Neurological: Negative.   Hematological: Negative.   Psychiatric/Behavioral: Negative.   All other systems reviewed and are  negative.    Objective: Vital Signs: There were no vitals taken for this visit.  Physical Exam Vitals signs and nursing note reviewed.  Constitutional:      Appearance: She is well-developed.  HENT:     Head: Normocephalic and atraumatic.  Neck:     Musculoskeletal: Neck supple.  Pulmonary:     Effort: Pulmonary effort is normal.  Abdominal:     Palpations: Abdomen is soft.  Skin:    General: Skin is warm.     Capillary Refill: Capillary refill takes less than 2 seconds.  Neurological:     Mental Status: She is alert and oriented to person, place, and time.  Psychiatric:        Behavior: Behavior normal.        Thought Content: Thought content normal.        Judgment: Judgment normal.     Ortho Exam Right ankle exam shows moderate swelling and bruising.  No neurovascular compromise.  Tender to palpation over the fracture sites. Specialty Comments:  No specialty comments available.  Imaging: No results found.   PMFS History: Patient Active Problem List   Diagnosis Date Noted  . Bimalleolar ankle fracture, right, closed, initial encounter 01/06/2019  . Acute vulvitis 07/31/2018  . Mixed stress and urge urinary incontinence 09/04/2017  . Elevated hemoglobin ([place]) 04/04/2017  . Chronic back pain  01/28/2017  . Morbid obesity ([place]) 01/28/2017  . Peptic ulcer disease   . Lumbar radiculitis   . Frequent falls 08/02/2016  . Hypertensive retinopathy of both eyes 09/07/2015  . Medicare annual wellness visit, subsequent 07/11/2015  . Long-term use of high-risk medication 07/11/2015  . Osteopenia 04/11/2015  . Depression with anxiety 04/11/2015  . Giant cell arteritis ([place]) 03/28/2015  . Polymyalgia rheumatica ([place]) 03/16/2015  . Chronic narcotic use 03/16/2015  . Anemia, iron deficiency 07/14/2013  . Asthma, moderate persistent 10/15/2012  . Hyperlipidemia 12/27/2008  . HYPERTENSION, BENIGN 12/27/2008   Past Medical History:  Diagnosis Date  . Abdominal bloating    . Abnormal LFTs (liver function tests)   . Anxiety disorder   . Arthritis   . Asthma   . Depression   . Diverticulosis   . Edema   . Fatty liver   . Fibromyalgia   . GERD (gastroesophageal reflux disease)   . Giant cell arteritis ([facility])    No biopsy secondary to steroid use  . Hx of adenomatous colonic polyps   . Hyperlipidemia   . Hypertension   . IBS (irritable bowel syndrome)   . Lumbar radiculitis   . Osteoporosis 09/2017   T score -2.6 left femoral neck  . Peptic ulcer disease   . Skin cancer    shoulder, leg, right eye (? squamous by resection)  . Thoracic radiculitis   . Thyroid nodule    "removed"  . Urinary incontinence   . Vertigo     Family History  Problem Relation Age of Onset  . Colon polyps Maternal Aunt   . Diabetes Father   . Heart disease Father   . Heart disease Mother   . Lung disease Mother   . COPD Mother   . Lung cancer Brother   . Cancer Brother        lung  . COPD Brother   . Stroke Brother   . Lung cancer Sister   . COPD Sister   . Kidney disease Other        niece-mat side  . Arthritis Other   . Diabetes Son   . Arthritis Daughter   . Colon cancer Neg Hx     Past Surgical History:  Procedure Laterality Date  . ABDOMINAL HYSTERECTOMY    . BACK SURGERY  2005   lower back  . EYE SURGERY Right 2006  . LIVER BIOPSY     fatty liver  . NASAL SEPTUM SURGERY    . right knee surgery  2006   arthroscopy  . right leg skin cancer surgery  2014  . right shoulder skin cancer excision  2012  . THYROIDECTOMY     Social History   Occupational History  . Occupation: Retired Water engineer  Tobacco Use  . Smoking status: Never Smoker  . Smokeless tobacco: Never Used  Substance and Sexual Activity  . Alcohol use: No  . Drug use: No  . Sexual activity: Not Currently    Comment: intercourser age 85, less than 5 secxual partners

## 2019-01-07 ENCOUNTER — Telehealth: Payer: Self-pay | Admitting: Orthopaedic Surgery

## 2019-01-07 ENCOUNTER — Other Ambulatory Visit (HOSPITAL_COMMUNITY)
Admission: RE | Admit: 2019-01-07 | Discharge: 2019-01-07 | Disposition: A | Discharge status: Home / Self Care | Payer: Medicare Other | Source: Ambulatory Visit | Attending: Orthopaedic Surgery | Admitting: Orthopaedic Surgery

## 2019-01-07 DIAGNOSIS — Z20828 Contact with and (suspected) exposure to other viral communicable diseases: Secondary | ICD-10-CM | POA: Insufficient documentation

## 2019-01-07 DIAGNOSIS — Z01812 Encounter for preprocedural laboratory examination: Secondary | ICD-10-CM | POA: Insufficient documentation

## 2019-01-07 NOTE — Telephone Encounter (Signed)
FAXED

## 2019-01-07 NOTE — Telephone Encounter (Signed)
Patient's daughter called stating that [facility] has not received the RX for the hospital bed.  She stated that you can fax it to her and that she will take it to [facility].  Her fax number is [phone].  CB#[phone].  Thank you.

## 2019-01-07 NOTE — Telephone Encounter (Signed)
SEE OTHER MESSAGES

## 2019-01-07 NOTE — Telephone Encounter (Signed)
Patient's daughter called stating that [facility] has not received the RX for the hospital bed.  She stated that you can fax it to her and that she will take it to [facility].  Her fax number is [phone].  CB#[phone].  Thank you.

## 2019-01-10 LAB — NOVEL CORONAVIRUS, NAA (HOSP ORDER, SEND-OUT TO REF LAB; TAT 18-24 HRS): SARS-CoV-2, NAA: NOT DETECTED

## 2019-01-11 ENCOUNTER — Encounter (HOSPITAL_BASED_OUTPATIENT_CLINIC_OR_DEPARTMENT_OTHER): Payer: Self-pay | Admitting: Emergency Medicine

## 2019-01-11 ENCOUNTER — Ambulatory Visit (HOSPITAL_BASED_OUTPATIENT_CLINIC_OR_DEPARTMENT_OTHER)
Admission: RE | Admit: 2019-01-11 | Discharge: 2019-01-11 | Disposition: A | Discharge status: Home / Self Care | Payer: Medicare Other | Attending: Orthopaedic Surgery | Admitting: Orthopaedic Surgery

## 2019-01-11 ENCOUNTER — Other Ambulatory Visit: Payer: Self-pay

## 2019-01-11 ENCOUNTER — Encounter (HOSPITAL_BASED_OUTPATIENT_CLINIC_OR_DEPARTMENT_OTHER)
Admission: RE | Disposition: A | Discharge status: Home / Self Care | Payer: Self-pay | Source: Home / Self Care | Attending: Orthopaedic Surgery

## 2019-01-11 ENCOUNTER — Ambulatory Visit (HOSPITAL_BASED_OUTPATIENT_CLINIC_OR_DEPARTMENT_OTHER): Payer: Medicare Other | Admitting: Anesthesiology

## 2019-01-11 ENCOUNTER — Ambulatory Visit (HOSPITAL_COMMUNITY): Payer: Medicare Other

## 2019-01-11 DIAGNOSIS — R9431 Abnormal electrocardiogram [ECG] [EKG]: Secondary | ICD-10-CM | POA: Diagnosis not present

## 2019-01-11 DIAGNOSIS — M1990 Unspecified osteoarthritis, unspecified site: Secondary | ICD-10-CM | POA: Diagnosis not present

## 2019-01-11 DIAGNOSIS — F419 Anxiety disorder, unspecified: Secondary | ICD-10-CM | POA: Insufficient documentation

## 2019-01-11 DIAGNOSIS — J4540 Moderate persistent asthma, uncomplicated: Secondary | ICD-10-CM | POA: Diagnosis not present

## 2019-01-11 DIAGNOSIS — E669 Obesity, unspecified: Secondary | ICD-10-CM | POA: Diagnosis not present

## 2019-01-11 DIAGNOSIS — E785 Hyperlipidemia, unspecified: Secondary | ICD-10-CM | POA: Diagnosis not present

## 2019-01-11 DIAGNOSIS — K760 Fatty (change of) liver, not elsewhere classified: Secondary | ICD-10-CM | POA: Diagnosis not present

## 2019-01-11 DIAGNOSIS — K219 Gastro-esophageal reflux disease without esophagitis: Secondary | ICD-10-CM | POA: Insufficient documentation

## 2019-01-11 DIAGNOSIS — Z8249 Family history of ischemic heart disease and other diseases of the circulatory system: Secondary | ICD-10-CM | POA: Insufficient documentation

## 2019-01-11 DIAGNOSIS — I10 Essential (primary) hypertension: Secondary | ICD-10-CM | POA: Insufficient documentation

## 2019-01-11 DIAGNOSIS — X58XXXA Exposure to other specified factors, initial encounter: Secondary | ICD-10-CM | POA: Diagnosis not present

## 2019-01-11 DIAGNOSIS — Z86010 Personal history of colonic polyps: Secondary | ICD-10-CM | POA: Insufficient documentation

## 2019-01-11 DIAGNOSIS — Z7951 Long term (current) use of inhaled steroids: Secondary | ICD-10-CM | POA: Insufficient documentation

## 2019-01-11 DIAGNOSIS — S82841D Displaced bimalleolar fracture of right lower leg, subsequent encounter for closed fracture with routine healing: Secondary | ICD-10-CM | POA: Diagnosis not present

## 2019-01-11 DIAGNOSIS — M8580 Other specified disorders of bone density and structure, unspecified site: Secondary | ICD-10-CM | POA: Insufficient documentation

## 2019-01-11 DIAGNOSIS — Z7982 Long term (current) use of aspirin: Secondary | ICD-10-CM | POA: Insufficient documentation

## 2019-01-11 DIAGNOSIS — K589 Irritable bowel syndrome without diarrhea: Secondary | ICD-10-CM | POA: Diagnosis not present

## 2019-01-11 DIAGNOSIS — F329 Major depressive disorder, single episode, unspecified: Secondary | ICD-10-CM | POA: Insufficient documentation

## 2019-01-11 DIAGNOSIS — S82841A Displaced bimalleolar fracture of right lower leg, initial encounter for closed fracture: Secondary | ICD-10-CM | POA: Insufficient documentation

## 2019-01-11 DIAGNOSIS — Z6831 Body mass index (BMI) 31.0-31.9, adult: Secondary | ICD-10-CM | POA: Diagnosis not present

## 2019-01-11 DIAGNOSIS — Z4789 Encounter for other orthopedic aftercare: Secondary | ICD-10-CM

## 2019-01-11 DIAGNOSIS — J45909 Unspecified asthma, uncomplicated: Secondary | ICD-10-CM | POA: Diagnosis not present

## 2019-01-11 DIAGNOSIS — M797 Fibromyalgia: Secondary | ICD-10-CM | POA: Diagnosis not present

## 2019-01-11 DIAGNOSIS — Z85828 Personal history of other malignant neoplasm of skin: Secondary | ICD-10-CM | POA: Insufficient documentation

## 2019-01-11 DIAGNOSIS — Z7952 Long term (current) use of systemic steroids: Secondary | ICD-10-CM | POA: Diagnosis not present

## 2019-01-11 DIAGNOSIS — Z801 Family history of malignant neoplasm of trachea, bronchus and lung: Secondary | ICD-10-CM | POA: Diagnosis not present

## 2019-01-11 DIAGNOSIS — Z79899 Other long term (current) drug therapy: Secondary | ICD-10-CM | POA: Insufficient documentation

## 2019-01-11 DIAGNOSIS — G8918 Other acute postprocedural pain: Secondary | ICD-10-CM | POA: Diagnosis not present

## 2019-01-11 HISTORY — DX: Displaced bimalleolar fracture of right lower leg, initial encounter for closed fracture: S82.841A

## 2019-01-11 HISTORY — PX: ORIF ANKLE FRACTURE: SHX5408

## 2019-01-11 LAB — BASIC METABOLIC PANEL
Anion gap: 11 (ref 5–15)
BUN: 13 mg/dL (ref 8–23)
CO2: 25 mmol/L (ref 22–32)
Calcium: 9.5 mg/dL (ref 8.9–10.3)
Chloride: 105 mmol/L (ref 98–111)
Creatinine, Ser: 0.99 mg/dL (ref 0.44–1.00)
GFR calc Af Amer: 59 mL/min — ABNORMAL LOW (ref >60)
GFR calc non Af Amer: 51 mL/min — ABNORMAL LOW (ref >60)
Glucose, Bld: 122 mg/dL — ABNORMAL HIGH (ref 70–99)
Potassium: 3.8 mmol/L (ref 3.5–5.1)
Sodium: 141 mmol/L (ref 135–145)

## 2019-01-11 SURGERY — OPEN REDUCTION INTERNAL FIXATION (ORIF) ANKLE FRACTURE
Anesthesia: Regional | Site: Ankle | Laterality: Right

## 2019-01-11 MED ORDER — CEFAZOLIN SODIUM-DEXTROSE 2-4 GM/100ML-% IV SOLN
INTRAVENOUS | Status: AC
  Filled 2019-01-11: qty 100

## 2019-01-11 MED ORDER — LIDOCAINE 2% (20 MG/ML) 5 ML SYRINGE
INTRAMUSCULAR | Status: AC
  Filled 2019-01-11: qty 5

## 2019-01-11 MED ORDER — DEXAMETHASONE SODIUM PHOSPHATE 10 MG/ML IJ SOLN
INTRAMUSCULAR | Status: DC | PRN
  Administered 2019-01-11: 10 mg via INTRAVENOUS

## 2019-01-11 MED ORDER — LIDOCAINE 2% (20 MG/ML) 5 ML SYRINGE
INTRAMUSCULAR | Status: DC | PRN
  Administered 2019-01-11: 40 mg via INTRAVENOUS

## 2019-01-11 MED ORDER — ACETAMINOPHEN 500 MG PO TABS
1000.0000 mg | ORAL_TABLET | Freq: Once | ORAL | Status: AC
  Administered 2019-01-11: 1000 mg via ORAL

## 2019-01-11 MED ORDER — OXYCODONE-ACETAMINOPHEN 5-325 MG PO TABS: 1.0000 | ORAL_TABLET | Freq: Three times a day (TID) | ORAL | 0 refills | Status: DC | PRN

## 2019-01-11 MED ORDER — ROPIVACAINE HCL 5 MG/ML IJ SOLN
INTRAMUSCULAR | Status: DC | PRN
  Administered 2019-01-11: 15 mL via PERINEURAL
  Administered 2019-01-11: 20 mL via PERINEURAL

## 2019-01-11 MED ORDER — ONDANSETRON HCL 4 MG/2ML IJ SOLN: 4.0000 mg | Freq: Once | INTRAMUSCULAR | Status: DC | PRN

## 2019-01-11 MED ORDER — CHLORHEXIDINE GLUCONATE 4 % EX LIQD: 60.0000 mL | Freq: Once | CUTANEOUS | Status: DC

## 2019-01-11 MED ORDER — FENTANYL CITRATE (PF) 100 MCG/2ML IJ SOLN
INTRAMUSCULAR | Status: DC | PRN
  Administered 2019-01-11 (×4): 25 ug via INTRAVENOUS

## 2019-01-11 MED ORDER — ACETAMINOPHEN 10 MG/ML IV SOLN: 1000.0000 mg | Freq: Once | INTRAVENOUS | Status: DC | PRN

## 2019-01-11 MED ORDER — ASPIRIN EC 81 MG PO TBEC: 81.0000 mg | DELAYED_RELEASE_TABLET | Freq: Two times a day (BID) | ORAL | 0 refills | Status: DC

## 2019-01-11 MED ORDER — CALCIUM CARBONATE-VITAMIN D 500-200 MG-UNIT PO TABS: 1.0000 | ORAL_TABLET | Freq: Three times a day (TID) | ORAL | 6 refills | Status: DC

## 2019-01-11 MED ORDER — ONDANSETRON HCL 4 MG PO TABS: 4.0000 mg | ORAL_TABLET | Freq: Three times a day (TID) | ORAL | 0 refills | Status: DC | PRN

## 2019-01-11 MED ORDER — FENTANYL CITRATE (PF) 100 MCG/2ML IJ SOLN
25.0000 ug | INTRAMUSCULAR | Status: DC | PRN
  Administered 2019-01-11 (×2): 25 ug via INTRAVENOUS

## 2019-01-11 MED ORDER — ONDANSETRON HCL 4 MG/2ML IJ SOLN
INTRAMUSCULAR | Status: AC
  Filled 2019-01-11: qty 2

## 2019-01-11 MED ORDER — ZINC SULFATE 220 (50 ZN) MG PO CAPS: 220.0000 mg | ORAL_CAPSULE | Freq: Every day | ORAL | 0 refills | Status: DC

## 2019-01-11 MED ORDER — PROPOFOL 10 MG/ML IV BOLUS
INTRAVENOUS | Status: AC
  Filled 2019-01-11: qty 20

## 2019-01-11 MED ORDER — LACTATED RINGERS IV SOLN: INTRAVENOUS | Status: DC

## 2019-01-11 MED ORDER — EPHEDRINE 5 MG/ML INJ
INTRAVENOUS | Status: AC
  Filled 2019-01-11: qty 10

## 2019-01-11 MED ORDER — ACETAMINOPHEN 500 MG PO TABS
ORAL_TABLET | ORAL | Status: AC
  Filled 2019-01-11: qty 2

## 2019-01-11 MED ORDER — FENTANYL CITRATE (PF) 100 MCG/2ML IJ SOLN
INTRAMUSCULAR | Status: AC
  Filled 2019-01-11: qty 2

## 2019-01-11 MED ORDER — MIDAZOLAM HCL 2 MG/2ML IJ SOLN
INTRAMUSCULAR | Status: AC
  Filled 2019-01-11: qty 2

## 2019-01-11 MED ORDER — ONDANSETRON HCL 4 MG/2ML IJ SOLN
INTRAMUSCULAR | Status: DC | PRN
  Administered 2019-01-11: 4 mg via INTRAVENOUS

## 2019-01-11 MED ORDER — PROPOFOL 10 MG/ML IV BOLUS
INTRAVENOUS | Status: DC | PRN
  Administered 2019-01-11: 30 mg via INTRAVENOUS
  Administered 2019-01-11: 100 mg via INTRAVENOUS

## 2019-01-11 MED ORDER — MIDAZOLAM HCL 2 MG/2ML IJ SOLN: 1.0000 mg | INTRAMUSCULAR | Status: DC | PRN

## 2019-01-11 MED ORDER — LACTATED RINGERS IV SOLN
INTRAVENOUS | Status: DC
  Administered 2019-01-11 (×2): via INTRAVENOUS

## 2019-01-11 MED ORDER — FENTANYL CITRATE (PF) 100 MCG/2ML IJ SOLN
50.0000 ug | INTRAMUSCULAR | Status: DC | PRN
  Administered 2019-01-11 (×2): 50 ug via INTRAVENOUS

## 2019-01-11 MED ORDER — CEFAZOLIN SODIUM-DEXTROSE 2-4 GM/100ML-% IV SOLN
2.0000 g | INTRAVENOUS | Status: AC
  Administered 2019-01-11: 2 g via INTRAVENOUS

## 2019-01-11 MED ORDER — EPHEDRINE SULFATE-NACL 50-0.9 MG/10ML-% IV SOSY
PREFILLED_SYRINGE | INTRAVENOUS | Status: DC | PRN
  Administered 2019-01-11 (×2): 10 mg via INTRAVENOUS

## 2019-01-11 SURGICAL SUPPLY — 92 items
BANDAGE ESMARK 6X9 LF (GAUZE/BANDAGES/DRESSINGS) ×1 IMPLANT
BIT DRILL 125X2.7XAO QCK (BIT) ×1 IMPLANT
BIT DRILL 2.7 (BIT) ×1
BIT DRILL CANN QC 2.85X190 (BIT) ×2 IMPLANT
BIT DRL 125X2.7XAO QCK (BIT) ×1
BLADE HEX COATED 2.75 (ELECTRODE) ×2 IMPLANT
BLADE SURG 15 STRL LF DISP TIS (BLADE) ×3 IMPLANT
BLADE SURG 15 STRL SS (BLADE) ×3
BNDG COHESIVE 6X5 TAN STRL LF (GAUZE/BANDAGES/DRESSINGS) ×2 IMPLANT
BNDG ELASTIC 4X5.8 VLCR STR LF (GAUZE/BANDAGES/DRESSINGS) IMPLANT
BNDG ELASTIC 6X5.8 VLCR STR LF (GAUZE/BANDAGES/DRESSINGS) ×2 IMPLANT
BNDG ESMARK 6X9 LF (GAUZE/BANDAGES/DRESSINGS) ×2
BRUSH SCRUB EZ PLAIN DRY (MISCELLANEOUS) ×2 IMPLANT
CANISTER SUCT 1200ML W/VALVE (MISCELLANEOUS) ×2 IMPLANT
COVER BACK TABLE REUSABLE LG (DRAPES) ×2 IMPLANT
COVER MAYO STAND REUSABLE (DRAPES) IMPLANT
COVER WAND RF STERILE (DRAPES) IMPLANT
CUFF TOURN SGL QUICK 34 (TOURNIQUET CUFF) ×1
CUFF TRNQT CYL 34X4.125X (TOURNIQUET CUFF) ×1 IMPLANT
DECANTER SPIKE VIAL GLASS SM (MISCELLANEOUS) IMPLANT
DRAPE C-ARM 42X72 X-RAY (DRAPES) ×2 IMPLANT
DRAPE C-ARMOR (DRAPES) ×2 IMPLANT
DRAPE EXTREMITY T 121X128X90 (DISPOSABLE) ×2 IMPLANT
DRAPE HALF SHEET 70X43 (DRAPES) ×2 IMPLANT
DRAPE IMP U-DRAPE 54X76 (DRAPES) ×2 IMPLANT
DRAPE INCISE IOBAN 66X45 STRL (DRAPES) IMPLANT
DRAPE SURG 17X23 STRL (DRAPES) IMPLANT
DRSG PAD ABDOMINAL 8X10 ST (GAUZE/BANDAGES/DRESSINGS) ×4 IMPLANT
DURAPREP 26ML APPLICATOR (WOUND CARE) ×6 IMPLANT
ELECT REM PT RETURN 9FT ADLT (ELECTROSURGICAL) ×2
ELECTRODE REM PT RTRN 9FT ADLT (ELECTROSURGICAL) ×1 IMPLANT
GAUZE SPONGE 4X4 12PLY STRL (GAUZE/BANDAGES/DRESSINGS) ×2 IMPLANT
GAUZE XEROFORM 1X8 LF (GAUZE/BANDAGES/DRESSINGS) ×2 IMPLANT
GLOVE BIOGEL PI IND STRL 7.0 (GLOVE) ×3 IMPLANT
GLOVE BIOGEL PI INDICATOR 7.0 (GLOVE) ×3
GLOVE ECLIPSE 7.0 STRL STRAW (GLOVE) ×6 IMPLANT
GLOVE SKINSENSE NS SZ7.5 (GLOVE) ×2
GLOVE SKINSENSE STRL SZ7.5 (GLOVE) ×2 IMPLANT
GLOVE SURG SYN 7.5  E (GLOVE) ×2
GLOVE SURG SYN 7.5 E (GLOVE) ×2 IMPLANT
GOWN STRL REIN XL XLG (GOWN DISPOSABLE) ×2 IMPLANT
GOWN STRL REUS W/ TWL LRG LVL3 (GOWN DISPOSABLE) ×1 IMPLANT
GOWN STRL REUS W/ TWL XL LVL3 (GOWN DISPOSABLE) ×2 IMPLANT
GOWN STRL REUS W/TWL LRG LVL3 (GOWN DISPOSABLE) ×1
GOWN STRL REUS W/TWL XL LVL3 (GOWN DISPOSABLE) ×2
K-WIRE 1.6 (WIRE) ×2
K-WIRE FX150X1.6XTROC TIP (WIRE) ×2
K-WIRE TROCAR TIP 1.4X150 (WIRE) ×4
KWIRE FX 150X1.6 TROC TIP (WIRE) ×2 IMPLANT
KWIRE TROCAR TIP 1.4X150 (WIRE) ×2 IMPLANT
MANIFOLD NEPTUNE II (INSTRUMENTS) ×2 IMPLANT
NEEDLE HYPO 22GX1.5 SAFETY (NEEDLE) IMPLANT
NS IRRIG 1000ML POUR BTL (IV SOLUTION) ×2 IMPLANT
PACK BASIN DAY SURGERY FS (CUSTOM PROCEDURE TRAY) ×2 IMPLANT
PAD CAST 3X4 CTTN HI CHSV (CAST SUPPLIES) IMPLANT
PAD CAST 4YDX4 CTTN HI CHSV (CAST SUPPLIES) IMPLANT
PADDING CAST COTTON 3X4 STRL (CAST SUPPLIES)
PADDING CAST COTTON 4X4 STRL (CAST SUPPLIES)
PADDING CAST COTTON 6X4 STRL (CAST SUPPLIES) ×4 IMPLANT
PADDING CAST SYN 6 (CAST SUPPLIES) ×1
PADDING CAST SYNTHETIC 4 (CAST SUPPLIES) ×1
PADDING CAST SYNTHETIC 4X4 STR (CAST SUPPLIES) ×1 IMPLANT
PADDING CAST SYNTHETIC 6X4 NS (CAST SUPPLIES) ×1 IMPLANT
PENCIL BUTTON HOLSTER BLD 10FT (ELECTRODE) ×2 IMPLANT
PLATE UNIV DISTAL FIB 5H (Plate) ×2 IMPLANT
SCREW CANC FT 4.0X18 (Screw) ×2 IMPLANT
SCREW CANN LT CAPT 4X36 (Screw) ×4 IMPLANT
SCREW CORT 12X3.5XANKL SS (Screw) ×3 IMPLANT
SCREW CORTEX 3.5X12MM (Screw) ×3 IMPLANT
SCREW LOCK MONOAX 3.5X12 (Screw) ×2 IMPLANT
SCREW LOCK MONOAX 3.5X16 (Screw) ×2 IMPLANT
SCREW LOCK MONOAX 3.5X18 (Screw) ×2 IMPLANT
SCREW NONLOCK ANT SS 3.5X22 (Screw) ×2 IMPLANT
SLEEVE SCD COMPRESS KNEE MED (MISCELLANEOUS) ×2 IMPLANT
SPLINT FIBERGLASS 4X30 (CAST SUPPLIES) ×2 IMPLANT
SPONGE LAP 18X18 RF (DISPOSABLE) ×2 IMPLANT
SUCTION FRAZIER HANDLE 10FR (MISCELLANEOUS) ×1
SUCTION TUBE FRAZIER 10FR DISP (MISCELLANEOUS) ×1 IMPLANT
SUT ETHILON 3 0 PS 1 (SUTURE) ×6 IMPLANT
SUT VIC AB 0 CT1 27 (SUTURE) ×2
SUT VIC AB 0 CT1 27XBRD ANBCTR (SUTURE) ×2 IMPLANT
SUT VIC AB 2-0 CT1 27 (SUTURE) ×2
SUT VIC AB 2-0 CT1 TAPERPNT 27 (SUTURE) ×2 IMPLANT
SUT VIC AB 3-0 SH 27 (SUTURE)
SUT VIC AB 3-0 SH 27X BRD (SUTURE) IMPLANT
SYR BULB 3OZ (MISCELLANEOUS) ×2 IMPLANT
SYR CONTROL 10ML LL (SYRINGE) IMPLANT
TOWEL GREEN STERILE FF (TOWEL DISPOSABLE) ×2 IMPLANT
TRAY DSU PREP LF (CUSTOM PROCEDURE TRAY) ×2 IMPLANT
TUBE CONNECTING 20X1/4 (TUBING) ×2 IMPLANT
UNDERPAD 30X36 HEAVY ABSORB (UNDERPADS AND DIAPERS) ×2 IMPLANT
YANKAUER SUCT BULB TIP NO VENT (SUCTIONS) ×2 IMPLANT

## 2019-01-11 NOTE — Anesthesia Procedure Notes (Signed)
Anesthesia Regional Block: Adductor canal block   Pre-Anesthetic Checklist: ,, timeout performed, Correct Patient, Correct Site, Correct Laterality, Correct Procedure,, site marked, risks and benefits discussed, Surgical consent,  Pre-op evaluation,  At surgeon's request and post-op pain management  Laterality: Right  Prep: chloraprep       Needles:  Injection technique: Single-shot  Needle Type: Echogenic Stimulator Needle     Needle Length: 9cm  Needle Gauge: 21     Additional Needles:   Procedures:,,,, ultrasound used (permanent image in chart),,,,  Narrative:  Start time: 01/11/2019 12:40 PM End time: 01/11/2019 12:50 PM Injection made incrementally with aspirations every 5 mL.  Performed by: Personally  Anesthesiologist: Murvin Natal, MD  Additional Notes: Functioning IV was confirmed and monitors were applied. A time-out was performed. Hand hygiene and sterile gloves were used. The thigh was placed in a frog-leg position and prepped in a sterile fashion. A 18mm 21ga Arrow echogenic stimulator needle was placed using ultrasound guidance.  Negative aspiration and negative test dose prior to incremental administration of local anesthetic. The patient tolerated the procedure well.

## 2019-01-11 NOTE — Discharge Instructions (Signed)
° ° °  1. Keep splint clean and dry 2. Elevate foot above level of the heart 3. Take aspirin to prevent blood clots 4. Take pain meds as needed 5. Strict non weight bearing to operative extremity    Post Anesthesia Home Care Instructions  Activity: Get plenty of rest for the remainder of the day. A responsible individual must stay with you for 24 hours following the procedure.  For the next 24 hours, DO NOT: -Drive a car -Paediatric nurse -Drink alcoholic beverages -Take any medication unless instructed by your physician -Make any legal decisions or sign important papers.  Meals: Start with liquid foods such as gelatin or soup. Progress to regular foods as tolerated. Avoid greasy, spicy, heavy foods. If nausea and/or vomiting occur, drink only clear liquids until the nausea and/or vomiting subsides. Call your physician if vomiting continues.  Special Instructions/Symptoms: Your throat may feel dry or sore from the anesthesia or the breathing tube placed in your throat during surgery. If this causes discomfort, gargle with warm salt water. The discomfort should disappear within 24 hours.  If you had a scopolamine patch placed behind your ear for the management of post- operative nausea and/or vomiting:  1. The medication in the patch is effective for 72 hours, after which it should be removed.  Wrap patch in a tissue and discard in the trash. Wash hands thoroughly with soap and water. 2. You may remove the patch earlier than 72 hours if you experience unpleasant side effects which may include dry mouth, dizziness or visual disturbances. 3. Avoid touching the patch. Wash your hands with soap and water after contact with the patch.   Regional Anesthesia Blocks  1. Numbness or the inability to move the "blocked" extremity may last from 3-48 hours after placement. The length of time depends on the medication injected and your individual response to the medication. If the numbness is not  going away after 48 hours, call your surgeon.  2. The extremity that is blocked will need to be protected until the numbness is gone and the  Strength has returned. Because you cannot feel it, you will need to take extra care to avoid injury. Because it may be weak, you may have difficulty moving it or using it. You may not know what position it is in without looking at it while the block is in effect.  3. For blocks in the legs and feet, returning to weight bearing and walking needs to be done carefully. You will need to wait until the numbness is entirely gone and the strength has returned. You should be able to move your leg and foot normally before you try and bear weight or walk. You will need someone to be with you when you first try to ensure you do not fall and possibly risk injury.  4. Bruising and tenderness at the needle site are common side effects and will resolve in a few days.  5. Persistent numbness or new problems with movement should be communicated to the surgeon or the [facility] [place] [phone] [place] [phone]).  No tylenol until after 7pm tonight

## 2019-01-11 NOTE — Anesthesia Procedure Notes (Signed)
Anesthesia Regional Block: Popliteal block   Pre-Anesthetic Checklist: ,, timeout performed, Correct Patient, Correct Site, Correct Laterality, Correct Procedure,, site marked, risks and benefits discussed, Surgical consent,  Pre-op evaluation,  At surgeon's request and post-op pain management  Laterality: Right  Prep: chloraprep       Needles:  Injection technique: Single-shot  Needle Type: Echogenic Stimulator Needle     Needle Length: 10cm  Needle Gauge: 21     Additional Needles:   Procedures:,,,, ultrasound used (permanent image in chart),,,,  Narrative:  Start time: 01/11/2019 12:30 PM End time: 01/11/2019 12:40 PM Injection made incrementally with aspirations every 5 mL.  Performed by: Personally   Additional Notes: Functioning IV was confirmed and monitors were applied.  A 156mm 21ga [place] echogenic stimulator needle was used. Sterile prep and drape,hand hygiene and sterile gloves were used.  Negative aspiration and negative test dose prior to incremental administration of local anesthetic. The patient tolerated the procedure well.

## 2019-01-11 NOTE — Anesthesia Preprocedure Evaluation (Signed)
Anesthesia Evaluation  Patient identified by MRN, date of birth, ID band Patient awake    Reviewed: Allergy & Precautions, NPO status , Patient's Chart, lab work & pertinent test results  Airway Mallampati: II  TM Distance: >3 FB Neck ROM: Full    Dental no notable dental hx.    Pulmonary asthma ,    Pulmonary exam normal breath sounds clear to auscultation       Cardiovascular hypertension, Pt. on medications Normal cardiovascular exam Rhythm:Regular Rate:Normal  ECG: NSR, rate 74   Neuro/Psych PSYCHIATRIC DISORDERS Anxiety Depression  Neuromuscular disease    GI/Hepatic Neg liver ROS, PUD, Medicated and Controlled,IBS (irritable bowel syndrome)   Endo/Other  negative endocrine ROS  Renal/GU negative Renal ROS     Musculoskeletal  (+) Arthritis , Fibromyalgia -  Abdominal (+) + obese,   Peds  Hematology negative hematology ROS (+)   Anesthesia Other Findings right bimalleolar ankle fracture  Reproductive/Obstetrics                             Anesthesia Physical Anesthesia Plan  ASA: III  Anesthesia Plan: General and Regional   Post-op Pain Management: GA combined w/ Regional for post-op pain   Induction: Intravenous  PONV Risk Score and Plan: 3 and Ondansetron, Dexamethasone and Treatment may vary due to age or medical condition  Airway Management Planned: LMA  Additional Equipment:   Intra-op Plan:   Post-operative Plan: Extubation in OR  Informed Consent: I have reviewed the patients History and Physical, chart, labs and discussed the procedure including the risks, benefits and alternatives for the proposed anesthesia with the patient or authorized representative who has indicated his/her understanding and acceptance.     Dental advisory given  Plan Discussed with: CRNA  Anesthesia Plan Comments:         Anesthesia Quick Evaluation

## 2019-01-11 NOTE — Transfer of Care (Signed)
Immediate Anesthesia Transfer of Care Note  Patient: Air cabin crew  Procedure(s) Performed: OPEN REDUCTION INTERNAL FIXATION (ORIF) RIGHT BIMALLEOLAR ANKLE FRACTURE (Right Ankle)  Patient Location: PACU  Anesthesia Type:GA combined with regional for post-op pain  Level of Consciousness: drowsy and patient cooperative  Airway & Oxygen Therapy: Patient Spontanous Breathing and Patient connected to face mask oxygen  Post-op Assessment: Report given to RN and Post -op Vital signs reviewed and stable  Post vital signs: Reviewed and stable  Last Vitals:  Vitals Value Taken Time  BP 163/74 01/11/19 1445  Temp    Pulse 77 01/11/19 1447  Resp 20 01/11/19 1447  SpO2 100 % 01/11/19 1447  Vitals shown include unvalidated device data.  Last Pain:  Vitals:   01/11/19 1245  TempSrc:   PainSc: 0-No pain         Complications: No apparent anesthesia complications

## 2019-01-11 NOTE — H&P (Signed)
PREOPERATIVE H&P  Chief Complaint: right bimalleolar ankle fracture  HPI: [name] is a 85 y.o. female who presents for surgical treatment of right bimalleolar ankle fracture.  She denies any changes in medical history.  Past Medical History:  Diagnosis Date  . Abdominal bloating   . Abnormal LFTs (liver function tests)   . Ankle fracture, bimalleolar, closed, right, initial encounter   . Anxiety disorder   . Arthritis   . Asthma   . Depression   . Diverticulosis   . Edema   . Fatty liver   . Fibromyalgia   . GERD (gastroesophageal reflux disease)   . Giant cell arteritis ([facility])    No biopsy secondary to steroid use  . Hx of adenomatous colonic polyps   . Hyperlipidemia   . Hypertension   . IBS (irritable bowel syndrome)   . Lumbar radiculitis   . Osteoporosis 09/2017   T score -2.6 left femoral neck  . Peptic ulcer disease   . Skin cancer    shoulder, leg, right eye (? squamous by resection)  . Thoracic radiculitis   . Thyroid nodule    "removed"  . Urinary incontinence   . Vertigo    Past Surgical History:  Procedure Laterality Date  . ABDOMINAL HYSTERECTOMY    . BACK SURGERY  2005   lower back  . EYE SURGERY Right 2006  . LIVER BIOPSY     fatty liver  . NASAL SEPTUM SURGERY    . right knee surgery  2006   arthroscopy  . right leg skin cancer surgery  2014  . right shoulder skin cancer excision  2012  . THYROIDECTOMY     Social History   Socioeconomic History  . Marital status: Unknown    Spouse name: Not on file  . Number of children: 3  . Years of education: Not on file  . Highest education level: Not on file  Occupational History  . Occupation: Retired Water engineer  Social Needs  . Financial resource strain: Not on file  . Food insecurity    Worry: Not on file    Inability: Not on file  . Transportation needs    Medical: Not on file    Non-medical: Not on file  Tobacco Use  . Smoking status: Never Smoker  . Smokeless  tobacco: Never Used  Substance and Sexual Activity  . Alcohol use: No  . Drug use: No  . Sexual activity: Not Currently    Comment: intercourser age 36, less than 71 secxual partners  Lifestyle  . Physical activity    Days per week: Not on file    Minutes per session: Not on file  . Stress: Not on file  Relationships  . Social Herbalist on phone: Not on file    Gets together: Not on file    Attends religious service: Not on file    Active member of club or organization: Not on file    Attends meetings of clubs or organizations: Not on file    Relationship status: Not on file  Other Topics Concern  . Not on file  Social History Narrative   Married, husband Armed forces training and education officer.    Lives with [name] and [name].   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   Patient takes a daily vitamin   She wears her seat belt. Exercises greater than 3 times a week.   Wears 2 hearing aids. No dentures.   "Sometimes "requires assistive  device for walking, either a walker or wheelchair.   There is a smoke detector in her home. There are firearms in her home. She feels safe in her relationships.   Family History  Problem Relation Age of Onset  . Colon polyps Maternal Aunt   . Diabetes Father   . Heart disease Father   . Heart disease Mother   . Lung disease Mother   . COPD Mother   . Lung cancer Brother   . Cancer Brother        lung  . COPD Brother   . Stroke Brother   . Lung cancer Sister   . COPD Sister   . Kidney disease Other        niece-mat side  . Arthritis Other   . Diabetes Son   . Arthritis Daughter   . Colon cancer Neg Hx    Allergies  Allergen Reactions  . Atorvastatin Other (See Comments)    Severe headache  . Oxycodone Itching    hallucinations  . Statins   . Advair Diskus [Fluticasone-Salmeterol] Other (See Comments)    Mouth sores  . Sulfonamide Derivatives Rash   Prior to Admission medications   Medication Sig Start Date End Date Taking? Authorizing  Provider  albuterol (PROAIR HFA) 108 (90 Base) MCG/ACT inhaler 2 puffs up to every 4 hours if can't catch your breath 01/13/18  Yes Kuneff, Renee A, DO  aspirin 81 MG tablet Take 81 mg by mouth daily.     Yes [provider]  budesonide-formoterol (SYMBICORT) 80-4.5 MCG/ACT inhaler USE 2 INHALATIONS TWICE A DAY 07/28/18  Yes Kuneff, Renee A, DO  diazepam (VALIUM) 5 MG tablet 1/2 tab PO in the morning and 1 tab QHS PRN. 07/28/18  Yes Kuneff, Renee A, DO  FLUoxetine (PROZAC) 20 MG capsule TAKE 3 CAPSULES DAILY 12/17/18  Yes Kuneff, Renee A, DO  HYDROcodone-acetaminophen (NORCO) 5-325 MG tablet Take 1-2 tablets by mouth daily as needed. 01/06/19  Yes Leandrew Koyanagi, MD  olmesartan (BENICAR) 20 MG tablet TAKE ONE AND ONE-HALF TABLETS DAILY 12/17/18  Yes Kuneff, Renee A, DO  omega-3 acid ethyl esters (LOVAZA) 1 g capsule Take 2 capsules (2 g total) by mouth 2 (two) times daily. 07/28/18  Yes Kuneff, Renee A, DO  omeprazole (PRILOSEC) 40 MG capsule Take by mouth. 08/26/17  Yes [provider]  oxyCODONE (ROXICODONE) 5 MG immediate release tablet Take 1 tablet (5 mg total) by mouth every 4 (four) hours as needed for severe pain. 01/05/19  Yes Maudie Flakes, MD  predniSONE (DELTASONE) 5 MG tablet Take 5 mg by mouth daily.  07/22/16  Yes [provider]  tocilizumab (ACTEMRA) 400 MG/20ML SOLN injection Infuse 480 mg into the vein once every 28 days. 11/27/17  Yes [provider]  traMADol (ULTRAM) 50 MG tablet TAKE ONE TABLET BY MOUTH EVERY 6 HOURS AS NEEDED FOR MODERATE PAIN 11/12/17  Yes Jessy Oto, MD  tocilizumab 4 mg/kg in sodium chloride 0.9 % Inject 280 mg into the vein. 12/03/16   [provider]  triamcinolone (KENALOG) 0.025 % ointment Apply a very thin layer to affected area every 2 days. 09/24/17   Princess Bruins, MD     Positive ROS: All other systems have been reviewed and were otherwise negative with the exception of those mentioned in the HPI and as  above.  Physical Exam: General: Alert, no acute distress Cardiovascular: No pedal edema Respiratory: No cyanosis, no use of accessory musculature GI: abdomen  soft Skin: No lesions in the area of chief complaint Neurologic: Sensation intact distally Psychiatric: Patient is competent for consent with normal mood and affect Lymphatic: no lymphedema  MUSCULOSKELETAL: exam stable  Assessment: right bimalleolar ankle fracture  Plan: Plan for Procedure(s): OPEN REDUCTION INTERNAL FIXATION (ORIF) RIGHT BIMALLEOLAR ANKLE FRACTURE  The risks benefits and alternatives were discussed with the patient including but not limited to the risks of nonoperative treatment, versus surgical intervention including infection, bleeding, nerve injury,  blood clots, cardiopulmonary complications, morbidity, mortality, among others, and they were willing to proceed.   Eduard Roux, MD   01/11/2019 11:53 AM

## 2019-01-11 NOTE — Op Note (Signed)
Date of Surgery: 01/11/2019  INDICATIONS: Ms. Coriell is a 85 y.o.-year-old female who sustained a right ankle fracture; she was indicated for open reduction and internal fixation due to the displaced nature of the articular fracture and came to the operating room today for this procedure. The patient did consent to the procedure after discussion of the risks and benefits.  PREOPERATIVE DIAGNOSIS: right bimalleolar ankle fracture  POSTOPERATIVE DIAGNOSIS: Same.  PROCEDURE: Open treatment of right ankle fracture with internal fixation. Bimalleolar CPT [phone] SURGEON: N. Eduard Roux, M.D.  ASSIST: Ciro Backer [place], [place]; necessary for the timely completion of procedure and due to complexity of procedure.  ANESTHESIA:  general, regional block  TOURNIQUET TIME: less than 60 mins  IV FLUIDS AND URINE: See anesthesia.  ESTIMATED BLOOD LOSS: minimal mL.  IMPLANTS: Globus  COMPLICATIONS: see description of procedure.  DESCRIPTION OF PROCEDURE: The patient was brought to the operating room and placed supine on the operating table.  The patient had been signed prior to the procedure and this was documented. The patient had the anesthesia placed by the anesthesiologist.  A nonsterile tourniquet was placed on the upper thigh.  The prep verification and incision time-outs were performed to confirm that this was the correct patient, site, side and location. The patient had an SCD on the opposite lower extremity. The patient did receive antibiotics prior to the incision and was re-dosed during the procedure as needed at indicated intervals.  The patient had the lower extremity prepped and draped in the standard surgical fashion.  The extremity was exsanguinated using an esmarch bandage and the tourniquet was inflated to 300 mm Hg.  An incision was made on the lateral aspect of the ankle over the fibula.  Full-thickness flaps were raised.  Subperiosteal elevation was performed around the fracture  site.  The fracture was exposed.  The fracture was then reduced by traction and rotation and with the help of a tenaculum clamp.  A lag screw was placed using standard AO technique.  Clamps were then removed.  I then placed a precontoured fibular plate on the lateral aspect of distal fibula at the appropriate position using fluoroscopic guidance.  Nonlocking and locking screws were placed through the plate into the bone with decent purchase.  She did have some evidence of osteopenia.  Unicortical locking screws were placed distally to avoid penetration into the joint.  I then made a separate incision over the medial malleolus.  Full-thickness flaps were raised again.  The fracture was visualized and entrapped periosteum and organized hematoma were removed from the fracture site.  The fracture was then reduced and held with a tenaculum.  2 parallel K wires were advanced up the medial malleolus using fluoroscopic guidance.  Partially-threaded cancellous screws were then placed over the K wires for fixation and compression across the fracture.  The K wires were removed.  Stress exam of the ankle showed no widening of the medial clear space.  The surgical wounds were thoroughly irrigated.  Layered closure was performed.  Sterile dressings were applied.  Short leg splint was placed.  Patient tolerated the procedure well and no immediate complications.  POSTOPERATIVE PLAN: Ms. Nalley will remain nonweightbearing on this leg for approximately 6 weeks; Ms. Risk will return for suture removal in 2 weeks.  He will be immobilized in a short leg splint and then transitioned to a CAM walker at his first follow up appointment.  Ms. Liaw will receive DVT prophylaxis based on other medications, activity level,  and risk ratio of bleeding to thrombosis.  Azucena Cecil, MD [facility] 2:17 PM

## 2019-01-11 NOTE — Anesthesia Postprocedure Evaluation (Signed)
Anesthesia Post Note  Patient: Air cabin crew  Procedure(s) Performed: OPEN REDUCTION INTERNAL FIXATION (ORIF) RIGHT BIMALLEOLAR ANKLE FRACTURE (Right Ankle)     Patient location during evaluation: PACU Anesthesia Type: Regional and General Level of consciousness: awake and alert Pain management: pain level controlled Vital Signs Assessment: post-procedure vital signs reviewed and stable Respiratory status: spontaneous breathing, nonlabored ventilation, respiratory function stable and patient connected to nasal cannula oxygen Cardiovascular status: blood pressure returned to baseline and stable Postop Assessment: no apparent nausea or vomiting Anesthetic complications: no    Last Vitals:  Vitals:   01/11/19 1617 01/11/19 1630  BP: (!) 176/69 (!) 175/63  Pulse: 91 86  Resp:    Temp:    SpO2: 99% 99%    Last Pain:  Vitals:   01/11/19 1615  TempSrc:   PainSc: 2                  Ryan P Ellender

## 2019-01-11 NOTE — Progress Notes (Signed)
Assisted Dr. Roanna Banning with right, ultrasound guided, popliteal, adductor canal block. Side rails up, monitors on throughout procedure. See vital signs in flow sheet. Tolerated Procedure well.

## 2019-01-11 NOTE — Anesthesia Procedure Notes (Signed)
Procedure Name: LMA Insertion Date/Time: 01/11/2019 1:07 PM Performed by: Genelle Bal, CRNA Pre-anesthesia Checklist: Patient identified, Emergency Drugs available, Suction available and Patient being monitored Patient Re-evaluated:Patient Re-evaluated prior to induction Oxygen Delivery Method: Circle system utilized Preoxygenation: Pre-oxygenation with 100% oxygen Induction Type: IV induction Ventilation: Mask ventilation without difficulty LMA: LMA inserted LMA Size: 4.0 Number of attempts: 1 Airway Equipment and Method: Bite block Placement Confirmation: positive ETCO2 Tube secured with: Tape Dental Injury: Teeth and Oropharynx as per pre-operative assessment

## 2019-01-12 ENCOUNTER — Other Ambulatory Visit: Payer: Self-pay | Admitting: Physician Assistant

## 2019-01-12 DIAGNOSIS — S82841D Displaced bimalleolar fracture of right lower leg, subsequent encounter for closed fracture with routine healing: Secondary | ICD-10-CM | POA: Diagnosis not present

## 2019-01-12 DIAGNOSIS — M315 Giant cell arteritis with polymyalgia rheumatica: Secondary | ICD-10-CM | POA: Diagnosis not present

## 2019-01-12 DIAGNOSIS — R296 Repeated falls: Secondary | ICD-10-CM | POA: Diagnosis not present

## 2019-01-12 DIAGNOSIS — M797 Fibromyalgia: Secondary | ICD-10-CM | POA: Diagnosis not present

## 2019-01-12 DIAGNOSIS — I10 Essential (primary) hypertension: Secondary | ICD-10-CM | POA: Diagnosis not present

## 2019-01-12 DIAGNOSIS — Z86010 Personal history of colonic polyps: Secondary | ICD-10-CM | POA: Diagnosis not present

## 2019-01-12 DIAGNOSIS — K219 Gastro-esophageal reflux disease without esophagitis: Secondary | ICD-10-CM | POA: Diagnosis not present

## 2019-01-12 DIAGNOSIS — M5416 Radiculopathy, lumbar region: Secondary | ICD-10-CM | POA: Diagnosis not present

## 2019-01-12 DIAGNOSIS — M810 Age-related osteoporosis without current pathological fracture: Secondary | ICD-10-CM | POA: Diagnosis not present

## 2019-01-12 DIAGNOSIS — Z85828 Personal history of other malignant neoplasm of skin: Secondary | ICD-10-CM | POA: Diagnosis not present

## 2019-01-12 DIAGNOSIS — J4540 Moderate persistent asthma, uncomplicated: Secondary | ICD-10-CM | POA: Diagnosis not present

## 2019-01-12 DIAGNOSIS — Z9181 History of falling: Secondary | ICD-10-CM | POA: Diagnosis not present

## 2019-01-12 DIAGNOSIS — M5414 Radiculopathy, thoracic region: Secondary | ICD-10-CM | POA: Diagnosis not present

## 2019-01-12 DIAGNOSIS — F418 Other specified anxiety disorders: Secondary | ICD-10-CM | POA: Diagnosis not present

## 2019-01-12 DIAGNOSIS — E785 Hyperlipidemia, unspecified: Secondary | ICD-10-CM | POA: Diagnosis not present

## 2019-01-12 MED ORDER — HYDROCODONE-ACETAMINOPHEN 7.5-325 MG PO TABS: 1.0000 | ORAL_TABLET | ORAL | 0 refills | Status: DC | PRN

## 2019-01-13 ENCOUNTER — Encounter (HOSPITAL_BASED_OUTPATIENT_CLINIC_OR_DEPARTMENT_OTHER): Payer: Self-pay | Admitting: Orthopaedic Surgery

## 2019-01-15 ENCOUNTER — Telehealth: Payer: Self-pay | Admitting: Orthopaedic Surgery

## 2019-01-15 NOTE — Telephone Encounter (Signed)
I tried to reach [place] but mailbox is full and not accepting new messages. Holding for you.

## 2019-01-15 NOTE — Telephone Encounter (Signed)
Ok.  Is this PT only to help with transfers?  I'm a little confused why she is getting PT as we just operated on her ankle

## 2019-01-15 NOTE — Telephone Encounter (Signed)
Sharyn Lull from [facility] [place] called stating that she started her PT on Tuesday, but she is concerned about her safety with the transfers.  She attempted to schedule another appointment, but the patient's daughter stated that it was too much too soon.  Sharyn Lull will attempt again next week to schedule another appointment.  CB#[phone].  Thank you.

## 2019-01-19 ENCOUNTER — Telehealth: Payer: Self-pay | Admitting: Orthopaedic Surgery

## 2019-01-19 NOTE — Telephone Encounter (Signed)
[facility] with [facility] called in to let dr.xu know that the pt is requesting to hold off on starting physical therapy until after her visit 11/10 with dr.xu. Tom just wanted to let dr.xu know that she hasn't missed any appts.  [phone]

## 2019-01-20 NOTE — Telephone Encounter (Signed)
FYI

## 2019-01-21 ENCOUNTER — Other Ambulatory Visit: Payer: Self-pay | Admitting: Physician Assistant

## 2019-01-22 ENCOUNTER — Telehealth: Payer: Self-pay | Admitting: Orthopaedic Surgery

## 2019-01-22 NOTE — Telephone Encounter (Signed)
Patient's daughter called requesting an RX refill on her Hydrocodone.  Patient only has a couple of pills left. Patient uses Haematologist in [place].  CB#[phone].  Thank you.

## 2019-01-25 ENCOUNTER — Other Ambulatory Visit: Payer: Self-pay | Admitting: Physician Assistant

## 2019-01-25 ENCOUNTER — Other Ambulatory Visit: Payer: Self-pay | Admitting: Orthopedic Surgery

## 2019-01-25 MED ORDER — HYDROCODONE-ACETAMINOPHEN 5-325 MG PO TABS: 1.0000 | ORAL_TABLET | Freq: Four times a day (QID) | ORAL | 0 refills | Status: DC | PRN

## 2019-01-25 NOTE — Telephone Encounter (Signed)
I am at the hospital and for some reason this will not let me e prescribe this medicine.  Can you see if someone at office can write for norco 5-325 take one tab po every 6 hours prn pain #30

## 2019-01-25 NOTE — Telephone Encounter (Signed)
Can you please rx for patient?  Instructions per Ria Comment below. Thanks.

## 2019-01-25 NOTE — Telephone Encounter (Signed)
Please advise 

## 2019-01-25 NOTE — Telephone Encounter (Signed)
rx sent

## 2019-01-26 ENCOUNTER — Encounter: Payer: Self-pay | Admitting: Orthopaedic Surgery

## 2019-01-26 ENCOUNTER — Ambulatory Visit (INDEPENDENT_AMBULATORY_CARE_PROVIDER_SITE_OTHER): Payer: Medicare Other

## 2019-01-26 ENCOUNTER — Ambulatory Visit (INDEPENDENT_AMBULATORY_CARE_PROVIDER_SITE_OTHER): Payer: Medicare Other | Admitting: Orthopaedic Surgery

## 2019-01-26 ENCOUNTER — Other Ambulatory Visit: Payer: Self-pay

## 2019-01-26 DIAGNOSIS — S82841A Displaced bimalleolar fracture of right lower leg, initial encounter for closed fracture: Secondary | ICD-10-CM

## 2019-01-26 NOTE — Progress Notes (Signed)
Post-Op Visit Note   Patient: [name]           Date of Birth: 06-12-1929           MRN: [id number] Visit Date: 01/26/2019 PCP: Ma Hillock, DO   Assessment & Plan:  Chief Complaint:  Chief Complaint  Patient presents with  . Right Ankle - Pain, Follow-up   Visit Diagnoses:  1. Bimalleolar ankle fracture, right, closed, initial encounter     Plan: Cesilia is 2-week status post ORIF right bimalleolar ankle fracture.  Overall she is doing well.  She has completed 1 session of formal physical therapy.  She has been very compliant with elevation and nonweightbearing.  She is currently ambulating in a wheelchair.  Her x-rays are unremarkable.  We will continue with nonweightbearing.  We will extend her aspirin for DVT prophylaxis until she follows up in a month.  She will need three-view x-rays of the right ankle at that time.  Follow-Up Instructions: Return in about 4 weeks (around 02/23/2019).   Orders:  Orders Placed This Encounter  Procedures  . XR Ankle Complete Right   No orders of the defined types were placed in this encounter.   Imaging: Xr Ankle Complete Right  Result Date: 01/26/2019 Stable fixation alignment of ankle fracture without hardware complications.   PMFS History: Patient Active Problem List   Diagnosis Date Noted  . Bimalleolar ankle fracture, right, closed, initial encounter 01/06/2019  . Acute vulvitis 07/31/2018  . Mixed stress and urge urinary incontinence 09/04/2017  . Elevated hemoglobin ([place]) 04/04/2017  . Chronic back pain 01/28/2017  . Morbid obesity ([place]) 01/28/2017  . Peptic ulcer disease   . Lumbar radiculitis   . Frequent falls 08/02/2016  . Hypertensive retinopathy of both eyes 09/07/2015  . Medicare annual wellness visit, subsequent 07/11/2015  . Long-term use of high-risk medication 07/11/2015  . Osteopenia 04/11/2015  . Depression with anxiety 04/11/2015  . Giant cell arteritis ([place]) 03/28/2015  . Polymyalgia  rheumatica ([place]) 03/16/2015  . Chronic narcotic use 03/16/2015  . Anemia, iron deficiency 07/14/2013  . Asthma, moderate persistent 10/15/2012  . Hyperlipidemia 12/27/2008  . HYPERTENSION, BENIGN 12/27/2008   Past Medical History:  Diagnosis Date  . Abdominal bloating   . Abnormal LFTs (liver function tests)   . Ankle fracture, bimalleolar, closed, right, initial encounter   . Anxiety disorder   . Arthritis   . Asthma   . Depression   . Diverticulosis   . Edema   . Fatty liver   . Fibromyalgia   . GERD (gastroesophageal reflux disease)   . Giant cell arteritis (H[facility])    No biopsy secondary to steroid use  . Hx of adenomatous colonic polyps   . Hyperlipidemia   . Hypertension   . IBS (irritable bowel syndrome)   . Lumbar radiculitis   . Osteoporosis 09/2017   T score -2.6 left femoral neck  . Peptic ulcer disease   . Skin cancer    shoulder, leg, right eye (? squamous by resection)  . Thoracic radiculitis   . Thyroid nodule    "removed"  . Urinary incontinence   . Vertigo     Family History  Problem Relation Age of Onset  . Colon polyps Maternal Aunt   . Diabetes Father   . Heart disease Father   . Heart disease Mother   . Lung disease Mother   . COPD Mother   . Lung cancer Brother   . Cancer Brother  lung  . COPD Brother   . Stroke Brother   . Lung cancer Sister   . COPD Sister   . Kidney disease Other        niece-mat side  . Arthritis Other   . Diabetes Son   . Arthritis Daughter   . Colon cancer Neg Hx     Past Surgical History:  Procedure Laterality Date  . ABDOMINAL HYSTERECTOMY    . BACK SURGERY  2005   lower back  . EYE SURGERY Right 2006  . LIVER BIOPSY     fatty liver  . NASAL SEPTUM SURGERY    . ORIF ANKLE FRACTURE Right 01/11/2019   Procedure: OPEN REDUCTION INTERNAL FIXATION (ORIF) RIGHT BIMALLEOLAR ANKLE FRACTURE;  Surgeon: Leandrew Koyanagi, MD;  Location: [facility];  Service: Orthopedics;  Laterality: Right;  .  right knee surgery  2006   arthroscopy  . right leg skin cancer surgery  2014  . right shoulder skin cancer excision  2012  . THYROIDECTOMY     Social History   Occupational History  . Occupation: Retired Water engineer  Tobacco Use  . Smoking status: Never Smoker  . Smokeless tobacco: Never Used  Substance and Sexual Activity  . Alcohol use: No  . Drug use: No  . Sexual activity: Not Currently    Comment: intercourser age 31, less than 5 secxual partners

## 2019-01-26 NOTE — Telephone Encounter (Signed)
I left voicemail advising. ?

## 2019-01-28 ENCOUNTER — Telehealth: Payer: Self-pay | Admitting: Orthopaedic Surgery

## 2019-01-28 NOTE — Telephone Encounter (Signed)
[facility] L[facility] with [facility] called in wanting to inform dr.xu that the pt is refusing any further pt services. Any questions give her a call [phone]

## 2019-01-28 NOTE — Telephone Encounter (Signed)
FYI

## 2019-01-29 NOTE — Telephone Encounter (Signed)
Ok, thanks.

## 2019-02-23 ENCOUNTER — Ambulatory Visit (INDEPENDENT_AMBULATORY_CARE_PROVIDER_SITE_OTHER): Payer: Medicare Other

## 2019-02-23 ENCOUNTER — Other Ambulatory Visit: Payer: Self-pay

## 2019-02-23 ENCOUNTER — Ambulatory Visit (INDEPENDENT_AMBULATORY_CARE_PROVIDER_SITE_OTHER): Payer: Medicare Other | Admitting: Orthopaedic Surgery

## 2019-02-23 DIAGNOSIS — S82841A Displaced bimalleolar fracture of right lower leg, initial encounter for closed fracture: Secondary | ICD-10-CM

## 2019-02-23 NOTE — Progress Notes (Signed)
Post-Op Visit Note   Patient: [name]           Date of Birth: [birth year]           MRN: [id number] Visit Date: 02/23/2019 PCP: Ma Hillock, DO   Assessment & Plan:  Chief Complaint:  Chief Complaint  Patient presents with  . Right Ankle - Routine Post Op   Visit Diagnoses:  1. Bimalleolar ankle fracture, right, closed, initial encounter     Plan: Nautika need is 6 weeks status post ORIF right bimalleolar ankle fracture.  She denies any pain.  No real complaints today.  Surgical scars are healed without signs of infection.  X-rays demonstrate healing of the fracture without any complications of the hardware.  At this point recommend sending to outpatient PT for gait training and balance and strengthening.  She may weight-bear as tolerated in the boot and wean as tolerated.  Questions encouraged and answered.  Follow-up in 6 weeks with three-view x-rays of the right ankle.  Follow-Up Instructions: Return in about 6 weeks (around 04/06/2019).   Orders:  Orders Placed This Encounter  Procedures  . XR Ankle Complete Right   No orders of the defined types were placed in this encounter.   Imaging: Xr Ankle Complete Right  Result Date: 02/23/2019 Stable fixation of ankle fracture.  There appears to be healing of the fractures.   PMFS History: Patient Active Problem List   Diagnosis Date Noted  . Bimalleolar ankle fracture, right, closed, initial encounter 01/06/2019  . Acute vulvitis 07/31/2018  . Mixed stress and urge urinary incontinence 09/04/2017  . Elevated hemoglobin ([place]) 04/04/2017  . Chronic back pain 01/28/2017  . Morbid obesity ([place]) 01/28/2017  . Peptic ulcer disease   . Lumbar radiculitis   . Frequent falls 08/02/2016  . Hypertensive retinopathy of both eyes 09/07/2015  . Medicare annual wellness visit, subsequent 07/11/2015  . Long-term use of high-risk medication 07/11/2015  . Osteopenia 04/11/2015  . Depression with anxiety 04/11/2015  .  Giant cell arteritis ([place]) 03/28/2015  . Polymyalgia rheumatica ([place]) 03/16/2015  . Chronic narcotic use 03/16/2015  . Anemia, iron deficiency 07/14/2013  . Asthma, moderate persistent 10/15/2012  . Hyperlipidemia 12/27/2008  . HYPERTENSION, BENIGN 12/27/2008   Past Medical History:  Diagnosis Date  . Abdominal bloating   . Abnormal LFTs (liver function tests)   . Ankle fracture, bimalleolar, closed, right, initial encounter   . Anxiety disorder   . Arthritis   . Asthma   . Depression   . Diverticulosis   . Edema   . Fatty liver   . Fibromyalgia   . GERD (gastroesophageal reflux disease)   . Giant cell arteritis (HCC)    No biopsy secondary to steroid use  . Hx of adenomatous colonic polyps   . Hyperlipidemia   . Hypertension   . IBS (irritable bowel syndrome)   . Lumbar radiculitis   . Osteoporosis 09/2017   T score -2.6 left femoral neck  . Peptic ulcer disease   . Skin cancer    shoulder, leg, right eye (? squamous by resection)  . Thoracic radiculitis   . Thyroid nodule    "removed"  . Urinary incontinence   . Vertigo     Family History  Problem Relation Age of Onset  . Colon polyps Maternal Aunt   . Diabetes Father   . Heart disease Father   . Heart disease Mother   . Lung disease Mother   . COPD Mother   .  Lung cancer Brother   . Cancer Brother        lung  . COPD Brother   . Stroke Brother   . Lung cancer Sister   . COPD Sister   . Kidney disease Other        niece-mat side  . Arthritis Other   . Diabetes Son   . Arthritis Daughter   . Colon cancer Neg Hx     Past Surgical History:  Procedure Laterality Date  . ABDOMINAL HYSTERECTOMY    . BACK SURGERY  2005   lower back  . EYE SURGERY Right 2006  . LIVER BIOPSY     fatty liver  . NASAL SEPTUM SURGERY    . ORIF ANKLE FRACTURE Right 01/11/2019   Procedure: OPEN REDUCTION INTERNAL FIXATION (ORIF) RIGHT BIMALLEOLAR ANKLE FRACTURE;  Surgeon: Leandrew Koyanagi, MD;  Location: [facility];  Service: Orthopedics;  Laterality: Right;  . right knee surgery  2006   arthroscopy  . right leg skin cancer surgery  2014  . right shoulder skin cancer excision  2012  . THYROIDECTOMY     Social History   Occupational History  . Occupation: Retired Water engineer  Tobacco Use  . Smoking status: Never Smoker  . Smokeless tobacco: Never Used  Substance and Sexual Activity  . Alcohol use: No  . Drug use: No  . Sexual activity: Not Currently    Comment: intercourser age 48, less than 5 secxual partners

## 2019-02-25 DIAGNOSIS — M316 Other giant cell arteritis: Secondary | ICD-10-CM | POA: Diagnosis not present

## 2019-02-25 DIAGNOSIS — Z79899 Other long term (current) drug therapy: Secondary | ICD-10-CM | POA: Diagnosis not present

## 2019-03-17 ENCOUNTER — Other Ambulatory Visit: Payer: Self-pay | Admitting: Family Medicine

## 2019-03-17 DIAGNOSIS — I10 Essential (primary) hypertension: Secondary | ICD-10-CM

## 2019-04-06 ENCOUNTER — Ambulatory Visit (INDEPENDENT_AMBULATORY_CARE_PROVIDER_SITE_OTHER): Payer: Medicare Other | Admitting: Orthopaedic Surgery

## 2019-04-06 ENCOUNTER — Other Ambulatory Visit: Payer: Self-pay

## 2019-04-06 ENCOUNTER — Ambulatory Visit (INDEPENDENT_AMBULATORY_CARE_PROVIDER_SITE_OTHER): Payer: Medicare Other

## 2019-04-06 ENCOUNTER — Encounter: Payer: Self-pay | Admitting: Orthopaedic Surgery

## 2019-04-06 DIAGNOSIS — S82841A Displaced bimalleolar fracture of right lower leg, initial encounter for closed fracture: Secondary | ICD-10-CM | POA: Diagnosis not present

## 2019-04-06 NOTE — Progress Notes (Signed)
Post-Op Visit Note   Patient: [name]           Date of Birth: [birth year]           MRN: [id number] Visit Date: 04/06/2019 PCP: Ma Hillock, DO   Assessment & Plan:  Chief Complaint:  Chief Complaint  Patient presents with  . Right Ankle - Pain   Visit Diagnoses:  1. Bimalleolar ankle fracture, right, closed, initial encounter     Plan: Patient is a pleasant 86 year old female who comes in today 12 weeks out ORIF right bimalleolar ankle fracture 01/11/2019.  She has been doing very well.  No pain.  She has been ambulating in a cam boot in a wheelchair the majority of the time but is unable to ambulate with a walker which is her baseline.  She never went to physical therapy as she did not want to have any unnecessary exposure to Covid.  Examination of her right ankle reveals fully healed surgical incision.  Mild swelling.  No tenderness.  She does have limited dorsiflexion secondary to stiffness.  She is neurovascular intact distally.  At this point, she can wean out of the Cam walker into an ASO weightbearing as tolerated.  Try to reinforce the need for physical therapy to help with gait training and range of motion.  I provided her with another prescription for a grade PT.  She will follow-up with [place] as needed as her fracture is healed.  Follow-Up Instructions: Return if symptoms worsen or fail to improve.   Orders:  Orders Placed This Encounter  Procedures  . XR Ankle Complete Right   No orders of the defined types were placed in this encounter.   Imaging: XR Ankle Complete Right  Result Date: 04/06/2019 X-rays demonstrate a healed bimalleolar ankle fracture   PMFS History: Patient Active Problem List   Diagnosis Date Noted  . Bimalleolar ankle fracture, right, closed, initial encounter 01/06/2019  . Acute vulvitis 07/31/2018  . Mixed stress and urge urinary incontinence 09/04/2017  . Elevated hemoglobin ([place]) 04/04/2017  . Chronic back pain 01/28/2017  .  Morbid obesity ([place]) 01/28/2017  . Peptic ulcer disease   . Lumbar radiculitis   . Frequent falls 08/02/2016  . Hypertensive retinopathy of both eyes 09/07/2015  . Medicare annual wellness visit, subsequent 07/11/2015  . Long-term use of high-risk medication 07/11/2015  . Osteopenia 04/11/2015  . Depression with anxiety 04/11/2015  . Giant cell arteritis ([place]) 03/28/2015  . Polymyalgia rheumatica ([place]) 03/16/2015  . Chronic narcotic use 03/16/2015  . Anemia, iron deficiency 07/14/2013  . Asthma, moderate persistent 10/15/2012  . Hyperlipidemia 12/27/2008  . HYPERTENSION, BENIGN 12/27/2008   Past Medical History:  Diagnosis Date  . Abdominal bloating   . Abnormal LFTs (liver function tests)   . Ankle fracture, bimalleolar, closed, right, initial encounter   . Anxiety disorder   . Arthritis   . Asthma   . Depression   . Diverticulosis   . Edema   . Fatty liver   . Fibromyalgia   . GERD (gastroesophageal reflux disease)   . Giant cell arteritis (HCC)    No biopsy secondary to steroid use  . Hx of adenomatous colonic polyps   . Hyperlipidemia   . Hypertension   . IBS (irritable bowel syndrome)   . Lumbar radiculitis   . Osteoporosis 09/2017   T score -2.6 left femoral neck  . Peptic ulcer disease   . Skin cancer    shoulder, leg, right eye (?  squamous by resection)  . Thoracic radiculitis   . Thyroid nodule    "removed"  . Urinary incontinence   . Vertigo     Family History  Problem Relation Age of Onset  . Colon polyps Maternal Aunt   . Diabetes Father   . Heart disease Father   . Heart disease Mother   . Lung disease Mother   . COPD Mother   . Lung cancer Brother   . Cancer Brother        lung  . COPD Brother   . Stroke Brother   . Lung cancer Sister   . COPD Sister   . Kidney disease Other        niece-mat side  . Arthritis Other   . Diabetes Son   . Arthritis Daughter   . Colon cancer Neg Hx     Past Surgical History:  Procedure Laterality Date    . ABDOMINAL HYSTERECTOMY    . BACK SURGERY  2005   lower back  . EYE SURGERY Right 2006  . LIVER BIOPSY     fatty liver  . NASAL SEPTUM SURGERY    . ORIF ANKLE FRACTURE Right 01/11/2019   Procedure: OPEN REDUCTION INTERNAL FIXATION (ORIF) RIGHT BIMALLEOLAR ANKLE FRACTURE;  Surgeon: Leandrew Koyanagi, MD;  Location: [facility];  Service: Orthopedics;  Laterality: Right;  . right knee surgery  2006   arthroscopy  . right leg skin cancer surgery  2014  . right shoulder skin cancer excision  2012  . THYROIDECTOMY     Social History   Occupational History  . Occupation: Retired Water engineer  Tobacco Use  . Smoking status: Never Smoker  . Smokeless tobacco: Never Used  Substance and Sexual Activity  . Alcohol use: No  . Drug use: No  . Sexual activity: Not Currently    Comment: intercourser age 9, less than 5 secxual partners

## 2019-04-22 ENCOUNTER — Other Ambulatory Visit: Payer: Self-pay | Admitting: Family Medicine

## 2019-05-21 DIAGNOSIS — M316 Other giant cell arteritis: Secondary | ICD-10-CM | POA: Diagnosis not present

## 2019-05-21 DIAGNOSIS — M2550 Pain in unspecified joint: Secondary | ICD-10-CM | POA: Diagnosis not present

## 2019-05-21 DIAGNOSIS — Z79899 Other long term (current) drug therapy: Secondary | ICD-10-CM | POA: Diagnosis not present

## 2019-05-21 LAB — CBC AND DIFFERENTIAL
HCT: 40 (ref 36–46)
Hemoglobin: 13.1 (ref 12.0–16.0)
Platelets: 260 (ref 150–399)
WBC: 12.3

## 2019-05-21 LAB — BASIC METABOLIC PANEL
BUN: 15 (ref 4–21)
Creatinine: 0.9 (ref 0.5–1.1)

## 2019-05-21 LAB — HEPATIC FUNCTION PANEL
ALT: 31 (ref 7–35)
AST: 33 (ref 13–35)

## 2019-05-21 LAB — CBC: RBC: 4.31 (ref 3.87–5.11)

## 2019-05-25 ENCOUNTER — Telehealth: Payer: Self-pay

## 2019-05-25 NOTE — Telephone Encounter (Signed)
Patient saw Dr. Kayleen Memos (rheumatologist) 05/21/19. The nurse called today and said the patient's white blood count is elevated and that for her to call PCP to ask for an antibiotic for a UTI.

## 2019-05-25 NOTE — Telephone Encounter (Signed)
Pts daughter was called and told they would need to make appt and give a urine sample. Appt was scheduled by daughter for tomorrow morning.

## 2019-05-26 ENCOUNTER — Encounter: Payer: Self-pay | Admitting: Family Medicine

## 2019-05-26 ENCOUNTER — Other Ambulatory Visit: Payer: Self-pay

## 2019-05-26 ENCOUNTER — Ambulatory Visit (INDEPENDENT_AMBULATORY_CARE_PROVIDER_SITE_OTHER): Payer: Medicare Other | Admitting: Family Medicine

## 2019-05-26 VITALS — BP 148/74 | HR 86 | Temp 98.3°F | Resp 17 | Ht 63.0 in

## 2019-05-26 DIAGNOSIS — R8290 Unspecified abnormal findings in urine: Secondary | ICD-10-CM

## 2019-05-26 DIAGNOSIS — M353 Polymyalgia rheumatica: Secondary | ICD-10-CM

## 2019-05-26 DIAGNOSIS — F418 Other specified anxiety disorders: Secondary | ICD-10-CM

## 2019-05-26 DIAGNOSIS — R300 Dysuria: Secondary | ICD-10-CM | POA: Diagnosis not present

## 2019-05-26 DIAGNOSIS — H35033 Hypertensive retinopathy, bilateral: Secondary | ICD-10-CM | POA: Diagnosis not present

## 2019-05-26 DIAGNOSIS — M316 Other giant cell arteritis: Secondary | ICD-10-CM | POA: Diagnosis not present

## 2019-05-26 DIAGNOSIS — I10 Essential (primary) hypertension: Secondary | ICD-10-CM

## 2019-05-26 DIAGNOSIS — E782 Mixed hyperlipidemia: Secondary | ICD-10-CM | POA: Diagnosis not present

## 2019-05-26 DIAGNOSIS — J4540 Moderate persistent asthma, uncomplicated: Secondary | ICD-10-CM

## 2019-05-26 LAB — POCT URINALYSIS DIPSTICK
Bilirubin, UA: NEGATIVE
Blood, UA: NEGATIVE
Glucose, UA: NEGATIVE
Ketones, UA: NEGATIVE
Leukocytes, UA: NEGATIVE
Nitrite, UA: POSITIVE
Protein, UA: POSITIVE — AB
Spec Grav, UA: 1.025 (ref 1.010–1.025)
Urobilinogen, UA: 0.2 E.U./dL
pH, UA: 5.5 (ref 5.0–8.0)

## 2019-05-26 MED ORDER — BUDESONIDE-FORMOTEROL FUMARATE 80-4.5 MCG/ACT IN AERO: INHALATION_SPRAY | RESPIRATORY_TRACT | 12 refills | Status: DC

## 2019-05-26 MED ORDER — DIAZEPAM 5 MG PO TABS: ORAL_TABLET | ORAL | 5 refills | Status: DC

## 2019-05-26 MED ORDER — OLMESARTAN MEDOXOMIL 20 MG PO TABS: 30.0000 mg | ORAL_TABLET | Freq: Every day | ORAL | 1 refills | Status: DC

## 2019-05-26 MED ORDER — FLUOXETINE HCL 20 MG PO CAPS: 60.0000 mg | ORAL_CAPSULE | Freq: Every day | ORAL | 1 refills | Status: DC

## 2019-05-26 MED ORDER — CIPROFLOXACIN HCL 500 MG PO TABS: 500.0000 mg | ORAL_TABLET | Freq: Two times a day (BID) | ORAL | 0 refills | Status: DC

## 2019-05-26 MED ORDER — ALBUTEROL SULFATE HFA 108 (90 BASE) MCG/ACT IN AERS: INHALATION_SPRAY | RESPIRATORY_TRACT | 5 refills | Status: DC

## 2019-05-26 NOTE — Patient Instructions (Addendum)
COVID-19 Vaccine Information can be found at: [email or web address] For questions related to vaccine distribution or appointments, please email [email or web address] or call [phone].  Covid Vaccine appointment go to [email or web address].  I have refilled your medications for you.  I started you on cipro also- for 5 days total.

## 2019-05-26 NOTE — Progress Notes (Signed)
This visit occurred during the SARS-CoV-2 public health emergency.  Safety protocols were in place, including screening questions prior to the visit, additional usage of staff PPE, and extensive cleaning of exam room while observing appropriate contact time as indicated for disinfecting solutions.   Patient Care Team    Relationship Specialty Notifications Start End  Ma Hillock, DO PCP - General Family Medicine  03/16/15   Hermelinda Medicus, MD  Internal Medicine  07/11/15    Comment: Rheumotology  Fay Records, MD Consulting Physician Cardiology  07/11/15   Lafayette Dragon, MD (Inactive)  Gastroenterology  07/11/15   Tanda Rockers, MD Consulting Physician Pulmonary Disease  07/11/15   Jessy Oto, MD Consulting Physician Orthopedic Surgery  07/12/16   Magnus Sinning, MD Consulting Physician Physical Medicine and Rehabilitation  07/12/16   Center, Skin Surgery    07/15/17   [place]  Dentistry  07/15/17      SUBJECTIVE Chief Complaint  Patient presents with  . Dysuria    Pt has been having some memory issues and pain with urination. Pt has been drinking cranberry juice to help. Went to Rheum MD and they did urine dip but told her to call PCP for abx.    HPI: [name] is a 86 y.o. female present for  [facility] follow up and Acute concern. Hypertension/iron deficiency/hyperlipidemia: Pt reports  compliance with Benicar 30 mg QD. Blood pressures ranges at home are normal. Patient denies chest pain, shortness of breath, dizziness or lower extremity edema.  Pt taking daily baby ASA. Pt is intolerant of statin. BMP: 01/11/2019 GFR 51 CBC: Monitored routinely with her rheumatologist.  Requested records. TSH: 1.75 01/13/2018 Cholesterol: 12/2017 T284, H41, L129, tg 753 Diet: Low-sodium Exercise: Not routinely RF: Hypertension, hyperlipidemia, obesity, Fhx HD  Depression with anxiety: She reports she is still well on Prozac 60 mg Qd- we did try to decrease dose- but anxiety  increased.  She continues to take the Valium 2.5/5 mg occasionally.  She feels this is helped her immensely with sleeping and her anxiety.  Indication for chronic benzo: Valium- anxiety and lower back spasms Medication and dose: Valium 2.5 mg/5 mg # pills per month: #45 Last UDS date: next appt.  Pain contract signed (Y/N): Y Date narcotic database last reviewed (include red flags):05/27/19   Asthma: Patient reports  compliance with Symbicort 2 puffs twice a day.  Feels her asthma is well controlled  Dysuria: Patient reports she started to have burning with urination and urinary frequency a few weeks ago.  It has worsened over the last week.  She was seen at her rheumatology's office yesterday and complained of dysuria.  Her daughter also felt that she is starting to have her memory issues which she notices occurs when she is having her UTIs.  Urine collection was completed-results are not available.  Patient was told to follow-up with her primary care provider for an antibiotic  ROS: See pertinent positives and negatives per HPI.  Patient Active Problem List   Diagnosis Date Noted  . Bimalleolar ankle fracture, right, closed, initial encounter 01/06/2019  . Acute vulvitis 07/31/2018  . Mixed stress and urge urinary incontinence 09/04/2017  . Elevated hemoglobin ([place]) 04/04/2017  . Chronic back pain 01/28/2017  . Morbid obesity ([place]) 01/28/2017  . Peptic ulcer disease   . Lumbar radiculitis   . Frequent falls 08/02/2016  . Hypertensive retinopathy of both eyes 09/07/2015  . Medicare annual wellness visit, subsequent 07/11/2015  . Long-term use  of high-risk medication 07/11/2015  . Osteopenia 04/11/2015  . Depression with anxiety 04/11/2015  . Giant cell arteritis ([place]) 03/28/2015  . Polymyalgia rheumatica ([place]) 03/16/2015  . Chronic narcotic use 03/16/2015  . Anemia, iron deficiency 07/14/2013  . Asthma, moderate persistent 10/15/2012  . Hyperlipidemia 12/27/2008  . HYPERTENSION,  BENIGN 12/27/2008    Social History   Tobacco Use  . Smoking status: Never Smoker  . Smokeless tobacco: Never Used  Substance Use Topics  . Alcohol use: No    Current Outpatient Medications:  .  albuterol (PROAIR HFA) 108 (90 Base) MCG/ACT inhaler, 2 puffs up to every 4 hours if can't catch your breath, Disp: 8 g, Rfl: 5 .  aspirin EC 81 MG tablet, Take 1 tablet (81 mg total) by mouth 2 (two) times daily., Disp: 84 tablet, Rfl: 0 .  budesonide-formoterol (SYMBICORT) 80-4.5 MCG/ACT inhaler, USE 2 INHALATIONS TWICE A DAY, Disp: 30.6 g, Rfl: 12 .  calcium-vitamin D (OSCAL WITH D) 500-200 MG-UNIT tablet, Take 1 tablet by mouth 3 (three) times daily., Disp: 90 tablet, Rfl: 6 .  diazepam (VALIUM) 5 MG tablet, 1/2 tab PO in the morning and 1 tab QHS PRN., Disp: 45 tablet, Rfl: 5 .  FLUoxetine (PROZAC) 20 MG capsule, Take 3 capsules (60 mg total) by mouth daily., Disp: 270 capsule, Rfl: 1 .  olmesartan (BENICAR) 20 MG tablet, Take 1.5 tablets (30 mg total) by mouth daily., Disp: 135 tablet, Rfl: 1 .  omega-3 acid ethyl esters (LOVAZA) 1 g capsule, Take 2 capsules (2 g total) by mouth 2 (two) times daily., Disp: 360 capsule, Rfl: 3 .  omeprazole (PRILOSEC) 40 MG capsule, Take by mouth., Disp: , Rfl:  .  predniSONE (DELTASONE) 5 MG tablet, Take 5 mg by mouth daily. , Disp: , Rfl:  .  tocilizumab (ACTEMRA) 400 MG/20ML SOLN injection, Infuse 480 mg into the vein once every 28 days., Disp: , Rfl:  .  tocilizumab 4 mg/kg in sodium chloride 0.9 %, Inject 280 mg into the vein., Disp: , Rfl:  .  traMADol (ULTRAM) 50 MG tablet, TAKE ONE TABLET BY MOUTH EVERY 6 HOURS AS NEEDED FOR MODERATE PAIN, Disp: 40 tablet, Rfl: 0 .  triamcinolone (KENALOG) 0.025 % ointment, Apply a very thin layer to affected area every 2 days., Disp: 30 g, Rfl: 0 .  zinc sulfate 220 (50 Zn) MG capsule, Take 1 capsule (220 mg total) by mouth daily., Disp: 42 capsule, Rfl: 0 .  ciprofloxacin (CIPRO) 500 MG tablet, Take 1 tablet (500 mg  total) by mouth 2 (two) times daily., Disp: 20 tablet, Rfl: 0 .  magic mouthwash SOLN, Take 10-15 mLs by mouth 3 (three) times daily as needed for mouth pain., Disp: 240 mL, Rfl: 5  Allergies  Allergen Reactions  . Atorvastatin Other (See Comments)    Severe headache  . Oxycodone Itching    hallucinations  . Statins   . Advair Diskus [Fluticasone-Salmeterol] Other (See Comments)    Mouth sores  . Sulfonamide Derivatives Rash    OBJECTIVE: BP (!) 148/74 (BP Location: Right Arm, Patient Position: Sitting, Cuff Size: Normal)   Pulse 86   Temp 98.3 F (36.8 C) (Temporal)   Resp 17   Ht 5\' 3"  (1.6 m)   SpO2 95%   BMI 31.67 kg/m  Gen: Afebrile. No acute distress.  Nontoxic in presentation, well-developed, well-nourished, pleasant obese Caucasian female. HENT: AT. [place].  Eyes:Pupils Equal Round Reactive to light, Extraocular movements intact,  Conjunctiva without redness, discharge  or icterus. Neck/lymp/endocrine: Supple, no lymphadenopathy, no thyromegaly CV: RRR no murmur, no edema, +2/4 P posterior tibialis pulses Chest: CTAB, no wheeze or crackles Abd: Soft.  Obese. NTND. BS present.  No masses palpated.  Skin: No rashes, purpura or petechiae.  New abrasion left elbow.  Bleeding well controlled. Neuro: In wheelchair today.  PERLA. EOMi. Alert. Oriented x3  Psych: Normal affect, dress and demeanor. Normal speech. Normal thought content and judgment.   Results for orders placed or performed in visit on 05/26/19 (from the past 24 hour(s))  POCT urinalysis dipstick     Status: Abnormal   Collection Time: 05/26/19  9:45 AM  Result Value Ref Range   Color, UA dark yellow    Clarity, UA slightly cloudy    Glucose, UA Negative Negative   Bilirubin, UA negative    Ketones, UA negative    Spec Grav, UA 1.025 1.010 - 1.025   Blood, UA negative    pH, UA 5.5 5.0 - 8.0   Protein, UA Positive (A) Negative   Urobilinogen, UA 0.2 0.2 or 1.0 E.U./dL   Nitrite, UA positive    Leukocytes,  UA Negative Negative   Appearance     Odor      ASSESSMENT AND PLAN: Chrysanthemum Leckie is a 86 y.o. female present for  Essential hypertension, benign/hyperlipidemia/morbid obesity/hypertensive retinopathy of both eyes -Stable for age. -Continue Benicar 30 mg daily.  Has been intolerant to higher doses of Benicar and multiple other blood pressure regimens.   - Low-sodium diet - Attempt to stay as active as possible-having difficulty since her fall and fracture. - Continue baby aspirin - Intolerant to statins - Continue Lovaza    Moderate persistent asthma without complication Stable.   Continue Symbicort 2 puffs twice a day.   Continue albuterol 1 to 2 puffs every 6 hours as needed.   Depression with anxiety Stable.  Continue prozac 60 mg QD and valium.  Indication for chronic benzo: Valium- anxiety and lower back spasms Medication and dose: Valium 2.5 mg/5 mg [place] controlled substance database reviewed today and appropriate  Dysuria:  - POCT today for nitrites> urine culture sent - Started treatment with Cipro twice daily x5 days  Giant cell arteritis/polymyalgia rheumatica: Patient receives Actemra infusions routinely. She is prescribed low dose prednisone by Rheumatology. - She would like to see if there is a rheumatologist that performs these infusions more locally so she does not have to travel to [place] if possible. >> referral placed.   -Follow-up in 6 months on chronic medical conditions  Greater than 40 minutes was spent with patient today covering multiple chronic problems, medication management and new acute problem.  Orders Placed This Encounter  Procedures  . Urine Culture  . Ambulatory referral to Rheumatology  . POCT urinalysis dipstick   Meds ordered this encounter  Medications  . ciprofloxacin (CIPRO) 500 MG tablet    Sig: Take 1 tablet (500 mg total) by mouth 2 (two) times daily.    Dispense:  20 tablet    Refill:  0  . albuterol (PROAIR HFA)  108 (90 Base) MCG/ACT inhaler    Sig: 2 puffs up to every 4 hours if can't catch your breath    Dispense:  8 g    Refill:  5  . budesonide-formoterol (SYMBICORT) 80-4.5 MCG/ACT inhaler    Sig: USE 2 INHALATIONS TWICE A DAY    Dispense:  30.6 g    Refill:  12  . diazepam (VALIUM) 5 MG  tablet    Sig: 1/2 tab PO in the morning and 1 tab QHS PRN.    Dispense:  45 tablet    Refill:  5  . FLUoxetine (PROZAC) 20 MG capsule    Sig: Take 3 capsules (60 mg total) by mouth daily.    Dispense:  270 capsule    Refill:  1    Needs appt for additional refills.  Marland Kitchen olmesartan (BENICAR) 20 MG tablet    Sig: Take 1.5 tablets (30 mg total) by mouth daily.    Dispense:  135 tablet    Refill:  1    Needs appt for additional refills.  . magic mouthwash SOLN    Sig: Take 10-15 mLs by mouth 3 (three) times daily as needed for mouth pain.    Dispense:  240 mL    Refill:  5    Referral Orders     Ambulatory referral to Rheumatology    [facility], DO 05/27/2019

## 2019-05-27 MED ORDER — MAGIC MOUTHWASH: 10.0000 mL | Freq: Three times a day (TID) | ORAL | 5 refills | Status: DC | PRN

## 2019-05-28 LAB — URINE CULTURE
MICRO NUMBER:: 10236449
SPECIMEN QUALITY:: ADEQUATE

## 2019-06-01 ENCOUNTER — Encounter: Payer: Self-pay | Admitting: Family Medicine

## 2019-06-10 DIAGNOSIS — M810 Age-related osteoporosis without current pathological fracture: Secondary | ICD-10-CM | POA: Diagnosis not present

## 2019-06-10 DIAGNOSIS — Z7952 Long term (current) use of systemic steroids: Secondary | ICD-10-CM | POA: Diagnosis not present

## 2019-06-10 DIAGNOSIS — Z780 Asymptomatic menopausal state: Secondary | ICD-10-CM | POA: Diagnosis not present

## 2019-06-17 DIAGNOSIS — M316 Other giant cell arteritis: Secondary | ICD-10-CM | POA: Diagnosis not present

## 2019-07-15 DIAGNOSIS — M316 Other giant cell arteritis: Secondary | ICD-10-CM | POA: Diagnosis not present

## 2019-07-23 ENCOUNTER — Other Ambulatory Visit: Payer: Self-pay

## 2019-07-23 ENCOUNTER — Ambulatory Visit (INDEPENDENT_AMBULATORY_CARE_PROVIDER_SITE_OTHER): Payer: Medicare Other | Admitting: Family Medicine

## 2019-07-23 ENCOUNTER — Encounter: Payer: Self-pay | Admitting: Family Medicine

## 2019-07-23 VITALS — BP 157/66 | HR 71 | Temp 97.8°F | Ht 63.0 in | Wt 186.4 lb

## 2019-07-23 DIAGNOSIS — H60502 Unspecified acute noninfective otitis externa, left ear: Secondary | ICD-10-CM

## 2019-07-23 DIAGNOSIS — H6122 Impacted cerumen, left ear: Secondary | ICD-10-CM | POA: Diagnosis not present

## 2019-07-23 MED ORDER — NEOMYCIN-POLYMYXIN-HC 3.5-10000-1 OT SOLN: 4.0000 [drp] | Freq: Four times a day (QID) | OTIC | 0 refills | Status: AC

## 2019-07-23 MED ORDER — CORTISPORIN-TC 3.3-3-10-0.5 MG/ML OT SUSP: 3.0000 [drp] | Freq: Four times a day (QID) | OTIC | 0 refills | Status: DC

## 2019-07-23 MED ORDER — DEBROX 6.5 % OT SOLN: OTIC | 0 refills | Status: DC

## 2019-07-23 NOTE — Progress Notes (Signed)
This visit occurred during the SARS-CoV-2 public health emergency.  Safety protocols were in place, including screening questions prior to the visit, additional usage of staff PPE, and extensive cleaning of exam room while observing appropriate contact time as indicated for disinfecting solutions.    [name] , 08/22/1929, 86 y.o., female MRN: [id number] Patient Care Team    Relationship Specialty Notifications Start End  Ma Hillock, DO PCP - General Family Medicine  03/16/15   Hermelinda Medicus, MD  Internal Medicine  07/11/15    Comment: Rheumotology  Fay Records, MD Consulting Physician Cardiology  07/11/15   Lafayette Dragon, MD (Inactive)  Gastroenterology  07/11/15   Tanda Rockers, MD Consulting Physician Pulmonary Disease  07/11/15   Jessy Oto, MD Consulting Physician Orthopedic Surgery  07/12/16   Magnus Sinning, MD Consulting Physician Physical Medicine and Rehabilitation  07/12/16   Center, Skin Surgery    07/15/17   [place]  Dentistry  07/15/17     Chief Complaint  Patient presents with  . Ear Pain    L ear pain since yesterday. No drainage.      Subjective: Pt presents for an OV with complaints of left ear pain and sharp pain from ear to throat that started yesterday. She reports increase in allergy symptoms watery eyes, sneezing over the last week. She denies fever, chills, nausea or upper resp symptoms. She does wear hearing aids. Her son had poured a small amount of peroxide and mineral oil in her ear yesterday to help discomfort.   Depression screen [facility] 2/9 07/28/2018 01/13/2018 07/15/2017 07/15/2017 04/01/2017  Decreased Interest 0 - 0 0 0  Down, Depressed, Hopeless 1 1 0 0 0  PHQ - 2 Score 1 1 0 0 0  Altered sleeping 0 1 0 0 0  Tired, decreased energy 3 1 0 1 0  Change in appetite 0 0 0 0 0  Feeling bad or failure about yourself  1 0 0 0 0  Trouble concentrating 1 1 0 0 0  Moving slowly or fidgety/restless 0 0 0 0 0  Suicidal thoughts 0 0 0 0 0    PHQ-9 Score 6 4 0 1 0  Difficult doing work/chores Not difficult at all Somewhat difficult - - -    Allergies  Allergen Reactions  . Atorvastatin Other (See Comments)    Severe headache  . Oxycodone Itching    hallucinations  . Statins   . Advair Diskus [Fluticasone-Salmeterol] Other (See Comments)    Mouth sores  . 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    Lives with [name] and [name].   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   Patient takes a daily vitamin   She wears her seat belt. Exercises greater than 3 times a week.   Wears 2 hearing aids. No dentures.   "Sometimes "requires assistive device for walking, either a walker or wheelchair.   There is a smoke detector in her home. There are firearms in her home. She feels safe in her relationships.   Past Medical History:  Diagnosis Date  . Abdominal bloating   . Abnormal LFTs (liver function tests)   . Ankle fracture, bimalleolar, closed, right, initial encounter   . Anxiety disorder   . Arthritis   . Asthma   . Depression   . Diverticulosis   . Edema   . Fatty liver   . Fibromyalgia   .  GERD (gastroesophageal reflux disease)   . Giant cell arteritis (H[facility])    No biopsy secondary to steroid use  . Hx of adenomatous colonic polyps   . Hyperlipidemia   . Hypertension   . IBS (irritable bowel syndrome)   . Lumbar radiculitis   . Osteoporosis 09/2017   T score -2.6 left femoral neck  . Peptic ulcer disease   . Skin cancer    shoulder, leg, right eye (? squamous by resection)  . Thoracic radiculitis   . Thyroid nodule    "removed"  . Urinary incontinence   . Vertigo    Past Surgical History:  Procedure Laterality Date  . ABDOMINAL HYSTERECTOMY    . BACK SURGERY  2005   lower back  . EYE SURGERY Right 2006  . LIVER BIOPSY     fatty liver  . NASAL SEPTUM SURGERY    . ORIF ANKLE FRACTURE Right 01/11/2019   Procedure: OPEN REDUCTION  INTERNAL FIXATION (ORIF) RIGHT BIMALLEOLAR ANKLE FRACTURE;  Surgeon: Leandrew Koyanagi, MD;  Location: [facility];  Service: Orthopedics;  Laterality: Right;  . right knee surgery  2006   arthroscopy  . right leg skin cancer surgery  2014  . right shoulder skin cancer excision  2012  . THYROIDECTOMY     Family History  Problem Relation Age of Onset  . Colon polyps Maternal Aunt   . Diabetes Father   . Heart disease Father   . Heart disease Mother   . Lung disease Mother   . COPD Mother   . Lung cancer Brother   . Cancer Brother        lung  . COPD Brother   . Stroke Brother   . Lung cancer Sister   . COPD Sister   . Kidney disease Other        niece-mat side  . Arthritis Other   . Diabetes Son   . Arthritis Daughter   . Colon cancer Neg Hx    Allergies as of 07/23/2019      Reactions   Atorvastatin Other (See Comments)   Severe headache   Oxycodone Itching   hallucinations   Statins    Advair Diskus [fluticasone-salmeterol] Other (See Comments)   Mouth sores   Sulfonamide Derivatives Rash      Medication List       Accurate as of Jul 23, 2019 12:33 PM. If you have any questions, ask your nurse or doctor.        STOP taking these medications   ciprofloxacin 500 MG tablet Commonly known as: Cipro Stopped by: Howard Pouch, DO     TAKE these medications   albuterol 108 (90 Base) MCG/ACT inhaler Commonly known as: ProAir HFA 2 puffs up to every 4 hours if can't catch your breath   aspirin EC 81 MG tablet Take 1 tablet (81 mg total) by mouth 2 (two) times daily.   budesonide-formoterol 80-4.5 MCG/ACT inhaler Commonly known as: Symbicort USE 2 INHALATIONS TWICE A DAY   calcium-vitamin D 500-200 MG-UNIT tablet Commonly known as: OSCAL WITH D Take 1 tablet by mouth 3 (three) times daily.   Debrox 6.5 % OTIC solution Generic drug: carbamide peroxide Use once a month to keep cerumen form building up. Started by: Howard Pouch, DO   diazepam 5 MG  tablet Commonly known as: VALIUM 1/2 tab PO in the morning and 1 tab QHS PRN.   FLUoxetine 20 MG capsule Commonly known as: PROZAC Take 3 capsules (60  mg total) by mouth daily.   magic mouthwash Soln Take 10-15 mLs by mouth 3 (three) times daily as needed for mouth pain.   neomycin-polymyxin-hydrocortisone OTIC solution Commonly known as: CORTISPORIN Place 4 drops into the left ear 4 (four) times daily for 7 days. Started by: Howard Pouch, DO   olmesartan 20 MG tablet Commonly known as: BENICAR Take 1.5 tablets (30 mg total) by mouth daily.   omega-3 acid ethyl esters 1 g capsule Commonly known as: LOVAZA Take 2 capsules (2 g total) by mouth 2 (two) times daily.   omeprazole 40 MG capsule Commonly known as: PRILOSEC Take by mouth.   predniSONE 5 MG tablet Commonly known as: DELTASONE Take 5 mg by mouth daily. 15mg  daily   tocilizumab 4 mg/kg in sodium chloride 0.9 % Inject 280 mg into the vein.   tocilizumab 400 MG/20ML Soln injection Commonly known as: ACTEMRA Infuse 480 mg into the vein once every 28 days.   traMADol 50 MG tablet Commonly known as: ULTRAM TAKE ONE TABLET BY MOUTH EVERY 6 HOURS AS NEEDED FOR MODERATE PAIN   triamcinolone 0.025 % ointment Commonly known as: KENALOG Apply a very thin layer to affected area every 2 days.   zinc sulfate 220 (50 Zn) MG capsule Take 1 capsule (220 mg total) by mouth daily.       All past medical history, surgical history, allergies, family history, immunizations andmedications were updated in the EMR today and reviewed under the history and medication portions of their EMR.     ROS: Negative, with the exception of above mentioned in HPI   Objective:  BP (!) 157/66 (BP Location: Right Arm, Patient Position: Sitting, Cuff Size: Normal)   Pulse 71   Temp 97.8 F (36.6 C) (Temporal)   Ht 5\' 3"  (1.6 m)   Wt 186 lb 6 oz (84.5 kg)   SpO2 94%   BMI 33.01 kg/m  Body mass index is 33.01 kg/m. Gen: Afebrile. No  acute distress. Nontoxic in appearance, well developed, well nourished.  HENT: AT. [place]. Bilateral TM visualized right TM visualized and normal. Left TM unable to be visualized 2/2 to cerumen. MMM, no oral lesions. Bilateral Throat without erythema or exudates. No cough or shortness of breath. Eyes:Pupils Equal Round Reactive to light, Extraocular movements intact,  Conjunctiva without redness, discharge or icterus. Neck/lymp/endocrine: Supple,no lymphadenopathy Skin: no rashes, purpura or petechiae.  Neuro: PERLA. EOMi. Alert. Oriented x3 No exam data present No results found. No results found for this or any previous visit (from the past 24 hour(s)).  Assessment/Plan: [name] is a 86 y.o. female present for OV for  Impacted cerumen of left ear/ Acute otitis externa of left ear, unspecified type Procedure: Cerumen disimpaction Water-peroxide solution was applied and gentle ear lavage performed on Left ear(s).  There were no complications.  Tympanic membrane was visualized after disimpaction.  Tympanic membrane(s) intact.  Auditory canal(s) without erythema, mild swelling.  Patient tolerated procedure well.  Patient reported relief of symptoms after removal of cerumen. - start cortisporin OT drops x 7 days to treat poss. Otitis externa.  - debrox prescribed for once monthly maintenance of cerumen.  - continue allergy medication - F/U PRN    Reviewed expectations re: course of current medical issues.  Discussed self-management of symptoms.  Outlined signs and symptoms indicating need for more acute intervention.  Patient verbalized understanding and all questions were answered.  Patient received an After-Visit Summary.    No orders of the defined types  were placed in this encounter.  Meds ordered this encounter  Medications  . DISCONTD: neomycin-colistin-hydrocortisone-thonzonium (CORTISPORIN-TC) 3.05-18-08-0.5 MG/ML OTIC suspension    Sig: Place 3 drops into the left ear 4  (four) times daily.    Dispense:  10 mL    Refill:  0  . carbamide peroxide (DEBROX) 6.5 % OTIC solution    Sig: Use once a month to keep cerumen form building up.    Dispense:  15 mL    Refill:  0  . neomycin-polymyxin-hydrocortisone (CORTISPORIN) OTIC solution    Sig: Place 4 drops into the left ear 4 (four) times daily for 7 days.    Dispense:  10 mL    Refill:  0    DC prior cortisporin TC formulation. thanks   Referral Orders  No referral(s) requested today     Note is dictated utilizing voice recognition software. Although note has been proof read prior to signing, occasional typographical errors still can be missed. If any questions arise, please do not hesitate to call for verification.   electronically signed by:  Howard Pouch, DO  [place]

## 2019-07-23 NOTE — Patient Instructions (Addendum)
Start cortisporin ear drops 3-4 drops left ear for 7 days> these will help treat any infection and calm down the irritation in the canal from the wax being stuck that far down your ear.  Debrox solution once a month can help keep cerumen to a minimum. These has also been called in for maintenance.     Otitis Externa  Otitis externa is an infection of the outer ear canal. The outer ear canal is the area between the outside of the ear and the eardrum. Otitis externa is sometimes called swimmer's ear. What are the causes? Common causes of this condition include:  Swimming in dirty water.  Moisture in the ear.  An injury to the inside of the ear.  An object stuck in the ear.  A cut or scrape on the outside of the ear. What increases the risk? You are more likely to get this condition if you go swimming often. What are the signs or symptoms?  Itching in the ear. This is often the first symptom.  Swelling of the ear.  Redness in the ear.  Ear pain. The pain may get worse when you pull on your ear.  Pus coming from the ear. How is this treated? This condition may be treated with:  Antibiotic ear drops. These are often given for 10-14 days.  Medicines to reduce itching and swelling. Follow these instructions at home:  If you were given antibiotic ear drops, use them as told by your doctor. Do not stop using them even if your condition gets better.  Take over-the-counter and prescription medicines only as told by your doctor.  Avoid getting water in your ears as told by your doctor. You may be told to avoid swimming or water sports for a few days.  Keep all follow-up visits as told by your doctor. This is important. How is this prevented?  Keep your ears dry. Use the corner of a towel to dry your ears after you swim or bathe.  Try not to scratch or put things in your ear. Doing these things makes it easier for germs to grow in your ear.  Avoid swimming in lakes, dirty  water, or pools that may not have the right amount of a chemical called chlorine. Contact a doctor if:  You have a fever.  Your ear is still red, swollen, or painful after 3 days.  You still have pus coming from your ear after 3 days.  Your redness, swelling, or pain gets worse.  You have a really bad headache.  You have redness, swelling, pain, or tenderness behind your ear. Summary  Otitis externa is an infection of the outer ear canal.  Symptoms include pain, redness, and swelling of the ear.  If you were given antibiotic ear drops, use them as told by your doctor. Do not stop using them even if your condition gets better.  Try not to scratch or put things in your ear. This information is not intended to replace advice given to you by your health care provider. Make sure you discuss any questions you have with your health care provider. Document Revised: 08/08/2017 Document Reviewed: 08/08/2017 Elsevier Patient Education  [place], Adult The ears produce a substance called earwax that helps keep bacteria out of the ear and protects the skin in the ear canal. Occasionally, earwax can build up in the ear and cause discomfort or hearing loss. What increases the risk? This condition is more likely to develop in  people who:  Are female.  Are elderly.  Naturally produce more earwax.  Clean their ears often with cotton swabs.  Use earplugs often.  Use in-ear headphones often.  Wear hearing aids.  Have narrow ear canals.  Have earwax that is overly thick or sticky.  Have eczema.  Are dehydrated.  Have excess hair in the ear canal. What are the signs or symptoms? Symptoms of this condition include:  Reduced or muffled hearing.  A feeling of fullness in the ear or feeling that the ear is plugged.  Fluid coming from the ear.  Ear pain.  Ear itch.  Ringing in the ear.  Coughing.  An obvious piece of earwax that can be seen inside  the ear canal. How is this diagnosed? This condition may be diagnosed based on:  Your symptoms.  Your medical history.  An ear exam. During the exam, your health care provider will look into your ear with an instrument called an otoscope. You may have tests, including a hearing test. How is this treated? This condition may be treated by:  Using ear drops to soften the earwax.  Having the earwax removed by a health care provider. The health care provider may: ? Flush the ear with water. ? Use an instrument that has a loop on the end (curette). ? Use a suction device.  Surgery to remove the wax buildup. This may be done in severe cases. Follow these instructions at home:   Take over-the-counter and prescription medicines only as told by your health care provider.  Do not put any objects, including cotton swabs, into your ear. You can clean the opening of your ear canal with a washcloth or facial tissue.  Follow instructions from your health care provider about cleaning your ears. Do not over-clean your ears.  Drink enough fluid to keep your urine clear or pale yellow. This will help to thin the earwax.  Keep all follow-up visits as told by your health care provider. If earwax builds up in your ears often or if you use hearing aids, consider seeing your health care provider for routine, preventive ear cleanings. Ask your health care provider how often you should schedule your cleanings.  If you have hearing aids, clean them according to instructions from the manufacturer and your health care provider. Contact a health care provider if:  You have ear pain.  You develop a fever.  You have blood, pus, or other fluid coming from your ear.  You have hearing loss.  You have ringing in your ears that does not go away.  Your symptoms do not improve with treatment.  You feel like the room is spinning (vertigo). Summary  Earwax can build up in the ear and cause discomfort or  hearing loss.  The most common symptoms of this condition include reduced or muffled hearing and a feeling of fullness in the ear or feeling that the ear is plugged.  This condition may be diagnosed based on your symptoms, your medical history, and an ear exam.  This condition may be treated by using ear drops to soften the earwax or by having the earwax removed by a health care provider.  Do not put any objects, including cotton swabs, into your ear. You can clean the opening of your ear canal with a washcloth or facial tissue. This information is not intended to replace advice given to you by your health care provider. Make sure you discuss any questions you have with your health care provider.  Document Revised: 02/14/2017 Document Reviewed: 05/15/2016 Elsevier Patient Education  2020 [employer].

## 2019-08-06 NOTE — Progress Notes (Signed)
Office Visit Note  Patient: [name]             Date of Birth: 08/02/1929           MRN: [id number]             PCP: Ma Hillock, DO Referring: Ma Hillock, DO Visit Date: 08/18/2019 Occupation: @GUAROCC @  Subjective:  New Patient (Initial Visit) (Abnormal labs, second opinion)   History of Present Illness: [name] is a 86 y.o. female came here for a second opinion.  She was accompanied by her daughter.  According to her daughter that her mother was diagnosed with fibromyalgia many years ago due to generalized pain and discomfort.  She also have disc disease of lumbar spine for which she had surgery.  She continues to have chronic lower back pain.  She goes to pain management.  About 3 years ago she was diagnosed with polymyalgia rheumatica and giant cell arteritis by Dr. Earnest Conroy in [facility].  She has been on Actemra infusions for the last 2 years which has helped her joint symptoms.  They wanted to know how long she will continue on Actemra infusions.  They were also concerned about her diagnosis and the treatment.  She has some stiffness in her hands and wrist joints.  She has constant lower back pain.  None of the other joints are painful.  She states that she has been on Actemra her symptoms have improved a lot.  She denies any headaches.  Activities of Daily Living:  Patient reports morning stiffness for 15 minutes.   Patient Denies nocturnal pain.  Difficulty dressing/grooming: Reports Difficulty climbing stairs: Denies Difficulty getting out of chair: Reports Difficulty using hands for taps, buttons, cutlery, and/or writing: Reports  Review of Systems  Constitutional: Positive for fatigue. Negative for night sweats, weight gain and weight loss.  HENT: Positive for mouth dryness. Negative for mouth sores, trouble swallowing, trouble swallowing and nose dryness.   Eyes: Positive for dryness. Negative for pain, redness and visual disturbance.  Respiratory: Positive  for shortness of breath. Negative for cough and difficulty breathing.   Cardiovascular: Positive for swelling in legs/feet. Negative for chest pain, palpitations, hypertension and irregular heartbeat.  Gastrointestinal: Positive for constipation and diarrhea. Negative for blood in stool.  Endocrine: Positive for cold intolerance, heat intolerance, excessive thirst and increased urination.  Genitourinary: Positive for difficulty urinating, painful urination and involuntary urination. Negative for vaginal dryness.  Musculoskeletal: Positive for arthralgias, gait problem, joint pain, joint swelling, muscle weakness, morning stiffness and muscle tenderness. Negative for myalgias and myalgias.  Skin: Negative for color change, rash, hair loss, skin tightness, ulcers and sensitivity to sunlight.  Allergic/Immunologic: Positive for susceptible to infections.  Neurological: Positive for tremors and numbness. Negative for dizziness, memory loss, night sweats and weakness.  Hematological: Positive for bruising/bleeding tendency. Negative for swollen glands.  Psychiatric/Behavioral: Positive for sleep disturbance. Negative for depressed mood. The patient is not nervous/anxious.     PMFS History:  Patient Active Problem List   Diagnosis Date Noted  . Bimalleolar ankle fracture, right, closed, initial encounter 01/06/2019  . Acute vulvitis 07/31/2018  . Mixed stress and urge urinary incontinence 09/04/2017  . Elevated hemoglobin ([place]) 04/04/2017  . Chronic back pain 01/28/2017  . Morbid obesity ([place]) 01/28/2017  . Peptic ulcer disease   . Lumbar radiculitis   . Frequent falls 08/02/2016  . Hypertensive retinopathy of both eyes 09/07/2015  . Medicare annual wellness visit, subsequent 07/11/2015  .  Long-term use of high-risk medication 07/11/2015  . Osteopenia 04/11/2015  . Depression with anxiety 04/11/2015  . Giant cell arteritis ([place]) 03/28/2015  . Polymyalgia rheumatica ([place]) 03/16/2015  .  Chronic narcotic use 03/16/2015  . Anemia, iron deficiency 07/14/2013  . Asthma, moderate persistent 10/15/2012  . Hyperlipidemia 12/27/2008  . HYPERTENSION, BENIGN 12/27/2008    Past Medical History:  Diagnosis Date  . Abdominal bloating   . Abnormal LFTs (liver function tests)   . Ankle fracture, bimalleolar, closed, right, initial encounter   . Anxiety disorder   . Arthritis   . Asthma   . Depression   . Diverticulosis   . Edema   . Fatty liver   . Fibromyalgia   . GERD (gastroesophageal reflux disease)   . Giant cell arteritis (HCC)    No biopsy secondary to steroid use  . Hx of adenomatous colonic polyps   . Hyperlipidemia   . Hypertension   . IBS (irritable bowel syndrome)   . Lumbar radiculitis   . Osteoporosis 09/2017   T score -2.6 left femoral neck  . Peptic ulcer disease   . Skin cancer    shoulder, leg, right eye (? squamous by resection)  . Thoracic radiculitis   . Thyroid nodule    "removed"  . Urinary incontinence   . Vertigo     Family History  Problem Relation Age of Onset  . Colon polyps Maternal Aunt   . Diabetes Father   . Heart disease Father   . Heart disease Mother   . Lung disease Mother   . COPD Mother   . Lung cancer Brother   . Cancer Brother        lung  . COPD Brother   . Stroke Brother   . Lung cancer Sister   . COPD Sister   . Kidney disease Other        niece-mat side  . Arthritis Other   . Diabetes Son   . Arthritis Daughter   . Colon cancer Neg Hx    Past Surgical History:  Procedure Laterality Date  . ABDOMINAL HYSTERECTOMY    . BACK SURGERY  2005   lower back  . EYE SURGERY Right 2006  . LIVER BIOPSY     fatty liver  . NASAL SEPTUM SURGERY    . ORIF ANKLE FRACTURE Right 01/11/2019   Procedure: OPEN REDUCTION INTERNAL FIXATION (ORIF) RIGHT BIMALLEOLAR ANKLE FRACTURE;  Surgeon: Leandrew Koyanagi, MD;  Location: [facility];  Service: Orthopedics;  Laterality: Right;  . right knee surgery  2006    arthroscopy  . right leg skin cancer surgery  2014  . right shoulder skin cancer excision  2012  . SKIN CANCER EXCISION    . 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    Lives with [name] and [name].   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   Patient takes a daily vitamin   She wears her seat belt. Exercises greater than 3 times a week.   Wears 2 hearing aids. No dentures.   "Sometimes "requires assistive device for walking, either a walker or wheelchair.   There is a smoke detector in her home. There are firearms in her home. She feels safe in her relationships.   Immunization History  Administered Date(s) Administered  . Fluad Quad(high Dose 65+) 12/22/2018  . Influenza Split 12/16/2012, 12/30/2013  . Influenza Whole 12/17/2011  . Influenza, High  Dose Seasonal PF 12/12/2015, 12/24/2016, 01/01/2018  . Pneumococcal Conjugate-13 12/17/2010  . Pneumococcal Polysaccharide-23 06/29/2008, 12/30/2013  . Tdap 04/11/2015  . Zoster Recombinat (Shingrix) 09/09/2017, 01/01/2018     Objective: Vital Signs: BP (!) 167/62 (BP Location: Right Arm, Patient Position: Sitting, Cuff Size: Normal)   Pulse 73   Resp 18   Ht 5\' 3"  (1.6 m)   Wt 191 lb 12.8 oz (87 kg)   BMI 33.98 kg/m    Physical Exam Vitals and nursing note reviewed.  Constitutional:      Appearance: She is well-developed.  HENT:     Head: Normocephalic and atraumatic.  Eyes:     Conjunctiva/sclera: Conjunctivae normal.  Cardiovascular:     Rate and Rhythm: Normal rate and regular rhythm.     Heart sounds: Normal heart sounds.  Pulmonary:     Effort: Pulmonary effort is normal.     Breath sounds: Normal breath sounds.  Abdominal:     General: Bowel sounds are normal.     Palpations: Abdomen is soft.  Musculoskeletal:     Cervical back: Normal range of motion.  Lymphadenopathy:     Cervical: No cervical adenopathy.  Skin:    General: Skin is warm and dry.      Capillary Refill: Capillary refill takes less than 2 seconds.  Neurological:     Mental Status: She is alert and oriented to person, place, and time.  Psychiatric:        Behavior: Behavior normal.      Musculoskeletal Exam: She has limited range of motion of her cervical spine.  She has very painful limited range of motion of her lumbar spine.  Shoulder joints and elbow joints with good range of motion.  She has some extensor tenosynovitis of the bilateral wrist joints.  PIP and DIP thickening was noted.  No MCP synovitis was noted.  Knee joints with good range of motion.  Good range of motion of her left ankle joint.  She had surgery on her right ankle joint which is thickened.  PIP and DIP thickening was noted.  CDAI Exam: CDAI Score: -- Patient Global: --; Provider Global: -- Swollen: --; Tender: -- Joint Exam 08/18/2019   No joint exam has been documented for this visit   There is currently no information documented on the homunculus. Go to the Rheumatology activity and complete the homunculus joint exam.  Investigation: No additional findings.  Imaging: No results found.  Recent Labs: Lab Results  Component Value Date   WBC 12.3 05/21/2019   HGB 13.1 05/21/2019   PLT 260 05/21/2019   NA 141 01/11/2019   K 3.8 01/11/2019   CL 105 01/11/2019   CO2 25 01/11/2019   GLUCOSE 122 (H) 01/11/2019   BUN 15 05/21/2019   CREATININE 0.9 05/21/2019   BILITOT 0.4 05/20/2017   ALKPHOS 55 05/20/2017   AST 33 05/21/2019   ALT 31 05/21/2019   PROT 6.6 05/20/2017   ALBUMIN 4.4 05/20/2017   CALCIUM 9.5 01/11/2019   GFRAA 59 (L) 01/11/2019    Speciality Comments: No specialty comments available.  Procedures:  No procedures performed Allergies: Atorvastatin, Oxycodone, Statins, Advair diskus [fluticasone-salmeterol], and Sulfonamide derivatives   Assessment / Plan:     Visit Diagnoses: Polymyalgia rheumatica (HCC)-patient gives history of generalized pain, proximal muscle  weakness and tenderness few years ago.  On chart review her C-reactive protein was high in 2019 which improved gradually.  She is a still on prednisone 5 mg a day.  She is aware that she will be tapering it gradually.  Giant cell arteritis (HCC)-patient presented with headaches few years ago to Dr. Earnest Conroy.  She was started on steroids and Actemra infusions.  She has been taking Actemra infusions.  She had no temporal artery tenderness on examination today.  I agree with the current treatment based on the history.  I do not have any further to add.  Have advised her to follow-up with Dr. Earnest Conroy.  Fibromyalgia-she continues to have generalized pain discomfort and hyperalgesia.  Lumbar radiculitis-patient states he had lumbar spine surgery without much difference points.  She has severe pain in the lower back and has been going to pain management.  Other medical problems are listed as follows:  Essential hypertension  Moderate persistent asthma without complication  History of diverticulosis  History of IBS  Fatty liver  History of gastroesophageal reflux (GERD)  PUD (peptic ulcer disease)  Thyroid nodule  History of hyperlipidemia  Adjustment reaction with anxiety and depression  Hypertensive retinopathy of both eyes  Chronic narcotic use  Orders: No orders of the defined types were placed in this encounter.  No orders of the defined types were placed in this encounter.     Follow-Up Instructions: No follow-ups on file.   Bo Merino, MD  Note - This record has been created using Editor, commissioning.  Chart creation errors have been sought, but may not always  have been located. Such creation errors do not reflect on  the standard of medical care.

## 2019-08-12 DIAGNOSIS — M2550 Pain in unspecified joint: Secondary | ICD-10-CM | POA: Diagnosis not present

## 2019-08-12 DIAGNOSIS — M316 Other giant cell arteritis: Secondary | ICD-10-CM | POA: Diagnosis not present

## 2019-08-12 DIAGNOSIS — Z79899 Other long term (current) drug therapy: Secondary | ICD-10-CM | POA: Diagnosis not present

## 2019-08-18 ENCOUNTER — Encounter: Payer: Self-pay | Admitting: Family Medicine

## 2019-08-18 ENCOUNTER — Ambulatory Visit (INDEPENDENT_AMBULATORY_CARE_PROVIDER_SITE_OTHER): Payer: Medicare Other | Admitting: Rheumatology

## 2019-08-18 ENCOUNTER — Encounter: Payer: Self-pay | Admitting: Rheumatology

## 2019-08-18 ENCOUNTER — Other Ambulatory Visit: Payer: Self-pay

## 2019-08-18 ENCOUNTER — Ambulatory Visit (INDEPENDENT_AMBULATORY_CARE_PROVIDER_SITE_OTHER): Payer: Medicare Other | Admitting: Family Medicine

## 2019-08-18 VITALS — BP 167/62 | HR 73 | Resp 18 | Ht 63.0 in | Wt 191.8 lb

## 2019-08-18 VITALS — BP 142/75 | HR 73 | Temp 98.2°F | Resp 19 | Ht 63.0 in

## 2019-08-18 DIAGNOSIS — K279 Peptic ulcer, site unspecified, unspecified as acute or chronic, without hemorrhage or perforation: Secondary | ICD-10-CM

## 2019-08-18 DIAGNOSIS — R5383 Other fatigue: Secondary | ICD-10-CM

## 2019-08-18 DIAGNOSIS — F4323 Adjustment disorder with mixed anxiety and depressed mood: Secondary | ICD-10-CM

## 2019-08-18 DIAGNOSIS — M797 Fibromyalgia: Secondary | ICD-10-CM | POA: Diagnosis not present

## 2019-08-18 DIAGNOSIS — I10 Essential (primary) hypertension: Secondary | ICD-10-CM | POA: Diagnosis not present

## 2019-08-18 DIAGNOSIS — E782 Mixed hyperlipidemia: Secondary | ICD-10-CM

## 2019-08-18 DIAGNOSIS — M353 Polymyalgia rheumatica: Secondary | ICD-10-CM

## 2019-08-18 DIAGNOSIS — Z8719 Personal history of other diseases of the digestive system: Secondary | ICD-10-CM

## 2019-08-18 DIAGNOSIS — R413 Other amnesia: Secondary | ICD-10-CM

## 2019-08-18 DIAGNOSIS — M5416 Radiculopathy, lumbar region: Secondary | ICD-10-CM | POA: Diagnosis not present

## 2019-08-18 DIAGNOSIS — J4540 Moderate persistent asthma, uncomplicated: Secondary | ICD-10-CM

## 2019-08-18 DIAGNOSIS — E041 Nontoxic single thyroid nodule: Secondary | ICD-10-CM

## 2019-08-18 DIAGNOSIS — F1190 Opioid use, unspecified, uncomplicated: Secondary | ICD-10-CM

## 2019-08-18 DIAGNOSIS — K760 Fatty (change of) liver, not elsewhere classified: Secondary | ICD-10-CM

## 2019-08-18 DIAGNOSIS — R300 Dysuria: Secondary | ICD-10-CM | POA: Diagnosis not present

## 2019-08-18 DIAGNOSIS — M810 Age-related osteoporosis without current pathological fracture: Secondary | ICD-10-CM

## 2019-08-18 DIAGNOSIS — H35033 Hypertensive retinopathy, bilateral: Secondary | ICD-10-CM

## 2019-08-18 DIAGNOSIS — Z8639 Personal history of other endocrine, nutritional and metabolic disease: Secondary | ICD-10-CM | POA: Diagnosis not present

## 2019-08-18 DIAGNOSIS — M316 Other giant cell arteritis: Secondary | ICD-10-CM

## 2019-08-18 DIAGNOSIS — R8290 Unspecified abnormal findings in urine: Secondary | ICD-10-CM

## 2019-08-18 LAB — COMPREHENSIVE METABOLIC PANEL
ALT: 29 U/L (ref 0–35)
AST: 28 U/L (ref 0–37)
Albumin: 4 g/dL (ref 3.5–5.2)
Alkaline Phosphatase: 55 U/L (ref 39–117)
BUN: 23 mg/dL (ref 6–23)
CO2: 29 mEq/L (ref 19–32)
Calcium: 9 mg/dL (ref 8.4–10.5)
Chloride: 103 mEq/L (ref 96–112)
Creatinine, Ser: 0.87 mg/dL (ref 0.40–1.20)
GFR: 61.42 mL/min (ref >60.00)
Glucose, Bld: 109 mg/dL — ABNORMAL HIGH (ref 70–99)
Potassium: 4.3 mEq/L (ref 3.5–5.1)
Sodium: 137 mEq/L (ref 135–145)
Total Bilirubin: 0.3 mg/dL (ref 0.2–1.2)
Total Protein: 5.9 g/dL — ABNORMAL LOW (ref 6.0–8.3)

## 2019-08-18 LAB — POCT URINALYSIS DIPSTICK
Bilirubin, UA: NEGATIVE
Blood, UA: NEGATIVE
Glucose, UA: NEGATIVE
Ketones, UA: NEGATIVE
Leukocytes, UA: NEGATIVE
Nitrite, UA: POSITIVE
Protein, UA: POSITIVE — AB
Spec Grav, UA: 1.02 (ref 1.010–1.025)
Urobilinogen, UA: 0.2 E.U./dL
pH, UA: 6 (ref 5.0–8.0)

## 2019-08-18 LAB — CBC WITH DIFFERENTIAL/PLATELET
Basophils Absolute: 0 10*3/uL (ref 0.0–0.1)
Basophils Relative: 0.5 % (ref 0.0–3.0)
Eosinophils Absolute: 0.1 10*3/uL (ref 0.0–0.7)
Eosinophils Relative: 1 % (ref 0.0–5.0)
HCT: 42.7 % (ref 36.0–46.0)
Hemoglobin: 14.4 g/dL (ref 12.0–15.0)
Lymphocytes Relative: 12.4 % (ref 12.0–46.0)
Lymphs Abs: 1 10*3/uL (ref 0.7–4.0)
MCHC: 33.7 g/dL (ref 30.0–36.0)
MCV: 94.2 fl (ref 78.0–100.0)
Monocytes Absolute: 0.3 10*3/uL (ref 0.1–1.0)
Monocytes Relative: 4.3 % (ref 3.0–12.0)
Neutro Abs: 6.4 10*3/uL (ref 1.4–7.7)
Neutrophils Relative %: 81.8 % — ABNORMAL HIGH (ref 43.0–77.0)
Platelets: 181 10*3/uL (ref 150.0–400.0)
RBC: 4.54 Mil/uL (ref 3.87–5.11)
RDW: 15.7 % — ABNORMAL HIGH (ref 11.5–15.5)
WBC: 7.8 10*3/uL (ref 4.0–10.5)

## 2019-08-18 LAB — TSH: TSH: 1.23 u[IU]/mL (ref 0.35–4.50)

## 2019-08-18 LAB — VITAMIN B12: Vitamin B-12: 308 pg/mL (ref 211–911)

## 2019-08-18 LAB — LDL CHOLESTEROL, DIRECT: Direct LDL: 157 mg/dL

## 2019-08-18 LAB — VITAMIN D 25 HYDROXY (VIT D DEFICIENCY, FRACTURES): VITD: 27.14 ng/mL — ABNORMAL LOW (ref 30.00–100.00)

## 2019-08-18 MED ORDER — CEFDINIR 300 MG PO CAPS
300.0000 mg | ORAL_CAPSULE | Freq: Two times a day (BID) | ORAL | 0 refills | Status: DC
Start: 2019-08-18 — End: 2020-01-17

## 2019-08-18 MED ORDER — CEFTRIAXONE SODIUM 1 G IJ SOLR
1.0000 g | Freq: Once | INTRAMUSCULAR | Status: AC
  Administered 2019-08-18: 1 g via INTRAMUSCULAR

## 2019-08-18 NOTE — Progress Notes (Signed)
This visit occurred during the SARS-CoV-2 public health emergency.  Safety protocols were in place, including screening questions prior to the visit, additional usage of staff PPE, and extensive cleaning of exam room while observing appropriate contact time as indicated for disinfecting solutions.   Patient Care Team    Relationship Specialty Notifications Start End  Ma Hillock, DO PCP - General Family Medicine  03/16/15   Hermelinda Medicus, MD  Internal Medicine  07/11/15    Comment: Rheumotology  Fay Records, MD Consulting Physician Cardiology  07/11/15   Lafayette Dragon, MD (Inactive)  Gastroenterology  07/11/15   Tanda Rockers, MD Consulting Physician Pulmonary Disease  07/11/15   Jessy Oto, MD Consulting Physician Orthopedic Surgery  07/12/16   Magnus Sinning, MD Consulting Physician Physical Medicine and Rehabilitation  07/12/16   [facility]    07/15/17   [facility]  07/15/17      SUBJECTIVE Chief Complaint  Patient presents with   Urinary Tract Infection    Pt ia having burning with urination. Memory issues per daughter. Back pain. Urinary frequnecy.    HPI: [name] is a 86 y.o. female present for dysuria Dysuria: Patient reports she started to have burning with urination and urinary frequency  2 days ago. She endorses incontinence today. Her daughter also feels that she is starting to have her memory issues which she notices occurs when she is having her UTIs. Patient reports she is very fatigued and had pressure and pain in her lower adb.  Pan sensitive E.Coli 05/26/2019. Treated with cipro.   ROS: See pertinent positives and negatives per HPI.  Patient Active Problem List   Diagnosis Date Noted   Bimalleolar ankle fracture, right, closed, initial encounter 01/06/2019   Acute vulvitis 07/31/2018   Mixed stress and urge urinary incontinence 09/04/2017   Elevated hemoglobin (HCC) 04/04/2017   Chronic back pain 01/28/2017   Morbid  obesity ([place]) 01/28/2017   Peptic ulcer disease    Lumbar radiculitis    Frequent falls 08/02/2016   Hypertensive retinopathy of both eyes 09/07/2015   Medicare annual wellness visit, subsequent 07/11/2015   Long-term use of high-risk medication 07/11/2015   Osteopenia 04/11/2015   Depression with anxiety 04/11/2015   Giant cell arteritis ([place]) 03/28/2015   Polymyalgia rheumatica ([place]) 03/16/2015   Chronic narcotic use 03/16/2015   Anemia, iron deficiency 07/14/2013   Asthma, moderate persistent 10/15/2012   Hyperlipidemia 12/27/2008   HYPERTENSION, BENIGN 12/27/2008    Social History   Tobacco Use   Smoking status: Never Smoker   Smokeless tobacco: Never Used  Substance Use Topics   Alcohol use: No    Current Outpatient Medications:    albuterol (PROAIR HFA) 108 (90 Base) MCG/ACT inhaler, 2 puffs up to every 4 hours if can't catch your breath, Disp: 8 g, Rfl: 5   alendronate (FOSAMAX) 70 MG tablet, Take by mouth., Disp: , Rfl:    aspirin EC 81 MG tablet, Take 1 tablet (81 mg total) by mouth 2 (two) times daily., Disp: 84 tablet, Rfl: 0   budesonide-formoterol (SYMBICORT) 80-4.5 MCG/ACT inhaler, USE 2 INHALATIONS TWICE A DAY, Disp: 30.6 g, Rfl: 12   calcium-vitamin D (OSCAL WITH D) 500-200 MG-UNIT tablet, Take 1 tablet by mouth 3 (three) times daily., Disp: 90 tablet, Rfl: 6   carbamide peroxide (DEBROX) 6.5 % OTIC solution, Use once a month to keep cerumen form building up., Disp: 15 mL, Rfl: 0   diazepam (VALIUM) 5 MG  tablet, 1/2 tab PO in the morning and 1 tab QHS PRN., Disp: 45 tablet, Rfl: 5   FLUoxetine (PROZAC) 20 MG capsule, Take 3 capsules (60 mg total) by mouth daily., Disp: 270 capsule, Rfl: 1   magic mouthwash SOLN, Take 10-15 mLs by mouth 3 (three) times daily as needed for mouth pain., Disp: 240 mL, Rfl: 5   olmesartan (BENICAR) 20 MG tablet, Take 1.5 tablets (30 mg total) by mouth daily., Disp: 135 tablet, Rfl: 1   omega-3 acid ethyl  esters (LOVAZA) 1 g capsule, Take 2 capsules (2 g total) by mouth 2 (two) times daily., Disp: 360 capsule, Rfl: 3   omeprazole (PRILOSEC) 40 MG capsule, Take by mouth., Disp: , Rfl:    predniSONE (DELTASONE) 5 MG tablet, Take 5 mg by mouth daily. 66m daily, Disp: , Rfl:    tocilizumab (ACTEMRA) 400 MG/20ML SOLN injection, Infuse 480 mg into the vein once every 28 days., Disp: , Rfl:    tocilizumab 4 mg/kg in sodium chloride 0.9 %, Inject 280 mg into the vein., Disp: , Rfl:    traMADol (ULTRAM) 50 MG tablet, TAKE ONE TABLET BY MOUTH EVERY 6 HOURS AS NEEDED FOR MODERATE PAIN, Disp: 40 tablet, Rfl: 0   triamcinolone (KENALOG) 0.025 % ointment, Apply a very thin layer to affected area every 2 days., Disp: 30 g, Rfl: 0   zinc sulfate 220 (50 Zn) MG capsule, Take 1 capsule (220 mg total) by mouth daily., Disp: 42 capsule, Rfl: 0   cefdinir (OMNICEF) 300 MG capsule, Take 1 capsule (300 mg total) by mouth 2 (two) times daily., Disp: 14 capsule, Rfl: 0  Allergies  Allergen Reactions   Atorvastatin Other (See Comments)    Severe headache   Oxycodone Itching    hallucinations   Statins    Advair Diskus [Fluticasone-Salmeterol] Other (See Comments)    Mouth sores   Sulfonamide Derivatives Rash    OBJECTIVE: BP (!) 142/75 (BP Location: Left Arm, Patient Position: Sitting, Cuff Size: Normal)    Pulse 73    Temp 98.2 F (36.8 C) (Temporal)    Resp 19    Ht '5\' 3"'  (1.6 m)    SpO2 94%    BMI 33.98 kg/m  Gen: Afebrile. No acute distress. Nontoxic. Appears fatigued.  HENT: AT. [place].  Eyes:Pupils Equal Round Reactive to light, Extraocular movements intact,  Conjunctiva without redness, discharge or icterus. CV: RRR  Chest: CTAB, no wheeze or crackles Abd: Soft. NTND. BS present MSK: no CVA tenderness Skin: no rashes, purpura or petechiae.  Neuro:  Normal gait. PERLA. EOMi. Alert. Oriented x3   Results for orders placed or performed in visit on 08/18/19 (from the past 24 hour(s))  POCT  urinalysis dipstick     Status: Abnormal   Collection Time: 08/18/19 11:32 AM  Result Value Ref Range   Color, UA yellow    Clarity, UA cloudy    Glucose, UA Negative Negative   Bilirubin, UA negative    Ketones, UA negative    Spec Grav, UA 1.020 1.010 - 1.025   Blood, UA negative    pH, UA 6.0 5.0 - 8.0   Protein, UA Positive (A) Negative   Urobilinogen, UA 0.2 0.2 or 1.0 E.U./dL   Nitrite, UA positive    Leukocytes, UA Negative Negative   Appearance     Odor      ASSESSMENT AND PLAN: JMeika Earllis a 86y.o. female present for  Dysuria:  - POCT today for nitrites>  urine culture sent - Started treatment with rocpehin 1g today.  - start omnicef tomorrow.  - cbc, bmp collected today.   Mixed hyperlipidemia - intolerant to statin and zeita. Could consider Repatha (or like) /referral to cardio.  - CBC w/Diff - Comp Met (CMET) - Direct LDL  Memory changes/fatigue Has been recurrent symptoms for he- possibly related to UTI or vitamin/thyroid d/o will collect labs to be certain not missing other causes.  - TSH - B12 - Vitamin D (25 hydroxy)  Osteoporosis, unspecified osteoporosis type, unspecified pathological fracture presence - Vitamin D (25 hydroxy)     Orders Placed This Encounter  Procedures   Urine Culture   TSH   CBC w/Diff   Comp Met (CMET)   B12   Vitamin D (25 hydroxy)   Direct LDL   POCT urinalysis dipstick   Meds ordered this encounter  Medications   cefdinir (OMNICEF) 300 MG capsule    Sig: Take 1 capsule (300 mg total) by mouth 2 (two) times daily.    Dispense:  14 capsule    Refill:  0   Referral Orders  No referral(s) requested today      Howard Pouch, DO 08/18/2019

## 2019-08-18 NOTE — Patient Instructions (Signed)
We will call you with all results from your urine culture and blood labs once available.   Rocpehin shot provided today. Start omnicef tonight before bed or tomorrow morning (depending on time).

## 2019-08-19 ENCOUNTER — Telehealth: Payer: Self-pay | Admitting: Family Medicine

## 2019-08-19 ENCOUNTER — Telehealth: Payer: Self-pay

## 2019-08-19 MED ORDER — VITAMIN D3 20 MCG (800 UNIT) PO TABS: 1.0000 | ORAL_TABLET | Freq: Every day | ORAL | 3 refills | Status: DC

## 2019-08-19 MED ORDER — VITAMIN B-12 5000 MCG SL SUBL: 0.5000 ug | SUBLINGUAL_TABLET | Freq: Every day | SUBLINGUAL | 11 refills | Status: DC

## 2019-08-19 MED ORDER — CYANOCOBALAMIN 3000 MCG/ML SL LIQD: 0.5000 mL | Freq: Every day | SUBLINGUAL | 11 refills | Status: DC

## 2019-08-19 NOTE — Telephone Encounter (Signed)
Called pharmacy and they are going to make the changes for the patient to get it covered by her insurance AT b12 5,000MCG and she will take 2524mcg daily. Vit D they will find another brand for her at 800 units or do two 400 units tablets daily. Approval given to pharmacy.    Daughter was called and VM was left to return call or she would be called back tomorrow

## 2019-08-19 NOTE — Telephone Encounter (Signed)
Pharmacy called in wanting to switch Rx   Cyanocobalamin 3000 MCG/ML LIQD   Pharmacy is only able to get/fill the 5000 MCG/ML  LIQD    Please call and advise

## 2019-08-19 NOTE — Telephone Encounter (Signed)
Prescription can be changed to 5000 mcg dosing they carry.  Patient can take half an mL daily (2500 mcg).   Or patient can  purchase over-the-counter at other drugstores or even [facility], that do carry smaller dosing per vial.

## 2019-08-19 NOTE — Addendum Note (Signed)
Addended by: Caroll Rancher L on: 08/19/2019 03:36 PM   Modules accepted: Orders

## 2019-08-19 NOTE — Telephone Encounter (Signed)
Please inform patient (daughter [name]) Liver, kidney and thyroid functions are normal. Blood cell counts and electrolytes are normal. Vitamin D is lower than desired at 27.  Normal is greater than 30 and for optimal bone health ideally level should be 40-50. Her B12 is 308.  It is desired for this level to be greater than 500, and ideally greater than 700 if able.  Vitamin D and B12 both play a role in fatigue, memory, aches/pains from bone and muscle.  Increasing these levels would likely help her feel better overall.  I have called in the following supplements to [facility] pharmacy.  If insurance does not pay for supplements she can purchase both over-the-counter.  She should purchase the B12 liquid solution that goes under the tongue, otherwise she will not absorb the supplement as well.   -Vitamin D 800 units daily with food    -B12 3000 mcg solution sublingual daily (she will take 0.5 mL which will equal 1500 mcg daily).  Her urine culture is still pending and we will call her with those results.  Hope she is starting to feel better

## 2019-08-19 NOTE — Telephone Encounter (Signed)
Addressed in other phone note.

## 2019-08-19 NOTE — Telephone Encounter (Signed)
Pharmacy called and stated Vit B12 sublingual liquid they have is 5,050mcg. She looked in the "OTC" database and it was only in 5,047mcg also. Pharmacist wanted to let us know that pt will probably have a hard time going anywhere to find a 1,500 or 3,068mcg dosing.   Their Vit D also only comes in 1,000 units at their store. She can go to another store or pharmacy if needing 800 units daily.

## 2019-08-19 NOTE — Telephone Encounter (Signed)
Daughter was in MD office when I called her, she was advised I would call again later.

## 2019-08-20 LAB — URINE CULTURE
MICRO NUMBER:: 10544998
SPECIMEN QUALITY:: ADEQUATE

## 2019-08-20 NOTE — Telephone Encounter (Signed)
Pts daughter called and there was no answer

## 2019-08-20 NOTE — Telephone Encounter (Signed)
Pts daughter was given information/results. She verbalized understanding

## 2019-09-22 ENCOUNTER — Ambulatory Visit: Payer: Medicare Other | Admitting: Rheumatology

## 2019-10-05 DIAGNOSIS — H35033 Hypertensive retinopathy, bilateral: Secondary | ICD-10-CM | POA: Diagnosis not present

## 2019-10-05 DIAGNOSIS — Z961 Presence of intraocular lens: Secondary | ICD-10-CM | POA: Diagnosis not present

## 2019-10-05 DIAGNOSIS — H353132 Nonexudative age-related macular degeneration, bilateral, intermediate dry stage: Secondary | ICD-10-CM | POA: Diagnosis not present

## 2019-10-05 DIAGNOSIS — H04123 Dry eye syndrome of bilateral lacrimal glands: Secondary | ICD-10-CM | POA: Diagnosis not present

## 2019-10-07 DIAGNOSIS — M316 Other giant cell arteritis: Secondary | ICD-10-CM | POA: Diagnosis not present

## 2019-10-25 DIAGNOSIS — Z23 Encounter for immunization: Secondary | ICD-10-CM | POA: Diagnosis not present

## 2019-11-15 DIAGNOSIS — Z23 Encounter for immunization: Secondary | ICD-10-CM | POA: Diagnosis not present

## 2019-11-23 DIAGNOSIS — M810 Age-related osteoporosis without current pathological fracture: Secondary | ICD-10-CM | POA: Diagnosis not present

## 2019-11-23 DIAGNOSIS — M316 Other giant cell arteritis: Secondary | ICD-10-CM | POA: Diagnosis not present

## 2019-11-23 DIAGNOSIS — M2550 Pain in unspecified joint: Secondary | ICD-10-CM | POA: Diagnosis not present

## 2019-11-23 DIAGNOSIS — Z79899 Other long term (current) drug therapy: Secondary | ICD-10-CM | POA: Diagnosis not present

## 2019-12-15 DIAGNOSIS — M316 Other giant cell arteritis: Secondary | ICD-10-CM | POA: Diagnosis not present

## 2019-12-30 DIAGNOSIS — M316 Other giant cell arteritis: Secondary | ICD-10-CM | POA: Diagnosis not present

## 2019-12-30 DIAGNOSIS — Z79899 Other long term (current) drug therapy: Secondary | ICD-10-CM | POA: Diagnosis not present

## 2020-01-12 ENCOUNTER — Other Ambulatory Visit: Payer: Self-pay | Admitting: Family Medicine

## 2020-01-12 DIAGNOSIS — I10 Essential (primary) hypertension: Secondary | ICD-10-CM

## 2020-01-12 DIAGNOSIS — E781 Pure hyperglyceridemia: Secondary | ICD-10-CM

## 2020-01-13 ENCOUNTER — Telehealth: Payer: Self-pay | Admitting: Family Medicine

## 2020-01-13 DIAGNOSIS — I10 Essential (primary) hypertension: Secondary | ICD-10-CM

## 2020-01-13 NOTE — Telephone Encounter (Signed)
Patient's daughter called for refill of Olmesartan, states she will be out in two days. Has med refill appt scheduled for 01/17/20.

## 2020-01-14 MED ORDER — OLMESARTAN MEDOXOMIL 20 MG PO TABS: 30.0000 mg | ORAL_TABLET | Freq: Every day | ORAL | 0 refills | Status: DC

## 2020-01-14 NOTE — Telephone Encounter (Signed)
Spoke with [name] and informed her the med was sent in to [facility]

## 2020-01-17 ENCOUNTER — Other Ambulatory Visit: Payer: Self-pay

## 2020-01-17 ENCOUNTER — Ambulatory Visit (INDEPENDENT_AMBULATORY_CARE_PROVIDER_SITE_OTHER): Payer: Medicare Other | Admitting: Family Medicine

## 2020-01-17 ENCOUNTER — Encounter: Payer: Self-pay | Admitting: Family Medicine

## 2020-01-17 VITALS — BP 132/74 | HR 79 | Temp 98.4°F | Ht 63.0 in | Wt 188.0 lb

## 2020-01-17 DIAGNOSIS — Z23 Encounter for immunization: Secondary | ICD-10-CM | POA: Diagnosis not present

## 2020-01-17 DIAGNOSIS — E782 Mixed hyperlipidemia: Secondary | ICD-10-CM

## 2020-01-17 DIAGNOSIS — J4540 Moderate persistent asthma, uncomplicated: Secondary | ICD-10-CM | POA: Diagnosis not present

## 2020-01-17 DIAGNOSIS — F418 Other specified anxiety disorders: Secondary | ICD-10-CM

## 2020-01-17 DIAGNOSIS — I10 Essential (primary) hypertension: Secondary | ICD-10-CM | POA: Diagnosis not present

## 2020-01-17 MED ORDER — BUDESONIDE-FORMOTEROL FUMARATE 80-4.5 MCG/ACT IN AERO: INHALATION_SPRAY | RESPIRATORY_TRACT | 12 refills | Status: DC

## 2020-01-17 MED ORDER — FLUOXETINE HCL 20 MG PO CAPS: 60.0000 mg | ORAL_CAPSULE | Freq: Every day | ORAL | 1 refills | Status: DC

## 2020-01-17 MED ORDER — ALBUTEROL SULFATE HFA 108 (90 BASE) MCG/ACT IN AERS: INHALATION_SPRAY | RESPIRATORY_TRACT | 5 refills | Status: DC

## 2020-01-17 MED ORDER — OLMESARTAN MEDOXOMIL 20 MG PO TABS: 30.0000 mg | ORAL_TABLET | Freq: Every day | ORAL | 1 refills | Status: DC

## 2020-01-17 MED ORDER — DIAZEPAM 5 MG PO TABS
ORAL_TABLET | ORAL | 5 refills | Status: DC
Start: 2020-01-17 — End: 2020-09-01

## 2020-01-17 NOTE — Patient Instructions (Signed)
Next appt end of April.   I have refilled all her meds.

## 2020-01-17 NOTE — Progress Notes (Addendum)
Patient Care Team    Relationship Specialty Notifications Start End  Ma Hillock, DO PCP - General Family Medicine  03/16/15   Hermelinda Medicus, MD  Internal Medicine  07/11/15    Comment: Rheumotology  Fay Records, MD Consulting Physician Cardiology  07/11/15   [place] Dragon, MD (Inactive)  Gastroenterology  07/11/15   Tanda Rockers, MD Consulting Physician Pulmonary Disease  07/11/15   Jessy Oto, MD Consulting Physician Orthopedic Surgery  07/12/16   Magnus Sinning, MD Consulting Physician Physical Medicine and Rehabilitation  07/12/16   Center, Skin Surgery    07/15/17   [place]  [facility]  07/15/17      SUBJECTIVE Chief Complaint  Patient presents with  . Follow-up    [facility]    HPI: [name] is a 86 y.o. female present for [facility] Hypertension/iron deficiency/hyperlipidemia: Pt Pt reports compliance  with Benicar 30 mg QD. Blood pressures ranges at home are normal. Patient denies chest pain, shortness of breath, dizziness or lower extremity edema.   Pt taking daily baby ASA. Pt is intolerant of statin. Diet: Low-sodium Exercise: Not routinely RF: Hypertension, hyperlipidemia, obesity, Fhx HD  Depression with anxiety: She reports she is feeling well on Prozac 60 mg Qg- we did try to decrease - but anxiety increased.  She continues to take the Valium 2.5/5 mg occasionally.  Indication for chronic benzo: Valium- anxiety and lower back spasms Medication and dose: Valium 2.5 mg/5 mg # pills per month: #45 Last UDS date: next appt.  Pain contract signed (Y/N): Y Date narcotic database last reviewed (include red flags):01/17/20   Asthma: Patient reports compliance  with Symbicort 2 puffs twice a day.  asthma is well controlled.    ROS: See pertinent positives and negatives per HPI.  Patient Active Problem List   Diagnosis Date Noted  . Bimalleolar ankle fracture, right, closed, initial encounter 01/06/2019  . Acute vulvitis 07/31/2018  . Mixed stress and urge  urinary incontinence 09/04/2017  . Elevated hemoglobin ([place]) 04/04/2017  . Chronic back pain 01/28/2017  . Morbid obesity ([place]) 01/28/2017  . Peptic ulcer disease   . Lumbar radiculitis   . Frequent falls 08/02/2016  . Hypertensive retinopathy of both eyes 09/07/2015  . Medicare annual wellness visit, subsequent 07/11/2015  . Long-term use of high-risk medication 07/11/2015  . Osteopenia 04/11/2015  . Depression with anxiety 04/11/2015  . Giant cell arteritis ([place]) 03/28/2015  . Polymyalgia rheumatica ([place]) 03/16/2015  . Chronic narcotic use 03/16/2015  . Anemia, iron deficiency 07/14/2013  . Asthma, moderate persistent 10/15/2012  . Hyperlipidemia 12/27/2008  . HYPERTENSION, BENIGN 12/27/2008    Social History   Tobacco Use  . Smoking status: Never Smoker  . Smokeless tobacco: Never Used  Substance Use Topics  . Alcohol use: No    Current Outpatient Medications:  .  albuterol (PROAIR HFA) 108 (90 Base) MCG/ACT inhaler, 2 puffs up to every 4 hours if can't catch your breath, Disp: 8 g, Rfl: 5 .  alendronate (FOSAMAX) 70 MG tablet, Take by mouth., Disp: , Rfl:  .  aspirin EC 81 MG tablet, Take 1 tablet (81 mg total) by mouth 2 (two) times daily., Disp: 84 tablet, Rfl: 0 .  budesonide-formoterol (SYMBICORT) 80-4.5 MCG/ACT inhaler, USE 2 INHALATIONS TWICE A DAY, Disp: 30.6 g, Rfl: 12 .  calcium-vitamin D (OSCAL WITH D) 500-200 MG-UNIT tablet, Take 1 tablet by mouth 3 (three) times daily., Disp: 90 tablet, Rfl: 6 .  carbamide peroxide (DEBROX) 6.5 %  OTIC solution, Use once a month to keep cerumen form building up., Disp: 15 mL, Rfl: 0 .  Cholecalciferol (VITAMIN D3) 20 MCG (800 UNIT) TABS, Take 1 tablet by mouth daily., Disp: 90 tablet, Rfl: 3 .  Cyanocobalamin (VITAMIN B-12) 5000 MCG SUBL, Place 0.0001 tablets (0.5 mcg total) under the tongue daily. Place 2500MCG under the tongue daily., Disp: 1 tablet, Rfl: 11 .  diazepam (VALIUM) 5 MG tablet, 1/2 tab PO in the morning and 1 tab  QHS PRN., Disp: 45 tablet, Rfl: 5 .  FLUoxetine (PROZAC) 20 MG capsule, Take 3 capsules (60 mg total) by mouth daily., Disp: 270 capsule, Rfl: 1 .  magic mouthwash SOLN, Take 10-15 mLs by mouth 3 (three) times daily as needed for mouth pain., Disp: 240 mL, Rfl: 5 .  olmesartan (BENICAR) 20 MG tablet, Take 1.5 tablets (30 mg total) by mouth daily., Disp: 135 tablet, Rfl: 1 .  omega-3 acid ethyl esters (LOVAZA) 1 g capsule, Take 2 capsules (2 g total) by mouth 2 (two) times daily., Disp: 360 capsule, Rfl: 3 .  omeprazole (PRILOSEC) 40 MG capsule, Take by mouth., Disp: , Rfl:  .  predniSONE (DELTASONE) 5 MG tablet, Take 5 mg by mouth daily. 15mg  daily, Disp: , Rfl:  .  tocilizumab (ACTEMRA) 400 MG/20ML SOLN injection, Infuse 480 mg into the vein once every 28 days., Disp: , Rfl:  .  tocilizumab 4 mg/kg in sodium chloride 0.9 %, Inject 280 mg into the vein., Disp: , Rfl:  .  traMADol (ULTRAM) 50 MG tablet, TAKE ONE TABLET BY MOUTH EVERY 6 HOURS AS NEEDED FOR MODERATE PAIN, Disp: 40 tablet, Rfl: 0 .  triamcinolone (KENALOG) 0.025 % ointment, Apply a very thin layer to affected area every 2 days., Disp: 30 g, Rfl: 0 .  zinc sulfate 220 (50 Zn) MG capsule, Take 1 capsule (220 mg total) by mouth daily., Disp: 42 capsule, Rfl: 0  Allergies  Allergen Reactions  . Atorvastatin Other (See Comments)    Severe headache  . Oxycodone Itching    hallucinations  . Statins   . Advair Diskus [Fluticasone-Salmeterol] Other (See Comments)    Mouth sores  . Sulfonamide Derivatives Rash    OBJECTIVE: BP 132/74   Pulse 79   Temp 98.4 F (36.9 C) (Oral)   Ht 5\' 3"  (1.6 m)   Wt 188 lb (85.3 kg)   SpO2 94%   BMI 33.30 kg/m  Gen: Afebrile. No acute distress. Nontoxic. Pleasant female.  HENT: AT. [place].  Eyes:Pupils Equal Round Reactive to light, Extraocular movements intact,  Conjunctiva without redness, discharge or icterus. Neck/lymp/endocrine: Supple,no lymphadenopathy, no thyromegaly CV: RRR 1-6 SM, no  edema, +2/4 P posterior tibialis pulses Chest: CTAB, no wheeze or crackles Abd: Soft. NTND. BS present.  Neuro:  Normal gait. PERLA. EOMi. Alert. Oriented x3  Psych: Normal affect, dress and demeanor. Normal speech. Normal thought content and judgment..   ASSESSMENT AND PLAN: [name] is a 86 y.o. female present for  Essential hypertension, benign/hyperlipidemia/morbid obesity - stable.  -continue  Benicar 30 mg daily.  Has been intolerant to higher doses of Benicar and multiple other blood pressure regimens.  Is tapering down on her prednisone, so hopefully her blood pressure also returns to normal prednisone is discontinued. - Low-sodium diet - Attempt to stay as active as possible - Continue baby aspirin - Intolerant to statins; could consider Lovaza  versus Zetia or WelChol.   - Follow-up 5.5 months  Moderate persistent asthma without complication  Stable.  Continue Symbicort 2 puffs twice a day.   Continue albuterol 1 to 2 puffs every 6 hours as needed.   Depression with anxiety Stable.  Continue  prozac 60 mg QD  Continue  valium.  Indication for chronic benzo: Valium- anxiety and lower back spasms Medication and dose: Valium 2.5 mg/5 mg # pills per month: #45 Last UDS date: next appt.  Pain contract signed (Y/N): Y Date narcotic database last reviewed (include red flags):07/28/18  -Follow-up 6 months  Influenza vaccine administered today.    Meds ordered this encounter  Medications  . olmesartan (BENICAR) 20 MG tablet    Sig: Take 1.5 tablets (30 mg total) by mouth daily.    Dispense:  135 tablet    Refill:  1  . FLUoxetine (PROZAC) 20 MG capsule    Sig: Take 3 capsules (60 mg total) by mouth daily.    Dispense:  270 capsule    Refill:  1  . budesonide-formoterol (SYMBICORT) 80-4.5 MCG/ACT inhaler    Sig: USE 2 INHALATIONS TWICE A DAY    Dispense:  30.6 g    Refill:  12  . albuterol (PROAIR HFA) 108 (90 Base) MCG/ACT inhaler    Sig: 2 puffs up to every  4 hours if can't catch your breath    Dispense:  8 g    Refill:  5  . diazepam (VALIUM) 5 MG tablet    Sig: 1/2 tab PO in the morning and 1 tab QHS PRN.    Dispense:  45 tablet    Refill:  5   Orders Placed This Encounter  Procedures  . Flu Vaccine QUAD High Dose(Fluad)    Howard Pouch, DO 01/17/2020

## 2020-01-25 ENCOUNTER — Other Ambulatory Visit: Payer: Self-pay

## 2020-01-25 ENCOUNTER — Other Ambulatory Visit: Payer: Self-pay | Admitting: Family Medicine

## 2020-01-25 DIAGNOSIS — M316 Other giant cell arteritis: Secondary | ICD-10-CM | POA: Diagnosis not present

## 2020-01-25 DIAGNOSIS — I10 Essential (primary) hypertension: Secondary | ICD-10-CM

## 2020-01-25 MED ORDER — OLMESARTAN MEDOXOMIL 20 MG PO TABS: 30.0000 mg | ORAL_TABLET | Freq: Every day | ORAL | 0 refills | Status: DC

## 2020-01-25 NOTE — Telephone Encounter (Signed)
Bridge sent

## 2020-01-25 NOTE — Telephone Encounter (Signed)
Patient daughter [facility]) called regarding prescription for   olmesartan (BENICAR) 20 MG tablet [[id number]]   They have not received mail order supply.  Patient is out of meds.  She is requesting for another 2 weeks worth of meds called into her local pharmacy which is   [place]   Thank you.

## 2020-01-31 ENCOUNTER — Telehealth: Payer: Self-pay

## 2020-01-31 NOTE — Telephone Encounter (Signed)
Patient Name: [name] Gender: Female DOB: 1929-06-18 Age: 86 Y 61 M 23 D Return Phone Number: [phone] (Primary) Address: City/State/Zip: [place] [address] Client [facility] - Client Client Site [place] - Day Physician Raoul Pitch, [place] Contact Type Call Who Is Calling Patient / Member / Family / Caregiver Call Type Triage / Clinical Caller Name Anitta Relationship To Patient Daughter Return Phone Number [phone] (Primary) Chief Complaint Dizziness Reason for Call Symptomatic / Request for [place] states, mom having vertigo and dizziness. Pt having ear sweating- floods her ears. Translation No Nurse Assessment Nurse: Radford Pax, RN, Eugene Garnet Date/Time Eilene Ghazi Time): 01/31/2020 9:41:06 AM Confirm and document reason for call. If symptomatic, describe symptoms. ---Caller states that mom is having dizziness/vertigo. Blurry vision. Itchy ears. Has been having ear sweats from hearing aids. No fever. Does the patient have any new or worsening symptoms? ---Yes Will a triage be completed? ---Yes Related visit to physician within the last 2 weeks? ---No Does the PT have any chronic conditions? (i.e. diabetes, asthma, this includes High risk factors for pregnancy, etc.) ---Yes List chronic conditions. ---HTN Is this a behavioral health or substance abuse call? ---No Guidelines Guideline Title Affirmed Question Affirmed Notes Nurse Date/Time (Eastern Time) Dizziness - Vertigo [1] Dizziness (vertigo) present now AND [2] age > 78 (Exception: prior physician evaluation for this AND no different/ worse than usual) Turner, RN, Rebekah 01/31/2020 9:43:26 AM Disp. Time Eilene Ghazi Time) Disposition Final User 01/31/2020 9:50:32 AM Go to ED Now (or PCP triage) Yes Turner, RN, Eugene Garnet PLEASE NOTE: All timestamps contained within this report are represented as [place] Standard Time. CONFIDENTIALTY NOTICE:  This fax transmission is intended only for the addressee. It contains information that is legally privileged, confidential or otherwise protected from use or disclosure. If you are not the intended recipient, you are strictly prohibited from reviewing, disclosing, copying using or disseminating any of this information or taking any action in reliance on or regarding this information. If you have received this fax in error, please notify us immediately by telephone so that we can arrange for its return to [place]. Phone: [phone], Toll-Free: [phone], Fax: [phone] Page: 2 of 2 Call Id: [id number] [place] Disagree/Comply Comply Caller Understands Yes PreDisposition Call Doctor Care Advice Given Per Guideline GO TO ED NOW (OR PCP TRIAGE): * IF NO PCP (PRIMARY CARE PROVIDER) SECOND-LEVEL TRIAGE: You need to be seen within the next hour. Go to the [facility] at _____________ [place] as soon as you can. CARE ADVICE given per Dizziness - Vertigo (Adult) guideline. Comments User: Susie Cassette, RN Date/Time Eilene Ghazi Time): 01/31/2020 9:51:31 AM Spoke with office and they state they have no availabilities today and UC would be preferable. Relayed this dtr and she verbalizes understanding. Referrals GO TO FACILITY UNDECIDED

## 2020-01-31 NOTE — Telephone Encounter (Signed)
Pt daughter, [name], tried to convince pt to go to [facility] when she called UC whoe recommend to go to ER. Anita cleaned pt ear with warm water and pt states she is doing better but pt daughter will like a appointment with PCP to confirm. Pt daughter is aware that we could still possibly to ER if we feel it is necessary

## 2020-01-31 NOTE — Telephone Encounter (Signed)
Noted- please schedule them if they have not done so already

## 2020-01-31 NOTE — Telephone Encounter (Signed)
FYI

## 2020-02-01 ENCOUNTER — Encounter: Payer: Self-pay | Admitting: Family Medicine

## 2020-02-01 ENCOUNTER — Ambulatory Visit (INDEPENDENT_AMBULATORY_CARE_PROVIDER_SITE_OTHER): Payer: Medicare Other | Admitting: Family Medicine

## 2020-02-01 ENCOUNTER — Other Ambulatory Visit: Payer: Self-pay

## 2020-02-01 VITALS — BP 169/76 | HR 77 | Temp 98.9°F | Ht 63.0 in | Wt 185.0 lb

## 2020-02-01 DIAGNOSIS — I10 Essential (primary) hypertension: Secondary | ICD-10-CM | POA: Diagnosis not present

## 2020-02-01 DIAGNOSIS — J0100 Acute maxillary sinusitis, unspecified: Secondary | ICD-10-CM | POA: Diagnosis not present

## 2020-02-01 DIAGNOSIS — R42 Dizziness and giddiness: Secondary | ICD-10-CM | POA: Diagnosis not present

## 2020-02-01 MED ORDER — OLMESARTAN MEDOXOMIL 40 MG PO TABS: 40.0000 mg | ORAL_TABLET | Freq: Every day | ORAL | 1 refills | Status: DC

## 2020-02-01 MED ORDER — CEFDINIR 300 MG PO CAPS: 300.0000 mg | ORAL_CAPSULE | Freq: Two times a day (BID) | ORAL | 0 refills | Status: DC

## 2020-02-01 MED ORDER — MECLIZINE HCL 25 MG PO TABS: 12.5000 mg | ORAL_TABLET | Freq: Two times a day (BID) | ORAL | 1 refills | Status: DC | PRN

## 2020-02-01 NOTE — Progress Notes (Signed)
This visit occurred during the SARS-CoV-2 public health emergency.  Safety protocols were in place, including screening questions prior to the visit, additional usage of staff PPE, and extensive cleaning of exam room while observing appropriate contact time as indicated for disinfecting solutions.    [name] , Apr 04, 1929, 86 y.o., female MRN: [id number] Patient Care Team    Relationship Specialty Notifications Start End  Ma Hillock, DO PCP - General Family Medicine  03/16/15   Hermelinda Medicus, MD  Internal Medicine  07/11/15    Comment: Rheumotology  Fay Records, MD Consulting Physician Cardiology  07/11/15   Lafayette Dragon, MD (Inactive)  Gastroenterology  07/11/15   Tanda Rockers, MD Consulting Physician Pulmonary Disease  07/11/15   Jessy Oto, MD Consulting Physician Orthopedic Surgery  07/12/16   Magnus Sinning, MD Consulting Physician Physical Medicine and Rehabilitation  07/12/16   [facility]    07/15/17   [place]  [facility]  07/15/17     Chief Complaint  Patient presents with  . Dizziness    pt states her head is aching; x 1 day; was feeling better but worsen this morning; pt states it worse with movement     Subjective: [name] is a 86 y.o. Pt presents for an OV with her daughter with  complaints of vertigo and headache  of 1 day duration.  Associated symptoms include worse this morning and w/ moving her head.  She reports it feels like her eyes are moving when she turns her head.  She reports headache in the back of her head yesterday.  Is mildly improved today.  She states she had her ears cleaned out yesterday and that helped with her symptoms for a couple hours.  She denies any falls.  They report her blood pressure has been elevated in the 150s lately.  She denies fever, chills, nausea, GI symptoms, dysuria or changes in bowel habits.  She does feel she has sinus pressure, mild sore throat and postnasal drip.  Depression screen [facility] 2/9  01/17/2020 07/28/2018 01/13/2018 07/15/2017 07/15/2017  Decreased Interest 0 0 - 0 0  Down, Depressed, Hopeless 0 1 1 0 0  PHQ - 2 Score 0 1 1 0 0  Altered sleeping - 0 1 0 0  Tired, decreased energy - 3 1 0 1  Change in appetite - 0 0 0 0  Feeling bad or failure about yourself  - 1 0 0 0  Trouble concentrating - 1 1 0 0  Moving slowly or fidgety/restless - 0 0 0 0  Suicidal thoughts - 0 0 0 0  PHQ-9 Score - 6 4 0 1  Difficult doing work/chores - Not difficult at all Somewhat difficult - -    Allergies  Allergen Reactions  . Atorvastatin Other (See Comments)    Severe headache  . Oxycodone Itching    hallucinations  . Statins   . Advair Diskus [Fluticasone-Salmeterol] Other (See Comments)    Mouth sores  . 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    Lives with [name] and [name].   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   Patient takes a daily vitamin   She wears her seat belt. Exercises greater than 3 times a week.   Wears 2 hearing aids. No dentures.   "Sometimes "requires assistive device for walking, either a walker or wheelchair.   There is a smoke detector  in her home. There are firearms in her home. She feels safe in her relationships.   Past Medical History:  Diagnosis Date  . Abdominal bloating   . Abnormal LFTs (liver function tests)   . Ankle fracture, bimalleolar, closed, right, initial encounter   . Anxiety disorder   . Arthritis   . Asthma   . Depression   . Diverticulosis   . Edema   . Fatty liver   . Fibromyalgia   . GERD (gastroesophageal reflux disease)   . Giant cell arteritis (HCC)    No biopsy secondary to steroid use  . Hx of adenomatous colonic polyps   . Hyperlipidemia   . Hypertension   . IBS (irritable bowel syndrome)   . Lumbar radiculitis   . Osteoporosis 09/2017   T score -2.6 left femoral neck  . Peptic ulcer disease   . Skin cancer    shoulder, leg, right eye (?  squamous by resection)  . Thoracic radiculitis   . Thyroid nodule    "removed"  . Urinary incontinence   . Vertigo    Past Surgical History:  Procedure Laterality Date  . ABDOMINAL HYSTERECTOMY    . BACK SURGERY  2005   lower back  . EYE SURGERY Right 2006  . LIVER BIOPSY     fatty liver  . NASAL SEPTUM SURGERY    . ORIF ANKLE FRACTURE Right 01/11/2019   Procedure: OPEN REDUCTION INTERNAL FIXATION (ORIF) RIGHT BIMALLEOLAR ANKLE FRACTURE;  Surgeon: Leandrew Koyanagi, MD;  Location: [place];  Service: Orthopedics;  Laterality: Right;  . right knee surgery  2006   arthroscopy  . right leg skin cancer surgery  2014  . right shoulder skin cancer excision  2012  . SKIN CANCER EXCISION    . THYROIDECTOMY     Family History  Problem Relation Age of Onset  . Colon polyps Maternal Aunt   . Diabetes Father   . Heart disease Father   . Heart disease Mother   . Lung disease Mother   . COPD Mother   . Lung cancer Brother   . Cancer Brother        lung  . COPD Brother   . Stroke Brother   . Lung cancer Sister   . COPD Sister   . Kidney disease Other        niece-mat side  . Arthritis Other   . Diabetes Son   . Arthritis Daughter   . Colon cancer Neg Hx    Allergies as of 02/01/2020      Reactions   Atorvastatin Other (See Comments)   Severe headache   Oxycodone Itching   hallucinations   Statins    Advair Diskus [fluticasone-salmeterol] Other (See Comments)   Mouth sores   Sulfonamide Derivatives Rash      Medication List       Accurate as of February 01, 2020 12:03 PM. If you have any questions, ask your nurse or doctor.        albuterol 108 (90 Base) MCG/ACT inhaler Commonly known as: ProAir HFA 2 puffs up to every 4 hours if can't catch your breath   alendronate 70 MG tablet Commonly known as: FOSAMAX Take by mouth.   aspirin EC 81 MG tablet Take 1 tablet (81 mg total) by mouth 2 (two) times daily.   budesonide-formoterol 80-4.5 MCG/ACT  inhaler Commonly known as: Symbicort USE 2 INHALATIONS TWICE A DAY   calcium-vitamin D 500-200 MG-UNIT tablet Commonly known  as: OSCAL WITH D Take 1 tablet by mouth 3 (three) times daily.   cefdinir 300 MG capsule Commonly known as: OMNICEF Take 1 capsule (300 mg total) by mouth 2 (two) times daily. Started by: Howard Pouch, DO   Debrox 6.5 % OTIC solution Generic drug: carbamide peroxide Use once a month to keep cerumen form building up.   diazepam 5 MG tablet Commonly known as: VALIUM 1/2 tab PO in the morning and 1 tab QHS PRN.   FLUoxetine 20 MG capsule Commonly known as: PROZAC Take 3 capsules (60 mg total) by mouth daily.   magic mouthwash Soln Take 10-15 mLs by mouth 3 (three) times daily as needed for mouth pain.   meclizine 25 MG tablet Commonly known as: ANTIVERT Take 0.5 tablets (12.5 mg total) by mouth 2 (two) times daily as needed for dizziness. Started by: Howard Pouch, DO   olmesartan 40 MG tablet Commonly known as: BENICAR Take 1 tablet (40 mg total) by mouth daily. What changed:   medication strength  how much to take Changed by: Howard Pouch, DO   omega-3 acid ethyl esters 1 g capsule Commonly known as: LOVAZA Take 2 capsules (2 g total) by mouth 2 (two) times daily.   omeprazole 40 MG capsule Commonly known as: PRILOSEC Take by mouth.   predniSONE 5 MG tablet Commonly known as: DELTASONE Take 5 mg by mouth daily.   tocilizumab 4 mg/kg in sodium chloride 0.9 % Inject 280 mg into the vein.   tocilizumab 400 MG/20ML Soln injection Commonly known as: ACTEMRA Infuse 480 mg into the vein once every 28 days.   traMADol 50 MG tablet Commonly known as: ULTRAM TAKE ONE TABLET BY MOUTH EVERY 6 HOURS AS NEEDED FOR MODERATE PAIN   triamcinolone 0.025 % ointment Commonly known as: KENALOG Apply a very thin layer to affected area every 2 days.   Vitamin B-12 5000 MCG Subl Place 0.0001 tablets (0.5 mcg total) under the tongue daily. Place  2500MCG under the tongue daily.   Vitamin D3 20 MCG (800 UNIT) Tabs Take 1 tablet by mouth daily.   zinc sulfate 220 (50 Zn) MG capsule Take 1 capsule (220 mg total) by mouth daily.       All past medical history, surgical history, allergies, family history, immunizations andmedications were updated in the EMR today and reviewed under the history and medication portions of their EMR.     ROS: Negative, with the exception of above mentioned in HPI   Objective:  BP (!) 169/76   Pulse 77   Temp 98.9 F (37.2 C) (Oral)   Ht 5\' 3"  (1.6 m)   Wt 185 lb (83.9 kg)   SpO2 94%   BMI 32.77 kg/m  Body mass index is 32.77 kg/m. Gen: Afebrile. No acute distress. Nontoxic in appearance, well developed, well nourished. Very pleasant female.  HENT: AT. [place]. Bilateral TM visualized without erythema.  External auditory canal consistent with recently Debrox ear cleansing.  2 areas of abrasion from her hearing aids left distal EAC.  No drainage.  No redness. MMM, no oral lesions. Bilateral nares mild redness right nare, no swelling, no drainage. Throat without erythema or exudates.  No cough.  No shortness of breath. Eyes:Pupils Equal Round Reactive to light, Extraocular movements intact,  Conjunctiva without redness, discharge or icterus. Neck/lymp/endocrine: Supple, no lymphadenopathy Neuro:  Normal gait. PERLA. EOMi. Alert. Oriented x3  Psych: Normal affect, dress and demeanor. Normal speech. Normal thought content and judgment.  No exam  data present No results found. No results found for this or any previous visit (from the past 24 hour(s)).  Assessment/Plan: Katilin Raynes is a 86 y.o. female present for OV for  Essential hypertension, benign Patient's blood pressure has been borderline over the last few visits and rather elevated at home over the last week and today here despite patient having her blood pressure medication in her system. Increased Benicar to 40 mg daily Uncertain if  headache is from elevated blood pressure or other causes. - olmesartan (BENICAR) 40 MG tablet; Take 1 tablet (40 mg total) by mouth daily.  Dispense: 90 tablet; Refill: 1  Acute maxillary sinusitis, recurrence not specified/headache/vertigo Uncertain cause of symptoms.  She is complaining of sinusitis symptoms which could be contributing to her headache and vertigo.  Her ear exam is normal today.  She has signs of mild sinusitis on exam. Discussed possibility of migraine versus viral syndrome for cause of symptoms. Encouraged her to rest and hydrate Increased Benicar for better control of blood pressure Antivert 12.5 mg twice daily as needed for vertigo If symptoms still present or patient experiences fever or chills-they were provided with a printed prescription of Omnicef to start at that time for bacterial sinusitis coverage. Patient and daughter aware of symptoms are not resolved or worsening after starting antibiotic to be seen immediately for further evaluation and possible CT of head.   Reviewed expectations re: course of current medical issues.  Discussed self-management of symptoms.  Outlined signs and symptoms indicating need for more acute intervention.  Patient verbalized understanding and all questions were answered.  Patient received an After-Visit Summary.    No orders of the defined types were placed in this encounter.  Meds ordered this encounter  Medications  . olmesartan (BENICAR) 40 MG tablet    Sig: Take 1 tablet (40 mg total) by mouth daily.    Dispense:  90 tablet    Refill:  1    Dc prior dose  . cefdinir (OMNICEF) 300 MG capsule    Sig: Take 1 capsule (300 mg total) by mouth 2 (two) times daily.    Dispense:  20 capsule    Refill:  0  . meclizine (ANTIVERT) 25 MG tablet    Sig: Take 0.5 tablets (12.5 mg total) by mouth 2 (two) times daily as needed for dizziness.    Dispense:  30 tablet    Refill:  1   Referral Orders  No referral(s) requested  today     Note is dictated utilizing voice recognition software. Although note has been proof read prior to signing, occasional typographical errors still can be missed. If any questions arise, please do not hesitate to call for verification.   electronically signed by:  Howard Pouch, DO  [place]

## 2020-02-01 NOTE — Patient Instructions (Addendum)
Pleasure to see you today.  I hope you have a Happy Thanksgiving!  Increased benicar to 40 mg a day. She can use two of the benicar 20 mg tabs she currently has- new bottle will be 40 mg > take only one then.    Start Mucinex. Antivert 1/2 tab for vertigo.   Omnicef antibiotic printed> if not improving my Thursday or worsening> start antibiotic

## 2020-02-17 ENCOUNTER — Telehealth: Payer: Self-pay | Admitting: Family Medicine

## 2020-02-17 NOTE — Telephone Encounter (Signed)
Please have daughter schedule her mother an appointment early next week to further discuss and reevaluate condition.

## 2020-02-17 NOTE — Telephone Encounter (Signed)
Pt sched Monday at 330

## 2020-02-17 NOTE — Telephone Encounter (Signed)
Please advise on CT

## 2020-02-17 NOTE — Telephone Encounter (Signed)
Patient's daughter, [name], states the patient's eye doctor cannot see her until January 3. Patient is still having symptoms. Should they go go ahead with CT or MRI before that eye appt in January. Please call patient's daughter to advise.

## 2020-02-21 ENCOUNTER — Other Ambulatory Visit: Payer: Self-pay

## 2020-02-21 ENCOUNTER — Ambulatory Visit (INDEPENDENT_AMBULATORY_CARE_PROVIDER_SITE_OTHER): Payer: Medicare Other | Admitting: Family Medicine

## 2020-02-21 ENCOUNTER — Encounter: Payer: Self-pay | Admitting: Family Medicine

## 2020-02-21 VITALS — BP 175/69 | HR 76 | Temp 97.5°F | Ht 63.0 in | Wt 187.0 lb

## 2020-02-21 DIAGNOSIS — H81399 Other peripheral vertigo, unspecified ear: Secondary | ICD-10-CM

## 2020-02-21 DIAGNOSIS — G4452 New daily persistent headache (NDPH): Secondary | ICD-10-CM | POA: Diagnosis not present

## 2020-02-21 DIAGNOSIS — H539 Unspecified visual disturbance: Secondary | ICD-10-CM | POA: Diagnosis not present

## 2020-02-21 DIAGNOSIS — E538 Deficiency of other specified B group vitamins: Secondary | ICD-10-CM

## 2020-02-21 DIAGNOSIS — M542 Cervicalgia: Secondary | ICD-10-CM | POA: Diagnosis not present

## 2020-02-21 MED ORDER — PREDNISONE 20 MG PO TABS: ORAL_TABLET | ORAL | 0 refills | Status: DC

## 2020-02-21 NOTE — Progress Notes (Signed)
This visit occurred during the SARS-CoV-2 public health emergency.  Safety protocols were in place, including screening questions prior to the visit, additional usage of staff PPE, and extensive cleaning of exam room while observing appropriate contact time as indicated for disinfecting solutions.    [name] , [birth year], 86 y.o., female MRN: [id number] Patient Care Team    Relationship Specialty Notifications Start End  Ma Hillock, DO PCP - General Family Medicine  03/16/15   Hermelinda Medicus, MD  Internal Medicine  07/11/15    Comment: Rheumotology  Fay Records, MD Consulting Physician Cardiology  07/11/15   Lafayette Dragon, MD (Inactive)  Gastroenterology  07/11/15   Tanda Rockers, MD Consulting Physician Pulmonary Disease  07/11/15   Jessy Oto, MD Consulting Physician Orthopedic Surgery  07/12/16   Magnus Sinning, MD Consulting Physician Physical Medicine and Rehabilitation  07/12/16   [facility]    07/15/17   [facility]  Dentistry  07/15/17     Chief Complaint  Patient presents with  . Neck Pain    pt states pain has worsen;      Subjective: Pt presents for an OV with her daughter again today for follow-up on her headaches.  Patient reports she has had a daily persistent headache in the back of her head.  She also complains of dizziness and visual changes with any movement of her head or even looking down or putting her head back.  She also complains of pain along the right side of her neck.  She has had sharp shooting pains in bilateral ears over this time.  She does wear hearing aids typically.  They deny fever, chills, nausea or vomit.  She was treated with Omnicef last visit in hopes of symptoms possibly be related to sinuses, since she had mild sinusitis on exam.  She did not have any resolution of symptoms with antibiotic treatment.  She has a history of polymyalgia rheumatica, hypertension, hyperlipidemia, giant cell arteritis, elevated hemoglobin of  arthritis that may be playing a role in her symptoms.  She had elevated pressures last visit and her Benicar was increased.  Family states that her blood pressure has been much better controlled at higher dose of Benicar.  They report her blood pressures are elevated today because she would not take the pain medication prior to visit-because she did not want to risk you pain level by masking with medication.  She reports the Antivert 12.5 mg is helpful with the vertigo.  She currently is not taking the Valium routinely.  She did take the tramadol for pain.  Prior note vertigo and headache  of 1 day duration.  Associated symptoms include worse this morning and w/ moving her head.  She reports it feels like her eyes are moving when she turns her head.  She reports headache in the back of her head yesterday.  Is mildly improved today.  She states she had her ears cleaned out yesterday and that helped with her symptoms for a couple hours.  She denies any falls.  They report her blood pressure has been elevated in the 150s lately.   Depression screen [facility] 2/9 01/17/2020 07/28/2018 01/13/2018 07/15/2017 07/15/2017  Decreased Interest 0 0 - 0 0  Down, Depressed, Hopeless 0 1 1 0 0  PHQ - 2 Score 0 1 1 0 0  Altered sleeping - 0 1 0 0  Tired, decreased energy - 3 1 0 1  Change in appetite -  0 0 0 0  Feeling bad or failure about yourself  - 1 0 0 0  Trouble concentrating - 1 1 0 0  Moving slowly or fidgety/restless - 0 0 0 0  Suicidal thoughts - 0 0 0 0  PHQ-9 Score - 6 4 0 1  Difficult doing work/chores - Not difficult at all Somewhat difficult - -    Allergies  Allergen Reactions  . Atorvastatin Other (See Comments)    Severe headache  . Oxycodone Itching    hallucinations  . Statins   . Advair Diskus [Fluticasone-Salmeterol] Other (See Comments)    Mouth sores  . 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    Lives with [name] and [name].   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   Patient takes a daily vitamin   She wears her seat belt. Exercises greater than 3 times a week.   Wears 2 hearing aids. No dentures.   "Sometimes "requires assistive device for walking, either a walker or wheelchair.   There is a smoke detector in her home. There are firearms in her home. She feels safe in her relationships.   Past Medical History:  Diagnosis Date  . Abdominal bloating   . Abnormal LFTs (liver function tests)   . Ankle fracture, bimalleolar, closed, right, initial encounter   . Anxiety disorder   . Arthritis   . Asthma   . Depression   . Diverticulosis   . Edema   . Fatty liver   . Fibromyalgia   . GERD (gastroesophageal reflux disease)   . Giant cell arteritis (HCC)    No biopsy secondary to steroid use  . Hx of adenomatous colonic polyps   . Hyperlipidemia   . Hypertension   . IBS (irritable bowel syndrome)   . Lumbar radiculitis   . Osteoporosis 09/2017   T score -2.6 left femoral neck  . Peptic ulcer disease   . Skin cancer    shoulder, leg, right eye (? squamous by resection)  . Thoracic radiculitis   . Thyroid nodule    "removed"  . Urinary incontinence   . Vertigo    Past Surgical History:  Procedure Laterality Date  . ABDOMINAL HYSTERECTOMY    . BACK SURGERY  2005   lower back  . EYE SURGERY Right 2006  . LIVER BIOPSY     fatty liver  . NASAL SEPTUM SURGERY    . ORIF ANKLE FRACTURE Right 01/11/2019   Procedure: OPEN REDUCTION INTERNAL FIXATION (ORIF) RIGHT BIMALLEOLAR ANKLE FRACTURE;  Surgeon: Leandrew Koyanagi, MD;  Location: [facility];  Service: Orthopedics;  Laterality: Right;  . right knee surgery  2006   arthroscopy  . right leg skin cancer surgery  2014  . right shoulder skin cancer excision  2012  . SKIN CANCER EXCISION    . THYROIDECTOMY     Family History  Problem Relation Age of Onset  . Colon polyps Maternal Aunt   . Diabetes Father   . Heart disease Father    . Heart disease Mother   . Lung disease Mother   . COPD Mother   . Lung cancer Brother   . Cancer Brother        lung  . COPD Brother   . Stroke Brother   . Lung cancer Sister   . COPD Sister   . Kidney disease Other  niece-mat side  . Arthritis Other   . Diabetes Son   . Arthritis Daughter   . Colon cancer Neg Hx    Allergies as of 02/21/2020      Reactions   Atorvastatin Other (See Comments)   Severe headache   Oxycodone Itching   hallucinations   Statins    Advair Diskus [fluticasone-salmeterol] Other (See Comments)   Mouth sores   Sulfonamide Derivatives Rash      Medication List       Accurate as of February 21, 2020  3:57 PM. If you have any questions, ask your nurse or doctor.        albuterol 108 (90 Base) MCG/ACT inhaler Commonly known as: ProAir HFA 2 puffs up to every 4 hours if can't catch your breath   alendronate 70 MG tablet Commonly known as: FOSAMAX Take by mouth.   aspirin EC 81 MG tablet Take 1 tablet (81 mg total) by mouth 2 (two) times daily.   budesonide-formoterol 80-4.5 MCG/ACT inhaler Commonly known as: Symbicort USE 2 INHALATIONS TWICE A DAY   calcium-vitamin D 500-200 MG-UNIT tablet Commonly known as: OSCAL WITH D Take 1 tablet by mouth 3 (three) times daily.   cefdinir 300 MG capsule Commonly known as: OMNICEF Take 1 capsule (300 mg total) by mouth 2 (two) times daily.   Debrox 6.5 % OTIC solution Generic drug: carbamide peroxide Use once a month to keep cerumen form building up.   diazepam 5 MG tablet Commonly known as: VALIUM 1/2 tab PO in the morning and 1 tab QHS PRN.   FLUoxetine 20 MG capsule Commonly known as: PROZAC Take 3 capsules (60 mg total) by mouth daily.   magic mouthwash Soln Take 10-15 mLs by mouth 3 (three) times daily as needed for mouth pain.   meclizine 25 MG tablet Commonly known as: ANTIVERT Take 0.5 tablets (12.5 mg total) by mouth 2 (two) times daily as needed for dizziness.    olmesartan 40 MG tablet Commonly known as: BENICAR Take 1 tablet (40 mg total) by mouth daily.   omega-3 acid ethyl esters 1 g capsule Commonly known as: LOVAZA Take 2 capsules (2 g total) by mouth 2 (two) times daily.   omeprazole 40 MG capsule Commonly known as: PRILOSEC Take by mouth.   predniSONE 5 MG tablet Commonly known as: DELTASONE Take 5 mg by mouth daily. What changed: Another medication with the same name was added. Make sure you understand how and when to take each. Changed by: Howard Pouch, DO   predniSONE 20 MG tablet Commonly known as: DELTASONE 60 mg x3d, 40 mg x3d, 20 mg x2d, 10 mg x2d What changed: You were already taking a medication with the same name, and this prescription was added. Make sure you understand how and when to take each. Changed by: Howard Pouch, DO   tocilizumab 4 mg/kg in sodium chloride 0.9 % Inject 280 mg into the vein.   tocilizumab 400 MG/20ML Soln injection Commonly known as: ACTEMRA Infuse 480 mg into the vein once every 28 days.   traMADol 50 MG tablet Commonly known as: ULTRAM TAKE ONE TABLET BY MOUTH EVERY 6 HOURS AS NEEDED FOR MODERATE PAIN   triamcinolone 0.025 % ointment Commonly known as: KENALOG Apply a very thin layer to affected area every 2 days.   Vitamin B-12 5000 MCG Subl Place 0.0001 tablets (0.5 mcg total) under the tongue daily. Place 2500MCG under the tongue daily.   Vitamin D3 20 MCG (800 UNIT) Tabs  Take 1 tablet by mouth daily.   zinc sulfate 220 (50 Zn) MG capsule Take 1 capsule (220 mg total) by mouth daily.       All past medical history, surgical history, allergies, family history, immunizations andmedications were updated in the EMR today and reviewed under the history and medication portions of their EMR.     ROS: Negative, with the exception of above mentioned in HPI   Objective:  BP (!) 175/69   Pulse 76   Temp (!) 97.5 F (36.4 C) (Oral)   Ht '5\' 3"'  (1.6 m)   Wt 187 lb (84.8 kg)    SpO2 95%   BMI 33.13 kg/m  Body mass index is 33.13 kg/m. Gen: Afebrile. No acute distress. Nontoxic in appearance, well developed, well nourished.  Appears fatigued and in pain today.  Hard of hearing HENT: AT. [place]. Bilateral TM visualized without erythema or bulging, intact. MMM, no oral lesions.  Eyes:Pupils Equal Round Reactive to light, Extraocular movements intact,  Conjunctiva without redness, discharge or icterus. Neck/lymp/endocrine: Supple, no lymphadenopathy CV: RRR, no edema Chest: CTAB, no wheeze or crackles.   MSK: Patient reports discomfort in her neck and head with extension and flexion of cervical spine.  Turning her head causes her mild discomfort and dizziness.  She is tender to palpation just right of the spinous process at level C6 and C7.  No muscle spasms or ropiness appreciated. Neuro: Normal gait. PERLA. EOMi. Alert. Oriented x3  Psych: Normal affect, dress and demeanor. Normal speech. Normal thought content and judgment.  No exam data present No results found. No results found for this or any previous visit (from the past 24 hour(s)).  Assessment/Plan: Aldina Porta is a 86 y.o. female present for OV for  New daily persistent headache/visual changes/neck pain/vertigo Uncertain etiology of her constellation of symptoms.  Symptoms have been present now greater than 4 weeks with persistent daily headache, dizziness and visual changes.  Need to move forward with MRI to rule out NPH, ICH, stroke or mass causing daily persistent headache. - B12 - Magnesium - Sedimentation rate - Comp Met (CMET) - CBC w/Diff - MR Brain W Wo Contrast; Future Consider neurology referral after results received With ear pain also involved could consider ENT referral after resulted.  Neck pain Possibly arthritic/MSK related given tender to palpation right has been lying C6-C7 spinous process.  However her overall symptoms would suggest a more neurological concern with her daily  persistent headaches and visual changes of with vertigo.  Possibly 2 separate issues occurring. Patient instructed to take her tramadol as needed for pain and Valium half tab twice daily. Start prednisone taper, at the end of taper resume normal dose of prednisone. - B12 - Magnesium - Sedimentation rate - Comp Met (CMET) - CBC w/Diff - MR Brain W Wo Contrast; Future Patient will be followed closely once laboratory and imaging studies resulted   B12 deficiency - B12  Patient will be followed closely once imaging and labs are resulted   Reviewed expectations re: course of current medical issues.  Discussed self-management of symptoms.  Outlined signs and symptoms indicating need for more acute intervention.  Patient verbalized understanding and all questions were answered.  Patient received an After-Visit Summary.    Orders Placed This Encounter  Procedures  . B12  . Magnesium  . Sedimentation rate  . Comp Met (CMET)  . CBC w/Diff   Meds ordered this encounter  Medications  . predniSONE (DELTASONE) 20 MG tablet  Sig: 60 mg x3d, 40 mg x3d, 20 mg x2d, 10 mg x2d    Dispense:  18 tablet    Refill:  0   Referral Orders  No referral(s) requested today     Note is dictated utilizing voice recognition software. Although note has been proof read prior to signing, occasional typographical errors still can be missed. If any questions arise, please do not hesitate to call for verification.   electronically signed by:  Howard Pouch, DO  [place]

## 2020-02-21 NOTE — Patient Instructions (Addendum)
We will collect lab work for you today.  Increase your water daily.  I will order an MRI and they will call you to schedule.  Take your valium 1/2 tab at lunch and before bed. Start prednisone taper- at the end of the taper I prescribe resume her normal daily dose.   Benign Positional Vertigo Vertigo is the feeling that you or your surroundings are moving when they are not. Benign positional vertigo is the most common form of vertigo. This is usually a harmless condition (benign). This condition is positional. This means that symptoms are triggered by certain movements and positions. This condition can be dangerous if it occurs while you are doing something that could cause harm to you or others. This includes activities such as driving or operating machinery. What are the causes? In many cases, the cause of this condition is not known. It may be caused by a disturbance in an area of the inner ear that helps your brain to sense movement and balance. This disturbance can be caused by:  Viral infection (labyrinthitis).  Head injury.  Repetitive motion, such as jumping, dancing, or running. What increases the risk? You are more likely to develop this condition if:  You are a woman.  You are 20 years of age or older. What are the signs or symptoms? Symptoms of this condition usually happen when you move your head or your eyes in different directions. Symptoms may start suddenly, and usually last for less than a minute. They include:  Loss of balance and falling.  Feeling like you are spinning or moving.  Feeling like your surroundings are spinning or moving.  Nausea and vomiting.  Blurred vision.  Dizziness.  Involuntary eye movement (nystagmus). Symptoms can be mild and cause only minor problems, or they can be severe and interfere with daily life. Episodes of benign positional vertigo may return (recur) over time. Symptoms may improve over time. How is this diagnosed? This  condition may be diagnosed based on:  Your medical history.  Physical exam of the head, neck, and ears.  Tests, such as: ? MRI. ? CT scan. ? Eye movement tests. Your health care provider may ask you to change positions quickly while he or she watches you for symptoms of benign positional vertigo, such as nystagmus. Eye movement may be tested with a variety of exams that are designed to evaluate or stimulate vertigo. ? An electroencephalogram (EEG). This records electrical activity in your brain. ? Hearing tests. You may be referred to a health care provider who specializes in ear, nose, and throat (ENT) problems (otolaryngologist) or a provider who specializes in disorders of the nervous system (neurologist). How is this treated?  This condition may be treated in a session in which your health care provider moves your head in specific positions to adjust your inner ear back to normal. Treatment for this condition may take several sessions. Surgery may be needed in severe cases, but this is rare. In some cases, benign positional vertigo may resolve on its own in 2-4 weeks. Follow these instructions at home: Safety  Move slowly. Avoid sudden body or head movements or certain positions, as told by your health care provider.  Avoid driving until your health care provider says it is safe for you to do so.  Avoid operating heavy machinery until your health care provider says it is safe for you to do so.  Avoid doing any tasks that would be dangerous to you or others if vertigo occurs.  If you have trouble walking or keeping your balance, try using a cane for stability. If you feel dizzy or unstable, sit down right away.  Return to your normal activities as told by your health care provider. Ask your health care provider what activities are safe for you. General instructions  Take over-the-counter and prescription medicines only as told by your health care provider.  Drink enough fluid  to keep your urine pale yellow.  Keep all follow-up visits as told by your health care provider. This is important. Contact a health care provider if:  You have a fever.  Your condition gets worse or you develop new symptoms.  Your family or friends notice any behavioral changes.  You have nausea or vomiting that gets worse.  You have numbness or a "pins and needles" sensation. Get help right away if you:  Have difficulty speaking or moving.  Are always dizzy.  Faint.  Develop severe headaches.  Have weakness in your legs or arms.  Have changes in your hearing or vision.  Develop a stiff neck.  Develop sensitivity to light. Summary  Vertigo is the feeling that you or your surroundings are moving when they are not. Benign positional vertigo is the most common form of vertigo.  The cause of this condition is not known. It may be caused by a disturbance in an area of the inner ear that helps your brain to sense movement and balance.  Symptoms include loss of balance and falling, feeling that you or your surroundings are moving, nausea and vomiting, and blurred vision.  This condition can be diagnosed based on symptoms, physical exam, and other tests, such as MRI, CT scan, eye movement tests, and hearing tests.  Follow safety instructions as told by your health care provider. You will also be told when to contact your health care provider in case of problems. This information is not intended to replace advice given to you by your health care provider. Make sure you discuss any questions you have with your health care provider. Document Revised: 08/13/2017 Document Reviewed: 08/13/2017 Elsevier Patient Education  [place].

## 2020-02-22 LAB — VITAMIN B12: Vitamin B-12: 586 pg/mL (ref 211–911)

## 2020-02-22 LAB — CBC WITH DIFFERENTIAL/PLATELET
Basophils Absolute: 0.1 10*3/uL (ref 0.0–0.1)
Basophils Relative: 0.8 % (ref 0.0–3.0)
Eosinophils Absolute: 0.1 10*3/uL (ref 0.0–0.7)
Eosinophils Relative: 1.6 % (ref 0.0–5.0)
HCT: 46.7 % — ABNORMAL HIGH (ref 36.0–46.0)
Hemoglobin: 15.5 g/dL — ABNORMAL HIGH (ref 12.0–15.0)
Lymphocytes Relative: 15.1 % (ref 12.0–46.0)
Lymphs Abs: 1.2 10*3/uL (ref 0.7–4.0)
MCHC: 33.2 g/dL (ref 30.0–36.0)
MCV: 95.5 fl (ref 78.0–100.0)
Monocytes Absolute: 0.4 10*3/uL (ref 0.1–1.0)
Monocytes Relative: 5 % (ref 3.0–12.0)
Neutro Abs: 6.2 10*3/uL (ref 1.4–7.7)
Neutrophils Relative %: 77.5 % — ABNORMAL HIGH (ref 43.0–77.0)
Platelets: 188 10*3/uL (ref 150.0–400.0)
RBC: 4.89 Mil/uL (ref 3.87–5.11)
RDW: 14.6 % (ref 11.5–15.5)
WBC: 8 10*3/uL (ref 4.0–10.5)

## 2020-02-22 LAB — COMPREHENSIVE METABOLIC PANEL
ALT: 30 U/L (ref 0–35)
AST: 34 U/L (ref 0–37)
Albumin: 4.1 g/dL (ref 3.5–5.2)
Alkaline Phosphatase: 61 U/L (ref 39–117)
BUN: 20 mg/dL (ref 6–23)
CO2: 29 mEq/L (ref 19–32)
Calcium: 9.4 mg/dL (ref 8.4–10.5)
Chloride: 102 mEq/L (ref 96–112)
Creatinine, Ser: 0.93 mg/dL (ref 0.40–1.20)
GFR: 54.8 mL/min — ABNORMAL LOW (ref >60.00)
Glucose, Bld: 118 mg/dL — ABNORMAL HIGH (ref 70–99)
Potassium: 4.4 mEq/L (ref 3.5–5.1)
Sodium: 137 mEq/L (ref 135–145)
Total Bilirubin: 0.4 mg/dL (ref 0.2–1.2)
Total Protein: 6.3 g/dL (ref 6.0–8.3)

## 2020-02-22 LAB — MAGNESIUM: Magnesium: 2.1 mg/dL (ref 1.5–2.5)

## 2020-02-22 LAB — SEDIMENTATION RATE: Sed Rate: 20 mm/hr (ref 0–30)

## 2020-02-25 ENCOUNTER — Telehealth: Payer: Self-pay | Admitting: Family Medicine

## 2020-02-25 ENCOUNTER — Ambulatory Visit
Admission: RE | Admit: 2020-02-25 | Discharge: 2020-02-25 | Disposition: A | Discharge status: Home / Self Care | Payer: Medicare Other | Source: Ambulatory Visit | Attending: Family Medicine | Admitting: Family Medicine

## 2020-02-25 ENCOUNTER — Other Ambulatory Visit: Payer: Self-pay

## 2020-02-25 DIAGNOSIS — G319 Degenerative disease of nervous system, unspecified: Secondary | ICD-10-CM | POA: Diagnosis not present

## 2020-02-25 DIAGNOSIS — I6782 Cerebral ischemia: Secondary | ICD-10-CM | POA: Diagnosis not present

## 2020-02-25 DIAGNOSIS — G4452 New daily persistent headache (NDPH): Secondary | ICD-10-CM

## 2020-02-25 DIAGNOSIS — H539 Unspecified visual disturbance: Secondary | ICD-10-CM

## 2020-02-25 DIAGNOSIS — M542 Cervicalgia: Secondary | ICD-10-CM

## 2020-02-25 DIAGNOSIS — J3489 Other specified disorders of nose and nasal sinuses: Secondary | ICD-10-CM | POA: Diagnosis not present

## 2020-02-25 DIAGNOSIS — H81399 Other peripheral vertigo, unspecified ear: Secondary | ICD-10-CM

## 2020-02-25 DIAGNOSIS — R42 Dizziness and giddiness: Secondary | ICD-10-CM | POA: Diagnosis not present

## 2020-02-25 MED ORDER — GADOBENATE DIMEGLUMINE 529 MG/ML IV SOLN
15.0000 mL | Freq: Once | INTRAVENOUS | Status: AC | PRN
  Administered 2020-02-25: 15 mL via INTRAVENOUS

## 2020-02-25 MED ORDER — GADOBENATE DIMEGLUMINE 529 MG/ML IV SOLN: 17.0000 mL | Freq: Once | INTRAVENOUS | Status: DC | PRN

## 2020-02-25 NOTE — Telephone Encounter (Signed)
Spoke with pt daughter regarding labs and instructions. Pt has not seen much improvement

## 2020-02-25 NOTE — Telephone Encounter (Addendum)
Please inform patient's daughter the following information: The MRI of her Brain overall looked good and reassuring.  There was no evidence of any acute intracranial/brain abnormality. There was mild to moderate chronic small vessel disease.  This is common as one ages and has hypertension and cholesterol issues.  The only treatment for this is to maintain good blood pressure and cholesterol levels.  There was also mild thickening in her sinuses which usually means remaining sinus infection or aftereffects of a acute infection.   It is possible some of the sinus findings could be contributing to her dizziness, but would not expect the above findings to create as much dizziness or pain that she is experiencing.   I have referred her to neurology for further evaluation of the dizziness, headaches and neck pain. In the meantime complete the steroid taper and take the Valium as we discussed.  Hopefully these are helping with her symptoms.

## 2020-02-28 ENCOUNTER — Encounter: Payer: Self-pay | Admitting: Neurology

## 2020-02-29 ENCOUNTER — Other Ambulatory Visit: Payer: Medicare Other

## 2020-03-06 DIAGNOSIS — M316 Other giant cell arteritis: Secondary | ICD-10-CM | POA: Diagnosis not present

## 2020-03-13 NOTE — Progress Notes (Signed)
NEUROLOGY CONSULTATION NOTE  [name] MRN: [id number] DOB: 04/18/1929  Referring provider: Howard Pouch, DO Primary care provider: Howard Pouch, DO  Reason for consult:  Headache, vertigo, visual changes   Subjective:  [name] is an 86 year old female with temporal arteritis, HTN, HLD, depression, anxiety and fibromyalgia and hsitory of melanoma who presents for headaches.  History supplemented by rheumatology and referring provider's notes.  She is accompanied by her daughter who supplements history  She has history of polymyalgia rheumatica and giant cell arteritis, treated with long-term prednisone by rheumatology at [facility], currently down to 5mg  daily.  In November, she had a sinus infection.  She then developed sudden onset of vertigo described as spinning sensation/eyes jumping associated with double vision.  At first it occurred off and on for over an hour.  It occurs when she is watching TV or reading.  She needs to close her eyes and it aborts in a couple of minutes.  She also endorses right occipital aching pain that radiates down the right side of her neck and into the shoulder.  She also notes that her eyes easily water in the bright sun.  Sed rate on 02/21/2020 was 20. She had an MRI of the brain with and without contrast on 02/25/2020 personally reviewed showed mild generalized cerebral atrophy and mild to moderate chronic small vessel ischemic changes in the cerebral white matter and pons but no acute abnormality.  Meclizine helps.  She was prescribed a prednisone taper earlier this month which helped.  Overall, headache and neck pain improved but not resolved. Sometimes she notes an ache across the forehead.  Sometimes she notes a sensation of itching deep in her ears. Vertigo remains episodic and usually brief.    Current NSAIDS/analgesics/steroids:  Prednisone 5mg  daily, ASA 81mg  Current anti-emetic:  none Current muscle relaxants:  none Current Antihypertensive  medications:  olmesartan Current Antidepressant medications:  Fluoxetine 60mg  daily Current Antihistamines/Decongestants:  Meclizine 12.5mg  BID PRN   Past NSAIDS/analgesics:  tramadol Past muscle relaxants:  baclofen Past antihypertensive medications:  Amlodipine, losartan   PAST MEDICAL HISTORY: Past Medical History:  Diagnosis Date  . Abdominal bloating   . Abnormal LFTs (liver function tests)   . Ankle fracture, bimalleolar, closed, right, initial encounter   . Anxiety disorder   . Arthritis   . Asthma   . Depression   . Diverticulosis   . Edema   . Fatty liver   . Fibromyalgia   . GERD (gastroesophageal reflux disease)   . Giant cell arteritis (HCC)    No biopsy secondary to steroid use  . Hx of adenomatous colonic polyps   . Hyperlipidemia   . Hypertension   . IBS (irritable bowel syndrome)   . Lumbar radiculitis   . Osteoporosis 09/2017   T score -2.6 left femoral neck  . Peptic ulcer disease   . Skin cancer    shoulder, leg, right eye (? squamous by resection)  . Thoracic radiculitis   . Thyroid nodule    "removed"  . Urinary incontinence   . Vertigo     PAST SURGICAL HISTORY: Past Surgical History:  Procedure Laterality Date  . ABDOMINAL HYSTERECTOMY    . BACK SURGERY  2005   lower back  . EYE SURGERY Right 2006  . LIVER BIOPSY     fatty liver  . NASAL SEPTUM SURGERY    . ORIF ANKLE FRACTURE Right 01/11/2019   Procedure: OPEN REDUCTION INTERNAL FIXATION (ORIF) RIGHT BIMALLEOLAR ANKLE FRACTURE;  Surgeon: Leandrew Koyanagi, MD;  Location: [facility];  Service: Orthopedics;  Laterality: Right;  . right knee surgery  2006   arthroscopy  . right leg skin cancer surgery  2014  . right shoulder skin cancer excision  2012  . SKIN CANCER EXCISION    . THYROIDECTOMY      MEDICATIONS: Current Outpatient Medications on File Prior to Visit  Medication Sig Dispense Refill  . albuterol (PROAIR HFA) 108 (90 Base) MCG/ACT inhaler 2 puffs up to  every 4 hours if can't catch your breath 8 g 5  . alendronate (FOSAMAX) 70 MG tablet Take by mouth.    Marland Kitchen aspirin EC 81 MG tablet Take 1 tablet (81 mg total) by mouth 2 (two) times daily. 84 tablet 0  . budesonide-formoterol (SYMBICORT) 80-4.5 MCG/ACT inhaler USE 2 INHALATIONS TWICE A DAY 30.6 g 12  . calcium-vitamin D (OSCAL WITH D) 500-200 MG-UNIT tablet Take 1 tablet by mouth 3 (three) times daily. 90 tablet 6  . carbamide peroxide (DEBROX) 6.5 % OTIC solution Use once a month to keep cerumen form building up. 15 mL 0  . cefdinir (OMNICEF) 300 MG capsule Take 1 capsule (300 mg total) by mouth 2 (two) times daily. 20 capsule 0  . Cholecalciferol (VITAMIN D3) 20 MCG (800 UNIT) TABS Take 1 tablet by mouth daily. 90 tablet 3  . Cyanocobalamin (VITAMIN B-12) 5000 MCG SUBL Place 0.0001 tablets (0.5 mcg total) under the tongue daily. Place 2500MCG under the tongue daily. 1 tablet 11  . diazepam (VALIUM) 5 MG tablet 1/2 tab PO in the morning and 1 tab QHS PRN. 45 tablet 5  . FLUoxetine (PROZAC) 20 MG capsule Take 3 capsules (60 mg total) by mouth daily. 270 capsule 1  . magic mouthwash SOLN Take 10-15 mLs by mouth 3 (three) times daily as needed for mouth pain. 240 mL 5  . meclizine (ANTIVERT) 25 MG tablet Take 0.5 tablets (12.5 mg total) by mouth 2 (two) times daily as needed for dizziness. 30 tablet 1  . olmesartan (BENICAR) 40 MG tablet Take 1 tablet (40 mg total) by mouth daily. 90 tablet 1  . omega-3 acid ethyl esters (LOVAZA) 1 g capsule Take 2 capsules (2 g total) by mouth 2 (two) times daily. 360 capsule 3  . omeprazole (PRILOSEC) 40 MG capsule Take by mouth.    . predniSONE (DELTASONE) 20 MG tablet 60 mg x3d, 40 mg x3d, 20 mg x2d, 10 mg x2d 18 tablet 0  . predniSONE (DELTASONE) 5 MG tablet Take 5 mg by mouth daily.     Marland Kitchen tocilizumab (ACTEMRA) 400 MG/20ML SOLN injection Infuse 480 mg into the vein once every 28 days.    Marland Kitchen tocilizumab 4 mg/kg in sodium chloride 0.9 % Inject 280 mg into the vein.     Marland Kitchen traMADol (ULTRAM) 50 MG tablet TAKE ONE TABLET BY MOUTH EVERY 6 HOURS AS NEEDED FOR MODERATE PAIN 40 tablet 0  . triamcinolone (KENALOG) 0.025 % ointment Apply a very thin layer to affected area every 2 days. 30 g 0  . zinc sulfate 220 (50 Zn) MG capsule Take 1 capsule (220 mg total) by mouth daily. 42 capsule 0   No current facility-administered medications on file prior to visit.    ALLERGIES: Allergies  Allergen Reactions  . Atorvastatin Other (See Comments)    Severe headache  . Oxycodone Itching    hallucinations  . Statins   . Advair Diskus [Fluticasone-Salmeterol] Other (See Comments)  Mouth sores  . Sulfonamide Derivatives Rash    FAMILY HISTORY: Family History  Problem Relation Age of Onset  . Colon polyps Maternal Aunt   . Diabetes Father   . Heart disease Father   . Heart disease Mother   . Lung disease Mother   . COPD Mother   . Lung cancer Brother   . Cancer Brother        lung  . COPD Brother   . Stroke Brother   . Lung cancer Sister   . COPD Sister   . Kidney disease Other        niece-mat side  . Arthritis Other   . Diabetes Son   . Arthritis Daughter   . Colon cancer Neg Hx     SOCIAL HISTORY: Social History   Socioeconomic History  . Marital status: Widowed    Spouse name: Not on file  . Number of children: 3  . Years of education: Not on file  . Highest education level: Not on file  Occupational History  . 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 Smoking status: Never Smoker  . Smokeless tobacco: Never Used  Vaping Use  . Vaping Use: Never used  Substance and Sexual Activity  . Alcohol use: No  . Drug use: No  . Sexual activity: Not Currently    Comment: intercourser age 86, less than 5 secxual partners  Other Topics Concern  . Not on file  Social History Narrative   Married, husband Advice workerWildon earl Steele.    Lives with Maggie SchwalbeRandy, Gary and Synetta FailAnita.   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   Patient takes a daily vitamin    She wears her seat belt. Exercises greater than 3 times a week.   Wears 2 hearing aids. No dentures.   "Sometimes "requires assistive device for walking, either a walker or wheelchair.   There is a smoke detector in her home. There are firearms in her home. She feels safe in her relationships.   Social Determinants of Health   Financial Resource Strain: Not on file  Food Insecurity: Not on file  Transportation Needs: Not on file  Physical Activity: Not on file  Stress: Not on file  Social Connections: Not on file  Intimate Partner Violence: Not on file    Objective:  Blood pressure (!) 181/68, pulse 91, height 5\' 3"  (1.6 m), weight 194 lb (88 kg), SpO2 (!) 83 %. General: No acute distress.  Patient appears well-groomed.   Head:  Normocephalic/atraumatic, right occipital tenderness and tenderness of right TMJ Eyes:  fundi examined but not visualized Neck: supple, right-sided paraspinal tenderness, full range of motion Back: No paraspinal tenderness Heart: regular rate and rhythm Lungs: Clear to auscultation bilaterally. Vascular: No carotid bruits. Neurological Exam: Mental status: alert and oriented to person, place, and time, recent and remote memory intact, fund of knowledge intact, attention and concentration intact, speech fluent and not dysarthric, language intact. Cranial nerves: CN I: not tested CN II: pupils equal, round and reactive to light, visual fields intact CN III, IV, VI:  full range of motion, no nystagmus, no ptosis CN V: facial sensation intact. CN VII: upper and lower face symmetric CN VIII: hearing intact CN IX, X: gag intact, uvula midline CN XI: sternocleidomastoid and trapezius muscles intact CN XII: tongue midline Bulk & Tone: normal, no fasciculations. Motor:  muscle strength 5/5 throughout Sensation:  Pinprick sensation slightly reduced in hands and feet.  Vibratory sensation reduced in feet. Deep Tendon  Reflexes:  1+ throughout,  toes downgoing.    Finger to nose testing:  Without dysmetria.   Gait:  Wide-based unsteady gait.  Romberg with sway.  Assessment/Plan:   1.  Right occipital headache with right sided cervicalgia 2.  Episodic vertigo.  MRI negative for stroke. Given history of arthritis, may be due to cervical spondylosis.  History of PMR but on low-dose prednisone with normal sed rate.  Also consider TMJ dysfunction.  1.  Will get X-ray of cervical spine 2.  Pending results, would consider physical therapy for neck pain and vestibular rehab for possible cervicogenic dizziness 3.  Defer pharmacologic medications due to side effect profile.   4.  May continue to use meclizine but sparingly 5.  Follow up    Thank you for allowing me to take part in the care of this patient.  Shon Millet, DO  CC: Felix Pacini, DO

## 2020-03-14 ENCOUNTER — Encounter: Payer: Self-pay | Admitting: Neurology

## 2020-03-14 ENCOUNTER — Ambulatory Visit
Admission: RE | Admit: 2020-03-14 | Discharge: 2020-03-14 | Disposition: A | Discharge status: Home / Self Care | Payer: Medicare Other | Source: Ambulatory Visit | Attending: Neurology | Admitting: Neurology

## 2020-03-14 ENCOUNTER — Ambulatory Visit (INDEPENDENT_AMBULATORY_CARE_PROVIDER_SITE_OTHER): Payer: Medicare Other | Admitting: Neurology

## 2020-03-14 ENCOUNTER — Other Ambulatory Visit: Payer: Self-pay | Admitting: Neurology

## 2020-03-14 ENCOUNTER — Other Ambulatory Visit: Payer: Self-pay

## 2020-03-14 VITALS — BP 181/68 | HR 91 | Ht 63.0 in | Wt 194.0 lb

## 2020-03-14 DIAGNOSIS — M542 Cervicalgia: Secondary | ICD-10-CM

## 2020-03-14 DIAGNOSIS — R519 Headache, unspecified: Secondary | ICD-10-CM

## 2020-03-14 DIAGNOSIS — R42 Dizziness and giddiness: Secondary | ICD-10-CM

## 2020-03-14 NOTE — Patient Instructions (Signed)
Headache and dizziness may be due to arthritis in the neck Will check x-ray of cervical spine Consider physical therapy for neck pain and dizziness Use meclizine sparingly Follow up

## 2020-03-15 ENCOUNTER — Telehealth: Payer: Self-pay

## 2020-03-15 DIAGNOSIS — R42 Dizziness and giddiness: Secondary | ICD-10-CM

## 2020-03-15 DIAGNOSIS — M542 Cervicalgia: Secondary | ICD-10-CM

## 2020-03-15 NOTE — Telephone Encounter (Signed)
-----   Message from Drema Dallas, DO sent at 03/15/2020 12:23 PM EST ----- X-ray does show arthritis in the neck.  Would proceed with physical therapy (for neck pain, vestibular rehab)

## 2020-03-15 NOTE — Telephone Encounter (Signed)
Spoke with pt daughter informed her that X-ray does show arthritis in the neck. Would proceed with physical therapy PT referral placed in Epic

## 2020-03-21 ENCOUNTER — Telehealth: Payer: Self-pay | Admitting: Neurology

## 2020-03-21 NOTE — Telephone Encounter (Signed)
Office notes and Imaging has been faxed to [facility] as requested.

## 2020-03-21 NOTE — Telephone Encounter (Signed)
Patients referring providers office is requesting a copy of the notes and scans from her visit with Dr Everlena Cooper she had recently. They stated they normally receive them but haven't gotten a copy yet and patient has a follow up visit 03/22/20 @ 10am, they would like the notes prior to her appointment if possible. You can fax them to [phone] Thanks!

## 2020-03-30 DIAGNOSIS — H02043 Spastic entropion of right eye, unspecified eyelid: Secondary | ICD-10-CM | POA: Diagnosis not present

## 2020-03-30 DIAGNOSIS — H353132 Nonexudative age-related macular degeneration, bilateral, intermediate dry stage: Secondary | ICD-10-CM | POA: Diagnosis not present

## 2020-03-30 DIAGNOSIS — H538 Other visual disturbances: Secondary | ICD-10-CM | POA: Diagnosis not present

## 2020-03-30 DIAGNOSIS — M316 Other giant cell arteritis: Secondary | ICD-10-CM | POA: Diagnosis not present

## 2020-03-30 LAB — HM DIABETES EYE EXAM

## 2020-04-03 ENCOUNTER — Ambulatory Visit: Payer: Medicare Other | Admitting: Neurology

## 2020-04-06 DIAGNOSIS — M316 Other giant cell arteritis: Secondary | ICD-10-CM | POA: Diagnosis not present

## 2020-04-11 ENCOUNTER — Telehealth: Payer: Self-pay | Admitting: Family Medicine

## 2020-04-18 ENCOUNTER — Telehealth: Payer: Self-pay

## 2020-04-18 ENCOUNTER — Other Ambulatory Visit: Payer: Self-pay

## 2020-04-18 DIAGNOSIS — E781 Pure hyperglyceridemia: Secondary | ICD-10-CM

## 2020-04-18 MED ORDER — OMEGA-3-ACID ETHYL ESTERS 1 G PO CAPS: 2.0000 g | ORAL_CAPSULE | Freq: Two times a day (BID) | ORAL | 1 refills | Status: DC

## 2020-04-18 NOTE — Telephone Encounter (Signed)
Patient daughter (DPR) calling to request new prescription.  [facility] stating the one on file has expired.   omega-3 acid ethyl esters (LOVAZA) 1 g capsule [[id number]]    [facility] - [place], [place]

## 2020-04-18 NOTE — Telephone Encounter (Signed)
Rx sent for 6 month

## 2020-05-04 DIAGNOSIS — M316 Other giant cell arteritis: Secondary | ICD-10-CM | POA: Diagnosis not present

## 2020-05-09 NOTE — Progress Notes (Signed)
Subjective:   [name] is a 87 y.o. female who presents for Medicare Annual (Subsequent) preventive examination.  I connected with Rewa today by telephone and verified that I am speaking with the correct person using two identifiers. Location patient: home Location provider: work Persons participating in the virtual visit: patient, daughter,nurse.    I discussed the limitations, risks, security and privacy concerns of performing an evaluation and management service by telephone and the availability of in person appointments. I also discussed with the patient that there may be a patient responsible charge related to this service. The patient expressed understanding and verbally consented to this telephonic visit.    Interactive audio and video telecommunications were attempted between this provider and patient, however failed, due to patient having technical difficulties OR patient did not have access to video capability.  We continued and completed visit with audio only.  Some vital signs may be absent or patient reported.   Time Spent with patient on telephone encounter: 30 minutes    Review of Systems     Cardiac Risk Factors include: advanced age (>40men, >71 women);hypertension;dyslipidemia;sedentary lifestyle;obesity (BMI >30kg/m2)     Objective:    Today's Vitals   05/10/20 1032  Weight: 194 lb (88 kg)  Height: 5\' 3"  (1.6 m)   Body mass index is 34.37 kg/m.  Advanced Directives 05/10/2020 03/14/2020 01/11/2019 01/06/2019 01/05/2019 07/15/2017 01/17/2017  Does Patient Have a Medical Advance Directive? Yes Yes Yes No Yes No No  Type of Paramedic of [place];Living will [place];Living will Healthcare Power of [place] - -  Does patient want to make changes to medical advance directive? - - No - Patient declined - - - -  Copy of [place] in Chart? No - copy requested - No -  copy requested - - - -  Would patient like information on creating a medical advance directive? - - No - Patient declined No - Patient declined - Yes (MAU/Ambulatory/Procedural Areas - Information given) -    Current Medications (verified) Outpatient Encounter Medications as of 05/10/2020  Medication Sig  . albuterol (PROAIR HFA) 108 (90 Base) MCG/ACT inhaler 2 puffs up to every 4 hours if can't catch your breath  . alendronate (FOSAMAX) 70 MG tablet Take by mouth.  Marland Kitchen aspirin EC 81 MG tablet Take 1 tablet (81 mg total) by mouth 2 (two) times daily.  . budesonide-formoterol (SYMBICORT) 80-4.5 MCG/ACT inhaler USE 2 INHALATIONS TWICE A DAY  . calcium-vitamin D (OSCAL WITH D) 500-200 MG-UNIT tablet Take 1 tablet by mouth 3 (three) times daily.  . carbamide peroxide (DEBROX) 6.5 % OTIC solution Use once a month to keep cerumen form building up.  . cefdinir (OMNICEF) 300 MG capsule Take 1 capsule (300 mg total) by mouth 2 (two) times daily.  . Cholecalciferol (VITAMIN D3) 20 MCG (800 UNIT) TABS Take 1 tablet by mouth daily.  . Cyanocobalamin (VITAMIN B-12) 5000 MCG SUBL Place 0.0001 tablets (0.5 mcg total) under the tongue daily. Place 2500MCG under the tongue daily.  . diazepam (VALIUM) 5 MG tablet 1/2 tab PO in the morning and 1 tab QHS PRN.  Marland Kitchen FLUoxetine (PROZAC) 20 MG capsule Take 3 capsules (60 mg total) by mouth daily.  . magic mouthwash SOLN Take 10-15 mLs by mouth 3 (three) times daily as needed for mouth pain.  . meclizine (ANTIVERT) 25 MG tablet Take 0.5 tablets (12.5 mg total) by mouth 2 (two) times  daily as needed for dizziness.  Marland Kitchen omega-3 acid ethyl esters (LOVAZA) 1 g capsule Take 2 capsules (2 g total) by mouth 2 (two) times daily.  Marland Kitchen omeprazole (PRILOSEC) 40 MG capsule Take by mouth.  . predniSONE (DELTASONE) 20 MG tablet 60 mg x3d, 40 mg x3d, 20 mg x2d, 10 mg x2d  . predniSONE (DELTASONE) 5 MG tablet Take 5 mg by mouth daily.   Marland Kitchen tocilizumab (ACTEMRA) 400 MG/20ML SOLN injection  Infuse 480 mg into the vein once every 28 days.  Marland Kitchen tocilizumab 4 mg/kg in sodium chloride 0.9 % Inject 280 mg into the vein.  Marland Kitchen traMADol (ULTRAM) 50 MG tablet TAKE ONE TABLET BY MOUTH EVERY 6 HOURS AS NEEDED FOR MODERATE PAIN  . triamcinolone (KENALOG) 0.025 % ointment Apply a very thin layer to affected area every 2 days.  Marland Kitchen zinc sulfate 220 (50 Zn) MG capsule Take 1 capsule (220 mg total) by mouth daily.  Marland Kitchen olmesartan (BENICAR) 40 MG tablet Take 1 tablet (40 mg total) by mouth daily.   No facility-administered encounter medications on file as of 05/10/2020.    Allergies (verified) Atorvastatin, Oxycodone, Statins, Advair diskus [fluticasone-salmeterol], and Sulfonamide derivatives   History: Past Medical History:  Diagnosis Date  . Abdominal bloating   . Abnormal LFTs (liver function tests)   . Ankle fracture, bimalleolar, closed, right, initial encounter   . Anxiety disorder   . Arthritis   . Asthma   . Depression   . Diverticulosis   . Edema   . Fatty liver   . Fibromyalgia   . GERD (gastroesophageal reflux disease)   . Giant cell arteritis (HCC)    No biopsy secondary to steroid use  . Hx of adenomatous colonic polyps   . Hyperlipidemia   . Hypertension   . IBS (irritable bowel syndrome)   . Lumbar radiculitis   . Osteoporosis 09/2017   T score -2.6 left femoral neck  . Peptic ulcer disease   . Skin cancer    shoulder, leg, right eye (? squamous by resection)  . Thoracic radiculitis   . Thyroid nodule    "removed"  . Urinary incontinence   . Vertigo    Past Surgical History:  Procedure Laterality Date  . ABDOMINAL HYSTERECTOMY    . BACK SURGERY  2005   lower back  . EYE SURGERY Right 2006  . LIVER BIOPSY     fatty liver  . NASAL SEPTUM SURGERY    . ORIF ANKLE FRACTURE Right 01/11/2019   Procedure: OPEN REDUCTION INTERNAL FIXATION (ORIF) RIGHT BIMALLEOLAR ANKLE FRACTURE;  Surgeon: Leandrew Koyanagi, MD;  Location: [facility];  Service:  Orthopedics;  Laterality: Right;  . right knee surgery  2006   arthroscopy  . right leg skin cancer surgery  2014  . right shoulder skin cancer excision  2012  . SKIN CANCER EXCISION    . THYROIDECTOMY     Family History  Problem Relation Age of Onset  . Colon polyps Maternal Aunt   . Diabetes Father   . Heart disease Father   . Heart disease Mother   . Lung disease Mother   . COPD Mother   . Lung cancer Brother   . Cancer Brother        lung  . COPD Brother   . Stroke Brother   . Lung cancer Sister   . COPD Sister   . Kidney disease Other        niece-mat side  . Arthritis  Other   . Diabetes Son   . Arthritis Daughter   . Colon cancer Neg Hx    Social History   Socioeconomic History  . Marital status: Widowed    Spouse name: Not on file  . Number of children: 3  . Years of education: Not on file  . Highest education level: Not on file  Occupational History  . Occupation: Retired Water engineer  Tobacco Use  . Smoking status: Never Smoker  . Smokeless tobacco: Never Used  Vaping Use  . Vaping Use: Never used  Substance and Sexual Activity  . Alcohol use: No  . Drug use: No  . Sexual activity: Not Currently    Comment: intercourser age 78, less than 5 secxual partners  Other Topics Concern  . Not on file  Social History Narrative   Married, husband Armed forces training and education officer.    Lives with [name] and [name].   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   Patient takes a daily vitamin   She wears her seat belt. Exercises greater than 3 times a week.   Wears 2 hearing aids. No dentures.   "Sometimes "requires assistive device for walking, either a walker or wheelchair.   There is a smoke detector in her home. There are firearms in her home. She feels safe in her relationships.   Right handed   Social Determinants of Health   Financial Resource Strain: Low Risk   . Difficulty of Paying Living Expenses: Not hard at all  Food Insecurity: No Food Insecurity  .  Worried About Charity fundraiser in the Last Year: Never true  . Ran Out of Food in the Last Year: Never true  Transportation Needs: No Transportation Needs  . Lack of Transportation (Medical): No  . Lack of Transportation (Non-Medical): No  Physical Activity: Inactive  . Days of Exercise per Week: 0 days  . Minutes of Exercise per Session: 0 min  Stress: No Stress Concern Present  . Feeling of Stress : Not at all  Social Connections: Socially Isolated  . Frequency of Communication with Friends and Family: More than three times a week  . Frequency of Social Gatherings with Friends and Family: More than three times a week  . Attends Religious Services: Never  . Active Member of Clubs or Organizations: No  . Attends Archivist Meetings: Never  . Marital Status: Widowed    Tobacco Counseling Counseling given: Not Answered   Clinical Intake:  Pre-visit preparation completed: Yes  Pain : No/denies pain     Nutritional Status: BMI > 30  Obese Nutritional Risks: None Diabetes: No  How often do you need to have someone help you when you read instructions, pamphlets, or other written materials from your doctor or pharmacy?: 1 - Never  Diabetic?No  Interpreter Needed?: No  Information entered by :: Caroleen Hamman LPN   Activities of Daily Living In your present state of health, do you have any difficulty performing the following activities: 05/10/2020  Hearing? Y  Comment hearing aids  Vision? N  Difficulty concentrating or making decisions? Y  Comment sometimes  Walking or climbing stairs? Y  Dressing or bathing? N  Doing errands, shopping? Y  Preparing Food and eating ? N  Using the Toilet? N  In the past six months, have you accidently leaked urine? Y  Comment wears depends  Do you have problems with loss of bowel control? N  Managing your Medications? Y  Managing your Finances? Darreld Mclean  Housekeeping or managing your Housekeeping? Y  Some recent data might  be hidden    Patient Care Team: Ma Hillock, DO as PCP - General (Family Medicine) Hermelinda Medicus, MD (Internal Medicine) Fay Records, MD as Consulting Physician (Cardiology) Lafayette Dragon, MD (Inactive) (Gastroenterology) Tanda Rockers, MD as Consulting Physician (Pulmonary Disease) Jessy Oto, MD as Consulting Physician (Orthopedic Surgery) Magnus Sinning, MD as Consulting Physician (Physical Medicine and Rehabilitation) [facility] (Dentistry)  Indicate any recent Medical Services you may have received from other than [facility] providers in the past year (date may be approximate).     Assessment:   This is a routine wellness examination for Shekela.  Hearing/Vision screen  Hearing Screening   125Hz  250Hz  500Hz  1000Hz  2000Hz  3000Hz  4000Hz  6000Hz  8000Hz   Right ear:           Left ear:           Comments: Bilateral hearing aids  Vision Screening Comments: Last eye exam-04/2020-Dr. Herbert Deaner  Dietary issues and exercise activities discussed: Current Exercise Habits: The patient does not participate in regular exercise at present, Exercise limited by: Other - see comments (impaired mobility)  Goals    . Increase physical activity     Improve walking ability and distance.     . Patient Stated     Continue current healthy eating      Depression Screen PHQ 2/9 Scores 05/10/2020 01/17/2020 07/28/2018 01/13/2018 07/15/2017 07/15/2017 04/01/2017  PHQ - 2 Score 0 0 1 1 0 0 0  PHQ- 9 Score - - 6 4 0 1 0    Fall Risk Fall Risk  05/10/2020 03/14/2020 01/17/2020 10/19/2018 07/15/2017  Falls in the past year? 1 0 0 1 Yes  Comment - - - Emmi Telephone Survey: data to providers prior to load -  Number falls in past yr: 1 0 0 1 2 or more  Comment - - - Emmi Telephone Survey Actual Response = 2 -  Injury with Fall? 0 0 0 0 No  Risk Factor Category  - - - - High Fall Risk  Risk for fall due to : History of fall(s) - - - History of fall(s);Impaired balance/gait   Follow up Falls prevention discussed - - - Falls prevention discussed    FALL RISK PREVENTION PERTAINING TO THE HOME:  Any stairs in or around the home? Yes  If so, are there any without handrails? No  Home free of loose throw rugs in walkways, pet beds, electrical cords, etc? Yes  Adequate lighting in your home to reduce risk of falls? Yes   ASSISTIVE DEVICES UTILIZED TO PREVENT FALLS:  Life alert? No  Use of a cane, walker or w/c? Yes  Grab bars in the bathroom? Yes  Shower chair or bench in shower? Yes  Elevated toilet seat or a handicapped toilet? No   TIMED UP AND GO:  Was the test performed? No . Phone visit   Cognitive Function: MMSE - Mini Mental State Exam 07/15/2017 07/12/2016  Not completed: - Refused  Orientation to time 5 -  Orientation to Place 5 -  Registration 3 -  Attention/ Calculation 3 -  Recall 1 -  Language- name 2 objects 2 -  Language- repeat 0 -  Language- follow 3 step command 3 -  Language- read & follow direction 1 -  Write a sentence 1 -  Copy design 1 -  Total score 25 -     6CIT Screen 05/10/2020  What Year? 0 points  What month? 0 points  What time? 0 points  Count back from 20 0 points  Months in reverse 0 points  Repeat phrase 8 points  Total Score 8    Immunizations Immunization History  Administered Date(s) Administered  . Fluad Quad(high Dose 65+) 12/22/2018, 01/17/2020  . Influenza Split 12/16/2012, 12/30/2013  . Influenza Whole 12/17/2011  . Influenza, High Dose Seasonal PF 12/12/2015, 12/24/2016, 01/01/2018  . PFIZER(Purple Top)SARS-COV-2 Vaccination 10/25/2019, 11/15/2019  . Pneumococcal Conjugate-13 12/17/2010  . Pneumococcal Polysaccharide-23 06/29/2008, 12/30/2013  . Tdap 04/11/2015  . Zoster Recombinat (Shingrix) 09/09/2017, 01/01/2018    TDAP status: Up to date  Flu Vaccine status: Up to date  Pneumococcal vaccine status: Up to date  Covid-19 vaccine status: Completed vaccines  Qualifies for Shingles  Vaccine? No   Zostavax completed No   Shingrix Completed?: Yes  Screening Tests Health Maintenance  Topic Date Due  . DEXA SCAN  10/02/2019  . COVID-19 Vaccine (3 - Booster for Pfizer series) 05/15/2020  . TETANUS/TDAP  04/10/2025  . INFLUENZA VACCINE  Completed  . PNA vac Low Risk Adult  Completed    Health Maintenance  Health Maintenance Due  Topic Date Due  . DEXA SCAN  10/02/2019    Colorectal cancer screening: No longer required.   Mammogram status: No longer required due to patient declined .  Bone Density status: Ordered today. Pt provided with contact info and advised to call to schedule appt.  Lung Cancer Screening: (Low Dose CT Chest recommended if Age 82-80 years, 30 pack-year currently smoking OR have quit w/in 15years.) does not qualify.    Additional Screening:  Hepatitis C Screening: does not qualify  Vision Screening: Recommended annual ophthalmology exams for early detection of glaucoma and other disorders of the eye. Is the patient up to date with their annual eye exam?  Yes  Who is the provider or what is the name of the office in which the patient attends annual eye exams? Dr. Herbert Deaner   Dental Screening: Recommended annual dental exams for proper oral hygiene  Community Resource Referral / Chronic Care Management: CRR required this visit?  No   CCM required this visit?  No      Plan:     I have personally reviewed and noted the following in the patient's chart:   . Medical and social history . Use of alcohol, tobacco or illicit drugs  . Current medications and supplements . Functional ability and status . Nutritional status . Physical activity . Advanced directives . List of other physicians . Hospitalizations, surgeries, and ER visits in previous 12 months . Vitals . Screenings to include cognitive, depression, and falls . Referrals and appointments  In addition, I have reviewed and discussed with patient certain preventive  protocols, quality metrics, and best practice recommendations. A written personalized care plan for preventive services as well as general preventive health recommendations were provided to patient.   Due to this being a telephonic visit, the after visit summary with patients personalized plan was offered to patient via mail or my-chart.  Patient would like to access on my-chart.  Marta Antu, LPN   [id number]  Nurse Health Advisor  Nurse Notes: Patient requesting refill of Olmesartan.

## 2020-05-10 ENCOUNTER — Telehealth: Payer: Self-pay

## 2020-05-10 ENCOUNTER — Ambulatory Visit (INDEPENDENT_AMBULATORY_CARE_PROVIDER_SITE_OTHER): Payer: Medicare Other

## 2020-05-10 VITALS — Ht 63.0 in | Wt 194.0 lb

## 2020-05-10 DIAGNOSIS — Z780 Asymptomatic menopausal state: Secondary | ICD-10-CM

## 2020-05-10 DIAGNOSIS — Z0000 Encounter for general adult medical examination without abnormal findings: Secondary | ICD-10-CM

## 2020-05-10 NOTE — Telephone Encounter (Signed)
Patient is requesting a refill of Olmesartan-90 day supply. Uses [facility]

## 2020-05-10 NOTE — Patient Instructions (Signed)
[name] , Thank you for taking time to complete your Medicare Wellness Visit. I appreciate your ongoing commitment to your health goals. Please review the following plan we discussed and let me know if I can assist you in the future.   Screening recommendations/referrals: Colonoscopy: No longer required Mammogram: Declined Bone Density: Ordered today. Someone will be calling you to schedule Recommended yearly ophthalmology/optometry visit for glaucoma screening and checkup Recommended yearly dental visit for hygiene and checkup  Vaccinations: Influenza vaccine: Up to date Pneumococcal vaccine: Completed vaccines Tdap vaccine: Up to date-Due-04/10/2025 Shingles vaccine: Completed vaccines Covid-19:Completed 2 doses.  Advanced directives: Please bring a copy for your chart  Conditions/risks identified: See problem list  Next appointment: Follow up in one year for your annual wellness visit    Preventive Care 85 Years and Older, Female Preventive care refers to lifestyle choices and visits with your health care provider that can promote health and wellness. What does preventive care include?  A yearly physical exam. This is also called an annual well check.  Dental exams once or twice a year.  Routine eye exams. Ask your health care provider how often you should have your eyes checked.  Personal lifestyle choices, including:  Daily care of your teeth and gums.  Regular physical activity.  Eating a healthy diet.  Avoiding tobacco and drug use.  Limiting alcohol use.  Practicing safe sex.  Taking low-dose aspirin every day.  Taking vitamin and mineral supplements as recommended by your health care provider. What happens during an annual well check? The services and screenings done by your health care provider during your annual well check will depend on your age, overall health, lifestyle risk factors, and family history of disease. Counseling  Your health care  provider may ask you questions about your:  Alcohol use.  Tobacco use.  Drug use.  Emotional well-being.  Home and relationship well-being.  Sexual activity.  Eating habits.  History of falls.  Memory and ability to understand (cognition).  Work and work Statistician.  Reproductive health. Screening  You may have the following tests or measurements:  Height, weight, and BMI.  Blood pressure.  Lipid and cholesterol levels. These may be checked every 5 years, or more frequently if you are over 40 years old.  Skin check.  Lung cancer screening. You may have this screening every year starting at age 40 if you have a 30-pack-year history of smoking and currently smoke or have quit within the past 15 years.  Fecal occult blood test (FOBT) of the stool. You may have this test every year starting at age 42.  Flexible sigmoidoscopy or colonoscopy. You may have a sigmoidoscopy every 5 years or a colonoscopy every 10 years starting at age 16.  Hepatitis C blood test.  Hepatitis B blood test.  Sexually transmitted disease (STD) testing.  Diabetes screening. This is done by checking your blood sugar (glucose) after you have not eaten for a while (fasting). You may have this done every 1-3 years.  Bone density scan. This is done to screen for osteoporosis. You may have this done starting at age 40.  Mammogram. This may be done every 1-2 years. Talk to your health care provider about how often you should have regular mammograms. Talk with your health care provider about your test results, treatment options, and if necessary, the need for more tests. Vaccines  Your health care provider may recommend certain vaccines, such as:  Influenza vaccine. This is recommended every year.  Tetanus, diphtheria, and acellular pertussis (Tdap, Td) vaccine. You may need a Td booster every 10 years.  Zoster vaccine. You may need this after age 54.  Pneumococcal 13-valent conjugate (PCV13)  vaccine. One dose is recommended after age 69.  Pneumococcal polysaccharide (PPSV23) vaccine. One dose is recommended after age 66. Talk to your health care provider about which screenings and vaccines you need and how often you need them. This information is not intended to replace advice given to you by your health care provider. Make sure you discuss any questions you have with your health care provider. Document Released: 03/31/2015 Document Revised: 11/22/2015 Document Reviewed: 01/03/2015 Elsevier Interactive Patient Education  2017 [place] Prevention in the Home Falls can cause injuries. They can happen to people of all ages. There are many things you can do to make your home safe and to help prevent falls. What can I do on the outside of my home?  Regularly fix the edges of walkways and driveways and fix any cracks.  Remove anything that might make you trip as you walk through a door, such as a raised step or threshold.  Trim any bushes or trees on the path to your home.  Use bright outdoor lighting.  Clear any walking paths of anything that might make someone trip, such as rocks or tools.  Regularly check to see if handrails are loose or broken. Make sure that both sides of any steps have handrails.  Any raised decks and porches should have guardrails on the edges.  Have any leaves, snow, or ice cleared regularly.  Use sand or salt on walking paths during winter.  Clean up any spills in your garage right away. This includes oil or grease spills. What can I do in the bathroom?  Use night lights.  Install grab bars by the toilet and in the tub and shower. Do not use towel bars as grab bars.  Use non-skid mats or decals in the tub or shower.  If you need to sit down in the shower, use a plastic, non-slip stool.  Keep the floor dry. Clean up any water that spills on the floor as soon as it happens.  Remove soap buildup in the tub or shower  regularly.  Attach bath mats securely with double-sided non-slip rug tape.  Do not have throw rugs and other things on the floor that can make you trip. What can I do in the bedroom?  Use night lights.  Make sure that you have a light by your bed that is easy to reach.  Do not use any sheets or blankets that are too big for your bed. They should not hang down onto the floor.  Have a firm chair that has side arms. You can use this for support while you get dressed.  Do not have throw rugs and other things on the floor that can make you trip. What can I do in the kitchen?  Clean up any spills right away.  Avoid walking on wet floors.  Keep items that you use a lot in easy-to-reach places.  If you need to reach something above you, use a strong step stool that has a grab bar.  Keep electrical cords out of the way.  Do not use floor polish or wax that makes floors slippery. If you must use wax, use non-skid floor wax.  Do not have throw rugs and other things on the floor that can make you trip. What can I do  with my stairs?  Do not leave any items on the stairs.  Make sure that there are handrails on both sides of the stairs and use them. Fix handrails that are broken or loose. Make sure that handrails are as long as the stairways.  Check any carpeting to make sure that it is firmly attached to the stairs. Fix any carpet that is loose or worn.  Avoid having throw rugs at the top or bottom of the stairs. If you do have throw rugs, attach them to the floor with carpet tape.  Make sure that you have a light switch at the top of the stairs and the bottom of the stairs. If you do not have them, ask someone to add them for you. What else can I do to help prevent falls?  Wear shoes that:  Do not have high heels.  Have rubber bottoms.  Are comfortable and fit you well.  Are closed at the toe. Do not wear sandals.  If you use a stepladder:  Make sure that it is fully  opened. Do not climb a closed stepladder.  Make sure that both sides of the stepladder are locked into place.  Ask someone to hold it for you, if possible.  Clearly mark and make sure that you can see:  Any grab bars or handrails.  First and last steps.  Where the edge of each step is.  Use tools that help you move around (mobility aids) if they are needed. These include:  Canes.  Walkers.  Scooters.  Crutches.  Turn on the lights when you go into a dark area. Replace any light bulbs as soon as they burn out.  Set up your furniture so you have a clear path. Avoid moving your furniture around.  If any of your floors are uneven, fix them.  If there are any pets around you, be aware of where they are.  Review your medicines with your doctor. Some medicines can make you feel dizzy. This can increase your chance of falling. Ask your doctor what other things that you can do to help prevent falls. This information is not intended to replace advice given to you by your health care provider. Make sure you discuss any questions you have with your health care provider. Document Released: 12/29/2008 Document Revised: 08/10/2015 Document Reviewed: 04/08/2014 Elsevier Interactive Patient Education  2017 [employer].

## 2020-05-19 ENCOUNTER — Telehealth: Payer: Self-pay

## 2020-05-19 NOTE — Telephone Encounter (Signed)
Left message to Anita's voicemail to call office to schedule appt with Dr. Raoul Pitch for further evaluation.

## 2020-05-19 NOTE — Telephone Encounter (Signed)
Please assist with scheduling pt an appt. Needs further evaluation.

## 2020-05-19 NOTE — Telephone Encounter (Signed)
Patient daughter, [name] [facility]) requesting some kind of assistance to help lift patient. It takes 2 people to life patient, and its all they can do to get her in and out of chair, bed, etc.  Her right leg has been giving out on her, which makes it extra hard when trying to lift her. Any suggestions, ie..  A lift chair  Please call [name] at [phone]

## 2020-05-25 NOTE — Telephone Encounter (Signed)
error 

## 2020-06-01 DIAGNOSIS — M2550 Pain in unspecified joint: Secondary | ICD-10-CM | POA: Diagnosis not present

## 2020-06-01 DIAGNOSIS — M810 Age-related osteoporosis without current pathological fracture: Secondary | ICD-10-CM | POA: Diagnosis not present

## 2020-06-01 DIAGNOSIS — Z79899 Other long term (current) drug therapy: Secondary | ICD-10-CM | POA: Diagnosis not present

## 2020-06-01 DIAGNOSIS — M316 Other giant cell arteritis: Secondary | ICD-10-CM | POA: Diagnosis not present

## 2020-06-06 ENCOUNTER — Other Ambulatory Visit: Payer: Self-pay | Admitting: Family Medicine

## 2020-06-06 DIAGNOSIS — E781 Pure hyperglyceridemia: Secondary | ICD-10-CM

## 2020-06-08 ENCOUNTER — Other Ambulatory Visit: Payer: Self-pay

## 2020-06-08 ENCOUNTER — Ambulatory Visit (HOSPITAL_BASED_OUTPATIENT_CLINIC_OR_DEPARTMENT_OTHER)
Admission: RE | Admit: 2020-06-08 | Discharge: 2020-06-08 | Disposition: A | Discharge status: Home / Self Care | Payer: Medicare Other | Source: Ambulatory Visit | Attending: Family Medicine | Admitting: Family Medicine

## 2020-06-08 ENCOUNTER — Other Ambulatory Visit: Payer: Self-pay | Admitting: Family Medicine

## 2020-06-08 DIAGNOSIS — M810 Age-related osteoporosis without current pathological fracture: Secondary | ICD-10-CM | POA: Diagnosis not present

## 2020-06-08 DIAGNOSIS — Z780 Asymptomatic menopausal state: Secondary | ICD-10-CM

## 2020-06-13 ENCOUNTER — Other Ambulatory Visit: Payer: Self-pay

## 2020-06-14 ENCOUNTER — Encounter: Payer: Self-pay | Admitting: Family Medicine

## 2020-06-14 ENCOUNTER — Ambulatory Visit (INDEPENDENT_AMBULATORY_CARE_PROVIDER_SITE_OTHER): Payer: Medicare Other | Admitting: Family Medicine

## 2020-06-14 VITALS — BP 159/74 | HR 72 | Temp 98.4°F | Ht 63.0 in | Wt 187.0 lb

## 2020-06-14 DIAGNOSIS — R399 Unspecified symptoms and signs involving the genitourinary system: Secondary | ICD-10-CM

## 2020-06-14 DIAGNOSIS — R300 Dysuria: Secondary | ICD-10-CM | POA: Diagnosis not present

## 2020-06-14 LAB — POCT URINALYSIS DIPSTICK
Bilirubin, UA: NEGATIVE
Blood, UA: NEGATIVE
Glucose, UA: NEGATIVE
Ketones, UA: NEGATIVE
Nitrite, UA: POSITIVE
Protein, UA: POSITIVE — AB
Spec Grav, UA: 1.025 (ref 1.010–1.025)
Urobilinogen, UA: 0.2 E.U./dL
pH, UA: 5.5 (ref 5.0–8.0)

## 2020-06-14 MED ORDER — CEFTRIAXONE SODIUM 1 G IJ SOLR
1.0000 g | Freq: Once | INTRAMUSCULAR | Status: AC
  Administered 2020-06-14: 1 g via INTRAMUSCULAR

## 2020-06-14 MED ORDER — CEFDINIR 300 MG PO CAPS: 300.0000 mg | ORAL_CAPSULE | Freq: Two times a day (BID) | ORAL | 0 refills | Status: DC

## 2020-06-14 NOTE — Patient Instructions (Signed)
Start the antibiotic every 12 hours tonight before bed.  Rocephin shot provided today.

## 2020-06-14 NOTE — Progress Notes (Signed)
This visit occurred during the SARS-CoV-2 public health emergency.  Safety protocols were in place, including screening questions prior to the visit, additional usage of staff PPE, and extensive cleaning of exam room while observing appropriate contact time as indicated for disinfecting solutions.    [name] , [birth year], 87 y.o., female MRN: [id number] Patient Care Team    Relationship Specialty Notifications Start End  Ma Hillock, DO PCP - General Family Medicine  03/16/15   Hermelinda Medicus, MD  Internal Medicine  07/11/15    Comment: Rheumotology  Fay Records, MD Consulting Physician Cardiology  07/11/15   Lafayette Dragon, MD (Inactive)  Gastroenterology  07/11/15   Tanda Rockers, MD Consulting Physician Pulmonary Disease  07/11/15   Jessy Oto, MD Consulting Physician Orthopedic Surgery  07/12/16   Magnus Sinning, MD Consulting Physician Physical Medicine and Rehabilitation  07/12/16   Center, Skin Surgery    07/15/17   [place]  Dentistry  07/15/17     Chief Complaint  Patient presents with  . Dysuria    Pt c/o dysuria, lower abd pain, urine urgency x 3 days;      Subjective: Pt presents for an OV with complaints of dysuria, abd pain, urinary urgency of 3 days duration.  Patient has frequent pansensitive E.Coli UTIs' a few times a year that typically respond to [facility] the best. She is tolerating PO. She was talking "out of her head." Which her daughter has identified as one of the first symptoms of her UTI infections.   Depression screen [facility] 2/9 05/10/2020 01/17/2020 07/28/2018 01/13/2018 07/15/2017  Decreased Interest 0 0 0 - 0  Down, Depressed, Hopeless 0 0 1 1 0  PHQ - 2 Score 0 0 1 1 0  Altered sleeping - - 0 1 0  Tired, decreased energy - - 3 1 0  Change in appetite - - 0 0 0  Feeling bad or failure about yourself  - - 1 0 0  Trouble concentrating - - 1 1 0  Moving slowly or fidgety/restless - - 0 0 0  Suicidal thoughts - - 0 0 0  PHQ-9 Score - - 6 4 0   Difficult doing work/chores - - Not difficult at all Somewhat difficult -    Allergies  Allergen Reactions  . Atorvastatin Other (See Comments)    Severe headache  . Oxycodone Itching    hallucinations  . Statins   . Advair Diskus [Fluticasone-Salmeterol] Other (See Comments)    Mouth sores  . 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    Lives with [name] and [name].   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   Patient takes a daily vitamin   She wears her seat belt. Exercises greater than 3 times a week.   Wears 2 hearing aids. No dentures.   "Sometimes "requires assistive device for walking, either a walker or wheelchair.   There is a smoke detector in her home. There are firearms in her home. She feels safe in her relationships.   Right handed   Past Medical History:  Diagnosis Date  . Abdominal bloating   . Abnormal LFTs (liver function tests)   . Ankle fracture, bimalleolar, closed, right, initial encounter   . Anxiety disorder   . Arthritis   . Asthma   . Depression   . Diverticulosis   . Edema   . Fatty liver   .  Fibromyalgia   . GERD (gastroesophageal reflux disease)   . Giant cell arteritis (HCC)    No biopsy secondary to steroid use  . Hx of adenomatous colonic polyps   . Hyperlipidemia   . Hypertension   . IBS (irritable bowel syndrome)   . Lumbar radiculitis   . Osteoporosis 09/2017   T score -2.6 left femoral neck  . Peptic ulcer disease   . Skin cancer    shoulder, leg, right eye (? squamous by resection)  . Thoracic radiculitis   . Thyroid nodule    "removed"  . Urinary incontinence   . Vertigo    Past Surgical History:  Procedure Laterality Date  . ABDOMINAL HYSTERECTOMY    . BACK SURGERY  2005   lower back  . EYE SURGERY Right 2006  . LIVER BIOPSY     fatty liver  . NASAL SEPTUM SURGERY    . ORIF ANKLE FRACTURE Right 01/11/2019   Procedure: OPEN REDUCTION INTERNAL  FIXATION (ORIF) RIGHT BIMALLEOLAR ANKLE FRACTURE;  Surgeon: Leandrew Koyanagi, MD;  Location: [place];  Service: Orthopedics;  Laterality: Right;  . right knee surgery  2006   arthroscopy  . right leg skin cancer surgery  2014  . right shoulder skin cancer excision  2012  . SKIN CANCER EXCISION    . THYROIDECTOMY     Family History  Problem Relation Age of Onset  . Colon polyps Maternal Aunt   . Diabetes Father   . Heart disease Father   . Heart disease Mother   . Lung disease Mother   . COPD Mother   . Lung cancer Brother   . Cancer Brother        lung  . COPD Brother   . Stroke Brother   . Lung cancer Sister   . COPD Sister   . Kidney disease Other        niece-mat side  . Arthritis Other   . Diabetes Son   . Arthritis Daughter   . Colon cancer Neg Hx    Allergies as of 06/14/2020      Reactions   Atorvastatin Other (See Comments)   Severe headache   Oxycodone Itching   hallucinations   Statins    Advair Diskus [fluticasone-salmeterol] Other (See Comments)   Mouth sores   Sulfonamide Derivatives Rash      Medication List       Accurate as of June 14, 2020  2:59 PM. If you have any questions, ask your nurse or doctor.        STOP taking these medications   magic mouthwash Soln Stopped by: Howard Pouch, DO     TAKE these medications   albuterol 108 (90 Base) MCG/ACT inhaler Commonly known as: ProAir HFA 2 puffs up to every 4 hours if can't catch your breath   alendronate 70 MG tablet Commonly known as: FOSAMAX Take by mouth.   aspirin EC 81 MG tablet Take 1 tablet (81 mg total) by mouth 2 (two) times daily.   budesonide-formoterol 80-4.5 MCG/ACT inhaler Commonly known as: Symbicort USE 2 INHALATIONS TWICE A DAY   calcium-vitamin D 500-200 MG-UNIT tablet Commonly known as: OSCAL WITH D Take 1 tablet by mouth 3 (three) times daily.   cefdinir 300 MG capsule Commonly known as: OMNICEF Take 1 capsule (300 mg total) by mouth 2 (two)  times daily.   Debrox 6.5 % OTIC solution Generic drug: carbamide peroxide Use once a month to keep cerumen form  building up.   diazepam 5 MG tablet Commonly known as: VALIUM 1/2 tab PO in the morning and 1 tab QHS PRN.   FLUoxetine 20 MG capsule Commonly known as: PROZAC TAKE 3 CAPSULES DAILY   meclizine 25 MG tablet Commonly known as: ANTIVERT Take 0.5 tablets (12.5 mg total) by mouth 2 (two) times daily as needed for dizziness.   olmesartan 40 MG tablet Commonly known as: BENICAR Take 1 tablet (40 mg total) by mouth daily.   omega-3 acid ethyl esters 1 g capsule Commonly known as: LOVAZA TAKE 2 CAPSULES TWICE A DAY   omeprazole 40 MG capsule Commonly known as: PRILOSEC Take by mouth.   predniSONE 5 MG tablet Commonly known as: DELTASONE Take 5 mg by mouth daily. What changed: Another medication with the same name was removed. Continue taking this medication, and follow the directions you see here. Changed by: Howard Pouch, DO   Restasis 0.05 % ophthalmic emulsion Generic drug: cycloSPORINE 1 drop 2 (two) times daily.   tocilizumab 4 mg/kg in sodium chloride 0.9 % Inject 280 mg into the vein.   tocilizumab 400 MG/20ML Soln injection Commonly known as: ACTEMRA Infuse 480 mg into the vein once every 28 days.   traMADol 50 MG tablet Commonly known as: ULTRAM TAKE ONE TABLET BY MOUTH EVERY 6 HOURS AS NEEDED FOR MODERATE PAIN   triamcinolone 0.025 % ointment Commonly known as: KENALOG Apply a very thin layer to affected area every 2 days.   Vitamin B-12 5000 MCG Subl Place 0.0001 tablets (0.5 mcg total) under the tongue daily. Place 2500MCG under the tongue daily.   Vitamin D3 20 MCG (800 UNIT) Tabs Take 1 tablet by mouth daily.   zinc sulfate 220 (50 Zn) MG capsule Take 1 capsule (220 mg total) by mouth daily.       All past medical history, surgical history, allergies, family history, immunizations andmedications were updated in the EMR today and  reviewed under the history and medication portions of their EMR.     ROS: Negative, with the exception of above mentioned in HPI   Objective:  BP (!) 159/74   Pulse 72   Temp 98.4 F (36.9 C) (Oral)   Ht 5\' 3"  (1.6 m)   Wt 187 lb (84.8 kg)   SpO2 95%   BMI 33.13 kg/m  Body mass index is 33.13 kg/m. Gen: Afebrile. No acute distress. Nontoxic in appearance, well developed, well nourished.  HENT: AT. [place].  Eyes:Pupils Equal Round Reactive to light, Extraocular movements intact,  Conjunctiva without redness, discharge or icterus. CV: RRR  Chest: CTAB Abd: Soft. obese. NTND. BS present. no Masses palpated. No rebound or guarding.  Neuro: PERLA. EOMi. Alert. Oriented x3  Psych: Normal affect, dress and demeanor. Normal speech. Normal thought content and judgment.  No exam data present No results found. Results for orders placed or performed in visit on 06/14/20 (from the past 24 hour(s))  POCT urinalysis dipstick     Status: Abnormal   Collection Time: 06/14/20  1:54 PM  Result Value Ref Range   Color, UA yellow    Clarity, UA cloudy    Glucose, UA Negative Negative   Bilirubin, UA negative    Ketones, UA negative    Spec Grav, UA 1.025 1.010 - 1.025   Blood, UA negative    pH, UA 5.5 5.0 - 8.0   Protein, UA Positive (A) Negative   Urobilinogen, UA 0.2 0.2 or 1.0 E.U./dL   Nitrite, UA positive  Leukocytes, UA Trace (A) Negative   Appearance     Odor      Assessment/Plan: [name] is a 87 y.o. female present for OV for  UTI symptoms/dysuria - POCT urinalysis dipstick> appears infectious.  - start omincel bid tonight before bed.  - rocpehin 1g provided today . - Urinalysis w microscopic + reflex cultur - f/u prn   Reviewed expectations re: course of current medical issues.  Discussed self-management of symptoms.  Outlined signs and symptoms indicating need for more acute intervention.  Patient verbalized understanding and all questions were  answered.  Patient received an After-Visit Summary.    Orders Placed This Encounter  Procedures  . Urinalysis w microscopic + reflex cultur  . POCT urinalysis dipstick   Meds ordered this encounter  Medications  . cefdinir (OMNICEF) 300 MG capsule    Sig: Take 1 capsule (300 mg total) by mouth 2 (two) times daily.    Dispense:  20 capsule    Refill:  0  . cefTRIAXone (ROCEPHIN) injection 1 g   Referral Orders  No referral(s) requested today     Note is dictated utilizing voice recognition software. Although note has been proof read prior to signing, occasional typographical errors still can be missed. If any questions arise, please do not hesitate to call for verification.   electronically signed by:  Howard Pouch, DO  [place]

## 2020-06-17 LAB — URINALYSIS W MICROSCOPIC + REFLEX CULTURE
Bilirubin Urine: NEGATIVE
Glucose, UA: NEGATIVE
Hgb urine dipstick: NEGATIVE
Hyaline Cast: NONE SEEN /LPF
Ketones, ur: NEGATIVE
Nitrites, Initial: POSITIVE — AB
RBC / HPF: NONE SEEN /HPF (ref 0–2)
Specific Gravity, Urine: 1.019 (ref 1.001–1.03)
pH: <=5 (ref 5.0–8.0)

## 2020-06-17 LAB — URINE CULTURE
MICRO NUMBER:: 11715689
SPECIMEN QUALITY:: ADEQUATE

## 2020-06-17 LAB — CULTURE INDICATED

## 2020-06-20 ENCOUNTER — Other Ambulatory Visit: Payer: Self-pay | Admitting: Family Medicine

## 2020-06-20 DIAGNOSIS — I10 Essential (primary) hypertension: Secondary | ICD-10-CM

## 2020-06-27 ENCOUNTER — Other Ambulatory Visit: Payer: Self-pay | Admitting: Family Medicine

## 2020-07-04 DIAGNOSIS — M316 Other giant cell arteritis: Secondary | ICD-10-CM | POA: Diagnosis not present

## 2020-07-31 DIAGNOSIS — N390 Urinary tract infection, site not specified: Secondary | ICD-10-CM | POA: Diagnosis not present

## 2020-08-17 ENCOUNTER — Encounter: Payer: Self-pay | Admitting: Family Medicine

## 2020-08-17 ENCOUNTER — Other Ambulatory Visit: Payer: Self-pay

## 2020-08-17 ENCOUNTER — Ambulatory Visit (INDEPENDENT_AMBULATORY_CARE_PROVIDER_SITE_OTHER): Payer: Medicare Other | Admitting: Family Medicine

## 2020-08-17 VITALS — BP 112/65 | HR 77 | Temp 98.2°F | Ht 63.0 in | Wt 193.0 lb

## 2020-08-17 DIAGNOSIS — H6121 Impacted cerumen, right ear: Secondary | ICD-10-CM

## 2020-08-17 DIAGNOSIS — I10 Essential (primary) hypertension: Secondary | ICD-10-CM

## 2020-08-17 DIAGNOSIS — H9203 Otalgia, bilateral: Secondary | ICD-10-CM

## 2020-08-17 MED ORDER — OLMESARTAN MEDOXOMIL 40 MG PO TABS: 40.0000 mg | ORAL_TABLET | Freq: Every day | ORAL | 0 refills | Status: DC

## 2020-08-17 MED ORDER — CORTISPORIN-TC 3.3-3-10-0.5 MG/ML OT SUSP: 3.0000 [drp] | Freq: Four times a day (QID) | OTIC | 0 refills | Status: DC

## 2020-08-17 NOTE — Progress Notes (Signed)
This visit occurred during the SARS-CoV-2 public health emergency.  Safety protocols were in place, including screening questions prior to the visit, additional usage of staff PPE, and extensive cleaning of exam room while observing appropriate contact time as indicated for disinfecting solutions.    [name] , Dec 31, 1929, 87 y.o., female MRN: [id number] Patient Care Team    Relationship Specialty Notifications Start End  Ma Hillock, DO PCP - General Family Medicine  03/16/15   Hermelinda Medicus, MD  Internal Medicine  07/11/15    Comment: Rheumotology  Fay Records, MD Consulting Physician Cardiology  07/11/15   Lafayette Dragon, MD (Inactive)  Gastroenterology  07/11/15   Tanda Rockers, MD Consulting Physician Pulmonary Disease  07/11/15   Jessy Oto, MD Consulting Physician Orthopedic Surgery  07/12/16   Magnus Sinning, MD Consulting Physician Physical Medicine and Rehabilitation  07/12/16   [facility]    07/15/17   [place]  Dent[facility]  07/15/17     Chief Complaint  Patient presents with  . Ear Pain    Pt c/o b/l ear pain R>L, ear pain x 6 days; pt report swelling in ears, and bleeding.      Subjective: Pt presents for an OV with complaints of bilateral ear pain of 6 days duration.  Associated symptoms include discomfort along right side of neck.  She also endorses having noted some blood from her right ear.  She denies fevers, chills or ringing in her ears.  She is hard of hearing and wears hearing aids typically.  Her family has been working with her to flush her ears out with peroxide solution since onset of symptoms and she does admit the left ear is greatly improved and no longer is bothering her, but the right ear is still uncomfortable.  Depression screen [facility] 2/9 08/17/2020 05/10/2020 01/17/2020 07/28/2018 01/13/2018  Decreased Interest 0 0 0 0 -  Down, Depressed, Hopeless 0 0 0 1 1  PHQ - 2 Score 0 0 0 1 1  Altered sleeping - - - 0 1  Tired, decreased  energy - - - 3 1  Change in appetite - - - 0 0  Feeling bad or failure about yourself  - - - 1 0  Trouble concentrating - - - 1 1  Moving slowly or fidgety/restless - - - 0 0  Suicidal thoughts - - - 0 0  PHQ-9 Score - - - 6 4  Difficult doing work/chores - - - Not difficult at all Somewhat difficult    Allergies  Allergen Reactions  . Atorvastatin Other (See Comments)    Severe headache  . Oxycodone Itching    hallucinations  . Statins   . Advair Diskus [Fluticasone-Salmeterol] Other (See Comments)    Mouth sores  . 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    Lives with [name] and [name].   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   Patient takes a daily vitamin   She wears her seat belt. Exercises greater than 3 times a week.   Wears 2 hearing aids. No dentures.   "Sometimes "requires assistive device for walking, either a walker or wheelchair.   There is a smoke detector in her home. There are firearms in her home. She feels safe in her relationships.   Right handed   Past Medical History:  Diagnosis Date  . Abdominal bloating   . Abnormal LFTs (  liver function tests)   . Ankle fracture, bimalleolar, closed, right, initial encounter   . Anxiety disorder   . Arthritis   . Asthma   . Depression   . Diverticulosis   . Edema   . Fatty liver   . Fibromyalgia   . GERD (gastroesophageal reflux disease)   . Giant cell arteritis (HCC)    No biopsy secondary to steroid use  . Hx of adenomatous colonic polyps   . Hyperlipidemia   . Hypertension   . IBS (irritable bowel syndrome)   . Lumbar radiculitis   . Osteoporosis 09/2017   T score -2.6 left femoral neck  . Peptic ulcer disease   . Skin cancer    shoulder, leg, right eye (? squamous by resection)  . Thoracic radiculitis   . Thyroid nodule    "removed"  . Urinary incontinence   . Vertigo    Past Surgical History:  Procedure Laterality Date   . ABDOMINAL HYSTERECTOMY    . BACK SURGERY  2005   lower back  . EYE SURGERY Right 2006  . LIVER BIOPSY     fatty liver  . NASAL SEPTUM SURGERY    . ORIF ANKLE FRACTURE Right 01/11/2019   Procedure: OPEN REDUCTION INTERNAL FIXATION (ORIF) RIGHT BIMALLEOLAR ANKLE FRACTURE;  Surgeon: Leandrew Koyanagi, MD;  Location: [place];  Service: Orthopedics;  Laterality: Right;  . right knee surgery  2006   arthroscopy  . right leg skin cancer surgery  2014  . right shoulder skin cancer excision  2012  . SKIN CANCER EXCISION    . THYROIDECTOMY     Family History  Problem Relation Age of Onset  . Colon polyps Maternal Aunt   . Diabetes Father   . Heart disease Father   . Heart disease Mother   . Lung disease Mother   . COPD Mother   . Lung cancer Brother   . Cancer Brother        lung  . COPD Brother   . Stroke Brother   . Lung cancer Sister   . COPD Sister   . Kidney disease Other        niece-mat side  . Arthritis Other   . Diabetes Son   . Arthritis Daughter   . Colon cancer Neg Hx    Allergies as of 08/17/2020      Reactions   Atorvastatin Other (See Comments)   Severe headache   Oxycodone Itching   hallucinations   Statins    Advair Diskus [fluticasone-salmeterol] Other (See Comments)   Mouth sores   Sulfonamide Derivatives Rash      Medication List       Accurate as of August 17, 2020  4:12 PM. If you have any questions, ask your nurse or doctor.        STOP taking these medications   cefdinir 300 MG capsule Commonly known as: OMNICEF Stopped by: Howard Pouch, DO   meclizine 25 MG tablet Commonly known as: ANTIVERT Stopped by: Howard Pouch, DO   omeprazole 40 MG capsule Commonly known as: PRILOSEC Stopped by: Howard Pouch, DO   zinc sulfate 220 (50 Zn) MG capsule Stopped by: Howard Pouch, DO     TAKE these medications   albuterol 108 (90 Base) MCG/ACT inhaler Commonly known as: ProAir HFA 2 puffs up to every 4 hours if can't catch your  breath   alendronate 70 MG tablet Commonly known as: FOSAMAX Take by mouth.   aspirin  EC 81 MG tablet Take 1 tablet (81 mg total) by mouth 2 (two) times daily.   budesonide-formoterol 80-4.5 MCG/ACT inhaler Commonly known as: Symbicort USE 2 INHALATIONS TWICE A DAY   calcium-vitamin D 500-200 MG-UNIT tablet Commonly known as: OSCAL WITH D Take 1 tablet by mouth 3 (three) times daily.   cephALEXin 500 MG capsule Commonly known as: KEFLEX Take 500 mg by mouth 2 (two) times daily.   Cortisporin-TC 3.05-18-08-0.5 MG/ML OTIC suspension Generic drug: neomycin-colistin-hydrocortisone-thonzonium Place 3 drops into the right ear 4 (four) times daily. Started by: Howard Pouch, DO   Debrox 6.5 % OTIC solution Generic drug: carbamide peroxide Use once a month to keep cerumen form building up.   diazepam 5 MG tablet Commonly known as: VALIUM 1/2 tab PO in the morning and 1 tab QHS PRN.   FLUoxetine 20 MG capsule Commonly known as: PROZAC TAKE 3 CAPSULES DAILY   olmesartan 40 MG tablet Commonly known as: BENICAR Take 1 tablet (40 mg total) by mouth daily.   omega-3 acid ethyl esters 1 g capsule Commonly known as: LOVAZA TAKE 2 CAPSULES TWICE A DAY   phenazopyridine 100 MG tablet Commonly known as: PYRIDIUM Take 100 mg by mouth.   predniSONE 5 MG tablet Commonly known as: DELTASONE Take 5 mg by mouth daily.   Restasis 0.05 % ophthalmic emulsion Generic drug: cycloSPORINE 1 drop 2 (two) times daily.   tocilizumab 4 mg/kg in sodium chloride 0.9 % Inject 280 mg into the vein.   tocilizumab 400 MG/20ML Soln injection Commonly known as: ACTEMRA Infuse 480 mg into the vein once every 28 days.   traMADol 50 MG tablet Commonly known as: ULTRAM TAKE ONE TABLET BY MOUTH EVERY 6 HOURS AS NEEDED FOR MODERATE PAIN   triamcinolone 0.025 % ointment Commonly known as: KENALOG Apply a very thin layer to affected area every 2 days.   Vitamin B-12 5000 MCG Subl Place 0.0001  tablets (0.5 mcg total) under the tongue daily. Place 2500MCG under the tongue daily.   Vitamin D3 20 MCG (800 UNIT) Tabs Take 1 tablet by mouth daily.       All past medical history, surgical history, allergies, family history, immunizations andmedications were updated in the EMR today and reviewed under the history and medication portions of their EMR.     ROS: Negative, with the exception of above mentioned in HPI   Objective:  BP 112/65   Pulse 77   Temp 98.2 F (36.8 C) (Oral)   Ht 5\' 3"  (1.6 m)   Wt 193 lb (87.5 kg)   SpO2 93%   BMI 34.19 kg/m  Body mass index is 34.19 kg/m. Gen: Afebrile. No acute distress. Nontoxic in appearance, well developed, well nourished.  HENT: AT. [place]. Left TM intact, no erythema or effusion. Left EAC has small abrasion distal end- well healing. Right TM unable to be visualized secondary to cerumen in canal. Eyes:Pupils Equal Round Reactive to light, Extraocular movements intact,  Conjunctiva without redness, discharge or icterus. Neck/lymp/endocrine: Supple, no lymphadenopathy Neuro:  Alert. Oriented x3 Psych: Normal affect, dress and demeanor. Normal speech. Normal thought content and judgment.  No exam data present No results found. No results found for this or any previous visit (from the past 24 hour(s)).  Assessment/Plan: Cyriah Childrey is a 87 y.o. female present for OV for  Essential hypertension, benign Courtesy refill provided Pt will make virtual appt within 1-3 weeks for [facility] - olmesartan (BENICAR) 40 MG tablet; Take 1 tablet (40 mg  total) by mouth daily.  Dispense: 30 tablet; Refill: 0  Cerumen debris on tympanic membrane, right/Otalgia of both ears/otitis externa Cortisporin otic prescribed for right ear. Procedure: Cerumen removal Patient was verbally consented to procedure. Water-peroxide solution was applied and gentle ear lavage performed on right ear(s).  There were no complications.  Tympanic membrane was visualized  after disimpaction.  Tympanic membrane(s) intact.  Auditory canal(s) with mild abrasion present from hearing aid- well healing- no bleeding present .  Mild exudative material consistent with otitis externa present.  Patient tolerated procedure well.  Follow-up as needed on acute condition   Reviewed expectations re: course of current medical issues.  Discussed self-management of symptoms.  Outlined signs and symptoms indicating need for more acute intervention.  Patient verbalized understanding and all questions were answered.  Patient received an After-Visit Summary.    No orders of the defined types were placed in this encounter.  Meds ordered this encounter  Medications  . olmesartan (BENICAR) 40 MG tablet    Sig: Take 1 tablet (40 mg total) by mouth daily.    Dispense:  30 tablet    Refill:  0    MUST HAVE OV FOR FURTHER REFILLS  . neomycin-colistin-hydrocortisone-thonzonium (CORTISPORIN-TC) 3.05-18-08-0.5 MG/ML OTIC suspension    Sig: Place 3 drops into the right ear 4 (four) times daily.    Dispense:  10 mL    Refill:  0   Referral Orders  No referral(s) requested today     Note is dictated utilizing voice recognition software. Although note has been proof read prior to signing, occasional typographical errors still can be missed. If any questions arise, please do not hesitate to call for verification.   electronically signed by:  Howard Pouch, DO  [place]

## 2020-08-17 NOTE — Patient Instructions (Signed)
Great to see you today.     Please make appt for your chronic conditions within next 3 weeks- this can be virtual.

## 2020-08-30 ENCOUNTER — Other Ambulatory Visit: Payer: Self-pay

## 2020-08-30 ENCOUNTER — Telehealth: Payer: Self-pay

## 2020-08-30 ENCOUNTER — Ambulatory Visit (INDEPENDENT_AMBULATORY_CARE_PROVIDER_SITE_OTHER): Payer: Medicare Other | Admitting: Family Medicine

## 2020-08-30 ENCOUNTER — Encounter: Payer: Self-pay | Admitting: Family Medicine

## 2020-08-30 VITALS — BP 158/68 | HR 73 | Temp 98.5°F | Resp 16 | Ht 63.0 in | Wt 194.0 lb

## 2020-08-30 DIAGNOSIS — N390 Urinary tract infection, site not specified: Secondary | ICD-10-CM

## 2020-08-30 DIAGNOSIS — N3001 Acute cystitis with hematuria: Secondary | ICD-10-CM | POA: Diagnosis not present

## 2020-08-30 DIAGNOSIS — D849 Immunodeficiency, unspecified: Secondary | ICD-10-CM

## 2020-08-30 DIAGNOSIS — R300 Dysuria: Secondary | ICD-10-CM | POA: Diagnosis not present

## 2020-08-30 LAB — POCT URINALYSIS DIPSTICK
Bilirubin, UA: NEGATIVE
Glucose, UA: NEGATIVE
Ketones, UA: NEGATIVE
Nitrite, UA: POSITIVE
Protein, UA: POSITIVE — AB
Spec Grav, UA: 1.025 (ref 1.010–1.025)
Urobilinogen, UA: 1 E.U./dL
pH, UA: 6 (ref 5.0–8.0)

## 2020-08-30 MED ORDER — CEFDINIR 300 MG PO CAPS: 300.0000 mg | ORAL_CAPSULE | Freq: Two times a day (BID) | ORAL | 0 refills | Status: AC

## 2020-08-30 MED ORDER — CEFTRIAXONE SODIUM 1 G IJ SOLR
1.0000 g | Freq: Once | INTRAMUSCULAR | Status: AC
  Administered 2020-08-30: 1 g via INTRAMUSCULAR

## 2020-08-30 NOTE — Telephone Encounter (Addendum)
Scheduled pt in [facility] 4/4:15 appt

## 2020-08-30 NOTE — Patient Instructions (Signed)
Start your antibiotic pills (cefdinir) tomorrow.

## 2020-08-30 NOTE — Progress Notes (Signed)
OFFICE VISIT  08/30/2020  CC:  Chief Complaint  Patient presents with   Burning with urination    Also having lower abd pressure    HPI:    Patient is a 87 y.o. female who presents faccompanied by her daughter for urinary complaints. Onset a couple days ago burning with urination, progressively worsening.  Inc urgency.  Having some diffuse suprapubic pain.  No fever.  No n/v/d.  No flank pain, no gross blood in urine. Hx of recurrent UTI.  She is on low dose daily prednisone and tocilizumab infusions for PMR/giant cell arteritis--- has not had this infusion since April, though.  Past Medical History:  Diagnosis Date   Abdominal bloating    Abnormal LFTs (liver function tests)    Ankle fracture, bimalleolar, closed, right, initial encounter    Anxiety disorder    Arthritis    Asthma    Depression    Diverticulosis    Edema    Fatty liver    Fibromyalgia    GERD (gastroesophageal reflux disease)    Giant cell arteritis (HCC)    No biopsy secondary to steroid use   Hx of adenomatous colonic polyps    Hyperlipidemia    Hypertension    IBS (irritable bowel syndrome)    Lumbar radiculitis    Osteoporosis 09/2017   T score -2.6 left femoral neck   Peptic ulcer disease    Skin cancer    shoulder, leg, right eye (? squamous by resection)   Thoracic radiculitis    Thyroid nodule    "removed"   Urinary incontinence    Vertigo     Past Surgical History:  Procedure Laterality Date   ABDOMINAL HYSTERECTOMY     BACK SURGERY  2005   lower back   EYE SURGERY Right 2006   LIVER BIOPSY     fatty liver   NASAL SEPTUM SURGERY     ORIF ANKLE FRACTURE Right 01/11/2019   Procedure: OPEN REDUCTION INTERNAL FIXATION (ORIF) RIGHT BIMALLEOLAR ANKLE FRACTURE;  Surgeon: Leandrew Koyanagi, MD;  Location: [facility];  Service: Orthopedics;  Laterality: Right;   right knee surgery  2006   arthroscopy   right leg skin cancer surgery  2014   right shoulder skin cancer  excision  2012   SKIN CANCER EXCISION     THYROIDECTOMY      Outpatient Medications Prior to Visit  Medication Sig Dispense Refill   albuterol (PROAIR HFA) 108 (90 Base) MCG/ACT inhaler 2 puffs up to every 4 hours if can't catch your breath 8 g 5   alendronate (FOSAMAX) 70 MG tablet Take by mouth.     aspirin EC 81 MG tablet Take 1 tablet (81 mg total) by mouth 2 (two) times daily. 84 tablet 0   budesonide-formoterol (SYMBICORT) 80-4.5 MCG/ACT inhaler USE 2 INHALATIONS TWICE A DAY 30.6 g 12   calcium-vitamin D (OSCAL WITH D) 500-200 MG-UNIT tablet Take 1 tablet by mouth 3 (three) times daily. 90 tablet 6   carbamide peroxide (DEBROX) 6.5 % OTIC solution Use once a month to keep cerumen form building up. 15 mL 0   Cholecalciferol (VITAMIN D3) 20 MCG (800 UNIT) TABS Take 1 tablet by mouth daily. 90 tablet 3   Cyanocobalamin (VITAMIN B-12) 5000 MCG SUBL Place 0.0001 tablets (0.5 mcg total) under the tongue daily. Place 2500MCG under the tongue daily. 1 tablet 11   diazepam (VALIUM) 5 MG tablet 1/2 tab PO in the morning and 1 tab QHS  PRN. 45 tablet 5   FLUoxetine (PROZAC) 20 MG capsule TAKE 3 CAPSULES DAILY 90 capsule 0   neomycin-colistin-hydrocortisone-thonzonium (CORTISPORIN-TC) 3.05-18-08-0.5 MG/ML OTIC suspension Place 3 drops into the right ear 4 (four) times daily. 10 mL 0   olmesartan (BENICAR) 40 MG tablet Take 1 tablet (40 mg total) by mouth daily. 30 tablet 0   omega-3 acid ethyl esters (LOVAZA) 1 g capsule TAKE 2 CAPSULES TWICE A DAY 240 capsule 5   predniSONE (DELTASONE) 5 MG tablet Take 5 mg by mouth daily.      RESTASIS 0.05 % ophthalmic emulsion 1 drop 2 (two) times daily.     tocilizumab 4 mg/kg in sodium chloride 0.9 % Inject 280 mg into the vein.     traMADol (ULTRAM) 50 MG tablet TAKE ONE TABLET BY MOUTH EVERY 6 HOURS AS NEEDED FOR MODERATE PAIN 40 tablet 0   triamcinolone (KENALOG) 0.025 % ointment Apply a very thin layer to affected area every 2 days. 30 g 0   tocilizumab  (ACTEMRA) 400 MG/20ML SOLN injection Infuse 480 mg into the vein once every 28 days. (Patient not taking: Reported on 08/30/2020)     cephALEXin (KEFLEX) 500 MG capsule Take 500 mg by mouth 2 (two) times daily. (Patient not taking: Reported on 08/30/2020)     phenazopyridine (PYRIDIUM) 100 MG tablet Take 100 mg by mouth. (Patient not taking: Reported on 08/30/2020)     No facility-administered medications prior to visit.    Allergies  Allergen Reactions   Atorvastatin Other (See Comments)    Severe headache   Oxycodone Itching    hallucinations   Statins    Advair Diskus [Fluticasone-Salmeterol] Other (See Comments)    Mouth sores   Sulfonamide Derivatives Rash    ROS As per HPI  PE: Vitals with BMI 08/30/2020 08/17/2020 06/14/2020  Height 5\' 3"  5\' 3"  5\' 3"   Weight 194 lbs 193 lbs 187 lbs  BMI 34.37 71.6 96.78  Systolic 938 101 751  Diastolic 68 65 74  Pulse 73 77 72   Gen: Alert, well appearing.  Patient is oriented to person, place, time, and situation. AFFECT: pleasant, lucid thought and speech. No further exam today.  LABS:    Chemistry      Component Value Date/Time   NA 137 02/21/2020 1601   NA 140 07/09/2018 0000   K 4.4 02/21/2020 1601   CL 102 02/21/2020 1601   CO2 29 02/21/2020 1601   BUN 20 02/21/2020 1601   BUN 15 05/21/2019 0000   CREATININE 0.93 02/21/2020 1601      Component Value Date/Time   CALCIUM 9.4 02/21/2020 1601   ALKPHOS 61 02/21/2020 1601   AST 34 02/21/2020 1601   ALT 30 02/21/2020 1601   BILITOT 0.4 02/21/2020 1601     POC CC dipstick UA today: trace blood, nitrite +, LEU 1+, SG 1.025  IMPRESSION AND PLAN:  Acute UTI, pt is relatively immunosuppressed (prednisone and tocilizumab) and has hx of recurrent UTIs. Past treatment with rocephin IM in office followed by cefdinir was quite helpful for patient and she is highly in favor of this again today so I did this. Rocephin 1g IM here today. Cefdinir 300 bid x 3d starting tomorrow.  An  After Visit Summary was printed and given to the patient.  FOLLOW UP: No follow-ups on file.  Signed:  Crissie Sickles, MD           08/30/2020

## 2020-08-30 NOTE — Telephone Encounter (Signed)
Per Dr. Raoul Pitch prior conversations. Pt do not need her permission to see other providers if she does not have openings. Please assist pt to schedule.   Routing to provider as well as a [employer]

## 2020-08-30 NOTE — Telephone Encounter (Signed)
Daughter, [name] [facility]) calling about patient with UTI symptoms, burning and painful. Patient has an appt on Friday with Dr. Raoul Pitch, but daughter states she cannot wait until Friday. Dr. Raoul Pitch does not have any openings, however, Dr. Anitra Lauth has an acute opening today at [facility]. Sending as high priority because of needing an appt asap  Please advise [phone]

## 2020-08-30 NOTE — Addendum Note (Signed)
Addended by: Deveron Furlong D on: 08/30/2020 05:03 PM   Modules accepted: Orders

## 2020-09-01 ENCOUNTER — Other Ambulatory Visit: Payer: Self-pay

## 2020-09-01 ENCOUNTER — Telehealth (INDEPENDENT_AMBULATORY_CARE_PROVIDER_SITE_OTHER): Payer: Medicare Other | Admitting: Family Medicine

## 2020-09-01 ENCOUNTER — Encounter: Payer: Self-pay | Admitting: Family Medicine

## 2020-09-01 DIAGNOSIS — E782 Mixed hyperlipidemia: Secondary | ICD-10-CM | POA: Diagnosis not present

## 2020-09-01 DIAGNOSIS — J4540 Moderate persistent asthma, uncomplicated: Secondary | ICD-10-CM

## 2020-09-01 DIAGNOSIS — M353 Polymyalgia rheumatica: Secondary | ICD-10-CM

## 2020-09-01 DIAGNOSIS — M316 Other giant cell arteritis: Secondary | ICD-10-CM

## 2020-09-01 DIAGNOSIS — D849 Immunodeficiency, unspecified: Secondary | ICD-10-CM | POA: Insufficient documentation

## 2020-09-01 DIAGNOSIS — F418 Other specified anxiety disorders: Secondary | ICD-10-CM | POA: Diagnosis not present

## 2020-09-01 DIAGNOSIS — R110 Nausea: Secondary | ICD-10-CM | POA: Diagnosis not present

## 2020-09-01 DIAGNOSIS — I10 Essential (primary) hypertension: Secondary | ICD-10-CM

## 2020-09-01 DIAGNOSIS — N3000 Acute cystitis without hematuria: Secondary | ICD-10-CM

## 2020-09-01 LAB — URINE CULTURE
MICRO NUMBER:: 12014032
SPECIMEN QUALITY:: ADEQUATE

## 2020-09-01 MED ORDER — DIAZEPAM 5 MG PO TABS: ORAL_TABLET | ORAL | 1 refills | Status: DC

## 2020-09-01 MED ORDER — FLUOXETINE HCL 20 MG PO CAPS: 60.0000 mg | ORAL_CAPSULE | Freq: Every day | ORAL | 1 refills | Status: DC

## 2020-09-01 MED ORDER — ONDANSETRON 8 MG PO TBDP: 8.0000 mg | ORAL_TABLET | Freq: Three times a day (TID) | ORAL | 0 refills | Status: DC | PRN

## 2020-09-01 MED ORDER — OLMESARTAN MEDOXOMIL 40 MG PO TABS: 40.0000 mg | ORAL_TABLET | Freq: Every day | ORAL | 1 refills | Status: DC

## 2020-09-01 NOTE — Patient Instructions (Signed)

## 2020-09-01 NOTE — Progress Notes (Signed)
VIRTUAL VISIT VIA VIDEO  I connected with [name] on 09/01/20 at 11:00 AM EDT by a video enabled telemedicine application and verified that I am speaking with the correct person using two identifiers. Location patient: Home Location provider: [facility] ([place]), Office Persons participating in the virtual visit: Patient, Dr. Raoul Pitch and Cyndra Numbers, CMA  I discussed the limitations of evaluation and management by telemedicine and the availability of in person appointments. The patient expressed understanding and agreed to proceed.    Patient Care Team    Relationship Specialty Notifications Start End  Ma Hillock, DO PCP - General Family Medicine  03/16/15   Hermelinda Medicus, MD  Internal Medicine  07/11/15    Comment: Rheumotology  Fay Records, MD Consulting Physician Cardiology  07/11/15   Lafayette Dragon, MD (Inactive)  Gastroenterology  07/11/15   Tanda Rockers, MD Consulting Physician Pulmonary Disease  07/11/15   Jessy Oto, MD Consulting Physician Orthopedic Surgery  07/12/16   Magnus Sinning, MD Consulting Physician Physical Medicine and Rehabilitation  07/12/16   Center, Skin Surgery    07/15/17   [place]  Dentistry  07/15/17      SUBJECTIVE Chief Complaint  Patient presents with   Hypertension    HPI: [name] is a 87 y.o. female present for [facility] Hypertension/iron deficiency/hyperlipidemia: Pt Pt reports compliance with Benicar 40 mg QD. Blood pressures ranges at home are normal-was normal here her last visit. Patient denies chest pain, shortness of breath, dizziness or lower extremity edema.  Pt taking daily baby ASA. Pt is intolerant of statin. Diet: Low-sodium Exercise: Not routinely RF: Hypertension, hyperlipidemia, obesity, Fhx HD   Depression with anxiety: She reports she is feeling well on Prozac 60 mg Qg- we did try to decrease - but anxiety increased.  She continues to take the Valium 2.5/5 mg daily. Indication for chronic benzo: Valium-  anxiety and lower back spasms Medication and dose: Valium 2.5 mg/5 mg # pills per month: #45 Last UDS date: next appt.  Pain contract signed (Y/N): Y Date narcotic database last reviewed (include red flags):09/01/20   Asthma: Patient reports compliance with Symbicort 2 puffs twice a day.  asthma has been well controlled.  UTI/immunocompromised: She was seen yesterday by my partner.  Treated with 1 g of Rocephin and Omnicef twice daily.  She reports continued dysuria today and not feeling well.  Has more nausea today.  She is still tolerating p.o.   ROS: See pertinent positives and negatives per HPI.  Patient Active Problem List   Diagnosis Date Noted   Acute vulvitis 07/31/2018   Mixed stress and urge urinary incontinence 09/04/2017   Elevated hemoglobin (HCC) 04/04/2017   Chronic back pain 01/28/2017   Morbid obesity ([place]) 01/28/2017   Peptic ulcer disease    Lumbar radiculitis    Frequent falls 08/02/2016   Hypertensive retinopathy of both eyes 09/07/2015   Medicare annual wellness visit, subsequent 07/11/2015   Long-term use of high-risk medication 07/11/2015   Osteopenia 04/11/2015   Depression with anxiety 04/11/2015   Giant cell arteritis ([place]) 03/28/2015   Polymyalgia rheumatica ([place]) 03/16/2015   Chronic narcotic use 03/16/2015   Anemia, iron deficiency 07/14/2013   Asthma, moderate persistent 10/15/2012   Hyperlipidemia 12/27/2008   HYPERTENSION, BENIGN 12/27/2008    Social History   Tobacco Use   Smoking status: Never   Smokeless tobacco: Never  Substance Use Topics   Alcohol use: No    Current Outpatient Medications:  albuterol (PROAIR HFA) 108 (90 Base) MCG/ACT inhaler, 2 puffs up to every 4 hours if can't catch your breath, Disp: 8 g, Rfl: 5   alendronate (FOSAMAX) 70 MG tablet, Take by mouth., Disp: , Rfl:    aspirin EC 81 MG tablet, Take 1 tablet (81 mg total) by mouth 2 (two) times daily., Disp: 84 tablet, Rfl: 0   budesonide-formoterol (SYMBICORT)  80-4.5 MCG/ACT inhaler, USE 2 INHALATIONS TWICE A DAY, Disp: 30.6 g, Rfl: 12   calcium-vitamin D (OSCAL WITH D) 500-200 MG-UNIT tablet, Take 1 tablet by mouth 3 (three) times daily., Disp: 90 tablet, Rfl: 6   carbamide peroxide (DEBROX) 6.5 % OTIC solution, Use once a month to keep cerumen form building up., Disp: 15 mL, Rfl: 0   cefdinir (OMNICEF) 300 MG capsule, Take 1 capsule (300 mg total) by mouth 2 (two) times daily for 3 days., Disp: 6 capsule, Rfl: 0   Cholecalciferol (VITAMIN D3) 20 MCG (800 UNIT) TABS, Take 1 tablet by mouth daily., Disp: 90 tablet, Rfl: 3   Cyanocobalamin (VITAMIN B-12) 5000 MCG SUBL, Place 0.0001 tablets (0.5 mcg total) under the tongue daily. Place 2500MCG under the tongue daily., Disp: 1 tablet, Rfl: 11   neomycin-colistin-hydrocortisone-thonzonium (CORTISPORIN-TC) 3.05-18-08-0.5 MG/ML OTIC suspension, Place 3 drops into the right ear 4 (four) times daily., Disp: 10 mL, Rfl: 0   omega-3 acid ethyl esters (LOVAZA) 1 g capsule, TAKE 2 CAPSULES TWICE A DAY, Disp: 240 capsule, Rfl: 5   ondansetron (ZOFRAN ODT) 8 MG disintegrating tablet, Take 1 tablet (8 mg total) by mouth every 8 (eight) hours as needed for nausea or vomiting., Disp: 30 tablet, Rfl: 0   predniSONE (DELTASONE) 5 MG tablet, Take 5 mg by mouth daily. , Disp: , Rfl:    RESTASIS 0.05 % ophthalmic emulsion, 1 drop 2 (two) times daily., Disp: , Rfl:    tocilizumab (ACTEMRA) 400 MG/20ML SOLN injection, , Disp: , Rfl:    tocilizumab 4 mg/kg in sodium chloride 0.9 %, Inject 280 mg into the vein., Disp: , Rfl:    traMADol (ULTRAM) 50 MG tablet, TAKE ONE TABLET BY MOUTH EVERY 6 HOURS AS NEEDED FOR MODERATE PAIN, Disp: 40 tablet, Rfl: 0   triamcinolone (KENALOG) 0.025 % ointment, Apply a very thin layer to affected area every 2 days., Disp: 30 g, Rfl: 0   diazepam (VALIUM) 5 MG tablet, 1/2 tab PO in the morning and 1 tab QHS PRN., Disp: 135 tablet, Rfl: 1   FLUoxetine (PROZAC) 20 MG capsule, Take 3 capsules (60 mg total)  by mouth daily., Disp: 270 capsule, Rfl: 1   olmesartan (BENICAR) 40 MG tablet, Take 1 tablet (40 mg total) by mouth daily., Disp: 90 tablet, Rfl: 1   Probiotic Product (PROBIOTIC PO), Take by mouth daily., Disp: , Rfl:   Allergies  Allergen Reactions   Atorvastatin Other (See Comments)    Severe headache   Oxycodone Itching    hallucinations   Statins    Advair Diskus [Fluticasone-Salmeterol] Other (See Comments)    Mouth sores   Sulfonamide Derivatives Rash    OBJECTIVE: There were no vitals taken for this visit. Gen: Afebrile per daughter. Laying in bed. Nontoxic- but appears sig. Ill today.  HENT: AT. [place].  Eyes:Pupils Equal Round Reactive to light, Extraocular movements intact,  Conjunctiva without redness, discharge or icterus. Chest: No cough.  No shortness of breath. Neuro:Alert. Oriented x3.  Psych: Normal speech. Normal thought content and judgment..    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   - Low-sodium diet - Attempt to stay as active as possible - Continue baby aspirin - Intolerant to statins; could consider Lovaza  versus Zetia or WelChol.   - Follow-up 5.5 months   Moderate persistent asthma without complication Stable Continue Symbicort 2 puffs twice a day.   Continue albuterol 1 to 2 puffs every 6 hours as needed.    Depression with anxiety Stable Continue prozac 60 mg QD  Continue valium.  Indication for chronic benzo: Valium- anxiety and lower back spasms Medication and dose: Valium 2.5 mg/5 mg # pills per month: #45 Pain contract signed (Y/N): Y Date narcotic database last reviewed (include red flags):09/01/20 -Follow-up 6 months  Nausea/UTI/immunocompromise: Patient does not look as well as at like to see after having initiated treatment yesterday with Rocephin and Omnicef. Zofran prescribed for nausea. They were  encouraged to make sure she remains hydrated and able to tolerate p.o. and meds.  If she has fevers, chills or her condition does not improve over the next 24 hours, I have encouraged them to take her to the emergency room for further evaluation-sooner if they feel she is worsening.  Daughter reported understanding.  Polymyalgia rheumatica/giant cell arteritis/immunocompromised: Under the care of rheumatology for condition.  Prescribed prednisone and Actemra Unfortunately she has missed her injection this month secondary to UTI and illness. Meds ordered this encounter  Medications   olmesartan (BENICAR) 40 MG tablet    Sig: Take 1 tablet (40 mg total) by mouth daily.    Dispense:  90 tablet    Refill:  1   FLUoxetine (PROZAC) 20 MG capsule    Sig: Take 3 capsules (60 mg total) by mouth daily.    Dispense:  270 capsule    Refill:  1   ondansetron (ZOFRAN ODT) 8 MG disintegrating tablet    Sig: Take 1 tablet (8 mg total) by mouth every 8 (eight) hours as needed for nausea or vomiting.    Dispense:  30 tablet    Refill:  0   diazepam (VALIUM) 5 MG tablet    Sig: 1/2 tab PO in the morning and 1 tab QHS PRN.    Dispense:  135 tablet    Refill:  1    No orders of the defined types were placed in this encounter.   Howard Pouch, DO 09/01/2020

## 2020-09-08 ENCOUNTER — Ambulatory Visit: Payer: Medicare Other | Admitting: Neurology

## 2020-09-14 DIAGNOSIS — M316 Other giant cell arteritis: Secondary | ICD-10-CM | POA: Diagnosis not present

## 2020-10-12 DIAGNOSIS — M316 Other giant cell arteritis: Secondary | ICD-10-CM | POA: Diagnosis not present

## 2020-10-17 ENCOUNTER — Ambulatory Visit (INDEPENDENT_AMBULATORY_CARE_PROVIDER_SITE_OTHER): Payer: Medicare Other | Admitting: Family Medicine

## 2020-10-17 ENCOUNTER — Encounter: Payer: Self-pay | Admitting: Family Medicine

## 2020-10-17 ENCOUNTER — Other Ambulatory Visit: Payer: Self-pay

## 2020-10-17 ENCOUNTER — Telehealth: Payer: Self-pay | Admitting: Family Medicine

## 2020-10-17 VITALS — BP 177/72 | HR 74 | Temp 99.2°F

## 2020-10-17 DIAGNOSIS — R8290 Unspecified abnormal findings in urine: Secondary | ICD-10-CM | POA: Diagnosis not present

## 2020-10-17 DIAGNOSIS — N390 Urinary tract infection, site not specified: Secondary | ICD-10-CM

## 2020-10-17 LAB — POCT URINALYSIS DIPSTICK
Bilirubin, UA: NEGATIVE
Blood, UA: NEGATIVE
Glucose, UA: NEGATIVE
Ketones, UA: NEGATIVE
Nitrite, UA: POSITIVE
Protein, UA: POSITIVE — AB
Spec Grav, UA: 1.015 (ref 1.010–1.025)
Urobilinogen, UA: 0.2 E.U./dL
pH, UA: 5.5 (ref 5.0–8.0)

## 2020-10-17 MED ORDER — CEFTRIAXONE SODIUM 1 G IJ SOLR
1.0000 g | Freq: Once | INTRAMUSCULAR | Status: AC
Start: 2020-10-17 — End: 2020-10-17
  Administered 2020-10-17: 1 g via INTRAMUSCULAR

## 2020-10-17 MED ORDER — CEFDINIR 300 MG PO CAPS: 300.0000 mg | ORAL_CAPSULE | Freq: Two times a day (BID) | ORAL | 0 refills | Status: DC

## 2020-10-17 NOTE — Telephone Encounter (Signed)
Called [name] and asked her to research secondary ins ([employer] for Life).  Ins will not go thru using SS for pt or spouse.

## 2020-10-17 NOTE — Progress Notes (Signed)
This visit occurred during the SARS-CoV-2 public health emergency.  Safety protocols were in place, including screening questions prior to the visit, additional usage of staff PPE, and extensive cleaning of exam room while observing appropriate contact time as indicated for disinfecting solutions.    [name] , 02/13/1930, 87 y.o., female MRN: [id number] Patient Care Team    Relationship Specialty Notifications Start End  Ma Hillock, DO PCP - General Family Medicine  03/16/15   Hermelinda Medicus, MD  Internal Medicine  07/11/15    Comment: Rheumotology  Fay Records, MD Consulting Physician Cardiology  07/11/15   Lafayette Dragon, MD (Inactive)  Gastroenterology  07/11/15   Tanda Rockers, MD Consulting Physician Pulmonary Disease  07/11/15   Jessy Oto, MD Consulting Physician Orthopedic Surgery  07/12/16   Magnus Sinning, MD Consulting Physician Physical Medicine and Rehabilitation  07/12/16   Center, Skin Surgery    07/15/17   [facility]  Dentistry  07/15/17     Chief Complaint  Patient presents with   Urinary Tract Infection     Subjective: Pt presents for an OV with complaints of dysuria that started yesterday, with foul smelling urine. She has frequent UTI about 4x a year.She denies nausea or vomit. She had some memory changes, which is a symptom for her UTI pretty consistently. She denies chills or low back pain.   Depression screen [facility] 2/9 08/17/2020 05/10/2020 01/17/2020 07/28/2018 01/13/2018  Decreased Interest 0 0 0 0 -  Down, Depressed, Hopeless 0 0 0 1 1  PHQ - 2 Score 0 0 0 1 1  Altered sleeping - - - 0 1  Tired, decreased energy - - - 3 1  Change in appetite - - - 0 0  Feeling bad or failure about yourself  - - - 1 0  Trouble concentrating - - - 1 1  Moving slowly or fidgety/restless - - - 0 0  Suicidal thoughts - - - 0 0  PHQ-9 Score - - - 6 4  Difficult doing work/chores - - - Not difficult at all Somewhat difficult    Allergies  Allergen Reactions    Atorvastatin Other (See Comments)    Severe headache   Oxycodone Itching    hallucinations   Statins    Advair Diskus [Fluticasone-Salmeterol] Other (See Comments)    Mouth sores   Sulfonamide Derivatives Rash   Social History   Social History Narrative   Married, husband Wildon earl [place].    Lives with [name] and [name].   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   Patient takes a daily vitamin   She wears her seat belt. Exercises greater than 3 times a week.   Wears 2 hearing aids. No dentures.   "Sometimes "requires assistive device for walking, either a walker or wheelchair.   There is a smoke detector in her home. There are firearms in her home. She feels safe in her relationships.   Right handed   Past Medical History:  Diagnosis Date   Abdominal bloating    Abnormal LFTs (liver function tests)    Ankle fracture, bimalleolar, closed, right, initial encounter    Anxiety disorder    Arthritis    Asthma    Bimalleolar ankle fracture, right, closed, initial encounter 01/06/2019   Depression    Diverticulosis    Edema    Fatty liver    Fibromyalgia    GERD (gastroesophageal reflux disease)    Giant cell arteritis ([place])  No biopsy secondary to steroid use   Hx of adenomatous colonic polyps    Hyperlipidemia    Hypertension    IBS (irritable bowel syndrome)    Lumbar radiculitis    Osteoporosis 09/2017   T score -2.6 left femoral neck   Peptic ulcer disease    Skin cancer    shoulder, leg, right eye (? squamous by resection)   Thoracic radiculitis    Thyroid nodule    "removed"   Urinary incontinence    Vertigo    Past Surgical History:  Procedure Laterality Date   ABDOMINAL HYSTERECTOMY     BACK SURGERY  2005   lower back   EYE SURGERY Right 2006   LIVER BIOPSY     fatty liver   NASAL SEPTUM SURGERY     ORIF ANKLE FRACTURE Right 01/11/2019   Procedure: OPEN REDUCTION INTERNAL FIXATION (ORIF) RIGHT BIMALLEOLAR ANKLE FRACTURE;  Surgeon: Leandrew Koyanagi, MD;   Location: [facility];  Service: Orthopedics;  Laterality: Right;   right knee surgery  2006   arthroscopy   right leg skin cancer surgery  2014   right shoulder skin cancer excision  2012   SKIN CANCER EXCISION     THYROIDECTOMY     Family History  Problem Relation Age of Onset   Colon polyps Maternal Aunt    Diabetes Father    Heart disease Father    Heart disease Mother    Lung disease Mother    COPD Mother    Lung cancer Brother    Cancer Brother        lung   COPD Brother    Stroke Brother    Lung cancer Sister    COPD Sister    Kidney disease Other        niece-mat side   Arthritis Other    Diabetes Son    Arthritis Daughter    Colon cancer Neg Hx    Allergies as of 10/17/2020       Reactions   Atorvastatin Other (See Comments)   Severe headache   Oxycodone Itching   hallucinations   Statins    Advair Diskus [fluticasone-salmeterol] Other (See Comments)   Mouth sores   Sulfonamide Derivatives Rash        Medication List        Accurate as of October 17, 2020 12:01 PM. If you have any questions, ask your nurse or doctor.          albuterol 108 (90 Base) MCG/ACT inhaler Commonly known as: ProAir HFA 2 puffs up to every 4 hours if can't catch your breath   alendronate 70 MG tablet Commonly known as: FOSAMAX Take by mouth.   aspirin EC 81 MG tablet Take 1 tablet (81 mg total) by mouth 2 (two) times daily.   budesonide-formoterol 80-4.5 MCG/ACT inhaler Commonly known as: Symbicort USE 2 INHALATIONS TWICE A DAY   calcium-vitamin D 500-200 MG-UNIT tablet Commonly known as: OSCAL WITH D Take 1 tablet by mouth 3 (three) times daily.   cefdinir 300 MG capsule Commonly known as: OMNICEF Take 1 capsule (300 mg total) by mouth 2 (two) times daily. Started by: Howard Pouch, DO   Cortisporin-TC 3.05-18-08-0.5 MG/ML OTIC suspension Generic drug: neomycin-colistin-hydrocortisone-thonzonium Place 3 drops into the right ear 4 (four) times  daily.   Debrox 6.5 % OTIC solution Generic drug: carbamide peroxide Use once a month to keep cerumen form building up.   diazepam 5 MG tablet Commonly known  as: VALIUM 1/2 tab PO in the morning and 1 tab QHS PRN.   FLUoxetine 20 MG capsule Commonly known as: PROZAC Take 3 capsules (60 mg total) by mouth daily.   olmesartan 40 MG tablet Commonly known as: BENICAR Take 1 tablet (40 mg total) by mouth daily.   omega-3 acid ethyl esters 1 g capsule Commonly known as: LOVAZA TAKE 2 CAPSULES TWICE A DAY   ondansetron 8 MG disintegrating tablet Commonly known as: Zofran ODT Take 1 tablet (8 mg total) by mouth every 8 (eight) hours as needed for nausea or vomiting.   predniSONE 5 MG tablet Commonly known as: DELTASONE Take 5 mg by mouth daily.   PROBIOTIC PO Take by mouth daily.   Restasis 0.05 % ophthalmic emulsion Generic drug: cycloSPORINE 1 drop 2 (two) times daily.   tocilizumab 4 mg/kg in sodium chloride 0.9 % Inject 280 mg into the vein.   tocilizumab 400 MG/20ML Soln injection Commonly known as: ACTEMRA   traMADol 50 MG tablet Commonly known as: ULTRAM TAKE ONE TABLET BY MOUTH EVERY 6 HOURS AS NEEDED FOR MODERATE PAIN   triamcinolone 0.025 % ointment Commonly known as: KENALOG Apply a very thin layer to affected area every 2 days.   Vitamin B-12 5000 MCG Subl Place 0.0001 tablets (0.5 mcg total) under the tongue daily. Place 2500MCG under the tongue daily.   Vitamin D3 20 MCG (800 UNIT) Tabs Take 1 tablet by mouth daily.        All past medical history, surgical history, allergies, family history, immunizations andmedications were updated in the EMR today and reviewed under the history and medication portions of their EMR.     ROS: Negative, with the exception of above mentioned in HPI   Objective:  BP (!) 177/72   Pulse 74   Temp 99.2 F (37.3 C) (Oral)   SpO2 94%  There is no height or weight on file to calculate BMI. Gen: Afebrile. No acute  distress. Nontoxic in appearance, well developed, well nourished.  HENT: AT. [place].  Eyes:Pupils Equal Round Reactive to light, Extraocular movements intact,  Conjunctiva without redness, discharge or icterus. Abd: Soft. NTND. BS present MSK: no cva tenderness Skin: no rashes, purpura or petechiae.  Neuro: Normal gait. PERLA. EOMi. Alert. Oriented x3  Psych: Normal affect, dress and demeanor. Normal speech. Normal thought content and judgment.  No results found. No results found. Results for orders placed or performed in visit on 10/17/20 (from the past 24 hour(s))  POCT Urinalysis Dipstick     Status: Abnormal   Collection Time: 10/17/20 11:35 AM  Result Value Ref Range   Color, UA     Clarity, UA     Glucose, UA Negative Negative   Bilirubin, UA neg    Ketones, UA neg    Spec Grav, UA 1.015 1.010 - 1.025   Blood, UA neg    pH, UA 5.5 5.0 - 8.0   Protein, UA Positive (A) Negative   Urobilinogen, UA 0.2 0.2 or 1.0 E.U./dL   Nitrite, UA pos    Leukocytes, UA Moderate (2+) (A) Negative   Appearance     Odor foul     Assessment/Plan: [name] is a 87 y.o. female present for OV for  Frequent UTI/abnl urine Urine appears infectious today, pt also with low grade temp.  - rocpehin 1g IM provided.  - omnicef BID to start tonight. Extended course.  - POCT Urinalysis Dipstick> +leuks, smell, nitrites - Urinalysis w microscopic + reflex  cultur - cefTRIAXone (ROCEPHIN) injection 1 g F/u PRN, sooner if worsening.    Reviewed expectations re: course of current medical issues. Discussed self-management of symptoms. Outlined signs and symptoms indicating need for more acute intervention. Patient verbalized understanding and all questions were answered. Patient received an After-Visit Summary.    Orders Placed This Encounter  Procedures   Urinalysis w microscopic + reflex cultur   POCT Urinalysis Dipstick   Meds ordered this encounter  Medications   cefdinir (OMNICEF) 300  MG capsule    Sig: Take 1 capsule (300 mg total) by mouth 2 (two) times daily.    Dispense:  20 capsule    Refill:  0   cefTRIAXone (ROCEPHIN) injection 1 g    Referral Orders  No referral(s) requested today     Note is dictated utilizing voice recognition software. Although note has been proof read prior to signing, occasional typographical errors still can be missed. If any questions arise, please do not hesitate to call for verification.   electronically signed by:  Howard Pouch, DO  [place]

## 2020-10-20 LAB — URINALYSIS W MICROSCOPIC + REFLEX CULTURE
Bilirubin Urine: NEGATIVE
Glucose, UA: NEGATIVE
Hgb urine dipstick: NEGATIVE
Hyaline Cast: NONE SEEN /LPF
Ketones, ur: NEGATIVE
Nitrites, Initial: POSITIVE — AB
RBC / HPF: NONE SEEN /HPF (ref 0–2)
Specific Gravity, Urine: 1.01 (ref 1.001–1.035)
WBC, UA: >=60 /HPF — AB (ref 0–5)
pH: <=5 (ref 5.0–8.0)

## 2020-10-20 LAB — URINE CULTURE
MICRO NUMBER:: 12195923
SPECIMEN QUALITY:: ADEQUATE

## 2020-10-20 LAB — CULTURE INDICATED

## 2020-10-27 ENCOUNTER — Telehealth: Payer: Self-pay | Admitting: Family Medicine

## 2020-10-27 NOTE — Telephone Encounter (Signed)
Patient was provided with a 10-day course for UTI.  That is plenty of antibiotics and double the usual course.  I do not think she needs any further antibiotics at this time.  Just time and hydration.

## 2020-10-27 NOTE — Telephone Encounter (Signed)
Please advise 

## 2020-10-27 NOTE — Telephone Encounter (Signed)
Patient's daughter states Cristie Hem has completed current antibiotics regiment but still feels some symptoms. They wonder if patient needs another round of antibiotics or other treatment. Please call patient's daughter to advise.

## 2020-10-27 NOTE — Telephone Encounter (Signed)
Spoke with pt daughter regarding abx request and instructions.

## 2020-11-16 DIAGNOSIS — M316 Other giant cell arteritis: Secondary | ICD-10-CM | POA: Diagnosis not present

## 2020-12-18 DIAGNOSIS — M316 Other giant cell arteritis: Secondary | ICD-10-CM | POA: Diagnosis not present

## 2020-12-18 DIAGNOSIS — Z79899 Other long term (current) drug therapy: Secondary | ICD-10-CM | POA: Diagnosis not present

## 2020-12-18 DIAGNOSIS — G441 Vascular headache, not elsewhere classified: Secondary | ICD-10-CM | POA: Diagnosis not present

## 2020-12-18 DIAGNOSIS — M2550 Pain in unspecified joint: Secondary | ICD-10-CM | POA: Diagnosis not present

## 2021-01-11 ENCOUNTER — Other Ambulatory Visit: Payer: Self-pay | Admitting: Family Medicine

## 2021-01-15 ENCOUNTER — Telehealth: Payer: Self-pay | Admitting: Family Medicine

## 2021-01-15 NOTE — Telephone Encounter (Signed)
Pt daughter calling and said she thinks pt has an UTI, she said she is only going off of things she said that are similar to what she said when she has had an UTI in the past but that she isnt having any other symptoms. She wanted to know if she can drop of a specimen to have it checked. Call back number [phone]

## 2021-01-15 NOTE — Telephone Encounter (Signed)
Please assist pt with any acutes

## 2021-01-15 NOTE — Telephone Encounter (Signed)
Spoke with pt daughter and informed her that pt will need to an appt

## 2021-01-15 NOTE — Telephone Encounter (Signed)
Lvm for pt to call back and schedule.  

## 2021-01-16 DIAGNOSIS — M316 Other giant cell arteritis: Secondary | ICD-10-CM | POA: Diagnosis not present

## 2021-01-17 ENCOUNTER — Other Ambulatory Visit: Payer: Self-pay

## 2021-01-17 ENCOUNTER — Ambulatory Visit (INDEPENDENT_AMBULATORY_CARE_PROVIDER_SITE_OTHER): Payer: Medicare Other | Admitting: Family Medicine

## 2021-01-17 ENCOUNTER — Encounter: Payer: Self-pay | Admitting: Family Medicine

## 2021-01-17 VITALS — BP 138/71 | HR 76 | Temp 98.1°F | Ht 63.0 in | Wt 196.0 lb

## 2021-01-17 DIAGNOSIS — Z23 Encounter for immunization: Secondary | ICD-10-CM | POA: Diagnosis not present

## 2021-01-17 DIAGNOSIS — E781 Pure hyperglyceridemia: Secondary | ICD-10-CM | POA: Diagnosis not present

## 2021-01-17 DIAGNOSIS — N390 Urinary tract infection, site not specified: Secondary | ICD-10-CM

## 2021-01-17 LAB — POCT URINALYSIS DIPSTICK
Bilirubin, UA: NEGATIVE
Blood, UA: NEGATIVE
Glucose, UA: NEGATIVE
Nitrite, UA: POSITIVE
Protein, UA: POSITIVE — AB
Spec Grav, UA: 1.025 (ref 1.010–1.025)
Urobilinogen, UA: 1 E.U./dL
pH, UA: 5.5 (ref 5.0–8.0)

## 2021-01-17 MED ORDER — CEFDINIR 300 MG PO CAPS: 300.0000 mg | ORAL_CAPSULE | Freq: Two times a day (BID) | ORAL | 0 refills | Status: DC

## 2021-01-17 MED ORDER — OMEGA-3-ACID ETHYL ESTERS 1 G PO CAPS
2.0000 | ORAL_CAPSULE | Freq: Two times a day (BID) | ORAL | 5 refills | Status: DC
Start: 2021-01-17 — End: 2021-01-17

## 2021-01-17 MED ORDER — CEFTRIAXONE SODIUM 1 G IJ SOLR
1.0000 g | Freq: Once | INTRAMUSCULAR | Status: AC
  Administered 2021-01-17: 1 g via INTRAMUSCULAR

## 2021-01-17 MED ORDER — OMEGA-3-ACID ETHYL ESTERS 1 G PO CAPS: 2.0000 | ORAL_CAPSULE | Freq: Two times a day (BID) | ORAL | 2 refills | Status: DC

## 2021-01-17 NOTE — Progress Notes (Signed)
This visit occurred during the SARS-CoV-2 public health emergency.  Safety protocols were in place, including screening questions prior to the visit, additional usage of staff PPE, and extensive cleaning of exam room while observing appropriate contact time as indicated for disinfecting solutions.    [name] , 10-26-1929, 87 y.o., female MRN: [id number] Patient Care Team    Relationship Specialty Notifications Start End  Ma Hillock, DO PCP - General Family Medicine  03/16/15   Hermelinda Medicus, MD  Internal Medicine  07/11/15    Comment: Rheumotology  Fay Records, MD Consulting Physician Cardiology  07/11/15   Lafayette Dragon, MD (Inactive)  Gastroenterology  07/11/15   Tanda Rockers, MD Consulting Physician Pulmonary Disease  07/11/15   Jessy Oto, MD Consulting Physician Orthopedic Surgery  07/12/16   Magnus Sinning, MD Consulting Physician Physical Medicine and Rehabilitation  07/12/16   [facility]    07/15/17   [facility]  Dent[facility]  07/15/17     Chief Complaint  Patient presents with   Dysuria    Pt c/o dysuria x 3 days;      Subjective: Pt presents for an OV with complaints of burning with urination and urinary frequency. Pt has frequent UTI every few months. Responds best to rocephin and omnicef. She denies fever, chills, nausea or vomit. She denies low back pain or abd pressure.   Depression screen [facility] 2/9 01/17/2021 08/17/2020 05/10/2020 01/17/2020 07/28/2018  Decreased Interest 0 0 0 0 0  Down, Depressed, Hopeless 0 0 0 0 1  PHQ - 2 Score 0 0 0 0 1  Altered sleeping - - - - 0  Tired, decreased energy - - - - 3  Change in appetite - - - - 0  Feeling bad or failure about yourself  - - - - 1  Trouble concentrating - - - - 1  Moving slowly or fidgety/restless - - - - 0  Suicidal thoughts - - - - 0  PHQ-9 Score - - - - 6  Difficult doing work/chores - - - - Not difficult at all    Allergies  Allergen Reactions   Atorvastatin Other (See Comments)     Severe headache   Oxycodone Itching    hallucinations   Statins    Advair Diskus [Fluticasone-Salmeterol] Other (See Comments)    Mouth sores   Sulfonamide Derivatives Rash   Social History   Social History Narrative   Married, husband Wildon earl Hilliard.    Lives with [name] and [name].   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   Patient takes a daily vitamin   She wears her seat belt. Exercises greater than 3 times a week.   Wears 2 hearing aids. No dentures.   "Sometimes "requires assistive device for walking, either a walker or wheelchair.   There is a smoke detector in her home. There are firearms in her home. She feels safe in her relationships.   Right handed   Past Medical History:  Diagnosis Date   Abdominal bloating    Abnormal LFTs (liver function tests)    Ankle fracture, bimalleolar, closed, right, initial encounter    Anxiety disorder    Arthritis    Asthma    Bimalleolar ankle fracture, right, closed, initial encounter 01/06/2019   Depression    Diverticulosis    Edema    Fatty liver    Fibromyalgia    GERD (gastroesophageal reflux disease)    Giant cell arteritis ([place])  No biopsy secondary to steroid use   Hx of adenomatous colonic polyps    Hyperlipidemia    Hypertension    IBS (irritable bowel syndrome)    Lumbar radiculitis    Osteoporosis 09/2017   T score -2.6 left femoral neck   Peptic ulcer disease    Skin cancer    shoulder, leg, right eye (? squamous by resection)   Thoracic radiculitis    Thyroid nodule    "removed"   Urinary incontinence    Vertigo    Past Surgical History:  Procedure Laterality Date   ABDOMINAL HYSTERECTOMY     BACK SURGERY  2005   lower back   EYE SURGERY Right 2006   LIVER BIOPSY     fatty liver   NASAL SEPTUM SURGERY     ORIF ANKLE FRACTURE Right 01/11/2019   Procedure: OPEN REDUCTION INTERNAL FIXATION (ORIF) RIGHT BIMALLEOLAR ANKLE FRACTURE;  Surgeon: Leandrew Koyanagi, MD;  Location: [place];   Service: Orthopedics;  Laterality: Right;   right knee surgery  2006   arthroscopy   right leg skin cancer surgery  2014   right shoulder skin cancer excision  2012   SKIN CANCER EXCISION     THYROIDECTOMY     Family History  Problem Relation Age of Onset   Colon polyps Maternal Aunt    Diabetes Father    Heart disease Father    Heart disease Mother    Lung disease Mother    COPD Mother    Lung cancer Brother    Cancer Brother        lung   COPD Brother    Stroke Brother    Lung cancer Sister    COPD Sister    Kidney disease Other        niece-mat side   Arthritis Other    Diabetes Son    Arthritis Daughter    Colon cancer Neg Hx    Allergies as of 01/17/2021       Reactions   Atorvastatin Other (See Comments)   Severe headache   Oxycodone Itching   hallucinations   Statins    Advair Diskus [fluticasone-salmeterol] Other (See Comments)   Mouth sores   Sulfonamide Derivatives Rash        Medication List        Accurate as of January 17, 2021 11:49 AM. If you have any questions, ask your nurse or doctor.          albuterol 108 (90 Base) MCG/ACT inhaler Commonly known as: VENTOLIN HFA USE 2 INHALATIONS UP TO EVERY 4 HOURS IF CANNOT CATCH YOUR BREATH   alendronate 70 MG tablet Commonly known as: FOSAMAX Take by mouth.   aspirin EC 81 MG tablet Take 1 tablet (81 mg total) by mouth 2 (two) times daily.   budesonide-formoterol 80-4.5 MCG/ACT inhaler Commonly known as: Symbicort USE 2 INHALATIONS TWICE A DAY   calcium-vitamin D 500-200 MG-UNIT tablet Commonly known as: OSCAL WITH D Take 1 tablet by mouth 3 (three) times daily.   cefdinir 300 MG capsule Commonly known as: OMNICEF Take 1 capsule (300 mg total) by mouth 2 (two) times daily.   Cortisporin-TC 3.05-18-08-0.5 MG/ML OTIC suspension Generic drug: neomycin-colistin-hydrocortisone-thonzonium Place 3 drops into the right ear 4 (four) times daily.   Debrox 6.5 % OTIC solution Generic drug:  carbamide peroxide Use once a month to keep cerumen form building up.   diazepam 5 MG tablet Commonly known as: VALIUM 1/2 tab  PO in the morning and 1 tab QHS PRN.   FLUoxetine 20 MG capsule Commonly known as: PROZAC Take 3 capsules (60 mg total) by mouth daily.   olmesartan 40 MG tablet Commonly known as: BENICAR Take 1 tablet (40 mg total) by mouth daily.   omega-3 acid ethyl esters 1 g capsule Commonly known as: LOVAZA TAKE 2 CAPSULES TWICE A DAY   ondansetron 8 MG disintegrating tablet Commonly known as: Zofran ODT Take 1 tablet (8 mg total) by mouth every 8 (eight) hours as needed for nausea or vomiting.   predniSONE 5 MG tablet Commonly known as: DELTASONE Take 10 mg by mouth daily.   PROBIOTIC PO Take by mouth daily.   Restasis 0.05 % ophthalmic emulsion Generic drug: cycloSPORINE 1 drop 2 (two) times daily.   tocilizumab 4 mg/kg in sodium chloride 0.9 % Inject 280 mg into the vein.   tocilizumab 400 MG/20ML Soln injection Commonly known as: ACTEMRA   traMADol 50 MG tablet Commonly known as: ULTRAM TAKE ONE TABLET BY MOUTH EVERY 6 HOURS AS NEEDED FOR MODERATE PAIN   triamcinolone 0.025 % ointment Commonly known as: KENALOG Apply a very thin layer to affected area every 2 days.   Vitamin B-12 5000 MCG Subl Place 0.0001 tablets (0.5 mcg total) under the tongue daily. Place 2500MCG under the tongue daily.   Vitamin D3 20 MCG (800 UNIT) Tabs Take 1 tablet by mouth daily.        All past medical history, surgical history, allergies, family history, immunizations andmedications were updated in the EMR today and reviewed under the history and medication portions of their EMR.     ROS: Negative, with the exception of above mentioned in HPI   Objective:  BP (!) 144/65   Pulse 76   Temp 98.1 F (36.7 C) (Oral)   SpO2 95%  There is no height or weight on file to calculate BMI. Gen: Afebrile. No acute distress. Nontoxic in appearance, well developed,  well nourished.  HENT: AT. [place].  Eyes:Pupils Equal Round Reactive to light, Extraocular movements intact,  Conjunctiva without redness, discharge or icterus. CV: RRR  Chest: CTAB, no wheeze or crackles. Good air movement, normal resp effort.  Abd: Soft. NTND. BS present Neuro:   Alert. Oriented x3  Psych: Normal affect, dress and demeanor. Normal speech. Normal thought content and judgment.  No results found. No results found. Results for orders placed or performed in visit on 01/17/21 (from the past 24 hour(s))  POCT Urinalysis Dipstick     Status: Abnormal   Collection Time: 01/17/21 11:37 AM  Result Value Ref Range   Color, UA yellow    Clarity, UA hazy    Glucose, UA Negative Negative   Bilirubin, UA negative    Ketones, UA trace    Spec Grav, UA 1.025 1.010 - 1.025   Blood, UA negative    pH, UA 5.5 5.0 - 8.0   Protein, UA Positive (A) Negative   Urobilinogen, UA 1.0 0.2 or 1.0 E.U./dL   Nitrite, UA positive    Leukocytes, UA Small (1+) (A) Negative   Appearance     Odor      Assessment/Plan: [name] is a 87 y.o. female present for OV for  Frequent UTI Appears infectious.  - POCT Urinalysis Dipstick - Urinalysis w microscopic + reflex cultur - cefTRIAXone (ROCEPHIN) injection 1 g - omnicef BID starting tomorrow.   Need for influenza vaccination - Flu Vaccine QUAD High Dose(Fluad)  Hypertriglyceridemia Pt asked  for refills.  - omega-3 acid ethyl esters (LOVAZA) 1 g capsule; Take 2 capsules (2 g total) by mouth 2 (two) times daily.  Dispense: 360 capsule; Refill: 2  Reviewed expectations re: course of current medical issues. Discussed self-management of symptoms. Outlined signs and symptoms indicating need for more acute intervention. Patient verbalized understanding and all questions were answered. Patient received an After-Visit Summary.    Orders Placed This Encounter  Procedures   Flu Vaccine QUAD High Dose(Fluad)   Urinalysis w microscopic +  reflex cultur   POCT Urinalysis Dipstick   Meds ordered this encounter  Medications   cefTRIAXone (ROCEPHIN) injection 1 g   Referral Orders  No referral(s) requested today     Note is dictated utilizing voice recognition software. Although note has been proof read prior to signing, occasional typographical errors still can be missed. If any questions arise, please do not hesitate to call for verification.   electronically signed by:  Howard Pouch, DO  [place]

## 2021-01-20 LAB — CULTURE INDICATED

## 2021-01-20 LAB — URINALYSIS W MICROSCOPIC + REFLEX CULTURE
Bilirubin Urine: NEGATIVE
Glucose, UA: NEGATIVE
Hgb urine dipstick: NEGATIVE
Ketones, ur: NEGATIVE
Nitrites, Initial: POSITIVE — AB
Specific Gravity, Urine: 1.021 (ref 1.001–1.035)
pH: <=5 (ref 5.0–8.0)

## 2021-01-20 LAB — URINE CULTURE
MICRO NUMBER:: 12587902
SPECIMEN QUALITY:: ADEQUATE

## 2021-01-24 ENCOUNTER — Other Ambulatory Visit: Payer: Self-pay | Admitting: Family Medicine

## 2021-01-24 DIAGNOSIS — I10 Essential (primary) hypertension: Secondary | ICD-10-CM

## 2021-02-12 ENCOUNTER — Other Ambulatory Visit: Payer: Self-pay | Admitting: Family Medicine

## 2021-02-13 DIAGNOSIS — M316 Other giant cell arteritis: Secondary | ICD-10-CM | POA: Diagnosis not present

## 2021-02-15 ENCOUNTER — Other Ambulatory Visit: Payer: Self-pay | Admitting: Family Medicine

## 2021-02-15 DIAGNOSIS — I10 Essential (primary) hypertension: Secondary | ICD-10-CM

## 2021-02-15 NOTE — Telephone Encounter (Signed)
Needs OV for refills.  

## 2021-02-28 ENCOUNTER — Other Ambulatory Visit: Payer: Self-pay | Admitting: Family Medicine

## 2021-03-22 DIAGNOSIS — M316 Other giant cell arteritis: Secondary | ICD-10-CM | POA: Diagnosis not present

## 2021-03-26 ENCOUNTER — Telehealth: Payer: Self-pay

## 2021-03-26 NOTE — Telephone Encounter (Signed)
Pt daughter called stating that pt had another UTI. No available appts until January 30th. Daughter states she already has specimen.

## 2021-03-26 NOTE — Telephone Encounter (Signed)
Advised pt to either go to UC or can call office in the morning to see if any available appts

## 2021-03-26 NOTE — Telephone Encounter (Signed)
No appts available in [facility] division

## 2021-03-27 ENCOUNTER — Encounter: Payer: Self-pay | Admitting: Physician Assistant

## 2021-03-27 ENCOUNTER — Ambulatory Visit (INDEPENDENT_AMBULATORY_CARE_PROVIDER_SITE_OTHER): Payer: Medicare Other | Admitting: Physician Assistant

## 2021-03-27 ENCOUNTER — Other Ambulatory Visit: Payer: Self-pay

## 2021-03-27 VITALS — BP 132/66 | HR 77 | Temp 98.3°F | Wt 198.0 lb

## 2021-03-27 DIAGNOSIS — N3001 Acute cystitis with hematuria: Secondary | ICD-10-CM

## 2021-03-27 LAB — POCT URINALYSIS DIPSTICK
Bilirubin, UA: NEGATIVE
Glucose, UA: NEGATIVE
Ketones, UA: NEGATIVE
Leukocytes, UA: NEGATIVE
Nitrite, UA: POSITIVE
Protein, UA: POSITIVE — AB
Spec Grav, UA: 1.025 (ref 1.010–1.025)
Urobilinogen, UA: 0.2 E.U./dL
pH, UA: 5.5 (ref 5.0–8.0)

## 2021-03-27 MED ORDER — CEPHALEXIN 500 MG PO CAPS: 500.0000 mg | ORAL_CAPSULE | Freq: Two times a day (BID) | ORAL | 0 refills | Status: DC

## 2021-03-27 MED ORDER — CEFTRIAXONE SODIUM 1 G IJ SOLR
1.0000 g | Freq: Once | INTRAMUSCULAR | Status: AC
  Administered 2021-03-27: 1 g via INTRAMUSCULAR

## 2021-03-27 NOTE — Patient Instructions (Signed)
It was great to see you!  Antibiotic injection today Start keflex antibiotic tomorrow  Urology referral placed today  General instructions Make sure you: Pee until your bladder is empty. Do not hold pee for a long time. Empty your bladder after sex. Wipe from front to back after pooping if you are a female. Use each tissue one time when you wipe. Drink enough fluid to keep your pee pale yellow. Keep all follow-up visits as told by your doctor. This is important. Contact a doctor if: You do not get better after 1-2 days. Your symptoms go away and then come back. Get help right away if: You have very bad back pain. You have very bad pain in your lower belly. You have a fever. You are sick to your stomach (nauseous). You are throwing up.   Take care,  Inda Coke PA-C

## 2021-03-27 NOTE — Progress Notes (Signed)
[name] is a 88 y.o. female here for a UTI.   History of Present Illness:   Chief Complaint  Patient presents with   Urinary Tract Infection    Abdominal pain, back pain, burning with urination. Ongoing since 03/24/21   [name] presented to today's visit with her daughter, [name].  HPI  Recurrent UTIs [name] presents with c/o burning with urination, abdominal pain, and back pain that has been onset for three days. According to her daughter, she first experienced burning with urination on 03/24/21 and on the next day began to have increased fatigue as well as back/abdominal pain. Pt does have a hx of UTIs that occurs every few months, more than 3 a year.   Upon further discussion it was learned that [name] does wear feminine diapers to help manage her slight urinary incontinence. At this time they do believe that pt is having some many UTIs due to the confined space in her diaper. Pt has tried changing her diapers frequently as well as managing her feminine health to prevent infection but has not had much success. Despite this she noticed that she responded best to rocephin 1 g injection and antibiotics (omnicef) which was used to treat her most recent UTI in November of 2022. At this time they are interested in going about this course of treatment as well as following up with urology for further evaluation. Denies hematuria, fevers, chills or vaginal bleeding.    Past Medical History:  Diagnosis Date   Abdominal bloating    Abnormal LFTs (liver function tests)    Ankle fracture, bimalleolar, closed, right, initial encounter    Anxiety disorder    Arthritis    Asthma    Bimalleolar ankle fracture, right, closed, initial encounter 01/06/2019   Depression    Diverticulosis    Edema    Fatty liver    Fibromyalgia    GERD (gastroesophageal reflux disease)    Giant cell arteritis ([facility])    No biopsy secondary to steroid use   Hx of adenomatous colonic polyps     Hyperlipidemia    Hypertension    IBS (irritable bowel syndrome)    Lumbar radiculitis    Osteoporosis 09/2017   T score -2.6 left femoral neck   Peptic ulcer disease    Skin cancer    shoulder, leg, right eye (? squamous by resection)   Thoracic radiculitis    Thyroid nodule    "removed"   Urinary incontinence    Vertigo      Social History   Tobacco Use   Smoking status: Never   Smokeless tobacco: Never  Vaping Use   Vaping Use: Never used  Substance Use Topics   Alcohol use: No   Drug use: No    Past Surgical History:  Procedure Laterality Date   ABDOMINAL HYSTERECTOMY     BACK SURGERY  2005   lower back   EYE SURGERY Right 2006   LIVER BIOPSY     fatty liver   NASAL SEPTUM SURGERY     ORIF ANKLE FRACTURE Right 01/11/2019   Procedure: OPEN REDUCTION INTERNAL FIXATION (ORIF) RIGHT BIMALLEOLAR ANKLE FRACTURE;  Surgeon: Leandrew Koyanagi, MD;  Location: [place];  Service: Orthopedics;  Laterality: Right;   right knee surgery  2006   arthroscopy   right leg skin cancer surgery  2014   right shoulder skin cancer excision  2012   SKIN CANCER EXCISION     THYROIDECTOMY  Family History  Problem Relation Age of Onset   Colon polyps Maternal Aunt    Diabetes Father    Heart disease Father    Heart disease Mother    Lung disease Mother    COPD Mother    Lung cancer Brother    Cancer Brother        lung   COPD Brother    Stroke Brother    Lung cancer Sister    COPD Sister    Kidney disease Other        niece-mat side   Arthritis Other    Diabetes Son    Arthritis Daughter    Colon cancer Neg Hx     Allergies  Allergen Reactions   Atorvastatin Other (See Comments)    Severe headache   Oxycodone Itching    hallucinations   Statins    Advair Diskus [Fluticasone-Salmeterol] Other (See Comments)    Mouth sores   Sulfonamide Derivatives Rash    Current Medications:   Current  Outpatient Medications:    albuterol (VENTOLIN HFA) 108 (90 Base) MCG/ACT inhaler, USE 2 INHALATIONS UP TO EVERY 4 HOURS IF CANNOT CATCH YOUR BREATH, Disp: 68 g, Rfl: 0   alendronate (FOSAMAX) 70 MG tablet, Take by mouth., Disp: , Rfl:    aspirin EC 81 MG tablet, Take 1 tablet (81 mg total) by mouth 2 (two) times daily., Disp: 84 tablet, Rfl: 0   budesonide-formoterol (SYMBICORT) 80-4.5 MCG/ACT inhaler, USE 2 INHALATIONS TWICE A DAY, Disp: 30.6 g, Rfl: 12   calcium-vitamin D (OSCAL WITH D) 500-200 MG-UNIT tablet, Take 1 tablet by mouth 3 (three) times daily., Disp: 90 tablet, Rfl: 6   carbamide peroxide (DEBROX) 6.5 % OTIC solution, Use once a month to keep cerumen form building up., Disp: 15 mL, Rfl: 0   cefdinir (OMNICEF) 300 MG capsule, Take 1 capsule (300 mg total) by mouth 2 (two) times daily., Disp: 20 capsule, Rfl: 0   Cholecalciferol (VITAMIN D3) 20 MCG (800 UNIT) TABS, Take 1 tablet by mouth daily., Disp: 90 tablet, Rfl: 3   Cyanocobalamin (VITAMIN B-12) 5000 MCG SUBL, Place 0.0001 tablets (0.5 mcg total) under the tongue daily. Place 2500MCG under the tongue daily., Disp: 1 tablet, Rfl: 11   diazepam (VALIUM) 5 MG tablet, 1/2 tab PO in the morning and 1 tab QHS PRN., Disp: 135 tablet, Rfl: 1   FLUoxetine (PROZAC) 20 MG capsule, TAKE 3 CAPSULES DAILY, Disp: 90 capsule, Rfl: 0   neomycin-colistin-hydrocortisone-thonzonium (CORTISPORIN-TC) 3.05-18-08-0.5 MG/ML OTIC suspension, Place 3 drops into the right ear 4 (four) times daily., Disp: 10 mL, Rfl: 0   olmesartan (BENICAR) 40 MG tablet, TAKE 1 TABLET DAILY, Disp: 30 tablet, Rfl: 0   omega-3 acid ethyl esters (LOVAZA) 1 g capsule, Take 2 capsules (2 g total) by mouth 2 (two) times daily., Disp: 360 capsule, Rfl: 2   ondansetron (ZOFRAN ODT) 8 MG disintegrating tablet, Take 1 tablet (8 mg total) by mouth every 8 (eight) hours as needed for nausea or vomiting., Disp: 30 tablet, Rfl: 0   predniSONE (DELTASONE) 5 MG tablet, Take 10  mg by mouth daily., Disp: , Rfl:    Probiotic Product (PROBIOTIC PO), Take by mouth daily., Disp: , Rfl:    RESTASIS 0.05 % ophthalmic emulsion, 1 drop 2 (two) times daily., Disp: , Rfl:    tocilizumab (ACTEMRA) 400 MG/20ML SOLN injection, , Disp: , Rfl:    tocilizumab 4 mg/kg in sodium chloride 0.9 %, Inject 280 mg into the vein.,  Disp: , Rfl:    traMADol (ULTRAM) 50 MG tablet, TAKE ONE TABLET BY MOUTH EVERY 6 HOURS AS NEEDED FOR MODERATE PAIN, Disp: 40 tablet, Rfl: 0   triamcinolone (KENALOG) 0.025 % ointment, Apply a very thin layer to affected area every 2 days., Disp: 30 g, Rfl: 0   Review of Systems:   ROS Negative unless otherwise specified per HPI.  Vitals:   Vitals:   03/27/21 1433  BP: 132/66  Pulse: 77  Temp: 98.3 F (36.8 C)  TempSrc: Temporal  SpO2: 94%  Weight: 198 lb (89.8 kg)     Body mass index is 35.07 kg/m.  Physical Exam:   Physical Exam Vitals and nursing note reviewed.  Constitutional:      General: She is not in acute distress.    Appearance: She is well-developed. She is not ill-appearing or toxic-appearing.  Cardiovascular:     Rate and Rhythm: Normal rate and regular rhythm.     Pulses: Normal pulses.     Heart sounds: Normal heart sounds, S1 normal and S2 normal.  Pulmonary:     Effort: Pulmonary effort is normal.     Breath sounds: Normal breath sounds.  Skin:    General: Skin is warm and dry.  Neurological:     Mental Status: She is alert.     GCS: GCS eye subscore is 4. GCS verbal subscore is 5. GCS motor subscore is 6.  Psychiatric:        Speech: Speech normal.        Behavior: Behavior normal. Behavior is cooperative.   Results for orders placed or performed in visit on 03/27/21  POCT urinalysis dipstick  Result Value Ref Range   Color, UA yellow    Clarity, UA cloudy    Glucose, UA Negative Negative   Bilirubin, UA Negative    Ketones, UA Negative    Spec Grav, UA 1.025 1.010 - 1.025   Blood, UA trace    pH, UA 5.5  5.0 - 8.0   Protein, UA Positive (A) Negative   Urobilinogen, UA 0.2 0.2 or 1.0 E.U./dL   Nitrite, UA Positive    Leukocytes, UA Negative Negative   Appearance     Odor       Assessment and Plan:   Recurrent UTI No red flags Administered Rocephin 1 g, patient tolerated well Start keflex 500 mg twice daily x 7 days starting tomorrow Referral to urology for further evaluation will be placed for chronic UTI Provided patient with information on preventing UTIs Informed patient that if new/worsening symptoms occur to reach out to office or visit ED immediately  I,Havlyn C Ratchford,acting as a scribe for [employer], PA.,have documented all relevant documentation on the behalf of Inda Coke, PA,as directed by  Inda Coke, PA while in the presence of Inda Coke, [place].  I, Inda Coke, [place], have reviewed all documentation for this visit. The documentation on 03/27/21 for the exam, diagnosis, procedures, and orders are all accurate and complete.   Inda Coke, PA-C

## 2021-03-29 ENCOUNTER — Ambulatory Visit: Payer: Medicare Other | Admitting: Physician Assistant

## 2021-03-30 DIAGNOSIS — N952 Postmenopausal atrophic vaginitis: Secondary | ICD-10-CM | POA: Diagnosis not present

## 2021-03-30 DIAGNOSIS — N3946 Mixed incontinence: Secondary | ICD-10-CM | POA: Diagnosis not present

## 2021-03-30 DIAGNOSIS — N3020 Other chronic cystitis without hematuria: Secondary | ICD-10-CM | POA: Diagnosis not present

## 2021-03-30 LAB — URINE CULTURE
MICRO NUMBER:: 12854640
SPECIMEN QUALITY:: ADEQUATE

## 2021-04-19 ENCOUNTER — Telehealth: Payer: Self-pay

## 2021-04-19 NOTE — Telephone Encounter (Signed)
Patient has ran out of blood pressure meds.  Requesting 30 d/s called into [facility]  Patient has not received thru mail order.  Patient scheduled to see Dr. Cherlynn Kaiser for [facility] on 05/03/21.  I could not find documentation for transfer of care request.

## 2021-04-19 NOTE — Telephone Encounter (Signed)
Please advise on both rx and TOC. Pt is overdue for appt and was given 30 d/s already in past.

## 2021-04-20 ENCOUNTER — Telehealth: Payer: Self-pay

## 2021-04-20 ENCOUNTER — Other Ambulatory Visit: Payer: Self-pay

## 2021-04-20 DIAGNOSIS — I10 Essential (primary) hypertension: Secondary | ICD-10-CM

## 2021-04-20 MED ORDER — OLMESARTAN MEDOXOMIL 40 MG PO TABS: 40.0000 mg | ORAL_TABLET | Freq: Every day | ORAL | 0 refills | Status: DC

## 2021-04-20 NOTE — Telephone Encounter (Signed)
Sorry [name] not Sam.   Thanks!

## 2021-04-20 NOTE — Telephone Encounter (Signed)
Spoke with patient daughter [name], she is aware medication was called into pharmacy.

## 2021-04-20 NOTE — Telephone Encounter (Signed)
[name], daughter of patient, called to check on status of blood pressure refill request.  Patient is out of meds.  [facility]  olmesartan [facility]) 40 MG tablet [[id number]]     Please call [name] at [phone]

## 2021-04-20 NOTE — Telephone Encounter (Signed)
Patient is requesting to transfer from [facility] to [place].    Please advise.  Has appt scheduled 2/16 with Sam.

## 2021-04-20 NOTE — Progress Notes (Signed)
RN Clinical Supervisor received message that patient was completely out of blood pressure medications. Per Division Refill Policy, sent refill for Benicar 40 Mg Take One Tablet PO QD to preferred pharmacy for 30 days with no refills. Patient must schedule an appointment to receive further refills.

## 2021-04-23 NOTE — Telephone Encounter (Signed)
Approved. Very nice family and patient.

## 2021-04-23 NOTE — Telephone Encounter (Signed)
Patient is scheduled   

## 2021-04-23 NOTE — Telephone Encounter (Signed)
Noted  

## 2021-04-24 DIAGNOSIS — M316 Other giant cell arteritis: Secondary | ICD-10-CM | POA: Diagnosis not present

## 2021-05-03 ENCOUNTER — Encounter: Payer: Self-pay | Admitting: Family Medicine

## 2021-05-03 ENCOUNTER — Ambulatory Visit (INDEPENDENT_AMBULATORY_CARE_PROVIDER_SITE_OTHER): Payer: Medicare Other | Admitting: Family Medicine

## 2021-05-03 ENCOUNTER — Other Ambulatory Visit: Payer: Self-pay

## 2021-05-03 VITALS — BP 150/80 | HR 75 | Temp 98.6°F | Ht 63.0 in | Wt 195.2 lb

## 2021-05-03 DIAGNOSIS — J4540 Moderate persistent asthma, uncomplicated: Secondary | ICD-10-CM | POA: Diagnosis not present

## 2021-05-03 DIAGNOSIS — F418 Other specified anxiety disorders: Secondary | ICD-10-CM

## 2021-05-03 DIAGNOSIS — I10 Essential (primary) hypertension: Secondary | ICD-10-CM

## 2021-05-03 DIAGNOSIS — E781 Pure hyperglyceridemia: Secondary | ICD-10-CM | POA: Diagnosis not present

## 2021-05-03 DIAGNOSIS — R300 Dysuria: Secondary | ICD-10-CM

## 2021-05-03 DIAGNOSIS — M353 Polymyalgia rheumatica: Secondary | ICD-10-CM | POA: Diagnosis not present

## 2021-05-03 DIAGNOSIS — M316 Other giant cell arteritis: Secondary | ICD-10-CM | POA: Diagnosis not present

## 2021-05-03 DIAGNOSIS — M542 Cervicalgia: Secondary | ICD-10-CM | POA: Diagnosis not present

## 2021-05-03 LAB — POCT URINALYSIS DIPSTICK
Bilirubin, UA: NEGATIVE
Blood, UA: NEGATIVE
Glucose, UA: NEGATIVE
Ketones, UA: NEGATIVE
Leukocytes, UA: NEGATIVE
Nitrite, UA: NEGATIVE
Protein, UA: POSITIVE — AB
Spec Grav, UA: 1.025 (ref 1.010–1.025)
Urobilinogen, UA: 0.2 E.U./dL
pH, UA: 5.5 (ref 5.0–8.0)

## 2021-05-03 MED ORDER — ALENDRONATE SODIUM 70 MG PO TABS: 70.0000 mg | ORAL_TABLET | ORAL | 3 refills | Status: DC

## 2021-05-03 MED ORDER — WHEELCHAIR MISC
1.0000 | Freq: Once | 0 refills | Status: AC
Start: 2021-05-03 — End: 2021-05-03

## 2021-05-03 MED ORDER — BUDESONIDE-FORMOTEROL FUMARATE 160-4.5 MCG/ACT IN AERO: 2.0000 | INHALATION_SPRAY | Freq: Two times a day (BID) | RESPIRATORY_TRACT | 3 refills | Status: DC

## 2021-05-03 MED ORDER — DIAZEPAM 5 MG PO TABS: ORAL_TABLET | ORAL | 1 refills | Status: DC

## 2021-05-03 MED ORDER — FLUOXETINE HCL 20 MG PO CAPS: 60.0000 mg | ORAL_CAPSULE | Freq: Every day | ORAL | 0 refills | Status: DC

## 2021-05-03 MED ORDER — OMEGA-3-ACID ETHYL ESTERS 1 G PO CAPS: 2.0000 | ORAL_CAPSULE | Freq: Two times a day (BID) | ORAL | 3 refills | Status: DC

## 2021-05-03 MED ORDER — OLMESARTAN MEDOXOMIL 40 MG PO TABS: 40.0000 mg | ORAL_TABLET | Freq: Every day | ORAL | 1 refills | Status: DC

## 2021-05-03 MED ORDER — ALBUTEROL SULFATE HFA 108 (90 BASE) MCG/ACT IN AERS: INHALATION_SPRAY | RESPIRATORY_TRACT | 1 refills | Status: DC

## 2021-05-03 MED ORDER — AMOXICILLIN 875 MG PO TABS: 875.0000 mg | ORAL_TABLET | Freq: Two times a day (BID) | ORAL | 0 refills | Status: DC

## 2021-05-03 NOTE — Progress Notes (Signed)
Subjective:     Patient ID: [name], female    DOB: [birth year], 88 y.o.   MRN: [id number]  Chief Complaint  Patient presents with   Transfer of Care    Would like to see if she can get a new wheel chair    Neck Pain   Possible UTI    Memory issues      Neck Pain   TOC here w/daughter [name]  Giant cell ateritis-on Actemera and pred.-rheum--Dr. Gerilyn Nestle in [place].  Dry mouth at [facility]-mouth breather.  Gets labs done there. A lot of neck pain for 8-12 months.  Heat helps.  Has had x-ray, etc.  Can't stand vibration of tens. C-collar helped some Eyelashes turn in-declines surgery-so uses bandaid to hold lower R lid down during daytime Asthma-Breathing worse-[facility]-Dr. Melvyn Novas in past but PCP took over rx. Occ uses albuterol Gets uti freq-shots of abx at times.  Last UTI 1/10.  Seeing urol and on prophy keflex.  Now, past 2 days, some dysuria.  Wheel chair is breaking so would like new on.-xport   Has rolling walker that she uses o/w.  7.  Valium 1/2 bid-keeps anxiety manageable.  No SI  There are no preventive care reminders to display for this patient.  Past Medical History:  Diagnosis Date   Abdominal bloating    Abnormal LFTs (liver function tests)    Ankle fracture, bimalleolar, closed, right, initial encounter    Anxiety disorder    Arthritis    Asthma    Bimalleolar ankle fracture, right, closed, initial encounter 01/06/2019   Depression    Diverticulosis    Edema    Fatty liver    Fibromyalgia    GERD (gastroesophageal reflux disease)    Giant cell arteritis (HCC)    No biopsy secondary to steroid use   Hx of adenomatous colonic polyps    Hyperlipidemia    Hypertension    IBS (irritable bowel syndrome)    Lumbar radiculitis    Osteoporosis 09/2017   T score -2.6 left femoral neck   Peptic ulcer disease    Skin cancer    shoulder, leg, right eye (? squamous by resection)   Thoracic radiculitis    Thyroid nodule    "removed"   Urinary incontinence    Vertigo      Past Surgical History:  Procedure Laterality Date   ABDOMINAL HYSTERECTOMY     BSO   BACK SURGERY  2005   lower back   EYE SURGERY Right 2006   LIVER BIOPSY     fatty liver   NASAL SEPTUM SURGERY     ORIF ANKLE FRACTURE Right 01/11/2019   Procedure: OPEN REDUCTION INTERNAL FIXATION (ORIF) RIGHT BIMALLEOLAR ANKLE FRACTURE;  Surgeon: Leandrew Koyanagi, MD;  Location: [place];  Service: Orthopedics;  Laterality: Right;   right knee surgery  2006   arthroscopy   right leg skin cancer surgery  2014   right shoulder skin cancer excision  2012   SKIN CANCER EXCISION     THYROIDECTOMY      Outpatient Medications Prior to Visit  Medication Sig Dispense Refill   albuterol (VENTOLIN HFA) 108 (90 Base) MCG/ACT inhaler USE 2 INHALATIONS UP TO EVERY 4 HOURS IF CANNOT CATCH YOUR BREATH 68 g 0   alendronate (FOSAMAX) 70 MG tablet Take by mouth.     aspirin EC 81 MG tablet Take 1 tablet (81 mg total) by mouth 2 (two) times daily. 84 tablet 0  calcium-vitamin D (OSCAL WITH D) 500-200 MG-UNIT tablet Take 1 tablet by mouth 3 (three) times daily. 90 tablet 6   carbamide peroxide (DEBROX) 6.5 % OTIC solution Use once a month to keep cerumen form building up. 15 mL 0   cephALEXin (KEFLEX) 250 MG capsule Take 250 mg by mouth at bedtime.     Cholecalciferol (VITAMIN D3) 20 MCG (800 UNIT) TABS Take 1 tablet by mouth daily. 90 tablet 3   Cyanocobalamin (VITAMIN B-12) 5000 MCG SUBL Place 0.0001 tablets (0.5 mcg total) under the tongue daily. Place 2500MCG under the tongue daily. 1 tablet 11   diazepam (VALIUM) 5 MG tablet 1/2 tab PO in the morning and 1 tab QHS PRN. 135 tablet 1   estradiol (ESTRACE) 0.1 MG/GM vaginal cream Place vaginally.     FLUoxetine (PROZAC) 20 MG capsule TAKE 3 CAPSULES DAILY 90 capsule 0   neomycin-colistin-hydrocortisone-thonzonium (CORTISPORIN-TC) 3.05-18-08-0.5 MG/ML OTIC suspension Place 3 drops into the right ear 4 (four) times daily. 10 mL 0   olmesartan  (BENICAR) 40 MG tablet Take 1 tablet (40 mg total) by mouth daily. 30 tablet 0   ondansetron (ZOFRAN ODT) 8 MG disintegrating tablet Take 1 tablet (8 mg total) by mouth every 8 (eight) hours as needed for nausea or vomiting. 30 tablet 0   predniSONE (DELTASONE) 5 MG tablet Take 10 mg by mouth daily.     Probiotic Product (PROBIOTIC PO) Take by mouth daily.     RESTASIS 0.05 % ophthalmic emulsion 1 drop 2 (two) times daily.     tocilizumab (ACTEMRA) 400 MG/20ML SOLN injection      tocilizumab 4 mg/kg in sodium chloride 0.9 % Inject 280 mg into the vein.     traMADol (ULTRAM) 50 MG tablet TAKE ONE TABLET BY MOUTH EVERY 6 HOURS AS NEEDED FOR MODERATE PAIN 40 tablet 0   triamcinolone (KENALOG) 0.025 % ointment Apply a very thin layer to affected area every 2 days. 30 g 0   budesonide-formoterol (SYMBICORT) 80-4.5 MCG/ACT inhaler USE 2 INHALATIONS TWICE A DAY 30.6 g 12   omega-3 acid ethyl esters (LOVAZA) 1 g capsule Take 2 capsules (2 g total) by mouth 2 (two) times daily. 360 capsule 2   cephALEXin (KEFLEX) 500 MG capsule Take 1 capsule (500 mg total) by mouth 2 (two) times daily. 14 capsule 0   No facility-administered medications prior to visit.    Allergies  Allergen Reactions   Atorvastatin Other (See Comments)    Severe headache   Oxycodone Itching    hallucinations   Statins    Advair Diskus [Fluticasone-Salmeterol] Other (See Comments)    Mouth sores   Sulfonamide Derivatives Rash   YQI:[id number]/[id number] except as noted in HPI       Objective:     BP (!) 150/80    Pulse 75    Temp 98.6 F (37 C) (Temporal)    Ht 5\' 3"  (1.6 m)    Wt 195 lb 4 oz (88.6 kg)    SpO2 95%    BMI 34.59 kg/m  Wt Readings from Last 3 Encounters:  05/03/21 195 lb 4 oz (88.6 kg)  03/27/21 198 lb (89.8 kg)  01/17/21 196 lb (88.9 kg)        Gen: WDWN NAD OWF in W/C HEENT: NCAT, conjunctiva not injected, sclera nonicteric. HOH NECK:  supple, no thyromegaly, no nodes, no carotid  bruits CARDIAC: RRR, S1S2+, no murmur. DP 2+B LUNGS: CTAB. No wheezes but distant ABDOMEN:  BS+, soft, mildly tender  diffusely. No HSM, no masses EXT:  1+ edema MSK: arthritic hands/wrists NEURO: A&O x3.  CN II-XII intact.  PSYCH: normal mood. Good eye contact  Results for orders placed or performed in visit on 05/03/21  POCT urinalysis dipstick  Result Value Ref Range   Color, UA YELLOW    Clarity, UA CLEAR    Glucose, UA Negative Negative   Bilirubin, UA NEGATIVE    Ketones, UA NEGATIVE    Spec Grav, UA 1.025 1.010 - 1.025   Blood, UA NEGATIVE    pH, UA 5.5 5.0 - 8.0   Protein, UA Positive (A) Negative   Urobilinogen, UA 0.2 0.2 or 1.0 E.U./dL   Nitrite, UA NEGATIVE    Leukocytes, UA Negative Negative   Appearance     Odor       Problem List Items Addressed This Visit       Cardiovascular and Mediastinum   Giant cell arteritis (HCC) - Primary   Relevant Medications   omega-3 acid ethyl esters (LOVAZA) 1 g capsule     Respiratory   Asthma, moderate persistent     Other   Hypertriglyceridemia   Relevant Medications   omega-3 acid ethyl esters (LOVAZA) 1 g capsule   Polymyalgia rheumatica (HCC)   Relevant Medications   Misc. Devices [facility]) MISC   Depression with anxiety   Other Visit Diagnoses     Dysuria       Relevant Orders   POCT urinalysis dipstick (Completed)   Urine Culture   Cervicalgia         1.  Giant cell arteritis/PMR-stable on medications.  Followed by rheumatologist.  Will review chart for labs.  Continue meds.  Will order lightwt w/t for outings. 2.  Cervicalgia-probably some degenerative disc disease.  Add turmeric.  Already on other medications. 3.  Asthma-not well controlled.  Will increase Symbicort.  Also use albuterol every 6 hours and see if it helps. 4.  Dysuria-frequent UTIs.  Patient is on suppressive therapy.  However, she is starting to become symptomatic.  UA is nonrevealing, but given how sick she usually gets, will treat  with amoxicillin and await culture. 5.  Depression/anxiety-stable on medications.  Continue 6.  Hypertriglyceridemia-needing a refill on fish oil.  They are unsure when the last labs were done. 7.-Multiple other medical problems.  Fairly stable.  Meds need to be renewed.  Follow-up in 3 months      Assessment & Plan:   Problem List Items Addressed This Visit       Cardiovascular and Mediastinum   Giant cell arteritis ([place]) - Primary   Relevant Medications   omega-3 acid ethyl esters (LOVAZA) 1 g capsule     Respiratory   Asthma, moderate persistent     Other   Hypertriglyceridemia   Relevant Medications   omega-3 acid ethyl esters (LOVAZA) 1 g capsule   Polymyalgia rheumatica (HCC)   Relevant Medications   Misc. Devices [facility]) MISC   Depression with anxiety   Other Visit Diagnoses     Dysuria       Relevant Orders   POCT urinalysis dipstick (Completed)   Urine Culture   Cervicalgia           Meds ordered this encounter  Medications   omega-3 acid ethyl esters (LOVAZA) 1 g capsule    Sig: Take 2 capsules (2 g total) by mouth 2 (two) times daily.    Dispense:  360 capsule    Refill:  3   amoxicillin (  AMOXIL) 875 MG tablet    Sig: Take 1 tablet (875 mg total) by mouth 2 (two) times daily.    Dispense:  14 tablet    Refill:  0   Misc. Devices [facility]) MISC    Sig: 1 each by Does not apply route once for 1 dose.    Dispense:  1 each    Refill:  0    Lightweight w/c    Wellington Hampshire, MD

## 2021-05-03 NOTE — Patient Instructions (Signed)
It was very nice to see you today!  Tumeric, salon pas, heat   PLEASE NOTE:  If you had any lab tests please let us know if you have not heard back within a few days. You may see your results on MyChart before we have a chance to review them but we will give you a call once they are reviewed by [place]. If we ordered any referrals today, please let us know if you have not heard from their office within the next week.   Please try these tips to maintain a healthy lifestyle:  Eat most of your calories during the day when you are active. Eliminate processed foods including packaged sweets (pies, cakes, cookies), reduce intake of potatoes, white bread, white pasta, and white rice. Look for whole grain options, oat flour or almond flour.  Each meal should contain half fruits/vegetables, one quarter protein, and one quarter carbs (no bigger than a computer mouse).  Cut down on sweet beverages. This includes juice, soda, and sweet tea. Also watch fruit intake, though this is a healthier sweet option, it still contains natural sugar! Limit to 3 servings daily.  Drink at least 1 glass of water with each meal and aim for at least 8 glasses per day  Exercise at least 150 minutes every week.

## 2021-05-04 LAB — URINE CULTURE
MICRO NUMBER:: 13018261
SPECIMEN QUALITY:: ADEQUATE

## 2021-05-08 ENCOUNTER — Telehealth: Payer: Self-pay | Admitting: Family Medicine

## 2021-05-08 NOTE — Telephone Encounter (Signed)
Left message for patient to schedule Annual Wellness Visit.  Please schedule (telephone/video call) with Nurse Health Advisor Tina Betterson, RN at [facility]. Please call [phone] ask for Kathy 

## 2021-05-22 DIAGNOSIS — M316 Other giant cell arteritis: Secondary | ICD-10-CM | POA: Diagnosis not present

## 2021-05-23 ENCOUNTER — Other Ambulatory Visit: Payer: Self-pay | Admitting: Family Medicine

## 2021-06-11 ENCOUNTER — Other Ambulatory Visit: Payer: Self-pay | Admitting: Family Medicine

## 2021-06-11 NOTE — Telephone Encounter (Signed)
Patients daughter called stating [facility] diazepam (VALIUM) 5 MG tablet  on back order- stated if Dr Cherlynn Kaiser [facility]  ?

## 2021-06-12 MED ORDER — DIAZEPAM 5 MG PO TABS: ORAL_TABLET | ORAL | 1 refills | Status: DC

## 2021-06-19 DIAGNOSIS — M316 Other giant cell arteritis: Secondary | ICD-10-CM | POA: Diagnosis not present

## 2021-06-19 DIAGNOSIS — M2550 Pain in unspecified joint: Secondary | ICD-10-CM | POA: Diagnosis not present

## 2021-06-19 DIAGNOSIS — Z79899 Other long term (current) drug therapy: Secondary | ICD-10-CM | POA: Diagnosis not present

## 2021-06-26 DIAGNOSIS — N3946 Mixed incontinence: Secondary | ICD-10-CM | POA: Diagnosis not present

## 2021-06-26 DIAGNOSIS — N3020 Other chronic cystitis without hematuria: Secondary | ICD-10-CM | POA: Diagnosis not present

## 2021-06-26 DIAGNOSIS — N952 Postmenopausal atrophic vaginitis: Secondary | ICD-10-CM | POA: Diagnosis not present

## 2021-06-28 ENCOUNTER — Ambulatory Visit (INDEPENDENT_AMBULATORY_CARE_PROVIDER_SITE_OTHER): Payer: Medicare Other

## 2021-06-28 DIAGNOSIS — Z0000 Encounter for general adult medical examination without abnormal findings: Secondary | ICD-10-CM | POA: Diagnosis not present

## 2021-06-28 NOTE — Progress Notes (Addendum)
Virtual Visit via Telephone Note ? ?I connected with  [name] on 06/29/21 at 11:30 AM EDT by telephone and verified that I am speaking with the correct person using two identifiers. ? ?Medicare Annual Wellness visit completed telephonically due to Covid-19 pandemic.  ? ?Persons participating in this call: This Health Coach and this patient.  ? ?Location: ?Patient: home ?Provider: office  ?  ?I discussed the limitations, risks, security and privacy concerns of performing an evaluation and management service by telephone and the availability of in person appointments. The patient expressed understanding and agreed to proceed. ? ?Unable to perform video visit due to video visit attempted and failed and/or patient does not have video capability.  ? ?Some vital signs may be absent or patient reported.  ? ?Willette Brace, LPN ? ? ?Subjective:  ? [name] is a 88 y.o. female who presents for Medicare Annual (Subsequent) preventive examination. ? ?Review of Systems    ? ?Cardiac Risk Factors include: advanced age (>34mn, >>39women) ? ?   ?Objective:  ?  ?There were no vitals filed for this visit. ?There is no height or weight on file to calculate BMI. ? ? ?  06/28/2021  ? 11:32 AM 05/10/2020  ? 10:36 AM 03/14/2020  ?  1:48 PM 01/11/2019  ? 11:27 AM 01/06/2019  ?  3:38 PM 01/05/2019  ? 10:29 AM 07/15/2017  ? 11:34 AM  ?Advanced Directives  ?Does Patient Have a Medical Advance Directive? No Yes Yes Yes No Yes No  ?Type of ACorporate treasurerof [place] will [place] will Healthcare Power of [place]  ?Does patient want to make changes to medical advance directive?    No - Patient declined     ?Copy of [place] Chart?  No - copy requested  No - copy requested     ?Would patient like information on creating a medical advance directive? No - Patient declined   No - Patient declined No - Patient declined  Yes  (MAU/Ambulatory/Procedural Areas - Information given)  ? ? ?Current Medications (verified) ?Outpatient Encounter Medications as of 06/28/2021  ?Medication Sig  ? albuterol (VENTOLIN HFA) 108 (90 Base) MCG/ACT inhaler USE 2 INHALATIONS UP TO EVERY 4 HOURS IF CANNOT CATCH YOUR BREATH  ? alendronate (FOSAMAX) 70 MG tablet Take 1 tablet (70 mg total) by mouth once a week.  ? aspirin EC 81 MG tablet Take 1 tablet (81 mg total) by mouth 2 (two) times daily.  ? budesonide-formoterol (SYMBICORT) 160-4.5 MCG/ACT inhaler Inhale 2 puffs into the lungs 2 (two) times daily.  ? calcium-vitamin D (OSCAL WITH D) 500-200 MG-UNIT tablet Take 1 tablet by mouth 3 (three) times daily.  ? carbamide peroxide (DEBROX) 6.5 % OTIC solution Use once a month to keep cerumen form building up.  ? cephALEXin (KEFLEX) 250 MG capsule Take 250 mg by mouth at bedtime.  ? Cholecalciferol (VITAMIN D3) 20 MCG (800 UNIT) TABS Take 1 tablet by mouth daily.  ? Cyanocobalamin (VITAMIN B-12) 5000 MCG SUBL Place 0.0001 tablets (0.5 mcg total) under the tongue daily. Place 2500MCG under the tongue daily.  ? diazepam (VALIUM) 5 MG tablet 1/2 tab PO in the morning and 1 tab QHS PRN.  ? estradiol (ESTRACE) 0.1 MG/GM vaginal cream Place vaginally.  ? FLUoxetine (PROZAC) 20 MG capsule Take 3 capsules (60 mg total) by mouth daily. 3 caps daily  ? neomycin-colistin-hydrocortisone-thonzonium (CORTISPORIN-TC) 3.05-18-08-0.5 MG/ML OTIC  suspension Place 3 drops into the right ear 4 (four) times daily.  ? olmesartan (BENICAR) 40 MG tablet Take 1 tablet (40 mg total) by mouth daily.  ? omega-3 acid ethyl esters (LOVAZA) 1 g capsule Take 2 capsules (2 g total) by mouth 2 (two) times daily.  ? ondansetron (ZOFRAN ODT) 8 MG disintegrating tablet Take 1 tablet (8 mg total) by mouth every 8 (eight) hours as needed for nausea or vomiting.  ? predniSONE (DELTASONE) 5 MG tablet Take 10 mg by mouth daily.  ? Probiotic Product (PROBIOTIC PO) Take by mouth daily.  ? RESTASIS 0.05 %  ophthalmic emulsion 1 drop 2 (two) times daily.  ? tocilizumab (ACTEMRA) 400 MG/20ML SOLN injection   ? tocilizumab 4 mg/kg in sodium chloride 0.9 % Inject 280 mg into the vein.  ? traMADol (ULTRAM) 50 MG tablet TAKE ONE TABLET BY MOUTH EVERY 6 HOURS AS NEEDED FOR MODERATE PAIN  ? triamcinolone (KENALOG) 0.025 % ointment Apply a very thin layer to affected area every 2 days. (Patient not taking: Reported on 06/28/2021)  ? [DISCONTINUED] amoxicillin (AMOXIL) 875 MG tablet Take 1 tablet (875 mg total) by mouth 2 (two) times daily.  ? ?No facility-administered encounter medications on file as of 06/28/2021.  ? ? ?Allergies (verified) ?Atorvastatin, Oxycodone, Statins, Advair diskus [fluticasone-salmeterol], and Sulfonamide derivatives  ? ?History: ?Past Medical History:  ?Diagnosis Date  ? Abdominal bloating   ? Abnormal LFTs (liver function tests)   ? Ankle fracture, bimalleolar, closed, right, initial encounter   ? Anxiety disorder   ? Arthritis   ? Asthma   ? Bimalleolar ankle fracture, right, closed, initial encounter 01/06/2019  ? Depression   ? Diverticulosis   ? Edema   ? Fatty liver   ? Fibromyalgia   ? GERD (gastroesophageal reflux disease)   ? Giant cell arteritis ([place])   ? No biopsy secondary to steroid use  ? Hx of adenomatous colonic polyps   ? Hyperlipidemia   ? Hypertension   ? IBS (irritable bowel syndrome)   ? Lumbar radiculitis   ? Osteoporosis 09/2017  ? T score -2.6 left femoral neck  ? Peptic ulcer disease   ? Skin cancer   ? shoulder, leg, right eye (? squamous by resection)  ? Thoracic radiculitis   ? Thyroid nodule   ? "removed"  ? Urinary incontinence   ? Vertigo   ? ?Past Surgical History:  ?Procedure Laterality Date  ? ABDOMINAL HYSTERECTOMY    ? BSO  ? BACK SURGERY  2005  ? lower back  ? EYE SURGERY Right 2006  ? LIVER BIOPSY    ? fatty liver  ? NASAL SEPTUM SURGERY    ? ORIF ANKLE FRACTURE Right 01/11/2019  ? Procedure: OPEN REDUCTION INTERNAL FIXATION (ORIF) RIGHT BIMALLEOLAR ANKLE FRACTURE;   Surgeon: Leandrew Koyanagi, MD;  Location: [place];  Service: Orthopedics;  Laterality: Right;  ? right knee surgery  2006  ? arthroscopy  ? right leg skin cancer surgery  2014  ? right shoulder skin cancer excision  2012  ? SKIN CANCER EXCISION    ? THYROIDECTOMY    ? ?Family History  ?Problem Relation Age of Onset  ? Colon polyps Maternal Aunt   ? Diabetes Father   ? Heart disease Father   ? Heart disease Mother   ? Lung disease Mother   ? COPD Mother   ? Lung cancer Brother   ? Cancer Brother   ?  lung  ? COPD Brother   ? Stroke Brother   ? Lung cancer Sister   ? COPD Sister   ? Kidney disease Other   ?     niece-mat side  ? Arthritis Other   ? Diabetes Son   ? Arthritis Daughter   ? Colon cancer Neg Hx   ? ?Social History  ? ?Socioeconomic History  ? Marital status: Widowed  ?  Spouse name: Not on file  ? Number of children: 3  ? Years of education: Not on file  ? Highest education level: Not on file  ?Occupational History  ? Occupation: Retired Water engineer  ?Tobacco Use  ? Smoking status: Never  ? Smokeless tobacco: Never  ?Vaping Use  ? Vaping Use: Never used  ?Substance and Sexual Activity  ? Alcohol use: No  ? Drug use: No  ? Sexual activity: Not Currently  ?  Comment: intercourser age 72, less than 103 secxual partners  ?Other Topics Concern  ? Not on file  ?Social History Narrative  ?   ? children [name] and Anita-pt lives w/[place], but others are neighbors.  ? 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  ? Patient takes a daily vitamin  ? She wears her seat belt. Exercises greater than 3 times a week.  ? Wears 2 hearing aids. No dentures.  ? "Sometimes "requires assistive device for walking, either a walker or wheelchair.  ? There is a smoke detector in her home. There are firearms in her home. She feels safe in her relationships.  ? Right handed  ? ?Social Determinants of Health  ? ?Financial Resource Strain: Low Risk   ? Difficulty of Paying Living Expenses: Not hard at all  ?Food  Insecurity: No Food Insecurity  ? Worried About Charity fundraiser in the Last Year: Never true  ? Ran Out of Food in the Last Year: Never true  ?Transportation Needs: No Transportation Needs  ? Lack of Transporta

## 2021-06-28 NOTE — Patient Instructions (Signed)
Ms. Tesoro , ?Thank you for taking time to come for your Medicare Wellness Visit. I appreciate your ongoing commitment to your health goals. Please review the following plan we discussed and let me know if I can assist you in the future.  ? ?Screening recommendations/referrals: ?Colonoscopy: no longer required ?Mammogram: no longer required ?Bone Density: Done 06/08/20 repeat every 2 year   ?Recommended yearly ophthalmology/optometry visit for glaucoma screening and checkup ?Recommended yearly dental visit for hygiene and checkup ? ?Vaccinations: ?Influenza vaccine: Done 01/17/21 repeat every year  ?Pneumococcal vaccine: Up to date ?Tdap vaccine: Done 04/11/15 repeat every year  ?Shingles vaccine: Done 6/25 & 01/01/18   ?Covid-19:Completed 8/9, 11/15/19 ? ?Advanced directives: Advance directive discussed with you today. Even though you declined this today please call our office should you change your mind and we can give you the proper paperwork for you to fill out. ? ?Conditions/risks identified: None at this time  ? ?Next appointment: Follow up in one year for your annual wellness visit  ? ? ?Preventive Care 49 Years and Older, Female ?Preventive care refers to lifestyle choices and visits with your health care provider that can promote health and wellness. ?What does preventive care include? ?A yearly physical exam. This is also called an annual well check. ?Dental exams once or twice a year. ?Routine eye exams. Ask your health care provider how often you should have your eyes checked. ?Personal lifestyle choices, including: ?Daily care of your teeth and gums. ?Regular physical activity. ?Eating a healthy diet. ?Avoiding tobacco and drug use. ?Limiting alcohol use. ?Practicing safe sex. ?Taking low-dose aspirin every day. ?Taking vitamin and mineral supplements as recommended by your health care provider. ?What happens during an annual well check? ?The services and screenings done by your health care provider during  your annual well check will depend on your age, overall health, lifestyle risk factors, and family history of disease. ?Counseling  ?Your health care provider may ask you questions about your: ?Alcohol use. ?Tobacco use. ?Drug use. ?Emotional well-being. ?Home and relationship well-being. ?Sexual activity. ?Eating habits. ?History of falls. ?Memory and ability to understand (cognition). ?Work and work Statistician. ?Reproductive health. ?Screening  ?You may have the following tests or measurements: ?Height, weight, and BMI. ?Blood pressure. ?Lipid and cholesterol levels. These may be checked every 5 years, or more frequently if you are over 51 years old. ?Skin check. ?Lung cancer screening. You may have this screening every year starting at age 36 if you have a 30-pack-year history of smoking and currently smoke or have quit within the past 15 years. ?Fecal occult blood test (FOBT) of the stool. You may have this test every year starting at age 12. ?Flexible sigmoidoscopy or colonoscopy. You may have a sigmoidoscopy every 5 years or a colonoscopy every 10 years starting at age 4. ?Hepatitis C blood test. ?Hepatitis B blood test. ?Sexually transmitted disease (STD) testing. ?Diabetes screening. This is done by checking your blood sugar (glucose) after you have not eaten for a while (fasting). You may have this done every 1-3 years. ?Bone density scan. This is done to screen for osteoporosis. You may have this done starting at age 2. ?Mammogram. This may be done every 1-2 years. Talk to your health care provider about how often you should have regular mammograms. ?Talk with your health care provider about your test results, treatment options, and if necessary, the need for more tests. ?Vaccines  ?Your health care provider may recommend certain vaccines, such as: ?Influenza vaccine. This  is recommended every year. ?Tetanus, diphtheria, and acellular pertussis (Tdap, Td) vaccine. You may need a Td booster every 10  years. ?Zoster vaccine. You may need this after age 46. ?Pneumococcal 13-valent conjugate (PCV13) vaccine. One dose is recommended after age 27. ?Pneumococcal polysaccharide (PPSV23) vaccine. One dose is recommended after age 77. ?Talk to your health care provider about which screenings and vaccines you need and how often you need them. ?This information is not intended to replace advice given to you by your health care provider. Make sure you discuss any questions you have with your health care provider. ?Document Released: 03/31/2015 Document Revised: 11/22/2015 Document Reviewed: 01/03/2015 ?Elsevier Interactive Patient Education ? 2017 [place]. ? ?Fall Prevention in the Home ?Falls can cause injuries. They can happen to people of all ages. There are many things you can do to make your home safe and to help prevent falls. ?What can I do on the outside of my home? ?Regularly fix the edges of walkways and driveways and fix any cracks. ?Remove anything that might make you trip as you walk through a door, such as a raised step or threshold. ?Trim any bushes or trees on the path to your home. ?Use bright outdoor lighting. ?Clear any walking paths of anything that might make someone trip, such as rocks or tools. ?Regularly check to see if handrails are loose or broken. Make sure that both sides of any steps have handrails. ?Any raised decks and porches should have guardrails on the edges. ?Have any leaves, snow, or ice cleared regularly. ?Use sand or salt on walking paths during winter. ?Clean up any spills in your garage right away. This includes oil or grease spills. ?What can I do in the bathroom? ?Use night lights. ?Install grab bars by the toilet and in the tub and shower. Do not use towel bars as grab bars. ?Use non-skid mats or decals in the tub or shower. ?If you need to sit down in the shower, use a plastic, non-slip stool. ?Keep the floor dry. Clean up any water that spills on the floor as soon as it  happens. ?Remove soap buildup in the tub or shower regularly. ?Attach bath mats securely with double-sided non-slip rug tape. ?Do not have throw rugs and other things on the floor that can make you trip. ?What can I do in the bedroom? ?Use night lights. ?Make sure that you have a light by your bed that is easy to reach. ?Do not use any sheets or blankets that are too big for your bed. They should not hang down onto the floor. ?Have a firm chair that has side arms. You can use this for support while you get dressed. ?Do not have throw rugs and other things on the floor that can make you trip. ?What can I do in the kitchen? ?Clean up any spills right away. ?Avoid walking on wet floors. ?Keep items that you use a lot in easy-to-reach places. ?If you need to reach something above you, use a strong step stool that has a grab bar. ?Keep electrical cords out of the way. ?Do not use floor polish or wax that makes floors slippery. If you must use wax, use non-skid floor wax. ?Do not have throw rugs and other things on the floor that can make you trip. ?What can I do with my stairs? ?Do not leave any items on the stairs. ?Make sure that there are handrails on both sides of the stairs and use them. Fix handrails that  are broken or loose. Make sure that handrails are as long as the stairways. ?Check any carpeting to make sure that it is firmly attached to the stairs. Fix any carpet that is loose or worn. ?Avoid having throw rugs at the top or bottom of the stairs. If you do have throw rugs, attach them to the floor with carpet tape. ?Make sure that you have a light switch at the top of the stairs and the bottom of the stairs. If you do not have them, ask someone to add them for you. ?What else can I do to help prevent falls? ?Wear shoes that: ?Do not have high heels. ?Have rubber bottoms. ?Are comfortable and fit you well. ?Are closed at the toe. Do not wear sandals. ?If you use a stepladder: ?Make sure that it is fully opened.  Do not climb a closed stepladder. ?Make sure that both sides of the stepladder are locked into place. ?Ask someone to hold it for you, if possible. ?Clearly mark and make sure that you can see: ?Any grab bar

## 2021-07-17 DIAGNOSIS — M316 Other giant cell arteritis: Secondary | ICD-10-CM | POA: Diagnosis not present

## 2021-07-24 ENCOUNTER — Telehealth: Payer: Self-pay | Admitting: Family Medicine

## 2021-07-24 NOTE — Telephone Encounter (Signed)
Pt's daughter states pt has been "going down hill" for the past few days.  ?She states confusion has been increasing. ?Within the past 24 hours, the decline has accelerated and is concerned this may be a stroke.  ?States she gave the pt "3 baby asprins" to prevent stroke. ?

## 2021-07-24 NOTE — Telephone Encounter (Signed)
Please advise, PCP out of the office. ?

## 2021-07-25 ENCOUNTER — Encounter: Payer: Self-pay | Admitting: Family

## 2021-07-25 ENCOUNTER — Telehealth: Payer: Self-pay

## 2021-07-25 ENCOUNTER — Ambulatory Visit (INDEPENDENT_AMBULATORY_CARE_PROVIDER_SITE_OTHER): Payer: Medicare Other | Admitting: Family

## 2021-07-25 VITALS — BP 159/85 | HR 81 | Temp 98.0°F | Ht 63.0 in | Wt 195.0 lb

## 2021-07-25 DIAGNOSIS — R110 Nausea: Secondary | ICD-10-CM | POA: Diagnosis not present

## 2021-07-25 DIAGNOSIS — M542 Cervicalgia: Secondary | ICD-10-CM

## 2021-07-25 DIAGNOSIS — R410 Disorientation, unspecified: Secondary | ICD-10-CM

## 2021-07-25 LAB — POCT URINALYSIS DIPSTICK
Bilirubin, UA: NEGATIVE
Blood, UA: NEGATIVE
Glucose, UA: NEGATIVE
Ketones, UA: NEGATIVE
Leukocytes, UA: NEGATIVE
Nitrite, UA: NEGATIVE
Protein, UA: POSITIVE — AB
Spec Grav, UA: 1.015 (ref 1.010–1.025)
Urobilinogen, UA: 0.2 E.U./dL
pH, UA: 6 (ref 5.0–8.0)

## 2021-07-25 NOTE — Patient Instructions (Addendum)
It was very nice to see you today! ? ?For your neck pain, continue the prednisone, but let Rheumatology know if you think the confusion may be from reducing her dose. ?Try '1000mg'$  of Tylenol every 8 hours for the next day or 2 to see if this helps your neck pain. ? ?Try over the counter Gas X (simethicone) pills for your gas pain - follow directions on the box.  ? ?You have been prescribed Zofran (Ondansetron) for nausea in the past, let me know if you are out of this when you get home and I will send more. ? ? ? ? ?PLEASE NOTE: ? ?If you had any lab tests please let us know if you have not heard back within a few days. You may see your results on MyChart before we have a chance to review them but we will give you a call once they are reviewed by [place]. If we ordered any referrals today, please let us know if you have not heard from their office within the next week.  ? ?

## 2021-07-25 NOTE — Telephone Encounter (Signed)
Pt daughter stated pt last BM was this morning and She takes OTC colace.  ?

## 2021-07-25 NOTE — Telephone Encounter (Signed)
-----   Message from Jeanie Sewer, NP sent at 07/25/2021  1:05 PM EDT ----- ?Regarding: pt seen today - constipation ?pt daughter said her brother gave her cabbage yesterday for her constipation - I did not ask about this, but it can be causing her nausea - ask when was her last bowel movement?  ?And does she take anything daily for this? I don't see any laxatives on her profile. ? ?Let me know, thanks! ? ? ? ?

## 2021-07-25 NOTE — Progress Notes (Signed)
? ?Subjective:  ? ? ? Patient ID: [name], female    DOB: 1929/11/26, 88 y.o.   MRN: [id number] ? ?Chief Complaint  ?Patient presents with  ? Memory Loss  ?  Pt daughter states pt has been confused for 48 or more so.   ? Neck Pain  ?  Pt c/o neck pain which has been going on for a while. Arthritis in the past.   ? Nausea  ?  Pt c/o nausea since this morning. Has not tried anything.   ? ? ?HPI: ?Acute confusion:  pt dtr present for visit and states her mother started acting confused 2 days ago, no hallucinations, but today is doing fine. Pt denies any urinary sx or fever, no cough, no change in bowels. ?Nausea:  started just this am, no vomiting, pt taking Prilosec qd, denies pain other than bloating, gassy feeling all over after eating cabbage yesterday. Has hx of off and on nausea, has not taken any antiemetic meds. ?Cervicalgia:  chronic problem, sees Rheumatology. Dtr reports pt on higher doses of prednisone for this but then pt complained of "not feeling right" so they have gradually decreased the dose but her neck pain has worsened.  ? ?Assessment & Plan:  ? ?Problem List Items Addressed This Visit   ?None ?Visit Diagnoses   ? ? Acute confusion    -  Primary  ? UA is negative today. Denies any other s/s of infection. Dtr states pt is fine today, no s/s of confusion so far, but she is questioning if it may be related to adjusting her prednisone that she is taking qd for her neck pain, advised this is a possibility, and they need to discuss with her Rheumatologist. ? ?Relevant Orders  ? POCT Urinalysis Dipstick (Completed)  ? Nausea in adult    - ?pt woke up with nausea, no vomiting, has had in past, BP a little high as pt does not take her BP meds until around this time.  Reports having gas & bloating in abd as she ate a lot of cabbage yesterday to get her bowels to move. Advised pt & dtr constipation can cause nausea also, advised on generic Miralax qd to help bowels move along with 2L water intake qd.  ?   ?Cervicalgia    - ?Chronic - per pt dtr, she has been adjusting prednisone dose to help with pain, titrating up to '20mg'$ , but pt did not feel good on this dose, so she has been slowly titrating back down, currently taking 12.'5mg'$  qd, but pt pain is bad again, wearing a neck pillow currently, and reports worsening pain radiating up the back of her head when she extends her neck. Advised on discussing prednisone and pain with her Rheumatologist, but she can try extra strength Tylenol, 2 pills tid for next few days to see if this helps any.  ? ?  ? ?Outpatient Medications Prior to Visit  ?Medication Sig Dispense Refill  ? albuterol (VENTOLIN HFA) 108 (90 Base) MCG/ACT inhaler USE 2 INHALATIONS UP TO EVERY 4 HOURS IF CANNOT CATCH YOUR BREATH 18 g 1  ? alendronate (FOSAMAX) 70 MG tablet Take 1 tablet (70 mg total) by mouth once a week. 12 tablet 3  ? aspirin EC 81 MG tablet Take 1 tablet (81 mg total) by mouth 2 (two) times daily. 84 tablet 0  ? budesonide-formoterol (SYMBICORT) 160-4.5 MCG/ACT inhaler Inhale 2 puffs into the lungs 2 (two) times daily. 3 each 3  ? calcium-vitamin D (  OSCAL WITH D) 500-200 MG-UNIT tablet Take 1 tablet by mouth 3 (three) times daily. 90 tablet 6  ? carbamide peroxide (DEBROX) 6.5 % OTIC solution Use once a month to keep cerumen form building up. 15 mL 0  ? cephALEXin (KEFLEX) 250 MG capsule Take 250 mg by mouth at bedtime.    ? Cholecalciferol (VITAMIN D3) 20 MCG (800 UNIT) TABS Take 1 tablet by mouth daily. 90 tablet 3  ? Cyanocobalamin (VITAMIN B-12) 5000 MCG SUBL Place 0.0001 tablets (0.5 mcg total) under the tongue daily. Place 2500MCG under the tongue daily. 1 tablet 11  ? diazepam (VALIUM) 5 MG tablet 1/2 tab PO in the morning and 1 tab QHS PRN. 135 tablet 1  ? estradiol (ESTRACE) 0.1 MG/GM vaginal cream Place vaginally.    ? FLUoxetine (PROZAC) 20 MG capsule Take 3 capsules (60 mg total) by mouth daily. 3 caps daily 270 capsule 1  ? neomycin-colistin-hydrocortisone-thonzonium  (CORTISPORIN-TC) 3.05-18-08-0.5 MG/ML OTIC suspension Place 3 drops into the right ear 4 (four) times daily. 10 mL 0  ? olmesartan (BENICAR) 40 MG tablet Take 1 tablet (40 mg total) by mouth daily. 90 tablet 1  ? omega-3 acid ethyl esters (LOVAZA) 1 g capsule Take 2 capsules (2 g total) by mouth 2 (two) times daily. 360 capsule 3  ? ondansetron (ZOFRAN ODT) 8 MG disintegrating tablet Take 1 tablet (8 mg total) by mouth every 8 (eight) hours as needed for nausea or vomiting. 30 tablet 0  ? predniSONE (DELTASONE) 5 MG tablet Take 10 mg by mouth daily.    ? Probiotic Product (PROBIOTIC PO) Take by mouth daily.    ? RESTASIS 0.05 % ophthalmic emulsion 1 drop 2 (two) times daily.    ? tocilizumab (ACTEMRA) 400 MG/20ML SOLN injection     ? tocilizumab 4 mg/kg in sodium chloride 0.9 % Inject 280 mg into the vein.    ? traMADol (ULTRAM) 50 MG tablet TAKE ONE TABLET BY MOUTH EVERY 6 HOURS AS NEEDED FOR MODERATE PAIN 40 tablet 0  ? triamcinolone (KENALOG) 0.025 % ointment Apply a very thin layer to affected area every 2 days. 30 g 0  ? ?No facility-administered medications prior to visit.  ? ? ?Past Medical History:  ?Diagnosis Date  ? Abdominal bloating   ? Abnormal LFTs (liver function tests)   ? Ankle fracture, bimalleolar, closed, right, initial encounter   ? Anxiety disorder   ? Arthritis   ? Asthma   ? Bimalleolar ankle fracture, right, closed, initial encounter 01/06/2019  ? Depression   ? Diverticulosis   ? Edema   ? Fatty liver   ? Fibromyalgia   ? GERD (gastroesophageal reflux disease)   ? Giant cell arteritis ([place])   ? No biopsy secondary to steroid use  ? Hx of adenomatous colonic polyps   ? Hyperlipidemia   ? Hypertension   ? IBS (irritable bowel syndrome)   ? Lumbar radiculitis   ? Osteoporosis 09/2017  ? T score -2.6 left femoral neck  ? Peptic ulcer disease   ? Skin cancer   ? shoulder, leg, right eye (? squamous by resection)  ? Thoracic radiculitis   ? Thyroid nodule   ? "removed"  ? Urinary incontinence   ?  Vertigo   ? ? ?Past Surgical History:  ?Procedure Laterality Date  ? ABDOMINAL HYSTERECTOMY    ? BSO  ? BACK SURGERY  2005  ? lower back  ? EYE SURGERY Right 2006  ? LIVER BIOPSY    ?  fatty liver  ? NASAL SEPTUM SURGERY    ? ORIF ANKLE FRACTURE Right 01/11/2019  ? Procedure: OPEN REDUCTION INTERNAL FIXATION (ORIF) RIGHT BIMALLEOLAR ANKLE FRACTURE;  Surgeon: Leandrew Koyanagi, MD;  Location: [facility];  Service: Orthopedics;  Laterality: Right;  ? right knee surgery  2006  ? arthroscopy  ? right leg skin cancer surgery  2014  ? right shoulder skin cancer excision  2012  ? SKIN CANCER EXCISION    ? THYROIDECTOMY    ? ? ?Allergies  ?Allergen Reactions  ? Atorvastatin Other (See Comments)  ?  Severe headache  ? Oxycodone Itching  ?  hallucinations  ? Statins   ? Advair Diskus [Fluticasone-Salmeterol] Other (See Comments)  ?  Mouth sores  ? Sulfonamide Derivatives Rash  ? ? ?   ?Objective:  ?  ?Physical Exam ?Vitals and nursing note reviewed.  ?Constitutional:   ?   General: She is not in acute distress. ?   Appearance: Normal appearance. She is not toxic-appearing.  ?Cardiovascular:  ?   Rate and Rhythm: Normal rate and regular rhythm.  ?Pulmonary:  ?   Effort: Pulmonary effort is normal.  ?   Breath sounds: Normal breath sounds.  ?Musculoskeletal:  ?   Cervical back: No edema or signs of trauma. Pain with movement present. Decreased range of motion.  ?Skin: ?   General: Skin is warm and dry.  ?Neurological:  ?   Mental Status: She is alert.  ?Psychiatric:     ?   Mood and Affect: Mood normal.     ?   Behavior: Behavior normal.  ? ? ?BP (!) 159/85 (BP Location: Left Arm, Patient Position: Sitting, Cuff Size: Large)   Pulse 81   Temp 98 ?F (36.7 ?C) (Temporal)   Ht '5\' 3"'$  (1.6 m)   Wt 195 lb (88.5 kg)   SpO2 94%   BMI 34.54 kg/m?  ?Wt Readings from Last 3 Encounters:  ?07/25/21 195 lb (88.5 kg)  ?05/03/21 195 lb 4 oz (88.6 kg)  ?03/27/21 198 lb (89.8 kg)  ? ?*Extra time (55mn) spent with patient today  which consisted of chart review of past office notes, labs, and medications,  discussing diagnoses, work up, treatment, answering questions, and documentation. ? ? ?HJeanie Sewer NP ? ?

## 2021-07-25 NOTE — Telephone Encounter (Signed)
Pt has an appt with [facility] today at 11:20am ? ? ?Patient ?Name: ?[name] ?SOR ?Gender: Female ?DOB: Mar 24, 1929 ?Age: 88 Y 1 M 16 D ?Return ?Phone ?Number: ?[id number] ?(Primary) ?Address: ?City/ ?State/ ?Zip: [place]  ? [address] ?Client [facility] - ?Client ?Presenter, [facility] at [facility] ?Contact Type Call ?Who Is Calling Patient / Member / Family / Caregiver ?Call Type Triage / Clinical ?Caller Name Gara Kroner ?Relationship To Patient Daughter ?Return Phone Number [phone] (Primary) ?Chief Complaint CONFUSION - new onset ?Reason for Call Symptomatic / Request for Health Information ?Initial Comment Caller states Raquel Sarna from [place] office, Pt states her ?mom might be having a stroke, she has not been ?herself, past 24 hours very confused also pain in ?her neck. ?Translation No ?Nurse Assessment ?Nurse: Dawna Part RN, Jarrett Soho Date/Time (Eastern Time): 07/24/2021 4:17:25 PM ?Confirm and document reason for call. If ?symptomatic, describe symptoms. ?---Caller reports patient has had neck pain x2 weeks, ?reports increased confusion x24 hrs. ?Does the patient have any new or worsening ?symptoms? ---Yes ?Will a triage be completed? ---Yes ?Related visit to physician within the last 2 weeks? ---No ?Does the PT have any chronic conditions? (i.e. ?diabetes, asthma, this includes High risk factors for ?pregnancy, etc.) ?---Yes ?List chronic conditions. ---fibromyalgia, asthma, PMR ?Is this a behavioral health or substance abuse call? ---No ?Guidelines ?Guideline Title Affirmed Question Affirmed Notes Nurse Date/Time (Eastern ?Time) ?Confusion - Delirium [1] Acting confused ?(e.g., disoriented, ?slurred speech) AND ?[2] brief (now gone) ?Dawna Part, RN, Jarrett Soho 07/24/2021 4:20:02 PM ?Disp. Time (Eastern ?Time) Disposition Final User ?07/24/2021 4:13:56 PM Send to Urgent Queue Ky Barban ?07/24/2021 4:24:46 PM See HCP within 4 Hours (or ?PCP triage) ?Yes Riddle, RN,  Jarrett Soho ?Caller Disagree/Comply Comply ?Caller Understands Yes ?PreDisposition Call Doctor ?Care Advice Given Per Guideline ?SEE HCP (OR PCP TRIAGE) WITHIN 4 HOURS: * OFFICE: If patient sounds stable and not seriously ill, consult PCP (or follow ?your office policy) to see if patient can be seen NOW in office. CALL BACK IF: * You become worse CARE ADVICE given per ?Confusion-Delirium (Adult) guideline. ?Comments ?User: Tedd Sias, RN Date/Time Eilene Ghazi Time): 07/24/2021 4:32:40 PM ?RN attempted to contact office per directive x2. RN unable to reach office, advised caller to take patient to UC or ?ED. Caller verbalized understanding. ?Referrals ?GO TO FACILITY UNDECIDED ?

## 2021-07-31 ENCOUNTER — Encounter: Payer: Self-pay | Admitting: Family Medicine

## 2021-07-31 ENCOUNTER — Ambulatory Visit (INDEPENDENT_AMBULATORY_CARE_PROVIDER_SITE_OTHER): Payer: Medicare Other | Admitting: Family Medicine

## 2021-07-31 VITALS — BP 150/70 | HR 78 | Temp 97.6°F | Ht 63.0 in | Wt 186.5 lb

## 2021-07-31 DIAGNOSIS — M542 Cervicalgia: Secondary | ICD-10-CM

## 2021-07-31 DIAGNOSIS — R110 Nausea: Secondary | ICD-10-CM

## 2021-07-31 DIAGNOSIS — G4452 New daily persistent headache (NDPH): Secondary | ICD-10-CM | POA: Diagnosis not present

## 2021-07-31 DIAGNOSIS — R5383 Other fatigue: Secondary | ICD-10-CM | POA: Diagnosis not present

## 2021-07-31 DIAGNOSIS — I10 Essential (primary) hypertension: Secondary | ICD-10-CM | POA: Diagnosis not present

## 2021-07-31 MED ORDER — ONDANSETRON 8 MG PO TBDP: 8.0000 mg | ORAL_TABLET | Freq: Three times a day (TID) | ORAL | 3 refills | Status: DC | PRN

## 2021-07-31 NOTE — Progress Notes (Signed)
? ?Subjective:  ? ? ? Patient ID: [name], female    DOB: 1930/01/04, 88 y.o.   MRN: [id number] ? ?Chief Complaint  ?Patient presents with  ? Follow-up  ?  3 month follow-up on HTN ?Memory issues ?Not fasting ? ?  ? Neck Pain  ?  Neck pain getting worse ?  ? ? ?HPI-here w/daughter-Anita ? HTN-bp's 150/70's.  No cp.   ?Neck and back of head still hurting-getting worse.  C-collar helps.  Has had x-rays in past.  Seeing rheum.  Had been on tramadol-helped some.  Pred being adjusted. Marland Kitchen   ?"Head feels" like it's big.  Feels like "in a hole".  Some nausea.   Head feels "like in circles" at times Per daughter, getting "far away look in eyes".  Just wants to sleep.  More confused past sev days.  Not recognizing house nor son(lives w/him).    On pred 7.'5mg'$ /d.  pt doesn't want to on it any more.  Was inc to '20mg'$  2 mo ago d/t flare neck pain, HA, Giant cell arteritis.  Pain in  Back of head pounding/constant/dull.  R ear pain today.  ?Fatigue-not taking valium, nor tramadol for 1 wk.  Just taking tylenol.  Still tired. ? ?There are no preventive care reminders to display for this patient. ? ?Past Medical History:  ?Diagnosis Date  ? Abdominal bloating   ? Abnormal LFTs (liver function tests)   ? Ankle fracture, bimalleolar, closed, right, initial encounter   ? Anxiety disorder   ? Arthritis   ? Asthma   ? Bimalleolar ankle fracture, right, closed, initial encounter 01/06/2019  ? Depression   ? Diverticulosis   ? Edema   ? Fatty liver   ? Fibromyalgia   ? GERD (gastroesophageal reflux disease)   ? Giant cell arteritis ([place])   ? No biopsy secondary to steroid use  ? Hx of adenomatous colonic polyps   ? Hyperlipidemia   ? Hypertension   ? IBS (irritable bowel syndrome)   ? Lumbar radiculitis   ? Osteoporosis 09/2017  ? T score -2.6 left femoral neck  ? Peptic ulcer disease   ? Skin cancer   ? shoulder, leg, right eye (? squamous by resection)  ? Thoracic radiculitis   ? Thyroid nodule   ? "removed"  ? Urinary incontinence    ? Vertigo   ? ? ?Past Surgical History:  ?Procedure Laterality Date  ? ABDOMINAL HYSTERECTOMY    ? BSO  ? BACK SURGERY  2005  ? lower back  ? EYE SURGERY Right 2006  ? LIVER BIOPSY    ? fatty liver  ? NASAL SEPTUM SURGERY    ? ORIF ANKLE FRACTURE Right 01/11/2019  ? Procedure: OPEN REDUCTION INTERNAL FIXATION (ORIF) RIGHT BIMALLEOLAR ANKLE FRACTURE;  Surgeon: Leandrew Koyanagi, MD;  Location: [facility];  Service: Orthopedics;  Laterality: Right;  ? right knee surgery  2006  ? arthroscopy  ? right leg skin cancer surgery  2014  ? right shoulder skin cancer excision  2012  ? SKIN CANCER EXCISION    ? THYROIDECTOMY    ? ? ?Outpatient Medications Prior to Visit  ?Medication Sig Dispense Refill  ? albuterol (VENTOLIN HFA) 108 (90 Base) MCG/ACT inhaler USE 2 INHALATIONS UP TO EVERY 4 HOURS IF CANNOT CATCH YOUR BREATH 18 g 1  ? aspirin EC 81 MG tablet Take 1 tablet (81 mg total) by mouth 2 (two) times daily. 84 tablet 0  ? budesonide-formoterol (SYMBICORT) 160-4.5  MCG/ACT inhaler Inhale 2 puffs into the lungs 2 (two) times daily. 3 each 3  ? calcium-vitamin D (OSCAL WITH D) 500-200 MG-UNIT tablet Take 1 tablet by mouth 3 (three) times daily. 90 tablet 6  ? carbamide peroxide (DEBROX) 6.5 % OTIC solution Use once a month to keep cerumen form building up. 15 mL 0  ? cephALEXin (KEFLEX) 250 MG capsule Take 250 mg by mouth at bedtime.    ? Cholecalciferol (VITAMIN D3) 20 MCG (800 UNIT) TABS Take 1 tablet by mouth daily. 90 tablet 3  ? Cyanocobalamin (VITAMIN B-12) 5000 MCG SUBL Place 0.0001 tablets (0.5 mcg total) under the tongue daily. Place 2500MCG under the tongue daily. 1 tablet 11  ? diazepam (VALIUM) 5 MG tablet 1/2 tab PO in the morning and 1 tab QHS PRN. 135 tablet 1  ? estradiol (ESTRACE) 0.1 MG/GM vaginal cream Place vaginally.    ? FLUoxetine (PROZAC) 20 MG capsule Take 3 capsules (60 mg total) by mouth daily. 3 caps daily (Patient taking differently: Take 60 mg by mouth daily. 2 caps daily) 270  capsule 1  ? neomycin-colistin-hydrocortisone-thonzonium (CORTISPORIN-TC) 3.05-18-08-0.5 MG/ML OTIC suspension Place 3 drops into the right ear 4 (four) times daily. 10 mL 0  ? olmesartan (BENICAR) 40 MG tablet Take 1 tablet (40 mg total) by mouth daily. 90 tablet 1  ? omega-3 acid ethyl esters (LOVAZA) 1 g capsule Take 2 capsules (2 g total) by mouth 2 (two) times daily. 360 capsule 3  ? predniSONE (DELTASONE) 5 MG tablet Take 10 mg by mouth daily.    ? Probiotic Product (PROBIOTIC PO) Take by mouth daily.    ? RESTASIS 0.05 % ophthalmic emulsion 1 drop 2 (two) times daily.    ? tocilizumab (ACTEMRA) 400 MG/20ML SOLN injection     ? tocilizumab 4 mg/kg in sodium chloride 0.9 % Inject 280 mg into the vein.    ? traMADol (ULTRAM) 50 MG tablet TAKE ONE TABLET BY MOUTH EVERY 6 HOURS AS NEEDED FOR MODERATE PAIN 40 tablet 0  ? triamcinolone (KENALOG) 0.025 % ointment Apply a very thin layer to affected area every 2 days. 30 g 0  ? alendronate (FOSAMAX) 70 MG tablet Take 1 tablet (70 mg total) by mouth once a week. 12 tablet 3  ? ondansetron (ZOFRAN ODT) 8 MG disintegrating tablet Take 1 tablet (8 mg total) by mouth every 8 (eight) hours as needed for nausea or vomiting. 30 tablet 0  ? ?No facility-administered medications prior to visit.  ? ? ?Allergies  ?Allergen Reactions  ? Atorvastatin Other (See Comments)  ?  Severe headache  ? Oxycodone Itching  ?  hallucinations  ? Statins   ? Advair Diskus [Fluticasone-Salmeterol] Other (See Comments)  ?  Mouth sores  ? Sulfonamide Derivatives Rash  ? ?ROS neg/noncontributory except as noted HPI/below ?Moods-stable.  Has decreased prozac to '40mg'$  and doing ok.  No SI ?Chronic UTI-stable on prophy keflex. ? ? ?   ?Objective:  ?  ? ?BP (!) 150/70   Pulse 78   Temp 97.6 ?F (36.4 ?C) (Temporal)   Ht '5\' 3"'$  (1.6 m)   Wt 186 lb 8 oz (84.6 kg)   SpO2 96%   BMI 33.04 kg/m?  ?Wt Readings from Last 3 Encounters:  ?07/31/21 186 lb 8 oz (84.6 kg)  ?07/25/21 195 lb (88.5 kg)  ?05/03/21 195  lb 4 oz (88.6 kg)  ? ? ?Physical Exam  ? ?Gen: WDWN NAD WF in W/C w/c collar on.  ?  HEENT: NCAT, conjunctiva not injected, sclera nonicteric ?NECK:  supple, no thyromegaly, no nodes, no carotid bruits. No TTP c spine nor back of neck. ?CARDIAC: RRR, S1S2+, no murmur. DP 2+B ?LUNGS: CTAB. No wheezes ?ABDOMEN:  BS+, soft, NTND, No HSM, no masses ?EXT:  tr edema ?MSK: no gross abnormalities.  ?NEURO: A&O x1  CN II-XII intact. EOMI ?PSYCH: normal mood. Good eye contact ? ?   ?Assessment & Plan:  ? ?Problem List Items Addressed This Visit   ? ?  ? Cardiovascular and Mediastinum  ? HYPERTENSION, BENIGN - Primary  ? ?Other Visit Diagnoses   ? ? New daily persistent headache      ? Relevant Orders  ? CT HEAD WO CONTRAST (5MM)  ? Nausea      ? Cervicalgia      ? Other fatigue      ? ?  ?1.  Hypertension-chronic/stable.  Controlled for age.  Too much risk of fall if she gets hypotensive.  Continue Benicar 40 mg.  Reviewed recent labs. ?2.  Nausea-chronic, but worse lately.  May be due to headaches, slow motility, other.  Continue Zofran 8 mg every 8 hours as needed.  Will check CTA of the head ?3.  Cervicalgia-multifactorial.  Some PMR, some arthritis.  Patient has been on higher dose prednisone, however does not tolerate this well.  So she is weaning the dose.  Takes tramadol occasionally (but none for the past weeks to make sure that was not contributing to confusion) can use heat.  Consider possible injections. ?4.  Headaches-changing and getting worse.  May be coming from neck.  She is having nausea and not sure if this is coming from headaches or her slow GI motility.  Will check CT of the head (patient is a bit confused and will be hard to lie still for an MRI) ?5.  Fatigue-reviewed his labs.  Nothing unusual.  If getting worse, needs to go to the emergency room ?6.  Confusion-I suspect patient is on a downhill slide.  Combinations of medications, aging process, steroids, etc.  Check CT of the brain. ? ?F/u 1 mo.    ? ?Meds ordered this encounter  ?Medications  ? ondansetron (ZOFRAN ODT) 8 MG disintegrating tablet  ?  Sig: Take 1 tablet (8 mg total) by mouth every 8 (eight) hours as needed for nausea or vomiting.  ?  Dispense:  30

## 2021-07-31 NOTE — Patient Instructions (Addendum)
It was very nice to see you today! ?It was very nice to see you today! ? ?Prozac 1/day ?Dulcolax-daily ? ? ?PLEASE NOTE: ? ?If you had any lab tests please let us know if you have not heard back within a few days. You may see your results on MyChart before we have a chance to review them but we will give you a call once they are reviewed by [place]. If we ordered any referrals today, please let us know if you have not heard from their office within the next week.  ? ?Please try these tips to maintain a healthy lifestyle: ? ?Eat most of your calories during the day when you are active. Eliminate processed foods including packaged sweets (pies, cakes, cookies), reduce intake of potatoes, white bread, white pasta, and white rice. Look for whole grain options, oat flour or almond flour. ? ?Each meal should contain half fruits/vegetables, one quarter protein, and one quarter carbs (no bigger than a computer mouse). ? ?Cut down on sweet beverages. This includes juice, soda, and sweet tea. Also watch fruit intake, though this is a healthier sweet option, it still contains natural sugar! Limit to 3 servings daily. ? ?Drink at least 1 glass of water with each meal and aim for at least 8 glasses per day ? ?Exercise at least 150 minutes every week.   ?Prozac-only 1/day ? ? ? ?PLEASE NOTE: ? ?If you had any lab tests please let us know if you have not heard back within a few days. You may see your results on MyChart before we have a chance to review them but we will give you a call once they are reviewed by [place]. If we ordered any referrals today, please let us know if you have not heard from their office within the next week.  ? ?Please try these tips to maintain a healthy lifestyle: ? ?Eat most of your calories during the day when you are active. Eliminate processed foods including packaged sweets (pies, cakes, cookies), reduce intake of potatoes, white bread, white pasta, and white rice. Look for whole grain options, oat flour or  almond flour. ? ?Each meal should contain half fruits/vegetables, one quarter protein, and one quarter carbs (no bigger than a computer mouse). ? ?Cut down on sweet beverages. This includes juice, soda, and sweet tea. Also watch fruit intake, though this is a healthier sweet option, it still contains natural sugar! Limit to 3 servings daily. ? ?Drink at least 1 glass of water with each meal and aim for at least 8 glasses per day ? ?Exercise at least 150 minutes every week.   ?

## 2021-08-03 ENCOUNTER — Ambulatory Visit
Admission: RE | Admit: 2021-08-03 | Discharge: 2021-08-03 | Disposition: A | Discharge status: Home / Self Care | Payer: Medicare Other | Source: Ambulatory Visit | Attending: Family Medicine | Admitting: Family Medicine

## 2021-08-03 DIAGNOSIS — R519 Headache, unspecified: Secondary | ICD-10-CM | POA: Diagnosis not present

## 2021-08-03 DIAGNOSIS — G4452 New daily persistent headache (NDPH): Secondary | ICD-10-CM

## 2021-08-07 ENCOUNTER — Telehealth: Payer: Self-pay | Admitting: Family Medicine

## 2021-08-07 NOTE — Telephone Encounter (Signed)
Please see note below. 

## 2021-08-07 NOTE — Telephone Encounter (Signed)
Pt's daughter states pt is needing something for constipation for the new meds she was put on for nausea. Also, pt is having issues sleeping stating she is scared but not sure of what. Please advise

## 2021-08-08 ENCOUNTER — Other Ambulatory Visit: Payer: Self-pay

## 2021-08-08 ENCOUNTER — Emergency Department (HOSPITAL_BASED_OUTPATIENT_CLINIC_OR_DEPARTMENT_OTHER)
Admission: EM | Admit: 2021-08-08 | Discharge: 2021-08-08 | Disposition: A | Discharge status: Home / Self Care | Payer: Medicare Other | Attending: Emergency Medicine | Admitting: Emergency Medicine

## 2021-08-08 ENCOUNTER — Encounter (HOSPITAL_BASED_OUTPATIENT_CLINIC_OR_DEPARTMENT_OTHER): Payer: Self-pay | Admitting: Emergency Medicine

## 2021-08-08 ENCOUNTER — Other Ambulatory Visit: Payer: Self-pay | Admitting: *Deleted

## 2021-08-08 ENCOUNTER — Emergency Department (HOSPITAL_BASED_OUTPATIENT_CLINIC_OR_DEPARTMENT_OTHER): Payer: Medicare Other

## 2021-08-08 DIAGNOSIS — Z79899 Other long term (current) drug therapy: Secondary | ICD-10-CM | POA: Insufficient documentation

## 2021-08-08 DIAGNOSIS — K5909 Other constipation: Secondary | ICD-10-CM | POA: Diagnosis not present

## 2021-08-08 DIAGNOSIS — I10 Essential (primary) hypertension: Secondary | ICD-10-CM | POA: Insufficient documentation

## 2021-08-08 DIAGNOSIS — K5730 Diverticulosis of large intestine without perforation or abscess without bleeding: Secondary | ICD-10-CM | POA: Diagnosis not present

## 2021-08-08 DIAGNOSIS — Z7982 Long term (current) use of aspirin: Secondary | ICD-10-CM | POA: Diagnosis not present

## 2021-08-08 DIAGNOSIS — K5900 Constipation, unspecified: Secondary | ICD-10-CM | POA: Diagnosis not present

## 2021-08-08 DIAGNOSIS — K7689 Other specified diseases of liver: Secondary | ICD-10-CM | POA: Diagnosis not present

## 2021-08-08 DIAGNOSIS — R110 Nausea: Secondary | ICD-10-CM | POA: Insufficient documentation

## 2021-08-08 DIAGNOSIS — N3289 Other specified disorders of bladder: Secondary | ICD-10-CM | POA: Diagnosis not present

## 2021-08-08 LAB — CBC WITH DIFFERENTIAL/PLATELET
Abs Immature Granulocytes: 0.04 10*3/uL (ref 0.00–0.07)
Basophils Absolute: 0 10*3/uL (ref 0.0–0.1)
Basophils Relative: 1 %
Eosinophils Absolute: 0.1 10*3/uL (ref 0.0–0.5)
Eosinophils Relative: 2 %
HCT: 43.4 % (ref 36.0–46.0)
Hemoglobin: 14.8 g/dL (ref 12.0–15.0)
Immature Granulocytes: 1 %
Lymphocytes Relative: 21 %
Lymphs Abs: 1.7 10*3/uL (ref 0.7–4.0)
MCH: 33.1 pg (ref 26.0–34.0)
MCHC: 34.1 g/dL (ref 30.0–36.0)
MCV: 97.1 fL (ref 80.0–100.0)
Monocytes Absolute: 0.7 10*3/uL (ref 0.1–1.0)
Monocytes Relative: 8 %
Neutro Abs: 5.7 10*3/uL (ref 1.7–7.7)
Neutrophils Relative %: 67 %
Platelets: 172 10*3/uL (ref 150–400)
RBC: 4.47 MIL/uL (ref 3.87–5.11)
RDW: 13 % (ref 11.5–15.5)
WBC: 8.3 10*3/uL (ref 4.0–10.5)
nRBC: 0 % (ref 0.0–0.2)

## 2021-08-08 LAB — COMPREHENSIVE METABOLIC PANEL
ALT: 32 U/L (ref 0–44)
AST: 36 U/L (ref 15–41)
Albumin: 3.5 g/dL (ref 3.5–5.0)
Alkaline Phosphatase: 60 U/L (ref 38–126)
Anion gap: 6 (ref 5–15)
BUN: 12 mg/dL (ref 8–23)
CO2: 27 mmol/L (ref 22–32)
Calcium: 8.9 mg/dL (ref 8.9–10.3)
Chloride: 102 mmol/L (ref 98–111)
Creatinine, Ser: 0.85 mg/dL (ref 0.44–1.00)
GFR, Estimated: >60 mL/min (ref >60)
Glucose, Bld: 99 mg/dL (ref 70–99)
Potassium: 4.3 mmol/L (ref 3.5–5.1)
Sodium: 135 mmol/L (ref 135–145)
Total Bilirubin: 0.7 mg/dL (ref 0.3–1.2)
Total Protein: 6 g/dL — ABNORMAL LOW (ref 6.5–8.1)

## 2021-08-08 LAB — URINALYSIS, MICROSCOPIC (REFLEX)

## 2021-08-08 LAB — URINALYSIS, ROUTINE W REFLEX MICROSCOPIC
Bilirubin Urine: NEGATIVE
Glucose, UA: NEGATIVE mg/dL
Ketones, ur: NEGATIVE mg/dL
Leukocytes,Ua: NEGATIVE
Nitrite: NEGATIVE
Protein, ur: 30 mg/dL — AB
Specific Gravity, Urine: <=1.005 (ref 1.005–1.030)
pH: 5 (ref 5.0–8.0)

## 2021-08-08 MED ORDER — LACTULOSE 10 GM/15ML PO SOLN
20.0000 g | Freq: Once | ORAL | Status: AC
  Administered 2021-08-08: 20 g via ORAL

## 2021-08-08 MED ORDER — MIRTAZAPINE 7.5 MG PO TABS: 7.5000 mg | ORAL_TABLET | Freq: Every day | ORAL | 1 refills | Status: DC

## 2021-08-08 MED ORDER — SODIUM CHLORIDE 0.9 % IV BOLUS
500.0000 mL | Freq: Once | INTRAVENOUS | Status: AC
  Administered 2021-08-08: 500 mL via INTRAVENOUS

## 2021-08-08 MED ORDER — GOLYTELY 236 G PO SOLR: 4.0000 L | Freq: Once | ORAL | 0 refills | Status: AC

## 2021-08-08 MED ORDER — LACTULOSE 10 GM/15ML PO SOLN: 30.0000 g | Freq: Once | ORAL | Status: DC

## 2021-08-08 MED ORDER — ONDANSETRON HCL 4 MG/2ML IJ SOLN
4.0000 mg | Freq: Once | INTRAMUSCULAR | Status: AC
  Administered 2021-08-08: 4 mg via INTRAVENOUS
  Filled 2021-08-08: qty 2

## 2021-08-08 MED ORDER — IOHEXOL 300 MG/ML  SOLN
100.0000 mL | Freq: Once | INTRAMUSCULAR | Status: AC | PRN
  Administered 2021-08-08: 100 mL via INTRAVENOUS

## 2021-08-08 NOTE — ED Provider Notes (Signed)
[facility] EMERGENCY DEPARTMENT Provider Note   CSN: [id number] Arrival date & time: 08/08/21  2016     History  Chief Complaint  Patient presents with   Constipation    [name] is a 88 y.o. female history of hypertension, neck arthritis, presenting with constipation.  Patient has been constipated for the last 5 days.  Family has tried suppositories and MiraLAX and even tried to perform rectal exam.  However patient only had minimal stools that came out.  Patient had some nausea as well.  No history of obstruction.   The history is provided by the patient and a relative.      Home Medications Prior to Admission medications   Medication Sig Start Date End Date Taking? Authorizing Provider  albuterol (VENTOLIN HFA) 108 (90 Base) MCG/ACT inhaler USE 2 INHALATIONS UP TO EVERY 4 HOURS IF CANNOT CATCH YOUR BREATH 05/03/21   Tawnya Crook, MD  aspirin EC 81 MG tablet Take 1 tablet (81 mg total) by mouth 2 (two) times daily. 01/11/19   Leandrew Koyanagi, MD  budesonide-formoterol [facility]) 160-4.5 MCG/ACT inhaler Inhale 2 puffs into the lungs 2 (two) times daily. 05/03/21   Tawnya Crook, MD  calcium-vitamin D (OSCAL WITH D) 500-200 MG-UNIT tablet Take 1 tablet by mouth 3 (three) times daily. 01/11/19   Leandrew Koyanagi, MD  carbamide peroxide (DEBROX) 6.5 % OTIC solution Use once a month to keep cerumen form building up. 07/23/19   Kuneff, Renee A, DO  cephALEXin (KEFLEX) 250 MG capsule Take 250 mg by mouth at bedtime. 03/30/21   [provider]  Cholecalciferol (VITAMIN D3) 20 MCG (800 UNIT) TABS Take 1 tablet by mouth daily. 08/19/19   Kuneff, Renee A, DO  Cyanocobalamin (VITAMIN B-12) 5000 MCG SUBL Place 0.0001 tablets (0.5 mcg total) under the tongue daily. Place 2500MCG under the tongue daily. 08/19/19   Kuneff, Renee A, DO  diazepam (VALIUM) 5 MG tablet 1/2 tab PO in the morning and 1 tab QHS PRN. 06/12/21   Tawnya Crook, MD  estradiol (ESTRACE) 0.1 MG/GM vaginal  cream Place vaginally. 03/30/21   [provider]  FLUoxetine (PROZAC) 20 MG capsule Take 3 capsules (60 mg total) by mouth daily. 3 caps daily Patient taking differently: Take 60 mg by mouth daily. 2 caps daily 05/23/21   Tawnya Crook, MD  mirtazapine (REMERON) 7.5 MG tablet Take 1 tablet (7.5 mg total) by mouth at bedtime. 08/08/21   Tawnya Crook, MD  neomycin-colistin-hydrocortisone-thonzonium (CORTISPORIN-TC) 3.05-18-08-0.5 MG/ML OTIC suspension Place 3 drops into the right ear 4 (four) times daily. 08/17/20   Kuneff, Renee A, DO  olmesartan (BENICAR) 40 MG tablet Take 1 tablet (40 mg total) by mouth daily. 05/03/21   Tawnya Crook, MD  omega-3 acid ethyl esters (LOVAZA) 1 g capsule Take 2 capsules (2 g total) by mouth 2 (two) times daily. 05/03/21   Tawnya Crook, MD  ondansetron (ZOFRAN ODT) 8 MG disintegrating tablet Take 1 tablet (8 mg total) by mouth every 8 (eight) hours as needed for nausea or vomiting. 07/31/21   Tawnya Crook, MD  predniSONE (DELTASONE) 5 MG tablet Take 10 mg by mouth daily. 07/22/16   [provider]  Probiotic Product (PROBIOTIC PO) Take by mouth daily.    [provider]  RESTASIS 0.05 % ophthalmic emulsion 1 drop 2 (two) times daily. 04/05/20   [provider]  tocilizumab (ACTEMRA) 400 MG/20ML SOLN injection  11/27/17   [provider]  tocilizumab 4 mg/kg in sodium chloride 0.9 % Inject 280 mg into the vein. 12/03/16   [provider]  traMADol (ULTRAM) 50 MG tablet TAKE ONE TABLET BY MOUTH EVERY 6 HOURS AS NEEDED FOR MODERATE PAIN 11/12/17   Jessy Oto, MD  triamcinolone (KENALOG) 0.025 % ointment Apply a very thin layer to affected area every 2 days. 09/24/17   Princess Bruins, MD      Allergies    Atorvastatin, Oxycodone, Statins, Advair diskus [fluticasone-salmeterol], and Sulfonamide derivatives    Review of Systems   Review of Systems  Gastrointestinal:  Positive for abdominal pain and  constipation.  All other systems reviewed and are negative.  Physical Exam Updated Vital Signs BP (!) 176/66   Pulse 73   Temp 97.7 F (36.5 C) (Oral)   Resp 18   Ht '5\' 3"'$  (1.6 m)   Wt 84.5 kg   SpO2 97%   BMI 33.00 kg/m  Physical Exam Vitals and nursing note reviewed.  Constitutional:      Comments: Chronically ill, slightly uncomfortable  HENT:     Head: Normocephalic.     Nose: Nose normal.     Mouth/Throat:     Mouth: Mucous membranes are dry.  Eyes:     Extraocular Movements: Extraocular movements intact.     Pupils: Pupils are equal, round, and reactive to light.  Cardiovascular:     Rate and Rhythm: Normal rate and regular rhythm.     Pulses: Normal pulses.     Heart sounds: Normal heart sounds.  Pulmonary:     Effort: Pulmonary effort is normal.     Breath sounds: Normal breath sounds.  Abdominal:     General: Abdomen is flat.     Palpations: Abdomen is soft.  Genitourinary:    Comments: Rectal- no stool at the vault  Musculoskeletal:        General: Normal range of motion.     Cervical back: Normal range of motion and neck supple.  Skin:    General: Skin is warm.     Capillary Refill: Capillary refill takes less than 2 seconds.  Neurological:     General: No focal deficit present.     Mental Status: She is oriented to person, place, and time.  Psychiatric:        Mood and Affect: Mood normal.        Behavior: Behavior normal.    ED Results / Procedures / Treatments   Labs (all labs ordered are listed, but only abnormal results are displayed) Labs Reviewed  COMPREHENSIVE METABOLIC PANEL - Abnormal; Notable for the following components:      Result Value   Total Protein 6.0 (*)    All other components within normal limits  URINE CULTURE  CBC WITH DIFFERENTIAL/PLATELET  URINALYSIS, ROUTINE W REFLEX MICROSCOPIC    EKG None  Radiology No results found.  Procedures Procedures    Medications Ordered in ED Medications  sodium chloride 0.9  % bolus 500 mL (500 mLs Intravenous New Bag/Given 08/08/21 2118)  ondansetron [facility]) injection 4 mg (4 mg Intravenous Given 08/08/21 2126)  lactulose (CHRONULAC) 10 GM/15ML solution 20 g (20 g Oral Given 08/08/21 2127)    ED Course/ Medical Decision Making/ A&P                           Medical Decision Making Meta Kroenke is a 88 y.o. female here presenting with constipation.  Patient is on pain medicine for her neck pain and likely cause constipation.  Consider ileus versus SBO versus constipation.  Rectal exam showed no obvious stool impaction.  Plan to get CBC and CMP and CT abdomen pelvis.  11:11 PM Labs unremarkable.  UA normal.  CT showed constipation with no obstruction.  At this point patient is stable for discharge.  She can be discharged home with GoLytely  Problems Addressed: Other constipation: acute illness or injury  Amount and/or Complexity of Data Reviewed Labs: ordered. Decision-making details documented in ED Course. Radiology: ordered and independent interpretation performed. Decision-making details documented in ED Course.  Risk Prescription drug management.    Final Clinical Impression(s) / ED Diagnoses Final diagnoses:  None    Rx / DC Orders ED Discharge Orders     None         Drenda Freeze, MD 08/08/21 2312

## 2021-08-08 NOTE — ED Triage Notes (Signed)
Pt here with constipation, has tried suppository and enema and manual removal at home.

## 2021-08-08 NOTE — Telephone Encounter (Signed)
Rx sent to the pharmacy. [name] notified and verbalized understanding. [name] stated that patient has been complaining of burning with urination, she did a home dipstick and it started changing colors. Daughter stated that patient already take an antibiotic nightly, wanted to know if she should up the dosage or what should she do. Please advise.

## 2021-08-08 NOTE — ED Notes (Signed)
Patient transported to CT 

## 2021-08-08 NOTE — Discharge Instructions (Signed)
Please take GoLytely as prescribed.  You likely will have bowel movements afterwards  Stay hydrated  You need to see GI doctor  Return to ER if you have worse abdominal pain, vomiting

## 2021-08-08 NOTE — Telephone Encounter (Signed)
[name] notified and verbalized understanding.

## 2021-08-10 LAB — URINE CULTURE: Culture: 70000 — AB

## 2021-08-11 ENCOUNTER — Telehealth (HOSPITAL_BASED_OUTPATIENT_CLINIC_OR_DEPARTMENT_OTHER): Payer: Self-pay | Admitting: *Deleted

## 2021-08-11 NOTE — Telephone Encounter (Addendum)
Post ED Visit - Positive Culture Follow-up  Culture report reviewed by antimicrobial stewardship pharmacist: [facility] Team '[]'$  Elenor Quinones, Pharm.D. '[]'$  Heide Guile, Pharm.D., BCPS AQ-ID '[]'$  Parks Neptune, Pharm.D., BCPS '[]'$  Alycia Rossetti, Pharm.D., BCPS '[]'$  McDonald, Pharm.D., BCPS, AAHIVP '[]'$  Legrand Como, Pharm.D., BCPS, AAHIVP '[]'$  Salome Arnt, PharmD, BCPS '[]'$  Johnnette Gourd, PharmD, BCPS '[]'$  Hughes Better, PharmD, BCPS '[]'$  Leeroy Cha, PharmD '[]'$  Laqueta Linden, PharmD, BCPS '[x]'$  Bertis Ruddy  PharmD  [place] Team '[]'$  Leodis Sias, PharmD '[]'$  Lindell Spar, PharmD '[]'$  Royetta Asal, PharmD '[]'$  Graylin Shiver, Rph '[]'$  Rema Fendt) Glennon Mac, PharmD '[]'$  Arlyn Dunning, PharmD '[]'$  Netta Cedars, PharmD '[]'$  Dia Sitter, PharmD '[]'$  Leone Haven, PharmD '[]'$  Gretta Arab, PharmD '[]'$  Theodis Shove, PharmD '[]'$  Peggyann Juba, PharmD '[]'$  Reuel Boom, PharmD   Positive urine culture Do not treat  and no further patient follow-up is required at this time per Benedetto Goad, PA  Rosie Fate 08/11/2021, 11:23 AM

## 2021-08-14 ENCOUNTER — Other Ambulatory Visit: Payer: Self-pay

## 2021-08-14 DIAGNOSIS — I10 Essential (primary) hypertension: Secondary | ICD-10-CM

## 2021-08-15 DIAGNOSIS — M316 Other giant cell arteritis: Secondary | ICD-10-CM | POA: Diagnosis not present

## 2021-08-28 ENCOUNTER — Ambulatory Visit: Payer: Medicare Other | Admitting: Family Medicine

## 2021-08-29 ENCOUNTER — Ambulatory Visit (INDEPENDENT_AMBULATORY_CARE_PROVIDER_SITE_OTHER): Payer: Medicare Other | Admitting: Family Medicine

## 2021-08-29 ENCOUNTER — Encounter: Payer: Self-pay | Admitting: Family Medicine

## 2021-08-29 VITALS — BP 150/70 | HR 78 | Temp 98.1°F | Ht 63.0 in | Wt 180.2 lb

## 2021-08-29 DIAGNOSIS — K5909 Other constipation: Secondary | ICD-10-CM

## 2021-08-29 DIAGNOSIS — I10 Essential (primary) hypertension: Secondary | ICD-10-CM

## 2021-08-29 DIAGNOSIS — R300 Dysuria: Secondary | ICD-10-CM | POA: Diagnosis not present

## 2021-08-29 DIAGNOSIS — G2581 Restless legs syndrome: Secondary | ICD-10-CM | POA: Diagnosis not present

## 2021-08-29 DIAGNOSIS — M542 Cervicalgia: Secondary | ICD-10-CM

## 2021-08-29 LAB — POCT URINALYSIS DIPSTICK
Bilirubin, UA: NEGATIVE
Blood, UA: NEGATIVE
Glucose, UA: NEGATIVE
Ketones, UA: NEGATIVE
Leukocytes, UA: NEGATIVE
Nitrite, UA: NEGATIVE
Protein, UA: POSITIVE — AB
Spec Grav, UA: 1.02 (ref 1.010–1.025)
Urobilinogen, UA: 1 E.U./dL
pH, UA: 5 (ref 5.0–8.0)

## 2021-08-29 MED ORDER — AMLODIPINE BESYLATE 2.5 MG PO TABS: 2.5000 mg | ORAL_TABLET | Freq: Every day | ORAL | 1 refills | Status: DC

## 2021-08-29 NOTE — Patient Instructions (Addendum)
It was very nice to see you today!  Dove soap for bottom.  Estrace cream daily. Miralax daily,  magnesium daily-milk of magnesia. Or twice/day.   senekot 2 tabs twice daily  See ortho for neck.   PLEASE NOTE:  If you had any lab tests please let us know if you have not heard back within a few days. You may see your results on MyChart before we have a chance to review them but we will give you a call once they are reviewed by [place]. If we ordered any referrals today, please let us know if you have not heard from their office within the next week.   Please try these tips to maintain a healthy lifestyle:  Eat most of your calories during the day when you are active. Eliminate processed foods including packaged sweets (pies, cakes, cookies), reduce intake of potatoes, white bread, white pasta, and white rice. Look for whole grain options, oat flour or almond flour.  Each meal should contain half fruits/vegetables, one quarter protein, and one quarter carbs (no bigger than a computer mouse).  Cut down on sweet beverages. This includes juice, soda, and sweet tea. Also watch fruit intake, though this is a healthier sweet option, it still contains natural sugar! Limit to 3 servings daily.  Drink at least 1 glass of water with each meal and aim for at least 8 glasses per day  Exercise at least 150 minutes every week.

## 2021-08-29 NOTE — Progress Notes (Signed)
Subjective:     Patient ID: [name], female    DOB: [birth year], 88 y.o.   MRN: [id number]  Chief Complaint  Patient presents with   Follow-up    4 week follow-up on HTN, recurrent neck pain that radiates to shoulders at times   Burning with urination    Burning with urination that started 4 or 5 days ago    HPI-here w/daughter  HTN-running around 150's/70's.  No cp/sob.   Dysuria for 4-5 days-had missed some keflex and estrace.   Azo seemed to help. Neck pain-bad and ha.  "Don't want to live like this"  no tramadol for long time d/t constipation.  Taking oatmeal daily helping the constpation some.docusate 2 bid not working.    There are no preventive care reminders to display for this patient.  Past Medical History:  Diagnosis Date   Abdominal bloating    Abnormal LFTs (liver function tests)    Ankle fracture, bimalleolar, closed, right, initial encounter    Anxiety disorder    Arthritis    Asthma    Bimalleolar ankle fracture, right, closed, initial encounter 01/06/2019   Depression    Diverticulosis    Edema    Fatty liver    Fibromyalgia    GERD (gastroesophageal reflux disease)    Giant cell arteritis (HCC)    No biopsy secondary to steroid use   Hx of adenomatous colonic polyps    Hyperlipidemia    Hypertension    IBS (irritable bowel syndrome)    Lumbar radiculitis    Osteoporosis 09/2017   T score -2.6 left femoral neck   Peptic ulcer disease    Skin cancer    shoulder, leg, right eye (? squamous by resection)   Thoracic radiculitis    Thyroid nodule    "removed"   Urinary incontinence    Vertigo     Past Surgical History:  Procedure Laterality Date   ABDOMINAL HYSTERECTOMY     BSO   BACK SURGERY  2005   lower back   EYE SURGERY Right 2006   LIVER BIOPSY     fatty liver   NASAL SEPTUM SURGERY     ORIF ANKLE FRACTURE Right 01/11/2019   Procedure: OPEN REDUCTION INTERNAL FIXATION (ORIF) RIGHT BIMALLEOLAR ANKLE FRACTURE;  Surgeon: Leandrew Koyanagi, MD;  Location: [facility];  Service: Orthopedics;  Laterality: Right;   right knee surgery  2006   arthroscopy   right leg skin cancer surgery  2014   right shoulder skin cancer excision  2012   SKIN CANCER EXCISION     THYROIDECTOMY      Outpatient Medications Prior to Visit  Medication Sig Dispense Refill   albuterol (VENTOLIN HFA) 108 (90 Base) MCG/ACT inhaler USE 2 INHALATIONS UP TO EVERY 4 HOURS IF CANNOT CATCH YOUR BREATH 18 g 1   aspirin EC 81 MG tablet Take 1 tablet (81 mg total) by mouth 2 (two) times daily. 84 tablet 0   budesonide-formoterol (SYMBICORT) 160-4.5 MCG/ACT inhaler Inhale 2 puffs into the lungs 2 (two) times daily. 3 each 3   calcium-vitamin D (OSCAL WITH D) 500-200 MG-UNIT tablet Take 1 tablet by mouth 3 (three) times daily. 90 tablet 6   carbamide peroxide (DEBROX) 6.5 % OTIC solution Use once a month to keep cerumen form building up. 15 mL 0   cephALEXin (KEFLEX) 250 MG capsule Take 250 mg by mouth at bedtime.     Cholecalciferol (VITAMIN D3) 20 MCG (800 UNIT)  TABS Take 1 tablet by mouth daily. 90 tablet 3   Cyanocobalamin (VITAMIN B-12) 5000 MCG SUBL Place 0.0001 tablets (0.5 mcg total) under the tongue daily. Place 2500MCG under the tongue daily. 1 tablet 11   estradiol (ESTRACE) 0.1 MG/GM vaginal cream Place vaginally.     FLUoxetine (PROZAC) 20 MG capsule Take 3 capsules (60 mg total) by mouth daily. 3 caps daily (Patient taking differently: Take 60 mg by mouth daily. 2 caps daily) 270 capsule 1   mirtazapine (REMERON) 7.5 MG tablet Take 1 tablet (7.5 mg total) by mouth at bedtime. 30 tablet 1   neomycin-colistin-hydrocortisone-thonzonium (CORTISPORIN-TC) 3.05-18-08-0.5 MG/ML OTIC suspension Place 3 drops into the right ear 4 (four) times daily. 10 mL 0   olmesartan (BENICAR) 40 MG tablet Take 1 tablet (40 mg total) by mouth daily. 90 tablet 1   omega-3 acid ethyl esters (LOVAZA) 1 g capsule Take 2 capsules (2 g total) by mouth 2 (two)  times daily. 360 capsule 3   ondansetron (ZOFRAN ODT) 8 MG disintegrating tablet Take 1 tablet (8 mg total) by mouth every 8 (eight) hours as needed for nausea or vomiting. 30 tablet 3   predniSONE (DELTASONE) 5 MG tablet Take 10 mg by mouth daily.     Probiotic Product (PROBIOTIC PO) Take by mouth daily.     RESTASIS 0.05 % ophthalmic emulsion 1 drop 2 (two) times daily.     tocilizumab (ACTEMRA) 400 MG/20ML SOLN injection      tocilizumab 4 mg/kg in sodium chloride 0.9 % Inject 280 mg into the vein.     traMADol (ULTRAM) 50 MG tablet TAKE ONE TABLET BY MOUTH EVERY 6 HOURS AS NEEDED FOR MODERATE PAIN 40 tablet 0   triamcinolone (KENALOG) 0.025 % ointment Apply a very thin layer to affected area every 2 days. 30 g 0   diazepam (VALIUM) 5 MG tablet 1/2 tab PO in the morning and 1 tab QHS PRN. 135 tablet 1   No facility-administered medications prior to visit.    Allergies  Allergen Reactions   Atorvastatin Other (See Comments)    Severe headache   Oxycodone Itching    hallucinations   Statins    Advair Diskus [Fluticasone-Salmeterol] Other (See Comments)    Mouth sores   Sulfonamide Derivatives Rash   ROS neg/noncontributory except as noted HPI/below      Objective:     BP (!) 150/70   Pulse 78   Temp 98.1 F (36.7 C) (Temporal)   Ht '5\' 3"'$  (1.6 m)   Wt 180 lb 4 oz (81.8 kg)   SpO2 96%   BMI 31.93 kg/m  Wt Readings from Last 3 Encounters:  08/29/21 180 lb 4 oz (81.8 kg)  08/08/21 186 lb 4.6 oz (84.5 kg)  07/31/21 186 lb 8 oz (84.6 kg)    Physical Exam   Gen: WDWN NAD HEENT: NCAT, conjunctiva not injected, sclera nonicteric NECK:  supple, no thyromegaly, no nodes, no carotid bruits CARDIAC: RRR, S1S2+, no murmur. DP 21+B LUNGS: CTAB. No wheezes ABDOMEN:  BS+, soft, NTND, No HSM, no masses EXT:  1/2+ edema MSK: in w/c NEURO: A&O  CN II-XII intact.  PSYCH: normal mood. Good eye contact  Results for orders placed or performed in visit on 08/29/21  POCT  urinalysis dipstick  Result Value Ref Range   Color, UA yellow    Clarity, UA cloudy    Glucose, UA Negative Negative   Bilirubin, UA neg    Ketones, UA neg  Spec Grav, UA 1.020 1.010 - 1.025   Blood, UA neg    pH, UA 5.0 5.0 - 8.0   Protein, UA Positive (A) Negative   Urobilinogen, UA 1.0 0.2 or 1.0 E.U./dL   Nitrite, UA neg    Leukocytes, UA Negative Negative   Appearance     Odor          Assessment & Plan:   Problem List Items Addressed This Visit       Cardiovascular and Mediastinum   HYPERTENSION, BENIGN   Relevant Medications   amLODipine (NORVASC) 2.5 MG tablet   Other Visit Diagnoses     Burning with urination    -  Primary   Relevant Orders   POCT urinalysis dipstick (Completed)   Cervicalgia       Chronic constipation         1.  Hypertension-chronic.  Not well controlled.  Not sure if pain is contributing to lack of control, or needing new medication.  Continue Benicar 40 mg.  Will add amlodipine 2.5 mg (hopefully would want to increase edema).  Monitor blood pressures.  Am concerned about putting her on diuretics as would probably need potassium as well.  Follow-up in 1 month 2.  Dysuria-patient has chronic UTI.  She had missed some doses of Keflex and Estrace cream.  Advised to get back on preventative Keflex and do the Estrace cream topically daily to urethra.  Advised to use [employer].  Constipation may be contributing as well. 3.  Chronic constipation-was exacerbated as she was on chronic narcotics in the past.  She did have an episode and presented to the emergency room 08/08/2021.  There was no stool in the vault, however CT showed large stool burden.  She states that still cut oatmeal is helping a lot.  The Docusate did not seem to help.  Daughter cannot get a hold of her brother to verify what other regimen the patient is currently taking.  Advised to take MiraLAX daily, Senokot 2 tablets twice daily, milk of magnesia daily.  Follow-up with progress.  May  try to reintroduce narcotics slowly. 4.  Neck pain-possibly some cervical stenosis versus just severe arthritis.  She did not tolerate higher doses of steroids.  Cannot take NSAIDs due to renal function.  Tylenol is not helping.  Topicals are not helping.  She is starting to make comments that she "would rather die than continue with this pain".  Daughter and I discussed that if the constipation remains under good control, they find a good bowel regimen, we can try taking 1 tramadol a day to see how she does with the pain.  We may need to do something like Linzess.  Follow-up in 1 month, sooner if neck continues to be severe Meds ordered this encounter  Medications   amLODipine (NORVASC) 2.5 MG tablet    Sig: Take 1 tablet (2.5 mg total) by mouth daily.    Dispense:  90 tablet    Refill:  1    Wellington Hampshire, MD

## 2021-08-30 ENCOUNTER — Telehealth: Payer: Self-pay | Admitting: Family Medicine

## 2021-08-30 ENCOUNTER — Other Ambulatory Visit: Payer: Self-pay | Admitting: Family Medicine

## 2021-08-30 DIAGNOSIS — G2581 Restless legs syndrome: Secondary | ICD-10-CM | POA: Insufficient documentation

## 2021-08-30 DIAGNOSIS — K5909 Other constipation: Secondary | ICD-10-CM | POA: Insufficient documentation

## 2021-08-30 DIAGNOSIS — M542 Cervicalgia: Secondary | ICD-10-CM | POA: Insufficient documentation

## 2021-08-30 MED ORDER — ROPINIROLE HCL 0.5 MG PO TABS: 0.5000 mg | ORAL_TABLET | Freq: Every day | ORAL | 3 refills | Status: DC

## 2021-08-30 NOTE — Telephone Encounter (Signed)
Patient's daughter called stating Dr. Cherlynn Kaiser needed to know the dosage of patient's past RLS medication, Requip. She states the dosage was 5 mg and she would like [facility].

## 2021-08-30 NOTE — Addendum Note (Signed)
Addended by: Wellington Hampshire on: 08/30/2021 08:27 PM   Modules accepted: Orders

## 2021-08-31 NOTE — Telephone Encounter (Signed)
Spoke with [name] and confirmed dose is 0.5 mg of Requip. Informed that medication was sent to the pharmacy.

## 2021-09-13 DIAGNOSIS — M316 Other giant cell arteritis: Secondary | ICD-10-CM | POA: Diagnosis not present

## 2021-09-25 DIAGNOSIS — N952 Postmenopausal atrophic vaginitis: Secondary | ICD-10-CM | POA: Diagnosis not present

## 2021-09-25 DIAGNOSIS — R300 Dysuria: Secondary | ICD-10-CM | POA: Diagnosis not present

## 2021-09-25 DIAGNOSIS — N3946 Mixed incontinence: Secondary | ICD-10-CM | POA: Diagnosis not present

## 2021-09-25 DIAGNOSIS — N3020 Other chronic cystitis without hematuria: Secondary | ICD-10-CM | POA: Diagnosis not present

## 2021-10-02 ENCOUNTER — Ambulatory Visit (INDEPENDENT_AMBULATORY_CARE_PROVIDER_SITE_OTHER): Payer: Medicare Other | Admitting: Family Medicine

## 2021-10-02 ENCOUNTER — Encounter: Payer: Self-pay | Admitting: Family Medicine

## 2021-10-02 VITALS — BP 138/62 | HR 78 | Temp 98.7°F | Ht 63.0 in | Wt 193.4 lb

## 2021-10-02 DIAGNOSIS — N390 Urinary tract infection, site not specified: Secondary | ICD-10-CM | POA: Diagnosis not present

## 2021-10-02 DIAGNOSIS — M542 Cervicalgia: Secondary | ICD-10-CM

## 2021-10-02 DIAGNOSIS — I10 Essential (primary) hypertension: Secondary | ICD-10-CM | POA: Diagnosis not present

## 2021-10-02 DIAGNOSIS — G2581 Restless legs syndrome: Secondary | ICD-10-CM | POA: Diagnosis not present

## 2021-10-02 DIAGNOSIS — K5909 Other constipation: Secondary | ICD-10-CM | POA: Diagnosis not present

## 2021-10-02 MED ORDER — AMLODIPINE BESYLATE 2.5 MG PO TABS: 2.5000 mg | ORAL_TABLET | Freq: Every day | ORAL | 1 refills | Status: DC

## 2021-10-02 MED ORDER — ROPINIROLE HCL 1 MG PO TABS: 1.0000 mg | ORAL_TABLET | Freq: Every day | ORAL | 1 refills | Status: DC

## 2021-10-02 NOTE — Progress Notes (Signed)
Subjective:     Patient ID: [name], female    DOB: 27-Jul-1929, 88 y.o.   MRN: [id number]  Chief Complaint  Patient presents with   Follow-up    4 week follow-up for HTN and constipation Constipation is better    RLS    Getting worse, still not sleeping well, stopped sleep med due to side effects    HPI-here w/daughter  HTN-better at home 130's/60's.  No ha/dizziness/cp.  Min chronic edema.  No inc sob Constipation-1 stool softener bid and oatmeal daily and working well.  RLS-on requip 0.'5mg'$ .  some nights, legs going a lot.  Tonic water helps some.  Legs burning some days. Legs can feel hot at night some and uses ice.  Gabapentin-off balance and nightmares.  Has neuropathy as well.  Marland Kitchen   UTI-on augmentin from urol.  Neck pain-tramadol helps some.  Seeing pain mgmt  There are no preventive care reminders to display for this patient.  Past Medical History:  Diagnosis Date   Abdominal bloating    Abnormal LFTs (liver function tests)    Ankle fracture, bimalleolar, closed, right, initial encounter    Anxiety disorder    Arthritis    Asthma    Bimalleolar ankle fracture, right, closed, initial encounter 01/06/2019   Depression    Diverticulosis    Edema    Fatty liver    Fibromyalgia    GERD (gastroesophageal reflux disease)    Giant cell arteritis (HCC)    No biopsy secondary to steroid use   Hx of adenomatous colonic polyps    Hyperlipidemia    Hypertension    IBS (irritable bowel syndrome)    Lumbar radiculitis    Osteoporosis 09/2017   T score -2.6 left femoral neck   Peptic ulcer disease    Skin cancer    shoulder, leg, right eye (? squamous by resection)   Thoracic radiculitis    Thyroid nodule    "removed"   Urinary incontinence    Vertigo     Past Surgical History:  Procedure Laterality Date   ABDOMINAL HYSTERECTOMY     BSO   BACK SURGERY  2005   lower back   EYE SURGERY Right 2006   LIVER BIOPSY     fatty liver   NASAL SEPTUM SURGERY      ORIF ANKLE FRACTURE Right 01/11/2019   Procedure: OPEN REDUCTION INTERNAL FIXATION (ORIF) RIGHT BIMALLEOLAR ANKLE FRACTURE;  Surgeon: Leandrew Koyanagi, MD;  Location: [facility];  Service: Orthopedics;  Laterality: Right;   right knee surgery  2006   arthroscopy   right leg skin cancer surgery  2014   right shoulder skin cancer excision  2012   SKIN CANCER EXCISION     THYROIDECTOMY      Outpatient Medications Prior to Visit  Medication Sig Dispense Refill   albuterol (VENTOLIN HFA) 108 (90 Base) MCG/ACT inhaler USE 2 INHALATIONS UP TO EVERY 4 HOURS IF CANNOT CATCH YOUR BREATH 18 g 1   amoxicillin-clavulanate (AUGMENTIN) 875-125 MG tablet Take 1 tablet by mouth 2 (two) times daily.     aspirin EC 81 MG tablet Take 1 tablet (81 mg total) by mouth 2 (two) times daily. 84 tablet 0   budesonide-formoterol (SYMBICORT) 160-4.5 MCG/ACT inhaler Inhale 2 puffs into the lungs 2 (two) times daily. 3 each 3   calcium-vitamin D (OSCAL WITH D) 500-200 MG-UNIT tablet Take 1 tablet by mouth 3 (three) times daily. 90 tablet 6   carbamide peroxide (  DEBROX) 6.5 % OTIC solution Use once a month to keep cerumen form building up. 15 mL 0   cephALEXin (KEFLEX) 250 MG capsule Take 250 mg by mouth at bedtime.     Cholecalciferol (VITAMIN D3) 20 MCG (800 UNIT) TABS Take 1 tablet by mouth daily. 90 tablet 3   Cyanocobalamin (VITAMIN B-12) 5000 MCG SUBL Place 0.0001 tablets (0.5 mcg total) under the tongue daily. Place 2500MCG under the tongue daily. 1 tablet 11   estradiol (ESTRACE) 0.1 MG/GM vaginal cream Place vaginally.     FLUoxetine (PROZAC) 20 MG capsule Take 20 mg by mouth daily.     neomycin-colistin-hydrocortisone-thonzonium (CORTISPORIN-TC) 3.05-18-08-0.5 MG/ML OTIC suspension Place 3 drops into the right ear 4 (four) times daily. 10 mL 0   olmesartan (BENICAR) 40 MG tablet Take 1 tablet (40 mg total) by mouth daily. 90 tablet 1   omega-3 acid ethyl esters (LOVAZA) 1 g capsule Take 2 capsules (2 g  total) by mouth 2 (two) times daily. 360 capsule 3   ondansetron (ZOFRAN ODT) 8 MG disintegrating tablet Take 1 tablet (8 mg total) by mouth every 8 (eight) hours as needed for nausea or vomiting. 30 tablet 3   predniSONE (DELTASONE) 5 MG tablet Take 10 mg by mouth daily.     Probiotic Product (PROBIOTIC PO) Take by mouth daily.     RESTASIS 0.05 % ophthalmic emulsion 1 drop 2 (two) times daily.     tocilizumab (ACTEMRA) 400 MG/20ML SOLN injection      tocilizumab 4 mg/kg in sodium chloride 0.9 % Inject 280 mg into the vein.     traMADol (ULTRAM) 50 MG tablet TAKE ONE TABLET BY MOUTH EVERY 6 HOURS AS NEEDED FOR MODERATE PAIN 40 tablet 0   triamcinolone (KENALOG) 0.025 % ointment Apply a very thin layer to affected area every 2 days. 30 g 0   amLODipine (NORVASC) 2.5 MG tablet Take 1 tablet (2.5 mg total) by mouth daily. 90 tablet 1   FLUoxetine (PROZAC) 20 MG capsule Take 3 capsules (60 mg total) by mouth daily. 3 caps daily (Patient taking differently: Take 20 mg by mouth daily. 1 capsule daily) 270 capsule 1   rOPINIRole (REQUIP) 0.5 MG tablet Take 1 tablet (0.5 mg total) by mouth at bedtime. 90 tablet 3   mirtazapine (REMERON) 7.5 MG tablet TAKE 1 TABLET BY MOUTH AT BEDTIME. (Patient not taking: Reported on 10/02/2021) 90 tablet 1   No facility-administered medications prior to visit.    Allergies  Allergen Reactions   Atorvastatin Other (See Comments)    Severe headache   Oxycodone Itching    hallucinations   Statins    Advair Diskus [Fluticasone-Salmeterol] Other (See Comments)    Mouth sores   Sulfonamide Derivatives Rash   ROS neg/noncontributory except as noted HPI/below Less gas on probiotics MWF rather than daily      Objective:     BP 138/62 (BP Location: Right Arm, Patient Position: Sitting)   Pulse 78   Temp 98.7 F (37.1 C) (Temporal)   Ht '5\' 3"'$  (1.6 m)   Wt 193 lb 6 oz (87.7 kg)   SpO2 95%   BMI 34.25 kg/m  Wt Readings from Last 3 Encounters:  10/02/21 193  lb 6 oz (87.7 kg)  08/29/21 180 lb 4 oz (81.8 kg)  08/08/21 186 lb 4.6 oz (84.5 kg)    Physical Exam   Gen: WDWN NAD elderly wf-looks better HEENT: NCAT, conjunctiva not injected, sclera nonicteric NECK:  supple, no thyromegaly,  no nodes, no carotid bruits CARDIAC: RRR, S1S2+, no murmur. DP 2+B LUNGS: CTAB. No wheezes ABDOMEN:  BS+, soft, NTND, No HSM, no masses EXT: tr edema MSK: in wc  NEURO: A&O x3.  CN II-XII intact.  PSYCH: normal mood. Good eye contact     Assessment & Plan:   Problem List Items Addressed This Visit       Cardiovascular and Mediastinum   HYPERTENSION, BENIGN - Primary   Relevant Medications   amLODipine (NORVASC) 2.5 MG tablet     Digestive   Chronic constipation     Other   RLS (restless legs syndrome)   Cervicalgia   Other Visit Diagnoses     Recurrent UTI          HTN-chronic-better controlled w/the amlodipine 2.'5mg'$  added.  Continue-renewed. Chronic constipation-controlled on stool softener bid and oatmeal-continue RLS-chronic.  Not ideal.  Increase requip to '1mg'$  at hs.  Also some component of pain/neuropathy-intol gabapentin.  Taking tramadol q6-8 hrs.  Take the Mg Cervicalgia-chronic, better since back on the tramadol.  Seeing pain mgmt Recurrant UTI-currently on augmentin.  Followed by urology.   F/u 3 mo-htn, RLS  Meds ordered this encounter  Medications   rOPINIRole (REQUIP) 1 MG tablet    Sig: Take 1 tablet (1 mg total) by mouth at bedtime.    Dispense:  90 tablet    Refill:  1   amLODipine (NORVASC) 2.5 MG tablet    Sig: Take 1 tablet (2.5 mg total) by mouth daily.    Dispense:  90 tablet    Refill:  1    Wellington Hampshire, MD

## 2021-10-02 NOTE — Patient Instructions (Signed)
It was very nice to see you today!  Increase requip to '1mg'$ /day so ok to do 2 of the 0.'5mg'$ .   PLEASE NOTE:  If you had any lab tests please let us know if you have not heard back within a few days. You may see your results on MyChart before we have a chance to review them but we will give you a call once they are reviewed by [place]. If we ordered any referrals today, please let us know if you have not heard from their office within the next week.   Please try these tips to maintain a healthy lifestyle:  Eat most of your calories during the day when you are active. Eliminate processed foods including packaged sweets (pies, cakes, cookies), reduce intake of potatoes, white bread, white pasta, and white rice. Look for whole grain options, oat flour or almond flour.  Each meal should contain half fruits/vegetables, one quarter protein, and one quarter carbs (no bigger than a computer mouse).  Cut down on sweet beverages. This includes juice, soda, and sweet tea. Also watch fruit intake, though this is a healthier sweet option, it still contains natural sugar! Limit to 3 servings daily.  Drink at least 1 glass of water with each meal and aim for at least 8 glasses per day  Exercise at least 150 minutes every week.

## 2021-10-11 DIAGNOSIS — M316 Other giant cell arteritis: Secondary | ICD-10-CM | POA: Diagnosis not present

## 2021-10-15 ENCOUNTER — Other Ambulatory Visit: Payer: Self-pay | Admitting: Family Medicine

## 2021-10-15 DIAGNOSIS — I10 Essential (primary) hypertension: Secondary | ICD-10-CM

## 2021-10-17 DIAGNOSIS — N3020 Other chronic cystitis without hematuria: Secondary | ICD-10-CM | POA: Diagnosis not present

## 2021-10-30 ENCOUNTER — Other Ambulatory Visit: Payer: Self-pay | Admitting: *Deleted

## 2021-10-30 ENCOUNTER — Encounter: Payer: Self-pay | Admitting: *Deleted

## 2021-10-30 NOTE — Patient Outreach (Signed)
  Care Coordination   Initial Visit Note   10/30/2021 Name: [name] MRN: [id number] DOB: [birth year]  [name] is a 88 y.o. year old female who sees Sally Crook, MD for primary care. I spoke with  [name] by phone today  What matters to the patients health and wellness today?  No needs    Goals Addressed               This Visit's Progress     No needs (pt-stated)        Care Coordination Interventions: Reviewed medications with patient and discussed purpose of all medications Reviewed scheduled/upcoming provider appointments including pending appointments and verified pt has completed AWV on 06/28/2021 and pending her next AWV 07/11/2022 with her primary provider Screening for signs and symptoms of depression related to chronic disease state  Assessed social determinant of health barriers          SDOH assessments and interventions completed:  Yes  SDOH Interventions Today    Flowsheet Row Most Recent Value  SDOH Interventions   Food Insecurity Interventions Intervention Not Indicated  Housing Interventions Intervention Not Indicated  Transportation Interventions Intervention Not Indicated        Care Coordination Interventions Activated:  Yes  Care Coordination Interventions:  Yes, provided   Follow up plan: No further intervention required.   Encounter Outcome:  Pt. Visit Completed   Sally Mina, RN Care Management Coordinator [place] Office [phone]

## 2021-10-30 NOTE — Patient Instructions (Signed)
Visit Information  Thank you for taking time to visit with me today. Please don't hesitate to contact me if I can be of assistance to you.   Following are the goals we discussed today:   Goals Addressed               This Visit's Progress     No needs (pt-stated)        Care Coordination Interventions: Reviewed medications with patient and discussed purpose of all medications Reviewed scheduled/upcoming provider appointments including pending appointments and verified pt has completed AWV on 06/28/2021 and pending her next AWV 07/11/2022 with her primary provider Screening for signs and symptoms of depression related to chronic disease state  Assessed social determinant of health barriers            Please call the care guide team at [phone] if you need to cancel or reschedule your appointment.   If you are experiencing a Mental Health or [place] or need someone to talk to, please call the Suicide and Crisis Lifeline: 988  Patient verbalizes understanding of instructions and care plan provided today and agrees to view in [place]. Active MyChart status and patient understanding of how to access instructions and care plan via MyChart confirmed with patient.     No further follow up required: no needs presented at this time  Raina Mina, RN Care Management Coordinator [place] Office [phone]

## 2021-11-09 DIAGNOSIS — M316 Other giant cell arteritis: Secondary | ICD-10-CM | POA: Diagnosis not present

## 2021-11-19 ENCOUNTER — Other Ambulatory Visit: Payer: Self-pay | Admitting: Family Medicine

## 2021-11-19 MED ORDER — FLUOXETINE HCL 20 MG PO CAPS: 20.0000 mg | ORAL_CAPSULE | Freq: Every day | ORAL | 3 refills | Status: DC

## 2021-11-29 DIAGNOSIS — N3020 Other chronic cystitis without hematuria: Secondary | ICD-10-CM | POA: Diagnosis not present

## 2021-12-07 DIAGNOSIS — M316 Other giant cell arteritis: Secondary | ICD-10-CM | POA: Diagnosis not present

## 2021-12-10 ENCOUNTER — Encounter: Payer: Self-pay | Admitting: *Deleted

## 2021-12-21 ENCOUNTER — Emergency Department (HOSPITAL_BASED_OUTPATIENT_CLINIC_OR_DEPARTMENT_OTHER)
Admission: EM | Admit: 2021-12-21 | Discharge: 2021-12-21 | Disposition: A | Discharge status: Home / Self Care | Payer: Medicare Other | Attending: Emergency Medicine | Admitting: Emergency Medicine

## 2021-12-21 ENCOUNTER — Encounter (HOSPITAL_BASED_OUTPATIENT_CLINIC_OR_DEPARTMENT_OTHER): Payer: Self-pay

## 2021-12-21 ENCOUNTER — Other Ambulatory Visit: Payer: Self-pay

## 2021-12-21 ENCOUNTER — Emergency Department (HOSPITAL_BASED_OUTPATIENT_CLINIC_OR_DEPARTMENT_OTHER): Payer: Medicare Other

## 2021-12-21 DIAGNOSIS — I700 Atherosclerosis of aorta: Secondary | ICD-10-CM | POA: Diagnosis not present

## 2021-12-21 DIAGNOSIS — Z7982 Long term (current) use of aspirin: Secondary | ICD-10-CM | POA: Diagnosis not present

## 2021-12-21 DIAGNOSIS — K769 Liver disease, unspecified: Secondary | ICD-10-CM | POA: Diagnosis not present

## 2021-12-21 DIAGNOSIS — R109 Unspecified abdominal pain: Secondary | ICD-10-CM | POA: Diagnosis not present

## 2021-12-21 DIAGNOSIS — R1031 Right lower quadrant pain: Secondary | ICD-10-CM | POA: Insufficient documentation

## 2021-12-21 LAB — URINALYSIS, ROUTINE W REFLEX MICROSCOPIC
Bilirubin Urine: NEGATIVE
Glucose, UA: NEGATIVE mg/dL
Hgb urine dipstick: NEGATIVE
Ketones, ur: NEGATIVE mg/dL
Leukocytes,Ua: NEGATIVE
Nitrite: NEGATIVE
Protein, ur: 30 mg/dL — AB
Specific Gravity, Urine: <=1.005 (ref 1.005–1.030)
pH: 5.5 (ref 5.0–8.0)

## 2021-12-21 LAB — CBC WITH DIFFERENTIAL/PLATELET
Abs Immature Granulocytes: 0.04 10*3/uL (ref 0.00–0.07)
Basophils Absolute: 0.1 10*3/uL (ref 0.0–0.1)
Basophils Relative: 1 %
Eosinophils Absolute: 0.1 10*3/uL (ref 0.0–0.5)
Eosinophils Relative: 1 %
HCT: 46.7 % — ABNORMAL HIGH (ref 36.0–46.0)
Hemoglobin: 15.7 g/dL — ABNORMAL HIGH (ref 12.0–15.0)
Immature Granulocytes: 1 %
Lymphocytes Relative: 22 %
Lymphs Abs: 1.9 10*3/uL (ref 0.7–4.0)
MCH: 32.2 pg (ref 26.0–34.0)
MCHC: 33.6 g/dL (ref 30.0–36.0)
MCV: 95.9 fL (ref 80.0–100.0)
Monocytes Absolute: 0.7 10*3/uL (ref 0.1–1.0)
Monocytes Relative: 8 %
Neutro Abs: 6.1 10*3/uL (ref 1.7–7.7)
Neutrophils Relative %: 67 %
Platelets: 186 10*3/uL (ref 150–400)
RBC: 4.87 MIL/uL (ref 3.87–5.11)
RDW: 12.4 % (ref 11.5–15.5)
WBC: 8.9 10*3/uL (ref 4.0–10.5)
nRBC: 0 % (ref 0.0–0.2)

## 2021-12-21 LAB — COMPREHENSIVE METABOLIC PANEL
ALT: 26 U/L (ref 0–44)
AST: 30 U/L (ref 15–41)
Albumin: 3.7 g/dL (ref 3.5–5.0)
Alkaline Phosphatase: 55 U/L (ref 38–126)
Anion gap: 8 (ref 5–15)
BUN: 19 mg/dL (ref 8–23)
CO2: 25 mmol/L (ref 22–32)
Calcium: 9 mg/dL (ref 8.9–10.3)
Chloride: 105 mmol/L (ref 98–111)
Creatinine, Ser: 0.97 mg/dL (ref 0.44–1.00)
GFR, Estimated: 56 mL/min — ABNORMAL LOW (ref >60)
Glucose, Bld: 81 mg/dL (ref 70–99)
Potassium: 4 mmol/L (ref 3.5–5.1)
Sodium: 138 mmol/L (ref 135–145)
Total Bilirubin: 0.7 mg/dL (ref 0.3–1.2)
Total Protein: 6.3 g/dL — ABNORMAL LOW (ref 6.5–8.1)

## 2021-12-21 LAB — LIPASE, BLOOD: Lipase: 43 U/L (ref 11–51)

## 2021-12-21 LAB — URINALYSIS, MICROSCOPIC (REFLEX)

## 2021-12-21 MED ORDER — SODIUM CHLORIDE 0.9 % IV BOLUS
1000.0000 mL | Freq: Once | INTRAVENOUS | Status: AC
  Administered 2021-12-21: 1000 mL via INTRAVENOUS

## 2021-12-21 MED ORDER — IOHEXOL 300 MG/ML  SOLN
100.0000 mL | Freq: Once | INTRAMUSCULAR | Status: AC | PRN
Start: 2021-12-21 — End: 2021-12-21
  Administered 2021-12-21: 100 mL via INTRAVENOUS

## 2021-12-21 NOTE — ED Provider Notes (Signed)
[facility] EMERGENCY DEPARTMENT Provider Note   CSN: [id number] Arrival date & time: 12/21/21  1057     History  Chief Complaint  Patient presents with   Abdominal Pain    [name] is a 88 y.o. female.  88 yo F with a chief complaints of right-sided lower abdominal discomfort.  This has been an ongoing issue for her.  Per the family has been going on for years.  Worsening over the past 3 weeks.  No constipation no vomiting no fevers.  She takes pain medicine for this at home and took a pill before she came here and feels completely better.  Denies urinary symptoms.   Abdominal Pain      Home Medications Prior to Admission medications   Medication Sig Start Date End Date Taking? Authorizing Provider  albuterol (VENTOLIN HFA) 108 (90 Base) MCG/ACT inhaler USE 2 INHALATIONS UP TO EVERY 4 HOURS IF CANNOT CATCH YOUR BREATH 05/03/21   Tawnya Crook, MD  amLODipine (NORVASC) 2.5 MG tablet Take 1 tablet (2.5 mg total) by mouth daily. 10/02/21   Tawnya Crook, MD  amoxicillin-clavulanate (AUGMENTIN) 875-125 MG tablet Take 1 tablet by mouth 2 (two) times daily. 09/28/21   [provider]  aspirin EC 81 MG tablet Take 1 tablet (81 mg total) by mouth 2 (two) times daily. 01/11/19   Leandrew Koyanagi, MD  budesonide-formoterol [facility]) 160-4.5 MCG/ACT inhaler Inhale 2 puffs into the lungs 2 (two) times daily. 05/03/21   Tawnya Crook, MD  calcium-vitamin D (OSCAL WITH D) 500-200 MG-UNIT tablet Take 1 tablet by mouth 3 (three) times daily. 01/11/19   Leandrew Koyanagi, MD  carbamide peroxide (DEBROX) 6.5 % OTIC solution Use once a month to keep cerumen form building up. 07/23/19   Kuneff, Renee A, DO  cephALEXin (KEFLEX) 250 MG capsule Take 250 mg by mouth at bedtime. 03/30/21   [provider]  Cholecalciferol (VITAMIN D3) 20 MCG (800 UNIT) TABS Take 1 tablet by mouth daily. 08/19/19   Kuneff, Renee A, DO  Cyanocobalamin (VITAMIN B-12) 5000 MCG SUBL Place 0.0001  tablets (0.5 mcg total) under the tongue daily. Place 2500MCG under the tongue daily. 08/19/19   Kuneff, Renee A, DO  estradiol (ESTRACE) 0.1 MG/GM vaginal cream Place vaginally. 03/30/21   [provider]  FLUoxetine (PROZAC) 20 MG capsule Take 1 capsule (20 mg total) by mouth daily. 11/19/21   Tawnya Crook, MD  neomycin-colistin-hydrocortisone-thonzonium (CORTISPORIN-TC) 3.05-18-08-0.5 MG/ML OTIC suspension Place 3 drops into the right ear 4 (four) times daily. 08/17/20   Raoul Pitch, Renee A, DO  olmesartan (BENICAR) 40 MG tablet TAKE 1 TABLET DAILY 10/15/21   Tawnya Crook, MD  omega-3 acid ethyl esters (LOVAZA) 1 g capsule Take 2 capsules (2 g total) by mouth 2 (two) times daily. 05/03/21   Tawnya Crook, MD  ondansetron (ZOFRAN ODT) 8 MG disintegrating tablet Take 1 tablet (8 mg total) by mouth every 8 (eight) hours as needed for nausea or vomiting. 07/31/21   Tawnya Crook, MD  predniSONE (DELTASONE) 5 MG tablet Take 10 mg by mouth daily. 07/22/16   [provider]  Probiotic Product (PROBIOTIC PO) Take by mouth daily.    [provider]  RESTASIS 0.05 % ophthalmic emulsion 1 drop 2 (two) times daily. 04/05/20   [provider]  rOPINIRole (REQUIP) 1 MG tablet Take 1 tablet (1 mg total) by mouth at bedtime. 10/02/21   Tawnya Crook, MD  tocilizumab Ursula Beath)  400 MG/20ML SOLN injection  11/27/17   [provider]  tocilizumab 4 mg/kg in sodium chloride 0.9 % Inject 280 mg into the vein. 12/03/16   [provider]  traMADol (ULTRAM) 50 MG tablet TAKE ONE TABLET BY MOUTH EVERY 6 HOURS AS NEEDED FOR MODERATE PAIN 11/12/17   Jessy Oto, MD  triamcinolone (KENALOG) 0.025 % ointment Apply a very thin layer to affected area every 2 days. 09/24/17   Princess Bruins, MD      Allergies    Atorvastatin, Oxycodone, Statins, Advair diskus [fluticasone-salmeterol], and Sulfonamide derivatives    Review of Systems   Review of Systems  Gastrointestinal:   Positive for abdominal pain.    Physical Exam Updated Vital Signs BP (!) 140/59   Pulse 73   Temp 98 F (36.7 C)   Resp 18   Ht '5\' 3"'$  (1.6 m)   Wt 91.2 kg   SpO2 96%   BMI 35.61 kg/m  Physical Exam Vitals and nursing note reviewed.  Constitutional:      General: She is not in acute distress.    Appearance: She is well-developed. She is not diaphoretic.  HENT:     Head: Normocephalic and atraumatic.  Eyes:     Pupils: Pupils are equal, round, and reactive to light.  Cardiovascular:     Rate and Rhythm: Normal rate and regular rhythm.     Heart sounds: No murmur heard.    No friction rub. No gallop.  Pulmonary:     Effort: Pulmonary effort is normal.     Breath sounds: No wheezing or rales.  Abdominal:     General: There is no distension.     Palpations: Abdomen is soft.     Tenderness: There is no abdominal tenderness.  Musculoskeletal:        General: No tenderness.     Cervical back: Normal range of motion and neck supple.  Skin:    General: Skin is warm and dry.  Neurological:     Mental Status: She is alert and oriented to person, place, and time.  Psychiatric:        Behavior: Behavior normal.     ED Results / Procedures / Treatments   Labs (all labs ordered are listed, but only abnormal results are displayed) Labs Reviewed  CBC WITH DIFFERENTIAL/PLATELET - Abnormal; Notable for the following components:      Result Value   Hemoglobin 15.7 (*)    HCT 46.7 (*)    All other components within normal limits  COMPREHENSIVE METABOLIC PANEL - Abnormal; Notable for the following components:   Total Protein 6.3 (*)    GFR, Estimated 56 (*)    All other components within normal limits  URINALYSIS, ROUTINE W REFLEX MICROSCOPIC - Abnormal; Notable for the following components:   Protein, ur 30 (*)    All other components within normal limits  URINALYSIS, MICROSCOPIC (REFLEX) - Abnormal; Notable for the following components:   Bacteria, UA RARE (*)    All  other components within normal limits  LIPASE, BLOOD    EKG None  Radiology CT ABDOMEN PELVIS W CONTRAST  Result Date: 12/21/2021 CLINICAL DATA:  Right lower quadrant abdominal pain EXAM: CT ABDOMEN AND PELVIS WITH CONTRAST TECHNIQUE: Multidetector CT imaging of the abdomen and pelvis was performed using the standard protocol following bolus administration of intravenous contrast. RADIATION DOSE REDUCTION: This exam was performed according to the departmental dose-optimization program which includes automated exposure control, adjustment of the mA and/or  kV according to patient size and/or use of iterative reconstruction technique. CONTRAST:  139m OMNIPAQUE IOHEXOL 300 MG/ML  SOLN COMPARISON:  CT abdomen and pelvis 08/09/2018 FINDINGS: Lower chest: No acute abnormality. Hepatobiliary: Liver has a normal contour. There are multiple hypodense liver lesions, which are too small to accurately characterize. No evidence of intra or extrahepatic biliary ductal dilatation. The gallbladder is unchanged in appearance from prior without evidence of cholelithiasis. No evidence of perihepatic fluid. Pancreas: Unremarkable. No pancreatic ductal dilatation or surrounding inflammatory changes. Spleen: Normal in size without focal abnormality. Adrenals/Urinary Tract: Bilateral kidneys have a lobulated contour. There is no evidence of hydronephrosis or nephrolithiasis. The urinary bladder is fluid-filled without focal wall thickening. Bilateral adrenal glands are normal in appearance. Stomach/Bowel: The stomach, small bowel large bowel are normal in caliber. No evidence of bowel obstruction. There is diverticulosis without diverticulitis. The appendix is normal appearance. Vascular/Lymphatic: Aortic atherosclerosis. No enlarged abdominal or pelvic lymph nodes. There is also atherosclerotic plaque in the SMA in the mid segment as well as more distally and the SMA branches extending to the right lower quadrant (series 2,  image 38). Reproductive: Status post hysterectomy. No adnexal masses. Other: No abdominal wall hernia or abnormality. No abdominopelvic ascites. Musculoskeletal: Patient is status post L3-S1 posterior spinal fusion and decompression. Assessment of the spinal Acosta is limited due to the degree of Street artifact from fusion hardware. IMPRESSION: 1. No definite finding to explain right lower quadrant pain. 2. Aortic atherosclerosis with atherosclerotic plaque in the SMA in the mid segment as well as more distally and the SMA branches extending to the right lower quadrant. No evidence of end-organ ischemia at the time of the exam. Aortic Atherosclerosis (ICD10-I70.0). Electronically Signed   By: HMarin RobertsM.D.   On: 12/21/2021 12:57    Procedures Procedures    Medications Ordered in ED Medications  sodium chloride 0.9 % bolus 1,000 mL (0 mLs Intravenous Stopped 12/21/21 1302)  iohexol (OMNIPAQUE) 300 MG/ML solution 100 mL (100 mLs Intravenous Contrast Given 12/21/21 1218)    ED Course/ Medical Decision Making/ A&P                           Medical Decision Making Amount and/or Complexity of Data Reviewed Labs: ordered. Radiology: ordered.  Risk Prescription drug management.   88yo F with a chief complaint of right-sided abdominal pain.  This has been an ongoing issue for her.  Was seen in May with similar symptoms and had negative CT imaging.  I discussed risk and benefits of repeat imaging and blood work here.  Electing for continued work-up.  Reassess.  Lab work without significant finding no leukocytosis no anemia LFTs and lipase are unremarkable.  CT scan of the abdomen pelvis with atherosclerotic disease with some extension through the SMA and into minor branches down into the right lower quadrant.  No signs of colitis.  I discussed the case with Dr. RVirl Cagey vascular.  Thought most likely to be a chronic issue.  Recommended follow-up in the clinic as an outpatient.  He did  recommend statin and baby aspirin.  The patient is already on a baby aspirin and unfortunately has an intolerance to statin therapy.  UA negative for infection.  2:34 PM:  I have discussed the diagnosis/risks/treatment options with the patient and family.  Evaluation and diagnostic testing in the emergency department does not suggest an emergent condition requiring admission or immediate intervention beyond what has been  performed at this time.  They will follow up with PCP, vascular. We also discussed returning to the ED immediately if new or worsening sx occur. We discussed the sx which are most concerning (e.g., sudden worsening pain, fever, inability to tolerate by mouth) that necessitate immediate return. Medications administered to the patient during their visit and any new prescriptions provided to the patient are listed below.  Medications given during this visit Medications  sodium chloride 0.9 % bolus 1,000 mL (0 mLs Intravenous Stopped 12/21/21 1302)  iohexol (OMNIPAQUE) 300 MG/ML solution 100 mL (100 mLs Intravenous Contrast Given 12/21/21 1218)     The patient appears reasonably screen and/or stabilized for discharge and I doubt any other medical condition or other [facility] requiring further screening, evaluation, or treatment in the ED at this time prior to discharge.          Final Clinical Impression(s) / ED Diagnoses Final diagnoses:  RLQ abdominal pain    Rx / DC Orders ED Discharge Orders     None         Deno Etienne, DO 12/21/21 1434

## 2021-12-21 NOTE — ED Triage Notes (Signed)
Pt brought in by daughter. Pt reports right lower abdominal pain for "weeks" Pain increased last night and had difficulty sleeping. Pt given tramadol at 9 am. Nauseated Pt urinated while in lobby prior to be called to room

## 2021-12-21 NOTE — Discharge Instructions (Signed)
Your CT scan showed that you had signs of atherosclerotic disease that extends into the arteries that go to your colon.  No signs of obvious injury to your colon.  I discussed this with the vascular surgeon who thought best to see you in the office for this.  He recommended having you on a baby aspirin which you are already on and trying a statin which it looks like you were unable to take based on your allergy list.  Your urine did not show any signs of infection.  Please return for worsening pain fever or inability eat or drink.

## 2021-12-21 NOTE — ED Notes (Signed)
Pt discharged home with daughter per MD order. Discharge summary reviewed, pt verbalizes understanding. Off unit via WC; no s/s of acute distress noted at discharge.

## 2021-12-21 NOTE — ED Notes (Signed)
Patient transported to CT 

## 2021-12-26 ENCOUNTER — Other Ambulatory Visit: Payer: Self-pay

## 2021-12-26 DIAGNOSIS — K551 Chronic vascular disorders of intestine: Secondary | ICD-10-CM

## 2021-12-27 NOTE — Progress Notes (Signed)
Office Note    CC:  Chronic Abdominal Pain  Requesting Provider:  Tawnya Crook, MD  HPI: [name] is a 88 y.o. (1929/11/01) female presenting at the request of [facility] emergency department with a several year history of right lower quadrant abdominal pain which waxes and wanes in intensity.  She was seen in the ED with CT scan demonstrating a atherosclerotic disease throughout her aorta and mesenteric vasculature.  There was concern this could be a contributing factor to her abdominal pain.  She presents today in follow-up.  On exam, [name] was doing well, accompanied by her daughter.  Hard of hearing, she noted no abdominal pain on today's visit.  She denied postprandial pain, weight loss.  She described the pain as intermittent, without a trigger.  Normal urination, normal bowel movements.  Denied dysphagia.  Currently medicated with tramadol which significantly helps symptoms when present.  Past Medical History:  Diagnosis Date   Abdominal bloating    Abnormal LFTs (liver function tests)    Ankle fracture, bimalleolar, closed, right, initial encounter    Anxiety disorder    Arthritis    Asthma    Bimalleolar ankle fracture, right, closed, initial encounter 01/06/2019   Depression    Diverticulosis    Edema    Fatty liver    Fibromyalgia    GERD (gastroesophageal reflux disease)    Giant cell arteritis (HCC)    No biopsy secondary to steroid use   Hx of adenomatous colonic polyps    Hyperlipidemia    Hypertension    IBS (irritable bowel syndrome)    Lumbar radiculitis    Osteoporosis 09/2017   T score -2.6 left femoral neck   Peptic ulcer disease    Skin cancer    shoulder, leg, right eye (? squamous by resection)   Thoracic radiculitis    Thyroid nodule    "removed"   Urinary incontinence    Vertigo     Past Surgical History:  Procedure Laterality Date   ABDOMINAL HYSTERECTOMY     BSO   BACK SURGERY  2005   lower back   EYE SURGERY Right 2006    LIVER BIOPSY     fatty liver   NASAL SEPTUM SURGERY     ORIF ANKLE FRACTURE Right 01/11/2019   Procedure: OPEN REDUCTION INTERNAL FIXATION (ORIF) RIGHT BIMALLEOLAR ANKLE FRACTURE;  Surgeon: Leandrew Koyanagi, MD;  Location: [facility];  Service: Orthopedics;  Laterality: Right;   right knee surgery  2006   arthroscopy   right leg skin cancer surgery  2014   right shoulder skin cancer excision  2012   SKIN CANCER EXCISION     THYROIDECTOMY      Social History   Socioeconomic History   Marital status: Widowed    Spouse name: Not on file   Number of children: 3   Years of education: Not on file   Highest education level: Not on file  Occupational History   Occupation: Retired Water engineer  Tobacco Use   Smoking status: Never   Smokeless tobacco: Never  Vaping Use   Vaping Use: Never used  Substance and Sexual Activity   Alcohol use: No   Drug use: No   Sexual activity: Not Currently    Comment: intercourser age 12, less than 5 secxual partners  Other Topics Concern   Not on file  Social History Narrative      children [name] and Anita-pt lives w/Gary, but others are  neighbors.   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   Patient takes a daily vitamin   She wears her seat belt. Exercises greater than 3 times a week.   Wears 2 hearing aids. No dentures.   "Sometimes "requires assistive device for walking, either a walker or wheelchair.   There is a smoke detector in her home. There are firearms in her home. She feels safe in her relationships.   Right handed   Social Determinants of Health   Financial Resource Strain: Low Risk  (06/28/2021)   Overall Financial Resource Strain (CARDIA)    Difficulty of Paying Living Expenses: Not hard at all  Food Insecurity: No Food Insecurity (10/30/2021)   Hunger Vital Sign    Worried About Running Out of Food in the Last Year: Never true    Ran Out of Food in the Last Year: Never true  Transportation Needs: No  Transportation Needs (10/30/2021)   PRAPARE - Hydrologist (Medical): No    Lack of Transportation (Non-Medical): No  Physical Activity: Insufficiently Active (06/28/2021)   Exercise Vital Sign    Days of Exercise per Week: 5 days    Minutes of Exercise per Session: 10 min  Stress: No Stress Concern Present (06/28/2021)   [place]    Feeling of Stress : Not at all  Social Connections: Socially Isolated (06/28/2021)   Social Connection and Isolation Panel [NHANES]    Frequency of Communication with Friends and Family: Never    Frequency of Social Gatherings with Friends and Family: Never    Attends Religious Services: Never    Marine scientist or Organizations: No    Attends Archivist Meetings: Never    Marital Status: Widowed  Intimate Partner Violence: Not At Risk (06/28/2021)   Humiliation, Afraid, Rape, and Kick questionnaire    Fear of Current or Ex-Partner: No    Emotionally Abused: No    Physically Abused: No    Sexually Abused: No   Family History  Problem Relation Age of Onset   Colon polyps Maternal Aunt    Diabetes Father    Heart disease Father    Heart disease Mother    Lung disease Mother    COPD Mother    Lung cancer Brother    Cancer Brother        lung   COPD Brother    Stroke Brother    Lung cancer Sister    COPD Sister    Kidney disease Other        niece-mat side   Arthritis Other    Diabetes Son    Arthritis Daughter    Colon cancer Neg Hx     Current Outpatient Medications  Medication Sig Dispense Refill   albuterol (VENTOLIN HFA) 108 (90 Base) MCG/ACT inhaler USE 2 INHALATIONS UP TO EVERY 4 HOURS IF CANNOT CATCH YOUR BREATH 18 g 1   amLODipine (NORVASC) 2.5 MG tablet Take 1 tablet (2.5 mg total) by mouth daily. 90 tablet 1   amoxicillin-clavulanate (AUGMENTIN) 875-125 MG tablet Take 1 tablet by mouth 2 (two) times daily.     aspirin EC  81 MG tablet Take 1 tablet (81 mg total) by mouth 2 (two) times daily. 84 tablet 0   budesonide-formoterol (SYMBICORT) 160-4.5 MCG/ACT inhaler Inhale 2 puffs into the lungs 2 (two) times daily. 3 each 3   calcium-vitamin D (OSCAL WITH D) 500-200 MG-UNIT tablet Take 1 tablet by  mouth 3 (three) times daily. 90 tablet 6   carbamide peroxide (DEBROX) 6.5 % OTIC solution Use once a month to keep cerumen form building up. 15 mL 0   cephALEXin (KEFLEX) 250 MG capsule Take 250 mg by mouth at bedtime.     Cholecalciferol (VITAMIN D3) 20 MCG (800 UNIT) TABS Take 1 tablet by mouth daily. 90 tablet 3   Cyanocobalamin (VITAMIN B-12) 5000 MCG SUBL Place 0.0001 tablets (0.5 mcg total) under the tongue daily. Place 2500MCG under the tongue daily. 1 tablet 11   estradiol (ESTRACE) 0.1 MG/GM vaginal cream Place vaginally.     FLUoxetine (PROZAC) 20 MG capsule Take 1 capsule (20 mg total) by mouth daily. 90 capsule 3   neomycin-colistin-hydrocortisone-thonzonium (CORTISPORIN-TC) 3.05-18-08-0.5 MG/ML OTIC suspension Place 3 drops into the right ear 4 (four) times daily. 10 mL 0   olmesartan (BENICAR) 40 MG tablet TAKE 1 TABLET DAILY 90 tablet 3   omega-3 acid ethyl esters (LOVAZA) 1 g capsule Take 2 capsules (2 g total) by mouth 2 (two) times daily. 360 capsule 3   ondansetron (ZOFRAN ODT) 8 MG disintegrating tablet Take 1 tablet (8 mg total) by mouth every 8 (eight) hours as needed for nausea or vomiting. 30 tablet 3   predniSONE (DELTASONE) 5 MG tablet Take 10 mg by mouth daily.     Probiotic Product (PROBIOTIC PO) Take by mouth daily.     RESTASIS 0.05 % ophthalmic emulsion 1 drop 2 (two) times daily.     rOPINIRole (REQUIP) 1 MG tablet Take 1 tablet (1 mg total) by mouth at bedtime. 90 tablet 1   tocilizumab (ACTEMRA) 400 MG/20ML SOLN injection      tocilizumab 4 mg/kg in sodium chloride 0.9 % Inject 280 mg into the vein.     traMADol (ULTRAM) 50 MG tablet TAKE ONE TABLET BY MOUTH EVERY 6 HOURS AS NEEDED FOR  MODERATE PAIN 40 tablet 0   triamcinolone (KENALOG) 0.025 % ointment Apply a very thin layer to affected area every 2 days. 30 g 0   No current facility-administered medications for this visit.    Allergies  Allergen Reactions   Atorvastatin Other (See Comments)    Severe headache   Oxycodone Itching    hallucinations   Statins    Advair Diskus [Fluticasone-Salmeterol] Other (See Comments)    Mouth sores   Sulfonamide Derivatives Rash     REVIEW OF SYSTEMS:  '[X]'$  denotes positive finding, '[ ]'$  denotes negative finding Cardiac  Comments:  Chest pain or chest pressure:    Shortness of breath upon exertion:    Short of breath when lying flat:    Irregular heart rhythm:        Vascular    Pain in calf, thigh, or hip brought on by ambulation:    Pain in feet at night that wakes you up from your sleep:     Blood clot in your veins:    Leg swelling:         Pulmonary    Oxygen at home:    Productive cough:     Wheezing:         Neurologic    Sudden weakness in arms or legs:     Sudden numbness in arms or legs:     Sudden onset of difficulty speaking or slurred speech:    Temporary loss of vision in one eye:     Problems with dizziness:         Gastrointestinal  Blood in stool:     Vomited blood:         Genitourinary    Burning when urinating:     Blood in urine:        Psychiatric    Major depression:         Hematologic    Bleeding problems:    Problems with blood clotting too easily:        Skin    Rashes or ulcers:        Constitutional    Fever or chills:      PHYSICAL EXAMINATION:  There were no vitals filed for this visit.  General:  WDWN in NAD; vital signs documented above Gait: Not observed HENT: WNL, normocephalic Pulmonary: normal non-labored breathing Cardiac: regular HR Abdomen: soft, NT, no masses Skin: without rashes Vascular Exam/Pulses:  Right Left  Radial {Exam; arterial pulse strength 0-4:30167} {Exam; arterial pulse  strength 0-4:30167}  Ulnar {Exam; arterial pulse strength 0-4:30167} {Exam; arterial pulse strength 0-4:30167}  Femoral {Exam; arterial pulse strength 0-4:30167} {Exam; arterial pulse strength 0-4:30167}  Popliteal {Exam; arterial pulse strength 0-4:30167} {Exam; arterial pulse strength 0-4:30167}  DP {Exam; arterial pulse strength 0-4:30167} {Exam; arterial pulse strength 0-4:30167}  PT {Exam; arterial pulse strength 0-4:30167} {Exam; arterial pulse strength 0-4:30167}   Extremities: {With/Without:20273} ischemic changes, {With/Without:20273} Gangrene , {With/Without:20273} cellulitis; {With/Without:20273} open wounds;  Musculoskeletal: no muscle wasting or atrophy  Neurologic: A&O X 3;  No focal weakness or paresthesias are detected Psychiatric:  The pt has {Desc; normal/abnormal:11317::"Normal"} affect.   Non-Invasive Vascular Imaging:   ***    ASSESSMENT/PLAN: [name] is a 88 y.o. female presenting with ***   ***   Broadus John, MD [facility] [phone]

## 2021-12-28 ENCOUNTER — Ambulatory Visit (INDEPENDENT_AMBULATORY_CARE_PROVIDER_SITE_OTHER): Payer: Medicare Other | Admitting: Vascular Surgery

## 2021-12-28 ENCOUNTER — Ambulatory Visit (HOSPITAL_COMMUNITY)
Admission: RE | Admit: 2021-12-28 | Discharge: 2021-12-28 | Disposition: A | Discharge status: Home / Self Care | Payer: Medicare Other | Source: Ambulatory Visit | Attending: Cardiology | Admitting: Cardiology

## 2021-12-28 VITALS — BP 161/84 | HR 69 | Temp 98.0°F | Resp 20 | Ht 63.0 in | Wt 201.0 lb

## 2021-12-28 DIAGNOSIS — R1031 Right lower quadrant pain: Secondary | ICD-10-CM

## 2021-12-28 DIAGNOSIS — K551 Chronic vascular disorders of intestine: Secondary | ICD-10-CM | POA: Diagnosis not present

## 2022-01-03 ENCOUNTER — Ambulatory Visit (INDEPENDENT_AMBULATORY_CARE_PROVIDER_SITE_OTHER): Payer: Medicare Other | Admitting: Family Medicine

## 2022-01-03 ENCOUNTER — Encounter: Payer: Self-pay | Admitting: Family Medicine

## 2022-01-03 VITALS — BP 130/60 | HR 71 | Temp 98.7°F | Ht 63.0 in | Wt 197.5 lb

## 2022-01-03 DIAGNOSIS — Z23 Encounter for immunization: Secondary | ICD-10-CM

## 2022-01-03 DIAGNOSIS — G2581 Restless legs syndrome: Secondary | ICD-10-CM | POA: Diagnosis not present

## 2022-01-03 DIAGNOSIS — M542 Cervicalgia: Secondary | ICD-10-CM

## 2022-01-03 DIAGNOSIS — I10 Essential (primary) hypertension: Secondary | ICD-10-CM

## 2022-01-03 DIAGNOSIS — F418 Other specified anxiety disorders: Secondary | ICD-10-CM

## 2022-01-03 MED ORDER — DIAZEPAM 5 MG PO TABS: 5.0000 mg | ORAL_TABLET | Freq: Every evening | ORAL | 1 refills | Status: DC | PRN

## 2022-01-03 MED ORDER — ROPINIROLE HCL 1 MG PO TABS: 1.0000 mg | ORAL_TABLET | Freq: Three times a day (TID) | ORAL | 1 refills | Status: DC

## 2022-01-03 NOTE — Patient Instructions (Addendum)
It was very nice to see you today!  Desitin on bottom  Restless legs-'1mg'$  in the am and 2 mg at bed.   Call urologist about bladder and pyridium   PLEASE NOTE:  If you had any lab tests please let us know if you have not heard back within a few days. You may see your results on MyChart before we have a chance to review them but we will give you a call once they are reviewed by [place]. If we ordered any referrals today, please let us know if you have not heard from their office within the next week.   Please try these tips to maintain a healthy lifestyle:  Eat most of your calories during the day when you are active. Eliminate processed foods including packaged sweets (pies, cakes, cookies), reduce intake of potatoes, white bread, white pasta, and white rice. Look for whole grain options, oat flour or almond flour.  Each meal should contain half fruits/vegetables, one quarter protein, and one quarter carbs (no bigger than a computer mouse).  Cut down on sweet beverages. This includes juice, soda, and sweet tea. Also watch fruit intake, though this is a healthier sweet option, it still contains natural sugar! Limit to 3 servings daily.  Drink at least 1 glass of water with each meal and aim for at least 8 glasses per day  Exercise at least 150 minutes every week.

## 2022-01-03 NOTE — Progress Notes (Signed)
Subjective:     Patient ID: [name], female    DOB: 1929/12/19, 88 y.o.   MRN: [id number]  Chief Complaint  Patient presents with   Follow-up    3 month follow-up    Bladder pain    Pain and burning in bladder area, checked at hospital last week, no UTI    HPI-here w/daughter  HTN-Pt is on amlodipine 2.'5mg'$  and olmesartan '40mg'$ .  Bp's running  130/60-70.  No ha/dizziness/cp/palp/.  Chronic edema.  No worsening sob  RLS-on requip '1mg'$ -occ gives in am as well.  Still legs "jumping" a lot Neck pain-taking tramadol 2-3/day-helps rest for 2 hrs.  Valium at night and sleeps better-separates the 2 by 4-6 hrs. .  Still bladder pain-like "half-moon" pelvis.  Dysuria-chronic.  Estradiol starting to burn. Miserable.  Went to ER for RLQ pain-w/u neg.  Resolved.   Anx/depression-prozac qod and ok.  Weaning still.   There are no preventive care reminders to display for this patient.   Past Medical History:  Diagnosis Date   Abdominal bloating    Abnormal LFTs (liver function tests)    Ankle fracture, bimalleolar, closed, right, initial encounter    Anxiety disorder    Arthritis    Asthma    Bimalleolar ankle fracture, right, closed, initial encounter 01/06/2019   Depression    Diverticulosis    Edema    Fatty liver    Fibromyalgia    GERD (gastroesophageal reflux disease)    Giant cell arteritis (HCC)    No biopsy secondary to steroid use   Hx of adenomatous colonic polyps    Hyperlipidemia    Hypertension    IBS (irritable bowel syndrome)    Lumbar radiculitis    Osteoporosis 09/2017   T score -2.6 left femoral neck   Peptic ulcer disease    Skin cancer    shoulder, leg, right eye (? squamous by resection)   Thoracic radiculitis    Thyroid nodule    "removed"   Urinary incontinence    Vertigo     Past Surgical History:  Procedure Laterality Date   ABDOMINAL HYSTERECTOMY     BSO   BACK SURGERY  2005   lower back   EYE SURGERY Right 2006   LIVER BIOPSY      fatty liver   NASAL SEPTUM SURGERY     ORIF ANKLE FRACTURE Right 01/11/2019   Procedure: OPEN REDUCTION INTERNAL FIXATION (ORIF) RIGHT BIMALLEOLAR ANKLE FRACTURE;  Surgeon: Leandrew Koyanagi, MD;  Location: [facility];  Service: Orthopedics;  Laterality: Right;   right knee surgery  2006   arthroscopy   right leg skin cancer surgery  2014   right shoulder skin cancer excision  2012   SKIN CANCER EXCISION     THYROIDECTOMY      Outpatient Medications Prior to Visit  Medication Sig Dispense Refill   albuterol (VENTOLIN HFA) 108 (90 Base) MCG/ACT inhaler USE 2 INHALATIONS UP TO EVERY 4 HOURS IF CANNOT CATCH YOUR BREATH 18 g 1   amLODipine (NORVASC) 2.5 MG tablet Take 1 tablet (2.5 mg total) by mouth daily. 90 tablet 1   aspirin EC 81 MG tablet Take 1 tablet (81 mg total) by mouth 2 (two) times daily. 84 tablet 0   budesonide-formoterol (SYMBICORT) 160-4.5 MCG/ACT inhaler Inhale 2 puffs into the lungs 2 (two) times daily. 3 each 3   calcium-vitamin D (OSCAL WITH D) 500-200 MG-UNIT tablet Take 1 tablet by mouth 3 (three) times daily. [place]  tablet 6   cephALEXin (KEFLEX) 250 MG capsule Take 250 mg by mouth at bedtime.     Cholecalciferol (VITAMIN D3) 20 MCG (800 UNIT) TABS Take 1 tablet by mouth daily. 90 tablet 3   estradiol (ESTRACE) 0.1 MG/GM vaginal cream Place vaginally.     FLUoxetine (PROZAC) 20 MG capsule Take 1 capsule (20 mg total) by mouth daily. 90 capsule 3   neomycin-colistin-hydrocortisone-thonzonium (CORTISPORIN-TC) 3.05-18-08-0.5 MG/ML OTIC suspension Place 3 drops into the right ear 4 (four) times daily. 10 mL 0   olmesartan (BENICAR) 40 MG tablet TAKE 1 TABLET DAILY 90 tablet 3   omega-3 acid ethyl esters (LOVAZA) 1 g capsule Take 2 capsules (2 g total) by mouth 2 (two) times daily. 360 capsule 3   predniSONE (DELTASONE) 5 MG tablet Take 10 mg by mouth daily.     Probiotic Product (PROBIOTIC PO) Take by mouth daily.     RESTASIS 0.05 % ophthalmic emulsion 1 drop 2 (two)  times daily.     tocilizumab (ACTEMRA) 400 MG/20ML SOLN injection      tocilizumab 4 mg/kg in sodium chloride 0.9 % Inject 280 mg into the vein.     traMADol (ULTRAM) 50 MG tablet TAKE ONE TABLET BY MOUTH EVERY 6 HOURS AS NEEDED FOR MODERATE PAIN 40 tablet 0   diazepam (VALIUM) 5 MG tablet Take by mouth.     rOPINIRole (REQUIP) 1 MG tablet Take 1 tablet (1 mg total) by mouth at bedtime. 90 tablet 1   No facility-administered medications prior to visit.    Allergies  Allergen Reactions   Atorvastatin Other (See Comments)    Severe headache   Oxycodone Itching    hallucinations   Statins    Advair Diskus [Fluticasone-Salmeterol] Other (See Comments)    Mouth sores   Sulfonamide Derivatives Rash   ROS neg/noncontributory except as noted HPI/below      Objective:     BP 130/60   Pulse 71   Temp 98.7 F (37.1 C) (Temporal)   Ht '5\' 3"'$  (1.6 m)   Wt 197 lb 8 oz (89.6 kg)   SpO2 92%   BMI 34.99 kg/m  Wt Readings from Last 3 Encounters:  01/03/22 197 lb 8 oz (89.6 kg)  12/28/21 201 lb (91.2 kg)  12/21/21 201 lb (91.2 kg)    Physical Exam   Gen: WDWN NAD HEENT: NCAT, conjunctiva not injected, sclera nonicteric NECK:  supple, no thyromegaly, no nodes, no carotid bruits CARDIAC: RRR, S1S2+, no murmur. DP 2+B LUNGS: CTAB. No wheezes ABDOMEN:  BS+, soft, NTND, No HSM, no masses EXT: tr edema MSK: wc  NEURO: A&O x3.  CN II-XII intact.  PSYCH: normal mood. Good eye contact  Pdmp checked     Assessment & Plan:   Problem List Items Addressed This Visit       Cardiovascular and Mediastinum   HYPERTENSION, BENIGN - Primary     Other   Depression with anxiety   Relevant Medications   diazepam (VALIUM) 5 MG tablet   RLS (restless legs syndrome)   Cervicalgia   Other Visit Diagnoses     Need for immunization against influenza       Relevant Orders   Flu Vaccine QUAD High Dose(Fluad) (Completed)      HTN-chronic. Controlled  Cont amlodipine 2.'5mg'$  and olmesartan  '40mg'$ .  reviewed labs from 12/21/21 RLS-chronic.  Not controlled.  Will inc requip to '1mg'$  in am and 2 mg in pm Cervicalgia-chronic.  Taking tramadol from pain doc.  Helps Anxiety/depression-doing well w/weaning prozac.  Still needs valium '5mg'$  at hs.  Aware of risks in elderly, but want to keep pt comfortable.   Dysuria-UA neg in ER.  Try desitin.  Advised to call urol.  Meds ordered this encounter  Medications   rOPINIRole (REQUIP) 1 MG tablet    Sig: Take 1 tablet (1 mg total) by mouth 3 (three) times daily. Take 1 tab in am and 2 at night    Dispense:  270 tablet    Refill:  1   diazepam (VALIUM) 5 MG tablet    Sig: Take 1 tablet (5 mg total) by mouth at bedtime as needed for anxiety.    Dispense:  90 tablet    Refill:  1    Wellington Hampshire, MD

## 2022-01-04 DIAGNOSIS — M316 Other giant cell arteritis: Secondary | ICD-10-CM | POA: Diagnosis not present

## 2022-01-04 DIAGNOSIS — Z79899 Other long term (current) drug therapy: Secondary | ICD-10-CM | POA: Diagnosis not present

## 2022-01-04 DIAGNOSIS — M2550 Pain in unspecified joint: Secondary | ICD-10-CM | POA: Diagnosis not present

## 2022-01-09 ENCOUNTER — Other Ambulatory Visit: Payer: Self-pay | Admitting: Family Medicine

## 2022-01-09 DIAGNOSIS — E781 Pure hyperglyceridemia: Secondary | ICD-10-CM

## 2022-02-05 DIAGNOSIS — M316 Other giant cell arteritis: Secondary | ICD-10-CM | POA: Diagnosis not present

## 2022-03-05 DIAGNOSIS — M316 Other giant cell arteritis: Secondary | ICD-10-CM | POA: Diagnosis not present

## 2022-03-28 DIAGNOSIS — N3020 Other chronic cystitis without hematuria: Secondary | ICD-10-CM | POA: Diagnosis not present

## 2022-03-28 DIAGNOSIS — N3946 Mixed incontinence: Secondary | ICD-10-CM | POA: Diagnosis not present

## 2022-03-28 DIAGNOSIS — N952 Postmenopausal atrophic vaginitis: Secondary | ICD-10-CM | POA: Diagnosis not present

## 2022-04-02 ENCOUNTER — Encounter: Payer: Self-pay | Admitting: Family Medicine

## 2022-04-02 ENCOUNTER — Ambulatory Visit (INDEPENDENT_AMBULATORY_CARE_PROVIDER_SITE_OTHER): Payer: Medicare Other | Admitting: Family Medicine

## 2022-04-02 VITALS — BP 140/68 | HR 79 | Temp 98.3°F | Ht 63.0 in | Wt 198.0 lb

## 2022-04-02 DIAGNOSIS — I10 Essential (primary) hypertension: Secondary | ICD-10-CM | POA: Diagnosis not present

## 2022-04-02 DIAGNOSIS — G2581 Restless legs syndrome: Secondary | ICD-10-CM | POA: Diagnosis not present

## 2022-04-02 DIAGNOSIS — F418 Other specified anxiety disorders: Secondary | ICD-10-CM

## 2022-04-02 DIAGNOSIS — R5382 Chronic fatigue, unspecified: Secondary | ICD-10-CM | POA: Diagnosis not present

## 2022-04-02 DIAGNOSIS — D508 Other iron deficiency anemias: Secondary | ICD-10-CM

## 2022-04-02 DIAGNOSIS — R4181 Age-related cognitive decline: Secondary | ICD-10-CM

## 2022-04-02 LAB — CBC
HCT: 47.4 % — ABNORMAL HIGH (ref 36.0–46.0)
Hemoglobin: 16 g/dL — ABNORMAL HIGH (ref 12.0–15.0)
MCHC: 33.8 g/dL (ref 30.0–36.0)
MCV: 98.3 fl (ref 78.0–100.0)
Platelets: 185 10*3/uL (ref 150.0–400.0)
RBC: 4.82 Mil/uL (ref 3.87–5.11)
RDW: 13.1 % (ref 11.5–15.5)
WBC: 8.8 10*3/uL (ref 4.0–10.5)

## 2022-04-02 LAB — IBC + FERRITIN
Ferritin: 38.2 ng/mL (ref 10.0–291.0)
Iron: 142 ug/dL (ref 42–145)
Saturation Ratios: 36.1 % (ref 20.0–50.0)
TIBC: 393.4 ug/dL (ref 250.0–450.0)
Transferrin: 281 mg/dL (ref 212.0–360.0)

## 2022-04-02 LAB — TSH: TSH: 1.18 u[IU]/mL (ref 0.35–5.50)

## 2022-04-02 LAB — VITAMIN B12: Vitamin B-12: 299 pg/mL (ref 211–911)

## 2022-04-02 MED ORDER — BUDESONIDE-FORMOTEROL FUMARATE 160-4.5 MCG/ACT IN AERO: 2.0000 | INHALATION_SPRAY | Freq: Two times a day (BID) | RESPIRATORY_TRACT | 3 refills | Status: DC

## 2022-04-02 MED ORDER — SERTRALINE HCL 25 MG PO TABS: 25.0000 mg | ORAL_TABLET | Freq: Every day | ORAL | 3 refills | Status: DC

## 2022-04-02 MED ORDER — AMLODIPINE BESYLATE 2.5 MG PO TABS: 2.5000 mg | ORAL_TABLET | Freq: Every day | ORAL | 3 refills | Status: DC

## 2022-04-02 MED ORDER — MEMANTINE HCL 5 MG PO TABS: 5.0000 mg | ORAL_TABLET | Freq: Two times a day (BID) | ORAL | 1 refills | Status: DC

## 2022-04-02 NOTE — Progress Notes (Signed)
Subjective:     Patient ID: [name], female    DOB: Dec 26, 1929, 89 y.o.   MRN: [id number]  Chief Complaint  Patient presents with   Follow-up    3 month follow-up for HTN and RLS Having trouble sleeping at night    HPI-here w/daughter  HTN-Pt is on amlodipine 2.'5mg'$  and benicar '40mg'$ .  Bp's running  120-130/60-70.  No ha/dizziness/cp/palp.  Has a little chronic edema and sob.  RLS- reuip 1 in am and 2 at hs and valium at hs(not resting). holding Insomnia-sleeps for 69mnutes and then awake and walks around.  Pt not aware.  Sleepwalker as child.  Groggy throughout day.   At times, thinks she is alone and somewhere else.  Poor memory. Losing things.  Eating well. Frustrated about mDentistprozac for sev mo.  Some crying spells in eve-afraid of being alone.  Anxiety about same  There are no preventive care reminders to display for this patient.  Past Medical History:  Diagnosis Date   Abdominal bloating    Abnormal LFTs (liver function tests)    Ankle fracture, bimalleolar, closed, right, initial encounter    Anxiety disorder    Arthritis    Asthma    Bimalleolar ankle fracture, right, closed, initial encounter 01/06/2019   Depression    Diverticulosis    Edema    Fatty liver    Fibromyalgia    GERD (gastroesophageal reflux disease)    Giant cell arteritis (HCC)    No biopsy secondary to steroid use   Hx of adenomatous colonic polyps    Hyperlipidemia    Hypertension    IBS (irritable bowel syndrome)    Lumbar radiculitis    Osteoporosis 09/2017   T score -2.6 left femoral neck   Peptic ulcer disease    Skin cancer    shoulder, leg, right eye (? squamous by resection)   Thoracic radiculitis    Thyroid nodule    "removed"   Urinary incontinence    Vertigo     Past Surgical History:  Procedure Laterality Date   ABDOMINAL HYSTERECTOMY     BSO   BACK SURGERY  2005   lower back   EYE SURGERY Right 2006   LIVER BIOPSY     fatty liver   NASAL  SEPTUM SURGERY     ORIF ANKLE FRACTURE Right 01/11/2019   Procedure: OPEN REDUCTION INTERNAL FIXATION (ORIF) RIGHT BIMALLEOLAR ANKLE FRACTURE;  Surgeon: XLeandrew Koyanagi MD;  Location: [facility]  Service: Orthopedics;  Laterality: Right;   right knee surgery  2006   arthroscopy   right leg skin cancer surgery  2014   right shoulder skin cancer excision  2012   SKIN CANCER EXCISION     THYROIDECTOMY      Outpatient Medications Prior to Visit  Medication Sig Dispense Refill   albuterol (VENTOLIN HFA) 108 (90 Base) MCG/ACT inhaler USE 2 INHALATIONS UP TO EVERY 4 HOURS IF CANNOT CATCH YOUR BREATH 18 g 1   amoxicillin-clavulanate (AUGMENTIN) 500-125 MG tablet      aspirin EC 81 MG tablet Take 1 tablet (81 mg total) by mouth 2 (two) times daily. 84 tablet 0   calcium-vitamin D (OSCAL WITH D) 500-200 MG-UNIT tablet Take 1 tablet by mouth 3 (three) times daily. 90 tablet 6   Cholecalciferol (VITAMIN D3) 20 MCG (800 UNIT) TABS Take 1 tablet by mouth daily. 90 tablet 3   diazepam (VALIUM) 5 MG tablet Take 1 tablet (5 mg  total) by mouth at bedtime as needed for anxiety. 90 tablet 1   estradiol (ESTRACE) 0.1 MG/GM vaginal cream Place vaginally.     Methen-Hyosc-Meth Blue-Na Phos (ME/NAPHOS/MB/HYO1) 81.6 MG TABS SMARTSIG:Tablet(s) By Mouth 2-3 Times Daily PRN     neomycin-colistin-hydrocortisone-thonzonium (CORTISPORIN-TC) 3.05-18-08-0.5 MG/ML OTIC suspension Place 3 drops into the right ear 4 (four) times daily. 10 mL 0   olmesartan (BENICAR) 40 MG tablet TAKE 1 TABLET DAILY 90 tablet 3   omega-3 acid ethyl esters (LOVAZA) 1 g capsule Take 2 capsules (2 g total) by mouth 2 (two) times daily. 360 capsule 3   predniSONE (DELTASONE) 5 MG tablet Take 10 mg by mouth daily.     Probiotic Product (PROBIOTIC PO) Take by mouth daily.     RESTASIS 0.05 % ophthalmic emulsion 1 drop 2 (two) times daily.     rOPINIRole (REQUIP) 1 MG tablet Take 1 tablet (1 mg total) by mouth 3 (three) times daily.  Take 1 tab in am and 2 at night 270 tablet 1   tocilizumab (ACTEMRA) 400 MG/20ML SOLN injection      tocilizumab 4 mg/kg in sodium chloride 0.9 % Inject 280 mg into the vein.     traMADol (ULTRAM) 50 MG tablet TAKE ONE TABLET BY MOUTH EVERY 6 HOURS AS NEEDED FOR MODERATE PAIN 40 tablet 0   amLODipine (NORVASC) 2.5 MG tablet Take 1 tablet (2.5 mg total) by mouth daily. 90 tablet 1   budesonide-formoterol (SYMBICORT) 160-4.5 MCG/ACT inhaler Inhale 2 puffs into the lungs 2 (two) times daily. 3 each 3   FLUoxetine (PROZAC) 20 MG capsule Take 1 capsule (20 mg total) by mouth daily. 90 capsule 3   cephALEXin (KEFLEX) 250 MG capsule Take 250 mg by mouth at bedtime. (Patient not taking: Reported on 04/02/2022)     No facility-administered medications prior to visit.    Allergies  Allergen Reactions   Atorvastatin Other (See Comments)    Severe headache   Oxycodone Itching    hallucinations   Statins    Advair Diskus [Fluticasone-Salmeterol] Other (See Comments)    Mouth sores   Sulfonamide Derivatives Rash   ROS neg/noncontributory except as noted HPI/below      Objective:     BP (!) 140/68 (BP Location: Left Arm, Patient Position: Sitting, Cuff Size: Normal)   Pulse 79   Temp 98.3 F (36.8 C) (Temporal)   Ht '5\' 3"'$  (1.6 m)   Wt 198 lb (89.8 kg)   SpO2 95%   BMI 35.07 kg/m  Wt Readings from Last 3 Encounters:  04/02/22 198 lb (89.8 kg)  01/03/22 197 lb 8 oz (89.6 kg)  12/28/21 201 lb (91.2 kg)    Physical Exam   Gen: WDWN NAD HEENT: NCAT, conjunctiva not injected, sclera nonicteric NECK:  supple, no thyromegaly, no nodes, no carotid bruits CARDIAC: RRR, S1S2+, no murmur. DP 2+B LUNGS: CTAB. No wheezes ABDOMEN:  BS+, soft, NTND, No HSM, no masses EXT: tr edema MSK: in w/c.  NEURO: A&O x2  CN II-XII intact.  PSYCH: normal mood. Good eye contact     Assessment & Plan:   Problem List Items Addressed This Visit       Cardiovascular and Mediastinum   HYPERTENSION,  BENIGN - Primary   Relevant Medications   amLODipine (NORVASC) 2.5 MG tablet     Other   Anemia, iron deficiency   Relevant Orders   CBC (Completed)   IBC + Ferritin (Completed)   Vitamin B12 (Completed)   Depression  with anxiety   Relevant Medications   sertraline (ZOLOFT) 25 MG tablet   RLS (restless legs syndrome)   Other Visit Diagnoses     Chronic fatigue       Relevant Orders   TSH (Completed)   CBC (Completed)   IBC + Ferritin (Completed)   Vitamin B12 (Completed)   Age-related cognitive decline         1.  Hypertension-chronic.  Controlled.  Renewed amlodipine 2.5 mg.  Continue Benicar 40 mg. 2.  Restless leg syndrome-chronic.  Fairly well-controlled.  Continue Mirapex 1 mg in the morning and 2 mg in the evening. 3.  Depression/anxiety-chronic.  A little worse since stopped Prozac.  She has fears of being left alone.  Does take Valium at times (daughter is very aware of problems with this).  Will start Zoloft 25 mg at bedtime.  May need to increase to 50 mg.  Daughter will let us know.  Follow-up in 3 months. 4.  Cognitive decline-more than likely age-related.  Medications contribute as well (narcotics, benzos) as well as disease states (PMR, giant cell arteritis).  To help patient alleviate some of her worries, advised to get some kind of door locks so she cannot get out if she starts wandering/sleepwalking.  We have discussed pros and cons of medications.  Daughter would like to try something.  Will trial Namenda 5 mg twice daily.  Follow-up 3 months 5.  Iron deficiency anemia-will check CBC, iron studies, B12 6.  Chronic fatigue-multifactorial.  Advised some of it is due to her age, inactivity, cognitive decline, medications, aging process.  Will check TSH, B12, CBC.  Meds ordered this encounter  Medications   amLODipine (NORVASC) 2.5 MG tablet    Sig: Take 1 tablet (2.5 mg total) by mouth daily.    Dispense:  90 tablet    Refill:  3   budesonide-formoterol  (SYMBICORT) 160-4.5 MCG/ACT inhaler    Sig: Inhale 2 puffs into the lungs 2 (two) times daily.    Dispense:  3 each    Refill:  3    Intol advair   sertraline (ZOLOFT) 25 MG tablet    Sig: Take 1 tablet (25 mg total) by mouth daily.    Dispense:  90 tablet    Refill:  3   memantine (NAMENDA) 5 MG tablet    Sig: Take 1 tablet (5 mg total) by mouth 2 (two) times daily.    Dispense:  180 tablet    Refill:  1    Wellington Hampshire, MD

## 2022-04-02 NOTE — Patient Instructions (Addendum)
It was very nice to see you today!  Add a safety mechanism to doors.  Zoloft at eve.  May need to increase dose.  Let me know.   Try 1/2 diazepam.  PLEASE NOTE:  If you had any lab tests please let us know if you have not heard back within a few days. You may see your results on MyChart before we have a chance to review them but we will give you a call once they are reviewed by [place]. If we ordered any referrals today, please let us know if you have not heard from their office within the next week.   Please try these tips to maintain a healthy lifestyle:  Eat most of your calories during the day when you are active. Eliminate processed foods including packaged sweets (pies, cakes, cookies), reduce intake of potatoes, white bread, white pasta, and white rice. Look for whole grain options, oat flour or almond flour.  Each meal should contain half fruits/vegetables, one quarter protein, and one quarter carbs (no bigger than a computer mouse).  Cut down on sweet beverages. This includes juice, soda, and sweet tea. Also watch fruit intake, though this is a healthier sweet option, it still contains natural sugar! Limit to 3 servings daily.  Drink at least 1 glass of water with each meal and aim for at least 8 glasses per day  Exercise at least 150 minutes every week.

## 2022-04-02 NOTE — Progress Notes (Signed)
1.  Hemoglobin is a little on the high side.  Make sure you are drinking enough water.  Will continue to monitor. 2.  B12 is on the low end of normal.  Suggest taking B12 vitamin over-the-counter 1000 mcg/day

## 2022-04-06 ENCOUNTER — Encounter: Payer: Self-pay | Admitting: Family Medicine

## 2022-04-09 ENCOUNTER — Other Ambulatory Visit: Payer: Self-pay | Admitting: *Deleted

## 2022-04-09 DIAGNOSIS — E538 Deficiency of other specified B group vitamins: Secondary | ICD-10-CM

## 2022-04-09 MED ORDER — "BD LUER-LOK SYRINGE 25G X 1"" 3 ML MISC": 0 refills | Status: DC

## 2022-04-09 MED ORDER — CYANOCOBALAMIN 1000 MCG/ML IJ SOLN: INTRAMUSCULAR | 0 refills | Status: DC

## 2022-04-12 DIAGNOSIS — M316 Other giant cell arteritis: Secondary | ICD-10-CM | POA: Diagnosis not present

## 2022-05-14 DIAGNOSIS — M316 Other giant cell arteritis: Secondary | ICD-10-CM | POA: Diagnosis not present

## 2022-05-14 DIAGNOSIS — M2550 Pain in unspecified joint: Secondary | ICD-10-CM | POA: Diagnosis not present

## 2022-05-14 DIAGNOSIS — Z79899 Other long term (current) drug therapy: Secondary | ICD-10-CM | POA: Diagnosis not present

## 2022-05-22 DIAGNOSIS — N3020 Other chronic cystitis without hematuria: Secondary | ICD-10-CM | POA: Diagnosis not present

## 2022-06-06 DIAGNOSIS — N3020 Other chronic cystitis without hematuria: Secondary | ICD-10-CM | POA: Diagnosis not present

## 2022-06-14 ENCOUNTER — Other Ambulatory Visit: Payer: Self-pay | Admitting: Family Medicine

## 2022-06-20 ENCOUNTER — Other Ambulatory Visit: Payer: Self-pay | Admitting: Family Medicine

## 2022-06-20 DIAGNOSIS — E538 Deficiency of other specified B group vitamins: Secondary | ICD-10-CM

## 2022-06-24 ENCOUNTER — Emergency Department (HOSPITAL_BASED_OUTPATIENT_CLINIC_OR_DEPARTMENT_OTHER)
Admission: EM | Admit: 2022-06-24 | Discharge: 2022-06-24 | Disposition: A | Discharge status: Home / Self Care | Payer: Medicare Other | Attending: Emergency Medicine | Admitting: Emergency Medicine

## 2022-06-24 ENCOUNTER — Emergency Department (HOSPITAL_BASED_OUTPATIENT_CLINIC_OR_DEPARTMENT_OTHER): Payer: Medicare Other

## 2022-06-24 ENCOUNTER — Encounter (HOSPITAL_BASED_OUTPATIENT_CLINIC_OR_DEPARTMENT_OTHER): Payer: Self-pay | Admitting: Urology

## 2022-06-24 ENCOUNTER — Telehealth: Payer: Self-pay | Admitting: Family Medicine

## 2022-06-24 DIAGNOSIS — Z79899 Other long term (current) drug therapy: Secondary | ICD-10-CM | POA: Diagnosis not present

## 2022-06-24 DIAGNOSIS — J189 Pneumonia, unspecified organism: Secondary | ICD-10-CM | POA: Insufficient documentation

## 2022-06-24 DIAGNOSIS — I10 Essential (primary) hypertension: Secondary | ICD-10-CM | POA: Insufficient documentation

## 2022-06-24 DIAGNOSIS — Z7982 Long term (current) use of aspirin: Secondary | ICD-10-CM | POA: Insufficient documentation

## 2022-06-24 DIAGNOSIS — R0602 Shortness of breath: Secondary | ICD-10-CM | POA: Diagnosis not present

## 2022-06-24 DIAGNOSIS — R079 Chest pain, unspecified: Secondary | ICD-10-CM | POA: Diagnosis not present

## 2022-06-24 LAB — CBC
HCT: 46.1 % — ABNORMAL HIGH (ref 36.0–46.0)
Hemoglobin: 15.3 g/dL — ABNORMAL HIGH (ref 12.0–15.0)
MCH: 32.4 pg (ref 26.0–34.0)
MCHC: 33.2 g/dL (ref 30.0–36.0)
MCV: 97.7 fL (ref 80.0–100.0)
Platelets: 173 10*3/uL (ref 150–400)
RBC: 4.72 MIL/uL (ref 3.87–5.11)
RDW: 12.4 % (ref 11.5–15.5)
WBC: 9 10*3/uL (ref 4.0–10.5)
nRBC: 0 % (ref 0.0–0.2)

## 2022-06-24 LAB — TROPONIN I (HIGH SENSITIVITY)
Troponin I (High Sensitivity): 23 ng/L — ABNORMAL HIGH (ref <18)
Troponin I (High Sensitivity): 22 ng/L — ABNORMAL HIGH (ref <18)

## 2022-06-24 LAB — BASIC METABOLIC PANEL
Anion gap: 10 (ref 5–15)
BUN: 26 mg/dL — ABNORMAL HIGH (ref 8–23)
CO2: 27 mmol/L (ref 22–32)
Calcium: 9.1 mg/dL (ref 8.9–10.3)
Chloride: 98 mmol/L (ref 98–111)
Creatinine, Ser: 1.11 mg/dL — ABNORMAL HIGH (ref 0.44–1.00)
GFR, Estimated: 47 mL/min — ABNORMAL LOW (ref >60)
Glucose, Bld: 90 mg/dL (ref 70–99)
Potassium: 4.6 mmol/L (ref 3.5–5.1)
Sodium: 135 mmol/L (ref 135–145)

## 2022-06-24 LAB — BRAIN NATRIURETIC PEPTIDE: B Natriuretic Peptide: 108.1 pg/mL — ABNORMAL HIGH (ref 0.0–100.0)

## 2022-06-24 MED ORDER — MORPHINE SULFATE (PF) 2 MG/ML IV SOLN
2.0000 mg | Freq: Once | INTRAVENOUS | Status: AC
  Administered 2022-06-24: 2 mg via INTRAVENOUS
  Filled 2022-06-24: qty 1

## 2022-06-24 MED ORDER — AZITHROMYCIN 250 MG PO TABS
500.0000 mg | ORAL_TABLET | Freq: Every day | ORAL | Status: DC
  Administered 2022-06-24: 500 mg via ORAL
  Filled 2022-06-24: qty 2

## 2022-06-24 MED ORDER — AZITHROMYCIN 250 MG PO TABS: 250.0000 mg | ORAL_TABLET | Freq: Every day | ORAL | 0 refills | Status: AC

## 2022-06-24 MED ORDER — IOHEXOL 350 MG/ML SOLN
100.0000 mL | Freq: Once | INTRAVENOUS | Status: AC | PRN
  Administered 2022-06-24: 75 mL via INTRAVENOUS

## 2022-06-24 MED ORDER — SODIUM CHLORIDE 0.9 % IV BOLUS
500.0000 mL | Freq: Once | INTRAVENOUS | Status: AC
  Administered 2022-06-24: 500 mL via INTRAVENOUS

## 2022-06-24 NOTE — ED Provider Notes (Signed)
[facility] Provider Note   CSN: [id number] Arrival date & time: 06/24/22  1248     History  Chief Complaint  Patient presents with   Shortness of Breath    Sally SomJuanita Acosta is a 89 y.o. female.  Has history of controlled hypertension, giant cell arteritis, and anxiety.  She presents the ER complaining of upper back pain located under her shoulder blades for the past 3 days that has worsened this morning and now she is very short of breath as well, states it is very painful to take a deep breath.  She tried her home tramadol and Valium this morning without relief.  She denies cough, fevers or chills, similar episodes in the past.   Shortness of Breath      Home Medications Prior to Admission medications   Medication Sig Start Date End Date Taking? Authorizing Provider  azithromycin (ZITHROMAX) 250 MG tablet Take 1 tablet (250 mg total) by mouth daily for 4 days. 06/25/22 06/29/22 Yes Zya Finkle A, PA-C  SYRINGE-NEEDLE, DISP, 3 ML (B-D 3CC LUER-LOK SYR 25GX1") 25G X 1" 3 ML MISC Use weekly x 4 weeks then monthly with B 12 04/09/22   Jeani SowKulik, Ann Marie, MD  albuterol (VENTOLIN HFA) 108 (90 Base) MCG/ACT inhaler USE 2 INHALATIONS UP TO EVERY 4 HOURS IF CANNOT CATCH YOUR BREATH 05/03/21   Jeani SowKulik, Ann Marie, MD  amLODipine (NORVASC) 2.5 MG tablet Take 1 tablet (2.5 mg total) by mouth daily. 04/02/22   Jeani SowKulik, Ann Marie, MD  amoxicillin-clavulanate (AUGMENTIN) 500-125 MG tablet  03/28/22   [provider]  aspirin EC 81 MG tablet Take 1 tablet (81 mg total) by mouth 2 (two) times daily. 01/11/19   Tarry KosXu, Naiping M, MD  budesonide-formoterol [facility](SYMBICORT) 160-4.5 MCG/ACT inhaler Inhale 2 puffs into the lungs 2 (two) times daily. 04/02/22   Jeani SowKulik, Ann Marie, MD  calcium-vitamin D (OSCAL WITH D) 500-200 MG-UNIT tablet Take 1 tablet by mouth 3 (three) times daily. 01/11/19   Tarry KosXu, Naiping M, MD  cephALEXin (KEFLEX) 250 MG capsule Take 250 mg by mouth at  bedtime. Patient not taking: Reported on 04/02/2022 03/30/21   [provider]  Cholecalciferol (VITAMIN D3) 20 MCG (800 UNIT) TABS Take 1 tablet by mouth daily. 08/19/19   Kuneff, Renee A, DO  cyanocobalamin (VITAMIN B12) 1000 MCG/ML injection INJECT 1000 MCG WEEKLY FOR 4 WEEKS THEN MONTHLY THEREAFTER 06/20/22   Jeani SowKulik, Ann Marie, MD  diazepam (VALIUM) 5 MG tablet Take 1 tablet (5 mg total) by mouth at bedtime as needed for anxiety. 01/03/22   Jeani SowKulik, Ann Marie, MD  estradiol (ESTRACE) 0.1 MG/GM vaginal cream Place vaginally. 03/30/21   [provider]  memantine (NAMENDA) 5 MG tablet Take 1 tablet (5 mg total) by mouth 2 (two) times daily. 04/02/22   Jeani SowKulik, Ann Marie, MD  Methen-Hyosc-Meth Blue-Na Phos (ME/NAPHOS/MB/HYO1) 81.6 MG TABS SMARTSIG:Tablet(s) By Mouth 2-3 Times Daily PRN 03/28/22   [provider]  neomycin-colistin-hydrocortisone-thonzonium (CORTISPORIN-TC) 3.05-18-08-0.5 MG/ML OTIC suspension Place 3 drops into the right ear 4 (four) times daily. 08/17/20   Claiborne BillingsKuneff, Renee A, DO  olmesartan (BENICAR) 40 MG tablet TAKE 1 TABLET DAILY 10/15/21   Jeani SowKulik, Ann Marie, MD  omega-3 acid ethyl esters (LOVAZA) 1 g capsule Take 2 capsules (2 g total) by mouth 2 (two) times daily. 05/03/21   Jeani SowKulik, Ann Marie, MD  predniSONE (DELTASONE) 5 MG tablet Take 10 mg by mouth daily. 07/22/16   [provider]  Probiotic  Product (PROBIOTIC PO) Take by mouth daily.    [provider]  RESTASIS 0.05 % ophthalmic emulsion 1 drop 2 (two) times daily. 04/05/20   [provider]  rOPINIRole (REQUIP) 1 MG tablet TAKE 1 TABLET IN THE MORNING AND 2 TABLETS IN THE EVENING 06/14/22   Jeani Sow, MD  sertraline (ZOLOFT) 25 MG tablet Take 1 tablet (25 mg total) by mouth daily. 04/02/22   Jeani Sow, MD  tocilizumab (ACTEMRA) 400 MG/20ML SOLN injection  11/27/17   [provider]  tocilizumab 4 mg/kg in sodium chloride 0.9 % Inject 280 mg into the vein. 12/03/16    [provider]  traMADol (ULTRAM) 50 MG tablet TAKE ONE TABLET BY MOUTH EVERY 6 HOURS AS NEEDED FOR MODERATE PAIN 11/12/17   Kerrin Champagne, MD      Allergies    Atorvastatin, Oxycodone, Statins, Advair diskus [fluticasone-salmeterol], and Sulfonamide derivatives    Review of Systems   Review of Systems  Respiratory:  Positive for shortness of breath.     Physical Exam Updated Vital Signs BP (!) 179/70 Comment: after walking  Pulse 90 Comment: after walking  Temp (!) 97.5 F (36.4 C) (Oral)   Resp 18 Comment: after walking  Ht 5\' 3"  (1.6 m)   Wt 89.8 kg   SpO2 92% Comment: after walking  BMI 35.07 kg/m  Physical Exam Vitals and nursing note reviewed.  Constitutional:      Comments: Uncomfortable in appearance   HENT:     Head: Normocephalic and atraumatic.  Eyes:     Extraocular Movements: Extraocular movements intact.     Conjunctiva/sclera: Conjunctivae normal.     Pupils: Pupils are equal, round, and reactive to light.  Cardiovascular:     Rate and Rhythm: Normal rate and regular rhythm.     Heart sounds: No murmur heard. Pulmonary:     Effort: Pulmonary effort is normal. Tachypnea present. No respiratory distress.     Breath sounds: Normal breath sounds.  Chest:     Chest wall: No tenderness or crepitus.  Abdominal:     Palpations: Abdomen is soft.     Tenderness: There is no abdominal tenderness.  Musculoskeletal:        General: No swelling.     Cervical back: Normal range of motion and neck supple.     Right lower leg: No tenderness. No edema.     Left lower leg: No tenderness. No edema.  Skin:    General: Skin is warm and dry.     Capillary Refill: Capillary refill takes less than 2 seconds.  Neurological:     General: No focal deficit present.     Mental Status: She is alert and oriented to person, place, and time.  Psychiatric:        Mood and Affect: Mood normal.     ED Results / Procedures / Treatments   Labs (all labs ordered are  listed, but only abnormal results are displayed) Labs Reviewed  BASIC METABOLIC PANEL - Abnormal; Notable for the following components:      Result Value   BUN 26 (*)    Creatinine, Ser 1.11 (*)    GFR, Estimated 47 (*)    All other components within normal limits  CBC - Abnormal; Notable for the following components:   Hemoglobin 15.3 (*)    HCT 46.1 (*)    All other components within normal limits  BRAIN NATRIURETIC PEPTIDE - Abnormal; Notable for the following components:  B Natriuretic Peptide 108.1 (*)    All other components within normal limits  TROPONIN I (HIGH SENSITIVITY) - Abnormal; Notable for the following components:   Troponin I (High Sensitivity) 23 (*)    All other components within normal limits  TROPONIN I (HIGH SENSITIVITY) - Abnormal; Notable for the following components:   Troponin I (High Sensitivity) 22 (*)    All other components within normal limits    EKG EKG Interpretation  Date/Time:  Monday June 24 2022 13:14:49 EDT Ventricular Rate:  76 PR Interval:  189 QRS Duration: 157 QT Interval:  438 QTC Calculation: 493 R Axis:   -36 Text Interpretation: Sinus rhythm Left bundle branch block Confirmed by Pricilla Loveless [phone]) on 06/24/2022 1:45:15 PM  Radiology CT Angio Chest PE W and/or Wo Contrast  Result Date: 06/24/2022 CLINICAL DATA:  Pain between the shoulder blades. EXAM: CT ANGIOGRAPHY CHEST WITH CONTRAST TECHNIQUE: Multidetector CT imaging of the chest was performed using the standard protocol during bolus administration of intravenous contrast. Multiplanar CT image reconstructions and MIPs were obtained to evaluate the vascular anatomy. RADIATION DOSE REDUCTION: This exam was performed according to the departmental dose-optimization program which includes automated exposure control, adjustment of the mA and/or kV according to patient size and/or use of iterative reconstruction technique. CONTRAST:  37mL OMNIPAQUE IOHEXOL 350 MG/ML SOLN COMPARISON:   None Available. FINDINGS: Cardiovascular: Heart size upper normal. No substantial pericardial effusion. Coronary artery calcification is evident. Mild atherosclerotic calcification is noted in the wall of the thoracic aorta. There is no filling defect within the opacified pulmonary arteries to suggest the presence of an acute pulmonary embolus. Mediastinum/Nodes: No mediastinal lymphadenopathy. There is no hilar lymphadenopathy. The esophagus has normal imaging features. There is no axillary lymphadenopathy. Lungs/Pleura: Small area of tree-in-bud nodularity posterior left lung apex suggests sequelae of atypical infection. No overtly suspicious pulmonary nodule or mass. No focal airspace consolidation. No pleural effusion. Upper Abdomen: 8 mm hypodensity in the left liver is too small to characterize but most likely benign. No followup imaging is recommended. Visualized portion of the upper abdomen is unremarkable. Musculoskeletal: No worrisome lytic or sclerotic osseous abnormality. Mild compression deformity noted at T5. Review of the MIP images confirms the above findings. IMPRESSION: 1. No CT evidence for acute pulmonary embolus. 2. Small area of tree-in-bud nodularity posterior left lung apex suggests sequelae of atypical infection. 3. Mild compression deformity at T5, age indeterminate. 4.  Aortic Atherosclerosis (ICD10-I70.0). Electronically Signed   By: Kennith Center M.D.   On: 06/24/2022 14:44   DG Chest Port 1 View  Result Date: 06/24/2022 CLINICAL DATA:  SOB EXAM: PORTABLE CHEST 1 VIEW COMPARISON:  10/24/2016 FINDINGS: Cardiac silhouette appears prominent. No pneumonia or pulmonary edema. No pneumothorax or pleural effusion. Aorta is calcified. IMPRESSION: Enlarged cardiac silhouette.  Lungs are clear. Electronically Signed   By: Layla Maw M.D.   On: 06/24/2022 13:19    Procedures Procedures    Medications Ordered in ED Medications  azithromycin (ZITHROMAX) tablet 500 mg (500 mg Oral  Given 06/24/22 1554)  morphine (PF) 2 MG/ML injection 2 mg (2 mg Intravenous Given 06/24/22 1433)  sodium chloride 0.9 % bolus 500 mL (0 mLs Intravenous Stopped 06/24/22 1552)  iohexol (OMNIPAQUE) 350 MG/ML injection 100 mL (75 mLs Intravenous Contrast Given 06/24/22 1409)    ED Course/ Medical Decision Making/ A&P Clinical Course as of 06/24/22 1810  Mon Jun 24, 2022  1354 Patient presenting with pleuritic chest pain, shortness of breath, has been painful  the past couple of days became short of breath today.  She was somewhat tachypneic, very uncomfortable appearance.  No chest or abdominal pain.  Pain improved significantly with 2 mg of morphine.  Will get CTA chest due to his severe pleuritic chest pain to rule out PE or other intrathoracic process. [CB]  1643 SpO2: 95 % [CB]  1644 O2 Device: Room Air [CB]    Clinical Course User Index [CB] Ma Rings, PA-C                             Medical Decision Making DDD X: ACS, PE, pneumonia, aortic dissection, muscle spasm, other  Imaging: Chest x-ray shows no pulmonary edema or infiltrate on my independent interpretation, radiology read in agreement CT chest radiology interpretation shows tree-in-bud appearance to the left lung apex possibly atypical infection which would account for patient's symptoms, no PE  Cardiac monitor: Patient maintained on cardiac monitor sinus rhythm, ECG shows left bundle branch block  ED Course: Patient presents for upper back pain that is pleuritic and shortness of breath, has admission for possible PE, scan shows no PE but does show atypical pneumonia which would account for this.  She has had complete resolution of her pain with 2 mg of morphine, patient was observed waiting for second troponin which did not show any abnormal delta but prior to discharge patient noted to desaturate while sleeping to 85% on room air.  She was put on 2 L and improved to 100%, ambulatory pulse ox ordered and showed desaturation to  86% on ambulation.  Patient refusing admission so we are trying to set patient up with home oxygen.  Patient informed it will take 5 to 6 hours, her daughter is with her.  Follow-Up with Primary Care and Strict Return Precautions.  Signed out to Dr. Silverio Lay Pending Her Home Oxygen Set up  Amount and/or Complexity of Data Reviewed Labs: ordered. Radiology: ordered.  Risk Prescription drug management.           Final Clinical Impression(s) / ED Diagnoses Final diagnoses:  Atypical pneumonia    Rx / DC Orders ED Discharge Orders          Ordered    azithromycin (ZITHROMAX) 250 MG tablet  Daily        06/24/22 1633    For home use only DME oxygen        06/24/22 1729              Ma Rings, PA-C 06/24/22 1810    Charlynne Pander, MD 06/24/22 [phone]

## 2022-06-24 NOTE — ED Triage Notes (Signed)
Pt states back pain and Sob x 2 days gradually worsening States pain to shoulder blades and under ribs  SOB at rest noted  Took tramadol and diazepam at 0800

## 2022-06-24 NOTE — Telephone Encounter (Signed)
Caller states pt informed her that she is having pain between her shoulder blades. States patient is finding it hard to catch a breath due to the pain she is having. Caller states she is at her home getting prepared to head over to patient. Caller has been transferred to triage.

## 2022-06-24 NOTE — ED Notes (Signed)
   06/24/22 1654  Resting  Supplemental oxygen during test? No  Resting Heart Rate 87  Resting Sp02 93  Lap 1 (250 feet)  HR 97  02 Sat 90   Abulated ~159ft, HR max 97, SpO2 at lowest 86% on r/a with portable pulse ox, denies DOE.SpO2 was 90% back on monitor, recovered to 95%

## 2022-06-24 NOTE — Telephone Encounter (Signed)
Patient currently at ED

## 2022-06-24 NOTE — ED Notes (Signed)
   06/24/22 1753  Resting  Supplemental oxygen during test? Yes  O2 Flow Rate (L/min) 2 L/min  Type Continuous  Resting Heart Rate 90  Resting Sp02 91  Lap 1 (250 feet)  HR 105  02 Sat 93 (lowest 89% on lpm [place])   Patient ambulated on 2lpm [place], SpO2 @ lowest 89%, averaged 93-94%, denies DOE.  Recovered quickly on room air in room 5 to 95%.

## 2022-06-24 NOTE — Discharge Instructions (Addendum)
It was a pleasure taking care of you today.  You were seen today for shortness of breath and pain in your upper back.  Your blood work was reassuring, there were no signs of a cardiac problem but your CT scan did show a pneumonia which would account for your symptoms.  Make sure you take the antibiotics.  Wear oxygen when you walk and when you sleep.  Follow-up closely with your primary care doctor and come back to the ER if you have new or worsening symptoms.

## 2022-06-24 NOTE — ED Notes (Signed)
Pulse OX sleeping on room air 85-89% Pulse OX ambulating on room air 86% Pulse Ox ambulating on 2lpm [place] 89% Patient recovers quickly from exertion with SpO2> 92% on room air.

## 2022-06-24 NOTE — Telephone Encounter (Signed)
FYI

## 2022-06-24 NOTE — Telephone Encounter (Addendum)
Final disposition: Call EMS 911 Now --Patient is currently at the ED.  Patient Name: [name] Gender: Female DOB: Jul 02, 1929 Age: 89 Y 16 D Return Phone Number: [phone] (Primary) Address: City/ State/ Zip: [place]  [address] Client [facility] - [facility], [facility] Provider Ruthine Dose, Ann Contact Type Call Who Is Calling Patient / Member / Family / Caregiver Call Type Triage / Clinical Caller Name [name] Relationship To Patient Daughter Return Phone Number [phone] (Primary) Chief Complaint BREATHING - shortness of breath or sounds breathless Reason for Call Symptomatic / Request for Health Information Initial Comment Caller states her mom is having shoulder blade pain and she is having trouble catching her breath. Translation No Nurse Assessment Nurse: Kizzie Bane, RN, Marylene Land Date/Time (Eastern Time): 06/24/2022 12:09:55 PM Confirm and document reason for call. If symptomatic, describe symptoms. ---Caller states she is having pain in her upper back between her shoulder blades that started 2 days ago and is getting worse Does the patient have any new or worsening symptoms? ---Yes Will a triage be completed? ---Yes Related visit to physician within the last 2 weeks? ---No Does the PT have any chronic conditions? (i.e. diabetes, asthma, this includes High risk factors for pregnancy, etc.) ---No Is this a behavioral health or substance abuse call? ---No Guidelines Guideline Title Affirmed Question Affirmed Notes Nurse Date/Time (Eastern Time) Chest Pain SEVERE difficulty breathing (e.g., struggling for each breath, speaks in single words) Kizzie Bane, RN, Marylene Land 06/24/2022 12:10:57 PM PLEASE NOTE: All timestamps contained within this report are represented as [place] Standard Time. CONFIDENTIALTY NOTICE: This fax transmission is intended only for the addressee. It contains information  that is legally privileged, confidential or otherwise protected from use or disclosure. If you are not the intended recipient, you are strictly prohibited from reviewing, disclosing, copying using or disseminating any of this information or taking any action in reliance on or regarding this information. If you have received this fax in error, please notify us immediately by telephone so that we can arrange for its return to [place]. Phone: [phone], Toll-Free: [phone], Fax: [phone] Page: 2 of 2 Call Id: [id number] Disp. Time Lamount Cohen Time) Disposition Final User 06/24/2022 12:04:31 PM Send to Urgent Wynona Canes 06/24/2022 12:15:27 PM Call EMS 911 Now Yes Kizzie Bane, RN, Marylene Land 06/24/2022 12:19:24 PM 911 Outcome Documentation Kizzie Bane, RN, Marylene Land Reason: Caller states she will call the ambulance. Called back in 5 minutes and she state she is going to drive her to the hospital Final Disposition 06/24/2022 12:15:27 PM Call EMS 911 Now Yes Kizzie Bane, RN, Rosalyn Charters Disagree/Comply Comply Caller Understands Yes PreDisposition Did not know what to do Care Advice Given Per Guideline CALL EMS 911 NOW: * Immediate medical attention is needed. You need to hang up and call 911 (or an ambulance). * Triager Discretion: I'll call you back in a few minutes to be sure you were able to reach them. CARE ADVICE given per Chest Pain (Adult) guideline.

## 2022-06-24 NOTE — ED Notes (Signed)
Per daughter, patient fell asleep and saw her oxygen drop to 85%. When I entered the room, I noted a sat of 89% with a good pleth wave. Patient placed on 2lpm via  for same.

## 2022-06-24 NOTE — Care Management (Signed)
ED RNCM received consult for home oxygen from EDP.  Reviewed chart order and oxygen qualifying note. Contacted [employer] company after hour rep. Order was accepted anticipate oxygen delivery  prior to discharge within two hours. Updated EDP.  Michel Bickers RN, BSN CNOR ED RN Care Manager [phone]

## 2022-06-24 NOTE — ED Notes (Signed)
Pt. Has been having pain for 3-4 days between her shoulder blades and today real hard to get her breath.

## 2022-06-27 ENCOUNTER — Other Ambulatory Visit: Payer: Self-pay | Admitting: Family Medicine

## 2022-06-27 DIAGNOSIS — E781 Pure hyperglyceridemia: Secondary | ICD-10-CM

## 2022-06-28 ENCOUNTER — Encounter: Payer: Self-pay | Admitting: Family Medicine

## 2022-06-28 ENCOUNTER — Ambulatory Visit (INDEPENDENT_AMBULATORY_CARE_PROVIDER_SITE_OTHER): Payer: Medicare Other | Admitting: Family Medicine

## 2022-06-28 VITALS — BP 160/60 | HR 75 | Temp 98.0°F | Ht 63.0 in | Wt 192.1 lb

## 2022-06-28 DIAGNOSIS — J189 Pneumonia, unspecified organism: Secondary | ICD-10-CM

## 2022-06-28 DIAGNOSIS — M546 Pain in thoracic spine: Secondary | ICD-10-CM | POA: Diagnosis not present

## 2022-06-28 NOTE — Progress Notes (Unsigned)
Subjective:     Patient ID: [name], female    DOB: [birth year], 89 y.o.   MRN: [id number]  Chief Complaint  Patient presents with   Medical Management of Chronic Issues    ED Follow-up on 06/24/22 for atypical pneumonia, still having back pain across shoulders radiating around to the front      HPI-here w/daughter Went to ER 4/8 for pneumonia.  Was shortness of breath.  Pain between shoulder blades to sternum.  Has oxygen for activity and sleep.  No cough/drainage/f/C.  Pain w/deep insp.    Memory some better on oxygen.  Health Maintenance Due  Topic Date Due   DEXA SCAN  06/09/2022   Medicare Annual Wellness (AWV)  06/29/2022    Past Medical History:  Diagnosis Date   Abdominal bloating    Abnormal LFTs (liver function tests)    Ankle fracture, bimalleolar, closed, right, initial encounter    Anxiety disorder    Arthritis    Asthma    Bimalleolar ankle fracture, right, closed, initial encounter 01/06/2019   Depression    Diverticulosis    Edema    Fatty liver    Fibromyalgia    GERD (gastroesophageal reflux disease)    Giant cell arteritis    No biopsy secondary to steroid use   Hx of adenomatous colonic polyps    Hyperlipidemia    Hypertension    IBS (irritable bowel syndrome)    Lumbar radiculitis    Osteoporosis 09/2017   T score -2.6 left femoral neck   Peptic ulcer disease    Skin cancer    shoulder, leg, right eye (? squamous by resection)   Thoracic radiculitis    Thyroid nodule    "removed"   Urinary incontinence    Vertigo     Past Surgical History:  Procedure Laterality Date   ABDOMINAL HYSTERECTOMY     BSO   BACK SURGERY  2005   lower back   EYE SURGERY Right 2006   LIVER BIOPSY     fatty liver   NASAL SEPTUM SURGERY     ORIF ANKLE FRACTURE Right 01/11/2019   Procedure: OPEN REDUCTION INTERNAL FIXATION (ORIF) RIGHT BIMALLEOLAR ANKLE FRACTURE;  Surgeon: Tarry Kos, MD;  Location: [facility];  Service:  Orthopedics;  Laterality: Right;   right knee surgery  2006   arthroscopy   right leg skin cancer surgery  2014   right shoulder skin cancer excision  2012   SKIN CANCER EXCISION     THYROIDECTOMY      Outpatient Medications Prior to Visit  Medication Sig Dispense Refill   albuterol (VENTOLIN HFA) 108 (90 Base) MCG/ACT inhaler USE 2 INHALATIONS UP TO EVERY 4 HOURS IF CANNOT CATCH YOUR BREATH 18 g 1   amLODipine (NORVASC) 2.5 MG tablet Take 1 tablet (2.5 mg total) by mouth daily. 90 tablet 3   aspirin EC 81 MG tablet Take by mouth.     azithromycin (ZITHROMAX) 250 MG tablet Take 1 tablet (250 mg total) by mouth daily for 4 days. 4 tablet 0   budesonide-formoterol (SYMBICORT) 160-4.5 MCG/ACT inhaler Inhale 2 puffs into the lungs 2 (two) times daily. 3 each 3   calcium-vitamin D (OSCAL WITH D) 500-200 MG-UNIT tablet Take 1 tablet by mouth 3 (three) times daily. 90 tablet 6   cephALEXin (KEFLEX) 250 MG capsule Take 250 mg by mouth at bedtime.     Cholecalciferol (VITAMIN D3) 20 MCG (800 UNIT) TABS Take 1 tablet by  mouth daily. 90 tablet 3   cyanocobalamin (VITAMIN B12) 1000 MCG/ML injection INJECT 1000 MCG WEEKLY FOR 4 WEEKS THEN MONTHLY THEREAFTER 10 mL 3   diazepam (VALIUM) 5 MG tablet Take 1 tablet (5 mg total) by mouth at bedtime as needed for anxiety. 90 tablet 1   docusate sodium (COLACE) 100 MG capsule Take 100 mg by mouth 2 (two) times daily.     estradiol (ESTRACE) 0.1 MG/GM vaginal cream Place vaginally.     memantine (NAMENDA) 5 MG tablet Take 1 tablet (5 mg total) by mouth 2 (two) times daily. 180 tablet 1   Methen-Hyosc-Meth Blue-Na Phos (ME/NAPHOS/MB/HYO1) 81.6 MG TABS SMARTSIG:Tablet(s) By Mouth 2-3 Times Daily PRN     neomycin-colistin-hydrocortisone-thonzonium (CORTISPORIN-TC) 3.05-18-08-0.5 MG/ML OTIC suspension Place 3 drops into the right ear 4 (four) times daily. 10 mL 0   olmesartan (BENICAR) 40 MG tablet TAKE 1 TABLET DAILY 90 tablet 3   omega-3 acid ethyl esters (LOVAZA)  1 g capsule TAKE 2 CAPSULES TWICE A DAY 360 capsule 3   polyethylene glycol (MIRALAX / GLYCOLAX) 17 g packet Take 17 g by mouth daily.     predniSONE (DELTASONE) 5 MG tablet Take 10 mg by mouth daily.     Probiotic Product (PROBIOTIC PO) Take by mouth daily.     RESTASIS 0.05 % ophthalmic emulsion 1 drop 2 (two) times daily.     rOPINIRole (REQUIP) 1 MG tablet TAKE 1 TABLET IN THE MORNING AND 2 TABLETS IN THE EVENING 270 tablet 3   sertraline (ZOLOFT) 25 MG tablet Take 1 tablet (25 mg total) by mouth daily. 90 tablet 3   SYRINGE-NEEDLE, DISP, 3 ML (B-D 3CC LUER-LOK SYR 25GX1") 25G X 1" 3 ML MISC Use weekly x 4 weeks then monthly with B 12 50 each 0   tocilizumab (ACTEMRA) 400 MG/20ML SOLN injection      tocilizumab 4 mg/kg in sodium chloride 0.9 % Inject 280 mg into the vein.     traMADol (ULTRAM) 50 MG tablet TAKE ONE TABLET BY MOUTH EVERY 6 HOURS AS NEEDED FOR MODERATE PAIN 40 tablet 0   amoxicillin-clavulanate (AUGMENTIN) 500-125 MG tablet      aspirin EC 81 MG tablet Take 1 tablet (81 mg total) by mouth 2 (two) times daily. 84 tablet 0   No facility-administered medications prior to visit.    Allergies  Allergen Reactions   Atorvastatin Other (See Comments)    Severe headache   Oxycodone Itching    hallucinations   Statins    Advair Diskus [Fluticasone-Salmeterol] Other (See Comments)    Mouth sores   Sulfonamide Derivatives Rash   ROS neg/noncontributory except as noted HPI/below      Objective:     BP (!) 160/60   Pulse 75   Temp 98 F (36.7 C) (Temporal)   Ht 5\' 3"  (1.6 m)   Wt 192 lb 2 oz (87.1 kg)   SpO2 99% Comment: 2LO2  BMI 34.03 kg/m  Wt Readings from Last 3 Encounters:  06/28/22 192 lb 2 oz (87.1 kg)  06/24/22 197 lb 15.6 oz (89.8 kg)  04/02/22 198 lb (89.8 kg)    Physical Exam   Gen: WDWN NAD-mildly tired appearing HEENT: NCAT, conjunctiva not injected, sclera nonicteric NECK:  supple, no thyromegaly, no nodes, no carotid bruits CARDIAC: RRR,  S1S2+, no murmur. DP 2+B LUNGS: CTAB. No wheezes ABDOMEN:  BS+, soft, NTND, No HSM, no masses EXT: tr edema MSK: in w/c NEURO: A&O x2.  CN II-XII intact. conversant  PSYCH: normal mood. Good eye contact Back-mid-no rash.  Some TTP posterior ribs  Reviewed ER records     Assessment & Plan:   Problem List Items Addressed This Visit   None Visit Diagnoses     Community acquired pneumonia of left upper lobe of lung    -  Primary   Thoracic spine pain       Relevant Medications   aspirin EC 81 MG tablet     Community-acquired pneumonia-new diagnosis.  Finished course of zpk.  Has not really been using inhalers as she is having some pain in her mid back when taking a deep breath.  Advised that she needs to use them.  I think the pneumonia has cleared, however I think she has some musculoskeletal pain. Pain mid and upper back-worse than usual.  Reviewed CT angiogram of the chest done in the emergency room.  She does have a T5 compression.  Age indeterminate.  Discussed this with patient and her daughter.  If new, may be contributing to this new pain.  She does have tramadol for pain.  She can increase frequency if needed.  She will need probably extra dose of Colace and MiraLAX to offset the potential constipation.  Also she can use Tylenol and cautiously ibuprofen. Follow up 3 month(s),and as needed  No orders of the defined types were placed in this encounter. Addendum 07/01/22 5pm.  Spoke to daughter Anita-patient still complaining of pain-tramadol not holding.  But concerns w/severe constipation w/other medications or increase dose-she will talk w/mom.  Consider orthopedic(has seen Dr. Otelia Sergeant in past) re compression spine to see if more to do.   Re abnormal CT chest-?if pneumonia(tx'd), incidental, other.  Will repeat CT 1 month(s).    Angelena Sole, MD

## 2022-06-28 NOTE — Patient Instructions (Addendum)
Max tramadol 6 in day  Increase colace to 2/day   Monitor oxygen  Tylenol, ibuprofen

## 2022-07-01 ENCOUNTER — Other Ambulatory Visit: Payer: Self-pay | Admitting: Family Medicine

## 2022-07-01 NOTE — Addendum Note (Signed)
Addended by: Angelena Sole on: 07/01/2022 05:07 PM   Modules accepted: Orders

## 2022-07-02 ENCOUNTER — Ambulatory Visit: Payer: Medicare Other | Admitting: Family Medicine

## 2022-07-03 ENCOUNTER — Encounter: Payer: Self-pay | Admitting: Family Medicine

## 2022-07-03 ENCOUNTER — Other Ambulatory Visit: Payer: Self-pay | Admitting: *Deleted

## 2022-07-03 DIAGNOSIS — S22050S Wedge compression fracture of T5-T6 vertebra, sequela: Secondary | ICD-10-CM

## 2022-07-03 NOTE — Telephone Encounter (Signed)
Referral placed.

## 2022-07-05 ENCOUNTER — Encounter: Payer: Self-pay | Admitting: Orthopedic Surgery

## 2022-07-05 ENCOUNTER — Other Ambulatory Visit (INDEPENDENT_AMBULATORY_CARE_PROVIDER_SITE_OTHER): Payer: Medicare Other

## 2022-07-05 ENCOUNTER — Ambulatory Visit (INDEPENDENT_AMBULATORY_CARE_PROVIDER_SITE_OTHER): Payer: Medicare Other | Admitting: Orthopedic Surgery

## 2022-07-05 VITALS — BP 128/48 | HR 74 | Ht 63.0 in | Wt 192.5 lb

## 2022-07-05 DIAGNOSIS — M546 Pain in thoracic spine: Secondary | ICD-10-CM

## 2022-07-05 NOTE — Progress Notes (Addendum)
Orthopedic Spine Surgery Office Note  Assessment: Patient is a 89 y.o. female with T5 compression fracture Date of injury: 06/21/2022 (~2 weeks from injury)   Plan: -Explained that most compression fractures go on to heal without any surgical intervention. They take time to feel better so treating the pain initially while they heal is the usual treatment. With time, pain medication can be weaned as healing occurs.  -Patient has tried tramadol and tylenol  -Recommended continued tylenol and tramadol since that is taking care of her pain -She had previously been treated for osteoporosis but had side effects with fosamax. I explained that there are other medications to treat osteoporosis and she may want to look into treatment again because patients with one compression fracture are at risk for developing subsequent compression fractures -I told her if she is no longer having any pain, then she does not need to follow up but if she is still have pain. she should return to office in 4 weeks, x-rays at next visit: AP/lateral thoracic   Patient expressed understanding of the plan and all questions were answered to the patient's satisfaction.   ___________________________________________________________________________   History:  Patient is a 89 y.o. female who presents today for thoracic spine.  Patient has had pain in her upper thoracic spine for about 2 weeks now.  She states that radiates into her anterior chest wall as well.  She was seen in the emergency department and treated with antibiotics for a pneumonia.  Her pain has been getting better with time.  She now notices the pain with deep breaths only.  She says today she is not having any pain in her back or chest wall.  She has no pain radiating into her lower extremities.  There is no trauma or injury that preceded the onset of her pain.  Of note, has a foot drop on the right side that started after lumbar spine surgery.  No recent  changes   Weakness: Denies Symptoms of imbalance: Yes, since her lumbar spine surgery in 2005.  No recent changes Paresthesias and numbness: Denies Bowel or bladder incontinence: Has chronic urinary incontinence with no recent changes.  No bowel incontinence. Saddle anesthesia: Denies  Treatments tried: tramadol, Tylenol  Review of systems: Denies fevers and chills, night sweats, unexplained weight loss. Pain has awakened her at night. Has history of squamous cell skin cancer that has been resected  Past medical history: Depression/anxiety Giant cell arteritis GERD Diverticulosis Irritable bowel syndrome HTN HLD Skin squamous cell carcinoma Urinary incontinence Fibromyalgia Osteoporosis  Allergies: statins, oxycodone, advair, sulfa drugs  Past surgical history:  Skin cancer excision Hysterectomy Right ankle fracture ORIF Nasal septum surgery  Lumbar spine surgery Right knee arthroscopy Thyroidectomy  Social history: Denies use of nicotine product (smoking, vaping, patches, smokeless) Alcohol use: denies Denies recreational drug use   Physical Exam:  General: no acute distress, appears stated age Neurologic: alert, answering questions appropriately, following commands Respiratory: unlabored breathing on room air, symmetric chest rise Psychiatric: appropriate affect, normal cadence to speech   MSK (spine):  -Strength exam      Left  Right EHL    5/5  3/5 TA    5/5  3/5 GSC    5/5  5/5 Knee extension  5/5  5/5 Hip flexion   5/5  5/5  -Sensory exam    Sensation intact to light touch in L3-S1 nerve distributions of bilateral lower extremities  -Achilles DTR: 1/4 on the left, 1/4 on the right -Patellar  tendon DTR: 1/4 on the left, 1/4 on the right -No beats of clonus bilaterally  Imaging: XR of the thoracic spine from 07/05/2022 was independently reviewed and interpreted, showing T5 compression fracture with anterior height loss. No other fracture  seen. No dislocation or spondylolisthesis seen.   CT chest from 06/24/2022 was independently reviewed and interpreted showing a T5 compression fracture. No middle column extension or retropulsion. No other fractures seen.    Patient name: [name] Patient MRN: [id number] Date of visit: 07/05/22

## 2022-07-09 DIAGNOSIS — Z7952 Long term (current) use of systemic steroids: Secondary | ICD-10-CM | POA: Diagnosis not present

## 2022-07-09 DIAGNOSIS — M316 Other giant cell arteritis: Secondary | ICD-10-CM | POA: Diagnosis not present

## 2022-07-09 DIAGNOSIS — M25541 Pain in joints of right hand: Secondary | ICD-10-CM | POA: Diagnosis not present

## 2022-07-10 ENCOUNTER — Ambulatory Visit
Admission: RE | Admit: 2022-07-10 | Discharge: 2022-07-10 | Disposition: A | Discharge status: Home / Self Care | Payer: Medicare Other | Source: Ambulatory Visit | Attending: Family Medicine | Admitting: Family Medicine

## 2022-07-10 DIAGNOSIS — R911 Solitary pulmonary nodule: Secondary | ICD-10-CM | POA: Diagnosis not present

## 2022-07-10 DIAGNOSIS — J189 Pneumonia, unspecified organism: Secondary | ICD-10-CM

## 2022-07-10 DIAGNOSIS — M546 Pain in thoracic spine: Secondary | ICD-10-CM

## 2022-07-10 DIAGNOSIS — I700 Atherosclerosis of aorta: Secondary | ICD-10-CM | POA: Diagnosis not present

## 2022-07-15 ENCOUNTER — Telehealth: Payer: Self-pay | Admitting: Family Medicine

## 2022-07-15 NOTE — Telephone Encounter (Signed)
Contacted [name] to schedule their annual wellness visit. Appointment made for 07/29/2022.  Gabriel Cirri [facility] AWV TEAM Direct Dial [phone]

## 2022-07-18 ENCOUNTER — Other Ambulatory Visit: Payer: Self-pay | Admitting: Family Medicine

## 2022-07-18 DIAGNOSIS — J849 Interstitial pulmonary disease, unspecified: Secondary | ICD-10-CM

## 2022-07-18 NOTE — Progress Notes (Signed)
Discuss with daughter [name].  Will refer to pulmonary You have a referral in for [facility].  Please call them at [phone] to set up your appointment if you have not been contacted by their office within 3-5 business days.

## 2022-07-29 ENCOUNTER — Ambulatory Visit (INDEPENDENT_AMBULATORY_CARE_PROVIDER_SITE_OTHER): Payer: Medicare Other

## 2022-07-29 VITALS — Wt 192.0 lb

## 2022-07-29 DIAGNOSIS — Z0000 Encounter for general adult medical examination without abnormal findings: Secondary | ICD-10-CM

## 2022-07-29 NOTE — Patient Instructions (Signed)
[name] , Thank you for taking time to come for your Medicare Wellness Visit. I appreciate your ongoing commitment to your health goals. Please review the following plan we discussed and let me know if I can assist you in the future.   These are the goals we discussed:  Goals       Increase physical activity      Improve walking ability and distance.       No needs (pt-stated)      Care Coordination Interventions: Reviewed medications with patient and discussed purpose of all medications Reviewed scheduled/upcoming provider appointments including pending appointments and verified pt has completed AWV on 06/28/2021 and pending her next AWV 07/11/2022 with her primary provider Screening for signs and symptoms of depression related to chronic disease state  Assessed social determinant of health barriers        Patient Stated      Continue current healthy eating      Patient Stated      None at this time       Patient Stated      To be able to come off oxygen         This is a list of the screening recommended for you and due dates:  Health Maintenance  Topic Date Due   DEXA scan (bone density measurement)  06/09/2022   COVID-19 Vaccine (3 - Pfizer risk series) 11/04/2022*   Flu Shot  10/17/2022   Medicare Annual Wellness Visit  07/29/2023   DTaP/Tdap/Td vaccine (2 - Td or Tdap) 04/10/2025   Pneumonia Vaccine  Completed   Zoster (Shingles) Vaccine  Completed   HPV Vaccine  Aged Out  *Topic was postponed. The date shown is not the original due date.    Advanced directives: Please bring a copy of your health care power of attorney and living will to the office at your convenience.  Conditions/risks identified: to get off oxygen   Next appointment: Follow up in one year for your annual wellness visit    Preventive Care 65 Years and Older, Female Preventive care refers to lifestyle choices and visits with your health care provider that can promote health and  wellness. What does preventive care include? A yearly physical exam. This is also called an annual well check. Dental exams once or twice a year. Routine eye exams. Ask your health care provider how often you should have your eyes checked. Personal lifestyle choices, including: Daily care of your teeth and gums. Regular physical activity. Eating a healthy diet. Avoiding tobacco and drug use. Limiting alcohol use. Practicing safe sex. Taking low-dose aspirin every day. Taking vitamin and mineral supplements as recommended by your health care provider. What happens during an annual well check? The services and screenings done by your health care provider during your annual well check will depend on your age, overall health, lifestyle risk factors, and family history of disease. Counseling  Your health care provider may ask you questions about your: Alcohol use. Tobacco use. Drug use. Emotional well-being. Home and relationship well-being. Sexual activity. Eating habits. History of falls. Memory and ability to understand (cognition). Work and work Astronomer. Reproductive health. Screening  You may have the following tests or measurements: Height, weight, and BMI. Blood pressure. Lipid and cholesterol levels. These may be checked every 5 years, or more frequently if you are over 43 years old. Skin check. Lung cancer screening. You may have this screening every year starting at age 49 if you have a  30-pack-year history of smoking and currently smoke or have quit within the past 15 years. Fecal occult blood test (FOBT) of the stool. You may have this test every year starting at age 17. Flexible sigmoidoscopy or colonoscopy. You may have a sigmoidoscopy every 5 years or a colonoscopy every 10 years starting at age 100. Hepatitis C blood test. Hepatitis B blood test. Sexually transmitted disease (STD) testing. Diabetes screening. This is done by checking your blood sugar (glucose)  after you have not eaten for a while (fasting). You may have this done every 1-3 years. Bone density scan. This is done to screen for osteoporosis. You may have this done starting at age 41. Mammogram. This may be done every 1-2 years. Talk to your health care provider about how often you should have regular mammograms. Talk with your health care provider about your test results, treatment options, and if necessary, the need for more tests. Vaccines  Your health care provider may recommend certain vaccines, such as: Influenza vaccine. This is recommended every year. Tetanus, diphtheria, and acellular pertussis (Tdap, Td) vaccine. You may need a Td booster every 10 years. Zoster vaccine. You may need this after age 69. Pneumococcal 13-valent conjugate (PCV13) vaccine. One dose is recommended after age 63. Pneumococcal polysaccharide (PPSV23) vaccine. One dose is recommended after age 45. Talk to your health care provider about which screenings and vaccines you need and how often you need them. This information is not intended to replace advice given to you by your health care provider. Make sure you discuss any questions you have with your health care provider. Document Released: 03/31/2015 Document Revised: 11/22/2015 Document Reviewed: 01/03/2015 Elsevier Interactive Patient Education  2017 [employer].  Fall Prevention in the Home Falls can cause injuries. They can happen to people of all ages. There are many things you can do to make your home safe and to help prevent falls. What can I do on the outside of my home? Regularly fix the edges of walkways and driveways and fix any cracks. Remove anything that might make you trip as you walk through a door, such as a raised step or threshold. Trim any bushes or trees on the path to your home. Use bright outdoor lighting. Clear any walking paths of anything that might make someone trip, such as rocks or tools. Regularly check to see if  handrails are loose or broken. Make sure that both sides of any steps have handrails. Any raised decks and porches should have guardrails on the edges. Have any leaves, snow, or ice cleared regularly. Use sand or salt on walking paths during winter. Clean up any spills in your garage right away. This includes oil or grease spills. What can I do in the bathroom? Use night lights. Install grab bars by the toilet and in the tub and shower. Do not use towel bars as grab bars. Use non-skid mats or decals in the tub or shower. If you need to sit down in the shower, use a plastic, non-slip stool. Keep the floor dry. Clean up any water that spills on the floor as soon as it happens. Remove soap buildup in the tub or shower regularly. Attach bath mats securely with double-sided non-slip rug tape. Do not have throw rugs and other things on the floor that can make you trip. What can I do in the bedroom? Use night lights. Make sure that you have a light by your bed that is easy to reach. Do not use any sheets  or blankets that are too big for your bed. They should not hang down onto the floor. Have a firm chair that has side arms. You can use this for support while you get dressed. Do not have throw rugs and other things on the floor that can make you trip. What can I do in the kitchen? Clean up any spills right away. Avoid walking on wet floors. Keep items that you use a lot in easy-to-reach places. If you need to reach something above you, use a strong step stool that has a grab bar. Keep electrical cords out of the way. Do not use floor polish or wax that makes floors slippery. If you must use wax, use non-skid floor wax. Do not have throw rugs and other things on the floor that can make you trip. What can I do with my stairs? Do not leave any items on the stairs. Make sure that there are handrails on both sides of the stairs and use them. Fix handrails that are broken or loose. Make sure that  handrails are as long as the stairways. Check any carpeting to make sure that it is firmly attached to the stairs. Fix any carpet that is loose or worn. Avoid having throw rugs at the top or bottom of the stairs. If you do have throw rugs, attach them to the floor with carpet tape. Make sure that you have a light switch at the top of the stairs and the bottom of the stairs. If you do not have them, ask someone to add them for you. What else can I do to help prevent falls? Wear shoes that: Do not have high heels. Have rubber bottoms. Are comfortable and fit you well. Are closed at the toe. Do not wear sandals. If you use a stepladder: Make sure that it is fully opened. Do not climb a closed stepladder. Make sure that both sides of the stepladder are locked into place. Ask someone to hold it for you, if possible. Clearly mark and make sure that you can see: Any grab bars or handrails. First and last steps. Where the edge of each step is. Use tools that help you move around (mobility aids) if they are needed. These include: Canes. Walkers. Scooters. Crutches. Turn on the lights when you go into a dark area. Replace any light bulbs as soon as they burn out. Set up your furniture so you have a clear path. Avoid moving your furniture around. If any of your floors are uneven, fix them. If there are any pets around you, be aware of where they are. Review your medicines with your doctor. Some medicines can make you feel dizzy. This can increase your chance of falling. Ask your doctor what other things that you can do to help prevent falls. This information is not intended to replace advice given to you by your health care provider. Make sure you discuss any questions you have with your health care provider. Document Released: 12/29/2008 Document Revised: 08/10/2015 Document Reviewed: 04/08/2014 Elsevier Interactive Patient Education  2017 [employer].

## 2022-07-29 NOTE — Progress Notes (Signed)
I connected with  [name] on 07/29/22 by a audio enabled telemedicine application and verified that I am speaking with the correct person using two identifiers. Daughter [name]   Patient Location: Home  Provider Location: Office/Clinic  I discussed the limitations of evaluation and management by telemedicine. The patient expressed understanding and agreed to proceed.   Subjective:   [name] is a 89 y.o. female who presents for Medicare Annual (Subsequent) preventive examination.  Review of Systems     Cardiac Risk Factors include: advanced age (>37men, >36 women);sedentary lifestyle;hypertension     Objective:    Today's Vitals   07/29/22 1613  Weight: 192 lb (87.1 kg)   Body mass index is 34.01 kg/m.     07/29/2022    4:26 PM 06/24/2022   12:53 PM 12/21/2021   11:24 AM 06/28/2021   11:32 AM 05/10/2020   10:36 AM 03/14/2020    1:48 PM 01/11/2019   11:27 AM  Advanced Directives  Does Patient Have a Medical Advance Directive? Yes No Yes No Yes Yes Yes  Type of Estate agent of [place];Living will  Healthcare Power of [employer] of [place];Living will Healthcare Power of [place];Living will Healthcare Power of Attorney  Does patient want to make changes to medical advance directive?       No - Patient declined  Copy of Healthcare Power of Attorney in Chart? No - copy requested    No - copy requested  No - copy requested  Would patient like information on creating a medical advance directive?    No - Patient declined   No - Patient declined    Current Medications (verified) Outpatient Encounter Medications as of 07/29/2022  Medication Sig   albuterol (VENTOLIN HFA) 108 (90 Base) MCG/ACT inhaler USE 2 INHALATIONS UP TO EVERY 4 HOURS IF CANNOT CATCH YOUR BREATH   amLODipine (NORVASC) 2.5 MG tablet Take 1 tablet (2.5 mg total) by mouth daily.   aspirin EC 81 MG tablet Take by mouth.   budesonide-formoterol (SYMBICORT) 160-4.5  MCG/ACT inhaler Inhale 2 puffs into the lungs 2 (two) times daily.   calcium-vitamin D (OSCAL WITH D) 500-200 MG-UNIT tablet Take 1 tablet by mouth 3 (three) times daily.   cephALEXin (KEFLEX) 250 MG capsule Take 250 mg by mouth at bedtime.   Cholecalciferol (VITAMIN D3) 20 MCG (800 UNIT) TABS Take 1 tablet by mouth daily.   cyanocobalamin (VITAMIN B12) 1000 MCG/ML injection INJECT 1000 MCG WEEKLY FOR 4 WEEKS THEN MONTHLY THEREAFTER   diazepam (VALIUM) 5 MG tablet TAKE 1 TABLET AT BEDTIME AS NEEDED FOR ANXIETY   docusate sodium (COLACE) 100 MG capsule Take 100 mg by mouth 2 (two) times daily.   estradiol (ESTRACE) 0.1 MG/GM vaginal cream Place vaginally.   memantine (NAMENDA) 5 MG tablet Take 1 tablet (5 mg total) by mouth 2 (two) times daily.   Methen-Hyosc-Meth Blue-Na Phos (ME/NAPHOS/MB/HYO1) 81.6 MG TABS SMARTSIG:Tablet(s) By Mouth 2-3 Times Daily PRN   neomycin-colistin-hydrocortisone-thonzonium (CORTISPORIN-TC) 3.05-18-08-0.5 MG/ML OTIC suspension Place 3 drops into the right ear 4 (four) times daily.   olmesartan (BENICAR) 40 MG tablet TAKE 1 TABLET DAILY   omega-3 acid ethyl esters (LOVAZA) 1 g capsule TAKE 2 CAPSULES TWICE A DAY   polyethylene glycol (MIRALAX / GLYCOLAX) 17 g packet Take 17 g by mouth daily.   predniSONE (DELTASONE) 5 MG tablet Take 10 mg by mouth daily.   Probiotic Product (PROBIOTIC PO) Take by mouth daily.   RESTASIS 0.05 %  ophthalmic emulsion 1 drop 2 (two) times daily.   rOPINIRole (REQUIP) 1 MG tablet TAKE 1 TABLET IN THE MORNING AND 2 TABLETS IN THE EVENING   sertraline (ZOLOFT) 25 MG tablet Take 1 tablet (25 mg total) by mouth daily.   SYRINGE-NEEDLE, DISP, 3 ML (B-D 3CC LUER-LOK SYR 25GX1") 25G X 1" 3 ML MISC Use weekly x 4 weeks then monthly with B 12   tocilizumab (ACTEMRA) 400 MG/20ML SOLN injection    tocilizumab 4 mg/kg in sodium chloride 0.9 % Inject 280 mg into the vein.   traMADol (ULTRAM) 50 MG tablet TAKE ONE TABLET BY MOUTH EVERY 6 HOURS AS NEEDED FOR  MODERATE PAIN   No facility-administered encounter medications on file as of 07/29/2022.    Allergies (verified) Atorvastatin, Oxycodone, Statins, Advair diskus [fluticasone-salmeterol], and Sulfonamide derivatives   History: Past Medical History:  Diagnosis Date   Abdominal bloating    Abnormal LFTs (liver function tests)    Ankle fracture, bimalleolar, closed, right, initial encounter    Anxiety disorder    Arthritis    Asthma    Bimalleolar ankle fracture, right, closed, initial encounter 01/06/2019   Depression    Diverticulosis    Edema    Fatty liver    Fibromyalgia    GERD (gastroesophageal reflux disease)    Giant cell arteritis (HCC)    No biopsy secondary to steroid use   Hx of adenomatous colonic polyps    Hyperlipidemia    Hypertension    IBS (irritable bowel syndrome)    Lumbar radiculitis    Osteoporosis 09/2017   T score -2.6 left femoral neck   Peptic ulcer disease    Skin cancer    shoulder, leg, right eye (? squamous by resection)   Thoracic radiculitis    Thyroid nodule    "removed"   Urinary incontinence    Vertigo    Past Surgical History:  Procedure Laterality Date   ABDOMINAL HYSTERECTOMY     BSO   BACK SURGERY  2005   lower back   EYE SURGERY Right 2006   LIVER BIOPSY     fatty liver   NASAL SEPTUM SURGERY     ORIF ANKLE FRACTURE Right 01/11/2019   Procedure: OPEN REDUCTION INTERNAL FIXATION (ORIF) RIGHT BIMALLEOLAR ANKLE FRACTURE;  Surgeon: Tarry Kos, MD;  Location: [facility];  Service: Orthopedics;  Laterality: Right;   right knee surgery  2006   arthroscopy   right leg skin cancer surgery  2014   right shoulder skin cancer excision  2012   SKIN CANCER EXCISION     THYROIDECTOMY     Family History  Problem Relation Age of Onset   Colon polyps Maternal Aunt    Diabetes Father    Heart disease Father    Heart disease Mother    Lung disease Mother    COPD Mother    Lung cancer Brother    Cancer Brother         lung   COPD Brother    Stroke Brother    Lung cancer Sister    COPD Sister    Kidney disease Other        niece-mat side   Arthritis Other    Diabetes Son    Arthritis Daughter    Colon cancer Neg Hx    Social History   Socioeconomic History   Marital status: Widowed    Spouse name: Not on file   Number of children: 3  Years of education: Not on file   Highest education level: Not on file  Occupational History   Occupation: Retired Chief Financial Officer  Tobacco Use   Smoking status: Never   Smokeless tobacco: Never  Vaping Use   Vaping Use: Never used  Substance and Sexual Activity   Alcohol use: No   Drug use: No   Sexual activity: Not Currently    Comment: intercourser age 71, less than 5 secxual partners  Other Topics Concern   Not on file  Social History Narrative      children Maggie Schwalbe and Anita-pt lives w/Gary, but others are neighbors.   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   Patient takes a daily vitamin   She wears her seat belt. Exercises greater than 3 times a week.   Wears 2 hearing aids. No dentures.   "Sometimes "requires assistive device for walking, either a walker or wheelchair.   There is a smoke detector in her home. There are firearms in her home. She feels safe in her relationships.   Right handed   Social Determinants of Health   Financial Resource Strain: Low Risk  (07/29/2022)   Overall Financial Resource Strain (CARDIA)    Difficulty of Paying Living Expenses: Not hard at all  Food Insecurity: No Food Insecurity (07/29/2022)   Hunger Vital Sign    Worried About Running Out of Food in the Last Year: Never true    Ran Out of Food in the Last Year: Never true  Transportation Needs: No Transportation Needs (07/29/2022)   PRAPARE - Administrator, Civil Service (Medical): No    Lack of Transportation (Non-Medical): No  Physical Activity: Insufficiently Active (07/29/2022)   Exercise Vital Sign    Days of Exercise per Week: 5  days    Minutes of Exercise per Session: 10 min  Stress: No Stress Concern Present (07/29/2022)   [employer] of Occupational Health - Occupational Stress Questionnaire    Feeling of Stress : Not at all  Social Connections: Socially Isolated (07/29/2022)   Social Connection and Isolation Panel [NHANES]    Frequency of Communication with Friends and Family: Once a week    Frequency of Social Gatherings with Friends and Family: Never    Attends Religious Services: Never    Database administrator or Organizations: No    Attends Banker Meetings: Never    Marital Status: Widowed    Tobacco Counseling Counseling given: Not Answered   Clinical Intake:  Pre-visit preparation completed: Yes  Pain : No/denies pain     BMI - recorded: 34.01 Nutritional Status: BMI > 30  Obese Nutritional Risks: None Diabetes: No  How often do you need to have someone help you when you read instructions, pamphlets, or other written materials from your doctor or pharmacy?: 1 - Never  Diabetic?no  Interpreter Needed?: No  Information entered by :: Lanier Ensign, LPN   Activities of Daily Living    07/29/2022    4:28 PM  In your present state of health, do you have any difficulty performing the following activities:  Hearing? 1  Vision? 0  Difficulty concentrating or making decisions? 0  Walking or climbing stairs? 0  Comment avoid stairs  Dressing or bathing? 1  Comment daughter assistance  Doing errands, shopping? 0  Preparing Food and eating ? Y  Comment family  Using the Toilet? N  In the past six months, have you accidently leaked urine? Y  Comment wears a pad  Do you have problems with loss of bowel control? N  Managing your Medications? N  Managing your Finances? N  Housekeeping or managing your Housekeeping? N    Patient Care Team: Jeani Sow, MD as PCP - General (Family Medicine) Francee Gentile, MD (Internal Medicine) Pricilla Riffle, MD as  Consulting Physician (Cardiology) Hart Carwin, MD (Inactive) (Gastroenterology) Nyoka Cowden, MD as Consulting Physician (Pulmonary Disease) Kerrin Champagne, MD (Inactive) as Consulting Physician (Orthopedic Surgery) Tyrell Antonio, MD as Consulting Physician (Physical Medicine and Rehabilitation) [facility] (Dentistry) Crista Elliot, MD as Consulting Physician (Urology)  Indicate any recent Medical Services you may have received from other than [facility] providers in the past year (date may be approximate).     Assessment:   This is a routine wellness examination for Lala.  Hearing/Vision screen Hearing Screening - Comments:: Pt has hearing aids  Vision Screening - Comments:: Pt follows up with Dr Elmer Picker for annual eye exams   Dietary issues and exercise activities discussed: Current Exercise Habits: Home exercise routine, Type of exercise: stretching, Time (Minutes): 10, Frequency (Times/Week): 5, Weekly Exercise (Minutes/Week): 50   Goals Addressed             This Visit's Progress    Patient Stated       To be able to come off oxygen        Depression Screen    07/29/2022    4:23 PM 06/28/2022    3:54 PM 01/03/2022   11:20 AM 10/30/2021    2:09 PM 08/29/2021   12:59 PM 06/28/2021   11:31 AM 05/03/2021   11:12 AM  PHQ 2/9 Scores  PHQ - 2 Score 6  5 0 0 0 4  PHQ- 9 Score 10  20  0  8  Exception Documentation  Medical reason         Fall Risk    07/29/2022    4:28 PM 04/02/2022   10:52 AM 06/28/2021   11:33 AM 05/23/2021   11:49 AM 01/17/2021   11:39 AM  Fall Risk   Falls in the past year? 0 0 0 0 0  Number falls in past yr: 0 0 0 0 0  Injury with Fall? 0 0 0 0 0  Risk for fall due to : Impaired vision;Impaired balance/gait;Impaired mobility No Fall Risks Impaired vision;Impaired mobility;Impaired balance/gait  No Fall Risks  Follow up Falls prevention discussed Falls evaluation completed Falls prevention discussed  Falls evaluation  completed    FALL RISK PREVENTION PERTAINING TO THE HOME:  Any stairs in or around the home? Yes  If so, are there any without handrails? No  Home free of loose throw rugs in walkways, pet beds, electrical cords, etc? Yes  Adequate lighting in your home to reduce risk of falls? Yes   ASSISTIVE DEVICES UTILIZED TO PREVENT FALLS:  Life alert? yes Use of a cane, walker or w/c? Yes  Grab bars in the bathroom? Yes  Shower chair or bench in shower? Yes  Elevated toilet seat or a handicapped toilet? Yes   TIMED UP AND GO:  Was the test performed? No .   Cognitive Function:    07/15/2017   11:44 AM 07/12/2016   11:30 AM  MMSE - Mini Mental State Exam  Not completed:  Refused  Orientation to time 5   Orientation to Place 5   Registration 3   Attention/ Calculation 3   Recall 1   Language- name  2 objects 2   Language- repeat 0   Language- follow 3 step command 3   Language- read & follow direction 1   Write a sentence 1   Copy design 1   Total score 25         07/29/2022    4:31 PM 06/29/2021    1:24 PM 05/10/2020   10:48 AM  6CIT Screen  What Year? 0 points 0 points 0 points  What month? 0 points 0 points 0 points  What time? 0 points 0 points 0 points  Count back from 20 0 points 0 points 0 points  Months in reverse 4 points 0 points 0 points  Repeat phrase 10 points  8 points  Total Score 14 points  8 points    Immunizations Immunization History  Administered Date(s) Administered   Fluad Quad(high Dose 65+) 12/22/2018, 01/17/2020, 01/17/2021, 01/03/2022   Influenza Split 12/19/2011, 12/16/2012, 12/30/2013   Influenza Whole 12/17/2011   Influenza, High Dose Seasonal PF 12/30/2013, 12/12/2015, 12/24/2016, 01/01/2018   PFIZER(Purple Top)SARS-COV-2 Vaccination 10/25/2019, 11/15/2019   Pneumococcal Conjugate-13 12/17/2010   Pneumococcal Polysaccharide-23 06/29/2008, 12/30/2013   Tdap 04/11/2015   Zoster Recombinat (Shingrix) 09/09/2017, 01/01/2018    TDAP  status: Up to date  Flu Vaccine status: Up to date  Pneumococcal vaccine status: Up to date  Covid-19 vaccine status: Completed vaccines  Qualifies for Shingles Vaccine? Yes   Zostavax completed Yes   Shingrix Completed?: Yes  Screening Tests Health Maintenance  Topic Date Due   COVID-19 Vaccine (3 - Pfizer risk series) 11/04/2022 (Originally 12/13/2019)   DEXA SCAN  07/29/2023 (Originally 06/09/2022)   INFLUENZA VACCINE  10/17/2022   Medicare Annual Wellness (AWV)  07/29/2023   DTaP/Tdap/Td (2 - Td or Tdap) 04/10/2025   Pneumonia Vaccine 70+ Years old  Completed   Zoster Vaccines- Shingrix  Completed   HPV VACCINES  Aged Out    Health Maintenance  There are no preventive care reminders to display for this patient.   Colorectal cancer screening: No longer required.   Mammogram status: No longer required due to age .  Bone Density status: Completed 06/08/20. Results reflect: Bone density results: OSTEOPENIA. Repeat every 2 years.   Additional Screening:   Vision Screening: Recommended annual ophthalmology exams for early detection of glaucoma and other disorders of the eye. Is the patient up to date with their annual eye exam?  Yes  Who is the provider or what is the name of the office in which the patient attends annual eye exams? Dr Elmer Picker  If pt is not established with a provider, would they like to be referred to a provider to establish care? No .   Dental Screening: Recommended annual dental exams for proper oral hygiene  Community Resource Referral / Chronic Care Management: CRR required this visit?  No   CCM required this visit?  No      Plan:     I have personally reviewed and noted the following in the patient's chart:   Medical and social history Use of alcohol, tobacco or illicit drugs  Current medications and supplements including opioid prescriptions. Patient is not currently taking opioid prescriptions. Functional ability and status Nutritional  status Physical activity Advanced directives List of other physicians Hospitalizations, surgeries, and ER visits in previous 12 months Vitals Screenings to include cognitive, depression, and falls Referrals and appointments  In addition, I have reviewed and discussed with patient certain preventive protocols, quality metrics, and best practice recommendations. A written personalized care  plan for preventive services as well as general preventive health recommendations were provided to patient.     Marzella Schlein, LPN   1/61/0960   Nurse Notes: none

## 2022-08-01 ENCOUNTER — Other Ambulatory Visit (INDEPENDENT_AMBULATORY_CARE_PROVIDER_SITE_OTHER): Payer: Medicare Other

## 2022-08-01 ENCOUNTER — Ambulatory Visit (INDEPENDENT_AMBULATORY_CARE_PROVIDER_SITE_OTHER): Payer: Medicare Other | Admitting: Orthopedic Surgery

## 2022-08-01 DIAGNOSIS — M546 Pain in thoracic spine: Secondary | ICD-10-CM | POA: Diagnosis not present

## 2022-08-01 NOTE — Progress Notes (Signed)
Orthopedic Spine Surgery Office Note   Assessment: Patient is a 89 y.o. female with T5 compression fracture Date of injury: 06/21/2022 (~6 weeks from injury)     Plan: -Patient is no longer needing narcotics.  I told her to use Tylenol then for pain relief -I told her her compression fracture does not explain the nausea she has had for the past week.  Told her she should see her primary care doctor about that -Recommended continued tylenol and tramadol since that is taking care of her pain -I told her if she is no longer having any pain, then she does not need to follow up but if she is still have pain. she should return to office in 6 weeks, x-rays at next visit: AP/lateral thoracic     Patient expressed understanding of the plan and all questions were answered to the patient's satisfaction.    ___________________________________________________________________________     History:   Patient is a 89 y.o. female who presents today for thoracic spine.  Patient has been previously seen in the office for thoracic back pain.  She had a compression fracture.  Pain has been getting better.  She is no longer requiring tramadol for pain relief.  She is just using Tylenol.  She really only notes the pain when she is coughing or sneezing.  She does not have pain at any other time right now.  She is not having any pain radiating into her lower extremities.  Has not noticed any new unsteadiness or imbalance.  Has chronic urinary incontinence but no recent changes.  No bowel incontinence.  No saddle anesthesia.     Treatments tried: tramadol, Tylenol, activity modification     Physical Exam:   General: no acute distress, appears stated age Neurologic: alert, answering questions appropriately, following commands Respiratory: unlabored breathing on room air, symmetric chest rise Psychiatric: appropriate affect, normal cadence to speech     MSK (spine):   -Strength exam                                                    Left                  Right EHL                              5/5                  3/5 TA                                 5/5                  3/5 GSC                             5/5                  5/5 Knee extension            5/5                  5/5 Hip flexion  5/5                  5/5   -Sensory exam                           Sensation intact to light touch in L3-S1 nerve distributions of bilateral lower extremities   -Achilles DTR: 1/4 on the left, 1/4 on the right -Patellar tendon DTR: 1/4 on the left, 1/4 on the right -No beats of clonus bilaterally   Imaging: XR of the thoracic spine from 08/01/2022 was independently reviewed and interpreted, showing T5 compression fracture with slight increase in anterior height loss since films on 07/05/2022. No other fracture seen. No dislocation or spondylolisthesis seen.    CT chest from 06/24/2022 was previously independently reviewed and interpreted showing a T5 compression fracture. No middle column extension or retropulsion. No other fractures seen.      Patient name: [name] Patient MRN: [id number] Date of visit: 08/01/22

## 2022-08-06 DIAGNOSIS — M316 Other giant cell arteritis: Secondary | ICD-10-CM | POA: Diagnosis not present

## 2022-08-28 ENCOUNTER — Other Ambulatory Visit: Payer: Self-pay | Admitting: Family Medicine

## 2022-08-29 ENCOUNTER — Ambulatory Visit (INDEPENDENT_AMBULATORY_CARE_PROVIDER_SITE_OTHER): Payer: Medicare Other | Admitting: Pulmonary Disease

## 2022-08-29 ENCOUNTER — Encounter (HOSPITAL_BASED_OUTPATIENT_CLINIC_OR_DEPARTMENT_OTHER): Payer: Self-pay | Admitting: Pulmonary Disease

## 2022-08-29 VITALS — BP 124/60 | HR 77 | Temp 98.2°F | Ht 64.0 in | Wt 192.0 lb

## 2022-08-29 DIAGNOSIS — J9601 Acute respiratory failure with hypoxia: Secondary | ICD-10-CM

## 2022-08-29 DIAGNOSIS — J4530 Mild persistent asthma, uncomplicated: Secondary | ICD-10-CM

## 2022-08-29 DIAGNOSIS — R918 Other nonspecific abnormal finding of lung field: Secondary | ICD-10-CM

## 2022-08-29 NOTE — Patient Instructions (Addendum)
  Acute vs chronic hypoxemic respiratory failure - suspect this is acute and slowly improve with time --Unable to complete ambulatory O2 today due to drowsiness from recent home meds and no walker available --CONTINUE oxygen for now for goal SpO2 >88% --CONTINUE oxygen with activity and sleep --Will perform ambulatory test at next visit --Encourage more frequent walking at home with walker  Asthma --CONTINUE Symbicort 160-4.5 mcg TWO puffs in the morning and evening. Rinse mouth out after use --CONTINUE Albuterol AS NEEDED for shortness of breath or wheezing.   Abnormal CT -likely related to recent pneumonia. Discussed that this could be atypical infection such as MAC however not a good candidate for diagnostic testing or treatment --ORDER CT chest without contrast in 3 months for surveillance

## 2022-08-29 NOTE — Progress Notes (Signed)
Subjective:   PATIENT ID: [name] GENDER: female DOB: [birth year], MRN: [id number]  Chief Complaint  Patient presents with   Consult    Consult. Patient states she isn't feeling well. Here to talk about her O2.     Reason for Visit: New consult for low oxygen levels  Ms. Maycee Blasco is a 89 year old female never smoker with asthma, polymyagia rheumatica on Actemra, HTN, HLD, osteoporosis, GERD, fibromyalgia, anxiety/depression. Daughter present and provides all history.   She has had shortness of breath that worsens with activity for >1 year. Worsens with laying down. She had presented to the ED on 06/24/22 and CTA was concerning for possible pneumonia. She was hypoxemia and started on home O2. Denies chronic cough but occasional hacky cough thought related to sinus drainage. She was seen by her PCP for ED follow-up. Continued to need O2 so was referred to Pulmonary for further work-up. At home she walks with a walker. Sedentary at baseline. She snores at night. Denies witnessed apnea or gasping. She uses Symbicort and albuterol once a day, feels asthma is under control   Social History: Retired Conservation officer, historic buildings exposures: methotrexate in the past  I have personally reviewed patient's past medical/family/social history, allergies, current medications.  Past Medical History:  Diagnosis Date   Abdominal bloating    Abnormal LFTs (liver function tests)    Ankle fracture, bimalleolar, closed, right, initial encounter    Anxiety disorder    Arthritis    Asthma    Bimalleolar ankle fracture, right, closed, initial encounter 01/06/2019   Depression    Diverticulosis    Edema    Fatty liver    Fibromyalgia    GERD (gastroesophageal reflux disease)    Giant cell arteritis (HCC)    No biopsy secondary to steroid use   Hx of adenomatous colonic polyps    Hyperlipidemia    Hypertension    IBS (irritable bowel syndrome)    Lumbar radiculitis     Osteoporosis 09/2017   T score -2.6 left femoral neck   Peptic ulcer disease    Skin cancer    shoulder, leg, right eye (? squamous by resection)   Thoracic radiculitis    Thyroid nodule    "removed"   Urinary incontinence    Vertigo      Family History  Problem Relation Age of Onset   Colon polyps Maternal Aunt    Diabetes Father    Heart disease Father    Heart disease Mother    Lung disease Mother    COPD Mother    Lung cancer Brother    Cancer Brother        lung   COPD Brother    Stroke Brother    Lung cancer Sister    COPD Sister    Kidney disease Other        niece-mat side   Arthritis Other    Diabetes Son    Arthritis Daughter    Colon cancer Neg Hx      Social History   Occupational History   Occupation: Retired Chief Financial Officer  Tobacco Use   Smoking status: Never   Smokeless tobacco: Never  Vaping Use   Vaping Use: Never used  Substance and Sexual Activity   Alcohol use: No   Drug use: No   Sexual activity: Not Currently    Comment: intercourser age 24, less than 5 secxual partners    Allergies  Allergen Reactions  Atorvastatin Other (See Comments)    Severe headache   Oxycodone Itching    hallucinations   Statins    Advair Diskus [Fluticasone-Salmeterol] Other (See Comments)    Mouth sores   Sulfonamide Derivatives Rash     Outpatient Medications Prior to Visit  Medication Sig Dispense Refill   albuterol (VENTOLIN HFA) 108 (90 Base) MCG/ACT inhaler USE 2 INHALATIONS UP TO EVERY 4 HOURS IF CANNOT CATCH YOUR BREATH 18 g 1   amLODipine (NORVASC) 2.5 MG tablet Take 1 tablet (2.5 mg total) by mouth daily. 90 tablet 3   aspirin EC 81 MG tablet Take by mouth.     budesonide-formoterol (SYMBICORT) 160-4.5 MCG/ACT inhaler Inhale 2 puffs into the lungs 2 (two) times daily. 3 each 3   calcium-vitamin D (OSCAL WITH D) 500-200 MG-UNIT tablet Take 1 tablet by mouth 3 (three) times daily. 90 tablet 6   cephALEXin (KEFLEX) 250 MG capsule Take  250 mg by mouth at bedtime.     Cholecalciferol (VITAMIN D3) 20 MCG (800 UNIT) TABS Take 1 tablet by mouth daily. 90 tablet 3   cyanocobalamin (VITAMIN B12) 1000 MCG/ML injection INJECT 1000 MCG WEEKLY FOR 4 WEEKS THEN MONTHLY THEREAFTER 10 mL 3   diazepam (VALIUM) 5 MG tablet TAKE 1 TABLET AT BEDTIME AS NEEDED FOR ANXIETY 90 tablet 1   docusate sodium (COLACE) 100 MG capsule Take 100 mg by mouth 2 (two) times daily.     estradiol (ESTRACE) 0.1 MG/GM vaginal cream Place vaginally.     memantine (NAMENDA) 5 MG tablet Take 1 tablet (5 mg total) by mouth 2 (two) times daily. 180 tablet 1   Methen-Hyosc-Meth Blue-Na Phos (ME/NAPHOS/MB/HYO1) 81.6 MG TABS SMARTSIG:Tablet(s) By Mouth 2-3 Times Daily PRN     neomycin-colistin-hydrocortisone-thonzonium (CORTISPORIN-TC) 3.05-18-08-0.5 MG/ML OTIC suspension Place 3 drops into the right ear 4 (four) times daily. 10 mL 0   olmesartan (BENICAR) 40 MG tablet TAKE 1 TABLET DAILY 90 tablet 3   omega-3 acid ethyl esters (LOVAZA) 1 g capsule TAKE 2 CAPSULES TWICE A DAY 360 capsule 3   ondansetron (ZOFRAN-ODT) 8 MG disintegrating tablet PLACE 1 TABLET ON TONGUE EVERY 8 HOURS AS NEEDED FOR NAUSEA OR VOMITING 30 tablet 35   polyethylene glycol (MIRALAX / GLYCOLAX) 17 g packet Take 17 g by mouth daily.     predniSONE (DELTASONE) 5 MG tablet Take 10 mg by mouth daily.     Probiotic Product (PROBIOTIC PO) Take by mouth daily.     RESTASIS 0.05 % ophthalmic emulsion 1 drop 2 (two) times daily.     rOPINIRole (REQUIP) 1 MG tablet TAKE 1 TABLET IN THE MORNING AND 2 TABLETS IN THE EVENING 270 tablet 3   sertraline (ZOLOFT) 25 MG tablet Take 1 tablet (25 mg total) by mouth daily. 90 tablet 3   SYRINGE-NEEDLE, DISP, 3 ML (B-D 3CC LUER-LOK SYR 25GX1") 25G X 1" 3 ML MISC Use weekly x 4 weeks then monthly with B 12 50 each 0   tocilizumab (ACTEMRA) 400 MG/20ML SOLN injection      tocilizumab 4 mg/kg in sodium chloride 0.9 % Inject 280 mg into the vein.     traMADol (ULTRAM) 50 MG  tablet TAKE ONE TABLET BY MOUTH EVERY 6 HOURS AS NEEDED FOR MODERATE PAIN 40 tablet 0   No facility-administered medications prior to visit.    Review of Systems  Constitutional:  Negative for chills, diaphoresis, fever, malaise/fatigue and weight loss.  HENT:  Negative for congestion.   Respiratory:  Positive  for cough and shortness of breath. Negative for hemoptysis, sputum production and wheezing.   Cardiovascular:  Negative for chest pain, palpitations and leg swelling.     Objective:   Vitals:   08/29/22 1047  BP: 124/60  Pulse: 77  Temp: 98.2 F (36.8 C)  TempSrc: Oral  SpO2: 93%  Weight: 192 lb (87.1 kg)  Height: 5\' 4"  (1.626 m)   SpO2: 93 % O2 Device: None (Room air)  Physical Exam: General: Well-appearing, no acute distress HENT: [place], AT Eyes: EOMI, no scleral icterus Respiratory: Clear to auscultation bilaterally.  No crackles, wheezing or rales Cardiovascular: RRR, -M/R/G, no JVD Extremities:-Edema,-tenderness Neuro: AAO x4, CNII-XII grossly intact Psych: Normal mood, normal affect  Data Reviewed:  Imaging: CTA 06/24/22 - No PE. Upper lobe tree in bud nodularity CT Chest 07/10/22 - Mild patchy tree in bud in upper lobes bilaterally with mild cylindrical bronchiolectasis. New 4 mm RUL nodule  PFT: 11/27/12 FVC 2.20 (88%) FEV1 1.38 (74%) Ratio 63  TLC 82% DLCO 73% Interpretation: Mild obstructive defect with significant bronchodilator response. Mildly reduced DLCO  Labs: CBC    Component Value Date/Time   WBC 9.0 06/24/2022 1250   RBC 4.72 06/24/2022 1250   HGB 15.3 (H) 06/24/2022 1250   HCT 46.1 (H) 06/24/2022 1250   PLT 173 06/24/2022 1250   MCV 97.7 06/24/2022 1250   MCH 32.4 06/24/2022 1250   MCHC 33.2 06/24/2022 1250   RDW 12.4 06/24/2022 1250   LYMPHSABS 1.9 12/21/2021 1140   MONOABS 0.7 12/21/2021 1140   EOSABS 0.1 12/21/2021 1140   BASOSABS 0.1 12/21/2021 1140   Absolute eos 12/21/21 - 100    Assessment & Plan:   Discussion: 89 year  old female never smoker with asthma, polymyagia rheumatica on Actemra, HTN, HLD, osteoporosis, GERD, fibromyalgia, anxiety/depression who presents for evaluation for low oxygen levels.   Acute vs chronic hypoxemic respiratory failure - suspect this is acute and slowly improve with time --Unable to complete ambulatory O2 today due to drowsiness from recent home meds and no walker available --CONTINUE oxygen for now for goal SpO2 >88% --CONTINUE oxygen with activity and sleep --Will perform ambulatory test at next visit --Encourage more frequent walking at home with walker  Asthma --CONTINUE Symbicort 160-4.5 mcg TWO puffs in the morning and evening. Rinse mouth out after use --CONTINUE Albuterol AS NEEDED for shortness of breath or wheezing.   Abnormal CT -likely related to recent pneumonia. Discussed that this could be atypical infection such as MAC however not a good candidate for diagnostic testing or treatment. No stigmata of ILD Minimal bronchiectasis - may be related to recurrent infections +/- aspiration. No aspiration symptoms reported. Hold on swallow evaluation --ORDER CT chest without contrast in 3 months for surveillance  Health Maintenance Immunization History  Administered Date(s) Administered   Fluad Quad(high Dose 65+) 12/22/2018, 01/17/2020, 01/17/2021, 01/03/2022   Influenza Split 12/19/2011, 12/16/2012, 12/30/2013   Influenza Whole 12/17/2011   Influenza, High Dose Seasonal PF 12/30/2013, 12/12/2015, 12/24/2016, 01/01/2018   PFIZER(Purple Top)SARS-COV-2 Vaccination 10/25/2019, 11/15/2019   Pneumococcal Conjugate-13 12/17/2010   Pneumococcal Polysaccharide-23 06/29/2008, 12/30/2013   Tdap 04/11/2015   Zoster Recombinat (Shingrix) 09/09/2017, 01/01/2018   CT Lung Screen - not qualified. Age, insufficient tobacco hx  Orders Placed This Encounter  Procedures   CT Chest Wo Contrast    Standing Status:   Future    Standing Expiration Date:   08/29/2023    Scheduling  Instructions:     Schedule in 3 months (Sept  2024) with appointment with me after    Order Specific Question:   Preferred imaging location?    Answer:   [facility]  No orders of the defined types were placed in this encounter.   Return in about 3 months (around 11/29/2022).  I have spent a total time of 45 -minutes on the day of the appointment reviewing prior documentation, coordinating care and discussing medical diagnosis and plan with the patient/family. Imaging, labs and tests included in this note have been reviewed and interpreted independently by me.  Lillianna Sabel Mechele Collin, MD [facility] 08/29/2022 12:07 PM  Office Number [phone]

## 2022-09-04 ENCOUNTER — Other Ambulatory Visit: Payer: Self-pay | Admitting: Family Medicine

## 2022-09-05 DIAGNOSIS — M316 Other giant cell arteritis: Secondary | ICD-10-CM | POA: Diagnosis not present

## 2022-09-11 ENCOUNTER — Other Ambulatory Visit: Payer: Self-pay | Admitting: Family Medicine

## 2022-09-18 ENCOUNTER — Ambulatory Visit: Payer: Medicare Other | Admitting: Orthopedic Surgery

## 2022-09-26 DIAGNOSIS — N3020 Other chronic cystitis without hematuria: Secondary | ICD-10-CM | POA: Diagnosis not present

## 2022-09-26 DIAGNOSIS — N952 Postmenopausal atrophic vaginitis: Secondary | ICD-10-CM | POA: Diagnosis not present

## 2022-09-26 DIAGNOSIS — R300 Dysuria: Secondary | ICD-10-CM | POA: Diagnosis not present

## 2022-09-27 ENCOUNTER — Encounter: Payer: Self-pay | Admitting: Family Medicine

## 2022-09-27 ENCOUNTER — Ambulatory Visit (INDEPENDENT_AMBULATORY_CARE_PROVIDER_SITE_OTHER): Payer: Medicare Other | Admitting: Family Medicine

## 2022-09-27 VITALS — BP 132/42 | HR 79 | Temp 99.4°F | Resp 18 | Ht 64.0 in | Wt 179.5 lb

## 2022-09-27 DIAGNOSIS — I10 Essential (primary) hypertension: Secondary | ICD-10-CM | POA: Diagnosis not present

## 2022-09-27 DIAGNOSIS — R4181 Age-related cognitive decline: Secondary | ICD-10-CM

## 2022-09-27 DIAGNOSIS — F418 Other specified anxiety disorders: Secondary | ICD-10-CM | POA: Diagnosis not present

## 2022-09-27 DIAGNOSIS — M353 Polymyalgia rheumatica: Secondary | ICD-10-CM

## 2022-09-27 MED ORDER — SERTRALINE HCL 50 MG PO TABS: 50.0000 mg | ORAL_TABLET | Freq: Every day | ORAL | 1 refills | Status: DC

## 2022-09-27 NOTE — Patient Instructions (Addendum)
Increase the zoloft(sertraline) to 50mg   Rather than valium-see if can give a vitamin or something   B6 vitamin for nausea prevention but tell her for "nerves" rather than valium.   Retry namenda in day in 2 wks and see if better tolerated.

## 2022-09-27 NOTE — Progress Notes (Signed)
Subjective:     Patient ID: [name], female    DOB: 10-22-1929, 89 y.o.   MRN: [id number]  Chief Complaint  Patient presents with   Medical Management of Chronic Issues    3 month follow-up on htn and memory Not fasting     HPI She is accompanied by her daughter. Using wheelchair.   HTN - Pt is on Amlodipine 2.5 mg once daily and Olmesartan 40 mg daily. No ha/dizziness/cp/palp/edema/cough/sob  Depression - Taking Zoloft 25 mg. She notes she is sad often and begins crying. She states she gets easily upset if she witnesses someone getting upset or getting yelled at. Her daughter reports she has mood changes if she is left alone.   Memory - She has been having difficulty with her memory. Has trouble remembering names, remembers faces better.   Anxiety - She reports she has had increased anxiety lately with associated restlessness in her arms and legs. She asks for her valium often and is taking Valium 5 mg BID.   Nausea - Her daughter reports nausea with Namenda 5 mg. She stopped taking it in mornings and is now only taking once a day. States her nausea has since improved.   Polymyalgia - Taking Actemra and Prednisone 5 mg. Following up with Dr. Francee Gentile from rheumatology in [facility]. Last visit was 4/23.   Low O2 levels - Has continued to need O2. She followed up with [name] from pulmonology on 6/13. Next visit is scheduled on 9/13.    There are no preventive care reminders to display for this patient.  Past Medical History:  Diagnosis Date   Abdominal bloating    Abnormal LFTs (liver function tests)    Ankle fracture, bimalleolar, closed, right, initial encounter    Anxiety disorder    Arthritis    Asthma    Bimalleolar ankle fracture, right, closed, initial encounter 01/06/2019   Depression    Diverticulosis    Edema    Fatty liver    Fibromyalgia    GERD (gastroesophageal reflux disease)    Giant cell arteritis (HCC)    No biopsy secondary to  steroid use   Hx of adenomatous colonic polyps    Hyperlipidemia    Hypertension    IBS (irritable bowel syndrome)    Lumbar radiculitis    Osteoporosis 09/2017   T score -2.6 left femoral neck   Peptic ulcer disease    Skin cancer    shoulder, leg, right eye (? squamous by resection)   Thoracic radiculitis    Thyroid nodule    "removed"   Urinary incontinence    Vertigo     Past Surgical History:  Procedure Laterality Date   ABDOMINAL HYSTERECTOMY     BSO   BACK SURGERY  2005   lower back   EYE SURGERY Right 2006   LIVER BIOPSY     fatty liver   NASAL SEPTUM SURGERY     ORIF ANKLE FRACTURE Right 01/11/2019   Procedure: OPEN REDUCTION INTERNAL FIXATION (ORIF) RIGHT BIMALLEOLAR ANKLE FRACTURE;  Surgeon: Tarry Kos, MD;  Location: [facility];  Service: Orthopedics;  Laterality: Right;   right knee surgery  2006   arthroscopy   right leg skin cancer surgery  2014   right shoulder skin cancer excision  2012   SKIN CANCER EXCISION     THYROIDECTOMY       Current Outpatient Medications:    albuterol (VENTOLIN HFA) 108 (90 Base) MCG/ACT  inhaler, USE 2 INHALATIONS UP TO EVERY 4 HOURS IF CANNOT CATCH YOUR BREATH, Disp: 18 g, Rfl: 1   amLODipine (NORVASC) 2.5 MG tablet, TAKE 1 TABLET DAILY, Disp: 90 tablet, Rfl: 3   aspirin EC 81 MG tablet, Take by mouth., Disp: , Rfl:    budesonide-formoterol (SYMBICORT) 160-4.5 MCG/ACT inhaler, USE 2 INHALATIONS TWICE A DAY, Disp: 30.6 g, Rfl: 3   calcium-vitamin D (OSCAL WITH D) 500-200 MG-UNIT tablet, Take 1 tablet by mouth 3 (three) times daily., Disp: 90 tablet, Rfl: 6   cephALEXin (KEFLEX) 250 MG capsule, Take 250 mg by mouth at bedtime., Disp: , Rfl:    Cholecalciferol (VITAMIN D3) 20 MCG (800 UNIT) TABS, Take 1 tablet by mouth daily., Disp: 90 tablet, Rfl: 3   cyanocobalamin (VITAMIN B12) 1000 MCG/ML injection, INJECT 1000 MCG WEEKLY FOR 4 WEEKS THEN MONTHLY THEREAFTER, Disp: 10 mL, Rfl: 3   diazepam (VALIUM) 5 MG  tablet, TAKE 1 TABLET AT BEDTIME AS NEEDED FOR ANXIETY, Disp: 90 tablet, Rfl: 1   docusate sodium (COLACE) 100 MG capsule, Take 100 mg by mouth 2 (two) times daily., Disp: , Rfl:    estradiol (ESTRACE) 0.1 MG/GM vaginal cream, Place vaginally., Disp: , Rfl:    memantine (NAMENDA) 5 MG tablet, Take 1 tablet (5 mg total) by mouth 2 (two) times daily., Disp: 180 tablet, Rfl: 1   Methen-Hyosc-Meth Blue-Na Phos (ME/NAPHOS/MB/HYO1) 81.6 MG TABS, SMARTSIG:Tablet(s) By Mouth 2-3 Times Daily PRN, Disp: , Rfl:    neomycin-colistin-hydrocortisone-thonzonium (CORTISPORIN-TC) 3.05-18-08-0.5 MG/ML OTIC suspension, Place 3 drops into the right ear 4 (four) times daily., Disp: 10 mL, Rfl: 0   olmesartan (BENICAR) 40 MG tablet, TAKE 1 TABLET DAILY, Disp: 90 tablet, Rfl: 3   omega-3 acid ethyl esters (LOVAZA) 1 g capsule, TAKE 2 CAPSULES TWICE A DAY, Disp: 360 capsule, Rfl: 3   ondansetron (ZOFRAN-ODT) 8 MG disintegrating tablet, PLACE 1 TABLET ON TONGUE EVERY 8 HOURS AS NEEDED FOR NAUSEA OR VOMITING, Disp: 30 tablet, Rfl: 35   polyethylene glycol (MIRALAX / GLYCOLAX) 17 g packet, Take 17 g by mouth daily., Disp: , Rfl:    predniSONE (DELTASONE) 5 MG tablet, Take 10 mg by mouth daily., Disp: , Rfl:    Probiotic Product (PROBIOTIC PO), Take by mouth daily., Disp: , Rfl:    RESTASIS 0.05 % ophthalmic emulsion, 1 drop 2 (two) times daily., Disp: , Rfl:    rOPINIRole (REQUIP) 1 MG tablet, TAKE 1 TABLET IN THE MORNING AND 2 TABLETS IN THE EVENING, Disp: 270 tablet, Rfl: 3   SYRINGE-NEEDLE, DISP, 3 ML (B-D 3CC LUER-LOK SYR 25GX1") 25G X 1" 3 ML MISC, Use weekly x 4 weeks then monthly with B 12, Disp: 50 each, Rfl: 0   tocilizumab (ACTEMRA) 400 MG/20ML SOLN injection, , Disp: , Rfl:    tocilizumab 4 mg/kg in sodium chloride 0.9 %, Inject 280 mg into the vein., Disp: , Rfl:    traMADol (ULTRAM) 50 MG tablet, TAKE ONE TABLET BY MOUTH EVERY 6 HOURS AS NEEDED FOR MODERATE PAIN, Disp: 40 tablet, Rfl: 0   sertraline (ZOLOFT) 50 MG  tablet, Take 1 tablet (50 mg total) by mouth daily., Disp: 90 tablet, Rfl: 1  Allergies  Allergen Reactions   Atorvastatin Other (See Comments)    Severe headache   Oxycodone Itching    hallucinations   Statins    Advair Diskus [Fluticasone-Salmeterol] Other (See Comments)    Mouth sores   Sulfonamide Derivatives Rash   ROS neg/noncontributory except as noted HPI/below  Objective:     BP (!) 132/42 (Patient Position: Sitting, Cuff Size: Large)   Pulse 79   Temp 99.4 F (37.4 C) (Temporal)   Resp 18   Ht 5\' 4"  (1.626 m)   Wt 179 lb 8 oz (81.4 kg)   SpO2 94%   BMI 30.81 kg/m  Wt Readings from Last 3 Encounters:  09/27/22 179 lb 8 oz (81.4 kg)  08/29/22 192 lb (87.1 kg)  07/29/22 192 lb (87.1 kg)    Physical Exam   Gen: WDWN NAD HEENT: NCAT, conjunctiva not injected, sclera nonicteric NECK:  supple, no thyromegaly, no nodes, no carotid bruits CARDIAC: RRR, S1S2+, no murmur. DP 2+B LUNGS: CTAB. No wheezes ABDOMEN:  BS+, soft, NTND, No HSM, no masses EXT:  no edema MSK: no gross abnormalities.  NEURO: A&O x3.  CN II-XII intact.  PSYCH: normal mood. Good eye contact     Assessment & Plan:  There are no diagnoses linked to this encounter.  Return in about 3 months (around 12/28/2022) for HTN.   I,Rachel Rivera,acting as a scribe for Angelena Sole, MD.,have documented all relevant documentation on the behalf of Angelena Sole, MD,as directed by  Angelena Sole, MD while in the presence of Angelena Sole, MD.  I, Isabelle Course, have reviewed all documentation for this visit. The documentation on 09/27/22 for the exam, diagnosis, procedures, and orders are all accurate and complete.  *** Isabelle Course

## 2022-09-29 DIAGNOSIS — R4181 Age-related cognitive decline: Secondary | ICD-10-CM | POA: Insufficient documentation

## 2022-09-29 IMAGING — CT CT HEAD W/O CM
1 series · 15 of 30 positions shown, 19 images · non-contrast
Comparison: Brain MRI 02/25/2020.

CLINICAL DATA: [AGE] female with persistent headache for 8-10
months. Memory issues over the past 2 weeks.



[Series 2: head w/(date) · axial · 0.47mm/px · z∈[-160,-26]mm · 15 of 31 slices shown, 19 images]
[im 2/31  brain]
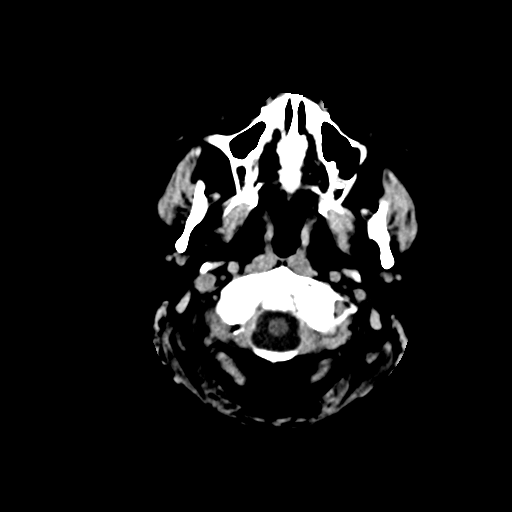
[im 2/31  bone]
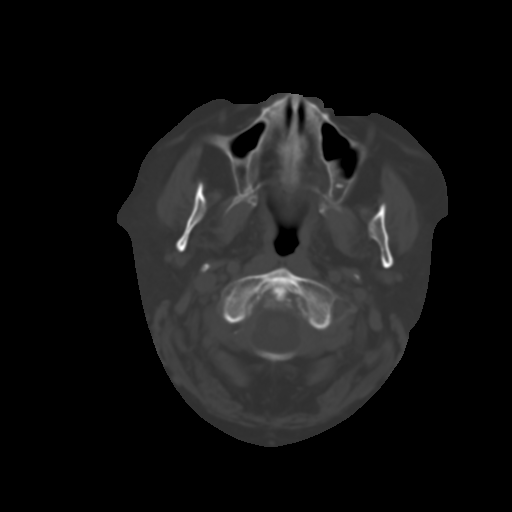
[im 4/31  brain]
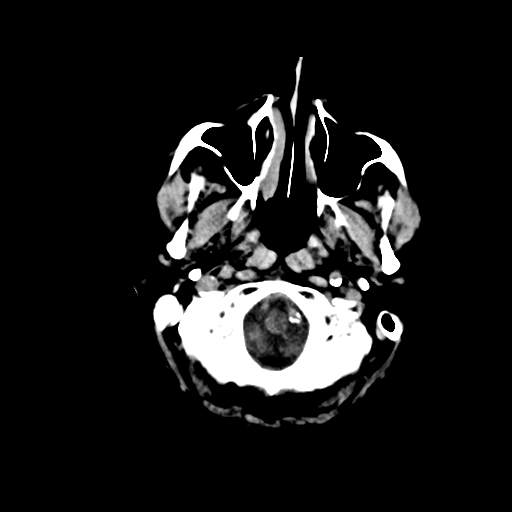
[im 6/31  brain]
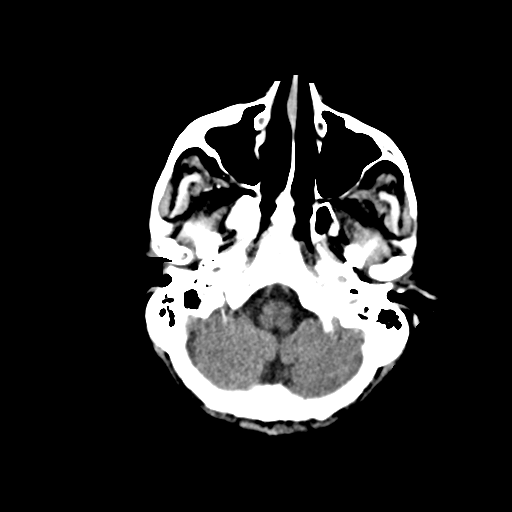
[im 8/31  brain]
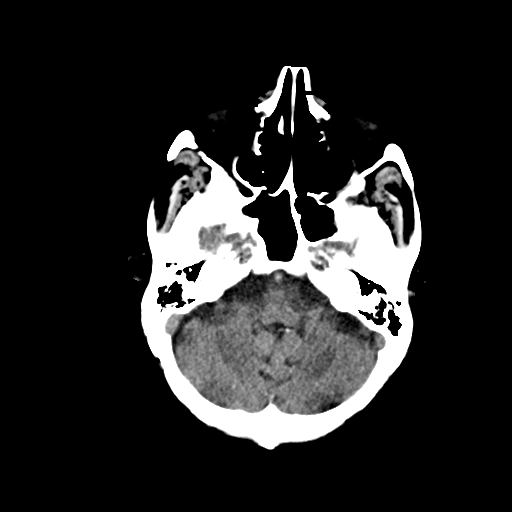
[im 10/31  brain]
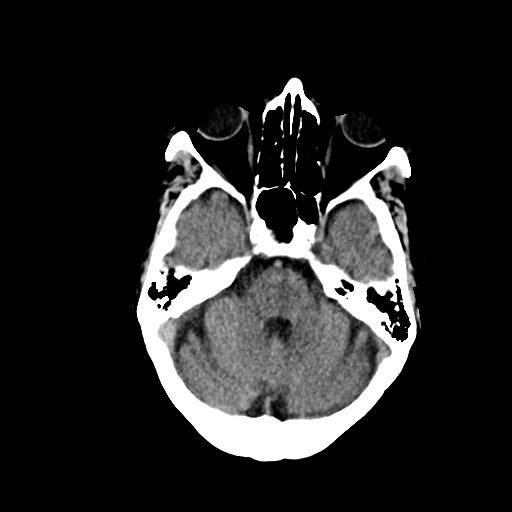
[im 10/31  bone]
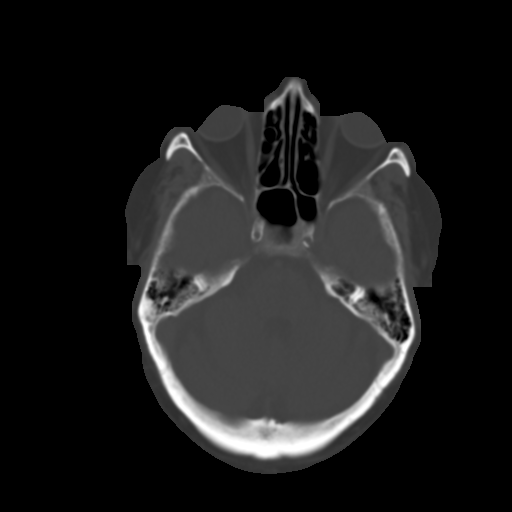
[im 12/31  brain]
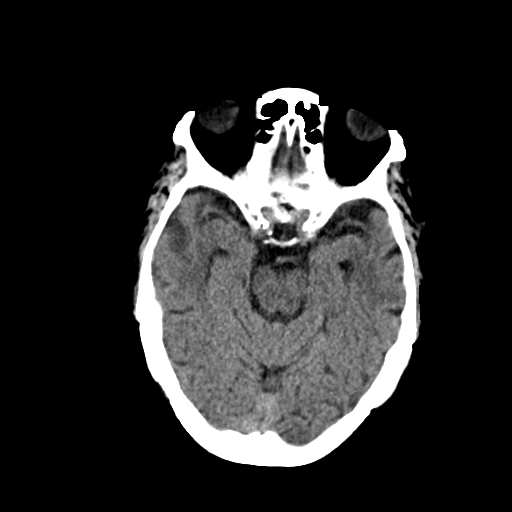
[im 14/31  brain]
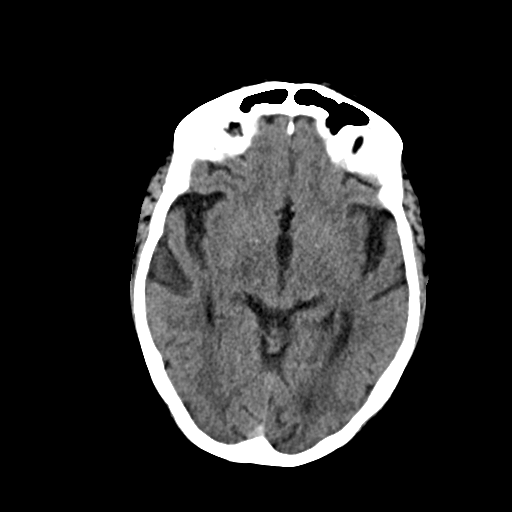
[im 16/31  brain]
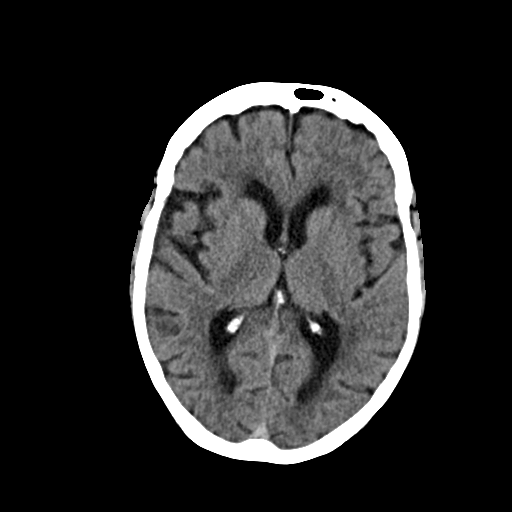
[im 17/31  brain]
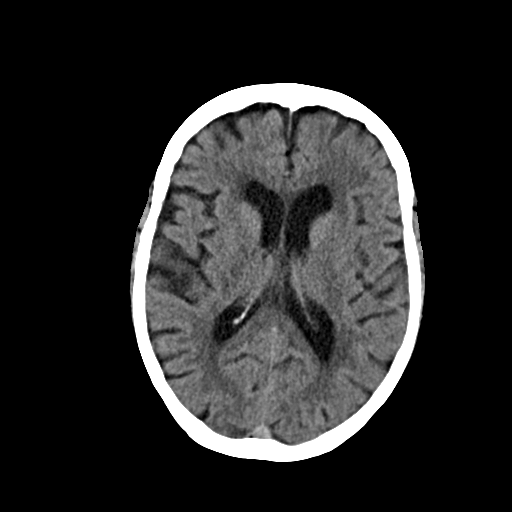
[im 17/31  bone]
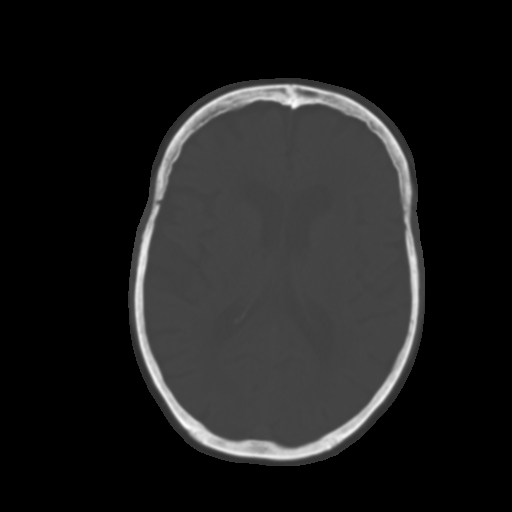
[im 19/31  brain]
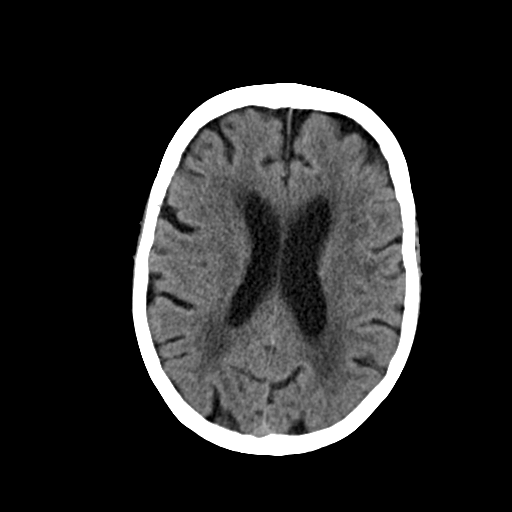
[im 21/31  brain]
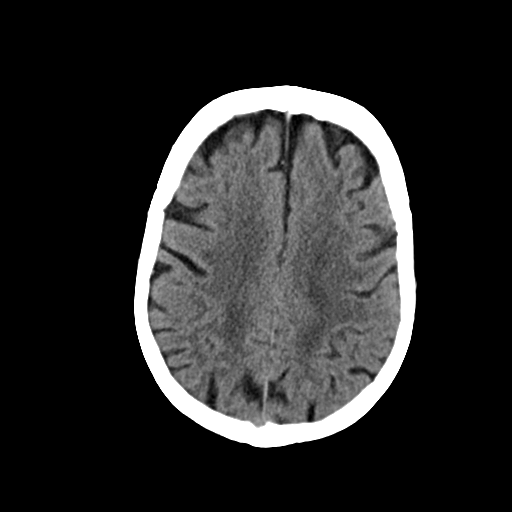
[im 23/31  brain]
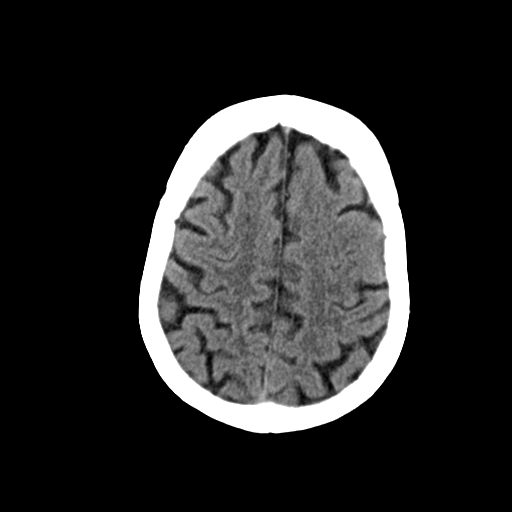
[im 25/31  brain]
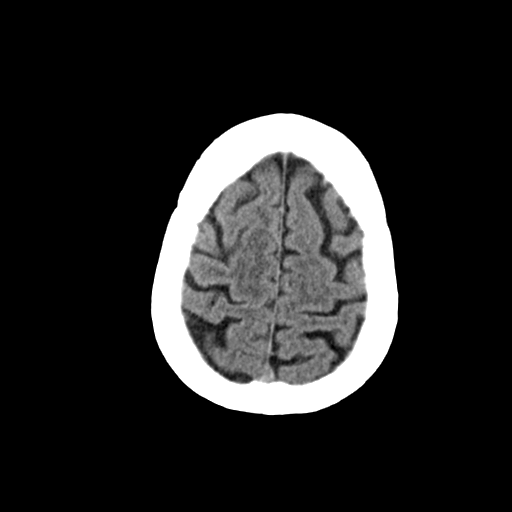
[im 25/31  bone]
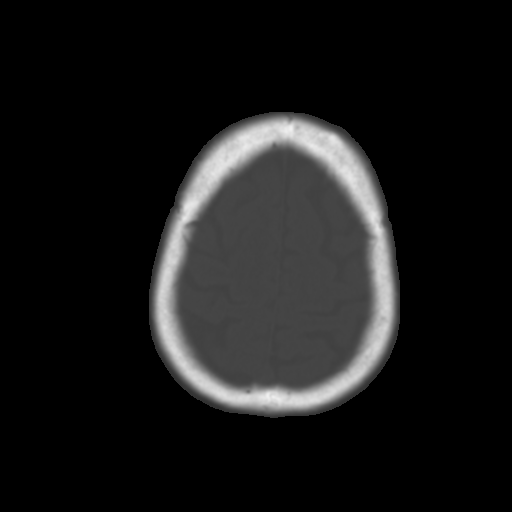
[im 27/31  brain]
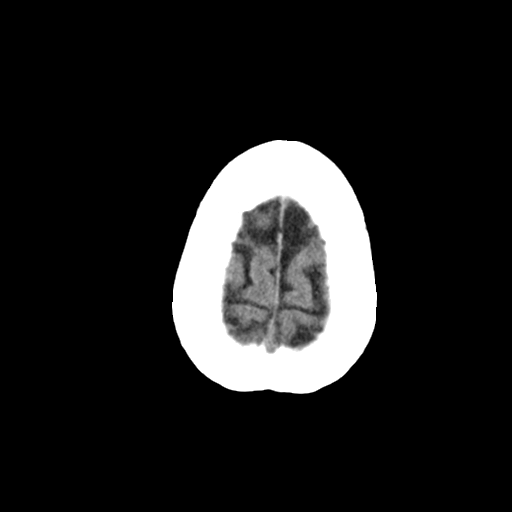
[im 29/31  brain]
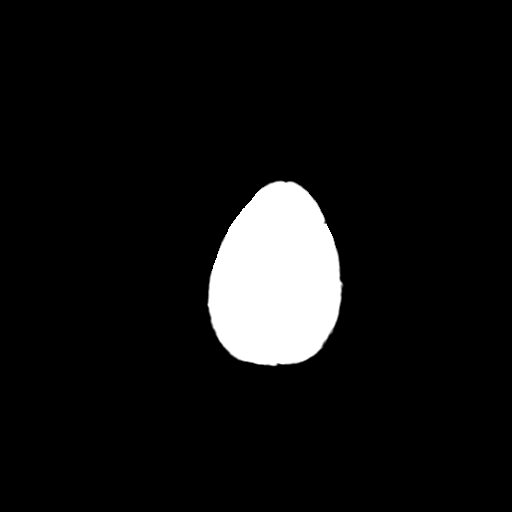

[15 of 30 positions shown; findings below may reference images not displayed]

FINDINGS: Brain: Cerebral volume appears stable since 4545 and within normal
limits for age. No midline shift, ventriculomegaly, mass effect,
evidence of mass lesion, intracranial hemorrhage or evidence of
cortically based acute infarction. Patchy mild to moderate chronic
cerebral white matter periventricular hypodensity appears stable
since 4545. No cortical encephalomalacia identified.

Vascular: Calcified atherosclerosis at the skull base. No suspicious
intracranial vascular hyperdensity.

Skull: No acute osseous abnormality identified.

Sinuses/Orbits: Visualized paranasal sinuses and mastoids are stable
and well aerated. Tympanic cavities appear clear.

Other: No acute orbit or scalp soft tissue finding.
IMPRESSION: 1. No acute intracranial abnormality.
2. Non contrast CT appearance of the brain appears stable since 4545
MRI, with mild to moderate for age chronic white matter changes.

## 2022-09-29 NOTE — Assessment & Plan Note (Signed)
Chronic.  Progressive.  Intolerant of several meds.  Will continue namenda 5mg  at hs.

## 2022-09-29 NOTE — Assessment & Plan Note (Signed)
Chronic.  Worsening.  Will increase zoloft to 50mg  daily.  Anxiety-not sure if valium really helping or mental.  Will try to sub B6 vitamin and see if pacify.  If not, then do the valium-pt high risk falls, etc.  Family aware

## 2022-09-29 NOTE — Assessment & Plan Note (Signed)
Chronic.  Controlled.  Continue amlodipine 2.5mg  and olmesartan 40mg  daily

## 2022-09-29 NOTE — Assessment & Plan Note (Signed)
Chronic.  Stable.  Managed by rheum.  On Actemera and prednisone

## 2022-09-30 ENCOUNTER — Other Ambulatory Visit: Payer: Self-pay | Admitting: Family Medicine

## 2022-10-04 IMAGING — CT CT ABD-PELV W/ CM
2 of 5 series · 16 of 46 positions shown, 18 images · IV contrast (agent unspecified)
Comparison: May 21, 2017

CLINICAL DATA: Constipation.

EXAM:
CT ABDOMEN AND PELVIS WITH CONTRAST
TECHNIQUE: Multidetector CT imaging of the abdomen and pelvis was performed
using the standard protocol following bolus administration of
intravenous contrast.

[Series 2: axial st · axial · 0.95mm/px · z∈[+198,+588]mm · 13 of 88 slices shown, 15 images]
[im 5/88  soft-tissue]
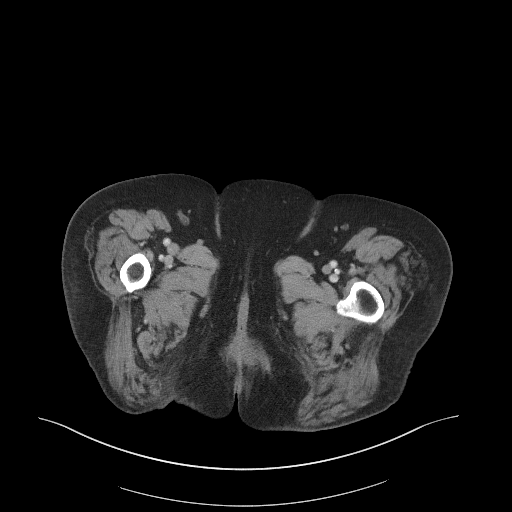
[im 5/88  bone]
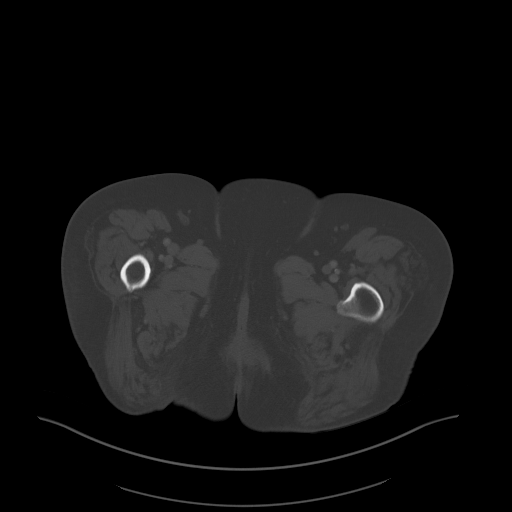
[im 14/88  soft-tissue]
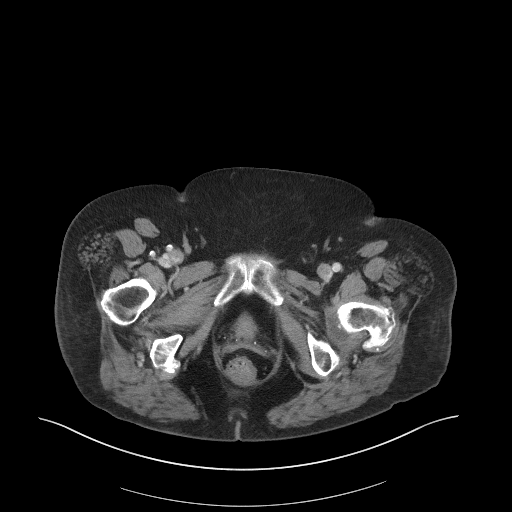
[im 18/88  soft-tissue]
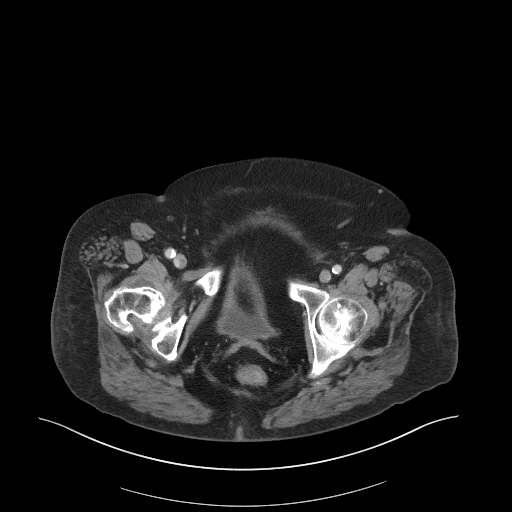
[im 27/88  soft-tissue]
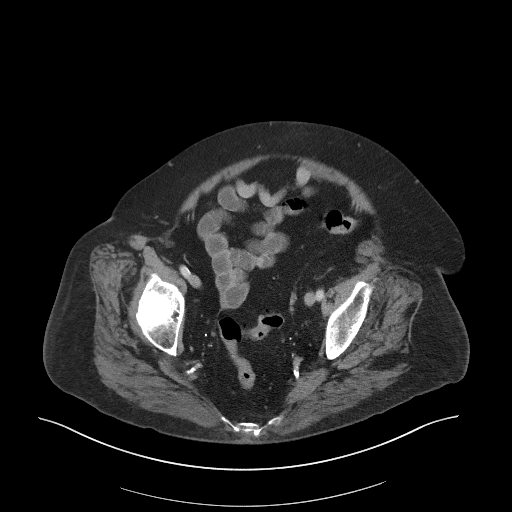
[im 31/88  soft-tissue]
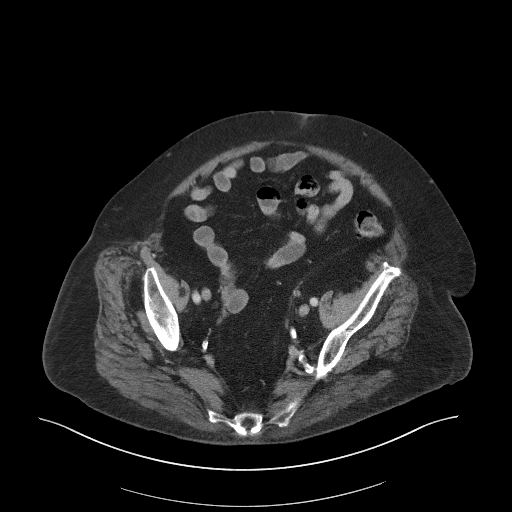
[im 40/88  soft-tissue]
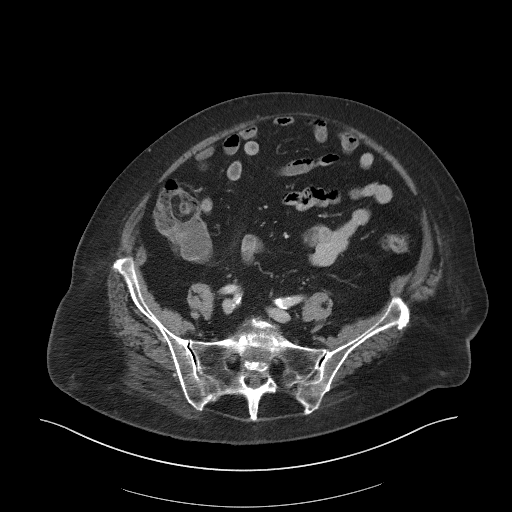
[im 44/88  soft-tissue]
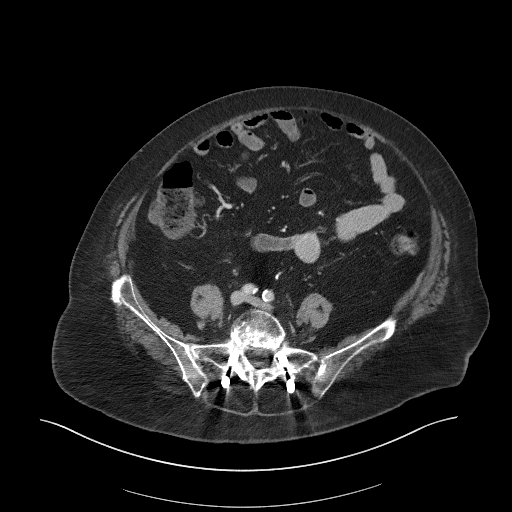
[im 48/88  soft-tissue]
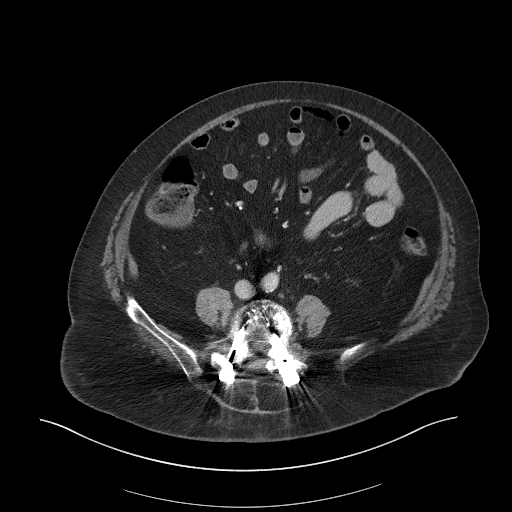
[im 57/88  soft-tissue]
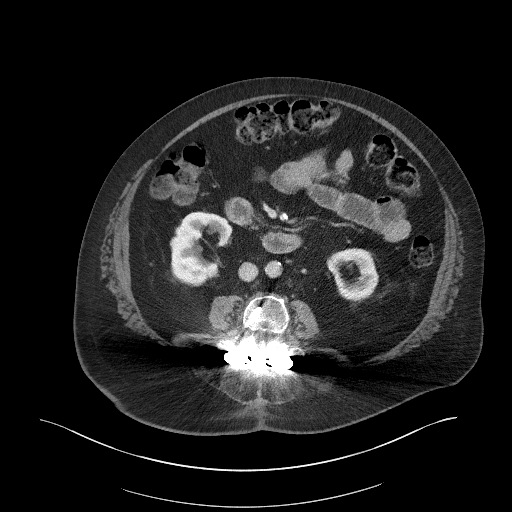
[im 57/88  bone]
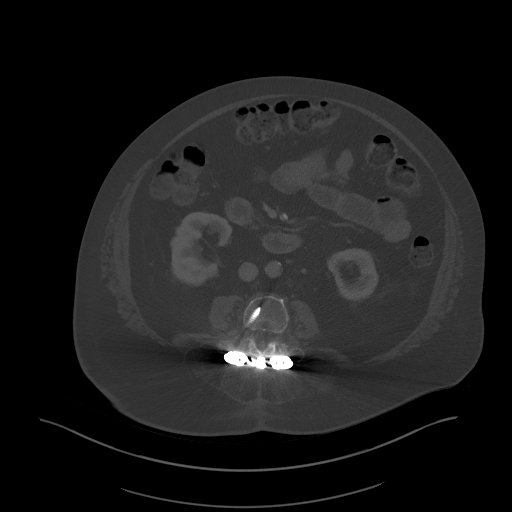
[im 61/88  soft-tissue]
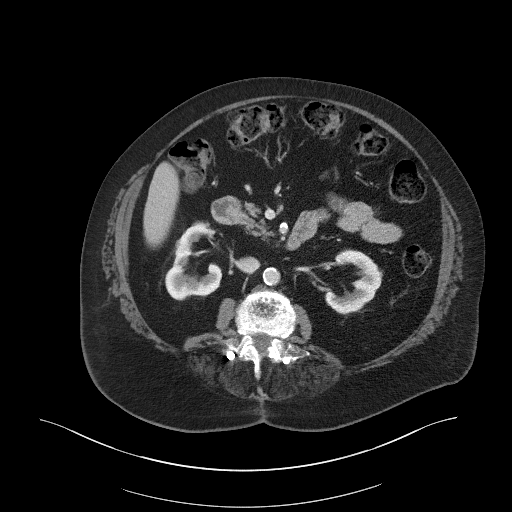
[im 70/88  soft-tissue]
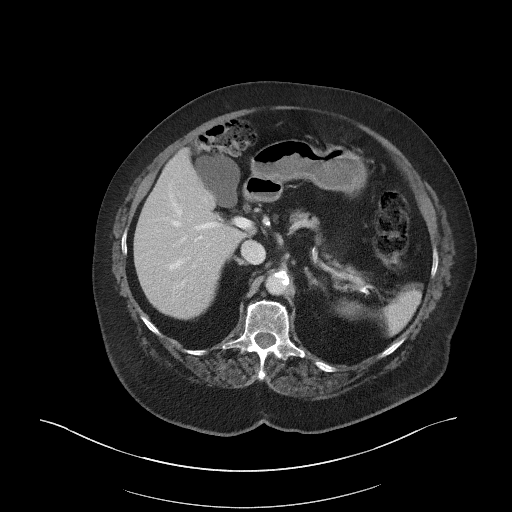
[im 74/88  soft-tissue]
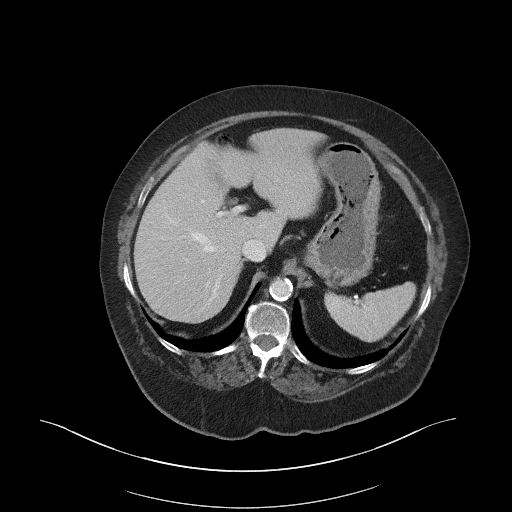
[im 83/88  soft-tissue]
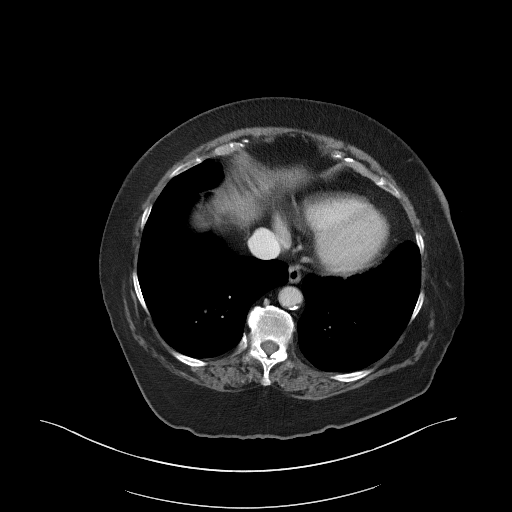

[Series 5: coronal st · coronal · 0.80mm/px · 3 of 115 slices shown]
[im 39/115  soft-tissue]
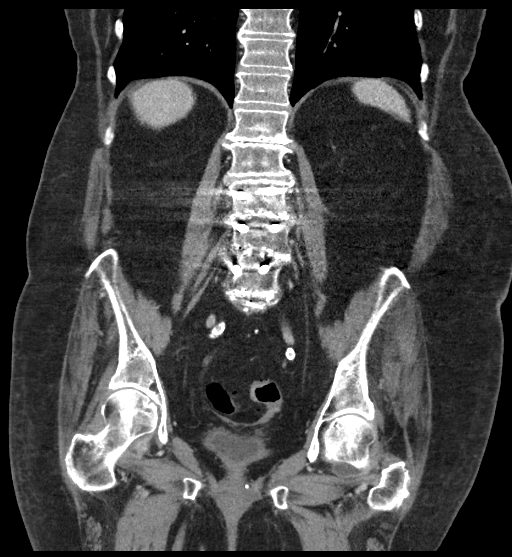
[im 51/115  soft-tissue]
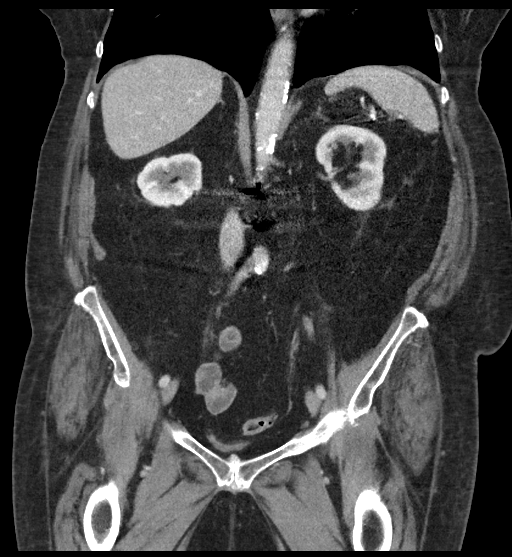
[im 64/115  soft-tissue]
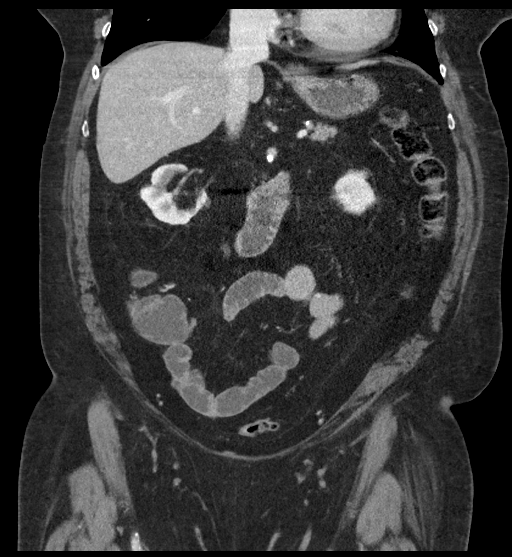

[16 of 46 positions shown; findings below may reference images not displayed]

RADIATION DOSE REDUCTION: This exam was performed according to the
departmental dose-optimization program which includes automated
exposure control, adjustment of the mA and/or kV according to
patient size and/or use of iterative reconstruction technique.

CONTRAST:  100mL OMNIPAQUE IOHEXOL 300 MG/ML  SOLN
FINDINGS: Lower chest: No acute abnormality.

Hepatobiliary: Stable subcentimeter cystic appearing areas are seen
within the right lobe of the liver. A punctate calcified granuloma
is noted within the inferior aspect of the right lobe of the liver.
No gallstones, gallbladder wall thickening, or biliary dilatation.

Pancreas: Unremarkable. No pancreatic ductal dilatation or
surrounding inflammatory changes.

Spleen: Normal in size without focal abnormality.

Adrenals/Urinary Tract: Adrenal glands are unremarkable. Kidneys are
normal in size and mildly lobulated in appearance, without renal
calculi, focal lesion, or hydronephrosis. The urinary bladder is
poorly distended and subsequently limited in evaluation.

Stomach/Bowel: Stomach is within normal limits. Appendix appears
normal. A mild-to-moderate amount of stool is seen throughout the
large bowel. No evidence of bowel wall thickening, distention, or
inflammatory changes. Noninflamed diverticula are seen throughout
the sigmoid colon.

Vascular/Lymphatic: Aortic atherosclerosis. No enlarged abdominal or
pelvic lymph nodes.

Reproductive: Status post hysterectomy. No adnexal masses.

Other: No abdominal wall hernia or abnormality. No abdominopelvic
ascites.

Musculoskeletal: Stable postoperative changes are seen at the levels
of L3, L4, L5 and S1.

Degenerative changes are noted throughout the remainder of the
lumbar spine.
IMPRESSION: 1. Mild-to-moderate stool burden without evidence of bowel
obstruction.
2. Sigmoid diverticulosis.
3. Stable postoperative changes within the lower lumbar spine.
4. Aortic atherosclerosis.

Aortic Atherosclerosis ([id number]).

## 2022-10-08 ENCOUNTER — Other Ambulatory Visit: Payer: Self-pay | Admitting: Family Medicine

## 2022-10-08 DIAGNOSIS — M316 Other giant cell arteritis: Secondary | ICD-10-CM | POA: Diagnosis not present

## 2022-10-08 DIAGNOSIS — I10 Essential (primary) hypertension: Secondary | ICD-10-CM

## 2022-11-08 DIAGNOSIS — M316 Other giant cell arteritis: Secondary | ICD-10-CM | POA: Diagnosis not present

## 2022-11-20 ENCOUNTER — Other Ambulatory Visit: Payer: Self-pay | Admitting: Family Medicine

## 2022-11-20 ENCOUNTER — Encounter: Payer: Self-pay | Admitting: Family Medicine

## 2022-11-20 MED ORDER — BUPROPION HCL ER (XL) 150 MG PO TB24: 150.0000 mg | ORAL_TABLET | Freq: Every day | ORAL | 1 refills | Status: DC

## 2022-11-26 ENCOUNTER — Encounter: Payer: Self-pay | Admitting: Family Medicine

## 2022-11-29 ENCOUNTER — Ambulatory Visit (HOSPITAL_BASED_OUTPATIENT_CLINIC_OR_DEPARTMENT_OTHER): Payer: Medicare Other | Admitting: Pulmonary Disease

## 2022-11-29 ENCOUNTER — Encounter (HOSPITAL_BASED_OUTPATIENT_CLINIC_OR_DEPARTMENT_OTHER): Payer: Self-pay | Admitting: Pulmonary Disease

## 2022-11-29 ENCOUNTER — Ambulatory Visit (HOSPITAL_BASED_OUTPATIENT_CLINIC_OR_DEPARTMENT_OTHER)
Admission: RE | Admit: 2022-11-29 | Discharge: 2022-11-29 | Disposition: A | Discharge status: Home / Self Care | Payer: Medicare Other | Source: Ambulatory Visit | Attending: Pulmonary Disease | Admitting: Pulmonary Disease

## 2022-11-29 VITALS — BP 154/64 | HR 72 | Ht 64.0 in | Wt 179.0 lb

## 2022-11-29 DIAGNOSIS — R918 Other nonspecific abnormal finding of lung field: Secondary | ICD-10-CM | POA: Insufficient documentation

## 2022-11-29 DIAGNOSIS — J9611 Chronic respiratory failure with hypoxia: Secondary | ICD-10-CM

## 2022-11-29 DIAGNOSIS — J4530 Mild persistent asthma, uncomplicated: Secondary | ICD-10-CM | POA: Diagnosis not present

## 2022-11-29 DIAGNOSIS — I700 Atherosclerosis of aorta: Secondary | ICD-10-CM | POA: Diagnosis not present

## 2022-11-29 NOTE — Progress Notes (Unsigned)
Subjective:   PATIENT ID: [name] GENDER: female DOB: 02/01/1930, MRN: [id number]  Chief Complaint  Patient presents with   Follow-up    Reason for Visit: New consult for low oxygen levels  [name] is a 89 year old female never smoker with asthma, polymyagia rheumatica on Actemra, HTN, HLD, osteoporosis, GERD, fibromyalgia, anxiety/depression. Daughter present and provides all history.   She has had shortness of breath that worsens with activity for >1 year. Worsens with laying down. She had presented to the ED on 06/24/22 and CTA was concerning for possible pneumonia. She was hypoxemia and started on home O2. Denies chronic cough but occasional hacky cough thought related to sinus drainage. She was seen by her PCP for ED follow-up. Continued to need O2 so was referred to Pulmonary for further work-up. At home she walks with a walker. Sedentary at baseline. She snores at night. Denies witnessed apnea or gasping. She uses Symbicort and albuterol once a day, feels asthma is under control   Social History: Retired Conservation officer, historic buildings exposures: methotrexate in the past  I have personally reviewed patient's past medical/family/social history, allergies, current medications.  Past Medical History:  Diagnosis Date   Abdominal bloating    Abnormal LFTs (liver function tests)    Ankle fracture, bimalleolar, closed, right, initial encounter    Anxiety disorder    Arthritis    Asthma    Bimalleolar ankle fracture, right, closed, initial encounter 01/06/2019   Depression    Diverticulosis    Edema    Fatty liver    Fibromyalgia    GERD (gastroesophageal reflux disease)    Giant cell arteritis (HCC)    No biopsy secondary to steroid use   Hx of adenomatous colonic polyps    Hyperlipidemia    Hypertension    IBS (irritable bowel syndrome)    Lumbar radiculitis    Osteoporosis 09/2017   T score -2.6 left femoral neck   Peptic ulcer disease    Skin  cancer    shoulder, leg, right eye (? squamous by resection)   Thoracic radiculitis    Thyroid nodule    "removed"   Urinary incontinence    Vertigo      Family History  Problem Relation Age of Onset   Colon polyps Maternal Aunt    Diabetes Father    Heart disease Father    Heart disease Mother    Lung disease Mother    COPD Mother    Lung cancer Brother    Cancer Brother        lung   COPD Brother    Stroke Brother    Lung cancer Sister    COPD Sister    Kidney disease Other        niece-mat side   Arthritis Other    Diabetes Son    Arthritis Daughter    Colon cancer Neg Hx      Social History   Occupational History   Occupation: Retired Chief Financial Officer  Tobacco Use   Smoking status: Never   Smokeless tobacco: Never  Vaping Use   Vaping status: Never Used  Substance and Sexual Activity   Alcohol use: No   Drug use: No   Sexual activity: Not Currently    Comment: intercourser age 32, less than 5 secxual partners    Allergies  Allergen Reactions   Atorvastatin Other (See Comments)    Severe headache   Oxycodone Itching    hallucinations  Statins    Advair Diskus [Fluticasone-Salmeterol] Other (See Comments)    Mouth sores   Sulfonamide Derivatives Rash     Outpatient Medications Prior to Visit  Medication Sig Dispense Refill   albuterol (VENTOLIN HFA) 108 (90 Base) MCG/ACT inhaler USE 2 INHALATIONS UP TO EVERY 4 HOURS IF CANNOT CATCH YOUR BREATH 18 g 1   amLODipine (NORVASC) 2.5 MG tablet TAKE 1 TABLET DAILY 90 tablet 3   aspirin EC 81 MG tablet Take by mouth.     budesonide-formoterol (SYMBICORT) 160-4.5 MCG/ACT inhaler USE 2 INHALATIONS TWICE A DAY 30.6 g 3   buPROPion (WELLBUTRIN XL) 150 MG 24 hr tablet Take 1 tablet (150 mg total) by mouth daily. 30 tablet 1   calcium-vitamin D (OSCAL WITH D) 500-200 MG-UNIT tablet Take 1 tablet by mouth 3 (three) times daily. 90 tablet 6   cephALEXin (KEFLEX) 250 MG capsule Take 250 mg by mouth at  bedtime.     Cholecalciferol (VITAMIN D3) 20 MCG (800 UNIT) TABS Take 1 tablet by mouth daily. 90 tablet 3   cyanocobalamin (VITAMIN B12) 1000 MCG/ML injection INJECT 1000 MCG WEEKLY FOR 4 WEEKS THEN MONTHLY THEREAFTER 10 mL 3   diazepam (VALIUM) 5 MG tablet TAKE 1 TABLET AT BEDTIME AS NEEDED FOR ANXIETY 90 tablet 1   docusate sodium (COLACE) 100 MG capsule Take 100 mg by mouth 2 (two) times daily.     estradiol (ESTRACE) 0.1 MG/GM vaginal cream Place vaginally.     memantine (NAMENDA) 5 MG tablet TAKE 1 TABLET TWICE A DAY 180 tablet 3   Methen-Hyosc-Meth Blue-Na Phos (ME/NAPHOS/MB/HYO1) 81.6 MG TABS SMARTSIG:Tablet(s) By Mouth 2-3 Times Daily PRN     neomycin-colistin-hydrocortisone-thonzonium (CORTISPORIN-TC) 3.05-18-08-0.5 MG/ML OTIC suspension Place 3 drops into the right ear 4 (four) times daily. 10 mL 0   olmesartan (BENICAR) 40 MG tablet TAKE 1 TABLET DAILY 90 tablet 3   omega-3 acid ethyl esters (LOVAZA) 1 g capsule TAKE 2 CAPSULES TWICE A DAY 360 capsule 3   ondansetron (ZOFRAN-ODT) 8 MG disintegrating tablet PLACE 1 TABLET ON TONGUE EVERY 8 HOURS AS NEEDED FOR NAUSEA OR VOMITING 30 tablet 35   polyethylene glycol (MIRALAX / GLYCOLAX) 17 g packet Take 17 g by mouth daily.     predniSONE (DELTASONE) 5 MG tablet Take 10 mg by mouth daily.     Probiotic Product (PROBIOTIC PO) Take by mouth daily.     RESTASIS 0.05 % ophthalmic emulsion 1 drop 2 (two) times daily.     rOPINIRole (REQUIP) 1 MG tablet TAKE 1 TABLET IN THE MORNING AND 2 TABLETS IN THE EVENING 270 tablet 3   sertraline (ZOLOFT) 50 MG tablet Take 1 tablet (50 mg total) by mouth daily. 90 tablet 1   SYRINGE-NEEDLE, DISP, 3 ML (B-D 3CC LUER-LOK SYR 25GX1") 25G X 1" 3 ML MISC Use weekly x 4 weeks then monthly with B 12 50 each 0   tocilizumab (ACTEMRA) 400 MG/20ML SOLN injection      tocilizumab 4 mg/kg in sodium chloride 0.9 % Inject 280 mg into the vein.     traMADol (ULTRAM) 50 MG tablet TAKE ONE TABLET BY MOUTH EVERY 6 HOURS AS  NEEDED FOR MODERATE PAIN 40 tablet 0   No facility-administered medications prior to visit.    Review of Systems  Constitutional:  Negative for chills, diaphoresis, fever, malaise/fatigue and weight loss.  HENT:  Negative for congestion.   Respiratory:  Positive for cough and shortness of breath. Negative for hemoptysis, sputum production and  wheezing.   Cardiovascular:  Negative for chest pain, palpitations and leg swelling.     Objective:   Vitals:   11/29/22 1410 11/29/22 1501  BP: (!) 164/62 (!) 154/64  Pulse: 72   SpO2: 94%   Weight: 179 lb (81.2 kg)   Height: 5\' 4"  (1.626 m)    SpO2: 94 %  Physical Exam: General: Well-appearing, no acute distress HENT: [place], AT Eyes: EOMI, no scleral icterus Respiratory: Clear to auscultation bilaterally.  No crackles, wheezing or rales Cardiovascular: RRR, -M/R/G, no JVD Extremities:-Edema,-tenderness Neuro: AAO x4, CNII-XII grossly intact Psych: Normal mood, normal affect  Data Reviewed:  Imaging: CTA 06/24/22 - No PE. Upper lobe tree in bud nodularity CT Chest 07/10/22 - Mild patchy tree in bud in upper lobes bilaterally with mild cylindrical bronchiolectasis. New 4 mm RUL nodule CT Chest 11/29/22  PFT: 11/27/12 FVC 2.20 (88%) FEV1 1.38 (74%) Ratio 63  TLC 82% DLCO 73% Interpretation: Mild obstructive defect with significant bronchodilator response. Mildly reduced DLCO  Labs: CBC    Component Value Date/Time   WBC 9.0 06/24/2022 1250   RBC 4.72 06/24/2022 1250   HGB 15.3 (H) 06/24/2022 1250   HCT 46.1 (H) 06/24/2022 1250   PLT 173 06/24/2022 1250   MCV 97.7 06/24/2022 1250   MCH 32.4 06/24/2022 1250   MCHC 33.2 06/24/2022 1250   RDW 12.4 06/24/2022 1250   LYMPHSABS 1.9 12/21/2021 1140   MONOABS 0.7 12/21/2021 1140   EOSABS 0.1 12/21/2021 1140   BASOSABS 0.1 12/21/2021 1140   Absolute eos 12/21/21 - 100    Assessment & Plan:   Discussion: 89 year old female never smoker with asthma, polymyagia rheumatica on  Actemra, HTN, HLD, osteoporosis, GERD, fibromyalgia, anxiety/depression who presents for evaluation for low oxygen levels.    Chronic hypoxemic respiratory failure - suspect this is acute and slowly improve with time --CONTINUE oxygen for now for goal SpO2 >88% --CONTINUE oxygen with activity and sleep --Encourage more frequent walking at home with walker --Will need ambulatory O2 to recertify oxygen in March/April 2025  Asthma --CONTINUE Symbicort 160-4.5 mcg TWO puffs in the morning and evening. Rinse mouth out after use --CONTINUE Albuterol AS NEEDED for shortness of breath or wheezing.   Abnormal CT -likely related to recent pneumonia. Discussed that this could be atypical infection such as MAC however not a good candidate for diagnostic testing or treatment. No stigmata of ILD Minimal bronchiectasis - may be related to recurrent infections +/- aspiration. No aspiration symptoms reported. Hold on swallow evaluation --Reviewed CT Chest overall stable. No indication for bronchoscopy at this time  Health Maintenance Immunization History  Administered Date(s) Administered   Fluad Quad(high Dose 65+) 12/22/2018, 01/17/2020, 01/17/2021, 01/03/2022   Influenza Split 12/19/2011, 12/16/2012, 12/30/2013   Influenza Whole 12/17/2011   Influenza, High Dose Seasonal PF 12/30/2013, 12/12/2015, 12/24/2016, 01/01/2018   PFIZER(Purple Top)SARS-COV-2 Vaccination 10/25/2019, 11/15/2019   Pneumococcal Conjugate-13 12/17/2010   Pneumococcal Polysaccharide-23 06/29/2008, 12/30/2013   Tdap 04/11/2015   Zoster Recombinant(Shingrix) 09/09/2017, 01/01/2018   CT Lung Screen - not qualified. Age, insufficient tobacco hx  No orders of the defined types were placed in this encounter. No orders of the defined types were placed in this encounter.   No follow-ups on file.  I have spent a total time of 45 -minutes on the day of the appointment reviewing prior documentation, coordinating care and discussing  medical diagnosis and plan with the patient/family. Imaging, labs and tests included in this note have been reviewed and interpreted  independently by me.  Sally Salmi Mechele Collin, MD [facility] 11/29/2022 3:22 PM  Office Number [phone]

## 2022-11-29 NOTE — Patient Instructions (Addendum)
Chronic hypoxemic respiratory failure - suspect this is acute and slowly improve with time --CONTINUE oxygen for now for goal SpO2 >88% --CONTINUE oxygen with activity and sleep --Encourage more frequent walking at home with walker  Asthma --CONTINUE Symbicort 160-4.5 mcg TWO puffs in the morning and evening. Rinse mouth out after use --CONTINUE Albuterol AS NEEDED for shortness of breath or wheezing.   Abnormal CT -likely related to recent pneumonia. Discussed that this could be atypical infection such as MAC however not a good candidate for diagnostic testing or treatment. No stigmata of ILD Minimal bronchiectasis - may be related to recurrent infections +/- aspiration. No aspiration symptoms reported. Hold on swallow evaluation --Reviewed CT Chest overall stable. No indication for bronchoscopy at this time

## 2022-12-02 ENCOUNTER — Encounter (HOSPITAL_BASED_OUTPATIENT_CLINIC_OR_DEPARTMENT_OTHER): Payer: Self-pay | Admitting: Pulmonary Disease

## 2022-12-06 DIAGNOSIS — M316 Other giant cell arteritis: Secondary | ICD-10-CM | POA: Diagnosis not present

## 2022-12-13 ENCOUNTER — Other Ambulatory Visit: Payer: Self-pay | Admitting: Family Medicine

## 2022-12-26 NOTE — Progress Notes (Signed)
Subjective:     Patient ID: [name], female    DOB: 02-May-1929, 89 y.o.   MRN: [id number]  Chief Complaint  Patient presents with   Medical Management of Chronic Issues    Follow-up on htn Doesn't think Wellbutrin is working    HPI - She is accompanied by her daughter, [name]. Using wheelchair. Wearing sunglasses, states the bright lights tend to bother her.   HTN - Pt is on amlodipine 2.5 mg and olmesartan 40 mg daily. No ha/dizziness/cp/palp/edema/cough/sob.  Memory - Her daughter states she continues to struggle with her memory. Remembers her children some days and some days she doesn't. At times she will state she is homesick and "wants to go home". Her daughter reports she sometimes packs a bag to visit her daughter and later at night will change her mind. Her daughter and son have been spending time with her by the bed at night to help her go to sleep. Daughter reports Pt often asks where she is and how she got there, asking why someone "dropped her off and left".  Depression - Taking 25 mg zoloft possibly 50mg , and wellbutrin 150mg . Pt cries on a daily basis, with no specific reason. States she "can't help it" and often feels homesick, though she is at home.   Anxiety - Has been taking Valium 5 mg BID to manage anxiety with associated restlessness in arms and legs. Her daughter has been giving her half tablets at 2.5 mg when she complains of nerve pain. Daughter is working on finding a placebo to avoid giving her valium often.   Low O2 levels - She has been doing well on O2, SpO2 stays in 90s. Her daughter reports when she gets hot she complains of feeling "smothered" and makes her anxious. Has continued to follow up with pulmonology.   Diet - Eating overall well, healthy meals. Enjoys eating cabbage, which has caused her increased gas. Has developed a taste for sweets, which her daughter states is new.   Has a wound on her right arm from running into the door handel . Her  daughter reports that she applies Liquid Bandage to help.    There are no preventive care reminders to display for this patient.   Past Medical History:  Diagnosis Date   Abdominal bloating    Abnormal LFTs (liver function tests)    Ankle fracture, bimalleolar, closed, right, initial encounter    Anxiety disorder    Arthritis    Asthma    Bimalleolar ankle fracture, right, closed, initial encounter 01/06/2019   Depression    Diverticulosis    Edema    Fatty liver    Fibromyalgia    GERD (gastroesophageal reflux disease)    Giant cell arteritis (HCC)    No biopsy secondary to steroid use   Hx of adenomatous colonic polyps    Hyperlipidemia    Hypertension    IBS (irritable bowel syndrome)    Lumbar radiculitis    Osteoporosis 09/2017   T score -2.6 left femoral neck   Peptic ulcer disease    Skin cancer    shoulder, leg, right eye (? squamous by resection)   Thoracic radiculitis    Thyroid nodule    "removed"   Urinary incontinence    Vertigo     Past Surgical History:  Procedure Laterality Date   ABDOMINAL HYSTERECTOMY     BSO   BACK SURGERY  2005   lower back   EYE SURGERY Right 2006  LIVER BIOPSY     fatty liver   NASAL SEPTUM SURGERY     ORIF ANKLE FRACTURE Right 01/11/2019   Procedure: OPEN REDUCTION INTERNAL FIXATION (ORIF) RIGHT BIMALLEOLAR ANKLE FRACTURE;  Surgeon: Tarry Kos, MD;  Location: [facility];  Service: Orthopedics;  Laterality: Right;   right knee surgery  2006   arthroscopy   right leg skin cancer surgery  2014   right shoulder skin cancer excision  2012   SKIN CANCER EXCISION     THYROIDECTOMY       Current Outpatient Medications:    albuterol (VENTOLIN HFA) 108 (90 Base) MCG/ACT inhaler, USE 2 INHALATIONS UP TO EVERY 4 HOURS IF CANNOT CATCH YOUR BREATH, Disp: 18 g, Rfl: 1   amLODipine (NORVASC) 2.5 MG tablet, TAKE 1 TABLET DAILY, Disp: 90 tablet, Rfl: 3   aspirin EC 81 MG tablet, Take by mouth., Disp: , Rfl:     budesonide-formoterol (SYMBICORT) 160-4.5 MCG/ACT inhaler, USE 2 INHALATIONS TWICE A DAY, Disp: 30.6 g, Rfl: 3   calcium-vitamin D (OSCAL WITH D) 500-200 MG-UNIT tablet, Take 1 tablet by mouth 3 (three) times daily., Disp: 90 tablet, Rfl: 6   cephALEXin (KEFLEX) 250 MG capsule, Take 250 mg by mouth at bedtime., Disp: , Rfl:    Cholecalciferol (VITAMIN D3) 20 MCG (800 UNIT) TABS, Take 1 tablet by mouth daily., Disp: 90 tablet, Rfl: 3   cyanocobalamin (VITAMIN B12) 1000 MCG/ML injection, INJECT 1000 MCG WEEKLY FOR 4 WEEKS THEN MONTHLY THEREAFTER, Disp: 10 mL, Rfl: 3   docusate sodium (COLACE) 100 MG capsule, Take 100 mg by mouth 2 (two) times daily., Disp: , Rfl:    estradiol (ESTRACE) 0.1 MG/GM vaginal cream, Place vaginally., Disp: , Rfl:    memantine (NAMENDA) 5 MG tablet, TAKE 1 TABLET TWICE A DAY, Disp: 180 tablet, Rfl: 3   Methen-Hyosc-Meth Blue-Na Phos (ME/NAPHOS/MB/HYO1) 81.6 MG TABS, SMARTSIG:Tablet(s) By Mouth 2-3 Times Daily PRN, Disp: , Rfl:    neomycin-colistin-hydrocortisone-thonzonium (CORTISPORIN-TC) 3.05-18-08-0.5 MG/ML OTIC suspension, Place 3 drops into the right ear 4 (four) times daily., Disp: 10 mL, Rfl: 0   olmesartan (BENICAR) 40 MG tablet, TAKE 1 TABLET DAILY, Disp: 90 tablet, Rfl: 3   omega-3 acid ethyl esters (LOVAZA) 1 g capsule, TAKE 2 CAPSULES TWICE A DAY, Disp: 360 capsule, Rfl: 3   ondansetron (ZOFRAN-ODT) 8 MG disintegrating tablet, PLACE 1 TABLET ON TONGUE EVERY 8 HOURS AS NEEDED FOR NAUSEA OR VOMITING, Disp: 30 tablet, Rfl: 35   polyethylene glycol (MIRALAX / GLYCOLAX) 17 g packet, Take 17 g by mouth daily., Disp: , Rfl:    predniSONE (DELTASONE) 5 MG tablet, Take 10 mg by mouth daily., Disp: , Rfl:    Probiotic Product (PROBIOTIC PO), Take by mouth daily., Disp: , Rfl:    RESTASIS 0.05 % ophthalmic emulsion, 1 drop 2 (two) times daily., Disp: , Rfl:    rOPINIRole (REQUIP) 1 MG tablet, TAKE 1 TABLET IN THE MORNING AND 2 TABLETS IN THE EVENING, Disp: 270 tablet, Rfl:  3   sertraline (ZOLOFT) 25 MG tablet, Take 25 mg by mouth daily., Disp: , Rfl:    SYRINGE-NEEDLE, DISP, 3 ML (B-D 3CC LUER-LOK SYR 25GX1") 25G X 1" 3 ML MISC, Use weekly x 4 weeks then monthly with B 12, Disp: 50 each, Rfl: 0   tocilizumab (ACTEMRA) 400 MG/20ML SOLN injection, , Disp: , Rfl:    tocilizumab 4 mg/kg in sodium chloride 0.9 %, Inject 280 mg into the vein., Disp: , Rfl:  traMADol (ULTRAM) 50 MG tablet, TAKE ONE TABLET BY MOUTH EVERY 6 HOURS AS NEEDED FOR MODERATE PAIN, Disp: 40 tablet, Rfl: 0   buPROPion (WELLBUTRIN XL) 150 MG 24 hr tablet, Take 1 tablet (150 mg total) by mouth daily., Disp: 90 tablet, Rfl: 1   diazepam (VALIUM) 5 MG tablet, Take 1 tablet (5 mg total) by mouth every 12 (twelve) hours as needed for anxiety., Disp: 100 tablet, Rfl: 1  Allergies  Allergen Reactions   Atorvastatin Other (See Comments)    Severe headache   Oxycodone Itching    hallucinations   Statins    Advair Diskus [Fluticasone-Salmeterol] Other (See Comments)    Mouth sores   Sulfonamide Derivatives Rash   ROS neg/noncontributory except as noted HPI/below      Objective:     BP (!) 144/62 (BP Location: Right Arm, Patient Position: Sitting, Cuff Size: Large)   Pulse 83   Temp 99.1 F (37.3 C) (Temporal)   Resp 18   Ht 5\' 4"  (1.626 m)   Wt 170 lb (77.1 kg)   SpO2 95%   BMI 29.18 kg/m  Wt Readings from Last 3 Encounters:  12/27/22 170 lb (77.1 kg)  11/29/22 179 lb (81.2 kg)  09/27/22 179 lb 8 oz (81.4 kg)    Physical Exam   Gen: WDWN NAD HEENT: NCAT, conjunctiva not injected, sclera nonicteric NECK:  supple, no thyromegaly, no nodes, no carotid bruits CARDIAC: RRR, S1S2+, no murmur. DP 2+B LUNGS: CTAB. No wheezes ABDOMEN:  BS+, soft, NTND, No HSM, no masses. EXT:  no edema MSK: In wheelchair. NEURO: A&O x1.  CN II-XII intact.  PSYCH: normal mood. Good eye contact  PDMP reviewed today, no red flags.  Getting labs thru rheum frequently    Assessment & Plan:   HYPERTENSION, BENIGN  Depression with anxiety  Age-related cognitive decline  Polymyalgia rheumatica (HCC)  Need for influenza vaccination -     Flu Vaccine Trivalent High Dose (Fluad)  Other orders -     diazePAM; Take 1 tablet (5 mg total) by mouth every 12 (twelve) hours as needed for anxiety.  Dispense: 100 tablet; Refill: 1 -     buPROPion HCl ER (XL); Take 1 tablet (150 mg total) by mouth daily.  Dispense: 90 tablet; Refill: 1    Return in about 6 months (around 06/27/2023) for chronic follow-up.    I,Rachel Rivera,acting as a scribe for Angelena Sole, MD.,have documented all relevant documentation on the behalf of Angelena Sole, MD,as directed by  Angelena Sole, MD while in the presence of Angelena Sole, MD.  I, Angelena Sole, MD, have reviewed all documentation for this visit. The documentation on 12/28/22 for the exam, diagnosis, procedures, and orders are all accurate and complete.    Angelena Sole, MD

## 2022-12-27 ENCOUNTER — Encounter: Payer: Self-pay | Admitting: Family Medicine

## 2022-12-27 ENCOUNTER — Ambulatory Visit (INDEPENDENT_AMBULATORY_CARE_PROVIDER_SITE_OTHER): Payer: Medicare Other | Admitting: Family Medicine

## 2022-12-27 VITALS — BP 144/62 | HR 83 | Temp 99.1°F | Resp 18 | Ht 64.0 in | Wt 170.0 lb

## 2022-12-27 DIAGNOSIS — F418 Other specified anxiety disorders: Secondary | ICD-10-CM | POA: Diagnosis not present

## 2022-12-27 DIAGNOSIS — Z23 Encounter for immunization: Secondary | ICD-10-CM

## 2022-12-27 DIAGNOSIS — R4181 Age-related cognitive decline: Secondary | ICD-10-CM | POA: Diagnosis not present

## 2022-12-27 DIAGNOSIS — M353 Polymyalgia rheumatica: Secondary | ICD-10-CM | POA: Diagnosis not present

## 2022-12-27 DIAGNOSIS — I10 Essential (primary) hypertension: Secondary | ICD-10-CM

## 2022-12-27 MED ORDER — DIAZEPAM 5 MG PO TABS: 5.0000 mg | ORAL_TABLET | Freq: Two times a day (BID) | ORAL | 1 refills | Status: DC | PRN

## 2022-12-27 MED ORDER — BUPROPION HCL ER (XL) 150 MG PO TB24: 150.0000 mg | ORAL_TABLET | Freq: Every day | ORAL | 1 refills | Status: DC

## 2022-12-27 NOTE — Patient Instructions (Signed)
It was very nice to see you today!  Check on dose of sertraline at home   PLEASE NOTE:  If you had any lab tests please let us know if you have not heard back within a few days. You may see your results on MyChart before we have a chance to review them but we will give you a call once they are reviewed by [place]. If we ordered any referrals today, please let us know if you have not heard from their office within the next week.   Please try these tips to maintain a healthy lifestyle:  Eat most of your calories during the day when you are active. Eliminate processed foods including packaged sweets (pies, cakes, cookies), reduce intake of potatoes, white bread, white pasta, and white rice. Look for whole grain options, oat flour or almond flour.  Each meal should contain half fruits/vegetables, one quarter protein, and one quarter carbs (no bigger than a computer mouse).  Cut down on sweet beverages. This includes juice, soda, and sweet tea. Also watch fruit intake, though this is a healthier sweet option, it still contains natural sugar! Limit to 3 servings daily.  Drink at least 1 glass of water with each meal and aim for at least 8 glasses per day  Exercise at least 150 minutes every week.

## 2022-12-28 NOTE — Assessment & Plan Note (Signed)
Chronic.  Worsening.  Check dose of zoloft at home-should be 50mg , continue wellbutrin xl 150mg (daughter not want to increase)  Anxiety-not sure if valium really helping or mental.  Will try to sub B6 vitamin and see if pacify.  If not, then do the valium-pt high risk falls, etc.  Family aware

## 2022-12-28 NOTE — Assessment & Plan Note (Signed)
Chronic.  Stable.  Managed by rheum.  On Actemera and prednisone

## 2022-12-28 NOTE — Assessment & Plan Note (Signed)
Chronic.  Progressive.  Intolerant of several meds.  Will continue namenda 5mg  at hs. Discussed course w/daughter, what to expect, not changing environment, etc.

## 2022-12-28 NOTE — Assessment & Plan Note (Signed)
Chronic.  Controlled.  Continue amlodipine 2.5mg  and olmesartan 40mg  daily

## 2022-12-30 NOTE — Telephone Encounter (Signed)
Chart has been updated.

## 2023-01-03 DIAGNOSIS — M316 Other giant cell arteritis: Secondary | ICD-10-CM | POA: Diagnosis not present

## 2023-01-03 DIAGNOSIS — Z7960 Long term (current) use of unspecified immunomodulators and immunosuppressants: Secondary | ICD-10-CM | POA: Diagnosis not present

## 2023-01-03 DIAGNOSIS — M810 Age-related osteoporosis without current pathological fracture: Secondary | ICD-10-CM | POA: Diagnosis not present

## 2023-01-03 DIAGNOSIS — M2550 Pain in unspecified joint: Secondary | ICD-10-CM | POA: Diagnosis not present

## 2023-01-24 DIAGNOSIS — R300 Dysuria: Secondary | ICD-10-CM | POA: Diagnosis not present

## 2023-01-30 DIAGNOSIS — N3020 Other chronic cystitis without hematuria: Secondary | ICD-10-CM | POA: Diagnosis not present

## 2023-01-30 DIAGNOSIS — N952 Postmenopausal atrophic vaginitis: Secondary | ICD-10-CM | POA: Diagnosis not present

## 2023-01-30 DIAGNOSIS — R300 Dysuria: Secondary | ICD-10-CM | POA: Diagnosis not present

## 2023-01-31 DIAGNOSIS — M316 Other giant cell arteritis: Secondary | ICD-10-CM | POA: Diagnosis not present

## 2023-02-18 ENCOUNTER — Encounter: Payer: Self-pay | Admitting: Family Medicine

## 2023-02-18 ENCOUNTER — Other Ambulatory Visit: Payer: Self-pay | Admitting: Family Medicine

## 2023-02-18 ENCOUNTER — Ambulatory Visit: Payer: Medicare Other | Admitting: Family Medicine

## 2023-02-18 VITALS — BP 132/82 | HR 87 | Temp 98.1°F | Resp 16 | Ht 64.0 in | Wt 166.5 lb

## 2023-02-18 DIAGNOSIS — J40 Bronchitis, not specified as acute or chronic: Secondary | ICD-10-CM

## 2023-02-18 DIAGNOSIS — R051 Acute cough: Secondary | ICD-10-CM | POA: Diagnosis not present

## 2023-02-18 DIAGNOSIS — D849 Immunodeficiency, unspecified: Secondary | ICD-10-CM | POA: Diagnosis not present

## 2023-02-18 DIAGNOSIS — J4540 Moderate persistent asthma, uncomplicated: Secondary | ICD-10-CM | POA: Diagnosis not present

## 2023-02-18 MED ORDER — NEBULIZER/TUBING/MOUTHPIECE KIT: 1.0000 | PACK | Freq: Once | 1 refills | Status: AC

## 2023-02-18 MED ORDER — ALBUTEROL SULFATE 2.5 MG/0.5ML IN NEBU: 2.5000 mg | INHALATION_SOLUTION | RESPIRATORY_TRACT | 3 refills | Status: DC | PRN

## 2023-02-18 MED ORDER — PREDNISONE 20 MG PO TABS: 40.0000 mg | ORAL_TABLET | Freq: Every day | ORAL | 0 refills | Status: AC

## 2023-02-18 MED ORDER — ALBUTEROL SULFATE (2.5 MG/3ML) 0.083% IN NEBU: 2.5000 mg | INHALATION_SOLUTION | Freq: Four times a day (QID) | RESPIRATORY_TRACT | 1 refills | Status: DC | PRN

## 2023-02-18 MED ORDER — AZITHROMYCIN 250 MG PO TABS: ORAL_TABLET | ORAL | 0 refills | Status: AC

## 2023-02-18 NOTE — Patient Instructions (Signed)
Nebulizer every 4-6 hrs as needed Prednisone and zpack sent.  Also nebulizer soln.  Worse, etc to ER.   Wear oxygen

## 2023-02-18 NOTE — Telephone Encounter (Signed)
Duplicate message. 

## 2023-02-18 NOTE — Progress Notes (Signed)
Subjective:     Patient ID: [name], female    DOB: [birth year], 89 y.o.   MRN: [id number]  Chief Complaint  Patient presents with   Cough    Productive cough that started last week Wednesday, chest congestion    HPI here w/daughter Cough-pt w/h/o asthma and giant cell arteritis on chronic pred and actemera.  Cough for 1 wk, getting worse.  Staying in the bed past 2 days.  Productive-beige. More sob than usual.  Can't talk w/o sob. No fever.  No runny nose   wearing O2 24 hrs now  using regular inhalers.  No edema  There are no preventive care reminders to display for this patient.  Past Medical History:  Diagnosis Date   Abdominal bloating    Abnormal LFTs (liver function tests)    Ankle fracture, bimalleolar, closed, right, initial encounter    Anxiety disorder    Arthritis    Asthma    Bimalleolar ankle fracture, right, closed, initial encounter 01/06/2019   Depression    Diverticulosis    Edema    Fatty liver    Fibromyalgia    GERD (gastroesophageal reflux disease)    Giant cell arteritis (HCC)    No biopsy secondary to steroid use   Hx of adenomatous colonic polyps    Hyperlipidemia    Hypertension    IBS (irritable bowel syndrome)    Lumbar radiculitis    Osteoporosis 09/2017   T score -2.6 left femoral neck   Peptic ulcer disease    Skin cancer    shoulder, leg, right eye (? squamous by resection)   Thoracic radiculitis    Thyroid nodule    "removed"   Urinary incontinence    Vertigo     Past Surgical History:  Procedure Laterality Date   ABDOMINAL HYSTERECTOMY     BSO   BACK SURGERY  2005   lower back   EYE SURGERY Right 2006   LIVER BIOPSY     fatty liver   NASAL SEPTUM SURGERY     ORIF ANKLE FRACTURE Right 01/11/2019   Procedure: OPEN REDUCTION INTERNAL FIXATION (ORIF) RIGHT BIMALLEOLAR ANKLE FRACTURE;  Surgeon: Tarry Kos, MD;  Location: [facility];  Service: Orthopedics;  Laterality: Right;   right knee surgery  2006    arthroscopy   right leg skin cancer surgery  2014   right shoulder skin cancer excision  2012   SKIN CANCER EXCISION     THYROIDECTOMY       Current Outpatient Medications:    albuterol (VENTOLIN HFA) 108 (90 Base) MCG/ACT inhaler, USE 2 INHALATIONS UP TO EVERY 4 HOURS IF CANNOT CATCH YOUR BREATH, Disp: 18 g, Rfl: 1   Albuterol Sulfate 2.5 MG/0.5ML NEBU, Inhale 0.5 mLs (2.5 mg total) into the lungs every 4 (four) hours as needed., Disp: 20 mL, Rfl: 3   amLODipine (NORVASC) 2.5 MG tablet, TAKE 1 TABLET DAILY, Disp: 90 tablet, Rfl: 3   aspirin EC 81 MG tablet, Take by mouth., Disp: , Rfl:    azithromycin (ZITHROMAX) 250 MG tablet, Take 2 tablets on day 1, then 1 tablet daily on days 2 through 5, Disp: 6 tablet, Rfl: 0   budesonide-formoterol (SYMBICORT) 160-4.5 MCG/ACT inhaler, USE 2 INHALATIONS TWICE A DAY, Disp: 30.6 g, Rfl: 3   buPROPion (WELLBUTRIN XL) 150 MG 24 hr tablet, Take 1 tablet (150 mg total) by mouth daily., Disp: 90 tablet, Rfl: 1   calcium-vitamin D (OSCAL WITH D) 500-200 MG-UNIT  tablet, Take 1 tablet by mouth 3 (three) times daily., Disp: 90 tablet, Rfl: 6   cephALEXin (KEFLEX) 250 MG capsule, Take 250 mg by mouth at bedtime., Disp: , Rfl:    Cholecalciferol (VITAMIN D3) 20 MCG (800 UNIT) TABS, Take 1 tablet by mouth daily., Disp: 90 tablet, Rfl: 3   cyanocobalamin (VITAMIN B12) 1000 MCG/ML injection, INJECT 1000 MCG WEEKLY FOR 4 WEEKS THEN MONTHLY THEREAFTER, Disp: 10 mL, Rfl: 3   diazepam (VALIUM) 5 MG tablet, Take 1 tablet (5 mg total) by mouth every 12 (twelve) hours as needed for anxiety., Disp: 100 tablet, Rfl: 1   docusate sodium (COLACE) 100 MG capsule, Take 100 mg by mouth 2 (two) times daily., Disp: , Rfl:    estradiol (ESTRACE) 0.1 MG/GM vaginal cream, Place vaginally., Disp: , Rfl:    memantine (NAMENDA) 5 MG tablet, TAKE 1 TABLET TWICE A DAY, Disp: 180 tablet, Rfl: 3   Methen-Hyosc-Meth Blue-Na Phos (ME/NAPHOS/MB/HYO1) 81.6 MG TABS, SMARTSIG:Tablet(s) By Mouth  2-3 Times Daily PRN, Disp: , Rfl:    neomycin-colistin-hydrocortisone-thonzonium (CORTISPORIN-TC) 3.05-18-08-0.5 MG/ML OTIC suspension, Place 3 drops into the right ear 4 (four) times daily., Disp: 10 mL, Rfl: 0   olmesartan (BENICAR) 40 MG tablet, TAKE 1 TABLET DAILY, Disp: 90 tablet, Rfl: 3   omega-3 acid ethyl esters (LOVAZA) 1 g capsule, TAKE 2 CAPSULES TWICE A DAY, Disp: 360 capsule, Rfl: 3   ondansetron (ZOFRAN-ODT) 8 MG disintegrating tablet, PLACE 1 TABLET ON TONGUE EVERY 8 HOURS AS NEEDED FOR NAUSEA OR VOMITING, Disp: 30 tablet, Rfl: 35   polyethylene glycol (MIRALAX / GLYCOLAX) 17 g packet, Take 17 g by mouth daily., Disp: , Rfl:    predniSONE (DELTASONE) 20 MG tablet, Take 2 tablets (40 mg total) by mouth daily with breakfast for 5 days., Disp: 10 tablet, Rfl: 0   predniSONE (DELTASONE) 5 MG tablet, Take 10 mg by mouth daily., Disp: , Rfl:    Probiotic Product (PROBIOTIC PO), Take by mouth daily., Disp: , Rfl:    Respiratory Therapy Supplies (NEBULIZER/TUBING/MOUTHPIECE) KIT, 1 each by Does not apply route once for 1 dose., Disp: 10 kit, Rfl: 1   RESTASIS 0.05 % ophthalmic emulsion, 1 drop 2 (two) times daily., Disp: , Rfl:    rOPINIRole (REQUIP) 1 MG tablet, TAKE 1 TABLET IN THE MORNING AND 2 TABLETS IN THE EVENING, Disp: 270 tablet, Rfl: 3   sertraline (ZOLOFT) 50 MG tablet, Take 50 mg by mouth daily., Disp: , Rfl:    SYRINGE-NEEDLE, DISP, 3 ML (B-D 3CC LUER-LOK SYR 25GX1") 25G X 1" 3 ML MISC, Use weekly x 4 weeks then monthly with B 12, Disp: 50 each, Rfl: 0   tocilizumab (ACTEMRA) 400 MG/20ML SOLN injection, , Disp: , Rfl:    tocilizumab 4 mg/kg in sodium chloride 0.9 %, Inject 280 mg into the vein., Disp: , Rfl:    traMADol (ULTRAM) 50 MG tablet, TAKE ONE TABLET BY MOUTH EVERY 6 HOURS AS NEEDED FOR MODERATE PAIN, Disp: 40 tablet, Rfl: 0  Allergies  Allergen Reactions   Atorvastatin Other (See Comments)    Severe headache   Oxycodone Itching    hallucinations   Statins    Advair  Diskus [Fluticasone-Salmeterol] Other (See Comments)    Mouth sores   Sulfonamide Derivatives Rash   ROS neg/noncontributory except as noted HPI/below      Objective:     BP 132/82   Pulse 87   Temp 98.1 F (36.7 C) (Temporal)   Resp 16  Ht 5\' 4"  (1.626 m)   Wt 166 lb 8 oz (75.5 kg)   SpO2 (!) 88%   BMI 28.58 kg/m  Wt Readings from Last 3 Encounters:  02/18/23 166 lb 8 oz (75.5 kg)  12/27/22 170 lb (77.1 kg)  11/29/22 179 lb (81.2 kg)    Physical Exam   Gen: WDWN NAD.  Pt forgot O2 at home HEENT: NCAT, conjunctiva not injected, sclera nonicteric TM WNL B, OP moist, no exudates  NECK:  supple, no thyromegaly, no nodes, no carotid bruits CARDIAC: RRR, S1S2+, no murmur. DP 2+B LUNGS: + exp wheezing.  Good ae.  EXT:  no edema MSK: no gross abnormalities.  NEURO: A&O x1.  CN II-XII intact.  PSYCH: normal mood. Good eye contact     Assessment & Plan:  Acute cough  Moderate persistent asthma without complication -     Nebulizer/Tubing/Mouthpiece; 1 each by Does not apply route once for 1 dose.  Dispense: 10 kit; Refill: 1  Bronchitis -     Nebulizer/Tubing/Mouthpiece; 1 each by Does not apply route once for 1 dose.  Dispense: 10 kit; Refill: 1  Immunocompromised (HCC)  Other orders -     Azithromycin; Take 2 tablets on day 1, then 1 tablet daily on days 2 through 5  Dispense: 6 tablet; Refill: 0 -     predniSONE; Take 2 tablets (40 mg total) by mouth daily with breakfast for 5 days.  Dispense: 10 tablet; Refill: 0 -     Albuterol Sulfate; Inhale 0.5 mLs (2.5 mg total) into the lungs every 4 (four) hours as needed.  Dispense: 20 mL; Refill: 3  1  cough/asthma exacerbation-continue Symbicort.  Will do antibiotics Z-Pak.  Burst of steroids prednisone 20 mg daily for 5 days.  Albuterol nebulizer every 4-6 hours as needed or she can use her albuterol MDI.  Get back on oxygen.  She is immunocompromised as she is on Actemra and chronic prednisone.  Definitely needs to be  monitored closely.  If things are worsening, go to the emergency room.  Return if symptoms worsen or fail to improve.  Angelena Sole, MD

## 2023-03-04 DIAGNOSIS — M316 Other giant cell arteritis: Secondary | ICD-10-CM | POA: Diagnosis not present

## 2023-04-02 DIAGNOSIS — M316 Other giant cell arteritis: Secondary | ICD-10-CM | POA: Diagnosis not present

## 2023-05-02 DIAGNOSIS — M316 Other giant cell arteritis: Secondary | ICD-10-CM | POA: Diagnosis not present

## 2023-05-30 DIAGNOSIS — M316 Other giant cell arteritis: Secondary | ICD-10-CM | POA: Diagnosis not present

## 2023-06-05 ENCOUNTER — Ambulatory Visit (HOSPITAL_BASED_OUTPATIENT_CLINIC_OR_DEPARTMENT_OTHER): Payer: Medicare Other | Admitting: Pulmonary Disease

## 2023-06-05 ENCOUNTER — Encounter (HOSPITAL_BASED_OUTPATIENT_CLINIC_OR_DEPARTMENT_OTHER): Payer: Self-pay | Admitting: Pulmonary Disease

## 2023-06-05 VITALS — BP 122/76 | HR 71 | Ht 64.0 in | Wt 171.0 lb

## 2023-06-05 DIAGNOSIS — J4541 Moderate persistent asthma with (acute) exacerbation: Secondary | ICD-10-CM

## 2023-06-05 DIAGNOSIS — J9611 Chronic respiratory failure with hypoxia: Secondary | ICD-10-CM | POA: Diagnosis not present

## 2023-06-05 DIAGNOSIS — J45901 Unspecified asthma with (acute) exacerbation: Secondary | ICD-10-CM | POA: Diagnosis not present

## 2023-06-05 MED ORDER — PREDNISONE 10 MG PO TABS: ORAL_TABLET | ORAL | 0 refills | Status: AC

## 2023-06-05 NOTE — Progress Notes (Addendum)
 Subjective:   PATIENT ID: [name] GENDER: female DOB: 07-25-1929, MRN: [id number]  Chief Complaint  Patient presents with   Follow-up    Abnormal CT, asthma    Reason for Visit: Follow-up  [name] is a 89 year old female never smoker with asthma, polymyagia rheumatica on Actemra, HTN, HLD, osteoporosis, GERD, fibromyalgia, anxiety/depression who presents for follow-up.  Initial consult She has had shortness of breath that worsens with activity for >1 year. Worsens with laying down. She had presented to the ED on 06/24/22 and CTA was concerning for possible pneumonia. She was hypoxemia and started on home O2. Denies chronic cough but occasional hacky cough thought related to sinus drainage. She was seen by her PCP for ED follow-up. Continued to need O2 so was referred to Pulmonary for further work-up. At home she walks with a walker. Sedentary at baseline. She snores at night. Denies witnessed apnea or gasping. She uses Symbicort and albuterol once a day, feels asthma is under control   11/29/22 Since our last visit she continues have shortness of breath but improving some. Denies cough or wheezing. Remains sedentary at baseline. Compliant with oxygen, Symbicort and PRN albuterol. Uses rescue inhaler infrequently. Presents with daughter to review recent CT scan.  06/05/23 Since our last visit she reports shortness of breath with activity. Wears oxygen with activity and sleep. Denies cough or wheezing. Sedentary at baseline. Compliant with Symbicort. This week developed thick productive cough and wheezing triggered by allergies. Using nebulizer twice a day without improvement. Has not tried over the counter. On claritin. Has post nasal gtt, using saline rinses. Denies fevers, chills.  Social History: Retired Conservation officer, historic buildings exposures: methotrexate in the past   Past Medical History:  Diagnosis Date   Abdominal bloating    Abnormal LFTs (liver  function tests)    Ankle fracture, bimalleolar, closed, right, initial encounter    Anxiety disorder    Arthritis    Asthma    Bimalleolar ankle fracture, right, closed, initial encounter 01/06/2019   Depression    Diverticulosis    Edema    Fatty liver    Fibromyalgia    GERD (gastroesophageal reflux disease)    Giant cell arteritis (HCC)    No biopsy secondary to steroid use   Hx of adenomatous colonic polyps    Hyperlipidemia    Hypertension    IBS (irritable bowel syndrome)    Lumbar radiculitis    Osteoporosis 09/2017   T score -2.6 left femoral neck   Peptic ulcer disease    Skin cancer    shoulder, leg, right eye (? squamous by resection)   Thoracic radiculitis    Thyroid nodule    "removed"   Urinary incontinence    Vertigo      Family History  Problem Relation Age of Onset   Colon polyps Maternal Aunt    Diabetes Father    Heart disease Father    Heart disease Mother    Lung disease Mother    COPD Mother    Lung cancer Brother    Cancer Brother        lung   COPD Brother    Stroke Brother    Lung cancer Sister    COPD Sister    Kidney disease Other        niece-mat side   Arthritis Other    Diabetes Son    Arthritis Daughter    Colon cancer Neg Hx  Social History   Occupational History   Occupation: Retired Chief Financial Officer  Tobacco Use   Smoking status: Never   Smokeless tobacco: Never  Vaping Use   Vaping status: Never Used  Substance and Sexual Activity   Alcohol use: No   Drug use: No   Sexual activity: Not Currently    Comment: intercourser age 17, less than 5 secxual partners    Allergies  Allergen Reactions   Atorvastatin Other (See Comments)    Severe headache   Oxycodone Itching    hallucinations   Statins    Advair Diskus [Fluticasone-Salmeterol] Other (See Comments)    Mouth sores   Sulfonamide Derivatives Rash     Outpatient Medications Prior to Visit  Medication Sig Dispense Refill   albuterol  (PROVENTIL) (2.5 MG/3ML) 0.083% nebulizer solution Take 3 mLs (2.5 mg total) by nebulization every 6 (six) hours as needed for wheezing or shortness of breath. 150 mL 1   albuterol (VENTOLIN HFA) 108 (90 Base) MCG/ACT inhaler USE 2 INHALATIONS UP TO EVERY 4 HOURS IF CANNOT CATCH YOUR BREATH 18 g 1   amLODipine (NORVASC) 2.5 MG tablet TAKE 1 TABLET DAILY 90 tablet 3   aspirin EC 81 MG tablet Take by mouth.     budesonide-formoterol (SYMBICORT) 160-4.5 MCG/ACT inhaler USE 2 INHALATIONS TWICE A DAY 30.6 g 3   buPROPion (WELLBUTRIN XL) 150 MG 24 hr tablet Take 1 tablet (150 mg total) by mouth daily. 90 tablet 1   calcium-vitamin D (OSCAL WITH D) 500-200 MG-UNIT tablet Take 1 tablet by mouth 3 (three) times daily. 90 tablet 6   cephALEXin (KEFLEX) 250 MG capsule Take 250 mg by mouth at bedtime.     Cholecalciferol (VITAMIN D3) 20 MCG (800 UNIT) TABS Take 1 tablet by mouth daily. 90 tablet 3   cyanocobalamin (VITAMIN B12) 1000 MCG/ML injection INJECT 1000 MCG WEEKLY FOR 4 WEEKS THEN MONTHLY THEREAFTER 10 mL 3   diazepam (VALIUM) 5 MG tablet Take 1 tablet (5 mg total) by mouth every 12 (twelve) hours as needed for anxiety. 100 tablet 1   docusate sodium (COLACE) 100 MG capsule Take 100 mg by mouth 2 (two) times daily.     estradiol (ESTRACE) 0.1 MG/GM vaginal cream Place vaginally.     memantine (NAMENDA) 5 MG tablet TAKE 1 TABLET TWICE A DAY 180 tablet 3   neomycin-colistin-hydrocortisone-thonzonium (CORTISPORIN-TC) 3.05-18-08-0.5 MG/ML OTIC suspension Place 3 drops into the right ear 4 (four) times daily. 10 mL 0   olmesartan (BENICAR) 40 MG tablet TAKE 1 TABLET DAILY 90 tablet 3   omega-3 acid ethyl esters (LOVAZA) 1 g capsule TAKE 2 CAPSULES TWICE A DAY 360 capsule 3   ondansetron (ZOFRAN-ODT) 8 MG disintegrating tablet PLACE 1 TABLET ON TONGUE EVERY 8 HOURS AS NEEDED FOR NAUSEA OR VOMITING 30 tablet 35   polyethylene glycol (MIRALAX / GLYCOLAX) 17 g packet Take 17 g by mouth daily.     predniSONE  (DELTASONE) 5 MG tablet Take 10 mg by mouth daily.     Probiotic Product (PROBIOTIC PO) Take by mouth daily.     RESTASIS 0.05 % ophthalmic emulsion 1 drop 2 (two) times daily.     rOPINIRole (REQUIP) 1 MG tablet TAKE 1 TABLET IN THE MORNING AND 2 TABLETS IN THE EVENING 270 tablet 3   sertraline (ZOLOFT) 50 MG tablet Take 50 mg by mouth daily.     SYRINGE-NEEDLE, DISP, 3 ML (B-D 3CC LUER-LOK SYR 25GX1") 25G X 1" 3 ML MISC Use  weekly x 4 weeks then monthly with B 12 50 each 0   tocilizumab (ACTEMRA) 400 MG/20ML SOLN injection      tocilizumab 4 mg/kg in sodium chloride 0.9 % Inject 280 mg into the vein.     traMADol (ULTRAM) 50 MG tablet TAKE ONE TABLET BY MOUTH EVERY 6 HOURS AS NEEDED FOR MODERATE PAIN 40 tablet 0   Methen-Hyosc-Meth Blue-Na Phos (ME/NAPHOS/MB/HYO1) 81.6 MG TABS SMARTSIG:Tablet(s) By Mouth 2-3 Times Daily PRN (Patient not taking: Reported on 06/05/2023)     No facility-administered medications prior to visit.    Review of Systems  Constitutional:  Negative for chills, diaphoresis, fever, malaise/fatigue and weight loss.  HENT:  Negative for congestion.   Respiratory:  Positive for cough, sputum production, shortness of breath and wheezing. Negative for hemoptysis.   Cardiovascular:  Negative for chest pain, palpitations and leg swelling.     Objective:   Vitals:   06/05/23 1314  BP: 122/76  Pulse: 71  SpO2: 99%  Weight: 171 lb (77.6 kg)  Height: 5\' 4"  (1.626 m)    SpO2: 99 %  Physical Exam: General: Well-appearing, no acute distress HENT: [place], AT Eyes: EOMI, no scleral icterus Respiratory: Clear to auscultation bilaterally.  No crackles, wheezing or rales Cardiovascular: RRR, -M/R/G, no JVD Extremities:-Edema,-tenderness Neuro: AAO x4, CNII-XII grossly intact Psych: Normal mood, normal affect  Data Reviewed:  Imaging: CTA 06/24/22 - No PE. Upper lobe tree in bud nodularity CT Chest 07/10/22 - Mild patchy tree in bud in upper lobes bilaterally with mild  cylindrical bronchiolectasis. New 4 mm RUL nodule CT Chest 11/29/22 - Stable findings with mild tree in bud and mild cylindrical bronchiolectasis  PFT: 11/27/12 FVC 2.20 (88%) FEV1 1.38 (74%) Ratio 63  TLC 82% DLCO 73% Interpretation: Mild obstructive defect with significant bronchodilator response. Mildly reduced DLCO  Labs: CBC    Component Value Date/Time   WBC 9.0 06/24/2022 1250   RBC 4.72 06/24/2022 1250   HGB 15.3 (H) 06/24/2022 1250   HCT 46.1 (H) 06/24/2022 1250   PLT 173 06/24/2022 1250   MCV 97.7 06/24/2022 1250   MCH 32.4 06/24/2022 1250   MCHC 33.2 06/24/2022 1250   RDW 12.4 06/24/2022 1250   LYMPHSABS 1.9 12/21/2021 1140   MONOABS 0.7 12/21/2021 1140   EOSABS 0.1 12/21/2021 1140   BASOSABS 0.1 12/21/2021 1140   Absolute eos 12/21/21 - 100    Assessment & Plan:   Discussion: 89 year old female never smoker with asthma, polymyagia rheumatica on Actemra, HTN, HLD, osteoporosis, GERD, fibromyalgia, anxiety/depression who presents for follow-up. Shortness of breath overall improving with oxygen and inhalers with improved SpO2 readings. However currently in asthma exacerbation.   Chronic hypoxemic respiratory failure  --CONTINUE oxygen for now for goal SpO2 >88% --CONTINUE oxygen with activity and sleep --Encourage more frequent walking at home with walker --Ambulatory O2 with no desaturations --ORDER ONO on room air to qualify for nocturnal O2  Addendum: ED visit ambulatory O2 06/10/23    RT Note: Patient 's initial oxygen saturation on room air at rest was 95%. HR 69, RR 17   Patient oxygen saturation ambulating with the use of a walker while on  room air  = 77%, HR 89, RR 22.  Patient was placed back on 2lpm  with a oxygen saturation =91%., HR 75, RR 18    Will order 2L with activity and sleep on 06/23/23. Patient and family [name]) notified.  Asthma, in exacerbation --Prednisone taper --CONTINUE Symbicort 160-4.5 mcg TWO puffs  in the morning and evening.  Rinse mouth out after use --CONTINUE Albuterol AS NEEDED for shortness of breath or wheezing.   Abnormal CT -likely related to recent pneumonia. Discussed that this could be atypical infection such as MAC however not a good candidate for diagnostic testing or treatment. No stigmata of ILD Minimal bronchiectasis - may be related to recurrent infections +/- aspiration. No aspiration symptoms reported. Hold on swallow evaluation --Reviewed CT Chest overall stable. No indication for bronchoscopy at this time  Health Maintenance Immunization History  Administered Date(s) Administered   Fluad Quad(high Dose 65+) 12/22/2018, 01/17/2020, 01/17/2021, 01/03/2022   Fluad Trivalent(High Dose 65+) 12/27/2022   Fluzone Influenza virus vaccine,trivalent (IIV3), split virus 12/22/2012   Influenza Split 12/19/2011, 12/16/2012, 12/30/2013   Influenza Whole 12/17/2011   Influenza, High Dose Seasonal PF 12/30/2013, 12/12/2015, 12/24/2016, 01/01/2018   Influenza-Unspecified 01/17/2020   PFIZER(Purple Top)SARS-COV-2 Vaccination 10/25/2019, 11/15/2019   Pneumococcal Conjugate-13 12/17/2010   Pneumococcal Polysaccharide-23 06/29/2008, 12/30/2013   Tdap 04/11/2015   Zoster Recombinant(Shingrix) 09/09/2017, 01/01/2018   CT Lung Screen - not qualified. Age, insufficient tobacco hx  Orders Placed This Encounter  Procedures   Pulse oximetry, overnight    On room air    Standing Status:   Future    Expiration Date:   06/04/2024   Meds ordered this encounter  Medications   predniSONE (DELTASONE) 10 MG tablet    Sig: Take 4 tablets (40 mg total) by mouth daily with breakfast for 2 days, THEN 3 tablets (30 mg total) daily with breakfast for 2 days, THEN 2 tablets (20 mg total) daily with breakfast for 2 days, THEN 1 tablet (10 mg total) daily with breakfast for 2 days.    Dispense:  20 tablet    Refill:  0    Return in about 6 months (around 12/06/2023).  I have spent a total time of32-minutes on the day of  the appointment including chart review, data review, collecting history, coordinating care and discussing medical diagnosis and plan with the patient/family. Past medical history, allergies, medications were reviewed. Pertinent imaging, labs and tests included in this note have been reviewed and interpreted independently by me.  Darris Carachure Mechele Collin, MD  Pulmonary Critical Care 06/05/2023 1:46 PM

## 2023-06-05 NOTE — Patient Instructions (Signed)
 Chronic hypoxemic respiratory failure  --CONTINUE oxygen for now for goal SpO2 >88% --CONTINUE oxygen with activity and sleep --Encourage more frequent walking at home with walker --Ambulatory O2 with no desaturations --ORDER ONO on room air to qualify for nocturnal O2  Asthma, in exacerbation --Prednisone taper --CONTINUE Symbicort 160-4.5 mcg TWO puffs in the morning and evening. Rinse mouth out after use --CONTINUE Albuterol AS NEEDED for shortness of breath or wheezing.  --OK to take mucinex --Stay hydrated

## 2023-06-09 ENCOUNTER — Other Ambulatory Visit: Payer: Self-pay | Admitting: Family Medicine

## 2023-06-10 ENCOUNTER — Encounter (HOSPITAL_BASED_OUTPATIENT_CLINIC_OR_DEPARTMENT_OTHER): Payer: Self-pay

## 2023-06-10 ENCOUNTER — Emergency Department (HOSPITAL_BASED_OUTPATIENT_CLINIC_OR_DEPARTMENT_OTHER)

## 2023-06-10 ENCOUNTER — Other Ambulatory Visit: Payer: Self-pay

## 2023-06-10 ENCOUNTER — Ambulatory Visit: Payer: Self-pay | Admitting: Pulmonary Disease

## 2023-06-10 ENCOUNTER — Emergency Department (HOSPITAL_BASED_OUTPATIENT_CLINIC_OR_DEPARTMENT_OTHER)
Admission: EM | Admit: 2023-06-10 | Discharge: 2023-06-10 | Disposition: A | Discharge status: Home / Self Care | Attending: Emergency Medicine | Admitting: Emergency Medicine

## 2023-06-10 ENCOUNTER — Encounter (HOSPITAL_BASED_OUTPATIENT_CLINIC_OR_DEPARTMENT_OTHER): Payer: Self-pay | Admitting: Pulmonary Disease

## 2023-06-10 DIAGNOSIS — R059 Cough, unspecified: Secondary | ICD-10-CM | POA: Diagnosis not present

## 2023-06-10 DIAGNOSIS — Z7982 Long term (current) use of aspirin: Secondary | ICD-10-CM | POA: Diagnosis not present

## 2023-06-10 DIAGNOSIS — D72829 Elevated white blood cell count, unspecified: Secondary | ICD-10-CM | POA: Diagnosis not present

## 2023-06-10 DIAGNOSIS — R0602 Shortness of breath: Secondary | ICD-10-CM | POA: Diagnosis present

## 2023-06-10 DIAGNOSIS — I700 Atherosclerosis of aorta: Secondary | ICD-10-CM | POA: Diagnosis not present

## 2023-06-10 DIAGNOSIS — J40 Bronchitis, not specified as acute or chronic: Secondary | ICD-10-CM | POA: Diagnosis not present

## 2023-06-10 DIAGNOSIS — I517 Cardiomegaly: Secondary | ICD-10-CM | POA: Diagnosis not present

## 2023-06-10 DIAGNOSIS — J069 Acute upper respiratory infection, unspecified: Secondary | ICD-10-CM | POA: Diagnosis not present

## 2023-06-10 DIAGNOSIS — I2510 Atherosclerotic heart disease of native coronary artery without angina pectoris: Secondary | ICD-10-CM | POA: Diagnosis not present

## 2023-06-10 DIAGNOSIS — B9789 Other viral agents as the cause of diseases classified elsewhere: Secondary | ICD-10-CM | POA: Diagnosis not present

## 2023-06-10 LAB — CBC WITH DIFFERENTIAL/PLATELET
Abs Immature Granulocytes: 0.13 10*3/uL — ABNORMAL HIGH (ref 0.00–0.07)
Basophils Absolute: 0.1 10*3/uL (ref 0.0–0.1)
Basophils Relative: 0 %
Eosinophils Absolute: 0.1 10*3/uL (ref 0.0–0.5)
Eosinophils Relative: 0 %
HCT: 36 % (ref 36.0–46.0)
Hemoglobin: 12 g/dL (ref 12.0–15.0)
Immature Granulocytes: 1 %
Lymphocytes Relative: 6 %
Lymphs Abs: 1.3 10*3/uL (ref 0.7–4.0)
MCH: 32.6 pg (ref 26.0–34.0)
MCHC: 33.3 g/dL (ref 30.0–36.0)
MCV: 97.8 fL (ref 80.0–100.0)
Monocytes Absolute: 1.4 10*3/uL — ABNORMAL HIGH (ref 0.1–1.0)
Monocytes Relative: 7 %
Neutro Abs: 17.2 10*3/uL — ABNORMAL HIGH (ref 1.7–7.7)
Neutrophils Relative %: 86 %
Platelets: 205 10*3/uL (ref 150–400)
RBC: 3.68 MIL/uL — ABNORMAL LOW (ref 3.87–5.11)
RDW: 14.1 % (ref 11.5–15.5)
WBC: 20.2 10*3/uL — ABNORMAL HIGH (ref 4.0–10.5)
nRBC: 0 % (ref 0.0–0.2)

## 2023-06-10 LAB — BRAIN NATRIURETIC PEPTIDE: B Natriuretic Peptide: 292.4 pg/mL — ABNORMAL HIGH (ref 0.0–100.0)

## 2023-06-10 LAB — BASIC METABOLIC PANEL
Anion gap: 9 (ref 5–15)
BUN: 34 mg/dL — ABNORMAL HIGH (ref 8–23)
CO2: 28 mmol/L (ref 22–32)
Calcium: 9.1 mg/dL (ref 8.9–10.3)
Chloride: 103 mmol/L (ref 98–111)
Creatinine, Ser: 1.12 mg/dL — ABNORMAL HIGH (ref 0.44–1.00)
GFR, Estimated: 46 mL/min — ABNORMAL LOW (ref >60)
Glucose, Bld: 152 mg/dL — ABNORMAL HIGH (ref 70–99)
Potassium: 4.5 mmol/L (ref 3.5–5.1)
Sodium: 140 mmol/L (ref 135–145)

## 2023-06-10 LAB — RESP PANEL BY RT-PCR (RSV, FLU A&B, COVID)  RVPGX2
Influenza A by PCR: NEGATIVE
Influenza B by PCR: NEGATIVE
Resp Syncytial Virus by PCR: NEGATIVE
SARS Coronavirus 2 by RT PCR: NEGATIVE

## 2023-06-10 MED ORDER — METHYLPREDNISOLONE SODIUM SUCC 125 MG IJ SOLR
125.0000 mg | Freq: Once | INTRAMUSCULAR | Status: AC
  Administered 2023-06-10: 125 mg via INTRAVENOUS
  Filled 2023-06-10: qty 2

## 2023-06-10 MED ORDER — IOHEXOL 350 MG/ML SOLN
100.0000 mL | Freq: Once | INTRAVENOUS | Status: AC | PRN
Start: 2023-06-10 — End: 2023-06-10
  Administered 2023-06-10: 75 mL via INTRAVENOUS

## 2023-06-10 MED ORDER — BENZONATATE 100 MG PO CAPS: 100.0000 mg | ORAL_CAPSULE | Freq: Three times a day (TID) | ORAL | 0 refills | Status: DC

## 2023-06-10 MED ORDER — DOXYCYCLINE HYCLATE 100 MG PO CAPS: 100.0000 mg | ORAL_CAPSULE | Freq: Two times a day (BID) | ORAL | 0 refills | Status: DC

## 2023-06-10 MED ORDER — ALBUTEROL SULFATE (2.5 MG/3ML) 0.083% IN NEBU
2.5000 mg | INHALATION_SOLUTION | Freq: Once | RESPIRATORY_TRACT | Status: AC
  Administered 2023-06-10: 2.5 mg via RESPIRATORY_TRACT
  Filled 2023-06-10: qty 3

## 2023-06-10 MED ORDER — IPRATROPIUM-ALBUTEROL 0.5-2.5 (3) MG/3ML IN SOLN
3.0000 mL | Freq: Once | RESPIRATORY_TRACT | Status: AC
Start: 2023-06-10 — End: 2023-06-10
  Administered 2023-06-10: 3 mL via RESPIRATORY_TRACT
  Filled 2023-06-10: qty 3

## 2023-06-10 MED ORDER — MAGNESIUM SULFATE 2 GM/50ML IV SOLN
2.0000 g | Freq: Once | INTRAVENOUS | Status: AC
  Administered 2023-06-10: 2 g via INTRAVENOUS
  Filled 2023-06-10: qty 50

## 2023-06-10 NOTE — ED Triage Notes (Signed)
 In for eval of coughing episode that would not stop this morning. Was seen by pulmonary on Thursday. Delsym and muscinex added to medication regiment. Home O2 @ 1.5 LPM regularly.

## 2023-06-10 NOTE — Telephone Encounter (Signed)
 This encounter was created in error - please disregard.

## 2023-06-10 NOTE — Telephone Encounter (Addendum)
 Call from [employer], spoke to [place], office reporting pt experiencing worsening cough, needs a strong antibx. Speaking to pt daughter.  TRIAGE SUMMARY NOTE: Pt daughter is poor historian. Pt daughter reporting that pt was seen in office on 3/20 and that they were instructed by [name] to call back if pt not feeling better and that doc would send in script for strong antibx, pt daughter also requesting something for cough be sent in as well. Pt daughter reporting that pt's sputum is now a darker yellow and pt having labored breathing, using nebulizer 3-4x per day but not needing more than every 4 hours, family giving her nebulizer to break up mucus rather than pt requesting it. Pt daughter denies pt struggling to breathe then stating that pt's "stomach jerks" when trying to breathe, "it's like a struggle to breathe." Pt daughter unsure of pulse ox for pt but confirms that pt is perkier and breathing not as labored with oxygen and after nebulizer. Pt daughter reporting pt also fatigued and cough seems worse than at appt, pt having chest pain only with coughing. Pt daughter reporting they are trying to push pt to drink more water. Advised pt be examined today with another appt, pt daughter declines at this time, unable to make appt today, requesting HP message be sent requesting meds as discussed at recent visit and call back with further recommendations. Advised seek immediate care if pt struggling to breathe, chest pain, or seeming out of it. Pt daughter verbalized understanding.  Daughter called back within few minutes after completed phone call with nurse, [facility] pulm office reporting daughter stating pt is worsening, pulm office transferring daughter back to nurse. Front desk reporting that she transferred to other E2C2 pulm nurse on accident, other nurse talking to pt daughter now to further assess.  E2C2 Pulmonary Triage - Initial Assessment Questions "Chief Complaint (e.g., cough, sob,  wheezing, fever, chills, sweat or additional symptoms) *Go to specific symptom protocol after initial questions. SOB when loses her breath after coughing so much Coughing up phlegm and it's changed colors, yellow and turned a little darker, really hard for her to get it up she said it's like glue, just the constant coughing, chest hurts from the coughing, only when coughing not otherwise, no blood, yellow and stringy No fever, no wheezing No chills, sweating, or heart racing [name] said if she wasn't feeling any better to call and she'd call in an antibx, think needs something for cough too, just saw her on 3/20 and gave steroid then Coughing is worse than at appt Not really SOB, but when coughing so much she loses her breath and getting really tired this is wearing her down Pushing water Not struggling to breathe, she's breathing hard, like her stomach jerks when she's trying like to breathe, like a struggle to breathe Oxygen and nebulizer help, she improves with these not as hard to breathe and perkier  MEDICINES:   "Have you used any OTC meds to help with symptoms?" Yes If yes, ask "What medications?" Mucinex, delsym  "Have you used your inhalers/maintenance medication?" Yes If yes, "What medications?" Albuterol nebulizer 3x a day, may have even done it 4x, not needing it every 4 hours, she doesn't even ask for it we're giving it to her to keep things getting broken up  OXYGEN: "Do you wear supplemental oxygen?" Yes If yes, "How many liters are you supposed to use?" 2L just uses that at night but having to have it more now  because of all this coughing  "Do you monitor your oxygen levels?" Yes If yes, "What is your reading (oxygen level) today?" Not sure recently, brother takes it  Reason for Disposition  [1] Continuous (nonstop) coughing AND [2] keeps from working or sleeping AND [3] not improved after 2 or 3 inhaler or nebulizer treatments given 20 minutes apart  Answer  Assessment - Initial Assessment Questions 4. HEMOPTYSIS: "Are you coughing up any blood?" If so ask: "How much?" (flecks, streaks, tablespoons, etc.)     denies 7. CARDIAC HISTORY: "Do you have any history of heart disease?" (e.g., heart attack, congestive heart failure)      HTN 8. LUNG HISTORY: "Do you have any history of lung disease?"  (e.g., pulmonary embolus, asthma, emphysema)     asthma  Protocols used: Cough - Acute Productive-A-AH, Asthma Attack-A-AH

## 2023-06-10 NOTE — ED Provider Notes (Signed)
 Received patient in turnover from Dr. Wilkie Aye.  Please see their note for further details of Hx, PE.  Briefly patient is a 89 y.o. female with a Cough .  Patient with upper respiratory syndrome difficulty breathing.  Not significantly improving with beta agonist therapy.  Leukocytosis.  Plan for CT angiogram of the chest.  CT scan of the chest is negative for PE.  Patient has a similar tree-in-bud appearance to prior.  She would like to try and go home.  Was able to stand and ambulate here without significant issues.  Course of antibiotics.  PCP follow-up.    Melene Plan, DO 06/10/23 [phone]

## 2023-06-10 NOTE — Telephone Encounter (Signed)
  Chief Complaint: SOB Symptoms: worsening SOB, cough  Disposition: [x] ED /[] Urgent Care (no appt availability in office) / [] Appointment(In office/virtual)/ []  [facility]/ [] Home Care/ [] Refused Recommended Disposition /[] [facility]/ []  Follow-up with PCP Additional Notes:  Pt daughter [name] calling again-- reports pt is worse, and reports coughing has not stopped. Per daughter, pt is having severe SOB. Of note, [name] is not currently with patient and is giving second hand information from brother that is with patient so Triage was limited. Based on information gathered, triager advised ED for further evaluation/tx.  E2C2 Pulmonary Triage - Initial Assessment Questions "Chief Complaint (e.g., cough, sob, wheezing, fever, chills, sweat or additional symptoms) *Go to specific symptom protocol after initial questions. Wheezing, some productive yellow coughing Recent acute visit and prescribed steroid - reports starting Friday  "How long have symptoms been present?" Since last acute visit  Have you tested for COVID or Flu? Note: If not, ask patient if a home test can be taken. If so, instruct patient to call back for positive results. No, daughter reports exacerbation was triggered by allergy  MEDICINES:   "Have you used any OTC meds to help with symptoms?" Yes If yes, ask "What medications?" Mucinex - no relief  "Have you used your inhalers/maintenance medication?" Yes If yes, "What medications?" Symbicort - 2 puffs twice daily Albuterol neb 3 x a day - reports having to give 4x  If inhaler, ask "How many puffs and how often?" Note: Review instructions on medication in the chart. See above  OXYGEN: "Do you wear supplemental oxygen?" Yes If yes, "How many liters are you supposed to use?" 2 L  "Do you monitor your oxygen levels?" No, currently not with pt If yes, "What is your reading (oxygen level) today?" N/a  "What is your usual oxygen saturation  reading?"  (Note: Pulmonary O2 sats should be 90% or greater) N/a  Reason for Disposition  Sounds like a life-threatening emergency to the triager  Protocols used: Breathing Difficulty-A-AH

## 2023-06-10 NOTE — Discharge Instructions (Signed)
 Continue taking your steroids.  Take the antibiotic as prescribed.  Please let your family doctor and pulmonologist know about your visit here today.

## 2023-06-10 NOTE — ED Notes (Addendum)
 RT Note: Patient 's initial oxygen saturation on room air at rest was 95%. HR 69, RR 17  Patient oxygen saturation ambulating with the use of a walker while on  room air  = 77%, HR 89, RR 22.  Patient was placed back on 2lpm [place] with a oxygen saturation =91%., HR 75, RR 18

## 2023-06-10 NOTE — Telephone Encounter (Signed)
 Agree with ED evaluation. Patient failed outpatient therapy.

## 2023-06-10 NOTE — ED Notes (Signed)
 RT Note: Patient came in with oxygen on at 2lpm [place]. She was placed on 2lpm [place] and SPO2 97%

## 2023-06-10 NOTE — ED Provider Notes (Signed)
 [place] EMERGENCY DEPARTMENT AT [facility] Provider Note   CSN: [id number] Arrival date & time: 06/10/23  1144     History  Chief Complaint  Patient presents with   Cough    [name] is a 89 y.o. female.  HPI   89 year old female presents to the emergency department with concern for profuse cough, productive of yellow phlegm.  Patient uses 2 L nasal cannula at night with physical activity however recently is using it constantly secondary to shortness of breath.  Patient follows with pulmonology for severe asthma.  Was seen about 5 days ago with increased phlegm production and shortness of breath.  Was placed on prednisone taper and Mucinex as well as Delsym without significant relief.  She been having worsening coughing spells which is what prompted her to come in for evaluation today.  No sick contacts.  Denies fever but does endorse chills.  No hemoptysis or lower leg swelling.  Home Medications Prior to Admission medications   Medication Sig Start Date End Date Taking? Authorizing Provider  albuterol (PROVENTIL) (2.5 MG/3ML) 0.083% nebulizer solution Take 3 mLs (2.5 mg total) by nebulization every 6 (six) hours as needed for wheezing or shortness of breath. 02/18/23   Jeani Sow, MD  albuterol (VENTOLIN HFA) 108 [phone] Base) MCG/ACT inhaler USE 2 INHALATIONS UP TO EVERY 4 HOURS IF CANNOT CATCH YOUR BREATH 05/03/21   Jeani Sow, MD  amLODipine (NORVASC) 2.5 MG tablet TAKE 1 TABLET DAILY 09/04/22   Jeani Sow, MD  aspirin EC 81 MG tablet Take by mouth. 11/26/04   [provider]  budesonide-formoterol (SYMBICORT) 160-4.5 MCG/ACT inhaler USE 2 INHALATIONS TWICE A DAY 09/11/22   Jeani Sow, MD  buPROPion (WELLBUTRIN XL) 150 MG 24 hr tablet Take 1 tablet (150 mg total) by mouth daily. 12/27/22   Jeani Sow, MD  calcium-vitamin D (OSCAL WITH D) 500-200 MG-UNIT tablet Take 1 tablet by mouth 3 (three) times daily. 01/11/19   Tarry Kos, MD   cephALEXin (KEFLEX) 250 MG capsule Take 250 mg by mouth at bedtime. 03/30/21   [provider]  Cholecalciferol (VITAMIN D3) 20 MCG (800 UNIT) TABS Take 1 tablet by mouth daily. 08/19/19   Kuneff, Renee A, DO  cyanocobalamin (VITAMIN B12) 1000 MCG/ML injection INJECT 1000 MCG WEEKLY FOR 4 WEEKS THEN MONTHLY THEREAFTER 06/20/22   Jeani Sow, MD  diazepam (VALIUM) 5 MG tablet Take 1 tablet (5 mg total) by mouth every 12 (twelve) hours as needed for anxiety. 12/27/22   Jeani Sow, MD  docusate sodium (COLACE) 100 MG capsule Take 100 mg by mouth 2 (two) times daily.    [provider]  estradiol (ESTRACE) 0.1 MG/GM vaginal cream Place vaginally. 03/30/21   [provider]  memantine (NAMENDA) 5 MG tablet TAKE 1 TABLET TWICE A DAY 09/30/22   Jeani Sow, MD  neomycin-colistin-hydrocortisone-thonzonium (CORTISPORIN-TC) 3.05-18-08-0.5 MG/ML OTIC suspension Place 3 drops into the right ear 4 (four) times daily. 08/17/20   Claiborne Billings, Renee A, DO  olmesartan (BENICAR) 40 MG tablet TAKE 1 TABLET DAILY 10/08/22   Jeani Sow, MD  omega-3 acid ethyl esters (LOVAZA) 1 g capsule TAKE 2 CAPSULES TWICE A DAY 06/27/22   Jeani Sow, MD  ondansetron (ZOFRAN-ODT) 8 MG disintegrating tablet PLACE 1 TABLET ON TONGUE EVERY 8 HOURS AS NEEDED FOR NAUSEA OR VOMITING 08/28/22   Jeani Sow, MD  polyethylene glycol (MIRALAX / GLYCOLAX) 17 g packet Take 17 g  by mouth daily.    [provider]  predniSONE (DELTASONE) 10 MG tablet Take 4 tablets (40 mg total) by mouth daily with breakfast for 2 days, THEN 3 tablets (30 mg total) daily with breakfast for 2 days, THEN 2 tablets (20 mg total) daily with breakfast for 2 days, THEN 1 tablet (10 mg total) daily with breakfast for 2 days. 06/05/23 06/13/23  Luciano Cutter, MD  predniSONE (DELTASONE) 5 MG tablet Take 10 mg by mouth daily. 07/22/16   [provider]  Probiotic Product (PROBIOTIC PO) Take by mouth daily.     [provider]  RESTASIS 0.05 % ophthalmic emulsion 1 drop 2 (two) times daily. 04/05/20   [provider]  rOPINIRole (REQUIP) 1 MG tablet TAKE 1 TABLET IN THE MORNING AND 2 TABLETS IN THE EVENING 06/09/23   Jeani Sow, MD  sertraline (ZOLOFT) 50 MG tablet Take 50 mg by mouth daily. 12/11/22   [provider]  SYRINGE-NEEDLE, DISP, 3 ML (B-D 3CC LUER-LOK SYR 25GX1") 25G X 1" 3 ML MISC Use weekly x 4 weeks then monthly with B 12 04/09/22   Jeani Sow, MD  tocilizumab (ACTEMRA) 400 MG/20ML SOLN injection  11/27/17   [provider]  tocilizumab 4 mg/kg in sodium chloride 0.9 % Inject 280 mg into the vein. 12/03/16   [provider]  traMADol (ULTRAM) 50 MG tablet TAKE ONE TABLET BY MOUTH EVERY 6 HOURS AS NEEDED FOR MODERATE PAIN 11/12/17   Kerrin Champagne, MD      Allergies    Atorvastatin, Oxycodone, Statins, Advair diskus [fluticasone-salmeterol], and Sulfonamide derivatives    Review of Systems   Review of Systems  Constitutional:  Positive for chills and fatigue. Negative for fever.  Respiratory:  Positive for cough, choking and shortness of breath.   Cardiovascular:  Negative for chest pain and leg swelling.  Gastrointestinal:  Negative for abdominal pain, diarrhea and vomiting.  Musculoskeletal:  Negative for back pain.  Skin:  Negative for rash.  Neurological:  Negative for headaches.    Physical Exam Updated Vital Signs BP (!) 142/125 (BP Location: Left Arm)   Pulse 75   Temp 97.7 F (36.5 C)   Resp (!) 22   Ht 5\' 4"  (1.626 m)   Wt 77.6 kg   SpO2 98%   BMI 29.35 kg/m  Physical Exam Vitals and nursing note reviewed.  Constitutional:      General: She is not in acute distress.    Appearance: Normal appearance. She is ill-appearing.  HENT:     Head: Normocephalic.     Mouth/Throat:     Mouth: Mucous membranes are moist.  Cardiovascular:     Rate and Rhythm: Normal rate.  Pulmonary:     Effort: No respiratory  distress.     Breath sounds: Wheezing and rales present.     Comments: tachypnea Abdominal:     Palpations: Abdomen is soft.     Tenderness: There is no abdominal tenderness.  Musculoskeletal:        General: No swelling.  Skin:    General: Skin is warm.  Neurological:     Mental Status: She is alert and oriented to person, place, and time. Mental status is at baseline.  Psychiatric:        Mood and Affect: Mood normal.     ED Results / Procedures / Treatments   Labs (all labs ordered are listed, but only abnormal results are displayed) Labs Reviewed  CBC  WITH DIFFERENTIAL/PLATELET - Abnormal; Notable for the following components:      Result Value   WBC 20.2 (*)    RBC 3.68 (*)    Neutro Abs 17.2 (*)    Monocytes Absolute 1.4 (*)    Abs Immature Granulocytes 0.13 (*)    All other components within normal limits  BASIC METABOLIC PANEL - Abnormal; Notable for the following components:   Glucose, Bld 152 (*)    BUN 34 (*)    Creatinine, Ser 1.12 (*)    GFR, Estimated 46 (*)    All other components within normal limits  BRAIN NATRIURETIC PEPTIDE - Abnormal; Notable for the following components:   B Natriuretic Peptide 292.4 (*)    All other components within normal limits  RESP PANEL BY RT-PCR (RSV, FLU A&B, COVID)  [id number]    EKG EKG Interpretation Date/Time:  Tuesday June 10 2023 11:56:32 EDT Ventricular Rate:  75 PR Interval:  124 QRS Duration:  134 QT Interval:  428 QTC Calculation: 477 R Axis:   66  Text Interpretation: Normal sinus rhythm Non-specific intra-ventricular conduction block T wave abnormality, consider lateral ischemia Abnormal ECG When compared with ECG of 24-Jun-2022 13:14, PREVIOUS ECG IS PRESENT LBBB, similar to previous Confirmed by Coralee Pesa [id number]) on 06/10/2023 12:04:17 PM  Radiology No results found.  Procedures Procedures    Medications Ordered in ED Medications  magnesium sulfate IVPB 2 g 50 mL (2 g Intravenous New Bag/Given  06/10/23 1404)  ipratropium-albuterol (DUONEB) 0.5-2.5 (3) MG/3ML nebulizer solution 3 mL (3 mLs Nebulization Given 06/10/23 1205)  albuterol (PROVENTIL) (2.5 MG/3ML) 0.083% nebulizer solution 2.5 mg (2.5 mg Nebulization Given 06/10/23 1205)  methylPREDNISolone sodium succinate (SOLU-MEDROL) 125 mg/2 mL injection 125 mg (125 mg Intravenous Given 06/10/23 1400)    ED Course/ Medical Decision Making/ A&P                                 Medical Decision Making Amount and/or Complexity of Data Reviewed Labs: ordered. Radiology: ordered.  Risk Prescription drug management.   89 year old female presents emergency department with profuse cough, not resolved with prednisone taper, Mucinex and Delsym.  Now using her 2 L nasal cannula constantly.  No fever or chills, possible blood-tinged sputum a couple days ago.  Blood work shows a leukocytosis, possibly from the prednisone taper.  Blood work is otherwise baseline, troponin is negative, BNP is slightly elevated.  Chest x-ray shows cardiomegaly which is baseline for the patient other bronchitic changes.  No focal pneumonia.  After breathing treatment and medicine here patient is improved but continues to have profuse coughing and now pleuritic pain.  Will do CT PE study to rule out any other acute pathology and possibly identify pneumonia not seen on chest x-ray.  Patient signed out pending imaging results and reevaluation/ambulation.        Final Clinical Impression(s) / ED Diagnoses Final diagnoses:  None    Rx / DC Orders ED Discharge Orders     None         Rozelle Logan, DO 06/10/23 1529

## 2023-06-12 ENCOUNTER — Encounter: Payer: Self-pay | Admitting: Family Medicine

## 2023-06-17 ENCOUNTER — Telehealth (HOSPITAL_BASED_OUTPATIENT_CLINIC_OR_DEPARTMENT_OTHER): Payer: Self-pay | Admitting: Pulmonary Disease

## 2023-06-17 NOTE — Telephone Encounter (Signed)
 Copied from CRM [id number]. Topic: Appointments - Appointment Scheduling >> Jun 17, 2023 12:38 PM [name] S wrote: Patient/patient representative is calling to schedule an appointment. Refer to attachments for appointment information.  Patient is calling to ask if she needs to follow up with [name], patient was discharged from the ER and told to follow up with her pulmonologist and pcp, patient already has one appointment scheduled, please contact patient. Also, [employer] sent patient a oximeter test but patient got it done at the ER, patient would like to know if that test is sufficient enough, please contact patient at [phone]   Spoke with daughter 4/1. Scheduled patient for 4/29 with [name] for a possible ER follow up. Daughter states pulse ox while in the ER was 77.   Please advise and call back

## 2023-06-17 NOTE — Telephone Encounter (Signed)
 Per pts daughter they tried to complete the ONO but pt kept taking it off during sleep so test was no good when picked up by [employer]. Pt was walked in the ER and she dropped to 77% per daughter. Question is do they need to do repeat ONO or will the walk at 77% be the only qualification required? [name] also states pt is beginning to feel some better and she has PCP followup on 06/27/2023, I did instruct her to keep her April appt with you in case they needed it. Pt daughter advised you were out of the office until 06/23/2023 she is okay with answer then as this is not an urgent request

## 2023-06-19 ENCOUNTER — Other Ambulatory Visit: Payer: Self-pay | Admitting: *Deleted

## 2023-06-19 MED ORDER — ALBUTEROL SULFATE (2.5 MG/3ML) 0.083% IN NEBU: 2.5000 mg | INHALATION_SOLUTION | Freq: Four times a day (QID) | RESPIRATORY_TRACT | 3 refills | Status: DC | PRN

## 2023-06-23 ENCOUNTER — Other Ambulatory Visit (HOSPITAL_BASED_OUTPATIENT_CLINIC_OR_DEPARTMENT_OTHER): Payer: Self-pay

## 2023-06-23 ENCOUNTER — Ambulatory Visit: Payer: Self-pay

## 2023-06-23 ENCOUNTER — Other Ambulatory Visit: Payer: Self-pay | Admitting: Family Medicine

## 2023-06-23 ENCOUNTER — Encounter (HOSPITAL_BASED_OUTPATIENT_CLINIC_OR_DEPARTMENT_OTHER): Payer: Self-pay | Admitting: Pulmonary Disease

## 2023-06-23 DIAGNOSIS — E781 Pure hyperglyceridemia: Secondary | ICD-10-CM

## 2023-06-23 DIAGNOSIS — J9611 Chronic respiratory failure with hypoxia: Secondary | ICD-10-CM

## 2023-06-23 DIAGNOSIS — J4540 Moderate persistent asthma, uncomplicated: Secondary | ICD-10-CM

## 2023-06-23 MED ORDER — ALBUTEROL SULFATE (2.5 MG/3ML) 0.083% IN NEBU: 2.5000 mg | INHALATION_SOLUTION | Freq: Four times a day (QID) | RESPIRATORY_TRACT | 3 refills | Status: DC | PRN

## 2023-06-23 MED ORDER — ALBUTEROL SULFATE (2.5 MG/3ML) 0.083% IN NEBU
2.5000 mg | INHALATION_SOLUTION | Freq: Four times a day (QID) | RESPIRATORY_TRACT | 3 refills | Status: DC | PRN
Start: 2023-06-23 — End: 2023-06-23

## 2023-06-23 NOTE — Telephone Encounter (Signed)
 Dr.Ellison can you please advise   Thank you

## 2023-06-23 NOTE — Addendum Note (Signed)
 Addended by: Luciano Cutter on: 06/23/2023 04:31 PM   Modules accepted: Orders

## 2023-06-23 NOTE — Telephone Encounter (Addendum)
 Chief Complaint: medication refill Additional Notes: Pt daughter [name] calling requesting albuterol NEB refill. [name] reports that pt was previously using albuterol NEB 3-4 x a day and this medication was previously prescribed by PCP. Pt now has transitioned to [employer], but Rx was not reordered. Pt no longer has medication, but does have albuterol INH. [name] denies any acute sx at this time, and per daughter, is significantly better than initial onset of sx. Triager advised continuing all respiratory tx and to use albuterol INH in interim for any wheezing/SOB until NEB refill can be sent. Caregiver verbalized understanding and to call back with worsening symptoms.    Copied from CRM [id number]. Topic: Clinical - Red Word Triage >> Jun 23, 2023  3:13 PM Orinda Kenner C wrote: Red Word that prompted transfer to Nurse Triage: Patient's child [name] [phone] states patient was advised to continue albuterol (PROVENTIL) (2.5 MG/3ML) 0.083% nebulizer solution, however patient is out of medication for 2-3 days. [name] can tell the difference in patient's breathing -wheezing, shortness of breath is maintain with oxygen, and losing her voice. [name] states these symptoms were what led patient to the ER. Dr. Ruthine Dose is the original prescriber, however [facility] will not fill medication until 10 days from not. Patient cannot wait for another 4 days until 06/27/23. Please advise and call back.   [id number] - [place], [place] - [address] [place] [address] Phone: [phone] Fax: [phone] Reason for Disposition  [1] Prescription refill request for ESSENTIAL medicine (i.e., likelihood of harm to patient if not taken) AND [2] triager unable to refill per department policy  Answer Assessment - Initial Assessment Questions E2C2 Pulmonary Triage - Initial Assessment Questions "Chief Complaint (e.g., cough, sob, wheezing, fever, chills, sweat or additional symptoms) *Go to specific  symptom protocol after initial questions. Wheezing, coughing  "How long have symptoms been present?" X few days  Have you tested for COVID or Flu? Note: If not, ask patient if a home test can be taken. If so, instruct patient to call back for positive results. No  MEDICINES:   "Have you used any OTC meds to help with symptoms?" Yes If yes, ask "What medications?" Delsym Mucinex with minimal relief  "Have you used your inhalers/maintenance medication?" Yes If yes, "What medications?" Albuterol neb - was giving 3-4x daily -- now out of rx Symbicort 2 puffs twice daily Albuterol INH  If inhaler, ask "How many puffs and how often?" Note: Review instructions on medication in the chart. See above  OXYGEN: "Do you wear supplemental oxygen?" Yes If yes, "How many liters are you supposed to use?" 2L   "Do you monitor your oxygen levels?" Yes If yes, "What is your reading (oxygen level) today?" 93% HR 71  "What is your usual oxygen saturation reading?"  (Note: Pulmonary O2 sats should be 90% or greater) unknown   1. RESPIRATORY STATUS: "Describe your breathing?" (e.g., wheezing, shortness of breath, unable to speak, severe coughing)      wheezing 3. PATTERN "Does the difficult breathing come and go, or has it been constant since it started?"      intermittent 4. SEVERITY: "How bad is your breathing?" (e.g., mild, moderate, severe)    - MILD: No SOB at rest, mild SOB with walking, speaks normally in sentences, can lie down, no retractions, pulse < 100.    - MODERATE: SOB at rest, SOB with minimal exertion and prefers to sit, cannot lie down flat, speaks in phrases, mild  retractions, audible wheezing, pulse 100-120.    - SEVERE: Very SOB at rest, speaks in single words, struggling to breathe, sitting hunched forward, retractions, pulse > 120      SOB with coughing 6. CARDIAC HISTORY: "Do you have any history of heart disease?" (e.g., heart attack, angina, bypass surgery,  angioplasty)      HTN 7. LUNG HISTORY: "Do you have any history of lung disease?"  (e.g., pulmonary embolus, asthma, emphysema)     asthma  Answer Assessment - Initial Assessment Questions 1. DRUG NAME: "What medicine do you need to have refilled?"     Albuterol NEB 2. REFILLS REMAINING: "How many refills are remaining?" (Note: The label on the medicine or pill bottle will show how many refills are remaining. If there are no refills remaining, then a renewal may be needed.)     none 4. PRESCRIBING HCP: "Who prescribed it?" Reason: If prescribed by specialist, call should be referred to that group.     Dr. Ruthine Dose - PCP initially prescribed, but is now seeing pulm and needs refill 5. SYMPTOMS: "Do you have any symptoms?"     Wheezing controlled with albuterol  Protocols used: Breathing Difficulty-A-AH, Medication Refill and Renewal Call-A-AH

## 2023-06-23 NOTE — Telephone Encounter (Signed)
 I have called daughter and we will send order to DME ([employer]) new oxygen order. Pending insurance they may still require an ambulatory in the office. Will update recent clinic note from 06/05/23 with ambulatory O2 sats from ED visit on 06/10/23.

## 2023-06-23 NOTE — Addendum Note (Signed)
 Addended by: Amada Kingfisher on: 06/23/2023 04:39 PM   Modules accepted: Orders

## 2023-06-23 NOTE — Telephone Encounter (Signed)
Order sent to DME

## 2023-06-23 NOTE — Telephone Encounter (Signed)
 RT ambulatory O2 demonstrates desaturations on 06/10/23     RT Note: Patient 's initial oxygen saturation on room air at rest was 95%. HR 69, RR 17   Patient oxygen saturation ambulating with the use of a walker while on  room air  = 77%, HR 89, RR 22.  Patient was placed back on 2lpm [place] with a oxygen saturation =91%., HR 75, RR 18      Please order 2L with activity and sleep. Will addend clinic note from 06/05/23.  Mechele Collin, M.D. [facility] Pulmonary/Critical Care Medicine 06/23/2023 4:28 PM   See Amion for personal pager For hours between 7 PM to 7 AM, please call Elink for urgent questions

## 2023-06-27 ENCOUNTER — Encounter: Payer: Self-pay | Admitting: Family Medicine

## 2023-06-27 ENCOUNTER — Ambulatory Visit (INDEPENDENT_AMBULATORY_CARE_PROVIDER_SITE_OTHER): Payer: Medicare Other | Admitting: Family Medicine

## 2023-06-27 VITALS — BP 143/52 | HR 71 | Temp 97.5°F | Resp 18 | Ht 64.0 in | Wt 174.5 lb

## 2023-06-27 DIAGNOSIS — F418 Other specified anxiety disorders: Secondary | ICD-10-CM

## 2023-06-27 DIAGNOSIS — G2581 Restless legs syndrome: Secondary | ICD-10-CM

## 2023-06-27 DIAGNOSIS — I10 Essential (primary) hypertension: Secondary | ICD-10-CM

## 2023-06-27 DIAGNOSIS — R051 Acute cough: Secondary | ICD-10-CM | POA: Diagnosis not present

## 2023-06-27 DIAGNOSIS — J4541 Moderate persistent asthma with (acute) exacerbation: Secondary | ICD-10-CM

## 2023-06-27 DIAGNOSIS — R4181 Age-related cognitive decline: Secondary | ICD-10-CM | POA: Diagnosis not present

## 2023-06-27 DIAGNOSIS — M353 Polymyalgia rheumatica: Secondary | ICD-10-CM

## 2023-06-27 MED ORDER — OMEPRAZOLE 40 MG PO CPDR
40.0000 mg | DELAYED_RELEASE_CAPSULE | Freq: Two times a day (BID) | ORAL | 1 refills | Status: DC
Start: 2023-06-27 — End: 2023-07-20

## 2023-06-27 MED ORDER — SPACER/AERO-HOLDING CHAMBERS DEVI: 1.0000 [IU] | Freq: Every day | 0 refills | Status: DC

## 2023-06-27 MED ORDER — SPIRIVA RESPIMAT 2.5 MCG/ACT IN AERS: 2.0000 | INHALATION_SPRAY | Freq: Every day | RESPIRATORY_TRACT | 1 refills | Status: DC

## 2023-06-27 NOTE — Patient Instructions (Signed)
 It was very nice to see you today!  Adding spiriva and omeprazole twice daily   PLEASE NOTE:  If you had any lab tests please let us know if you have not heard back within a few days. You may see your results on MyChart before we have a chance to review them but we will give you a call once they are reviewed by [place]. If we ordered any referrals today, please let us know if you have not heard from their office within the next week.   Please try these tips to maintain a healthy lifestyle:  Eat most of your calories during the day when you are active. Eliminate processed foods including packaged sweets (pies, cakes, cookies), reduce intake of potatoes, white bread, white pasta, and white rice. Look for whole grain options, oat flour or almond flour.  Each meal should contain half fruits/vegetables, one quarter protein, and one quarter carbs (no bigger than a computer mouse).  Cut down on sweet beverages. This includes juice, soda, and sweet tea. Also watch fruit intake, though this is a healthier sweet option, it still contains natural sugar! Limit to 3 servings daily.  Drink at least 1 glass of water with each meal and aim for at least 8 glasses per day  Exercise at least 150 minutes every week.

## 2023-06-27 NOTE — Progress Notes (Signed)
 Subjective:     Patient ID: [name], female    DOB: Dec 11, 1929, 89 y.o.   MRN: [id number]  Chief Complaint  Patient presents with   Medical Management of Chronic Issues    6 month follow-up Soreness in chest due to coughing Not fasting    HPI 3/25 Discussed the use of AI scribe software for clinical note transcription with the patient, who gave verbal consent to proceed.  History of Present Illness [name] is a 89 year old female with asthma who presents with persistent coughing and wheezing. She is accompanied by her daughter Almira Armour.  She has been experiencing persistent coughing and wheezing, which led to an emergency room visit. Initially seen on March 20th, her symptoms worsened, including a significant cough, and she was prescribed steroids. By March 25th, her condition had not improved, prompting the ER visit where she was given doxycycline and additional steroids. Despite these treatments, she continues to experience a persistent cough, especially when talking, and wheezing. The cough worsens with talking and is present daily. No fevers, but she notes a runny nose and congestion, likely due to pollen exposure. She has been using albuterol every four hours and Symbicort twice daily, but the cough persists.  She has a history of asthma and is affected by seasonal allergies, particularly pollen. Her daughter notes that her blood pressure tends to rise when she coughs excessively. She has also been experiencing increased fatigue and requires more rest, including naps during the day.  Her medication regimen includes prednisone 5 mg daily, albuterol as needed, Symbicort twice daily, and doxycycline from the ER visit. She also takes amlodipine 2.5 mg and olmesartan 40 mg for blood pressure management, and Keflex daily for urinary tract infections. She has been using oxygen more frequently due to breathlessness associated with coughing.  In the past, she has had a nodule on her  thyroid, which was removed. She has not been experiencing any swallowing difficulties or choking. Her caregiver mentions that she has been using a saline nasal spray but avoids other nasal sprays due to previous epistaxis.  Depression-better on zoloft and wellbutrin.   Memory worsening.  Can remember recent bday celebration    Health Maintenance Due  Topic Date Due   Medicare Annual Wellness (AWV)  07/29/2023    Past Medical History:  Diagnosis Date   Abdominal bloating    Abnormal LFTs (liver function tests)    Ankle fracture, bimalleolar, closed, right, initial encounter    Anxiety disorder    Arthritis    Asthma    Bimalleolar ankle fracture, right, closed, initial encounter 01/06/2019   Depression    Diverticulosis    Edema    Fatty liver    Fibromyalgia    GERD (gastroesophageal reflux disease)    Giant cell arteritis (HCC)    No biopsy secondary to steroid use   Hx of adenomatous colonic polyps    Hyperlipidemia    Hypertension    IBS (irritable bowel syndrome)    Lumbar radiculitis    Osteoporosis 09/2017   T score -2.6 left femoral neck   Peptic ulcer disease    Skin cancer    shoulder, leg, right eye (? squamous by resection)   Thoracic radiculitis    Thyroid nodule    "removed"   Urinary incontinence    Vertigo     Past Surgical History:  Procedure Laterality Date   ABDOMINAL HYSTERECTOMY     BSO   BACK SURGERY  2005  lower back   EYE SURGERY Right 2006   LIVER BIOPSY     fatty liver   NASAL SEPTUM SURGERY     ORIF ANKLE FRACTURE Right 01/11/2019   Procedure: OPEN REDUCTION INTERNAL FIXATION (ORIF) RIGHT BIMALLEOLAR ANKLE FRACTURE;  Surgeon: Wes Hamman, MD;  Location: [facility];  Service: Orthopedics;  Laterality: Right;   right knee surgery  2006   arthroscopy   right leg skin cancer surgery  2014   right shoulder skin cancer excision  2012   SKIN CANCER EXCISION     THYROIDECTOMY       Current Outpatient Medications:     albuterol (PROVENTIL) (2.5 MG/3ML) 0.083% nebulizer solution, Take 3 mLs (2.5 mg total) by nebulization every 6 (six) hours as needed for wheezing or shortness of breath., Disp: 360 mL, Rfl: 3   albuterol (PROVENTIL) (2.5 MG/3ML) 0.083% nebulizer solution, Take 3 mLs (2.5 mg total) by nebulization every 6 (six) hours as needed for wheezing or shortness of breath., Disp: 360 mL, Rfl: 3   albuterol (VENTOLIN HFA) 108 (90 Base) MCG/ACT inhaler, USE 2 INHALATIONS UP TO EVERY 4 HOURS IF CANNOT CATCH YOUR BREATH, Disp: 18 g, Rfl: 1   amLODipine (NORVASC) 2.5 MG tablet, TAKE 1 TABLET DAILY, Disp: 90 tablet, Rfl: 3   aspirin EC 81 MG tablet, Take by mouth., Disp: , Rfl:    budesonide-formoterol (SYMBICORT) 160-4.5 MCG/ACT inhaler, USE 2 INHALATIONS TWICE A DAY, Disp: 30.6 g, Rfl: 3   buPROPion (WELLBUTRIN XL) 150 MG 24 hr tablet, Take 1 tablet (150 mg total) by mouth daily., Disp: 90 tablet, Rfl: 1   calcium-vitamin D (OSCAL WITH D) 500-200 MG-UNIT tablet, Take 1 tablet by mouth 3 (three) times daily., Disp: 90 tablet, Rfl: 6   cephALEXin (KEFLEX) 250 MG capsule, Take 250 mg by mouth at bedtime., Disp: , Rfl:    Cholecalciferol (VITAMIN D3) 20 MCG (800 UNIT) TABS, Take 1 tablet by mouth daily., Disp: 90 tablet, Rfl: 3   cyanocobalamin (VITAMIN B12) 1000 MCG/ML injection, INJECT 1000 MCG WEEKLY FOR 4 WEEKS THEN MONTHLY THEREAFTER, Disp: 10 mL, Rfl: 3   diazepam (VALIUM) 5 MG tablet, Take 1 tablet (5 mg total) by mouth every 12 (twelve) hours as needed for anxiety., Disp: 100 tablet, Rfl: 1   docusate sodium (COLACE) 100 MG capsule, Take 100 mg by mouth 2 (two) times daily., Disp: , Rfl:    estradiol (ESTRACE) 0.1 MG/GM vaginal cream, Place vaginally., Disp: , Rfl:    memantine (NAMENDA) 5 MG tablet, TAKE 1 TABLET TWICE A DAY, Disp: 180 tablet, Rfl: 3   neomycin-colistin-hydrocortisone-thonzonium (CORTISPORIN-TC) 3.05-18-08-0.5 MG/ML OTIC suspension, Place 3 drops into the right ear 4 (four) times daily., Disp:  10 mL, Rfl: 0   olmesartan (BENICAR) 40 MG tablet, TAKE 1 TABLET DAILY, Disp: 90 tablet, Rfl: 3   omega-3 acid ethyl esters (LOVAZA) 1 g capsule, TAKE 2 CAPSULES TWICE A DAY, Disp: 360 capsule, Rfl: 3   omeprazole (PRILOSEC) 40 MG capsule, Take 1 capsule (40 mg total) by mouth in the morning and at bedtime., Disp: 60 capsule, Rfl: 1   ondansetron (ZOFRAN-ODT) 8 MG disintegrating tablet, PLACE 1 TABLET ON TONGUE EVERY 8 HOURS AS NEEDED FOR NAUSEA OR VOMITING, Disp: 30 tablet, Rfl: 35   polyethylene glycol (MIRALAX / GLYCOLAX) 17 g packet, Take 17 g by mouth daily., Disp: , Rfl:    Probiotic Product (PROBIOTIC PO), Take by mouth daily., Disp: , Rfl:    RESTASIS 0.05 %  ophthalmic emulsion, 1 drop 2 (two) times daily., Disp: , Rfl:    rOPINIRole (REQUIP) 1 MG tablet, TAKE 1 TABLET IN THE MORNING AND 2 TABLETS IN THE EVENING, Disp: 270 tablet, Rfl: 3   sertraline (ZOLOFT) 50 MG tablet, Take 50 mg by mouth daily., Disp: , Rfl:    Spacer/Aero-Holding Chambers DEVI, 1 Units by Does not apply route daily., Disp: 1 Units, Rfl: 0   SYRINGE-NEEDLE, DISP, 3 ML (B-D 3CC LUER-LOK SYR 25GX1") 25G X 1" 3 ML MISC, Use weekly x 4 weeks then monthly with B 12, Disp: 50 each, Rfl: 0   Tiotropium Bromide Monohydrate (SPIRIVA RESPIMAT) 2.5 MCG/ACT AERS, Inhale 2 Inhalations into the lungs daily at 12 noon., Disp: 1 each, Rfl: 1   tocilizumab (ACTEMRA) 400 MG/20ML SOLN injection, , Disp: , Rfl:    tocilizumab 4 mg/kg in sodium chloride 0.9 %, Inject 280 mg into the vein., Disp: , Rfl:    traMADol (ULTRAM) 50 MG tablet, TAKE ONE TABLET BY MOUTH EVERY 6 HOURS AS NEEDED FOR MODERATE PAIN, Disp: 40 tablet, Rfl: 0  Allergies  Allergen Reactions   Atorvastatin Other (See Comments)    Severe headache   Oxycodone Itching    hallucinations   Statins    Advair Diskus [Fluticasone-Salmeterol] Other (See Comments)    Mouth sores   Sulfonamide Derivatives Rash   ROS neg/noncontributory except as noted HPI/below Occ c/o R  lower abd pain.      Objective:     BP (!) 143/52   Pulse 71   Temp (!) 97.5 F (36.4 C) (Temporal)   Resp 18   Ht 5\' 4"  (1.626 m)   Wt 174 lb 8 oz (79.2 kg)   SpO2 96%   BMI 29.95 kg/m  Wt Readings from Last 3 Encounters:  06/27/23 174 lb 8 oz (79.2 kg)  06/10/23 171 lb (77.6 kg)  06/05/23 171 lb (77.6 kg)    Physical Exam   Gen: WDWN elderly female in w/c.  Eyes closed but opens and interactive.  Coughing intermitt HEENT: NCAT, conjunctiva not injected, sclera nonicteric NECK:  supple, no thyromegaly, no nodes, no carotid bruits. Op normal CARDIAC: RRR, S1S2+, no murmur. DP 2+B LUNGS: CTAB. No wheezes x from throat area-not further in lungs.  Good ae.  ABDOMEN:  BS+, soft, NTND, No HSM, no masses EXT:  no edema MSK: no gross abnormalities.  NEURO: A&O x1.  CN II-XII intact.  PSYCH: normal mood. Good eye contact  Reviewed ER records    Assessment & Plan:  Acute cough -     Ambulatory referral to ENT  Moderate persistent asthma with acute exacerbation  HYPERTENSION, BENIGN  RLS (restless legs syndrome)  Age-related cognitive decline  Depression with anxiety  Polymyalgia rheumatica (HCC)  Other orders -     Omeprazole; Take 1 capsule (40 mg total) by mouth in the morning and at bedtime.  Dispense: 60 capsule; Refill: 1 -     Spiriva Respimat; Inhale 2 Inhalations into the lungs daily at 12 noon.  Dispense: 1 each; Refill: 1 -     Spacer/Aero-Holding Chambers; 1 Units by Does not apply route daily.  Dispense: 1 Units; Refill: 0  Assessment and Plan Assessment & Plan Chronic Cough   She experiences a persistent cough worsened by talking and breathing, without fever. Tests for COVID, flu, and RSV are negative. Possible causes include vocal cord dysfunction, reflux, or asthma exacerbation, with wheezing likely originating from the throat. Asthma and potential reflux may  contribute to throat irritation. Current treatment includes Symbicort and albuterol.  Persistent symptoms despite treatment warrant an ENT referral for vocal cord evaluation. Start omeprazole twice daily for potential reflux. Consider adding a Spiriva inhaler and discuss nebulizer options with pulmonology.  Asthma   Her chronic asthma recently exacerbated. She uses Symbicort twice daily and albuterol every four hours. Wheezing may be worsened by pollen and environmental factors, with seasonal exacerbations noted. Consider adding Spiriva, . Nebulizer options are considered for better administration due to inhaler difficulty. Continue Symbicort and albuterol as needed.  Hypertension   Her blood pressure is well-controlled on amlodipine and olmesartan, with increases during coughing episodes. Continue amlodipine 2.5 mg daily and olmesartan 40 mg daily.  Restless Legs Syndrome   She is on ropinirole 1 mg three times daily, but adherence may be an issue. Symptoms may not be fully controlled. Emphasize adherence to prescribed dosing for symptom control.- may only be getting bid rather than tid  Chronic Pain   Intermittent pain is managed with tramadol as needed, exacerbated by coughing. Current management is appropriate given the intermittent nature of pain. Continue tramadol as needed.  PMR-managed by rheum-on Actemera and pred 5mg  daily  Follow-up   Follow up with pulmonology at the end of the month. Prompt ENT referral is recommended to avoid delays in addressing persistent symptoms.    Return in about 3 months (around 09/26/2023) for chronic follow-up.  Ellsworth Haas, MD

## 2023-06-28 MED ORDER — FORMOTEROL FUMARATE 20 MCG/2ML IN NEBU: 20.0000 ug | INHALATION_SOLUTION | Freq: Two times a day (BID) | RESPIRATORY_TRACT | 1 refills | Status: DC

## 2023-06-28 MED ORDER — BUDESONIDE 1 MG/2ML IN SUSP: 1.0000 mg | Freq: Two times a day (BID) | RESPIRATORY_TRACT | 2 refills | Status: DC

## 2023-07-04 DIAGNOSIS — M316 Other giant cell arteritis: Secondary | ICD-10-CM | POA: Diagnosis not present

## 2023-07-04 DIAGNOSIS — M2550 Pain in unspecified joint: Secondary | ICD-10-CM | POA: Diagnosis not present

## 2023-07-04 DIAGNOSIS — Z7960 Long term (current) use of unspecified immunomodulators and immunosuppressants: Secondary | ICD-10-CM | POA: Diagnosis not present

## 2023-07-04 NOTE — Progress Notes (Addendum)
 Rheumatology at [facility] Date of Service: 07/04/2023 Patient Name: [name] Patient DOB: 1929-06-01  Vital Signs BP (!) 154/51 (BP Location: Left arm, Patient Position: Sitting)   Pulse 65   Ht 1.6 m (5' 3)   Wt 80.3 kg (177 lb)   SpO2 92%   BMI 31.35 kg/m  CC/HPI [name] is here for a follow up for GCA and osteoporosis Monitoring of chronic therapy.   No hot, swollen joints. No headaches. No jaw claudication Peripheral neuropathy, numbness in feet. Pt with early dementia.    Medications: prednisone  5 mg a day, tramadol  50 mg prn pain, (Actemra  infusions) 280 mg IV every 28 days Pt tolerates tx well.     Osteoporosis: Patient d/c alendronate  due to GI upset   Vaccines: Patient has already received flu vaccine Hx of cancers: no Infections: 2024 PNA, now bronchitis   Physical Exam Pt is alert and not in distress. Answers questions appropriately. Pt with early dementia. Heart: normal S1, S2. No S3, no thrills. No JVD. Lungs: On portable oxygen . B/l wheezes. Extremities: no cyanosis, no edema, no calves tenderness. Skin: warm, dry. No hot, swollen joints   Assessment 1. Giant cell arteritis    (CMD)      2. Arthralgia, unspecified joint  predniSONE  (DELTASONE ) 5 mg tablet    3. Long-term use of immunosuppressant medication       Orders Postpone Actemra  infusion by 2 weeks due to ongoing cough/bronchitis. Return in about 6 months (around 01/03/2024).  Electronic signature. I Ileana Arlean Coast, RMA am acting as scribe for Dr. Aldona Ziolkowska,07/04/2023, 11:08 AM

## 2023-07-15 ENCOUNTER — Ambulatory Visit (HOSPITAL_BASED_OUTPATIENT_CLINIC_OR_DEPARTMENT_OTHER): Admitting: Pulmonary Disease

## 2023-07-18 DIAGNOSIS — M316 Other giant cell arteritis: Secondary | ICD-10-CM | POA: Diagnosis not present

## 2023-07-19 ENCOUNTER — Other Ambulatory Visit: Payer: Self-pay | Admitting: Family Medicine

## 2023-07-20 ENCOUNTER — Other Ambulatory Visit: Payer: Self-pay | Admitting: Family Medicine

## 2023-07-23 ENCOUNTER — Other Ambulatory Visit: Payer: Self-pay | Admitting: Family Medicine

## 2023-07-25 ENCOUNTER — Telehealth (HOSPITAL_BASED_OUTPATIENT_CLINIC_OR_DEPARTMENT_OTHER): Admitting: Adult Health

## 2023-07-25 ENCOUNTER — Encounter (HOSPITAL_BASED_OUTPATIENT_CLINIC_OR_DEPARTMENT_OTHER): Payer: Self-pay | Admitting: Adult Health

## 2023-07-25 DIAGNOSIS — J9611 Chronic respiratory failure with hypoxia: Secondary | ICD-10-CM

## 2023-07-25 DIAGNOSIS — J4541 Moderate persistent asthma with (acute) exacerbation: Secondary | ICD-10-CM | POA: Diagnosis not present

## 2023-07-25 MED ORDER — SPIRIVA RESPIMAT 2.5 MCG/ACT IN AERS: 2.0000 | INHALATION_SPRAY | Freq: Every day | RESPIRATORY_TRACT | 3 refills | Status: DC

## 2023-07-25 MED ORDER — ALBUTEROL SULFATE (2.5 MG/3ML) 0.083% IN NEBU: 2.5000 mg | INHALATION_SOLUTION | Freq: Four times a day (QID) | RESPIRATORY_TRACT | 3 refills | Status: DC | PRN

## 2023-07-25 MED ORDER — BUDESONIDE 1 MG/2ML IN SUSP: 1.0000 mg | Freq: Two times a day (BID) | RESPIRATORY_TRACT | 3 refills | Status: DC

## 2023-07-25 NOTE — Progress Notes (Signed)
 Virtual Visit via Video Note  I connected with [name] on 07/25/23 at  3:30 PM EDT by a video enabled telemedicine application and verified that I am speaking with the correct person using two identifiers.  Location: Patient: Home in bed  Provider: Office    I discussed the limitations of evaluation and management by telemedicine and the availability of in person appointments. The patient expressed understanding and agreed to proceed.  History of Present Illness: 89 year old female never smoker followed for asthma and chronic respiratory failure  Today's video visit is a 6-week follow-up on recent asthma exacerbation.  Last visit patient was treated for an asthmatic bronchitic exacerbation.  She was treated with a prednisone  taper.  Symptoms continued to worsen and patient was seen in the emergency room.  CT chest was negative for PE.  Showed some tree-in-bud nodularity in the upper lobes felt to be nonspecific.  She was treated for a bronchitis with doxycycline  x 7 days.  She is accompanied by her daughter today but says that she is doing much better.  Her primary care provider changed her from Symbicort  to budesonide  nebulizer and albuterol  nebulizer twice daily.  She says this is also helped.  She also was started on Spiriva . Denies any hemoptysis, fever or increased edema.  Past Medical History:  Diagnosis Date   Abdominal bloating    Abnormal LFTs (liver function tests)    Ankle fracture, bimalleolar, closed, right, initial encounter    Anxiety disorder    Arthritis    Asthma    Bimalleolar ankle fracture, right, closed, initial encounter 01/06/2019   Depression    Diverticulosis    Edema    Fatty liver    Fibromyalgia    GERD (gastroesophageal reflux disease)    Giant cell arteritis (HCC)    No biopsy secondary to steroid use   Hx of adenomatous colonic polyps    Hyperlipidemia    Hypertension    IBS (irritable bowel syndrome)    Lumbar radiculitis    Osteoporosis  09/2017   T score -2.6 left femoral neck   Peptic ulcer disease    Skin cancer    shoulder, leg, right eye (? squamous by resection)   Thoracic radiculitis    Thyroid  nodule    "removed"   Urinary incontinence    Vertigo    Current Outpatient Medications on File Prior to Visit  Medication Sig Dispense Refill   albuterol  (PROVENTIL ) (2.5 MG/3ML) 0.083% nebulizer solution Take 3 mLs (2.5 mg total) by nebulization every 6 (six) hours as needed for wheezing or shortness of breath. 360 mL 3   albuterol  (VENTOLIN  HFA) 108 (90 Base) MCG/ACT inhaler USE 2 INHALATIONS UP TO EVERY 4 HOURS IF CANNOT CATCH YOUR BREATH 18 g 1   amLODipine  (NORVASC ) 2.5 MG tablet TAKE 1 TABLET DAILY 90 tablet 3   aspirin  EC 81 MG tablet Take by mouth.     buPROPion  (WELLBUTRIN  XL) 150 MG 24 hr tablet TAKE 1 TABLET DAILY 90 tablet 3   calcium -vitamin D  (OSCAL WITH D) 500-200 MG-UNIT tablet Take 1 tablet by mouth 3 (three) times daily. 90 tablet 6   cephALEXin  (KEFLEX ) 250 MG capsule Take 250 mg by mouth at bedtime.     Cholecalciferol (VITAMIN D3) 20 MCG (800 UNIT) TABS Take 1 tablet by mouth daily. 90 tablet 3   cyanocobalamin  (VITAMIN B12) 1000 MCG/ML injection INJECT 1000 MCG WEEKLY FOR 4 WEEKS THEN MONTHLY THEREAFTER 10 mL 3   diazepam  (VALIUM ) 5 MG  tablet Take 1 tablet (5 mg total) by mouth every 12 (twelve) hours as needed for anxiety. 100 tablet 1   docusate sodium (COLACE) 100 MG capsule Take 100 mg by mouth 2 (two) times daily.     estradiol (ESTRACE) 0.1 MG/GM vaginal cream Place vaginally.     formoterol  (PERFOROMIST ) 20 MCG/2ML nebulizer solution Take 2 mLs (20 mcg total) by nebulization 2 (two) times daily. 120 mL 1   memantine  (NAMENDA ) 5 MG tablet TAKE 1 TABLET TWICE A DAY 180 tablet 3   neomycin -colistin-hydrocortisone-thonzonium (CORTISPORIN -TC) 3.05-18-08-0.5 MG/ML OTIC suspension Place 3 drops into the right ear 4 (four) times daily. 10 mL 0   olmesartan  (BENICAR ) 40 MG tablet TAKE 1 TABLET DAILY 90  tablet 3   omega-3 acid ethyl esters (LOVAZA ) 1 g capsule TAKE 2 CAPSULES TWICE A DAY 360 capsule 3   omeprazole  (PRILOSEC) 40 MG capsule TAKE 1 CAPSULE (40 MG TOTAL) BY MOUTH IN THE MORNING AND AT BEDTIME. 180 capsule 1   ondansetron  (ZOFRAN -ODT) 8 MG disintegrating tablet PLACE 1 TABLET ON TONGUE EVERY 8 HOURS AS NEEDED FOR NAUSEA OR VOMITING 30 tablet 35   polyethylene glycol (MIRALAX / GLYCOLAX) 17 g packet Take 17 g by mouth daily.     Probiotic Product (PROBIOTIC PO) Take by mouth daily.     RESTASIS 0.05 % ophthalmic emulsion 1 drop 2 (two) times daily.     rOPINIRole  (REQUIP ) 1 MG tablet TAKE 1 TABLET IN THE MORNING AND 2 TABLETS IN THE EVENING 270 tablet 3   sertraline  (ZOLOFT ) 50 MG tablet TAKE 1 TABLET DAILY 90 tablet 3   Spacer/Aero-Holding Chambers DEVI 1 Units by Does not apply route daily. 1 Units 0   SYRINGE-NEEDLE, DISP, 3 ML (B-D 3CC LUER-LOK SYR 25GX1") 25G X 1" 3 ML MISC Use weekly x 4 weeks then monthly with B 12 50 each 0   tocilizumab  (ACTEMRA ) 400 MG/20ML SOLN injection      tocilizumab  4 mg/kg in sodium chloride  0.9 % Inject 280 mg into the vein.     traMADol  (ULTRAM ) 50 MG tablet TAKE ONE TABLET BY MOUTH EVERY 6 HOURS AS NEEDED FOR MODERATE PAIN 40 tablet 0   No current facility-administered medications on file prior to visit.     Observations/Objective: CTA 06/24/22 - No PE. Upper lobe tree in bud nodularity CT Chest 07/10/22 - Mild patchy tree in bud in upper lobes bilaterally with mild cylindrical bronchiolectasis. New 4 mm RUL nodule CT Chest 11/29/22 - Stable findings with mild tree in bud and mild cylindrical bronchiolectasis   PFT: 11/27/12 FVC 2.20 (88%) FEV1 1.38 (74%) Ratio 63  TLC 82% DLCO 73% Interpretation: Mild obstructive defect with significant bronchodilator response. Mildly reduced DLCO  Assessment and Plan: Asthmatic bronchitic exacerbation now improved.  Continue on regimen with budesonide  and albuterol  nebulizer twice daily.  Spiriva   daily.  Chronic respiratory failure.  Continue on oxygen  2 L to maintain O2 saturations greater than 88 to 90%.  Plan  Patient Instructions  Continue on budesonide  and albuterol  nebulizer twice daily Continue on Spiriva  daily Albuterol  inhaler or nebulizer as needed Continue on oxygen  2 L Activity as tolerated Follow-up with Dr. Washington Hacker in 4 months and as needed n      Follow Up Instructions:    I discussed the assessment and treatment plan with the patient. The patient was provided an opportunity to ask questions and all were answered. The patient agreed with the plan and demonstrated an understanding of the instructions.  The patient was advised to call back or seek an in-person evaluation if the symptoms worsen or if the condition fails to improve as anticipated.  I provided 22  minutes of non-face-to-face time during this encounter.   Roena Clark, NP

## 2023-07-25 NOTE — Patient Instructions (Signed)
 Continue on budesonide  and albuterol  nebulizer twice daily Continue on Spiriva  daily Albuterol  inhaler or nebulizer as needed Continue on oxygen  2 L Activity as tolerated Follow-up with Dr. Washington Hacker in 4 months and as needed

## 2023-07-28 ENCOUNTER — Encounter (HOSPITAL_COMMUNITY): Payer: Self-pay

## 2023-08-04 ENCOUNTER — Ambulatory Visit: Payer: Medicare Other

## 2023-08-05 DIAGNOSIS — R8271 Bacteriuria: Secondary | ICD-10-CM | POA: Diagnosis not present

## 2023-08-05 DIAGNOSIS — N952 Postmenopausal atrophic vaginitis: Secondary | ICD-10-CM | POA: Diagnosis not present

## 2023-08-19 DIAGNOSIS — M316 Other giant cell arteritis: Secondary | ICD-10-CM | POA: Diagnosis not present

## 2023-08-26 ENCOUNTER — Ambulatory Visit (INDEPENDENT_AMBULATORY_CARE_PROVIDER_SITE_OTHER)

## 2023-08-26 VITALS — Ht 65.0 in | Wt 178.0 lb

## 2023-08-26 DIAGNOSIS — Z789 Other specified health status: Secondary | ICD-10-CM | POA: Diagnosis not present

## 2023-08-26 DIAGNOSIS — Z0000 Encounter for general adult medical examination without abnormal findings: Secondary | ICD-10-CM

## 2023-08-26 NOTE — Progress Notes (Signed)
 Subjective:   [name] is a 89 y.o. who presents for a Medicare Wellness preventive visit.  As a reminder, Annual Wellness Visits don't include a physical exam, and some assessments may be limited, especially if this visit is performed virtually. We may recommend an in-person follow-up visit with your provider if needed.  Visit Complete: Virtual I connected with  Skylie Hepworth on 08/26/23 by a audio enabled telemedicine application and verified that I am speaking with the correct person using two identifiers.  Patient Location: Home  Provider Location: Office/Clinic  I discussed the limitations of evaluation and management by telemedicine. The patient expressed understanding and agreed to proceed.  Vital Signs: Because this visit was a virtual/telehealth visit, some criteria may be missing or patient reported. Any vitals not documented were not able to be obtained and vitals that have been documented are patient reported.  VideoDeclined- This patient declined Librarian, academic. Therefore the visit was completed with audio only.  Persons Participating in Visit: Patient assisted by Daughter Almira Armour completed for Mom .  AWV Questionnaire: No: Patient Medicare AWV questionnaire was not completed prior to this visit.  Cardiac Risk Factors include: advanced age (>44men, >85 women);dyslipidemia;hypertension;sedentary lifestyle     Objective:     Today's Vitals   08/26/23 1135  Weight: 178 lb (80.7 kg)  Height: 5\' 5"  (1.651 m)   Body mass index is 29.62 kg/m.     08/26/2023   11:46 AM 07/29/2022    4:26 PM 06/24/2022   12:53 PM 12/21/2021   11:24 AM 06/28/2021   11:32 AM 05/10/2020   10:36 AM 03/14/2020    1:48 PM  Advanced Directives  Does Patient Have a Medical Advance Directive? Yes Yes No Yes No Yes Yes  Type of Estate agent of [place];Living will Healthcare Power of [place];Living will  Healthcare Power of [employer] [place];Living will Healthcare Power of [place];Living will  Copy of Healthcare Power of Attorney in Chart? No - copy requested No - copy requested    No - copy requested   Would patient like information on creating a medical advance directive?     No - Patient declined      Current Medications (verified) Outpatient Encounter Medications as of 08/26/2023  Medication Sig   albuterol  (PROVENTIL ) (2.5 MG/3ML) 0.083% nebulizer solution Take 3 mLs (2.5 mg total) by nebulization every 6 (six) hours as needed for wheezing or shortness of breath.   amLODipine  (NORVASC ) 2.5 MG tablet TAKE 1 TABLET DAILY   aspirin  EC 81 MG tablet Take by mouth.   budesonide  (PULMICORT ) 1 MG/2ML nebulizer solution Take 2 mLs (1 mg total) by nebulization in the morning and at bedtime.   buPROPion  (WELLBUTRIN  XL) 150 MG 24 hr tablet TAKE 1 TABLET DAILY   calcium -vitamin D  (OSCAL WITH D) 500-200 MG-UNIT tablet Take 1 tablet by mouth 3 (three) times daily.   cephALEXin  (KEFLEX ) 250 MG capsule Take 250 mg by mouth at bedtime.   Cholecalciferol (VITAMIN D3) 20 MCG (800 UNIT) TABS Take 1 tablet by mouth daily.   cyanocobalamin  (VITAMIN B12) 1000 MCG/ML injection INJECT 1000 MCG WEEKLY FOR 4 WEEKS THEN MONTHLY THEREAFTER   diazepam  (VALIUM ) 5 MG tablet Take 1 tablet (5 mg total) by mouth every 12 (twelve) hours as needed for anxiety.   docusate sodium (COLACE) 100 MG capsule Take 100 mg by mouth 2 (two) times daily.   estradiol (ESTRACE) 0.1 MG/GM vaginal cream Place vaginally.  formoterol  (PERFOROMIST ) 20 MCG/2ML nebulizer solution Take 2 mLs (20 mcg total) by nebulization 2 (two) times daily.   memantine  (NAMENDA ) 5 MG tablet TAKE 1 TABLET TWICE A DAY   neomycin -colistin-hydrocortisone-thonzonium (CORTISPORIN -TC) 3.05-18-08-0.5 MG/ML OTIC suspension Place 3 drops into the right ear 4 (four) times daily.   olmesartan  (BENICAR ) 40 MG tablet TAKE 1 TABLET DAILY   omega-3 acid ethyl esters (LOVAZA ) 1 g capsule  TAKE 2 CAPSULES TWICE A DAY   omeprazole  (PRILOSEC) 40 MG capsule TAKE 1 CAPSULE (40 MG TOTAL) BY MOUTH IN THE MORNING AND AT BEDTIME.   ondansetron  (ZOFRAN -ODT) 8 MG disintegrating tablet PLACE 1 TABLET ON TONGUE EVERY 8 HOURS AS NEEDED FOR NAUSEA OR VOMITING   polyethylene glycol (MIRALAX / GLYCOLAX) 17 g packet Take 17 g by mouth daily.   Probiotic Product (PROBIOTIC PO) Take by mouth daily.   RESTASIS 0.05 % ophthalmic emulsion 1 drop 2 (two) times daily.   rOPINIRole  (REQUIP ) 1 MG tablet TAKE 1 TABLET IN THE MORNING AND 2 TABLETS IN THE EVENING   sertraline  (ZOLOFT ) 50 MG tablet TAKE 1 TABLET DAILY   Spacer/Aero-Holding Chambers DEVI 1 Units by Does not apply route daily.   SYRINGE-NEEDLE, DISP, 3 ML (B-D 3CC LUER-LOK SYR 25GX1") 25G X 1" 3 ML MISC Use weekly x 4 weeks then monthly with B 12   Tiotropium Bromide Monohydrate  (SPIRIVA  RESPIMAT) 2.5 MCG/ACT AERS Inhale 2 Inhalations into the lungs daily at 12 noon.   tocilizumab  (ACTEMRA ) 400 MG/20ML SOLN injection    tocilizumab  4 mg/kg in sodium chloride  0.9 % Inject 280 mg into the vein.   traMADol  (ULTRAM ) 50 MG tablet TAKE ONE TABLET BY MOUTH EVERY 6 HOURS AS NEEDED FOR MODERATE PAIN   [DISCONTINUED] albuterol  (PROVENTIL ) (2.5 MG/3ML) 0.083% nebulizer solution Take 3 mLs (2.5 mg total) by nebulization every 6 (six) hours as needed for wheezing or shortness of breath.   [DISCONTINUED] albuterol  (VENTOLIN  HFA) 108 (90 Base) MCG/ACT inhaler USE 2 INHALATIONS UP TO EVERY 4 HOURS IF CANNOT CATCH YOUR BREATH   No facility-administered encounter medications on file as of 08/26/2023.    Allergies (verified) Atorvastatin , Oxycodone , Statins, Advair diskus [fluticasone -salmeterol], and Sulfonamide derivatives   History: Past Medical History:  Diagnosis Date   Abdominal bloating    Abnormal LFTs (liver function tests)    Ankle fracture, bimalleolar, closed, right, initial encounter    Anxiety disorder    Arthritis    Asthma    Bimalleolar  ankle fracture, right, closed, initial encounter 01/06/2019   Depression    Diverticulosis    Edema    Fatty liver    Fibromyalgia    GERD (gastroesophageal reflux disease)    Giant cell arteritis (HCC)    No biopsy secondary to steroid use   Hx of adenomatous colonic polyps    Hyperlipidemia    Hypertension    IBS (irritable bowel syndrome)    Lumbar radiculitis    Osteoporosis 09/2017   T score -2.6 left femoral neck   Peptic ulcer disease    Skin cancer    shoulder, leg, right eye (? squamous by resection)   Thoracic radiculitis    Thyroid  nodule    "removed"   Urinary incontinence    Vertigo    Past Surgical History:  Procedure Laterality Date   ABDOMINAL HYSTERECTOMY     BSO   BACK SURGERY  2005   lower back   EYE SURGERY Right 2006   LIVER BIOPSY     fatty liver  NASAL SEPTUM SURGERY     ORIF ANKLE FRACTURE Right 01/11/2019   Procedure: OPEN REDUCTION INTERNAL FIXATION (ORIF) RIGHT BIMALLEOLAR ANKLE FRACTURE;  Surgeon: Wes Hamman, MD;  Location: [facility];  Service: Orthopedics;  Laterality: Right;   right knee surgery  2006   arthroscopy   right leg skin cancer surgery  2014   right shoulder skin cancer excision  2012   SKIN CANCER EXCISION     THYROIDECTOMY     Family History  Problem Relation Age of Onset   Colon polyps Maternal Aunt    Diabetes Father    Heart disease Father    Heart disease Mother    Lung disease Mother    COPD Mother    Lung cancer Brother    Cancer Brother        lung   COPD Brother    Stroke Brother    Lung cancer Sister    COPD Sister    Kidney disease Other        niece-mat side   Arthritis Other    Diabetes Son    Arthritis Daughter    Colon cancer Neg Hx    Social History   Socioeconomic History   Marital status: Widowed    Spouse name: Not on file   Number of children: 3   Years of education: Not on file   Highest education level: Not on file  Occupational History   Occupation: Retired  Chief Financial Officer  Tobacco Use   Smoking status: Never   Smokeless tobacco: Never  Vaping Use   Vaping status: Never Used  Substance and Sexual Activity   Alcohol use: No   Drug use: No   Sexual activity: Not Currently    Comment: intercourser age 68, less than 5 secxual partners  Other Topics Concern   Not on file  Social History Narrative      children Fletcher Humble and Anita-pt lives w/Gary, but others are neighbors.   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   Patient takes a daily vitamin   She wears her seat belt. Exercises greater than 3 times a week.   Wears 2 hearing aids. No dentures.   "Sometimes "requires assistive device for walking, either a walker or wheelchair.   There is a smoke detector in her home. There are firearms in her home. She feels safe in her relationships.   Right handed   Social Drivers of Health   Financial Resource Strain: Low Risk  (08/26/2023)   Overall Financial Resource Strain (CARDIA)    Difficulty of Paying Living Expenses: Not hard at all  Food Insecurity: No Food Insecurity (08/26/2023)   Hunger Vital Sign    Worried About Running Out of Food in the Last Year: Never true    Ran Out of Food in the Last Year: Never true  Transportation Needs: No Transportation Needs (08/26/2023)   PRAPARE - Administrator, Civil Service (Medical): No    Lack of Transportation (Non-Medical): No  Physical Activity: Insufficiently Active (08/26/2023)   Exercise Vital Sign    Days of Exercise per Week: 3 days    Minutes of Exercise per Session: 20 min  Stress: No Stress Concern Present (08/26/2023)   [employer] of Occupational Health - Occupational Stress Questionnaire    Feeling of Stress : Not at all  Social Connections: Socially Isolated (08/26/2023)   Social Connection and Isolation Panel [NHANES]    Frequency of Communication with Friends and Family: Twice  a week    Frequency of Social Gatherings with Friends and Family: Twice a week     Attends Religious Services: Never    Database administrator or Organizations: No    Attends Banker Meetings: Never    Marital Status: Widowed    Tobacco Counseling Counseling given: Not Answered    Clinical Intake:  Pre-visit preparation completed: Yes  Pain : No/denies pain     BMI - recorded: 29.62 Nutritional Status: BMI 25 -29 Overweight Nutritional Risks: None Diabetes: No  No results found for: "HGBA1C"   How often do you need to have someone help you when you read instructions, pamphlets, or other written materials from your doctor or pharmacy?: 1 - Never  Interpreter Needed?: No  Information entered by :: Lamont Pilsner, LPN   Activities of Daily Living     08/26/2023   11:37 AM  In your present state of health, do you have any difficulty performing the following activities:  Hearing? 1  Comment hearing aids  Vision? 0  Difficulty concentrating or making decisions? 1  Comment at times memory  Walking or climbing stairs? 1  Dressing or bathing? 1  Comment daughter assist  Doing errands, shopping? 1  Preparing Food and eating ? Y  Comment daughter and son assist  Using the Toilet? Y  In the past six months, have you accidently leaked urine? Y  Comment brief  Do you have problems with loss of bowel control? Y  Managing your Medications? Y  Comment daughter and son  Managing your Finances? Y  Housekeeping or managing your Housekeeping? Y    Patient Care Team: Christel Cousins, MD as PCP - General (Family Medicine) Ziolkowska, Aldona, MD (Internal Medicine) Elmyra Haggard, MD as Consulting Physician (Cardiology) Pietro Bridegroom, MD (Inactive) (Gastroenterology) Diamond Formica, MD as Consulting Physician (Pulmonary Disease) Alphonso Jean, MD (Inactive) as Consulting Physician (Orthopedic Surgery) Bridget Campion, MD as Consulting Physician (Physical Medicine and Rehabilitation) [facility] (Dentistry) Samson Croak, MD as Consulting Physician (Urology)  I have updated your Care Teams any recent Medical Services you may have received from other providers in the past year.     Assessment:    This is a routine wellness examination for Verl.  Hearing/Vision screen Hearing Screening - Comments:: Pt denies any hearing issues  Vision Screening - Comments:: Wears rx glasses - up to date with routine eye exams [facility]     Goals Addressed             This Visit's Progress    Patient Stated       Continue to increase mobility        Depression Screen     08/26/2023   11:44 AM 02/18/2023    1:14 PM 09/27/2022   11:04 AM 07/29/2022    4:23 PM 06/28/2022    3:54 PM 01/03/2022   11:20 AM 10/30/2021    2:09 PM  PHQ 2/9 Scores  PHQ - 2 Score 0  4 6  5  0  PHQ- 9 Score   16 10  20    Exception Documentation  Medical reason   Medical reason      Fall Risk     08/26/2023   11:47 AM 09/27/2022   11:03 AM 07/29/2022    4:28 PM 04/02/2022   10:52 AM 06/28/2021   11:33 AM  Fall Risk   Falls in the past  year? 0 0 0 0 0  Number falls in past yr: 0 0 0 0 0  Injury with Fall? 0 0 0 0 0  Risk for fall due to : Impaired mobility;Impaired balance/gait No Fall Risks Impaired vision;Impaired balance/gait;Impaired mobility No Fall Risks Impaired vision;Impaired mobility;Impaired balance/gait  Follow up Falls prevention discussed Falls evaluation completed Falls prevention discussed Falls evaluation completed Falls prevention discussed    MEDICARE RISK AT HOME:  Medicare Risk at Home Any stairs in or around the home?: Yes If so, are there any without handrails?: No Home free of loose throw rugs in walkways, pet beds, electrical cords, etc?: Yes Adequate lighting in your home to reduce risk of falls?: Yes Life alert?: No Use of a cane, walker or w/c?: Yes Grab bars in the bathroom?: Yes Shower chair or bench in shower?: Yes Elevated toilet seat or a handicapped toilet?:  No  TIMED UP AND GO:  Was the test performed?  No  Cognitive Function: Pt daughter completed AWV     08/26/2023   11:48 AM 07/15/2017   11:44 AM 07/12/2016   11:30 AM  MMSE - Mini Mental State Exam  Not completed: Unable to complete  Refused  Orientation to time  5   Orientation to Place  5   Registration  3   Attention/ Calculation  3   Recall  1   Language- name 2 objects  2   Language- repeat  0   Language- follow 3 step command  3   Language- read & follow direction  1   Write a sentence  1   Copy design  1   Total score  25         07/29/2022    4:31 PM 06/29/2021    1:24 PM 05/10/2020   10:48 AM  6CIT Screen  What Year? 0 points 0 points 0 points  What month? 0 points 0 points 0 points  What time? 0 points 0 points 0 points  Count back from 20 0 points 0 points 0 points  Months in reverse 4 points 0 points 0 points  Repeat phrase 10 points -- 8 points  Total Score 14 points  8 points    Immunizations Immunization History  Administered Date(s) Administered   Fluad Quad(high Dose 65+) 12/22/2018, 01/17/2020, 01/17/2021, 01/03/2022   Fluad Trivalent(High Dose 65+) 12/27/2022   Fluzone Influenza virus vaccine,trivalent (IIV3), split virus 12/22/2012   Influenza Split 12/19/2011, 12/16/2012, 12/30/2013   Influenza Whole 12/17/2011   Influenza, High Dose Seasonal PF 12/30/2013, 12/12/2015, 12/24/2016, 01/01/2018   Influenza-Unspecified 01/17/2020   PFIZER(Purple Top)SARS-COV-2 Vaccination 10/25/2019, 11/15/2019   Pneumococcal Conjugate-13 12/17/2010   Pneumococcal Polysaccharide-23 06/29/2008, 12/30/2013   Tdap 04/11/2015   Zoster Recombinant(Shingrix) 09/09/2017, 01/01/2018    Screening Tests Health Maintenance  Topic Date Due   COVID-19 Vaccine (3 - Pfizer risk series) 12/13/2019   DEXA SCAN  06/09/2022   INFLUENZA VACCINE  10/17/2023   Medicare Annual Wellness (AWV)  08/25/2024   DTaP/Tdap/Td (2 - Td or Tdap) 04/10/2025   Pneumonia Vaccine 65+ Years  old  Completed   Zoster Vaccines- Shingrix  Completed   HPV VACCINES  Aged Out   Meningococcal B Vaccine  Aged Out    Health Maintenance  Health Maintenance Due  Topic Date Due   COVID-19 Vaccine (3 - Pfizer risk series) 12/13/2019   DEXA SCAN  06/09/2022   Health Maintenance Items Addressed: See Nurse Notes at the end of this note  Additional Screening:  Vision Screening: Recommended annual ophthalmology exams for early detection of glaucoma and other disorders of the eye. Would you like a referral to an eye doctor? No    Dental Screening: Recommended annual dental exams for proper oral hygiene  Community Resource Referral / Chronic Care Management: CRR required this visit?  Yes   CCM required this visit?  No   Plan:    I have personally reviewed and noted the following in the patient's chart:   Medical and social history Use of alcohol, tobacco or illicit drugs  Current medications and supplements including opioid prescriptions. Patient is currently taking opioid prescriptions. Information provided to patient regarding non-opioid alternatives. Patient advised to discuss non-opioid treatment plan with their provider. Functional ability and status Nutritional status Physical activity Advanced directives List of other physicians Hospitalizations, surgeries, and ER visits in previous 12 months Vitals Screenings to include cognitive, depression, and falls Referrals and appointments  In addition, I have reviewed and discussed with patient certain preventive protocols, quality metrics, and best practice recommendations. A written personalized care plan for preventive services as well as general preventive health recommendations were provided to patient.   Bruno Capri, LPN   1/61/0960   After Visit Summary: (MyChart) Due to this being a telephonic visit, the after visit summary with patients personalized plan was offered to patient via MyChart   Notes: Nothing  significant to report at this time.

## 2023-08-26 NOTE — Patient Instructions (Signed)
 [name] , Thank you for taking time out of your busy schedule to complete your Annual Wellness Visit with me. I enjoyed our conversation and look forward to speaking with you again next year. I, as well as your care team,  appreciate your ongoing commitment to your health goals. Please review the following plan we discussed and let me know if I can assist you in the future. Your Game plan/ To Do List    Referrals: If you haven't heard from the office you've been referred to, please reach out to them at the phone provided.   Follow up Visits: Next Medicare AWV with our clinical staff: 08/30/24   Have you seen your provider in the last 6 months (3 months if uncontrolled diabetes)? No Next Office Visit with your provider: 09/30/23  Clinician Recommendations:  Each day, aim for 6 glasses of water, plenty of protein in your diet and try to get up and walk/ stretch every hour for 5-10 minutes at a time.        This is a list of the screening recommended for you and due dates:  Health Maintenance  Topic Date Due   COVID-19 Vaccine (3 - Pfizer risk series) 12/13/2019   DEXA scan (bone density measurement)  06/09/2022   Flu Shot  10/17/2023   Medicare Annual Wellness Visit  08/25/2024   DTaP/Tdap/Td vaccine (2 - Td or Tdap) 04/10/2025   Pneumonia Vaccine  Completed   Zoster (Shingles) Vaccine  Completed   HPV Vaccine  Aged Out   Meningitis B Vaccine  Aged Out    Advanced directives: (Copy Requested) Please bring a copy of your health care power of attorney and living will to the office to be added to your chart at your convenience. You can mail to [facility] [address] [place], [place] [address] or email to [email or web address] Advance Care Planning is important because it:  [x]  Makes sure you receive the medical care that is consistent with your values, goals, and preferences  [x]  It provides guidance to your family and loved ones and reduces their decisional  burden about whether or not they are making the right decisions based on your wishes.  Follow the link provided in your after visit summary or read over the paperwork we have mailed to you to help you started getting your Advance Directives in place. If you need assistance in completing these, please reach out to us  so that we can help you!  See attachments for Preventive Care and Fall Prevention Tips.

## 2023-09-01 ENCOUNTER — Other Ambulatory Visit: Payer: Self-pay | Admitting: Family Medicine

## 2023-09-03 ENCOUNTER — Telehealth: Payer: Self-pay

## 2023-09-03 NOTE — Progress Notes (Signed)
   Telephone encounter was:  Successful.  Complex Care Management Note Care Guide Note  09/03/2023 Name: [name] MRN: [id number] DOB: 1929-08-27  [name] is a 89 y.o. year old female who is a primary care patient of Christel Cousins, MD . The community resource team was consulted for assistance with Medical supplies  SDOH screenings and interventions completed:  No        Care guide performed the following interventions: Patient provided with information about care guide support team and interviewed to confirm resource needs.Pt daughter stated the mother has needs for Pull ups and wheelchair information. I was able to give resources over the phone for [employer] and I will follow up as well If i find other resources   Follow Up Plan:  Care guide will follow up with patient by phone over the next week  Encounter Outcome:  Patient Visit Completed    [name] [facility]  [facility] Guide, Phone: [phone] Fax: [phone] Website: [email or web address]

## 2023-09-09 ENCOUNTER — Institutional Professional Consult (permissible substitution) (INDEPENDENT_AMBULATORY_CARE_PROVIDER_SITE_OTHER): Admitting: Otolaryngology

## 2023-09-15 ENCOUNTER — Other Ambulatory Visit: Payer: Self-pay | Admitting: Family Medicine

## 2023-09-15 DIAGNOSIS — E538 Deficiency of other specified B group vitamins: Secondary | ICD-10-CM

## 2023-09-16 DIAGNOSIS — M316 Other giant cell arteritis: Secondary | ICD-10-CM | POA: Diagnosis not present

## 2023-09-18 ENCOUNTER — Other Ambulatory Visit: Payer: Self-pay | Admitting: Family Medicine

## 2023-09-18 DIAGNOSIS — E538 Deficiency of other specified B group vitamins: Secondary | ICD-10-CM

## 2023-09-18 MED ORDER — CYANOCOBALAMIN 1000 MCG/ML IJ SOLN: 1000.0000 ug | INTRAMUSCULAR | 3 refills | Status: DC

## 2023-09-24 ENCOUNTER — Other Ambulatory Visit: Payer: Self-pay | Admitting: Family Medicine

## 2023-09-30 ENCOUNTER — Encounter: Payer: Self-pay | Admitting: Family Medicine

## 2023-09-30 ENCOUNTER — Ambulatory Visit (INDEPENDENT_AMBULATORY_CARE_PROVIDER_SITE_OTHER): Admitting: Family Medicine

## 2023-09-30 VITALS — BP 135/55 | HR 56 | Temp 98.4°F | Resp 18 | Ht 65.0 in | Wt 172.1 lb

## 2023-09-30 DIAGNOSIS — M316 Other giant cell arteritis: Secondary | ICD-10-CM | POA: Diagnosis not present

## 2023-09-30 DIAGNOSIS — J4540 Moderate persistent asthma, uncomplicated: Secondary | ICD-10-CM

## 2023-09-30 DIAGNOSIS — I10 Essential (primary) hypertension: Secondary | ICD-10-CM | POA: Diagnosis not present

## 2023-09-30 DIAGNOSIS — M542 Cervicalgia: Secondary | ICD-10-CM

## 2023-09-30 DIAGNOSIS — G2581 Restless legs syndrome: Secondary | ICD-10-CM | POA: Diagnosis not present

## 2023-09-30 DIAGNOSIS — F418 Other specified anxiety disorders: Secondary | ICD-10-CM | POA: Diagnosis not present

## 2023-09-30 DIAGNOSIS — R4181 Age-related cognitive decline: Secondary | ICD-10-CM | POA: Diagnosis not present

## 2023-09-30 MED ORDER — DIAZEPAM 5 MG PO TABS: 5.0000 mg | ORAL_TABLET | Freq: Two times a day (BID) | ORAL | 1 refills | Status: DC | PRN

## 2023-09-30 MED ORDER — OLMESARTAN MEDOXOMIL 40 MG PO TABS
40.0000 mg | ORAL_TABLET | Freq: Every day | ORAL | 3 refills | Status: AC
Start: 2023-09-30 — End: ?

## 2023-09-30 MED ORDER — OMEPRAZOLE 40 MG PO CPDR: 40.0000 mg | DELAYED_RELEASE_CAPSULE | Freq: Two times a day (BID) | ORAL | 3 refills | Status: DC

## 2023-09-30 NOTE — Progress Notes (Signed)
 Subjective:     Patient ID: [name], female    DOB: [birth year], 89 y.o.   MRN: [id number]  Chief Complaint  Patient presents with   Medical Management of Chronic Issues    3 month follow-up Skin tear on right lower leg Not fasting     HPI Discussed the use of AI scribe software for clinical note transcription with the patient, who gave verbal consent to proceed.  History of Present Illness [name] is a 89 year old female who presents for a follow-up visit. She is accompanied by her daughter, Donzell.  She experiences fatigue. She uses oxygen  at night and occasionally during the day, especially after being outside in the heat. Her breathing issues tend to worsen in October and March. She is currently using inhalers as she ran out of nebulizer medication. Doing much better than in March.   Her blood pressure is managed with amlodipine  2.5 mg and olmesartan  40 mg daily. No chest pain, vomiting, diarrhea, or leg swelling. Her blood pressures at home have been stable.  She continues to take omeprazole  twice daily for GERD. She is also on Wellbutrin  and Zoloft  for depression.  She takes B12 injections monthly, although there was some confusion with the prescription instructions. She also takes calcium  and vitamin D  supplements, though not consistently.  She uses Requip  for restless legs syndrome. She also takes sertraline , diazepam , and tramadol  as part of her medication regimen.  She has experienced some memory issues and confusion, including an incident where she left the house unexpectedly. Her daughter notes she has been more forgetful and occasionally hears things that are not present.  She has a wound on her right shin from an injury sustained while getting into a van, which is being treated with Neosporin and wrapped. She also has a wound on her right elbow from a similar incident.  Her daughter mentions that she is not eating as much, but she will eat when food is  placed in front of her. She also takes Miralax daily and uses Restasis for her eyes.    Health Maintenance Due  Topic Date Due   DEXA SCAN  06/09/2022    Past Medical History:  Diagnosis Date   Abdominal bloating    Abnormal LFTs (liver function tests)    Ankle fracture, bimalleolar, closed, right, initial encounter    Anxiety disorder    Arthritis    Asthma    Bimalleolar ankle fracture, right, closed, initial encounter 01/06/2019   Depression    Diverticulosis    Edema    Fatty liver    Fibromyalgia    GERD (gastroesophageal reflux disease)    Giant cell arteritis (HCC)    No biopsy secondary to steroid use   Hx of adenomatous colonic polyps    Hyperlipidemia    Hypertension    IBS (irritable bowel syndrome)    Lumbar radiculitis    Osteoporosis 09/2017   T score -2.6 left femoral neck   Peptic ulcer disease    Skin cancer    shoulder, leg, right eye (? squamous by resection)   Thoracic radiculitis    Thyroid  nodule    removed   Urinary incontinence    Vertigo     Past Surgical History:  Procedure Laterality Date   ABDOMINAL HYSTERECTOMY     BSO   BACK SURGERY  2005   lower back   EYE SURGERY Right 2006   LIVER BIOPSY     fatty liver  NASAL SEPTUM SURGERY     ORIF ANKLE FRACTURE Right 01/11/2019   Procedure: OPEN REDUCTION INTERNAL FIXATION (ORIF) RIGHT BIMALLEOLAR ANKLE FRACTURE;  Surgeon: Jerri Kay HERO, MD;  Location: [facility];  Service: Orthopedics;  Laterality: Right;   right knee surgery  2006   arthroscopy   right leg skin cancer surgery  2014   right shoulder skin cancer excision  2012   SKIN CANCER EXCISION     THYROIDECTOMY       Current Outpatient Medications:    albuterol  (PROVENTIL ) (2.5 MG/3ML) 0.083% nebulizer solution, Take 3 mLs (2.5 mg total) by nebulization every 6 (six) hours as needed for wheezing or shortness of breath., Disp: 360 mL, Rfl: 3   amLODipine  (NORVASC ) 2.5 MG tablet, TAKE 1 TABLET DAILY, Disp: 90  tablet, Rfl: 3   aspirin  EC 81 MG tablet, Take by mouth., Disp: , Rfl:    budesonide  (PULMICORT ) 1 MG/2ML nebulizer solution, Take 2 mLs (1 mg total) by nebulization in the morning and at bedtime., Disp: 360 mL, Rfl: 3   buPROPion  (WELLBUTRIN  XL) 150 MG 24 hr tablet, TAKE 1 TABLET DAILY, Disp: 90 tablet, Rfl: 3   calcium -vitamin D  (OSCAL WITH D) 500-200 MG-UNIT tablet, Take 1 tablet by mouth 3 (three) times daily., Disp: 90 tablet, Rfl: 6   cephALEXin  (KEFLEX ) 250 MG capsule, Take 250 mg by mouth at bedtime., Disp: , Rfl:    Cholecalciferol (VITAMIN D3) 20 MCG (800 UNIT) TABS, Take 1 tablet by mouth daily., Disp: 90 tablet, Rfl: 3   cyanocobalamin  (VITAMIN B12) 1000 MCG/ML injection, Inject 1 mL (1,000 mcg total) into the muscle every 30 (thirty) days. INJECT 1000 MCG WEEKLY FOR 4 WEEKS THEN MONTHLY THEREAFTER, Disp: 3 mL, Rfl: 3   docusate sodium (COLACE) 100 MG capsule, Take 100 mg by mouth 2 (two) times daily., Disp: , Rfl:    estradiol (ESTRACE) 0.1 MG/GM vaginal cream, Place vaginally., Disp: , Rfl:    formoterol  (PERFOROMIST ) 20 MCG/2ML nebulizer solution, Take 2 mLs (20 mcg total) by nebulization 2 (two) times daily., Disp: 120 mL, Rfl: 1   memantine  (NAMENDA ) 5 MG tablet, TAKE 1 TABLET TWICE A DAY, Disp: 180 tablet, Rfl: 3   neomycin -colistin-hydrocortisone-thonzonium (CORTISPORIN -TC) 3.05-18-08-0.5 MG/ML OTIC suspension, Place 3 drops into the right ear 4 (four) times daily., Disp: 10 mL, Rfl: 0   omega-3 acid ethyl esters (LOVAZA ) 1 g capsule, TAKE 2 CAPSULES TWICE A DAY, Disp: 360 capsule, Rfl: 3   ondansetron  (ZOFRAN -ODT) 8 MG disintegrating tablet, PLACE 1 TABLET ON TONGUE EVERY 8 HOURS AS NEEDED FOR NAUSEA OR VOMITING, Disp: 30 tablet, Rfl: 35   polyethylene glycol (MIRALAX / GLYCOLAX) 17 g packet, Take 17 g by mouth daily., Disp: , Rfl:    Probiotic Product (PROBIOTIC PO), Take by mouth daily., Disp: , Rfl:    RESTASIS 0.05 % ophthalmic emulsion, 1 drop 2 (two) times daily., Disp: ,  Rfl:    rOPINIRole  (REQUIP ) 1 MG tablet, TAKE 1 TABLET IN THE MORNING AND 2 TABLETS IN THE EVENING, Disp: 270 tablet, Rfl: 3   sertraline  (ZOLOFT ) 50 MG tablet, TAKE 1 TABLET DAILY, Disp: 90 tablet, Rfl: 3   Spacer/Aero-Holding Chambers DEVI, 1 Units by Does not apply route daily., Disp: 1 Units, Rfl: 0   SYRINGE-NEEDLE, DISP, 3 ML (B-D 3CC LUER-LOK SYR 25GX1) 25G X 1 3 ML MISC, Use weekly x 4 weeks then monthly with B 12, Disp: 50 each, Rfl: 0   Tiotropium Bromide Monohydrate  (SPIRIVA  RESPIMAT) 2.5  MCG/ACT AERS, Inhale 2 Inhalations into the lungs daily at 12 noon., Disp: 3 each, Rfl: 3   tocilizumab  (ACTEMRA ) 400 MG/20ML SOLN injection, , Disp: , Rfl:    tocilizumab  4 mg/kg in sodium chloride  0.9 %, Inject 280 mg into the vein., Disp: , Rfl:    traMADol  (ULTRAM ) 50 MG tablet, TAKE ONE TABLET BY MOUTH EVERY 6 HOURS AS NEEDED FOR MODERATE PAIN, Disp: 40 tablet, Rfl: 0   diazepam  (VALIUM ) 5 MG tablet, Take 1 tablet (5 mg total) by mouth every 12 (twelve) hours as needed for anxiety., Disp: 100 tablet, Rfl: 1   olmesartan  (BENICAR ) 40 MG tablet, Take 1 tablet (40 mg total) by mouth daily., Disp: 90 tablet, Rfl: 3   omeprazole  (PRILOSEC) 40 MG capsule, Take 1 capsule (40 mg total) by mouth in the morning and at bedtime., Disp: 180 capsule, Rfl: 3  Allergies  Allergen Reactions   Atorvastatin  Other (See Comments)    Severe headache   Oxycodone  Itching    hallucinations   Statins    Advair Diskus [Fluticasone -Salmeterol] Other (See Comments)    Mouth sores   Sulfonamide Derivatives Rash   ROS neg/noncontributory except as noted HPI/below      Objective:     BP (!) 135/55   Pulse (!) 56   Temp 98.4 F (36.9 C) (Temporal)   Resp 18   Ht 5' 5 (1.651 m)   Wt 172 lb 2 oz (78.1 kg)   SpO2 95%   BMI 28.64 kg/m  Wt Readings from Last 3 Encounters:  09/30/23 172 lb 2 oz (78.1 kg)  08/26/23 178 lb (80.7 kg)  06/27/23 174 lb 8 oz (79.2 kg)    Physical Exam   Gen: WDWN NAD.  Looks  great today and engaged HEENT: NCAT, conjunctiva not injected, sclera nonicteric NECK:  supple, no thyromegaly, no nodes, no carotid bruits CARDIAC: RRR, S1S2+, no murmur. DP 2+B LUNGS: CTAB. No wheezes ABDOMEN:  BS+, soft, NTND, No HSM, no masses EXT:  no edema MSK: no gross abnormalities. In w/c NEURO: A&O x1.  CN II-XII intact.  PSYCH: normal mood. Good eye contact  B shins and R forearm near elbow.- skin tears.  No s/s infection  Reviewed labs from rheum     Assessment & Plan:  Essential hypertension, benign -     Olmesartan  Medoxomil; Take 1 tablet (40 mg total) by mouth daily.  Dispense: 90 tablet; Refill: 3  Moderate persistent asthma without complication  Age-related cognitive decline  Cervicalgia  Depression with anxiety  Giant cell arteritis (HCC)  RLS (restless legs syndrome)  Other orders -     Omeprazole ; Take 1 capsule (40 mg total) by mouth in the morning and at bedtime.  Dispense: 180 capsule; Refill: 3 -     diazePAM ; Take 1 tablet (5 mg total) by mouth every 12 (twelve) hours as needed for anxiety.  Dispense: 100 tablet; Refill: 1  Assessment and Plan Assessment & Plan Dementia   Dementia is progressing with increased forgetfulness, occasional hallucinations, and concerns about nocturnal wandering. Continue Namenda . Consider home safety modifications to prevent wandering and discuss potential use of tracking devices for safety.  Chronic Obstructive Pulmonary Disease (COPD)/asthma   COPD is well-managed with current treatment. Oxygen  is used nocturnally and as needed during the day, especially in hot weather. Continue Symbicort  and albuterol  inhalers. Refill nebulizer medication as needed.  Hypertension   Hypertension is well-controlled with the current medication regimen. No reports of chest pain, vomiting, diarrhea, or  leg swelling. Home blood pressure readings are stable. Continue amlodipine  2.5 mg daily and olmesartan  40 mg daily.  Depression    Depression is stable with the current medication regimen. Continue Wellbutrin  and Zoloft . Valium .  Pdmp checked  Gastroesophageal Reflux Disease (GERD)   GERD is managed with omeprazole . Send prescription to [facility] for convenience.  Restless Legs Syndrome   Restless legs syndrome is managed with Requip . Continue Requip .  Vitamin B12 Deficiency   Vitamin B12 deficiency is managed with monthly injections. Prescription confusion has been resolved, and a 79-month supply will be provided to reduce inconvenience. Continue monthly B12 injections and ensure prescription is for a 30-month supply.    Return in about 3 months (around 12/31/2023) for chronic follow-up.  Jenkins CHRISTELLA Carrel, MD

## 2023-09-30 NOTE — Patient Instructions (Signed)
 It was very nice to see you today!  [place] honey wound care oint.   PLEASE NOTE:  If you had any lab tests please let us  know if you have not heard back within a few days. You may see your results on MyChart before we have a chance to review them but we will give you a call once they are reviewed by us . If we ordered any referrals today, please let us  know if you have not heard from their office within the next week.   Please try these tips to maintain a healthy lifestyle:  Eat most of your calories during the day when you are active. Eliminate processed foods including packaged sweets (pies, cakes, cookies), reduce intake of potatoes, white bread, white pasta, and white rice. Look for whole grain options, oat flour or almond flour.  Each meal should contain half fruits/vegetables, one quarter protein, and one quarter carbs (no bigger than a computer mouse).  Cut down on sweet beverages. This includes juice, soda, and sweet tea. Also watch fruit intake, though this is a healthier sweet option, it still contains natural sugar! Limit to 3 servings daily.  Drink at least 1 glass of water with each meal and aim for at least 8 glasses per day  Exercise at least 150 minutes every week.

## 2023-10-02 ENCOUNTER — Other Ambulatory Visit: Payer: Self-pay | Admitting: Family

## 2023-10-14 DIAGNOSIS — M316 Other giant cell arteritis: Secondary | ICD-10-CM | POA: Diagnosis not present

## 2023-10-22 ENCOUNTER — Other Ambulatory Visit: Payer: Self-pay | Admitting: Family Medicine

## 2023-11-07 ENCOUNTER — Institutional Professional Consult (permissible substitution) (INDEPENDENT_AMBULATORY_CARE_PROVIDER_SITE_OTHER): Admitting: Otolaryngology

## 2023-11-11 DIAGNOSIS — M316 Other giant cell arteritis: Secondary | ICD-10-CM | POA: Diagnosis not present

## 2023-11-14 ENCOUNTER — Ambulatory Visit: Payer: Self-pay | Admitting: Family Medicine

## 2023-11-14 ENCOUNTER — Other Ambulatory Visit: Payer: Self-pay | Admitting: Family Medicine

## 2023-11-14 DIAGNOSIS — F418 Other specified anxiety disorders: Secondary | ICD-10-CM

## 2023-11-14 DIAGNOSIS — R4181 Age-related cognitive decline: Secondary | ICD-10-CM

## 2023-11-14 MED ORDER — BREXPIPRAZOLE 0.25 MG PO TABS: 0.2500 mg | ORAL_TABLET | Freq: Every day | ORAL | 1 refills | Status: DC

## 2023-11-14 NOTE — Telephone Encounter (Signed)
 FYI Only or Action Required?: Action required by provider: update on patient condition.  Patient was last seen in primary care on 09/30/2023 by Wendolyn Jenkins Jansky, MD.  Called Nurse Triage reporting Suicidal.  Symptoms began a week ago.  Symptoms are: gradually worsening.  Triage Disposition: Go to ED Now (Notify PCP)  Patient/caregiver understands and will follow disposition?: No, refuses disposition      Copied from CRM [id number]. Topic: Clinical - Medical Advice >> Nov 14, 2023  2:38 PM Mercedes MATSU wrote: Reason for CRM: Patients daughter [name] has been crying for off and on for two days now, she is saying she wants to die and she is really concerned. She has denied the patient making threats, patient has been aggressive with others but denies hurting herself or others. Patients daughter is requesting a call back at [phone]. Reason for Disposition  [1] Depression symptoms (sadness, hopelessness, decreased energy) AND [2] unable to do any normal activities (e.g., self-care, school, work; in comparison to baseline).  Answer Assessment - Initial Assessment Questions This RN spoke with pt's daughter, Donzell. This RN recommends pt goes to ED but pt daughter states the pt will not go there. This RN also provided pt daughter with the Behavioral Health Urgent Care phone number and address. This RN notified [facility].  Pt daughter is not sure if changes need to be made to pt's depression medications. Pt daughter wants a call back.   MAIN CONCERN: What happened that made you call today?     The past couple of days pt has been really teary; cried on and off the whole day; very negative sounding  Denies HI or SI; has not done anything to try and hurt herself but has been making comments  Pt was sitting by water and states If I could get up and get in the pool and stay under there I would  Pt seems confused- woke up and states where am I  Pt worsening in past 1-1.5  weeks Arrogant when speaking Won't smile, doesn't want to cooperate  Dementia in past couple of years  EVENTS AND STRESSORS: Has there been any new stress or recent changes in your life? (e.g., death of loved one, homelessness, negative event, relationship breakup, work)     Not that I know of. She can't remember things we tell her  Has been eating well; today she told pt son she doesn't want to eat; stated If I don't eat I won't live as long  Pt lives with her son; pt is never alone  Protocols used: Suicide Concerns-A-AH

## 2023-11-14 NOTE — Progress Notes (Signed)
 Spoke to daughter on phone.  Past 2 wks, crying intermitt.  No URI/UTI symptoms.  Nothing different.  But saying wishes she would die(has happened in past).  More agitated than usual.  Has dementia.  Already on ssri and wellbutrin .  Will add brezpiprazole.  For now, try the diazepam  1/2 tab tid and see if will help some.  To behavioral health if worseinging, etc.

## 2023-11-14 NOTE — Telephone Encounter (Signed)
 See message below

## 2023-11-19 ENCOUNTER — Other Ambulatory Visit: Payer: Self-pay | Admitting: Family Medicine

## 2023-11-19 MED ORDER — ARIPIPRAZOLE 2 MG PO TABS: 2.0000 mg | ORAL_TABLET | Freq: Every day | ORAL | 1 refills | Status: DC

## 2023-11-19 NOTE — Progress Notes (Signed)
 Ins not cover rexulti  so will try abilify 

## 2023-11-20 ENCOUNTER — Other Ambulatory Visit (HOSPITAL_COMMUNITY): Payer: Self-pay

## 2023-12-02 ENCOUNTER — Other Ambulatory Visit (HOSPITAL_BASED_OUTPATIENT_CLINIC_OR_DEPARTMENT_OTHER): Payer: Self-pay

## 2023-12-02 ENCOUNTER — Ambulatory Visit (HOSPITAL_BASED_OUTPATIENT_CLINIC_OR_DEPARTMENT_OTHER): Admitting: Pulmonary Disease

## 2023-12-02 ENCOUNTER — Encounter (HOSPITAL_BASED_OUTPATIENT_CLINIC_OR_DEPARTMENT_OTHER): Payer: Self-pay | Admitting: Pulmonary Disease

## 2023-12-02 VITALS — BP 130/66 | HR 80 | Ht 65.0 in | Wt 165.8 lb

## 2023-12-02 DIAGNOSIS — J9611 Chronic respiratory failure with hypoxia: Secondary | ICD-10-CM | POA: Diagnosis not present

## 2023-12-02 DIAGNOSIS — J4530 Mild persistent asthma, uncomplicated: Secondary | ICD-10-CM | POA: Diagnosis not present

## 2023-12-02 MED ORDER — BUDESONIDE-FORMOTEROL FUMARATE 160-4.5 MCG/ACT IN AERO: 2.0000 | INHALATION_SPRAY | Freq: Two times a day (BID) | RESPIRATORY_TRACT | 3 refills | Status: DC

## 2023-12-02 MED ORDER — AREXVY 120 MCG/0.5ML IM SUSR
0.5000 mL | Freq: Once | INTRAMUSCULAR | 0 refills | Status: AC
  Filled 2023-12-02: qty 0.5, 1d supply, fill #0

## 2023-12-02 NOTE — Patient Instructions (Signed)
 Asthma --CONTINUE Symbicort  160-4.5 mcg TWO puffs in the morning and evening. Rinse mouth out after use    OR --Nebulizer: CONTINUE budesonie nebulizer and formoterol  nebulizer in the morning and evening  --CONTINUE Albuterol  AS NEEDED for shortness of breath or wheezing.   Chronic hypoxemic respiratory failure  --CONTINUE 2L oxygen  with activity for goal SpO2 >88% --CONTINUE 2L oxygen  with sleep --Encourage more frequent walking at home with walker

## 2023-12-02 NOTE — Progress Notes (Signed)
 Subjective:   PATIENT ID: [name] GENDER: female DOB: 02-13-1930, MRN: [id number]  Chief Complaint  Patient presents with   Follow-up    Chronic respiratory failure    Reason for Visit: Follow-up  [name] is a 89 year old female never smoker with asthma, polymyagia rheumatica on Actemra , HTN, HLD, osteoporosis, GERD, fibromyalgia, anxiety/depression who presents for follow-up.  Initial consult She has had shortness of breath that worsens with activity for >1 year. Worsens with laying down. She had presented to the ED on 06/24/22 and CTA was concerning for possible pneumonia. She was hypoxemia and started on home O2. Denies chronic cough but occasional hacky cough thought related to sinus drainage. She was seen by her PCP for ED follow-up. Continued to need O2 so was referred to Pulmonary for further work-up. At home she walks with a walker. Sedentary at baseline. She snores at night. Denies witnessed apnea or gasping. She uses Symbicort  and albuterol  once a day, feels asthma is under control   11/29/22 Since our last visit she continues have shortness of breath but improving some. Denies cough or wheezing. Remains sedentary at baseline. Compliant with oxygen , Symbicort  and PRN albuterol . Uses rescue inhaler infrequently. Presents with daughter to review recent CT scan.  06/05/23 Since our last visit she reports shortness of breath with activity. Wears oxygen  with activity and sleep. Denies cough or wheezing. Sedentary at baseline. Compliant with Symbicort . This week developed thick productive cough and wheezing triggered by allergies. Using nebulizer twice a day without improvement. Has not tried over the counter. On claritin. Has post nasal gtt, using saline rinses. Denies fevers, chills.  12/02/23 Since our last visit she is compliant with Symbicort , Spiriva  and oxygen . Occasional shortness of breath requiring rescue inhaler once in awhile. Denies cough and wheezing.  Currently not using nebulizers. Overall doing well since her exacerbation in the spring when she was in the ED 06/10/23.  Social History: Retired Conservation officer, historic buildings exposures: methotrexate in the past   Past Medical History:  Diagnosis Date   Abdominal bloating    Abnormal LFTs (liver function tests)    Ankle fracture, bimalleolar, closed, right, initial encounter    Anxiety disorder    Arthritis    Asthma    Bimalleolar ankle fracture, right, closed, initial encounter 01/06/2019   Depression    Diverticulosis    Edema    Fatty liver    Fibromyalgia    GERD (gastroesophageal reflux disease)    Giant cell arteritis (HCC)    No biopsy secondary to steroid use   Hx of adenomatous colonic polyps    Hyperlipidemia    Hypertension    IBS (irritable bowel syndrome)    Lumbar radiculitis    Osteoporosis 09/2017   T score -2.6 left femoral neck   Peptic ulcer disease    Skin cancer    shoulder, leg, right eye (? squamous by resection)   Thoracic radiculitis    Thyroid  nodule    removed   Urinary incontinence    Vertigo      Family History  Problem Relation Age of Onset   Colon polyps Maternal Aunt    Diabetes Father    Heart disease Father    Heart disease Mother    Lung disease Mother    COPD Mother    Lung cancer Brother    Cancer Brother        lung   COPD Brother    Stroke Brother  Lung cancer Sister    COPD Sister    Kidney disease Other        niece-mat side   Arthritis Other    Diabetes Son    Arthritis Daughter    Colon cancer Neg Hx      Social History   Occupational History   Occupation: Retired Chief Financial Officer  Tobacco Use   Smoking status: Never   Smokeless tobacco: Never  Vaping Use   Vaping status: Never Used  Substance and Sexual Activity   Alcohol use: No   Drug use: No   Sexual activity: Not Currently    Comment: intercourser age 57, less than 5 secxual partners    Allergies  Allergen Reactions    Atorvastatin  Other (See Comments)    Severe headache   Oxycodone  Itching    hallucinations   Statins    Advair Diskus [Fluticasone -Salmeterol] Other (See Comments)    Mouth sores   Sulfonamide Derivatives Rash     Outpatient Medications Prior to Visit  Medication Sig Dispense Refill   albuterol  (PROVENTIL ) (2.5 MG/3ML) 0.083% nebulizer solution Take 3 mLs (2.5 mg total) by nebulization every 6 (six) hours as needed for wheezing or shortness of breath. 360 mL 3   amLODipine  (NORVASC ) 2.5 MG tablet TAKE 1 TABLET DAILY 90 tablet 3   ARIPiprazole  (ABILIFY ) 2 MG tablet Take 1 tablet (2 mg total) by mouth daily. 90 tablet 1   aspirin  EC 81 MG tablet Take by mouth.     budesonide  (PULMICORT ) 1 MG/2ML nebulizer solution Take 2 mLs (1 mg total) by nebulization in the morning and at bedtime. 360 mL 3   buPROPion  (WELLBUTRIN  XL) 150 MG 24 hr tablet TAKE 1 TABLET DAILY 90 tablet 3   calcium -vitamin D  (OSCAL WITH D) 500-200 MG-UNIT tablet Take 1 tablet by mouth 3 (three) times daily. 90 tablet 6   cephALEXin  (KEFLEX ) 250 MG capsule Take 250 mg by mouth at bedtime.     Cholecalciferol (VITAMIN D3) 20 MCG (800 UNIT) TABS Take 1 tablet by mouth daily. 90 tablet 3   cyanocobalamin  (VITAMIN B12) 1000 MCG/ML injection Inject 1 mL (1,000 mcg total) into the muscle every 30 (thirty) days. INJECT 1000 MCG WEEKLY FOR 4 WEEKS THEN MONTHLY THEREAFTER 3 mL 3   diazepam  (VALIUM ) 5 MG tablet Take 1 tablet (5 mg total) by mouth every 12 (twelve) hours as needed for anxiety. 100 tablet 1   docusate sodium (COLACE) 100 MG capsule Take 100 mg by mouth 2 (two) times daily.     estradiol (ESTRACE) 0.1 MG/GM vaginal cream Place vaginally.     formoterol  (PERFOROMIST ) 20 MCG/2ML nebulizer solution Take 2 mLs (20 mcg total) by nebulization 2 (two) times daily. 120 mL 1   memantine  (NAMENDA ) 5 MG tablet TAKE 1 TABLET TWICE A DAY 180 tablet 3   neomycin -colistin-hydrocortisone-thonzonium (CORTISPORIN -TC) 3.05-18-08-0.5 MG/ML OTIC  suspension Place 3 drops into the right ear 4 (four) times daily. 10 mL 0   olmesartan  (BENICAR ) 40 MG tablet Take 1 tablet (40 mg total) by mouth daily. 90 tablet 3   omega-3 acid ethyl esters (LOVAZA ) 1 g capsule TAKE 2 CAPSULES TWICE A DAY 360 capsule 3   omeprazole  (PRILOSEC) 40 MG capsule Take 1 capsule (40 mg total) by mouth in the morning and at bedtime. 180 capsule 3   ondansetron  (ZOFRAN -ODT) 8 MG disintegrating tablet PLACE 1 TABLET ON TONGUE EVERY 8 HOURS AS NEEDED FOR NAUSEA OR VOMITING 30 tablet 35   polyethylene glycol (  MIRALAX / GLYCOLAX) 17 g packet Take 17 g by mouth daily.     Probiotic Product (PROBIOTIC PO) Take by mouth daily.     RESTASIS 0.05 % ophthalmic emulsion 1 drop 2 (two) times daily.     rOPINIRole  (REQUIP ) 1 MG tablet TAKE 1 TABLET IN THE MORNING AND 2 TABLETS IN THE EVENING 270 tablet 3   sertraline  (ZOLOFT ) 50 MG tablet TAKE 1 TABLET DAILY 90 tablet 3   Spacer/Aero-Holding Chambers DEVI 1 Units by Does not apply route daily. 1 Units 0   SYRINGE-NEEDLE, DISP, 3 ML (B-D 3CC LUER-LOK SYR 25GX1) 25G X 1 3 ML MISC Use weekly x 4 weeks then monthly with B 12 50 each 0   Tiotropium Bromide Monohydrate  (SPIRIVA  RESPIMAT) 2.5 MCG/ACT AERS Inhale 2 Inhalations into the lungs daily at 12 noon. 3 each 3   tocilizumab  (ACTEMRA ) 400 MG/20ML SOLN injection      tocilizumab  4 mg/kg in sodium chloride  0.9 % Inject 280 mg into the vein.     traMADol  (ULTRAM ) 50 MG tablet TAKE ONE TABLET BY MOUTH EVERY 6 HOURS AS NEEDED FOR MODERATE PAIN 40 tablet 0   No facility-administered medications prior to visit.    Review of Systems  Constitutional:  Negative for chills, diaphoresis, fever, malaise/fatigue and weight loss.  HENT:  Negative for congestion.   Respiratory:  Positive for shortness of breath. Negative for cough, hemoptysis, sputum production and wheezing.   Cardiovascular:  Negative for chest pain, palpitations and leg swelling.     Objective:   Vitals:   12/02/23  1304  BP: 130/66  Pulse: 80  SpO2: 95%  Weight: 165 lb 12.8 oz (75.2 kg)  Height: 5' 5 (1.651 m)    SpO2: 95 %  Physical Exam: General: Elderly-appearing, no acute distress HENT: [place], AT Eyes: EOMI, no scleral icterus Respiratory: Clear to auscultation bilaterally.  No crackles, wheezing or rales Cardiovascular: RRR, -M/R/G, no JVD Extremities:-Edema,-tenderness Neuro: AAO x4, CNII-XII grossly intact Psych: Normal mood, normal affect  Data Reviewed:  Imaging: CTA 06/24/22 - No PE. Upper lobe tree in bud nodularity CT Chest 07/10/22 - Mild patchy tree in bud in upper lobes bilaterally with mild cylindrical bronchiolectasis. New 4 mm RUL nodule CT Chest 11/29/22 - Stable findings with mild tree in bud and mild cylindrical bronchiolectasis CTA 06/10/23 - No PE. Multiple tree in bud in bilateral upper lobes  PFT: 11/27/12 FVC 2.20 (88%) FEV1 1.38 (74%) Ratio 63  TLC 82% DLCO 73% Interpretation: Mild obstructive defect with significant bronchodilator response. Mildly reduced DLCO  Labs: CBC    Component Value Date/Time   WBC 20.2 (H) 06/10/2023 1315   RBC 3.68 (L) 06/10/2023 1315   HGB 12.0 06/10/2023 1315   HCT 36.0 06/10/2023 1315   PLT 205 06/10/2023 1315   MCV 97.8 06/10/2023 1315   MCH 32.6 06/10/2023 1315   MCHC 33.3 06/10/2023 1315   RDW 14.1 06/10/2023 1315   LYMPHSABS 1.3 06/10/2023 1315   MONOABS 1.4 (H) 06/10/2023 1315   EOSABS 0.1 06/10/2023 1315   BASOSABS 0.1 06/10/2023 1315   Absolute eos 12/21/21 - 100    06/10/23    RT Note: Patient 's initial oxygen  saturation on room air at rest was 95%. HR 69, RR 17   Patient oxygen  saturation ambulating with the use of a walker while on  room air  = 77%, HR 89, RR 22.  Patient was placed back on 2lpm [place] with a oxygen  saturation =91%., HR 75, RR  18    Will order 2L with activity and sleep on 06/23/23. Patient and family [name]) notified.    Assessment & Plan:   Discussion: 89 year old female never smoker with  asthma, polymyagia rheumatica on Actemra , HTN, HLD, osteoporosis, GERD, fibromyalgia, anxiety/depression who presents for follow-up. Shortness of breath overall improved on current regimen. Prefers inhalers over nebulizers unless she is sick. Discussed clinical course and management of asthma including bronchodilator regimen, preventive care including vaccinations and action plan for exacerbation.    Asthma --CONTINUE Symbicort  160-4.5 mcg TWO puffs in the morning and evening. Rinse mouth out after use    OR --Nebulizer: CONTINUE budesonie nebulizer and formoterol  nebulizer in the morning and evening  --CONTINUE Albuterol  AS NEEDED for shortness of breath or wheezing.   Chronic hypoxemic respiratory failure - suspect secondary to undiagnosed OSA --CONTINUE 2L oxygen  with activity for goal SpO2 >88% --CONTINUE 2L oxygen  with sleep --Encourage more frequent walking at home with walker --Will need ambulatory O2 in March 2026  Abnormal CT -likely related to recent pneumonia. Discussed that this could be atypical infection such as MAC however not a good candidate for diagnostic testing or treatment. No stigmata of ILD Minimal bronchiectasis - may be related to recurrent infections +/- aspiration --Stable CT findings 2025. No indication or plan for bronchoscopy as likely due to aspiration  Health Maintenance Immunization History  Administered Date(s) Administered   Fluad Quad(high Dose 65+) 12/22/2018, 01/17/2020, 01/17/2021, 01/03/2022   Fluad Trivalent(High Dose 65+) 12/27/2022   Fluzone Influenza virus vaccine,trivalent (IIV3), split virus 12/22/2012   INFLUENZA, HIGH DOSE SEASONAL PF 12/30/2013, 12/12/2015, 12/24/2016, 01/01/2018   Influenza Split 12/19/2011, 12/16/2012, 12/30/2013   Influenza Whole 12/17/2011   Influenza-Unspecified 01/17/2020   PFIZER(Purple Top)SARS-COV-2 Vaccination 10/25/2019, 11/15/2019   Pneumococcal Conjugate-13 12/17/2010   Pneumococcal Polysaccharide-23  06/29/2008, 12/30/2013   Tdap 04/11/2015   Zoster Recombinant(Shingrix) 09/09/2017, 01/01/2018   CT Lung Screen - not qualified. Age, insufficient tobacco hx  No orders of the defined types were placed in this encounter.  Meds ordered this encounter  Medications   budesonide -formoterol  (SYMBICORT ) 160-4.5 MCG/ACT inhaler    Sig: Inhale 2 puffs into the lungs 2 (two) times daily.    Dispense:  30.6 g    Refill:  3    Dispense 3 inhalers    Return in about 6 months (around 05/31/2024).  I have spent a total time of 35-minutes on the day of the appointment including chart review, data review, collecting history, coordinating care and discussing medical diagnosis and plan with the patient/family. Past medical history, allergies, medications were reviewed. Pertinent imaging, labs and tests included in this note have been reviewed and interpreted independently by me.  Rosamund Nyland Slater Staff, MD [facility] 12/02/2023 6:41 PM

## 2023-12-09 DIAGNOSIS — M316 Other giant cell arteritis: Secondary | ICD-10-CM | POA: Diagnosis not present

## 2023-12-09 NOTE — Progress Notes (Addendum)
 [facility]  - [facility] [place] [address] [place] [address] Dept: [phone]     Date: 12/09/2023                                                           Ordering Provider:  Aldona Ziolkowska, MD   [name]      Oct 10, 1929           [id number]  Mode of Arrival:  wheelchair Transportation: family  Encounter Diagnosis  Name Primary?  . Giant cell arteritis    (CMD) Yes    Vitals:   12/09/23 1033 12/09/23 1126 12/09/23 1200  BP: (!) 142/55 (!) 136/55 (!) 139/54  BP Location: Right arm    Patient Position: Sitting    Pulse: 74 74 69  Temp: 98 F (36.7 C)    TempSrc: Skin    SpO2: 94% 94% 95%  Weight: 78.2 kg (172 lb 6.4 oz)      Administrations This Visit     tocilizumab  (ACTEMRA ) 280 mg in sodium chloride  0.9 % 100 mL IVPB     Admin Date 12/09/2023 Action New Bag Dose 280 mg Rate 100 mL/hr Route intravenous Documented By [name], RN             Medication  Actemra  Dose: 280 mg Wastage: 0 Medication provided by [facility]   24 gauge angiocatheter  was placed in the left forearm on the 1st attempt. Blood return observed, catheter was flushed with normal saline without difficulty and is absent of redness, swelling or pain; site secured with dressing and taped appropriately for infusion. Access was removed per protocol after completion of infusion.  The catheter tip was intact.  No redness, swelling or pain noted at the insertion site.  Area was covered with gauze and coban.  Patient tolerated the infusion well.   Patient was discharged ambulatory  Labs collected: none Last Quantiferon was drawn 12/06/2022 and the result was Negative  Infusion Nurse Signature:  [name], RN  Supervision: [name], PA  Infusion start time  1058  Infusion Stop time  1200 Electronically signed by: [name], PA-C 12/09/2023 12:55 PM

## 2023-12-25 ENCOUNTER — Institutional Professional Consult (permissible substitution) (INDEPENDENT_AMBULATORY_CARE_PROVIDER_SITE_OTHER): Admitting: Otolaryngology

## 2023-12-30 ENCOUNTER — Inpatient Hospital Stay (HOSPITAL_BASED_OUTPATIENT_CLINIC_OR_DEPARTMENT_OTHER)
Admission: EM | Admit: 2023-12-30 | Discharge: 2024-01-03 | DRG: 315 | Disposition: A | Discharge status: Home Health | Source: Ambulatory Visit | Attending: Internal Medicine | Admitting: Internal Medicine

## 2023-12-30 ENCOUNTER — Emergency Department (HOSPITAL_BASED_OUTPATIENT_CLINIC_OR_DEPARTMENT_OTHER)

## 2023-12-30 ENCOUNTER — Other Ambulatory Visit: Payer: Self-pay

## 2023-12-30 ENCOUNTER — Emergency Department (HOSPITAL_BASED_OUTPATIENT_CLINIC_OR_DEPARTMENT_OTHER): Admitting: Radiology

## 2023-12-30 ENCOUNTER — Encounter (HOSPITAL_BASED_OUTPATIENT_CLINIC_OR_DEPARTMENT_OTHER): Payer: Self-pay | Admitting: *Deleted

## 2023-12-30 ENCOUNTER — Other Ambulatory Visit (HOSPITAL_BASED_OUTPATIENT_CLINIC_OR_DEPARTMENT_OTHER): Payer: Self-pay

## 2023-12-30 DIAGNOSIS — N390 Urinary tract infection, site not specified: Secondary | ICD-10-CM | POA: Diagnosis present

## 2023-12-30 DIAGNOSIS — S32029A Unspecified fracture of second lumbar vertebra, initial encounter for closed fracture: Secondary | ICD-10-CM | POA: Diagnosis present

## 2023-12-30 DIAGNOSIS — I700 Atherosclerosis of aorta: Secondary | ICD-10-CM | POA: Diagnosis not present

## 2023-12-30 DIAGNOSIS — S199XXA Unspecified injury of neck, initial encounter: Secondary | ICD-10-CM | POA: Diagnosis not present

## 2023-12-30 DIAGNOSIS — S32019A Unspecified fracture of first lumbar vertebra, initial encounter for closed fracture: Secondary | ICD-10-CM | POA: Diagnosis present

## 2023-12-30 DIAGNOSIS — M1811 Unilateral primary osteoarthritis of first carpometacarpal joint, right hand: Secondary | ICD-10-CM | POA: Diagnosis not present

## 2023-12-30 DIAGNOSIS — S52501A Unspecified fracture of the lower end of right radius, initial encounter for closed fracture: Secondary | ICD-10-CM | POA: Diagnosis present

## 2023-12-30 DIAGNOSIS — G2581 Restless legs syndrome: Secondary | ICD-10-CM | POA: Diagnosis present

## 2023-12-30 DIAGNOSIS — S3992XA Unspecified injury of lower back, initial encounter: Secondary | ICD-10-CM | POA: Diagnosis not present

## 2023-12-30 DIAGNOSIS — I10 Essential (primary) hypertension: Secondary | ICD-10-CM | POA: Diagnosis not present

## 2023-12-30 DIAGNOSIS — S299XXA Unspecified injury of thorax, initial encounter: Secondary | ICD-10-CM | POA: Diagnosis not present

## 2023-12-30 DIAGNOSIS — I959 Hypotension, unspecified: Secondary | ICD-10-CM | POA: Diagnosis present

## 2023-12-30 DIAGNOSIS — W19XXXA Unspecified fall, initial encounter: Secondary | ICD-10-CM | POA: Diagnosis not present

## 2023-12-30 DIAGNOSIS — Z23 Encounter for immunization: Secondary | ICD-10-CM | POA: Diagnosis not present

## 2023-12-30 DIAGNOSIS — K219 Gastro-esophageal reflux disease without esophagitis: Secondary | ICD-10-CM | POA: Diagnosis present

## 2023-12-30 DIAGNOSIS — E785 Hyperlipidemia, unspecified: Secondary | ICD-10-CM | POA: Diagnosis present

## 2023-12-30 DIAGNOSIS — E890 Postprocedural hypothyroidism: Secondary | ICD-10-CM | POA: Diagnosis present

## 2023-12-30 DIAGNOSIS — W010XXA Fall on same level from slipping, tripping and stumbling without subsequent striking against object, initial encounter: Secondary | ICD-10-CM | POA: Diagnosis present

## 2023-12-30 DIAGNOSIS — J45909 Unspecified asthma, uncomplicated: Secondary | ICD-10-CM | POA: Diagnosis present

## 2023-12-30 DIAGNOSIS — J9611 Chronic respiratory failure with hypoxia: Secondary | ICD-10-CM | POA: Diagnosis present

## 2023-12-30 DIAGNOSIS — N1831 Chronic kidney disease, stage 3a: Secondary | ICD-10-CM | POA: Diagnosis present

## 2023-12-30 DIAGNOSIS — M160 Bilateral primary osteoarthritis of hip: Secondary | ICD-10-CM | POA: Diagnosis not present

## 2023-12-30 DIAGNOSIS — M549 Dorsalgia, unspecified: Secondary | ICD-10-CM | POA: Diagnosis not present

## 2023-12-30 DIAGNOSIS — C184 Malignant neoplasm of transverse colon: Secondary | ICD-10-CM | POA: Diagnosis present

## 2023-12-30 DIAGNOSIS — Z888 Allergy status to other drugs, medicaments and biological substances status: Secondary | ICD-10-CM

## 2023-12-30 DIAGNOSIS — Z923 Personal history of irradiation: Secondary | ICD-10-CM

## 2023-12-30 DIAGNOSIS — M47812 Spondylosis without myelopathy or radiculopathy, cervical region: Secondary | ICD-10-CM | POA: Diagnosis not present

## 2023-12-30 DIAGNOSIS — M797 Fibromyalgia: Secondary | ICD-10-CM | POA: Diagnosis present

## 2023-12-30 DIAGNOSIS — M353 Polymyalgia rheumatica: Secondary | ICD-10-CM | POA: Diagnosis present

## 2023-12-30 DIAGNOSIS — Z7951 Long term (current) use of inhaled steroids: Secondary | ICD-10-CM

## 2023-12-30 DIAGNOSIS — Z860101 Personal history of adenomatous and serrated colon polyps: Secondary | ICD-10-CM

## 2023-12-30 DIAGNOSIS — Z9981 Dependence on supplemental oxygen: Secondary | ICD-10-CM | POA: Diagnosis not present

## 2023-12-30 DIAGNOSIS — S0990XA Unspecified injury of head, initial encounter: Secondary | ICD-10-CM | POA: Diagnosis not present

## 2023-12-30 DIAGNOSIS — F0393 Unspecified dementia, unspecified severity, with mood disturbance: Secondary | ICD-10-CM | POA: Diagnosis present

## 2023-12-30 DIAGNOSIS — M545 Low back pain, unspecified: Secondary | ICD-10-CM | POA: Diagnosis not present

## 2023-12-30 DIAGNOSIS — I447 Left bundle-branch block, unspecified: Secondary | ICD-10-CM | POA: Diagnosis present

## 2023-12-30 DIAGNOSIS — Z801 Family history of malignant neoplasm of trachea, bronchus and lung: Secondary | ICD-10-CM

## 2023-12-30 DIAGNOSIS — Z9221 Personal history of antineoplastic chemotherapy: Secondary | ICD-10-CM

## 2023-12-30 DIAGNOSIS — Z8711 Personal history of peptic ulcer disease: Secondary | ICD-10-CM

## 2023-12-30 DIAGNOSIS — Z833 Family history of diabetes mellitus: Secondary | ICD-10-CM

## 2023-12-30 DIAGNOSIS — Z885 Allergy status to narcotic agent status: Secondary | ICD-10-CM

## 2023-12-30 DIAGNOSIS — M50221 Other cervical disc displacement at C4-C5 level: Secondary | ICD-10-CM | POA: Diagnosis not present

## 2023-12-30 DIAGNOSIS — S3991XA Unspecified injury of abdomen, initial encounter: Secondary | ICD-10-CM | POA: Diagnosis not present

## 2023-12-30 DIAGNOSIS — M48061 Spinal stenosis, lumbar region without neurogenic claudication: Secondary | ICD-10-CM | POA: Diagnosis not present

## 2023-12-30 DIAGNOSIS — R160 Hepatomegaly, not elsewhere classified: Secondary | ICD-10-CM | POA: Diagnosis present

## 2023-12-30 DIAGNOSIS — K769 Liver disease, unspecified: Secondary | ICD-10-CM

## 2023-12-30 DIAGNOSIS — Z8249 Family history of ischemic heart disease and other diseases of the circulatory system: Secondary | ICD-10-CM | POA: Diagnosis not present

## 2023-12-30 DIAGNOSIS — S6991XA Unspecified injury of right wrist, hand and finger(s), initial encounter: Secondary | ICD-10-CM | POA: Diagnosis not present

## 2023-12-30 DIAGNOSIS — M25531 Pain in right wrist: Secondary | ICD-10-CM | POA: Diagnosis not present

## 2023-12-30 DIAGNOSIS — S3993XA Unspecified injury of pelvis, initial encounter: Secondary | ICD-10-CM | POA: Diagnosis not present

## 2023-12-30 DIAGNOSIS — I129 Hypertensive chronic kidney disease with stage 1 through stage 4 chronic kidney disease, or unspecified chronic kidney disease: Secondary | ICD-10-CM | POA: Diagnosis present

## 2023-12-30 DIAGNOSIS — Z87891 Personal history of nicotine dependence: Secondary | ICD-10-CM

## 2023-12-30 DIAGNOSIS — M4802 Spinal stenosis, cervical region: Secondary | ICD-10-CM | POA: Diagnosis not present

## 2023-12-30 DIAGNOSIS — Z4789 Encounter for other orthopedic aftercare: Secondary | ICD-10-CM | POA: Diagnosis not present

## 2023-12-30 DIAGNOSIS — Z043 Encounter for examination and observation following other accident: Secondary | ICD-10-CM | POA: Diagnosis not present

## 2023-12-30 DIAGNOSIS — Z85828 Personal history of other malignant neoplasm of skin: Secondary | ICD-10-CM

## 2023-12-30 DIAGNOSIS — M810 Age-related osteoporosis without current pathological fracture: Secondary | ICD-10-CM | POA: Diagnosis present

## 2023-12-30 DIAGNOSIS — R55 Syncope and collapse: Secondary | ICD-10-CM | POA: Diagnosis present

## 2023-12-30 DIAGNOSIS — K5900 Constipation, unspecified: Secondary | ICD-10-CM | POA: Diagnosis present

## 2023-12-30 LAB — CBC
HCT: 38.3 % (ref 36.0–46.0)
Hemoglobin: 12.7 g/dL (ref 12.0–15.0)
MCH: 31.1 pg (ref 26.0–34.0)
MCHC: 33.2 g/dL (ref 30.0–36.0)
MCV: 93.6 fL (ref 80.0–100.0)
Platelets: 168 K/uL (ref 150–400)
RBC: 4.09 MIL/uL (ref 3.87–5.11)
RDW: 13.1 % (ref 11.5–15.5)
WBC: 15.3 K/uL — ABNORMAL HIGH (ref 4.0–10.5)
nRBC: 0 % (ref 0.0–0.2)

## 2023-12-30 LAB — URINALYSIS, ROUTINE W REFLEX MICROSCOPIC
Bilirubin Urine: NEGATIVE
Glucose, UA: NEGATIVE mg/dL
Hgb urine dipstick: NEGATIVE
Ketones, ur: 15 mg/dL — AB
Nitrite: NEGATIVE
Protein, ur: 30 mg/dL — AB
Specific Gravity, Urine: 1.013 (ref 1.005–1.030)
pH: 6 (ref 5.0–8.0)

## 2023-12-30 LAB — COMPREHENSIVE METABOLIC PANEL WITH GFR
ALT: 24 U/L (ref 0–44)
AST: 36 U/L (ref 15–41)
Albumin: 4 g/dL (ref 3.5–5.0)
Alkaline Phosphatase: 98 U/L (ref 38–126)
Anion gap: 8 (ref 5–15)
BUN: 18 mg/dL (ref 8–23)
CO2: 28 mmol/L (ref 22–32)
Calcium: 9.8 mg/dL (ref 8.9–10.3)
Chloride: 102 mmol/L (ref 98–111)
Creatinine, Ser: 1.1 mg/dL — ABNORMAL HIGH (ref 0.44–1.00)
GFR, Estimated: 47 mL/min — ABNORMAL LOW (ref >60)
Glucose, Bld: 105 mg/dL — ABNORMAL HIGH (ref 70–99)
Potassium: 4.3 mmol/L (ref 3.5–5.1)
Sodium: 138 mmol/L (ref 135–145)
Total Bilirubin: 0.4 mg/dL (ref 0.0–1.2)
Total Protein: 6 g/dL — ABNORMAL LOW (ref 6.5–8.1)

## 2023-12-30 LAB — PROTIME-INR
INR: 1 (ref 0.8–1.2)
Prothrombin Time: 13.5 s (ref 11.4–15.2)

## 2023-12-30 LAB — LACTIC ACID, PLASMA
Lactic Acid, Venous: 1 mmol/L (ref 0.5–1.9)
Lactic Acid, Venous: 1.2 mmol/L (ref 0.5–1.9)

## 2023-12-30 LAB — TROPONIN T, HIGH SENSITIVITY: Troponin T High Sensitivity: 71 ng/L — ABNORMAL HIGH (ref 0–19)

## 2023-12-30 LAB — ETHANOL: Alcohol, Ethyl (B): <15 mg/dL (ref <15)

## 2023-12-30 MED ORDER — NALOXONE HCL 0.4 MG/ML IJ SOLN: 0.4000 mg | INTRAMUSCULAR | Status: DC | PRN

## 2023-12-30 MED ORDER — HYDRALAZINE HCL 20 MG/ML IJ SOLN: 5.0000 mg | Freq: Four times a day (QID) | INTRAMUSCULAR | Status: DC | PRN

## 2023-12-30 MED ORDER — DIAZEPAM 5 MG PO TABS: 2.5000 mg | ORAL_TABLET | Freq: Two times a day (BID) | ORAL | Status: DC | PRN

## 2023-12-30 MED ORDER — BUPROPION HCL ER (XL) 150 MG PO TB24
150.0000 mg | ORAL_TABLET | Freq: Every day | ORAL | Status: DC
  Administered 2023-12-31 – 2024-01-03 (×4): 150 mg via ORAL
  Filled 2023-12-30 (×4): qty 1

## 2023-12-30 MED ORDER — ROPINIROLE HCL 1 MG PO TABS
2.0000 mg | ORAL_TABLET | Freq: Every day | ORAL | Status: DC
  Administered 2023-12-31 – 2024-01-02 (×4): 2 mg via ORAL
  Filled 2023-12-30 (×4): qty 2

## 2023-12-30 MED ORDER — SERTRALINE HCL 50 MG PO TABS
50.0000 mg | ORAL_TABLET | Freq: Every day | ORAL | Status: DC
  Administered 2023-12-31 – 2024-01-03 (×4): 50 mg via ORAL
  Filled 2023-12-30 (×4): qty 1

## 2023-12-30 MED ORDER — SODIUM CHLORIDE 0.9 % IV SOLN
1.0000 g | INTRAVENOUS | Status: DC
  Administered 2023-12-31 – 2024-01-02 (×3): 1 g via INTRAVENOUS
  Filled 2023-12-30 (×3): qty 10

## 2023-12-30 MED ORDER — INFLUENZA VAC SPLIT HIGH-DOSE 0.5 ML IM SUSY
0.5000 mL | PREFILLED_SYRINGE | INTRAMUSCULAR | Status: AC
Start: 2023-12-31 — End: ?
  Administered 2024-01-02: 0.5 mL via INTRAMUSCULAR
  Filled 2023-12-30: qty 0.5

## 2023-12-30 MED ORDER — HYDROCODONE-ACETAMINOPHEN 5-325 MG PO TABS
1.0000 | ORAL_TABLET | Freq: Once | ORAL | Status: AC
  Administered 2023-12-30: 1 via ORAL
  Filled 2023-12-30: qty 1

## 2023-12-30 MED ORDER — SODIUM CHLORIDE 0.9 % IV SOLN
1.0000 g | Freq: Once | INTRAVENOUS | Status: AC
  Administered 2023-12-30: 1 g via INTRAVENOUS
  Filled 2023-12-30: qty 10

## 2023-12-30 MED ORDER — MEMANTINE HCL 10 MG PO TABS
5.0000 mg | ORAL_TABLET | Freq: Two times a day (BID) | ORAL | Status: DC
  Administered 2023-12-31 – 2024-01-03 (×8): 5 mg via ORAL
  Filled 2023-12-30 (×8): qty 1

## 2023-12-30 MED ORDER — ALBUTEROL SULFATE (2.5 MG/3ML) 0.083% IN NEBU: 2.5000 mg | INHALATION_SOLUTION | Freq: Four times a day (QID) | RESPIRATORY_TRACT | Status: DC | PRN

## 2023-12-30 MED ORDER — ACETAMINOPHEN 325 MG PO TABS: 650.0000 mg | ORAL_TABLET | Freq: Four times a day (QID) | ORAL | Status: DC | PRN

## 2023-12-30 MED ORDER — UMECLIDINIUM BROMIDE 62.5 MCG/ACT IN AEPB
1.0000 | INHALATION_SPRAY | Freq: Every day | RESPIRATORY_TRACT | Status: DC
  Filled 2023-12-30: qty 7

## 2023-12-30 MED ORDER — ARIPIPRAZOLE 2 MG PO TABS
2.0000 mg | ORAL_TABLET | Freq: Every day | ORAL | Status: DC
  Administered 2023-12-31 – 2024-01-02 (×3): 2 mg via ORAL
  Filled 2023-12-30 (×4): qty 1

## 2023-12-30 MED ORDER — LACTATED RINGERS IV BOLUS
1000.0000 mL | Freq: Once | INTRAVENOUS | Status: AC
  Administered 2023-12-30: 1000 mL via INTRAVENOUS

## 2023-12-30 MED ORDER — TIOTROPIUM BROMIDE 2.5 MCG/ACT IN AERS: 2.0000 | INHALATION_SPRAY | Freq: Every day | RESPIRATORY_TRACT | Status: DC

## 2023-12-30 MED ORDER — HYDROCODONE-ACETAMINOPHEN 5-325 MG PO TABS
1.0000 | ORAL_TABLET | Freq: Four times a day (QID) | ORAL | 0 refills | Status: DC | PRN
  Filled 2023-12-30: qty 10, 2d supply, fill #0

## 2023-12-30 MED ORDER — PANTOPRAZOLE SODIUM 40 MG PO TBEC
40.0000 mg | DELAYED_RELEASE_TABLET | Freq: Every day | ORAL | Status: DC
  Administered 2023-12-31 – 2024-01-03 (×4): 40 mg via ORAL
  Filled 2023-12-30 (×4): qty 1

## 2023-12-30 MED ORDER — IOHEXOL 300 MG/ML  SOLN
100.0000 mL | Freq: Once | INTRAMUSCULAR | Status: AC | PRN
  Administered 2023-12-30: 100 mL via INTRAVENOUS

## 2023-12-30 MED ORDER — ROPINIROLE HCL 1 MG PO TABS
1.0000 mg | ORAL_TABLET | Freq: Every day | ORAL | Status: DC
  Administered 2023-12-31 – 2024-01-03 (×4): 1 mg via ORAL
  Filled 2023-12-30 (×4): qty 1

## 2023-12-30 MED ORDER — ACETAMINOPHEN 650 MG RE SUPP: 650.0000 mg | Freq: Four times a day (QID) | RECTAL | Status: DC | PRN

## 2023-12-30 MED ORDER — HYDROCODONE-ACETAMINOPHEN 5-325 MG PO TABS
1.0000 | ORAL_TABLET | Freq: Four times a day (QID) | ORAL | Status: DC | PRN
  Administered 2023-12-31 – 2024-01-03 (×8): 1 via ORAL
  Filled 2023-12-30 (×8): qty 1

## 2023-12-30 NOTE — ED Provider Notes (Signed)
  Physical Exam  BP (!) 148/58   Pulse 66   Temp 98.7 F (37.1 C) (Oral)   Resp 20   SpO2 100%   Physical Exam  Procedures  Procedures  ED Course / MDM    Medical Decision Making Amount and/or Complexity of Data Reviewed Labs: ordered. Radiology: ordered.  Risk Prescription drug management. Decision regarding hospitalization.   Received care of patient from Dr. Jerrol.  Please see his note for prior history, physical and care.  Briefly this is a 89 year old female who presented with concern for mechanical fall with right hip pain and back pain.  She had CT head, cervical spine, lumbar spine, and a right wrist x-ray which showed a mildly displaced distal right radial fracture, no other signs of acute fracture or traumatic subluxation, noted to have chronic fractures of L1 and L2 transverse processes.  Prior to her discharge, when she went to stand she became lightheaded and had a near syncopal episode.  Given this, plan to obtain CT chest abdomen pelvis to evaluate for other injuries related to the fall, monitor her hemodynamics, provide IV fluids.  EKG was obtained and evaluated by me and shows no change from previous.  Her blood pressures have improved. CT chest abdomen pelvis obtained showing no posttraumatic sequela in the chest abdomen or pelvis.  She does have a new heterogeneous lesion in the right lobe of the liver measuring 4.9 x 3.1 cm with enhancing septations, which could be concerning for primary neoplasm, metastatic disease or infection.  Circumferential wall thickening of the proximal transverse colon with small regional lymph nodes, malignancy is not excluded.  Chronic tree-in-bud opacities in the upper lobes appear unchanged likely infectious/inflammatory.  She is unable to stand on reevaluation in the ED.  Does have leukocytosis and many bacteria on UA, consider UTI contributing to generalized weakness, fall, degenerative disc disease.  Given inability to stand,  pain, concern for urinary tract infection as well as hepatic abnormality on CT will admit for continued care.       Dreama Longs, MD 12/31/23 1216

## 2023-12-30 NOTE — ED Notes (Signed)
 This Charity fundraiser. [place], RN, and [place], EMT attempted to stand patient up to do ortho statics. Patient unable to stand stating that she is in too much pain. Patient unable to provide specific location or description of pain but does report continued tailbone pain.

## 2023-12-30 NOTE — ED Notes (Signed)
 This RN assisted patient into wheelchair to be discharged. Patient stated Everything is going black. I feel like I am going to pass out. This RN rechecked patient's blood pressure and found it to be 75/36. Dr. Jerrol made aware and came to bedside.

## 2023-12-30 NOTE — Progress Notes (Signed)
 Plan of Care Note for accepted transfer   Patient: [name] MRN: [id number]   DOA: 12/30/2023  Facility requesting transfer: [facility] Requesting Provider: Dr. Dreama Reason for transfer: Near syncope, UTI Facility course:  Patient is a 89 year old female with history of asthma, chronic respiratory failure with hypoxia on 2 L O2 [place], HTN, HLD, depression/anxiety, GCA receiving Actemra  infusions (unclear whether still on chronic prednisone  5 mg daily?) who presented to the ED for evaluation after a fall.  Patient reportedly had a ground-level fall after she lost her balance and fell backwards striking her head.  She landed on her right wrist, this was splinted by EMS.  Labs were notable for WBC 15.3, troponin T 71, UA with small leukocytes and many bacteria.  Blood and urine cultures collected.  Extensive traumatic injury notable for: --Mildly displaced right radial fracture -- New heterogeneous lesion right lobe of the liver measuring 4.9 x 3.1 cm --Chronic right L1 and L2 transverse process fractures  Per documentation, patient was being assisted into a wheelchair to be discharged.  She reported that everything fell black and that she fell and she was going to pass out.  Blood pressure was 75/36.  She was given 1 L LR and IV ceftriaxone .  BP has improved.  She was unable to stand for orthostatic vitals.  She has been unable to ambulate due to generalized weakness and pain.  Admission requested for further evaluation and management.  Plan of care: The patient is accepted for admission to Progressive unit, at [facility] or [facility], first available.  Author: Jorie Blanch, MD 12/30/2023  Check [email or web address] for on-call coverage.  Nursing staff, Please call [facility] Admits & Consults System-Wide number on Amion as soon as patient's arrival, so appropriate admitting provider can evaluate the pt.

## 2023-12-30 NOTE — ED Provider Notes (Addendum)
 [place] EMERGENCY DEPARTMENT AT [facility] Provider Note   CSN: [id number] Arrival date & time: 12/30/23  1217     Patient presents with: Fall and Wrist Pain   [name] is a 89 y.o. female.    Fall  Wrist Pain     89 year old female presenting to the emergency department after a ground-level mechanical fall.  The patient states that she lost her balance and fell backwards striking her head.  She landed on her right wrist as well and has been complaining of wrist pain.  She also complains of lower back pain.  She denies any loss of consciousness.  She is not on anticoagulation.  She takes an 81 mg aspirin .  She arrives to the emergency department GCS 15, ABC intact.  Prior to Admission medications   Medication Sig Start Date End Date Taking? Authorizing Provider  HYDROcodone -acetaminophen  (NORCO/VICODIN) 5-325 MG tablet Take 1-2 tablets by mouth every 6 (six) hours as needed. 12/30/23  Yes Jerrol Agent, MD  albuterol  (PROVENTIL ) (2.5 MG/3ML) 0.083% nebulizer solution Take 3 mLs (2.5 mg total) by nebulization every 6 (six) hours as needed for wheezing or shortness of breath. 06/23/23   Kassie Acquanetta Bradley, MD  amLODipine  (NORVASC ) 2.5 MG tablet TAKE 1 TABLET DAILY 09/01/23   Wendolyn Jenkins Jansky, MD  ARIPiprazole  (ABILIFY ) 2 MG tablet Take 1 tablet (2 mg total) by mouth daily. 11/19/23   Wendolyn Jenkins Jansky, MD  aspirin  EC 81 MG tablet Take by mouth. 11/26/04   [provider]  budesonide  (PULMICORT ) 1 MG/2ML nebulizer solution Take 2 mLs (1 mg total) by nebulization in the morning and at bedtime. 07/25/23   Parrett, Madelin RAMAN, NP  budesonide -formoterol  (SYMBICORT ) 160-4.5 MCG/ACT inhaler Inhale 2 puffs into the lungs 2 (two) times daily. 12/02/23   Kassie Acquanetta Bradley, MD  buPROPion  (WELLBUTRIN  XL) 150 MG 24 hr tablet TAKE 1 TABLET DAILY 07/20/23   Wendolyn Jenkins Jansky, MD  calcium -vitamin D  (OSCAL WITH D) 500-200 MG-UNIT tablet Take 1 tablet by mouth 3 (three) times daily. 01/11/19    Jerri Kay HERO, MD  cephALEXin  (KEFLEX ) 250 MG capsule Take 250 mg by mouth at bedtime. 03/30/21   [provider]  Cholecalciferol (VITAMIN D3) 20 MCG (800 UNIT) TABS Take 1 tablet by mouth daily. 08/19/19   Kuneff, Renee A, DO  cyanocobalamin  (VITAMIN B12) 1000 MCG/ML injection Inject 1 mL (1,000 mcg total) into the muscle every 30 (thirty) days. INJECT 1000 MCG WEEKLY FOR 4 WEEKS THEN MONTHLY THEREAFTER 09/18/23   Wendolyn Jenkins Jansky, MD  diazepam  (VALIUM ) 5 MG tablet Take 1 tablet (5 mg total) by mouth every 12 (twelve) hours as needed for anxiety. 09/30/23   Wendolyn Jenkins Jansky, MD  docusate sodium (COLACE) 100 MG capsule Take 100 mg by mouth 2 (two) times daily.    [provider]  estradiol (ESTRACE) 0.1 MG/GM vaginal cream Place vaginally. 03/30/21   [provider]  formoterol  (PERFOROMIST ) 20 MCG/2ML nebulizer solution Take 2 mLs (20 mcg total) by nebulization 2 (two) times daily. 06/28/23   Wendolyn Jenkins Jansky, MD  memantine  (NAMENDA ) 5 MG tablet TAKE 1 TABLET TWICE A DAY 09/24/23   Wendolyn Jenkins Jansky, MD  neomycin -colistin-hydrocortisone-thonzonium (CORTISPORIN -TC) 3.05-18-08-0.5 MG/ML OTIC suspension Place 3 drops into the right ear 4 (four) times daily. 08/17/20   Kuneff, Renee A, DO  olmesartan  (BENICAR ) 40 MG tablet Take 1 tablet (40 mg total) by mouth daily. 09/30/23   Wendolyn Jenkins Jansky, MD  omega-3 acid ethyl esters (LOVAZA )  1 g capsule TAKE 2 CAPSULES TWICE A DAY 06/23/23   Wendolyn Jenkins Jansky, MD  omeprazole  (PRILOSEC) 40 MG capsule Take 1 capsule (40 mg total) by mouth in the morning and at bedtime. 09/30/23   Wendolyn Jenkins Jansky, MD  ondansetron  (ZOFRAN -ODT) 8 MG disintegrating tablet PLACE 1 TABLET ON TONGUE EVERY 8 HOURS AS NEEDED FOR NAUSEA OR VOMITING 08/28/22   Wendolyn Jenkins Jansky, MD  polyethylene glycol (MIRALAX / GLYCOLAX) 17 g packet Take 17 g by mouth daily.    [provider]  Probiotic Product (PROBIOTIC PO) Take by mouth daily.    [provider]  RESTASIS  0.05 % ophthalmic emulsion 1 drop 2 (two) times daily. 04/05/20   [provider]  rOPINIRole  (REQUIP ) 1 MG tablet TAKE 1 TABLET IN THE MORNING AND 2 TABLETS IN THE EVENING 06/09/23   Wendolyn Jenkins Jansky, MD  sertraline  (ZOLOFT ) 50 MG tablet TAKE 1 TABLET DAILY 07/23/23   Wendolyn Jenkins Jansky, MD  Spacer/Aero-Holding Chambers DEVI 1 Units by Does not apply route daily. 06/27/23   Wendolyn Jenkins Jansky, MD  SYRINGE-NEEDLE, DISP, 3 ML (B-D 3CC LUER-LOK SYR 25GX1) 25G X 1 3 ML MISC Use weekly x 4 weeks then monthly with B 12 04/09/22   Wendolyn Jenkins Jansky, MD  Tiotropium Bromide Monohydrate  (SPIRIVA  RESPIMAT) 2.5 MCG/ACT AERS Inhale 2 Inhalations into the lungs daily at 12 noon. 07/25/23   Parrett, Madelin RAMAN, NP  tocilizumab  (ACTEMRA ) 400 MG/20ML SOLN injection  11/27/17   [provider]  tocilizumab  4 mg/kg in sodium chloride  0.9 % Inject 280 mg into the vein. 12/03/16   [provider]  traMADol  (ULTRAM ) 50 MG tablet TAKE ONE TABLET BY MOUTH EVERY 6 HOURS AS NEEDED FOR MODERATE PAIN 11/12/17   Nitka, Zaki Gertsch E, MD    Allergies: Atorvastatin , Oxycodone , Statins, Advair diskus [fluticasone -salmeterol], and Sulfonamide derivatives    Review of Systems  All other systems reviewed and are negative.   Updated Vital Signs BP (!) 141/100   Pulse 64   Temp 98.7 F (37.1 C) (Oral)   Resp 14   SpO2 90%   Physical Exam Vitals and nursing note reviewed.  Constitutional:      General: She is not in acute distress.    Appearance: She is well-developed.     Comments: GCS 15, ABC intact  HENT:     Head: Normocephalic and atraumatic.  Eyes:     Extraocular Movements: Extraocular movements intact.     Conjunctiva/sclera: Conjunctivae normal.     Pupils: Pupils are equal, round, and reactive to light.  Neck:     Comments: No midline tenderness to palpation of the cervical spine.  Range of motion intact Cardiovascular:     Rate and Rhythm: Normal rate and regular rhythm.     Heart sounds: No  murmur heard. Pulmonary:     Effort: Pulmonary effort is normal. No respiratory distress.     Breath sounds: Normal breath sounds.  Chest:     Comments: Clavicles stable nontender to AP compression.  Chest wall stable and nontender to AP and lateral compression. Abdominal:     Palpations: Abdomen is soft.     Tenderness: There is no abdominal tenderness.     Comments: Pelvis stable to lateral compression  Musculoskeletal:     Cervical back: Neck supple.     Comments: No midline tenderness to palpation of the thoracic  spine. TTP of the lower lumbar spine  Extremities atraumatic with intact range of motion,  mild TTP of the right wrist no deformity  Skin:    General: Skin is warm and dry.  Neurological:     Mental Status: She is alert.     Comments: Cranial nerves II through XII grossly intact.  Moving all 4 extremities spontaneously.  Sensation grossly intact all 4 extremities     (all labs ordered are listed, but only abnormal results are displayed) Labs Reviewed  COMPREHENSIVE METABOLIC PANEL WITH GFR - Abnormal; Notable for the following components:      Result Value   Glucose, Bld 105 (*)    Creatinine, Ser 1.10 (*)    Total Protein 6.0 (*)    GFR, Estimated 47 (*)    All other components within normal limits  CBC - Abnormal; Notable for the following components:   WBC 15.3 (*)    All other components within normal limits  ETHANOL  PROTIME-INR    EKG: None  Radiology: CT HEAD WO CONTRAST Result Date: 12/30/2023 EXAM: CT HEAD WITHOUT CONTRAST 12/30/2023 01:57:54 PM TECHNIQUE: CT of the head was performed without the administration of intravenous contrast. Automated exposure control, iterative reconstruction, and/or weight based adjustment of the mA/kV was utilized to reduce the radiation dose to as low as reasonably achievable. COMPARISON: Head CT 08/03/2021 and MRI 02/25/2020. CLINICAL HISTORY: Head trauma, moderate-severe. Mechanical fall, struck head. No loss of  consciousness (LOC). Takes 81mg  aspirin . FINDINGS: BRAIN AND VENTRICLES: There is no evidence of an acute infarct, intracranial hemorrhage, mass, midline shift, hydrocephalus, or extra-axial fluid collection. Cerebral atrophy is within normal limits for age. Patchy cerebral white matter hypodensities are similar to the prior CT and are nonspecific but compatible with mild to moderate chronic small vessel ischemic disease. Calcified atherosclerosis at the skull base. ORBITS: Bilateral cataract extraction. SINUSES: No acute abnormality. SOFT TISSUES AND SKULL: No acute soft tissue abnormality. No skull fracture. IMPRESSION: 1. No acute intracranial abnormality. 2. Mild to moderate chronic small vessel ischemic disease. Electronically signed by: Dasie Hamburg MD 12/30/2023 02:29 PM EDT RP Workstation: [id number]   CT CERVICAL SPINE WO CONTRAST Result Date: 12/30/2023 CLINICAL DATA:  Polytrauma, blunt Mechanical fall at home.  Head injury. EXAM: CT CERVICAL SPINE WITHOUT CONTRAST TECHNIQUE: Multidetector CT imaging of the cervical spine was performed without intravenous contrast. Multiplanar CT image reconstructions were also generated. RADIATION DOSE REDUCTION: This exam was performed according to the departmental dose-optimization program which includes automated exposure control, adjustment of the mA and/or kV according to patient size and/or use of iterative reconstruction technique. COMPARISON:  Cervical spine radiographs 03/14/2020. FINDINGS: Alignment: Straightening with a minimal degenerative anterolisthesis at C2-3 and minimal retrolisthesis at C4-5. Skull base and vertebrae: No evidence of acute cervical spine fracture or traumatic subluxation. Soft tissues and spinal canal: No prevertebral fluid or swelling. No visible canal hematoma. Disc levels: Multilevel spondylosis with disc space narrowing, uncinate spurring and facet hypertrophy. There is asymmetric facet hypertrophy on the right at C2-3. Up to mild  foraminal narrowing at multiple levels. No large disc herniation or high-grade spinal stenosis identified. Upper chest: Clear lung apices. Other: Bilateral carotid atherosclerosis. Small calcified right cervical lymph nodes. IMPRESSION: 1. No evidence of acute cervical spine fracture, traumatic subluxation or static signs of instability. 2. Multilevel cervical spondylosis as described. Electronically Signed   By: Elsie Perone M.D.   On: 12/30/2023 14:14   CT Lumbar Spine Wo Contrast Result Date: 12/30/2023 CLINICAL DATA:  Back trauma. Mechanical fall prior to admission at home. Low back pain. EXAM: CT LUMBAR  SPINE WITHOUT CONTRAST TECHNIQUE: Multidetector CT imaging of the lumbar spine was performed without intravenous contrast administration. Multiplanar CT image reconstructions were also generated. RADIATION DOSE REDUCTION: This exam was performed according to the departmental dose-optimization program which includes automated exposure control, adjustment of the mA and/or kV according to patient size and/or use of iterative reconstruction technique. COMPARISON:  Lumbar spine radiographs 01/05/2019, lumbar MRI 02/28/2017 and lumbar CT discogram 03/23/2007 FINDINGS: Segmentation: There are 5 lumbar type vertebral bodies. Alignment: Physiologic. Vertebrae: There are stable remote postsurgical changes from previous L3 through S1 PLIF. The hardware appears intact, without loosening. There is solid interbody fusion at each level. There are nonacute fractures involving the right L1 and L2 transverse processes. No evidence of acute fracture or traumatic subluxation. Paraspinal and other soft tissues: No acute paraspinal findings. Aortic and branch vessel atherosclerosis. Fatty atrophy of the erector spinae musculature inferiorly. Disc levels: No significant disc space findings demonstrated within the visualized lower thoracic spine. At L1-2, there is progressive loss of disc height with annular disc bulging,  endplate osteophytes and vacuum phenomenon. Mild bilateral facet hypertrophy. Resulting mild spinal stenosis without significant foraminal narrowing. L2-3: Stable postsurgical changes from previous left laminectomy. Progressive loss of disc height with annular disc bulging, endplate osteophytes and vacuum phenomenon. Mild bilateral facet hypertrophy. No significant spinal stenosis. Mild foraminal narrowing bilaterally. No acute findings are demonstrated at the operative levels status post L3 through S1 fusion. The spinal canal is decompressed. There is mild chronic osseous foraminal narrowing on the left at L4-5. IMPRESSION: 1. No evidence of acute lumbar spine fracture, traumatic subluxation or static signs of instability. Nonacute fractures of the right L1 and L2 transverse processes. 2. Stable remote postsurgical changes from previous L3 through S1 PLIF. 3. 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 No high-grade spinal stenosis or definite nerve root encroachment. 4.  Aortic Atherosclerosis (ICD10-I70.0). Electronically Signed   By: Elsie Perone M.D.   On: 12/30/2023 14:10   DG Chest Port 1 View Result Date: 12/30/2023 CLINICAL DATA:  Status post fall. EXAM: PORTABLE CHEST 1 VIEW COMPARISON:  June 10, 2023 FINDINGS: The cardiac silhouette is mildly enlarged and unchanged in size. There is marked severity calcification of the aortic arch. No acute infiltrate, pleural effusion or pneumothorax is identified. The visualized skeletal structures are unremarkable. IMPRESSION: No active disease. Electronically Signed   By: Suzen Dials M.D.   On: 12/30/2023 13:34   DG Wrist Complete Right Result Date: 12/30/2023 EXAM: 3 or more VIEW(S) XRAY OF THE RIGHT WRIST 12/30/2023 01:04:00 PM COMPARISON: None available. CLINICAL HISTORY: fall, wrist pain and swelling. Pt arrives by [facility] due to mechanical fall pta at home. Pt has swelling and pain to right wrist which was splinted by  paramedics. Pt also has pain to lower back and stuck head. No LOC. No blood thinners, takes 81mg  aspirin  FINDINGS: BONES AND JOINTS: Mildly displaced distal right radial fracture is noted. Severe osteoarthritis is seen involving the first carpometacarpal joint. Calcification of triangular fibrocartilage is noted. No focal osseous lesion. No joint dislocation. SOFT TISSUES: The soft tissues are unremarkable. IMPRESSION: 1. Mildly displaced distal right radial fracture. 2. Severe osteoarthritis of the first carpometacarpal joint. 3. Calcification of the triangular fibrocartilage complex (chondrocalcinosis). Electronically signed by: Lynwood Seip MD 12/30/2023 01:32 PM EDT RP Workstation: [id number]   DG Pelvis Portable Result Date: 12/30/2023 CLINICAL DATA:  Status post fall. EXAM: PORTABLE PELVIS 1-2 VIEWS COMPARISON:  January 05, 2019 FINDINGS: There is no evidence  of pelvic fracture or diastasis. No pelvic bone lesions are seen. Degenerative changes are seen involving both hips in the form of joint space narrowing and acetabular sclerosis. Postoperative changes are noted within the lumbar spine. IMPRESSION: No acute osseous abnormality. Electronically Signed   By: Suzen Dials M.D.   On: 12/30/2023 13:32     .Splint Application  Date/Time: 12/30/2023 2:22 PM  Performed by: Wolford, Cathlyn LABOR, EMT Authorized by: Jerrol Agent, MD   Consent:    Consent obtained:  Verbal   Consent given by:  Patient Universal protocol:    Patient identity confirmed:  Verbally with patient Pre-procedure details:    Distal neurologic exam:  Normal   Distal perfusion: distal pulses strong   Procedure details:    Location:  Wrist   Wrist location:  R wrist   Cast type:  Short arm   Splint type:  Volar short arm Post-procedure details:    Distal neurologic exam:  Normal   Distal perfusion: distal pulses strong     Procedure completion:  Tolerated    Medications Ordered in the ED   HYDROcodone -acetaminophen  (NORCO/VICODIN) 5-325 MG per tablet 1 tablet (1 tablet Oral Given 12/30/23 1313)                                    Medical Decision Making Amount and/or Complexity of Data Reviewed Labs: ordered. Radiology: ordered.  Risk Prescription drug management.    89 year old female presenting to the emergency department after a ground-level mechanical fall.  The patient states that she lost her balance and fell backwards striking her head.  She landed on her right wrist as well and has been complaining of wrist pain.  She also complains of lower back pain.  She denies any loss of consciousness.  She is not on anticoagulation.  She takes an 81 mg aspirin .  She arrives to the emergency department GCS 15, ABC intact.   On arrival, vitals stable. Currently, she is awake, alert, and protecting her own airway and is hemodynamically stable.  Trauma imaging revealed (full reports in EMR): Portable CXR:  No evidence of pneumothorax or tracheal deviation Portable Pelvis:  No evidence of acute hip fracture or malalignment  XR Right Wrist: IMPRESSION:  1. Mildly displaced distal right radial fracture.  2. Severe osteoarthritis of the first carpometacarpal joint.  3. Calcification of the triangular fibrocartilage complex (chondrocalcinosis).    CT Head: IMPRESSION:  1. No acute intracranial abnormality.  2. Mild to moderate chronic small vessel ischemic disease.   CT Cervical Spine: IMPRESSION:  1. No evidence of acute cervical spine fracture, traumatic  subluxation or static signs of instability.  2. Multilevel cervical spondylosis as described.   CT Lumbar Spine: IMPRESSION:  1. No evidence of acute lumbar spine fracture, traumatic subluxation  or static signs of instability. Nonacute fractures of the right L1  and L2 transverse processes.  2. Stable remote postsurgical changes from previous L3 through S1  PLIF.  3. Progressive adjacent segment disease at  L1-2 and L2-3 as  described. No high-grade spinal stenosis or definite nerve root  encroachment.  4.  Aortic Atherosclerosis (ICD10-I70.0).    There were no significant lab abnormalities other than mild nonspecific leukocytosis of 15.3.  The patient received Norco while in the ED. The patient's wrist was splinted in a volar wrist splint.  Referral was placed for outpatient follow-up with orthopedics.   At discharge,  the patient on standing became lightheaded, near syncopal, and hypotensive. Due to this, she was bedded, administered an IVF bolus., EKG obtained. CT C/A/P obtained to further evaluate. Signout given to Dr. Dreama at 1500 to follow-up results.      Final diagnoses:  Fall, initial encounter  Closed fracture of distal end of right radius, unspecified fracture morphology, initial encounter    ED Discharge Orders          Ordered    HYDROcodone -acetaminophen  (NORCO/VICODIN) 5-325 MG tablet  Every 6 hours PRN        12/30/23 1445    Ambulatory referral to Hand Surgery        12/30/23 1446               Jerrol Agent, MD 12/30/23 1446    Jerrol Agent, MD 12/30/23 [phone]

## 2023-12-30 NOTE — ED Notes (Signed)
 This RN and a tech assisted patient into stretcher and reattached her to the monitor. 1L LR hung at this time per verbal order from Dr. Jerrol.

## 2023-12-30 NOTE — Discharge Instructions (Addendum)
 Please follow-up with hand surgery for further management of your distal radius fracture.  You have been placed in a splint for comfort.  Norco has been provided for pain control.  The remainder of your trauma imaging was overall reassuring.  You did have evidence of old fracture seen on your CT of the lumbar spine but no acute fractures were noted.

## 2023-12-30 NOTE — ED Triage Notes (Signed)
 Pt arrives by [facility] due to mechanical fall pta at home. Pt has swelling and pain to right wrist which was splinted by paramedics.  Pt also has pain to lower back and stuck head.  No LOC.  No blood thinners, takes 81mg  aspirin .

## 2023-12-30 NOTE — H&P (Signed)
 History and Physical    [name] FMW:[id number] DOB: 08-Sep-1929 DOA: 12/30/2023  PCP: Wendolyn Jenkins Jansky, MD  Patient coming from: [facility] ED  Chief Complaint: Fall  HPI: [name] is a 89 y.o. female with medical history significant of early dementia, asthma, chronic hypoxemic respiratory failure on 2 L home oxygen , hypertension, hyperlipidemia, CKD stage II-IIIa, anxiety, depression, GCA, polymyalgia rheumatica on Actemra  infusions, RLS, GERD, vitamin B12 deficiency presented to the ED for evaluation after a ground-level fall.  Patient lost her balance and fell backwards striking her head.  She landed on her right wrist and complained of pain.  Vital signs on arrival: Temperature 98.7 F, pulse 78, respiratory rate 12, blood pressure 165/97, SpO2 95% on room air.  Labs notable for WBC count 15.3, glucose 105, creatinine 1.1 (stable), ethanol level <15, troponin 71, lactic acid 1.0.  UA with small leukocytes, 6-10 WBCs, and many bacteria.  Blood and urine cultures collected.  Patient had extensive traumatic imaging done including chest x-ray, pelvic x-ray, x-ray of right wrist, and CT head/C-spine/L-spine/chest/abdomen/pelvis.  Imaging notable for mildly displaced distal right radial fracture, nonacute right L1 and L2 transverse process fractures, and new heterogeneous lesion in the right lobe of the liver measuring 4.9 x 3.1 cm with enhancing septations concerning for primary neoplasm, metastatic disease, or infection.  Also showing circumferential wall thickening of the proximal transverse colon with small regional lymph nodes concerning for possible malignancy and no evidence of bowel obstruction seen.  EKG showing sinus or ectopic atrial rhythm, LBBB, QTc 497, and no acute ischemic changes in comparison to previous EKGs.  Patient's right wrist was splinted in a volar wrist splint.  Initial plan was to discharge the patient from the ED and as she was being assisted into a wheelchair to be  discharged, she reportedly had an episode of near syncope and was found hypotensive with SBP in the 60-70s.  Patient was given 1 L LR, ceftriaxone , and Norco.  Blood pressure improved after IV fluids.  She was unable to stand for orthostatic vitals and unable ambulate due to generalized weakness and pain.  History provided by the patient and her daughter at bedside.  Patient states she had bent down to remove some items from the refrigerator and as soon as she stood up, she fell backwards striking her head on the floor but did not lose consciousness.  Since after the fall, she is having pain in her right wrist and also her tailbone.  She denies fevers, chills, nausea, vomiting, or abdominal pain.  No weight loss reported.  Per daughter, patient is on monthly Actemra  infusions due to history of polymyalgia rheumatica and last infusion was on 12/09/2023.  Patient is reporting dysuria and reports getting up multiple times at night to urinate.  She reports history of chronic shortness of breath related to asthma for which she uses 2 L home oxygen  at night and during the day as needed.  Her chronic dyspnea is not any worse from her baseline.  Denies chest pain.  Daughter states patient has history of chronic right foot drop which makes it difficult for her to ambulate.  Review of Systems:  Review of Systems  All other systems reviewed and are negative.   Past Medical History:  Diagnosis Date   Abdominal bloating    Abnormal LFTs (liver function tests)    Ankle fracture, bimalleolar, closed, right, initial encounter    Anxiety disorder    Arthritis    Asthma    Bimalleolar  ankle fracture, right, closed, initial encounter 01/06/2019   Depression    Diverticulosis    Edema    Fatty liver    Fibromyalgia    GERD (gastroesophageal reflux disease)    Giant cell arteritis (HCC)    No biopsy secondary to steroid use   Hx of adenomatous colonic polyps    Hyperlipidemia    Hypertension    IBS (irritable  bowel syndrome)    Lumbar radiculitis    Osteoporosis 09/2017   T score -2.6 left femoral neck   Peptic ulcer disease    Skin cancer    shoulder, leg, right eye (? squamous by resection)   Thoracic radiculitis    Thyroid  nodule    removed   Urinary incontinence    Vertigo     Past Surgical History:  Procedure Laterality Date   ABDOMINAL HYSTERECTOMY     BSO   BACK SURGERY  2005   lower back   EYE SURGERY Right 2006   LIVER BIOPSY     fatty liver   NASAL SEPTUM SURGERY     ORIF ANKLE FRACTURE Right 01/11/2019   Procedure: OPEN REDUCTION INTERNAL FIXATION (ORIF) RIGHT BIMALLEOLAR ANKLE FRACTURE;  Surgeon: Jerri Kay HERO, MD;  Location: [facility];  Service: Orthopedics;  Laterality: Right;   right knee surgery  2006   arthroscopy   right leg skin cancer surgery  2014   right shoulder skin cancer excision  2012   SKIN CANCER EXCISION     THYROIDECTOMY       reports that she has never smoked. She has never used smokeless tobacco. She reports that she does not drink alcohol and does not use drugs.  Allergies  Allergen Reactions   Atorvastatin  Other (See Comments)    Severe headache   Oxycodone  Itching    hallucinations   Statins    Advair Diskus [Fluticasone -Salmeterol] Other (See Comments)    Mouth sores   Sulfonamide Derivatives Rash    Family History  Problem Relation Age of Onset   Colon polyps Maternal Aunt    Diabetes Father    Heart disease Father    Heart disease Mother    Lung disease Mother    COPD Mother    Lung cancer Brother    Cancer Brother        lung   COPD Brother    Stroke Brother    Lung cancer Sister    COPD Sister    Kidney disease Other        niece-mat side   Arthritis Other    Diabetes Son    Arthritis Daughter    Colon cancer Neg Hx     Prior to Admission medications   Medication Sig Start Date End Date Taking? Authorizing Provider  HYDROcodone -acetaminophen  (NORCO/VICODIN) 5-325 MG tablet Take 1-2 tablets  by mouth every 6 (six) hours as needed. 12/30/23  Yes Jerrol Agent, MD  albuterol  (PROVENTIL ) (2.5 MG/3ML) 0.083% nebulizer solution Take 3 mLs (2.5 mg total) by nebulization every 6 (six) hours as needed for wheezing or shortness of breath. 06/23/23   Kassie Acquanetta Bradley, MD  amLODipine  (NORVASC ) 2.5 MG tablet TAKE 1 TABLET DAILY 09/01/23   Wendolyn Jenkins Jansky, MD  ARIPiprazole  (ABILIFY ) 2 MG tablet Take 1 tablet (2 mg total) by mouth daily. 11/19/23   Wendolyn Jenkins Jansky, MD  aspirin  EC 81 MG tablet Take by mouth. 11/26/04   [provider]  budesonide  (PULMICORT ) 1 MG/2ML nebulizer solution Take 2 mLs (  1 mg total) by nebulization in the morning and at bedtime. 07/25/23   Parrett, Madelin RAMAN, NP  budesonide -formoterol  (SYMBICORT ) 160-4.5 MCG/ACT inhaler Inhale 2 puffs into the lungs 2 (two) times daily. 12/02/23   Kassie Acquanetta Bradley, MD  buPROPion  (WELLBUTRIN  XL) 150 MG 24 hr tablet TAKE 1 TABLET DAILY 07/20/23   Wendolyn Jenkins Jansky, MD  calcium -vitamin D  (OSCAL WITH D) 500-200 MG-UNIT tablet Take 1 tablet by mouth 3 (three) times daily. 01/11/19   Jerri Kay HERO, MD  cephALEXin  (KEFLEX ) 250 MG capsule Take 250 mg by mouth at bedtime. 03/30/21   [provider]  Cholecalciferol (VITAMIN D3) 20 MCG (800 UNIT) TABS Take 1 tablet by mouth daily. 08/19/19   Kuneff, Renee A, DO  cyanocobalamin  (VITAMIN B12) 1000 MCG/ML injection Inject 1 mL (1,000 mcg total) into the muscle every 30 (thirty) days. INJECT 1000 MCG WEEKLY FOR 4 WEEKS THEN MONTHLY THEREAFTER 09/18/23   Wendolyn Jenkins Jansky, MD  diazepam  (VALIUM ) 5 MG tablet Take 1 tablet (5 mg total) by mouth every 12 (twelve) hours as needed for anxiety. 09/30/23   Wendolyn Jenkins Jansky, MD  docusate sodium (COLACE) 100 MG capsule Take 100 mg by mouth 2 (two) times daily.    [provider]  estradiol (ESTRACE) 0.1 MG/GM vaginal cream Place vaginally. 03/30/21   [provider]  formoterol  (PERFOROMIST ) 20 MCG/2ML nebulizer solution Take 2 mLs (20 mcg total) by  nebulization 2 (two) times daily. 06/28/23   Wendolyn Jenkins Jansky, MD  memantine  (NAMENDA ) 5 MG tablet TAKE 1 TABLET TWICE A DAY 09/24/23   Wendolyn Jenkins Jansky, MD  neomycin -colistin-hydrocortisone-thonzonium (CORTISPORIN -TC) 3.05-18-08-0.5 MG/ML OTIC suspension Place 3 drops into the right ear 4 (four) times daily. 08/17/20   Kuneff, Renee A, DO  olmesartan  (BENICAR ) 40 MG tablet Take 1 tablet (40 mg total) by mouth daily. 09/30/23   Wendolyn Jenkins Jansky, MD  omega-3 acid ethyl esters (LOVAZA ) 1 g capsule TAKE 2 CAPSULES TWICE A DAY 06/23/23   Wendolyn Jenkins Jansky, MD  omeprazole  (PRILOSEC) 40 MG capsule Take 1 capsule (40 mg total) by mouth in the morning and at bedtime. 09/30/23   Wendolyn Jenkins Jansky, MD  ondansetron  (ZOFRAN -ODT) 8 MG disintegrating tablet PLACE 1 TABLET ON TONGUE EVERY 8 HOURS AS NEEDED FOR NAUSEA OR VOMITING 08/28/22   Wendolyn Jenkins Jansky, MD  polyethylene glycol (MIRALAX / GLYCOLAX) 17 g packet Take 17 g by mouth daily.    [provider]  Probiotic Product (PROBIOTIC PO) Take by mouth daily.    [provider]  RESTASIS 0.05 % ophthalmic emulsion 1 drop 2 (two) times daily. 04/05/20   [provider]  rOPINIRole  (REQUIP ) 1 MG tablet TAKE 1 TABLET IN THE MORNING AND 2 TABLETS IN THE EVENING 06/09/23   Wendolyn Jenkins Jansky, MD  sertraline  (ZOLOFT ) 50 MG tablet TAKE 1 TABLET DAILY 07/23/23   Wendolyn Jenkins Jansky, MD  Spacer/Aero-Holding Chambers DEVI 1 Units by Does not apply route daily. 06/27/23   Wendolyn Jenkins Jansky, MD  SYRINGE-NEEDLE, DISP, 3 ML (B-D 3CC LUER-LOK SYR 25GX1) 25G X 1 3 ML MISC Use weekly x 4 weeks then monthly with B 12 04/09/22   Wendolyn Jenkins Jansky, MD  Tiotropium Bromide Monohydrate  (SPIRIVA  RESPIMAT) 2.5 MCG/ACT AERS Inhale 2 Inhalations into the lungs daily at 12 noon. 07/25/23   Parrett, Madelin RAMAN, NP  tocilizumab  (ACTEMRA ) 400 MG/20ML SOLN injection  11/27/17   [provider]  tocilizumab  4 mg/kg in sodium chloride  0.9 % Inject 280 mg  into the vein. 12/03/16   [provider]  traMADol  (ULTRAM ) 50 MG tablet TAKE ONE TABLET BY MOUTH EVERY 6 HOURS AS NEEDED FOR MODERATE PAIN 11/12/17   Lucilla Lynwood BRAVO, MD    Physical Exam: Vitals:   12/30/23 1945 12/30/23 1954 12/30/23 2100 12/30/23 2105  BP: (!) 133/57   (!) 173/60  Pulse: 65   67  Resp: 18   18  Temp:  97.8 F (36.6 C)  99 F (37.2 C)  TempSrc:  Oral  Oral  SpO2: 98%   96%  Weight:   76.3 kg   Height:   5' 1 (1.549 m)     Physical Exam Vitals reviewed.  Constitutional:      General: She is not in acute distress. HENT:     Head: Normocephalic and atraumatic.  Eyes:     Extraocular Movements: Extraocular movements intact.  Cardiovascular:     Rate and Rhythm: Normal rate and regular rhythm.     Heart sounds: Normal heart sounds.  Pulmonary:     Effort: Pulmonary effort is normal. No respiratory distress.     Breath sounds: No wheezing.  Abdominal:     General: Bowel sounds are normal.     Palpations: Abdomen is soft.     Tenderness: There is no abdominal tenderness. There is no guarding.  Musculoskeletal:     Cervical back: Normal range of motion.     Right lower leg: No edema.     Left lower leg: No edema.  Skin:    General: Skin is warm and dry.  Neurological:     General: No focal deficit present.     Mental Status: She is alert and oriented to person, place, and time.     Labs on Admission: I have personally reviewed following labs and imaging studies  CBC: Recent Labs  Lab 12/30/23 1308  WBC 15.3*  HGB 12.7  HCT 38.3  MCV 93.6  PLT 168   Basic Metabolic Panel: Recent Labs  Lab 12/30/23 1308  NA 138  K 4.3  CL 102  CO2 28  GLUCOSE 105*  BUN 18  CREATININE 1.10*  CALCIUM  9.8   GFR: Estimated Creatinine Clearance: 30.5 mL/min (A) (by C-G formula based on SCr of 1.1 mg/dL (H)). Liver Function Tests: Recent Labs  Lab 12/30/23 1308  AST 36  ALT 24  ALKPHOS 98  BILITOT 0.4  PROT 6.0*  ALBUMIN 4.0   No results for input(s): LIPASE, AMYLASE  in the last 168 hours. No results for input(s): AMMONIA in the last 168 hours. Coagulation Profile: Recent Labs  Lab 12/30/23 1308  INR 1.0   Cardiac Enzymes: No results for input(s): CKTOTAL, CKMB, CKMBINDEX, TROPONINI in the last 168 hours. BNP (last 3 results) No results for input(s): PROBNP in the last 8760 hours. HbA1C: No results for input(s): HGBA1C in the last 72 hours. CBG: No results for input(s): GLUCAP in the last 168 hours. Lipid Profile: No results for input(s): CHOL, HDL, LDLCALC, TRIG, CHOLHDL, LDLDIRECT in the last 72 hours. Thyroid  Function Tests: No results for input(s): TSH, T4TOTAL, FREET4, T3FREE, THYROIDAB in the last 72 hours. Anemia Panel: No results for input(s): VITAMINB12, FOLATE, FERRITIN, TIBC, IRON, RETICCTPCT in the last 72 hours. Urine analysis:    Component Value Date/Time   COLORURINE YELLOW 12/30/2023 1626   APPEARANCEUR CLEAR 12/30/2023 1626   LABSPEC 1.013 12/30/2023 1626   PHURINE 6.0 12/30/2023 1626   GLUCOSEU NEGATIVE 12/30/2023 1626   GLUCOSEU NEGATIVE  01/11/2008 1526   HGBUR NEGATIVE 12/30/2023 1626   BILIRUBINUR NEGATIVE 12/30/2023 1626   BILIRUBINUR neg 08/29/2021 1301   KETONESUR 15 (A) 12/30/2023 1626   PROTEINUR 30 (A) 12/30/2023 1626   UROBILINOGEN 1.0 08/29/2021 1301   UROBILINOGEN 0.2 mg/dL 89/73/7990 8473   NITRITE NEGATIVE 12/30/2023 1626   LEUKOCYTESUR SMALL (A) 12/30/2023 1626    Radiological Exams on Admission: CT CHEST ABDOMEN PELVIS W CONTRAST Result Date: 12/30/2023 EXAM: CT CHEST, ABDOMEN AND PELVIS WITH CONTRAST 12/30/2023 05:03:32 PM TECHNIQUE: CT of the chest, abdomen and pelvis was performed with the administration of 100 mL of iohexol  (OMNIPAQUE ) 300 MG/ML solution. Multiplanar reformatted images are provided for review. Automated exposure control, iterative reconstruction, and/or weight based adjustment of the mA/kV was utilized to reduce the radiation dose  to as low as reasonably achievable. COMPARISON: CT angiogram chest 325 2000 and 25. CT abdomen and pelvis 106 2012. CLINICAL HISTORY: Polytrauma, blunt. Triage note: Pt arrives by [facility] due to mechanical fall pta at home. Pt has swelling and pain to right wrist which was splinted by paramedics. Pt also has pain to lower back and stuck head. No LOC. No blood thinners, takes 81mg  aspirin . FINDINGS: CHEST: MEDIASTINUM AND LYMPH NODES: Atherosclerotic calcifications at the aorta and coronary arteries. The heart is mildly enlarged. There is no mediastinal hematoma or pneumomediastinum. The central airways are clear. No mediastinal, hilar or axillary lymphadenopathy. LUNGS AND PLEURA: Minimal atelectasis in the lingula. Minimal tree-in-bud opacities in the bilateral upper lobes similar to prior study. A few scattered 2 mm nodular densities, unchanged. No focal consolidation or pulmonary edema. No pleural effusion or pneumothorax. ABDOMEN AND PELVIS: LIVER: There is a new heterogeneous hypodense lesion in the right lobe of the liver measuring 4.9x3.1 cm with enhancing septations. Additional scattered subcentimeter rounded hypodensities are similar to prior, likely cysts or hemangiomas. Calcified granulomas in the liver. No liver laceration or subcapsular fluid collection. GALLBLADDER AND BILE DUCTS: Gallbladder is unremarkable. No biliary ductal dilatation. SPLEEN: No acute abnormality. PANCREAS: No acute abnormality. ADRENAL GLANDS: No acute abnormality. KIDNEYS, URETERS AND BLADDER: No stones in the kidneys or ureters. No hydronephrosis. No perinephric or periureteral stranding. Urinary bladder is unremarkable. GI AND BOWEL: Stomach demonstrates no acute abnormality. Circumferential wall thickening of the proximal transverse colon without surrounding inflammation. There are small surrounding lymph nodes. Appendix appears normal. Sigmoid colon diverticulosis. There is no bowel obstruction. REPRODUCTIVE ORGANS: Uterus is  surgically absent. PERITONEUM AND RETROPERITONEUM: No ascites. No free air. VASCULATURE: Aorta is normal in caliber. Atherosclerotic calcifications throughout the aorta. ABDOMINAL AND PELVIS LYMPH NODES: No lymphadenopathy. BONES AND SOFT TISSUES: Stable chronic compression deformity of T5. Healing right L1 and L2 transverse process fractures which are new from prior. L3-S1 posterior fusion hardware present. No acute fractures are identified. Chronic right hip joint effusion appears unchanged. No focal soft tissue abnormality. IMPRESSION: 1. No acute posttraumatic sequelae in the chest, abdomen, or pelvis. 2. New heterogeneous lesion in the right lobe of the liver measuring 4.9 x 3.1 cm with enhancing septations. Findings are concerning for a primary neoplasm, metastatic disease, or infection in the appropriate clinical setting. 3. Circumferential wall thickening of the proximal transverse colon with small regional lymph nodes. No bowel obstruction. Malignancy is not excluded. Recommend follow-up direct visualization. 4. Chronic tree-in-bud opacities in the upper lobes appear unchanged, likely infectious/inflammatory. Electronically signed by: Greig Pique MD 12/30/2023 05:25 PM EDT RP Workstation: [id number]   CT HEAD WO CONTRAST Result Date: 12/30/2023 EXAM: CT HEAD WITHOUT CONTRAST  12/30/2023 01:57:54 PM TECHNIQUE: CT of the head was performed without the administration of intravenous contrast. Automated exposure control, iterative reconstruction, and/or weight based adjustment of the mA/kV was utilized to reduce the radiation dose to as low as reasonably achievable. COMPARISON: Head CT 08/03/2021 and MRI 02/25/2020. CLINICAL HISTORY: Head trauma, moderate-severe. Mechanical fall, struck head. No loss of consciousness (LOC). Takes 81mg  aspirin . FINDINGS: BRAIN AND VENTRICLES: There is no evidence of an acute infarct, intracranial hemorrhage, mass, midline shift, hydrocephalus, or extra-axial fluid collection.  Cerebral atrophy is within normal limits for age. Patchy cerebral white matter hypodensities are similar to the prior CT and are nonspecific but compatible with mild to moderate chronic small vessel ischemic disease. Calcified atherosclerosis at the skull base. ORBITS: Bilateral cataract extraction. SINUSES: No acute abnormality. SOFT TISSUES AND SKULL: No acute soft tissue abnormality. No skull fracture. IMPRESSION: 1. No acute intracranial abnormality. 2. Mild to moderate chronic small vessel ischemic disease. Electronically signed by: Dasie Hamburg MD 12/30/2023 02:29 PM EDT RP Workstation: [id number]   CT CERVICAL SPINE WO CONTRAST Result Date: 12/30/2023 CLINICAL DATA:  Polytrauma, blunt Mechanical fall at home.  Head injury. EXAM: CT CERVICAL SPINE WITHOUT CONTRAST TECHNIQUE: Multidetector CT imaging of the cervical spine was performed without intravenous contrast. Multiplanar CT image reconstructions were also generated. RADIATION DOSE REDUCTION: This exam was performed according to the departmental dose-optimization program which includes automated exposure control, adjustment of the mA and/or kV according to patient size and/or use of iterative reconstruction technique. COMPARISON:  Cervical spine radiographs 03/14/2020. FINDINGS: Alignment: Straightening with a minimal degenerative anterolisthesis at C2-3 and minimal retrolisthesis at C4-5. Skull base and vertebrae: No evidence of acute cervical spine fracture or traumatic subluxation. Soft tissues and spinal canal: No prevertebral fluid or swelling. No visible canal hematoma. Disc levels: Multilevel spondylosis with disc space narrowing, uncinate spurring and facet hypertrophy. There is asymmetric facet hypertrophy on the right at C2-3. Up to mild foraminal narrowing at multiple levels. No large disc herniation or high-grade spinal stenosis identified. Upper chest: Clear lung apices. Other: Bilateral carotid atherosclerosis. Small calcified right  cervical lymph nodes. IMPRESSION: 1. No evidence of acute cervical spine fracture, traumatic subluxation or static signs of instability. 2. Multilevel cervical spondylosis as described. Electronically Signed   By: Elsie Perone M.D.   On: 12/30/2023 14:14   CT Lumbar Spine Wo Contrast Result Date: 12/30/2023 CLINICAL DATA:  Back trauma. Mechanical fall prior to admission at home. Low back pain. EXAM: CT LUMBAR SPINE WITHOUT CONTRAST TECHNIQUE: Multidetector CT imaging of the lumbar spine was performed without intravenous contrast administration. Multiplanar CT image reconstructions were also generated. RADIATION DOSE REDUCTION: This exam was performed according to the departmental dose-optimization program which includes automated exposure control, adjustment of the mA and/or kV according to patient size and/or use of iterative reconstruction technique. COMPARISON:  Lumbar spine radiographs 01/05/2019, lumbar MRI 02/28/2017 and lumbar CT discogram 03/23/2007 FINDINGS: Segmentation: There are 5 lumbar type vertebral bodies. Alignment: Physiologic. Vertebrae: There are stable remote postsurgical changes from previous L3 through S1 PLIF. The hardware appears intact, without loosening. There is solid interbody fusion at each level. There are nonacute fractures involving the right L1 and L2 transverse processes. No evidence of acute fracture or traumatic subluxation. Paraspinal and other soft tissues: No acute paraspinal findings. Aortic and branch vessel atherosclerosis. Fatty atrophy of the erector spinae musculature inferiorly. Disc levels: No significant disc space findings demonstrated within the visualized lower thoracic spine. At L1-2, there is progressive loss of disc  height with annular disc bulging, endplate osteophytes and vacuum phenomenon. Mild bilateral facet hypertrophy. Resulting mild spinal stenosis without significant foraminal narrowing. L2-3: Stable postsurgical changes from previous left  laminectomy. Progressive loss of disc height with annular disc bulging, endplate osteophytes and vacuum phenomenon. Mild bilateral facet hypertrophy. No significant spinal stenosis. Mild foraminal narrowing bilaterally. No acute findings are demonstrated at the operative levels status post L3 through S1 fusion. The spinal canal is decompressed. There is mild chronic osseous foraminal narrowing on the left at L4-5. IMPRESSION: 1. No evidence of acute lumbar spine fracture, traumatic subluxation or static signs of instability. Nonacute fractures of the right L1 and L2 transverse processes. 2. Stable remote postsurgical changes from previous L3 through S1 PLIF. 3. 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 No high-grade spinal stenosis or definite nerve root encroachment. 4.  Aortic Atherosclerosis (ICD10-I70.0). Electronically Signed   By: Elsie Perone M.D.   On: 12/30/2023 14:10   DG Chest Port 1 View Result Date: 12/30/2023 CLINICAL DATA:  Status post fall. EXAM: PORTABLE CHEST 1 VIEW COMPARISON:  June 10, 2023 FINDINGS: The cardiac silhouette is mildly enlarged and unchanged in size. There is marked severity calcification of the aortic arch. No acute infiltrate, pleural effusion or pneumothorax is identified. The visualized skeletal structures are unremarkable. IMPRESSION: No active disease. Electronically Signed   By: Suzen Dials M.D.   On: 12/30/2023 13:34   DG Wrist Complete Right Result Date: 12/30/2023 EXAM: 3 or more VIEW(S) XRAY OF THE RIGHT WRIST 12/30/2023 01:04:00 PM COMPARISON: None available. CLINICAL HISTORY: fall, wrist pain and swelling. Pt arrives by [facility] due to mechanical fall pta at home. Pt has swelling and pain to right wrist which was splinted by paramedics. Pt also has pain to lower back and stuck head. No LOC. No blood thinners, takes 81mg  aspirin  FINDINGS: BONES AND JOINTS: Mildly displaced distal right radial fracture is noted. Severe  osteoarthritis is seen involving the first carpometacarpal joint. Calcification of triangular fibrocartilage is noted. No focal osseous lesion. No joint dislocation. SOFT TISSUES: The soft tissues are unremarkable. IMPRESSION: 1. Mildly displaced distal right radial fracture. 2. Severe osteoarthritis of the first carpometacarpal joint. 3. Calcification of the triangular fibrocartilage complex (chondrocalcinosis). Electronically signed by: Lynwood Seip MD 12/30/2023 01:32 PM EDT RP Workstation: [id number]   DG Pelvis Portable Result Date: 12/30/2023 CLINICAL DATA:  Status post fall. EXAM: PORTABLE PELVIS 1-2 VIEWS COMPARISON:  January 05, 2019 FINDINGS: There is no evidence of pelvic fracture or diastasis. No pelvic bone lesions are seen. Degenerative changes are seen involving both hips in the form of joint space narrowing and acetabular sclerosis. Postoperative changes are noted within the lumbar spine. IMPRESSION: No acute osseous abnormality. Electronically Signed   By: Suzen Dials M.D.   On: 12/30/2023 13:32    Assessment and Plan  Hypotension and near syncope Blood pressure improved after 1 L IV fluids.  No fever, tachycardia, or lactic acidosis.  Troponin 71 but EKG without acute ischemic changes and patient is not endorsing chest pain.  PE less likely given no tachycardia, no chest pain, and no increasing oxygen  requirement from baseline.  Trend troponin.  Check orthostatics in the morning.  PT/OT, fall precautions.  Mildly displaced distal right radius fracture Secondary to a mechanical fall.  Placed in a volar wrist splint in the ED.  Continue pain management.  Will need evaluation by orthopedics.  New liver lesion seen on imaging CT noted new heterogeneous lesion in the right  lobe of the liver measuring 4.9 x 3.1 cm with enhancing septations concerning for primary neoplasm, metastatic disease, or infection.  Also showing circumferential wall thickening of the proximal transverse colon  with small regional lymph nodes concerning for possible malignancy but no evidence of bowel obstruction seen.  Patient is chronically immunosuppressed and may need aspiration of liver lesion for further evaluation.  LFTs normal.  Check alpha-fetoprotein and hepatitis panel ordered.  May need colonoscopy for evaluation of transverse colon.  Further input needed from oncology and GI given patient's age.  Possible UTI Patient is endorsing urinary symptoms.  WBC count 15.3, no signs of sepsis.  UA with small leukocytes, 6-10 WBCs, and many bacteria.  Continue ceftriaxone  for now.  Follow-up urine and blood cultures.  QT prolongation QTc 497 on EKG.  Potassium is within normal range.  Check magnesium  level and replace if low.  Continuing home mood disorder medications for now but avoid any other drugs with potential risk of QT prolongation.  Asthma Chronic hypoxemic respiratory failure on 2 L home oxygen  Stable, no wheezing.  No increasing oxygen  requirement from baseline.  Continue Spiriva , albuterol  PRN.  Hypertension Avoiding antihypertensives at this time given hypotension and near syncope in the ED requiring IV fluid resuscitation.  IV hydralazine PRN SBP >180.  CKD stage II-IIIa Creatinine stable.  Early dementia Mood disorder Continue memantine , aripiprazole , bupropion , sertraline , and diazepam  PRN.  RLS Continue ropinirole .  Polymyalgia rheumatica On monthly Actemra  infusions, last dose 12/09/2023 per daughter.  GERD Continue PPI.  Home medications initiated per history reported by the patient's daughter.  Awaiting pharmacy med rec for verification.   DVT prophylaxis: SCDs Code Status: Full Code (discussed with the patient and her daughter) Family Communication: Daughter updated. Level of care: Progressive Care Unit Admission status: It is my clinical opinion that referral for OBSERVATION is reasonable and necessary in this patient based on the above information provided. The  aforementioned taken together are felt to place the patient at high risk for further clinical deterioration. However, it is anticipated that the patient may be medically stable for discharge from the hospital within 24 to 48 hours.  Editha Ram MD [facility]  If 7PM-7AM, please contact night-coverage [email or web address]  12/30/2023, 9:27 PM

## 2023-12-31 ENCOUNTER — Ambulatory Visit: Admitting: Family Medicine

## 2023-12-31 DIAGNOSIS — K769 Liver disease, unspecified: Secondary | ICD-10-CM | POA: Diagnosis not present

## 2023-12-31 DIAGNOSIS — W19XXXA Unspecified fall, initial encounter: Secondary | ICD-10-CM | POA: Diagnosis not present

## 2023-12-31 DIAGNOSIS — R55 Syncope and collapse: Secondary | ICD-10-CM | POA: Diagnosis not present

## 2023-12-31 LAB — CBC
HCT: 37.2 % (ref 36.0–46.0)
Hemoglobin: 12 g/dL (ref 12.0–15.0)
MCH: 31.6 pg (ref 26.0–34.0)
MCHC: 32.3 g/dL (ref 30.0–36.0)
MCV: 97.9 fL (ref 80.0–100.0)
Platelets: 153 K/uL (ref 150–400)
RBC: 3.8 MIL/uL — ABNORMAL LOW (ref 3.87–5.11)
RDW: 13.2 % (ref 11.5–15.5)
WBC: 17.6 K/uL — ABNORMAL HIGH (ref 4.0–10.5)
nRBC: 0 % (ref 0.0–0.2)

## 2023-12-31 LAB — HEPATITIS PANEL, ACUTE
HCV Ab: NONREACTIVE
Hep A IgM: NONREACTIVE
Hep B C IgM: NONREACTIVE
Hepatitis B Surface Ag: NONREACTIVE

## 2023-12-31 LAB — MAGNESIUM: Magnesium: 2.2 mg/dL (ref 1.7–2.4)

## 2023-12-31 LAB — TROPONIN T, HIGH SENSITIVITY: Troponin T High Sensitivity: 63 ng/L — ABNORMAL HIGH (ref 0–19)

## 2023-12-31 MED ORDER — ORAL CARE MOUTH RINSE: 15.0000 mL | OROMUCOSAL | Status: DC | PRN

## 2023-12-31 MED ORDER — POLYETHYLENE GLYCOL 3350 17 G PO PACK
17.0000 g | PACK | Freq: Every day | ORAL | Status: DC
  Administered 2023-12-31 – 2024-01-01 (×2): 17 g via ORAL
  Filled 2023-12-31 (×2): qty 1

## 2023-12-31 MED ORDER — BUDESONIDE 0.25 MG/2ML IN SUSP
0.2500 mg | Freq: Two times a day (BID) | RESPIRATORY_TRACT | Status: DC
  Administered 2023-12-31 – 2024-01-03 (×7): 0.25 mg via RESPIRATORY_TRACT
  Filled 2023-12-31 (×7): qty 2

## 2023-12-31 MED ORDER — CYANOCOBALAMIN 1000 MCG/ML IJ SOLN
1000.0000 ug | Freq: Once | INTRAMUSCULAR | Status: AC
  Administered 2023-12-31: 1000 ug via INTRAMUSCULAR
  Filled 2023-12-31: qty 1

## 2023-12-31 MED ORDER — LACTATED RINGERS IV SOLN: INTRAVENOUS | Status: DC

## 2023-12-31 MED ORDER — MENTHOL 3 MG MT LOZG: 1.0000 | LOZENGE | OROMUCOSAL | Status: DC | PRN

## 2023-12-31 MED ORDER — ARFORMOTEROL TARTRATE 15 MCG/2ML IN NEBU
15.0000 ug | INHALATION_SOLUTION | Freq: Two times a day (BID) | RESPIRATORY_TRACT | Status: DC
  Administered 2023-12-31 – 2024-01-03 (×7): 15 ug via RESPIRATORY_TRACT
  Filled 2023-12-31 (×7): qty 2

## 2023-12-31 MED ORDER — IPRATROPIUM-ALBUTEROL 0.5-2.5 (3) MG/3ML IN SOLN
3.0000 mL | Freq: Four times a day (QID) | RESPIRATORY_TRACT | Status: DC
  Administered 2023-12-31 – 2024-01-02 (×11): 3 mL via RESPIRATORY_TRACT
  Filled 2023-12-31 (×11): qty 3

## 2023-12-31 NOTE — Evaluation (Signed)
 Physical Therapy Evaluation Patient Details Name: [name] MRN: [id number] DOB: 1929/06/13 Today's Date: 12/31/2023  History of Present Illness  [name] is a 89 yr old female brought to the hospital after a fall. She was found to have concern for hypotension/near syncope, and a mildly displaced R distal radius fracture. PMH: dementia, asthma, chronic hypoxemic respiratory failure on 2L O2, HTN, HLD, CKD III, anxiety, depression, polymyalgia rheumatica, RLS, GERD, vitamin B12 deficiency, GCA  Clinical Impression    Pt admitted with above diagnosis.  Pt currently with functional limitations due to the deficits listed below (see PT Problem List). Pt in bed when PT arrived. Daughter present. Pt indicated difficulty moving since fall, back pain and R UE pain. Pt is a poor historian with hx of dementia and daughter able to provide insight to PLOF and home setting as well as circumstances of fall. Pt exhibits difficulty maintaining R distal UE NWB with functional mobility tasks. Pt required max A x for supine <> sit, CGA for sitting balance EOB, PT unable to assist pt to standing with max A, pt required max A to scoot laterally to R in bed toward EOB. Pt left in bed all needs in place, daughter present, CP on R wrist and nurse made aware of pt pain report. MD entered room during PT eval. PT eval conducted prior to orthopedic consultation with recommendation for R distal UE NWB and pt ok to WB through R elbow, pt may benefit from trial with R PFRW. Pending pt progress with therapy, pt may benefit from continued inpatient follow up therapy, <3 hours/day vs HH services. Pt will benefit from acute skilled PT to increase their independence and safety with mobility to allow discharge.   Unable to obtain full set of orthostatics: Bp supine 133/52 (74 PR) 91% on 2 L/min  Bp seated EOB 138/87 (81 PR) 93% on 2 L/min no reports of dizziness/light headedness       If plan is discharge home, recommend the  following: Two people to help with walking and/or transfers;A lot of help with bathing/dressing/bathroom;Assistance with cooking/housework;Direct supervision/assist for medications management;Direct supervision/assist for financial management;Assist for transportation;Help with stairs or ramp for entrance;Supervision due to cognitive status   Can travel by private vehicle   No    Equipment Recommendations Other (comment) (TBD, R PFRW)  Recommendations for Other Services       Functional Status Assessment Patient has had a recent decline in their functional status and demonstrates the ability to make significant improvements in function in a reasonable and predictable amount of time.     Precautions / Restrictions Precautions Precautions: Fall;Other (comment) (monitor vitals, 2 L/min at baseline, chronic R foot drop) Restrictions Weight Bearing Restrictions Per Provider Order: Yes RUE Weight Bearing Per Provider Order: Non weight bearing      Mobility  Bed Mobility Overal bed mobility: Needs Assistance Bed Mobility: Supine to Sit, Sit to Supine     Supine to sit: Max assist, HOB elevated Sit to supine: Max assist   General bed mobility comments: max assist and strong cues for R UE NWB status    Transfers Overall transfer level: Needs assistance                 General transfer comment: PT unable to assist pt to upright standing with max A x 1 pt limited due to pain and fatigue    Ambulation/Gait               General Gait  Details: NT  Stairs            Wheelchair Mobility     Tilt Bed    Modified Rankin (Stroke Patients Only)       Balance Overall balance assessment: Needs assistance, History of Falls Sitting-balance support: Feet supported Sitting balance-Leahy Scale: Fair                                       Pertinent Vitals/Pain Pain Assessment Pain Assessment: Faces Faces Pain Scale: Hurts whole lot Pain  Location: back and R UE Pain Descriptors / Indicators: Aching, Burning, Constant, Discomfort, Grimacing, Guarding Pain Intervention(s): Limited activity within patient's tolerance, Monitored during session, Patient requesting pain meds-RN notified, Repositioned, Ice applied    Home Living Family/patient expects to be discharged to:: Private residence Living Arrangements: Children (Son) Available Help at Discharge: Family Type of Home: House Home Access: Ramped entrance Entrance Stairs-Rails: None Entrance Stairs-Number of Steps: small threshold and a ramp to access home   Home Layout: One level Home Equipment: Rollator (4 wheels);BSC/3in1;Shower seat;Wheelchair - manual Additional Comments: Pt's daughter was present and assisted with providing info regarding the pt's prior level of functioning and living situation.    Prior Function Prior Level of Function : Needs assist             Mobility Comments: She used a rollator for household ambulation and manual wheelchair in the community. Per the pt's daughter, the pt is unable to ambulate longer distances. ADLs Comments: The pt required assistance for lower body dressing and bathing, her family managed the cooking and cleaning, she performed toileting without the need for assistance, and she does not drive. She used 2L O2 as needed during the day & most all the time at night.     Extremity/Trunk Assessment   Upper Extremity Assessment Upper Extremity Assessment: Right hand dominant;RUE deficits/detail;LUE deficits/detail RUE Deficits / Details: Elbow and shoulder AROM WFL. Wrist ROM not assessed, due to current fracture and applied dressing/splint LUE Deficits / Details: AROM WFL. Functional grip strength    Lower Extremity Assessment Lower Extremity Assessment: RLE deficits/detail;LLE deficits/detail;Generalized weakness RLE Deficits / Details: AROM WFL LLE Deficits / Details: AROM WFL.    Cervical / Trunk Assessment Cervical  / Trunk Assessment: Back Surgery  Communication   Communication Communication: No apparent difficulties Factors Affecting Communication: Hearing impaired    Cognition Arousal: Alert Behavior During Therapy: WFL for tasks assessed/performed   PT - Cognitive impairments: History of cognitive impairments                       PT - Cognition Comments: pt having difficulty with recall for R distal UE NWB Following commands: Intact       Cueing       General Comments      Exercises     Assessment/Plan    PT Assessment Patient needs continued PT services  PT Problem List Decreased strength;Decreased range of motion;Decreased activity tolerance;Decreased balance;Decreased mobility;Decreased coordination;Decreased cognition;Decreased safety awareness;Decreased knowledge of precautions;Pain       PT Treatment Interventions DME instruction;Gait training;Functional mobility training;Therapeutic activities;Stair training;Therapeutic exercise;Balance training;Neuromuscular re-education;Patient/family education;Modalities    PT Goals (Current goals can be found in the Care Plan section)  Acute Rehab PT Goals Patient Stated Goal: to move better PT Goal Formulation: With patient Time For Goal Achievement: 01/14/24 Potential to Achieve Goals: Good  Frequency Min 2X/week     Co-evaluation               AM-PAC PT 6 Clicks Mobility  Outcome Measure Help needed turning from your back to your side while in a flat bed without using bedrails?: A Lot Help needed moving from lying on your back to sitting on the side of a flat bed without using bedrails?: A Lot Help needed moving to and from a bed to a chair (including a wheelchair)?: Total Help needed standing up from a chair using your arms (e.g., wheelchair or bedside chair)?: Total Help needed to walk in hospital room?: Total Help needed climbing 3-5 steps with a railing? : Total 6 Click Score: 8    End of  Session Equipment Utilized During Treatment: Gait belt Activity Tolerance: Patient limited by pain;Patient limited by fatigue Patient left: in bed;with call bell/phone within reach;with bed alarm set;with family/visitor present Nurse Communication: Mobility status;Need for lift equipment;Patient requests pain meds;Weight bearing status PT Visit Diagnosis: Unsteadiness on feet (R26.81);Other abnormalities of gait and mobility (R26.89);Muscle weakness (generalized) (M62.81);History of falling (Z91.81);Difficulty in walking, not elsewhere classified (R26.2);Pain Pain - Right/Left: Right Pain - part of body: Arm;Hand (back)    Time: 8850-8772 PT Time Calculation (min) (ACUTE ONLY): 38 min   Charges:   PT Evaluation $PT Eval Low Complexity: 1 Low PT Treatments $Therapeutic Activity: 23-37 mins PT General Charges $$ ACUTE PT VISIT: 1 Visit         [place], PT Acute Rehab   [place] VEAR Drone 12/31/2023, 4:24 PM

## 2023-12-31 NOTE — Progress Notes (Signed)
 PROGRESS NOTE    [name]  FMW:[id number] DOB: July 10, 1929 DOA: 12/30/2023 PCP: Wendolyn Jenkins Jansky, MD   Brief Narrative: -year-old with past medical dementia, asthma, chronic hypoxic respite failure on hypertension, CKD stage IIIa, depression, GCA, polymyalgia rheumatica on Actemra  infusion, RLS, GERD, B12 deficiency presented for evaluation to the ED after a ground-level fall.  Patient lost her balance and fell backward striking her head.  She landed on her right wrist and complaining of pain.  Imaging notable mildly displaced distal right radial fracture, nonacute right L1-L2 transverse process fracture and medial heterogeneous lesion in the right lobe of the liver measuring 4.9 x 3.1 cm, CT) showed wall thickening of the right transverse colon with a small regional lymph nodes concerning for malignancy.  Initial plan was to discharge patient but as she has been rather patient episode of near syncope and was found to be hypotensive with systolic blood pressure in the 70s.  She received IV fluids IV antibiotics.   Assessment & Plan:   Principal Problem:   Hypotension Active Problems:   Near syncope   Distal radius fracture, right   Liver lesion   UTI (urinary tract infection)  1-Hypotension, near syncope: -Plan to hold BP medications.  -IV fluids -Check Orthostatic.   Mild right radius fracture: L1 and L2 transverse process fractures,  -Wrist splint placed in the ED -pain management  -Ortho consulted  Liver mass: Circumferential wall thickening of the proximal transverse colon  -Oncology consulted. Will follow recommendation for Biopsy  -Hepatitis panel negative,  -Alfa feto protein pending.   UTI, -Report Dysuria, UA 6-10 WBC -Follow urine culture.  -Continue with IV ceftriaxone .  -Follow up Blood culture Leukocytosis, follow trend.   Prolonged QT, -Mg normal.  -Monitor.    Asthma Chronic hypoxic respiratory failure, -she was having wheezing and mild increase  work of breathing. Nebulizer order and schedule. After nebulizer, breathing has improved.   Hypertension -Hold Norvasc . Monitor BP.   CKD 3A -Monitor renal function.   Early dementia: -Continue with Memantine , aripiprazole , bupropion , sertraline  and valium  PRN  RLS: -On Ropinirole .   Polymyalgia rheumatica: -on Monthly Actemra  last dose 12/09/2023        Estimated body mass index is 31.78 kg/m as calculated from the following:   Height as of this encounter: 5' 1 (1.549 m).   Weight as of this encounter: 76.3 kg.   DVT prophylaxis: SCD Code Status: Full code Family Communication:Daughter at bedside Disposition Plan:  Status is: Observation The patient remains OBS appropriate and will d/c before 2 midnights.    Consultants:  Oncology   Procedures:  none  Antimicrobials:    Subjective: She report wheezing, mild dyspnea. Tachypnea.  She is trying not to move right hand.   Objective: Vitals:   12/30/23 2105 12/30/23 2339 12/31/23 0117 12/31/23 0506  BP: (!) 173/60  (!) 159/64 (!) 174/55  Pulse: 67  68 82  Resp: 18  16 18   Temp: 99 F (37.2 C)  97.8 F (36.6 C) 98.2 F (36.8 C)  TempSrc: Oral  Oral Oral  SpO2: 96% 97% 97% 92%  Weight:      Height:        Intake/Output Summary (Last 24 hours) at 12/31/2023 0646 Last data filed at 12/31/2023 0507 Gross per 24 hour  Intake --  Output 200 ml  Net -200 ml   Filed Weights   12/30/23 2100  Weight: 76.3 kg    Examination:  General exam: Appears calm and comfortable  Respiratory  system: Clear to auscultation. Respiratory effort normal. Cardiovascular system: S1 & S2 heard, RRR. No JVD, murmurs, rubs, gallops or clicks. No pedal edema. Gastrointestinal system: Abdomen is nondistended, soft and nontender. No organomegaly or masses felt. Normal bowel sounds heard. Central nervous system: Alert and oriented. No focal neurological deficits. Extremities: Symmetric 5 x 5 power. Right hand on splint.      Data Reviewed: I have personally reviewed following labs and imaging studies  CBC: Recent Labs  Lab 12/30/23 1308 12/31/23 0004  WBC 15.3* 17.6*  HGB 12.7 12.0  HCT 38.3 37.2  MCV 93.6 97.9  PLT 168 153   Basic Metabolic Panel: Recent Labs  Lab 12/30/23 1308 12/31/23 0004  NA 138  --   K 4.3  --   CL 102  --   CO2 28  --   GLUCOSE 105*  --   BUN 18  --   CREATININE 1.10*  --   CALCIUM  9.8  --   MG  --  2.2   GFR: Estimated Creatinine Clearance: 30.5 mL/min (A) (by C-G formula based on SCr of 1.1 mg/dL (H)). Liver Function Tests: Recent Labs  Lab 12/30/23 1308  AST 36  ALT 24  ALKPHOS 98  BILITOT 0.4  PROT 6.0*  ALBUMIN 4.0   No results for input(s): LIPASE, AMYLASE in the last 168 hours. No results for input(s): AMMONIA in the last 168 hours. Coagulation Profile: Recent Labs  Lab 12/30/23 1308  INR 1.0   Cardiac Enzymes: No results for input(s): CKTOTAL, CKMB, CKMBINDEX, TROPONINI in the last 168 hours. BNP (last 3 results) No results for input(s): PROBNP in the last 8760 hours. HbA1C: No results for input(s): HGBA1C in the last 72 hours. CBG: No results for input(s): GLUCAP in the last 168 hours. Lipid Profile: No results for input(s): CHOL, HDL, LDLCALC, TRIG, CHOLHDL, LDLDIRECT in the last 72 hours. Thyroid  Function Tests: No results for input(s): TSH, T4TOTAL, FREET4, T3FREE, THYROIDAB in the last 72 hours. Anemia Panel: No results for input(s): VITAMINB12, FOLATE, FERRITIN, TIBC, IRON, RETICCTPCT in the last 72 hours. Sepsis Labs: Recent Labs  Lab 12/30/23 1824 12/30/23 2107  LATICACIDVEN 1.0 1.2    Recent Results (from the past 240 hours)  Blood culture (routine x 2)     Status: None (Preliminary result)   Collection Time: 12/30/23  6:10 PM   Specimen: BLOOD  Result Value Ref Range Status   Specimen Description   Final    BLOOD RIGHT ANTECUBITAL Performed at [facility], [address] [place], [place] [address]    Special Requests   Final    Blood Culture adequate volume BOTTLES DRAWN AEROBIC AND ANAEROBIC Performed at [facility], [address] [place], [place] [address]    Culture   Final    NO GROWTH < 12 HOURS Performed at [facility], [address] [place], [place] [address]    Report Status PENDING  Incomplete  Blood culture (routine x 2)     Status: None (Preliminary result)   Collection Time: 12/30/23  6:15 PM   Specimen: BLOOD  Result Value Ref Range Status   Specimen Description   Final    BLOOD LEFT ANTECUBITAL Performed at [facility], [address] [place], [place] [address]    Special Requests   Final    Blood Culture adequate volume BOTTLES DRAWN AEROBIC AND ANAEROBIC Performed at [facility], [address] [place], [place] [address]  Culture   Final    NO GROWTH < 12 HOURS Performed at [facility], [address], [place], [place] [address]    Report Status PENDING  Incomplete         Radiology Studies: CT CHEST ABDOMEN PELVIS W CONTRAST Result Date: 12/30/2023 EXAM: CT CHEST, ABDOMEN AND PELVIS WITH CONTRAST 12/30/2023 05:03:32 PM TECHNIQUE: CT of the chest, abdomen and pelvis was performed with the administration of 100 mL of iohexol  (OMNIPAQUE ) 300 MG/ML solution. Multiplanar reformatted images are provided for review. Automated exposure control, iterative reconstruction, and/or weight based adjustment of the mA/kV was utilized to reduce the radiation dose to as low as reasonably achievable. COMPARISON: CT angiogram chest 325 2000 and 25. CT abdomen and pelvis 106 2012. CLINICAL HISTORY: Polytrauma, blunt. Triage note: Pt arrives by [facility] due to mechanical fall pta at home. Pt has swelling and pain to right wrist which was splinted by paramedics. Pt also has pain to lower back and stuck head. No LOC. No blood  thinners, takes 81mg  aspirin . FINDINGS: CHEST: MEDIASTINUM AND LYMPH NODES: Atherosclerotic calcifications at the aorta and coronary arteries. The heart is mildly enlarged. There is no mediastinal hematoma or pneumomediastinum. The central airways are clear. No mediastinal, hilar or axillary lymphadenopathy. LUNGS AND PLEURA: Minimal atelectasis in the lingula. Minimal tree-in-bud opacities in the bilateral upper lobes similar to prior study. A few scattered 2 mm nodular densities, unchanged. No focal consolidation or pulmonary edema. No pleural effusion or pneumothorax. ABDOMEN AND PELVIS: LIVER: There is a new heterogeneous hypodense lesion in the right lobe of the liver measuring 4.9x3.1 cm with enhancing septations. Additional scattered subcentimeter rounded hypodensities are similar to prior, likely cysts or hemangiomas. Calcified granulomas in the liver. No liver laceration or subcapsular fluid collection. GALLBLADDER AND BILE DUCTS: Gallbladder is unremarkable. No biliary ductal dilatation. SPLEEN: No acute abnormality. PANCREAS: No acute abnormality. ADRENAL GLANDS: No acute abnormality. KIDNEYS, URETERS AND BLADDER: No stones in the kidneys or ureters. No hydronephrosis. No perinephric or periureteral stranding. Urinary bladder is unremarkable. GI AND BOWEL: Stomach demonstrates no acute abnormality. Circumferential wall thickening of the proximal transverse colon without surrounding inflammation. There are small surrounding lymph nodes. Appendix appears normal. Sigmoid colon diverticulosis. There is no bowel obstruction. REPRODUCTIVE ORGANS: Uterus is surgically absent. PERITONEUM AND RETROPERITONEUM: No ascites. No free air. VASCULATURE: Aorta is normal in caliber. Atherosclerotic calcifications throughout the aorta. ABDOMINAL AND PELVIS LYMPH NODES: No lymphadenopathy. BONES AND SOFT TISSUES: Stable chronic compression deformity of T5. Healing right L1 and L2 transverse process fractures which are new  from prior. L3-S1 posterior fusion hardware present. No acute fractures are identified. Chronic right hip joint effusion appears unchanged. No focal soft tissue abnormality. IMPRESSION: 1. No acute posttraumatic sequelae in the chest, abdomen, or pelvis. 2. New heterogeneous lesion in the right lobe of the liver measuring 4.9 x 3.1 cm with enhancing septations. Findings are concerning for a primary neoplasm, metastatic disease, or infection in the appropriate clinical setting. 3. Circumferential wall thickening of the proximal transverse colon with small regional lymph nodes. No bowel obstruction. Malignancy is not excluded. Recommend follow-up direct visualization. 4. Chronic tree-in-bud opacities in the upper lobes appear unchanged, likely infectious/inflammatory. Electronically signed by: Greig Pique MD 12/30/2023 05:25 PM EDT RP Workstation: [id number]   CT HEAD WO CONTRAST Result Date: 12/30/2023 EXAM: CT HEAD WITHOUT CONTRAST 12/30/2023 01:57:54 PM TECHNIQUE: CT of the head was performed without the administration of intravenous contrast. Automated exposure control, iterative reconstruction, and/or weight based  adjustment of the mA/kV was utilized to reduce the radiation dose to as low as reasonably achievable. COMPARISON: Head CT 08/03/2021 and MRI 02/25/2020. CLINICAL HISTORY: Head trauma, moderate-severe. Mechanical fall, struck head. No loss of consciousness (LOC). Takes 81mg  aspirin . FINDINGS: BRAIN AND VENTRICLES: There is no evidence of an acute infarct, intracranial hemorrhage, mass, midline shift, hydrocephalus, or extra-axial fluid collection. Cerebral atrophy is within normal limits for age. Patchy cerebral white matter hypodensities are similar to the prior CT and are nonspecific but compatible with mild to moderate chronic small vessel ischemic disease. Calcified atherosclerosis at the skull base. ORBITS: Bilateral cataract extraction. SINUSES: No acute abnormality. SOFT TISSUES AND SKULL: No  acute soft tissue abnormality. No skull fracture. IMPRESSION: 1. No acute intracranial abnormality. 2. Mild to moderate chronic small vessel ischemic disease. Electronically signed by: Dasie Hamburg MD 12/30/2023 02:29 PM EDT RP Workstation: [id number]   CT CERVICAL SPINE WO CONTRAST Result Date: 12/30/2023 CLINICAL DATA:  Polytrauma, blunt Mechanical fall at home.  Head injury. EXAM: CT CERVICAL SPINE WITHOUT CONTRAST TECHNIQUE: Multidetector CT imaging of the cervical spine was performed without intravenous contrast. Multiplanar CT image reconstructions were also generated. RADIATION DOSE REDUCTION: This exam was performed according to the departmental dose-optimization program which includes automated exposure control, adjustment of the mA and/or kV according to patient size and/or use of iterative reconstruction technique. COMPARISON:  Cervical spine radiographs 03/14/2020. FINDINGS: Alignment: Straightening with a minimal degenerative anterolisthesis at C2-3 and minimal retrolisthesis at C4-5. Skull base and vertebrae: No evidence of acute cervical spine fracture or traumatic subluxation. Soft tissues and spinal canal: No prevertebral fluid or swelling. No visible canal hematoma. Disc levels: Multilevel spondylosis with disc space narrowing, uncinate spurring and facet hypertrophy. There is asymmetric facet hypertrophy on the right at C2-3. Up to mild foraminal narrowing at multiple levels. No large disc herniation or high-grade spinal stenosis identified. Upper chest: Clear lung apices. Other: Bilateral carotid atherosclerosis. Small calcified right cervical lymph nodes. IMPRESSION: 1. No evidence of acute cervical spine fracture, traumatic subluxation or static signs of instability. 2. Multilevel cervical spondylosis as described. Electronically Signed   By: Elsie Perone M.D.   On: 12/30/2023 14:14   CT Lumbar Spine Wo Contrast Result Date: 12/30/2023 CLINICAL DATA:  Back trauma. Mechanical fall  prior to admission at home. Low back pain. EXAM: CT LUMBAR SPINE WITHOUT CONTRAST TECHNIQUE: Multidetector CT imaging of the lumbar spine was performed without intravenous contrast administration. Multiplanar CT image reconstructions were also generated. RADIATION DOSE REDUCTION: This exam was performed according to the departmental dose-optimization program which includes automated exposure control, adjustment of the mA and/or kV according to patient size and/or use of iterative reconstruction technique. COMPARISON:  Lumbar spine radiographs 01/05/2019, lumbar MRI 02/28/2017 and lumbar CT discogram 03/23/2007 FINDINGS: Segmentation: There are 5 lumbar type vertebral bodies. Alignment: Physiologic. Vertebrae: There are stable remote postsurgical changes from previous L3 through S1 PLIF. The hardware appears intact, without loosening. There is solid interbody fusion at each level. There are nonacute fractures involving the right L1 and L2 transverse processes. No evidence of acute fracture or traumatic subluxation. Paraspinal and other soft tissues: No acute paraspinal findings. Aortic and branch vessel atherosclerosis. Fatty atrophy of the erector spinae musculature inferiorly. Disc levels: No significant disc space findings demonstrated within the visualized lower thoracic spine. At L1-2, there is progressive loss of disc height with annular disc bulging, endplate osteophytes and vacuum phenomenon. Mild bilateral facet hypertrophy. Resulting mild spinal stenosis without significant foraminal narrowing. L2-3: Stable  postsurgical changes from previous left laminectomy. Progressive loss of disc height with annular disc bulging, endplate osteophytes and vacuum phenomenon. Mild bilateral facet hypertrophy. No significant spinal stenosis. Mild foraminal narrowing bilaterally. No acute findings are demonstrated at the operative levels status post L3 through S1 fusion. The spinal canal is decompressed. There is mild  chronic osseous foraminal narrowing on the left at L4-5. IMPRESSION: 1. No evidence of acute lumbar spine fracture, traumatic subluxation or static signs of instability. Nonacute fractures of the right L1 and L2 transverse processes. 2. Stable remote postsurgical changes from previous L3 through S1 PLIF. 3. 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 No high-grade spinal stenosis or definite nerve root encroachment. 4.  Aortic Atherosclerosis (ICD10-I70.0). Electronically Signed   By: Elsie Perone M.D.   On: 12/30/2023 14:10   DG Chest Port 1 View Result Date: 12/30/2023 CLINICAL DATA:  Status post fall. EXAM: PORTABLE CHEST 1 VIEW COMPARISON:  June 10, 2023 FINDINGS: The cardiac silhouette is mildly enlarged and unchanged in size. There is marked severity calcification of the aortic arch. No acute infiltrate, pleural effusion or pneumothorax is identified. The visualized skeletal structures are unremarkable. IMPRESSION: No active disease. Electronically Signed   By: Suzen Dials M.D.   On: 12/30/2023 13:34   DG Wrist Complete Right Result Date: 12/30/2023 EXAM: 3 or more VIEW(S) XRAY OF THE RIGHT WRIST 12/30/2023 01:04:00 PM COMPARISON: None available. CLINICAL HISTORY: fall, wrist pain and swelling. Pt arrives by [facility] due to mechanical fall pta at home. Pt has swelling and pain to right wrist which was splinted by paramedics. Pt also has pain to lower back and stuck head. No LOC. No blood thinners, takes 81mg  aspirin  FINDINGS: BONES AND JOINTS: Mildly displaced distal right radial fracture is noted. Severe osteoarthritis is seen involving the first carpometacarpal joint. Calcification of triangular fibrocartilage is noted. No focal osseous lesion. No joint dislocation. SOFT TISSUES: The soft tissues are unremarkable. IMPRESSION: 1. Mildly displaced distal right radial fracture. 2. Severe osteoarthritis of the first carpometacarpal joint. 3. Calcification of the triangular  fibrocartilage complex (chondrocalcinosis). Electronically signed by: Lynwood Seip MD 12/30/2023 01:32 PM EDT RP Workstation: [id number]   DG Pelvis Portable Result Date: 12/30/2023 CLINICAL DATA:  Status post fall. EXAM: PORTABLE PELVIS 1-2 VIEWS COMPARISON:  January 05, 2019 FINDINGS: There is no evidence of pelvic fracture or diastasis. No pelvic bone lesions are seen. Degenerative changes are seen involving both hips in the form of joint space narrowing and acetabular sclerosis. Postoperative changes are noted within the lumbar spine. IMPRESSION: No acute osseous abnormality. Electronically Signed   By: Suzen Dials M.D.   On: 12/30/2023 13:32        Scheduled Meds:  ARIPiprazole   2 mg Oral Daily   buPROPion   150 mg Oral Daily   Influenza vac split trivalent PF  0.5 mL Intramuscular Tomorrow-1000   memantine   5 mg Oral BID   pantoprazole  40 mg Oral Daily   rOPINIRole   1 mg Oral Daily   rOPINIRole   2 mg Oral QHS   sertraline   50 mg Oral Daily   umeclidinium bromide  1 puff Inhalation Daily   Continuous Infusions:  cefTRIAXone  (ROCEPHIN )  IV     lactated ringers  75 mL/hr at 12/31/23 0632     LOS: 0 days    Time spent: 35 minutes    Antionette Luster A Anh Mangano, MD [facility]   If 7PM-7AM, please contact night-coverage [email or web address]  12/31/2023, 6:46 AM

## 2023-12-31 NOTE — Evaluation (Signed)
 Occupational Therapy Evaluation Patient Details Name: [name] MRN: [id number] DOB: Feb 19, 1930 Today's Date: 12/31/2023   History of Present Illness   [name] is a 89 yr old female brought to the hospital after a fall. She was found to have concern for hypotension/near syncope, and a mildly displaced R distal radius fracture. PMH: dementia, asthma, chronic hypoxemic respiratory failure on 2L O2, HTN, HLD, CKD III, anxiety, depression, polymyalgia rheumatica, RLS, GERD, vitamin B12 deficiency, GCA     Clinical Impressions The pt is currently presenting well below her baseline level of functioning for self-care management. She is limited by the below listed deficits (see OT problem list). As such, her occupational performance is compromised and she requires significant assistance for self-care management. During the session today, she required mod to max assist for bed mobility, max assist for lower body dressing, and set-up assist for face washing and drinking from a cup seated EOB; pt used non-dominant LUE for grooming and self-feeding. She was also noted to be limited by general deconditioning and increased low back pain and R wrist pain. Her overall activity tolerance was compromised, due to pain, especially her low back pain which worsened in sitting. She will benefit from further OT services to maximize her independence with self-care tasks and to decrease the risk for progressive weakness and deconditioning. Patient will benefit from continued inpatient follow up therapy, <3 hours/day.      If plan is discharge home, recommend the following:   A lot of help with walking and/or transfers;A lot of help with bathing/dressing/bathroom;Direct supervision/assist for medications management;Supervision due to cognitive status;Direct supervision/assist for financial management;Assist for transportation;Assistance with cooking/housework;Help with stairs or ramp for entrance     Functional  Status Assessment   Patient has had a recent decline in their functional status and demonstrates the ability to make significant improvements in function in a reasonable and predictable amount of time.     Equipment Recommendations   Other (comment) (to be determined)     Recommendations for Other Services         Precautions/Restrictions   Precautions Precautions: Fall Restrictions RUE Weight Bearing Per Provider Order: Non weight bearing     Mobility Bed Mobility Overal bed mobility: Needs Assistance Bed Mobility: Supine to Sit, Sit to Supine     Supine to sit: Max assist, HOB elevated Sit to supine: Mod assist, +2 for physical assistance        Transfers Overall transfer level: Needs assistance      General transfer comment: Pt was instructed on lateral scooting towards the head of the bed, however she was unable to clear her buttocks off the bed, in order to perform      Balance     Sitting balance-Leahy Scale: Fair         ADL either performed or assessed with clinical judgement   ADL Overall ADL's : Needs assistance/impaired Eating/Feeding: Sitting;Set up;Supervision/ safety Eating/Feeding Details (indicate cue type and reason): Assist needed to cut food, remove lids, and open packets. R hand ROM limited due to R wrist fracture and applied dressing/splint. Pt also likely to have difficulty with tasks, due to reliance on non-dominant LUE to assist with feeding at current Grooming: Minimal assistance;Sitting   Upper Body Bathing: Moderate assistance;Sitting   Lower Body Bathing: Maximal assistance;Sitting/lateral leans   Upper Body Dressing : Moderate assistance;Sitting   Lower Body Dressing: Maximal assistance;Sitting/lateral leans       Toileting- Clothing Manipulation and Hygiene: Maximal assistance Toileting -  Clothing Manipulation Details (indicate cue type and reason): at bed level, based on clinical judgement               Pertinent Vitals/Pain Pain Assessment Pain Assessment: 0-10 Pain Score: 5  Pain Location: R wrist Pain Intervention(s): Limited activity within patient's tolerance, Monitored during session, Repositioned     Extremity/Trunk Assessment Upper Extremity Assessment Upper Extremity Assessment: Right hand dominant;RUE deficits/detail;LUE deficits/detail RUE Deficits / Details: Elbow and shoulder AROM WFL. Wrist ROM not assessed, due to current fracture and applied dressing/splint LUE Deficits / Details: AROM WFL. Functional grip strength   Lower Extremity Assessment Lower Extremity Assessment: RLE deficits/detail;LLE deficits/detail;Generalized weakness RLE Deficits / Details: AROM WFL LLE Deficits / Details: AROM WFL.     Communication Communication Communication: No apparent difficulties Factors Affecting Communication: Hearing impaired   Cognition Arousal: Alert Behavior During Therapy: WFL for tasks assessed/performed Cognition: History of cognitive impairments, Cognition impaired       Memory impairment (select all impairments): Declarative long-term memory, Working memory Attention impairment (select first level of impairment): Divided attention, Alternating attention Executive functioning impairment (select all impairments): Organization, Problem solving OT - Cognition Comments: Able to follow step commands most of the time, though needed occasional repetition of prompts, though this may have been at least partly due to her hearing impairment                                    Home Living Family/patient expects to be discharged to:: Private residence Living Arrangements: Children (Son) Available Help at Discharge: Family Type of Home: House Home Access: Ramped entrance Entrance Stairs-Number of Steps: small threshold and a ramp to access home Entrance Stairs-Rails: None Home Layout: One level     Bathroom Shower/Tub: Higher education careers adviser: Handicapped height Bathroom Accessibility: Yes   Home Equipment: Rollator (4 wheels);BSC/3in1;Shower seat;Wheelchair - manual   Additional Comments: Pt's daughter was present and assisted with providing info regarding the pt's prior level of functioning and living situation.      Prior Functioning/Environment Prior Level of Function : Needs assist             Mobility Comments: She used a rollator for household ambulation and manual wheelchair in the community. Per the pt's daughter, the pt is unable to ambulate longer distances. ADLs Comments: The pt required assistance for lower body dressing and bathing, her family managed the cooking and cleaning, she performed toileting without the need for assistance, and she does not drive. She used 2L O2 as needed during the day & most all the time at night.    OT Problem List: Decreased strength;Decreased range of motion;Decreased activity tolerance;Impaired balance (sitting and/or standing);Decreased cognition;Decreased knowledge of use of DME or AE;Decreased knowledge of precautions;Impaired UE functional use;Pain   OT Treatment/Interventions: Self-care/ADL training;Cognitive remediation/compensation;Energy conservation;DME and/or AE instruction;Patient/family education;Balance training      OT Goals(Current goals can be found in the care plan section)   Acute Rehab OT Goals OT Goal Formulation: With patient/family Time For Goal Achievement: 01/14/24 Potential to Achieve Goals: Good ADL Goals Pt Will Perform Eating: with set-up;with supervision;sitting;with adaptive utensils Pt Will Perform Grooming: with set-up;with supervision;sitting Pt Will Perform Upper Body Dressing: with min assist;sitting Pt Will Transfer to Toilet: with contact guard assist;stand pivot transfer;bedside commode Additional ADL Goal #1: The pt will perform bed mobility with CGA, in prep for progressive ADL participation.  OT Frequency:  Min 2X/week        AM-PAC OT 6 Clicks Daily Activity     Outcome Measure Help from another person eating meals?: A Little Help from another person taking care of personal grooming?: A Little Help from another person toileting, which includes using toliet, bedpan, or urinal?: A Lot Help from another person bathing (including washing, rinsing, drying)?: A Lot Help from another person to put on and taking off regular upper body clothing?: A Lot Help from another person to put on and taking off regular lower body clothing?: A Lot 6 Click Score: 14   End of Session Equipment Utilized During Treatment: Oxygen  Nurse Communication: Mobility status  Activity Tolerance: Patient limited by pain Patient left: in bed;with call bell/phone within reach;with bed alarm set;with family/visitor present  OT Visit Diagnosis: Other abnormalities of gait and mobility (R26.89);History of falling (Z91.81);Muscle weakness (generalized) (M62.81);Other symptoms and signs involving cognitive function;Pain Pain - Right/Left: Right Pain - part of body:  (wrist)                Time: 8475-8442 OT Time Calculation (min): 33 min Charges:  OT General Charges $OT Visit: 1 Visit OT Evaluation $OT Eval Moderate Complexity: 1 Mod OT Treatments $Self Care/Home Management : 8-22 mins    Delanna JINNY Lesches, OTR/L 12/31/2023, 4:28 PM

## 2023-12-31 NOTE — Progress Notes (Signed)
 Called Daughter Donzell with an update at 11:10pm on 01-31-24.

## 2023-12-31 NOTE — Consult Note (Signed)
 Reason for Consult:Right wrist fx Referring Physician: Owen Lore Time called: 1220 Time at bedside: 1343   [name] is an 89 y.o. female.  HPI: [place] fell yesterday after losing her balance. She had immediate right wrist pain. She was brought to the ED where x-rays showed a distal radius fx and orthopedic surgery was consulted. She is RHD and lives at home with family. She uses a RW for ambulation.  Past Medical History:  Diagnosis Date   Abdominal bloating    Abnormal LFTs (liver function tests)    Ankle fracture, bimalleolar, closed, right, initial encounter    Anxiety disorder    Arthritis    Asthma    Bimalleolar ankle fracture, right, closed, initial encounter 01/06/2019   Depression    Diverticulosis    Edema    Fatty liver    Fibromyalgia    GERD (gastroesophageal reflux disease)    Giant cell arteritis (HCC)    No biopsy secondary to steroid use   Hx of adenomatous colonic polyps    Hyperlipidemia    Hypertension    IBS (irritable bowel syndrome)    Lumbar radiculitis    Osteoporosis 09/2017   T score -2.6 left femoral neck   Peptic ulcer disease    Skin cancer    shoulder, leg, right eye (? squamous by resection)   Thoracic radiculitis    Thyroid  nodule    removed   Urinary incontinence    Vertigo     Past Surgical History:  Procedure Laterality Date   ABDOMINAL HYSTERECTOMY     BSO   BACK SURGERY  2005   lower back   EYE SURGERY Right 2006   LIVER BIOPSY     fatty liver   NASAL SEPTUM SURGERY     ORIF ANKLE FRACTURE Right 01/11/2019   Procedure: OPEN REDUCTION INTERNAL FIXATION (ORIF) RIGHT BIMALLEOLAR ANKLE FRACTURE;  Surgeon: Jerri Kay HERO, MD;  Location: [facility];  Service: Orthopedics;  Laterality: Right;   right knee surgery  2006   arthroscopy   right leg skin cancer surgery  2014   right shoulder skin cancer excision  2012   SKIN CANCER EXCISION     THYROIDECTOMY      Family History  Problem Relation Age  of Onset   Colon polyps Maternal Aunt    Diabetes Father    Heart disease Father    Heart disease Mother    Lung disease Mother    COPD Mother    Lung cancer Brother    Cancer Brother        lung   COPD Brother    Stroke Brother    Lung cancer Sister    COPD Sister    Kidney disease Other        niece-mat side   Arthritis Other    Diabetes Son    Arthritis Daughter    Colon cancer Neg Hx     Social History:  reports that she has never smoked. She has never used smokeless tobacco. She reports that she does not drink alcohol and does not use drugs.  Allergies:  Allergies  Allergen Reactions   Oxycodone  Itching and Other (See Comments)    hallucinations   Statins Other (See Comments)    Myalgia    Advair Diskus [Fluticasone -Salmeterol] Other (See Comments)    Mouth sores   Atorvastatin  Other (See Comments)    Severe headache   Sulfonamide Derivatives Rash    Medications: I have reviewed  the patient's current medications.  Results for orders placed or performed during the hospital encounter of 12/30/23 (from the past 48 hours)  Comprehensive metabolic panel     Status: Abnormal   Collection Time: 12/30/23  1:08 PM  Result Value Ref Range   Sodium 138 135 - 145 mmol/L   Potassium 4.3 3.5 - 5.1 mmol/L   Chloride 102 98 - 111 mmol/L   CO2 28 22 - 32 mmol/L   Glucose, Bld 105 (H) 70 - 99 mg/dL    Comment: Glucose reference range applies only to samples taken after fasting for at least 8 hours.   BUN 18 8 - 23 mg/dL   Creatinine, Ser 8.89 (H) 0.44 - 1.00 mg/dL   Calcium  9.8 8.9 - 10.3 mg/dL   Total Protein 6.0 (L) 6.5 - 8.1 g/dL   Albumin 4.0 3.5 - 5.0 g/dL   AST 36 15 - 41 U/L   ALT 24 0 - 44 U/L   Alkaline Phosphatase 98 38 - 126 U/L   Total Bilirubin 0.4 0.0 - 1.2 mg/dL   GFR, Estimated 47 (L) >60 mL/min    Comment: (NOTE) Calculated using the CKD-EPI Creatinine Equation (2021)    Anion gap 8 5 - 15    Comment: Performed at [facility], [address], [place], [place] [address]  CBC     Status: Abnormal   Collection Time: 12/30/23  1:08 PM  Result Value Ref Range   WBC 15.3 (H) 4.0 - 10.5 K/uL   RBC 4.09 3.87 - 5.11 MIL/uL   Hemoglobin 12.7 12.0 - 15.0 g/dL   HCT 61.6 63.9 - 53.9 %   MCV 93.6 80.0 - 100.0 fL   MCH 31.1 26.0 - 34.0 pg   MCHC 33.2 30.0 - 36.0 g/dL   RDW 86.8 88.4 - 84.4 %   Platelets 168 150 - 400 K/uL   nRBC 0.0 0.0 - 0.2 %    Comment: Performed at [facility], [address], [place], [place] [address]  Ethanol     Status: None   Collection Time: 12/30/23  1:08 PM  Result Value Ref Range   Alcohol, Ethyl (B) <15 <15 mg/dL    Comment: (NOTE) For medical purposes only. Performed at [facility], [address], [place], [place] [address]   Protime-INR     Status: None   Collection Time: 12/30/23  1:08 PM  Result Value Ref Range   Prothrombin Time 13.5 11.4 - 15.2 seconds   INR 1.0 0.8 - 1.2    Comment: (NOTE) INR goal varies based on device and disease states. Performed at [facility], [address], [place], [place] [address]   Troponin T, High Sensitivity     Status: Abnormal   Collection Time: 12/30/23  1:08 PM  Result Value Ref Range   Troponin T High Sensitivity 71 (H) 0 - 19 ng/L    Comment: (NOTE) Biotin concentrations > 1000 ng/mL falsely decrease TnT results.  Serial cardiac troponin measurements are suggested.  Refer to the Links section for chest pain algorithms and additional  guidance. Performed at [facility], [address], [place], [place] [address]   Urinalysis, Routine w reflex microscopic -Urine, Clean Catch     Status: Abnormal   Collection Time: 12/30/23  4:26 PM  Result Value Ref Range   Color, Urine YELLOW YELLOW   APPearance CLEAR CLEAR   Specific Gravity, Urine 1.013 1.005 - 1.030   pH 6.0 5.0 -  8.0   Glucose, UA NEGATIVE NEGATIVE mg/dL   Hgb urine dipstick NEGATIVE  NEGATIVE   Bilirubin Urine NEGATIVE NEGATIVE   Ketones, ur 15 (A) NEGATIVE mg/dL   Protein, ur 30 (A) NEGATIVE mg/dL   Nitrite NEGATIVE NEGATIVE   Leukocytes,Ua SMALL (A) NEGATIVE   RBC / HPF 0-5 0 - 5 RBC/hpf   WBC, UA 6-10 0 - 5 WBC/hpf   Bacteria, UA MANY (A) NONE SEEN   Squamous Epithelial / HPF 0-5 0 - 5 /HPF   Mucus PRESENT     Comment: Performed at [facility], [address], [place], [place] [address]  Blood culture (routine x 2)     Status: None (Preliminary result)   Collection Time: 12/30/23  6:10 PM   Specimen: BLOOD  Result Value Ref Range   Specimen Description      BLOOD RIGHT ANTECUBITAL Performed at [facility], [address] [place], [place] [address]    Special Requests      Blood Culture adequate volume BOTTLES DRAWN AEROBIC AND ANAEROBIC Performed at [facility], [address] [place], [place] [address]    Culture      NO GROWTH < 12 HOURS Performed at [facility], [address] [place], [place] [address]    Report Status PENDING   Blood culture (routine x 2)     Status: None (Preliminary result)   Collection Time: 12/30/23  6:15 PM   Specimen: BLOOD  Result Value Ref Range   Specimen Description      BLOOD LEFT ANTECUBITAL Performed at [facility], [address] [place], [place] [address]    Special Requests      Blood Culture adequate volume BOTTLES DRAWN AEROBIC AND ANAEROBIC Performed at [facility], [address], [place], [place] [address]    Culture      NO GROWTH < 12 HOURS Performed at [facility], [address] [place], [place] [address]    Report Status PENDING   Lactic acid, plasma     Status: None   Collection Time: 12/30/23  6:24 PM  Result Value Ref Range   Lactic Acid, Venous 1.0 0.5 - 1.9 mmol/L    Comment: Performed at [facility], [address] [place], [place] [address]   Lactic acid, plasma     Status: None   Collection Time: 12/30/23  9:07 PM  Result Value Ref Range   Lactic Acid, Venous 1.2 0.5 - 1.9 mmol/L    Comment: Performed at [facility], [address], [place], [place] [address]  CBC     Status: Abnormal   Collection Time: 12/31/23 12:04 AM  Result Value Ref Range   WBC 17.6 (H) 4.0 - 10.5 K/uL   RBC 3.80 (L) 3.87 - 5.11 MIL/uL   Hemoglobin 12.0 12.0 - 15.0 g/dL   HCT 62.7 63.9 - 53.9 %   MCV 97.9 80.0 - 100.0 fL   MCH 31.6 26.0 - 34.0 pg   MCHC 32.3 30.0 - 36.0 g/dL   RDW 86.7 88.4 - 84.4 %   Platelets 153 150 - 400 K/uL   nRBC 0.0 0.0 - 0.2 %    Comment: Performed at [facility], [address], [place], [place] [address]  Hepatitis panel, acute     Status: None   Collection Time: 12/31/23 12:04 AM  Result Value Ref Range   Hepatitis B Surface Ag NON REACTIVE NON REACTIVE  HCV Ab NON REACTIVE NON REACTIVE    Comment: (NOTE) Nonreactive HCV antibody screen is consistent with no HCV infections,  unless recent infection is suspected or other evidence exists to indicate HCV infection.     Hep A IgM NON REACTIVE NON REACTIVE   Hep B C IgM NON REACTIVE NON REACTIVE    Comment: Performed at [facility], [address], [place], [place] [address]  Troponin T, High Sensitivity     Status: Abnormal   Collection Time: 12/31/23 12:04 AM  Result Value Ref Range   Troponin T High Sensitivity 63 (H) 0 - 19 ng/L    Comment: (NOTE) Biotin concentrations > 1000 ng/mL falsely decrease TnT results.  Serial cardiac troponin measurements are suggested.  Refer to the Links section for chest pain algorithms and additional  guidance. Performed at [facility], [address], [place], [place] [address]   Magnesium      Status: None   Collection Time: 12/31/23 12:04 AM  Result Value Ref Range   Magnesium  2.2 1.7 - 2.4 mg/dL    Comment: Performed at [facility],  [address], [place], [place] [address]    CT CHEST ABDOMEN PELVIS W CONTRAST Result Date: 12/30/2023 EXAM: CT CHEST, ABDOMEN AND PELVIS WITH CONTRAST 12/30/2023 05:03:32 PM TECHNIQUE: CT of the chest, abdomen and pelvis was performed with the administration of 100 mL of iohexol  (OMNIPAQUE ) 300 MG/ML solution. Multiplanar reformatted images are provided for review. Automated exposure control, iterative reconstruction, and/or weight based adjustment of the mA/kV was utilized to reduce the radiation dose to as low as reasonably achievable. COMPARISON: CT angiogram chest 325 2000 and 25. CT abdomen and pelvis 106 2012. CLINICAL HISTORY: Polytrauma, blunt. Triage note: Pt arrives by [facility] due to mechanical fall pta at home. Pt has swelling and pain to right wrist which was splinted by paramedics. Pt also has pain to lower back and stuck head. No LOC. No blood thinners, takes 81mg  aspirin . FINDINGS: CHEST: MEDIASTINUM AND LYMPH NODES: Atherosclerotic calcifications at the aorta and coronary arteries. The heart is mildly enlarged. There is no mediastinal hematoma or pneumomediastinum. The central airways are clear. No mediastinal, hilar or axillary lymphadenopathy. LUNGS AND PLEURA: Minimal atelectasis in the lingula. Minimal tree-in-bud opacities in the bilateral upper lobes similar to prior study. A few scattered 2 mm nodular densities, unchanged. No focal consolidation or pulmonary edema. No pleural effusion or pneumothorax. ABDOMEN AND PELVIS: LIVER: There is a new heterogeneous hypodense lesion in the right lobe of the liver measuring 4.9x3.1 cm with enhancing septations. Additional scattered subcentimeter rounded hypodensities are similar to prior, likely cysts or hemangiomas. Calcified granulomas in the liver. No liver laceration or subcapsular fluid collection. GALLBLADDER AND BILE DUCTS: Gallbladder is unremarkable. No biliary ductal dilatation. SPLEEN: No acute abnormality. PANCREAS: No acute  abnormality. ADRENAL GLANDS: No acute abnormality. KIDNEYS, URETERS AND BLADDER: No stones in the kidneys or ureters. No hydronephrosis. No perinephric or periureteral stranding. Urinary bladder is unremarkable. GI AND BOWEL: Stomach demonstrates no acute abnormality. Circumferential wall thickening of the proximal transverse colon without surrounding inflammation. There are small surrounding lymph nodes. Appendix appears normal. Sigmoid colon diverticulosis. There is no bowel obstruction. REPRODUCTIVE ORGANS: Uterus is surgically absent. PERITONEUM AND RETROPERITONEUM: No ascites. No free air. VASCULATURE: Aorta is normal in caliber. Atherosclerotic calcifications throughout the aorta. ABDOMINAL AND PELVIS LYMPH NODES: No lymphadenopathy. BONES AND SOFT TISSUES: Stable chronic compression deformity of T5. Healing right L1 and L2 transverse process fractures  which are new from prior. L3-S1 posterior fusion hardware present. No acute fractures are identified. Chronic right hip joint effusion appears unchanged. No focal soft tissue abnormality. IMPRESSION: 1. No acute posttraumatic sequelae in the chest, abdomen, or pelvis. 2. New heterogeneous lesion in the right lobe of the liver measuring 4.9 x 3.1 cm with enhancing septations. Findings are concerning for a primary neoplasm, metastatic disease, or infection in the appropriate clinical setting. 3. Circumferential wall thickening of the proximal transverse colon with small regional lymph nodes. No bowel obstruction. Malignancy is not excluded. Recommend follow-up direct visualization. 4. Chronic tree-in-bud opacities in the upper lobes appear unchanged, likely infectious/inflammatory. Electronically signed by: Greig Pique MD 12/30/2023 05:25 PM EDT RP Workstation: [id number]   CT HEAD WO CONTRAST Result Date: 12/30/2023 EXAM: CT HEAD WITHOUT CONTRAST 12/30/2023 01:57:54 PM TECHNIQUE: CT of the head was performed without the administration of intravenous  contrast. Automated exposure control, iterative reconstruction, and/or weight based adjustment of the mA/kV was utilized to reduce the radiation dose to as low as reasonably achievable. COMPARISON: Head CT 08/03/2021 and MRI 02/25/2020. CLINICAL HISTORY: Head trauma, moderate-severe. Mechanical fall, struck head. No loss of consciousness (LOC). Takes 81mg  aspirin . FINDINGS: BRAIN AND VENTRICLES: There is no evidence of an acute infarct, intracranial hemorrhage, mass, midline shift, hydrocephalus, or extra-axial fluid collection. Cerebral atrophy is within normal limits for age. Patchy cerebral white matter hypodensities are similar to the prior CT and are nonspecific but compatible with mild to moderate chronic small vessel ischemic disease. Calcified atherosclerosis at the skull base. ORBITS: Bilateral cataract extraction. SINUSES: No acute abnormality. SOFT TISSUES AND SKULL: No acute soft tissue abnormality. No skull fracture. IMPRESSION: 1. No acute intracranial abnormality. 2. Mild to moderate chronic small vessel ischemic disease. Electronically signed by: Dasie Hamburg MD 12/30/2023 02:29 PM EDT RP Workstation: [id number]   CT CERVICAL SPINE WO CONTRAST Result Date: 12/30/2023 CLINICAL DATA:  Polytrauma, blunt Mechanical fall at home.  Head injury. EXAM: CT CERVICAL SPINE WITHOUT CONTRAST TECHNIQUE: Multidetector CT imaging of the cervical spine was performed without intravenous contrast. Multiplanar CT image reconstructions were also generated. RADIATION DOSE REDUCTION: This exam was performed according to the departmental dose-optimization program which includes automated exposure control, adjustment of the mA and/or kV according to patient size and/or use of iterative reconstruction technique. COMPARISON:  Cervical spine radiographs 03/14/2020. FINDINGS: Alignment: Straightening with a minimal degenerative anterolisthesis at C2-3 and minimal retrolisthesis at C4-5. Skull base and vertebrae: No evidence  of acute cervical spine fracture or traumatic subluxation. Soft tissues and spinal canal: No prevertebral fluid or swelling. No visible canal hematoma. Disc levels: Multilevel spondylosis with disc space narrowing, uncinate spurring and facet hypertrophy. There is asymmetric facet hypertrophy on the right at C2-3. Up to mild foraminal narrowing at multiple levels. No large disc herniation or high-grade spinal stenosis identified. Upper chest: Clear lung apices. Other: Bilateral carotid atherosclerosis. Small calcified right cervical lymph nodes. IMPRESSION: 1. No evidence of acute cervical spine fracture, traumatic subluxation or static signs of instability. 2. Multilevel cervical spondylosis as described. Electronically Signed   By: Elsie Perone M.D.   On: 12/30/2023 14:14   CT Lumbar Spine Wo Contrast Result Date: 12/30/2023 CLINICAL DATA:  Back trauma. Mechanical fall prior to admission at home. Low back pain. EXAM: CT LUMBAR SPINE WITHOUT CONTRAST TECHNIQUE: Multidetector CT imaging of the lumbar spine was performed without intravenous contrast administration. Multiplanar CT image reconstructions were also generated. RADIATION DOSE REDUCTION: This exam was performed according to the departmental dose-optimization  program which includes automated exposure control, adjustment of the mA and/or kV according to patient size and/or use of iterative reconstruction technique. COMPARISON:  Lumbar spine radiographs 01/05/2019, lumbar MRI 02/28/2017 and lumbar CT discogram 03/23/2007 FINDINGS: Segmentation: There are 5 lumbar type vertebral bodies. Alignment: Physiologic. Vertebrae: There are stable remote postsurgical changes from previous L3 through S1 PLIF. The hardware appears intact, without loosening. There is solid interbody fusion at each level. There are nonacute fractures involving the right L1 and L2 transverse processes. No evidence of acute fracture or traumatic subluxation. Paraspinal and other soft  tissues: No acute paraspinal findings. Aortic and branch vessel atherosclerosis. Fatty atrophy of the erector spinae musculature inferiorly. Disc levels: No significant disc space findings demonstrated within the visualized lower thoracic spine. At L1-2, there is progressive loss of disc height with annular disc bulging, endplate osteophytes and vacuum phenomenon. Mild bilateral facet hypertrophy. Resulting mild spinal stenosis without significant foraminal narrowing. L2-3: Stable postsurgical changes from previous left laminectomy. Progressive loss of disc height with annular disc bulging, endplate osteophytes and vacuum phenomenon. Mild bilateral facet hypertrophy. No significant spinal stenosis. Mild foraminal narrowing bilaterally. No acute findings are demonstrated at the operative levels status post L3 through S1 fusion. The spinal canal is decompressed. There is mild chronic osseous foraminal narrowing on the left at L4-5. IMPRESSION: 1. No evidence of acute lumbar spine fracture, traumatic subluxation or static signs of instability. Nonacute fractures of the right L1 and L2 transverse processes. 2. Stable remote postsurgical changes from previous L3 through S1 PLIF. 3. 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 No high-grade spinal stenosis or definite nerve root encroachment. 4.  Aortic Atherosclerosis (ICD10-I70.0). Electronically Signed   By: Elsie Perone M.D.   On: 12/30/2023 14:10   DG Chest Port 1 View Result Date: 12/30/2023 CLINICAL DATA:  Status post fall. EXAM: PORTABLE CHEST 1 VIEW COMPARISON:  June 10, 2023 FINDINGS: The cardiac silhouette is mildly enlarged and unchanged in size. There is marked severity calcification of the aortic arch. No acute infiltrate, pleural effusion or pneumothorax is identified. The visualized skeletal structures are unremarkable. IMPRESSION: No active disease. Electronically Signed   By: Suzen Dials M.D.   On: 12/30/2023 13:34    DG Wrist Complete Right Result Date: 12/30/2023 EXAM: 3 or more VIEW(S) XRAY OF THE RIGHT WRIST 12/30/2023 01:04:00 PM COMPARISON: None available. CLINICAL HISTORY: fall, wrist pain and swelling. Pt arrives by [facility] due to mechanical fall pta at home. Pt has swelling and pain to right wrist which was splinted by paramedics. Pt also has pain to lower back and stuck head. No LOC. No blood thinners, takes 81mg  aspirin  FINDINGS: BONES AND JOINTS: Mildly displaced distal right radial fracture is noted. Severe osteoarthritis is seen involving the first carpometacarpal joint. Calcification of triangular fibrocartilage is noted. No focal osseous lesion. No joint dislocation. SOFT TISSUES: The soft tissues are unremarkable. IMPRESSION: 1. Mildly displaced distal right radial fracture. 2. Severe osteoarthritis of the first carpometacarpal joint. 3. Calcification of the triangular fibrocartilage complex (chondrocalcinosis). Electronically signed by: Lynwood Seip MD 12/30/2023 01:32 PM EDT RP Workstation: [id number]   DG Pelvis Portable Result Date: 12/30/2023 CLINICAL DATA:  Status post fall. EXAM: PORTABLE PELVIS 1-2 VIEWS COMPARISON:  January 05, 2019 FINDINGS: There is no evidence of pelvic fracture or diastasis. No pelvic bone lesions are seen. Degenerative changes are seen involving both hips in the form of joint space narrowing and acetabular sclerosis. Postoperative changes are noted within the lumbar spine.  IMPRESSION: No acute osseous abnormality. Electronically Signed   By: Suzen Dials M.D.   On: 12/30/2023 13:32    Review of Systems  HENT:  Negative for ear discharge, ear pain, hearing loss and tinnitus.   Eyes:  Negative for photophobia and pain.  Respiratory:  Negative for cough and shortness of breath.   Cardiovascular:  Negative for chest pain.  Gastrointestinal:  Negative for abdominal pain, nausea and vomiting.  Genitourinary:  Negative for dysuria, flank pain, frequency and urgency.   Musculoskeletal:  Positive for arthralgias (Right wrist). Negative for back pain, myalgias and neck pain.  Neurological:  Negative for dizziness and headaches.  Hematological:  Does not bruise/bleed easily.  Psychiatric/Behavioral:  The patient is not nervous/anxious.    Blood pressure (!) 141/44, pulse 67, temperature 98.2 F (36.8 C), temperature source Oral, resp. rate 20, height 5' 1 (1.549 m), weight 76.3 kg, SpO2 100%. Physical Exam Constitutional:      General: She is not in acute distress.    Appearance: She is well-developed. She is not diaphoretic.  HENT:     Head: Normocephalic and atraumatic.  Eyes:     General: No scleral icterus.       Right eye: No discharge.        Left eye: No discharge.     Conjunctiva/sclera: Conjunctivae normal.  Cardiovascular:     Rate and Rhythm: Normal rate and regular rhythm.  Pulmonary:     Effort: Pulmonary effort is normal. No respiratory distress.  Musculoskeletal:     Cervical back: Normal range of motion.     Comments: Right shoulder, elbow, wrist, digits- no skin wounds, makeshift splint in place, severe wrist TTPno instability, no blocks to motion  Sens  Ax/R/M/U intact  Mot   Ax/ R/ PIN/ M/ AIN/ U intact  Rad 1+  Skin:    General: Skin is warm and dry.  Neurological:     Mental Status: She is alert.  Psychiatric:        Mood and Affect: Mood normal.        Behavior: Behavior normal.     Assessment/Plan: Right wrist fx -- Plan non-operative management with splint and NWB. Ok for WBAT through elbow. F/u with Dr. Beverley in 2 weeks.    Ozell DOROTHA Ned, PA-C Orthopedic Surgery [phone] 12/31/2023, 2:28 PM

## 2023-12-31 NOTE — Consult Note (Addendum)
 [facility] CONSULT NOTE  Patient Care Team: Wendolyn Jenkins Jansky, MD as PCP - General (Family Medicine) Ziolkowska, Aldona, MD (Internal Medicine) Okey Vina GAILS, MD as Consulting Physician (Cardiology) Obie Princella HERO, MD (Inactive) (Gastroenterology) Darlean Ozell NOVAK, MD as Consulting Physician (Pulmonary Disease) Lucilla Lynwood BRAVO, MD (Inactive) as Consulting Physician (Orthopedic Surgery) Eldonna Novel, MD as Consulting Physician (Physical Medicine and Rehabilitation) [facility] (Dentistry) Carolee Sherwood JONETTA DOUGLAS, MD as Consulting Physician (Urology)  CHIEF COMPLAINTS/PURPOSE OF CONSULTATION:  Liver mass with thickening colon  REFERRING PHYSICIAN: Dr. Madelyne  HISTORY OF PRESENTING ILLNESS:  [name] 89 y.o. female came to the ED on 12/30/2023 after a fall at home.  Patient reports falling backwards and hitting the back of her head and her back causing significant pain.  Imaging done showed hepatic lesion concerning for neoplasm or mets with thickening of transverse colon.  Therefore oncology evaluation has been requested. Patient is seen awake and alert laying in bed.  Patient's daughter Donzell is at bedside.  She states that her mother's mentation waxes and wanes, sometimes is better than at other times.  Of note patient wears bilateral hearing aids and had an issue with the hearing aids during the visit however she is able to respond well.  Reiterates falling and coming to the hospital for further evaluation. Medical history includes fatty liver, skin cancer removal, and multiple comorbidities. Surgical history includes skin cancer removal, hysterectomy, back surgery, and thyroidectomy. Family history is significant for patient's son currently being treated with chemotherapy and radiation for stomach and esophageal cancer.  Thinks there may be a cousin with unknown cancer. Social history is noncontributory.  States that she smoked a little bit a very long  time ago.  Denies alcohol use.  Denies drug use.  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     I have reviewed her chart and materials related to her cancer extensively and collaborated history with the patient. Summary of oncologic history is as follows: Oncology History   No history exists.    ASSESSMENT & PLAN:  Liver mass Circumferential wall thickening of proximal transverse colon - CT imaging done 12/30/2023 shows new lesion right lobe of liver 4.9 x 3.1 cm.  Concerning for primary neoplasm, metastatic disease, or infection. - Imaging also notes thickening of transverse colon with small lymph nodes.  Malignancy not excluded. - AFP marker pending - Discussed with patient's daughter Donzell that in order to discuss oncologic treatment, biopsy would be needed.  She states they would be open to biopsy just to find out what it is.  However she is opposed to any oncologic treatment at her mother's advanced age.  She wants to discuss her mother's current clinical status with her 2 brothers. - Medical oncology/Dr. Loretha following and will make further evaluation recommendations.  Status post fall Back pain - Pain meds as ordered - Patient with mild right radius fracture - Continue falls precautions - Continue supportive care  Leukocytosis UTI - WBC elevated 17.6 - UA shows many bacteria.  Urine cultures pending. - Continue antibiotics as ordered - Continue to monitor white count  Early dementia - On meds, continue as ordered - Per patient's daughter, patient will forget that you came into the room as soon as you leave. - Continue supportive care   MEDICAL HISTORY:  Past Medical History:  Diagnosis Date   Abdominal bloating    Abnormal LFTs (liver function tests)  Ankle fracture, bimalleolar, closed, right, initial encounter    Anxiety disorder    Arthritis    Asthma    Bimalleolar ankle fracture, right, closed, initial  encounter 01/06/2019   Depression    Diverticulosis    Edema    Fatty liver    Fibromyalgia    GERD (gastroesophageal reflux disease)    Giant cell arteritis (HCC)    No biopsy secondary to steroid use   Hx of adenomatous colonic polyps    Hyperlipidemia    Hypertension    IBS (irritable bowel syndrome)    Lumbar radiculitis    Osteoporosis 09/2017   T score -2.6 left femoral neck   Peptic ulcer disease    Skin cancer    shoulder, leg, right eye (? squamous by resection)   Thoracic radiculitis    Thyroid  nodule    removed   Urinary incontinence    Vertigo     SURGICAL HISTORY: Past Surgical History:  Procedure Laterality Date   ABDOMINAL HYSTERECTOMY     BSO   BACK SURGERY  2005   lower back   EYE SURGERY Right 2006   LIVER BIOPSY     fatty liver   NASAL SEPTUM SURGERY     ORIF ANKLE FRACTURE Right 01/11/2019   Procedure: OPEN REDUCTION INTERNAL FIXATION (ORIF) RIGHT BIMALLEOLAR ANKLE FRACTURE;  Surgeon: Jerri Kay HERO, MD;  Location: [facility];  Service: Orthopedics;  Laterality: Right;   right knee surgery  2006   arthroscopy   right leg skin cancer surgery  2014   right shoulder skin cancer excision  2012   SKIN CANCER EXCISION     THYROIDECTOMY      SOCIAL HISTORY: Social History   Socioeconomic History   Marital status: Widowed    Spouse name: Not on file   Number of children: 3   Years of education: Not on file   Highest education level: Not on file  Occupational History   Occupation: Retired Chief Financial Officer  Tobacco Use   Smoking status: Never   Smokeless tobacco: Never  Vaping Use   Vaping status: Never Used  Substance and Sexual Activity   Alcohol use: No   Drug use: No   Sexual activity: Not Currently    Comment: intercourser age 60, less than 5 secxual partners  Other Topics Concern   Not on file  Social History Narrative      children Darina Kuba and Anita-pt lives w/Gary, but others are neighbors.   Retired,  12th grade education.   Patient takes a daily vitamin   She wears her seat belt. Exercises greater than 3 times a week.   Wears 2 hearing aids. No dentures.   Sometimes requires assistive device for walking, either a walker or wheelchair.   There is a smoke detector in her home. There are firearms in her home. She feels safe in her relationships.   Right handed   Social Drivers of Health   Financial Resource Strain: Low Risk  (08/26/2023)   Overall Financial Resource Strain (CARDIA)    Difficulty of Paying Living Expenses: Not hard at all  Food Insecurity: No Food Insecurity (12/31/2023)   Hunger Vital Sign    Worried About Running Out of Food in the Last Year: Never true    Ran Out of Food in the Last Year: Never true  Transportation Needs: No Transportation Needs (12/31/2023)   PRAPARE - Administrator, Civil Service (Medical): No  Lack of Transportation (Non-Medical): No  Physical Activity: Insufficiently Active (08/26/2023)   Exercise Vital Sign    Days of Exercise per Week: 3 days    Minutes of Exercise per Session: 20 min  Stress: No Stress Concern Present (08/26/2023)   [employer] of Occupational Health - Occupational Stress Questionnaire    Feeling of Stress : Not at all  Social Connections: Socially Isolated (12/31/2023)   Social Connection and Isolation Panel    Frequency of Communication with Friends and Family: Twice a week    Frequency of Social Gatherings with Friends and Family: Twice a week    Attends Religious Services: Never    Database administrator or Organizations: No    Attends Banker Meetings: Never    Marital Status: Widowed  Intimate Partner Violence: Not At Risk (12/31/2023)   Humiliation, Afraid, Rape, and Kick questionnaire    Fear of Current or Ex-Partner: No    Emotionally Abused: No    Physically Abused: No    Sexually Abused: No    FAMILY HISTORY: Family History  Problem Relation Age of Onset   Colon  polyps Maternal Aunt    Diabetes Father    Heart disease Father    Heart disease Mother    Lung disease Mother    COPD Mother    Lung cancer Brother    Cancer Brother        lung   COPD Brother    Stroke Brother    Lung cancer Sister    COPD Sister    Kidney disease Other        niece-mat side   Arthritis Other    Diabetes Son    Arthritis Daughter    Colon cancer Neg Hx      PHYSICAL EXAMINATION: ECOG PERFORMANCE STATUS: 3 - Symptomatic, >50% confined to bed  Vitals:   12/31/23 1009 12/31/23 1010  BP:    Pulse:    Resp:    Temp:    SpO2: 94% 94%   Filed Weights   12/30/23 2100  Weight: 168 lb 3.4 oz (76.3 kg)    GENERAL: alert, no distress and comfortable +elderly pleasant  +hard of hearing SKIN: skin color, texture, turgor are normal, no rashes or significant lesions EYES: normal, conjunctiva are pink and non-injected, sclera clear OROPHARYNX: no exudate, no erythema and lips, buccal mucosa, and tongue normal  NECK: supple, thyroid  normal size, non-tender, without nodularity LYMPH: no palpable lymphadenopathy in the cervical, axillary or inguinal LUNGS: clear to auscultation and percussion with normal breathing effort HEART: regular rate & rhythm and no murmurs and no lower extremity edema ABDOMEN: abdomen soft, non-tender and normal bowel sounds MUSCULOSKELETAL: + Right arm with splint  PSYCH: alert & oriented + early dementia/memory issues NEURO: no focal motor/sensory deficits   ALLERGIES:  is allergic to atorvastatin , oxycodone , statins, advair diskus [fluticasone -salmeterol], and sulfonamide derivatives.  MEDICATIONS:  Current Facility-Administered Medications  Medication Dose Route Frequency Provider Last Rate Last Admin   acetaminophen  (TYLENOL ) tablet 650 mg  650 mg Oral Q6H PRN Alfornia Madison, MD       Or   acetaminophen  (TYLENOL ) suppository 650 mg  650 mg Rectal Q6H PRN Alfornia Madison, MD       albuterol  (PROVENTIL ) (2.5 MG/3ML) 0.083%  nebulizer solution 2.5 mg  2.5 mg Nebulization Q6H PRN Rathore, Vasundhra, MD       arformoterol (BROVANA) nebulizer solution 15 mcg  15 mcg Nebulization BID Regalado, Belkys A, MD  15 mcg at 12/31/23 1005   ARIPiprazole  (ABILIFY ) tablet 2 mg  2 mg Oral Daily Alfornia Madison, MD       budesonide  (PULMICORT ) nebulizer solution 0.25 mg  0.25 mg Nebulization BID Regalado, Belkys A, MD   0.25 mg at 12/31/23 1004   buPROPion  (WELLBUTRIN  XL) 24 hr tablet 150 mg  150 mg Oral Daily Alfornia Madison, MD       cefTRIAXone  (ROCEPHIN ) 1 g in sodium chloride  0.9 % 100 mL IVPB  1 g Intravenous Q24H Alfornia Madison, MD       diazepam  (VALIUM ) tablet 2.5 mg  2.5 mg Oral Q12H PRN Alfornia Madison, MD       hydrALAZINE (APRESOLINE) injection 5 mg  5 mg Intravenous Q6H PRN Alfornia Madison, MD       HYDROcodone -acetaminophen  (NORCO/VICODIN) 5-325 MG per tablet 1 tablet  1 tablet Oral Q6H PRN Alfornia Madison, MD   1 tablet at 12/31/23 9394   Influenza vac split trivalent PF (FLUZONE HIGH-DOSE) injection 0.5 mL  0.5 mL Intramuscular Tomorrow-1000 Blondie Lynwood POUR, NP       ipratropium-albuterol  (DUONEB) 0.5-2.5 (3) MG/3ML nebulizer solution 3 mL  3 mL Nebulization Q6H Regalado, Belkys A, MD   3 mL at 12/31/23 1004   lactated ringers  infusion   Intravenous Continuous Regalado, Belkys A, MD 75 mL/hr at 12/31/23 9367 New Bag at 12/31/23 9367   memantine  (NAMENDA ) tablet 5 mg  5 mg Oral BID Alfornia Madison, MD   5 mg at 12/31/23 0005   naloxone (NARCAN) injection 0.4 mg  0.4 mg Intravenous PRN Alfornia Madison, MD       Oral care mouth rinse  15 mL Mouth Rinse PRN Alfornia Madison, MD       pantoprazole (PROTONIX) EC tablet 40 mg  40 mg Oral Daily Rathore, Vasundhra, MD       rOPINIRole  (REQUIP ) tablet 1 mg  1 mg Oral Daily Alfornia Madison, MD       rOPINIRole  (REQUIP ) tablet 2 mg  2 mg Oral QHS Alfornia Madison, MD   2 mg at 12/31/23 0005   sertraline  (ZOLOFT ) tablet 50 mg  50 mg Oral Daily  Alfornia Madison, MD         LABORATORY DATA:  I have reviewed the data as listed Lab Results  Component Value Date   WBC 17.6 (H) 12/31/2023   HGB 12.0 12/31/2023   HCT 37.2 12/31/2023   MCV 97.9 12/31/2023   PLT 153 12/31/2023   Recent Labs    06/10/23 1315 12/30/23 1308  NA 140 138  K 4.5 4.3  CL 103 102  CO2 28 28  GLUCOSE 152* 105*  BUN 34* 18  CREATININE 1.12* 1.10*  CALCIUM  9.1 9.8  GFRNONAA 46* 47*  PROT  --  6.0*  ALBUMIN  --  4.0  AST  --  36  ALT  --  24  ALKPHOS  --  98  BILITOT  --  0.4    RADIOGRAPHIC STUDIES: I have personally reviewed the radiological images as listed and agreed with the findings in the report. CT CHEST ABDOMEN PELVIS W CONTRAST Result Date: 12/30/2023 EXAM: CT CHEST, ABDOMEN AND PELVIS WITH CONTRAST 12/30/2023 05:03:32 PM TECHNIQUE: CT of the chest, abdomen and pelvis was performed with the administration of 100 mL of iohexol  (OMNIPAQUE ) 300 MG/ML solution. Multiplanar reformatted images are provided for review. Automated exposure control, iterative reconstruction, and/or weight based adjustment of the mA/kV was utilized to reduce the radiation dose to as low  as reasonably achievable. COMPARISON: CT angiogram chest 325 2000 and 25. CT abdomen and pelvis 106 2012. CLINICAL HISTORY: Polytrauma, blunt. Triage note: Pt arrives by [facility] due to mechanical fall pta at home. Pt has swelling and pain to right wrist which was splinted by paramedics. Pt also has pain to lower back and stuck head. No LOC. No blood thinners, takes 81mg  aspirin . FINDINGS: CHEST: MEDIASTINUM AND LYMPH NODES: Atherosclerotic calcifications at the aorta and coronary arteries. The heart is mildly enlarged. There is no mediastinal hematoma or pneumomediastinum. The central airways are clear. No mediastinal, hilar or axillary lymphadenopathy. LUNGS AND PLEURA: Minimal atelectasis in the lingula. Minimal tree-in-bud opacities in the bilateral upper lobes similar to prior study. A  few scattered 2 mm nodular densities, unchanged. No focal consolidation or pulmonary edema. No pleural effusion or pneumothorax. ABDOMEN AND PELVIS: LIVER: There is a new heterogeneous hypodense lesion in the right lobe of the liver measuring 4.9x3.1 cm with enhancing septations. Additional scattered subcentimeter rounded hypodensities are similar to prior, likely cysts or hemangiomas. Calcified granulomas in the liver. No liver laceration or subcapsular fluid collection. GALLBLADDER AND BILE DUCTS: Gallbladder is unremarkable. No biliary ductal dilatation. SPLEEN: No acute abnormality. PANCREAS: No acute abnormality. ADRENAL GLANDS: No acute abnormality. KIDNEYS, URETERS AND BLADDER: No stones in the kidneys or ureters. No hydronephrosis. No perinephric or periureteral stranding. Urinary bladder is unremarkable. GI AND BOWEL: Stomach demonstrates no acute abnormality. Circumferential wall thickening of the proximal transverse colon without surrounding inflammation. There are small surrounding lymph nodes. Appendix appears normal. Sigmoid colon diverticulosis. There is no bowel obstruction. REPRODUCTIVE ORGANS: Uterus is surgically absent. PERITONEUM AND RETROPERITONEUM: No ascites. No free air. VASCULATURE: Aorta is normal in caliber. Atherosclerotic calcifications throughout the aorta. ABDOMINAL AND PELVIS LYMPH NODES: No lymphadenopathy. BONES AND SOFT TISSUES: Stable chronic compression deformity of T5. Healing right L1 and L2 transverse process fractures which are new from prior. L3-S1 posterior fusion hardware present. No acute fractures are identified. Chronic right hip joint effusion appears unchanged. No focal soft tissue abnormality. IMPRESSION: 1. No acute posttraumatic sequelae in the chest, abdomen, or pelvis. 2. New heterogeneous lesion in the right lobe of the liver measuring 4.9 x 3.1 cm with enhancing septations. Findings are concerning for a primary neoplasm, metastatic disease, or infection in the  appropriate clinical setting. 3. Circumferential wall thickening of the proximal transverse colon with small regional lymph nodes. No bowel obstruction. Malignancy is not excluded. Recommend follow-up direct visualization. 4. Chronic tree-in-bud opacities in the upper lobes appear unchanged, likely infectious/inflammatory. Electronically signed by: Greig Pique MD 12/30/2023 05:25 PM EDT RP Workstation: [id number]   CT HEAD WO CONTRAST Result Date: 12/30/2023 EXAM: CT HEAD WITHOUT CONTRAST 12/30/2023 01:57:54 PM TECHNIQUE: CT of the head was performed without the administration of intravenous contrast. Automated exposure control, iterative reconstruction, and/or weight based adjustment of the mA/kV was utilized to reduce the radiation dose to as low as reasonably achievable. COMPARISON: Head CT 08/03/2021 and MRI 02/25/2020. CLINICAL HISTORY: Head trauma, moderate-severe. Mechanical fall, struck head. No loss of consciousness (LOC). Takes 81mg  aspirin . FINDINGS: BRAIN AND VENTRICLES: There is no evidence of an acute infarct, intracranial hemorrhage, mass, midline shift, hydrocephalus, or extra-axial fluid collection. Cerebral atrophy is within normal limits for age. Patchy cerebral white matter hypodensities are similar to the prior CT and are nonspecific but compatible with mild to moderate chronic small vessel ischemic disease. Calcified atherosclerosis at the skull base. ORBITS: Bilateral cataract extraction. SINUSES: No acute abnormality. SOFT TISSUES AND SKULL:  No acute soft tissue abnormality. No skull fracture. IMPRESSION: 1. No acute intracranial abnormality. 2. Mild to moderate chronic small vessel ischemic disease. Electronically signed by: Dasie Hamburg MD 12/30/2023 02:29 PM EDT RP Workstation: [id number]   CT CERVICAL SPINE WO CONTRAST Result Date: 12/30/2023 CLINICAL DATA:  Polytrauma, blunt Mechanical fall at home.  Head injury. EXAM: CT CERVICAL SPINE WITHOUT CONTRAST TECHNIQUE: Multidetector  CT imaging of the cervical spine was performed without intravenous contrast. Multiplanar CT image reconstructions were also generated. RADIATION DOSE REDUCTION: This exam was performed according to the departmental dose-optimization program which includes automated exposure control, adjustment of the mA and/or kV according to patient size and/or use of iterative reconstruction technique. COMPARISON:  Cervical spine radiographs 03/14/2020. FINDINGS: Alignment: Straightening with a minimal degenerative anterolisthesis at C2-3 and minimal retrolisthesis at C4-5. Skull base and vertebrae: No evidence of acute cervical spine fracture or traumatic subluxation. Soft tissues and spinal canal: No prevertebral fluid or swelling. No visible canal hematoma. Disc levels: Multilevel spondylosis with disc space narrowing, uncinate spurring and facet hypertrophy. There is asymmetric facet hypertrophy on the right at C2-3. Up to mild foraminal narrowing at multiple levels. No large disc herniation or high-grade spinal stenosis identified. Upper chest: Clear lung apices. Other: Bilateral carotid atherosclerosis. Small calcified right cervical lymph nodes. IMPRESSION: 1. No evidence of acute cervical spine fracture, traumatic subluxation or static signs of instability. 2. Multilevel cervical spondylosis as described. Electronically Signed   By: Elsie Perone M.D.   On: 12/30/2023 14:14   CT Lumbar Spine Wo Contrast Result Date: 12/30/2023 CLINICAL DATA:  Back trauma. Mechanical fall prior to admission at home. Low back pain. EXAM: CT LUMBAR SPINE WITHOUT CONTRAST TECHNIQUE: Multidetector CT imaging of the lumbar spine was performed without intravenous contrast administration. Multiplanar CT image reconstructions were also generated. RADIATION DOSE REDUCTION: This exam was performed according to the departmental dose-optimization program which includes automated exposure control, adjustment of the mA and/or kV according to  patient size and/or use of iterative reconstruction technique. COMPARISON:  Lumbar spine radiographs 01/05/2019, lumbar MRI 02/28/2017 and lumbar CT discogram 03/23/2007 FINDINGS: Segmentation: There are 5 lumbar type vertebral bodies. Alignment: Physiologic. Vertebrae: There are stable remote postsurgical changes from previous L3 through S1 PLIF. The hardware appears intact, without loosening. There is solid interbody fusion at each level. There are nonacute fractures involving the right L1 and L2 transverse processes. No evidence of acute fracture or traumatic subluxation. Paraspinal and other soft tissues: No acute paraspinal findings. Aortic and branch vessel atherosclerosis. Fatty atrophy of the erector spinae musculature inferiorly. Disc levels: No significant disc space findings demonstrated within the visualized lower thoracic spine. At L1-2, there is progressive loss of disc height with annular disc bulging, endplate osteophytes and vacuum phenomenon. Mild bilateral facet hypertrophy. Resulting mild spinal stenosis without significant foraminal narrowing. L2-3: Stable postsurgical changes from previous left laminectomy. Progressive loss of disc height with annular disc bulging, endplate osteophytes and vacuum phenomenon. Mild bilateral facet hypertrophy. No significant spinal stenosis. Mild foraminal narrowing bilaterally. No acute findings are demonstrated at the operative levels status post L3 through S1 fusion. The spinal canal is decompressed. There is mild chronic osseous foraminal narrowing on the left at L4-5. IMPRESSION: 1. No evidence of acute lumbar spine fracture, traumatic subluxation or static signs of instability. Nonacute fractures of the right L1 and L2 transverse processes. 2. Stable remote postsurgical changes from previous L3 through S1 PLIF. 3. 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 No high-grade  spinal stenosis or definite nerve root encroachment. 4.  Aortic  Atherosclerosis (ICD10-I70.0). Electronically Signed   By: Elsie Perone M.D.   On: 12/30/2023 14:10   DG Chest Port 1 View Result Date: 12/30/2023 CLINICAL DATA:  Status post fall. EXAM: PORTABLE CHEST 1 VIEW COMPARISON:  June 10, 2023 FINDINGS: The cardiac silhouette is mildly enlarged and unchanged in size. There is marked severity calcification of the aortic arch. No acute infiltrate, pleural effusion or pneumothorax is identified. The visualized skeletal structures are unremarkable. IMPRESSION: No active disease. Electronically Signed   By: Suzen Dials M.D.   On: 12/30/2023 13:34   DG Wrist Complete Right Result Date: 12/30/2023 EXAM: 3 or more VIEW(S) XRAY OF THE RIGHT WRIST 12/30/2023 01:04:00 PM COMPARISON: None available. CLINICAL HISTORY: fall, wrist pain and swelling. Pt arrives by [facility] due to mechanical fall pta at home. Pt has swelling and pain to right wrist which was splinted by paramedics. Pt also has pain to lower back and stuck head. No LOC. No blood thinners, takes 81mg  aspirin  FINDINGS: BONES AND JOINTS: Mildly displaced distal right radial fracture is noted. Severe osteoarthritis is seen involving the first carpometacarpal joint. Calcification of triangular fibrocartilage is noted. No focal osseous lesion. No joint dislocation. SOFT TISSUES: The soft tissues are unremarkable. IMPRESSION: 1. Mildly displaced distal right radial fracture. 2. Severe osteoarthritis of the first carpometacarpal joint. 3. Calcification of the triangular fibrocartilage complex (chondrocalcinosis). Electronically signed by: Lynwood Seip MD 12/30/2023 01:32 PM EDT RP Workstation: [id number]   DG Pelvis Portable Result Date: 12/30/2023 CLINICAL DATA:  Status post fall. EXAM: PORTABLE PELVIS 1-2 VIEWS COMPARISON:  January 05, 2019 FINDINGS: There is no evidence of pelvic fracture or diastasis. No pelvic bone lesions are seen. Degenerative changes are seen involving both hips in the form of joint space  narrowing and acetabular sclerosis. Postoperative changes are noted within the lumbar spine. IMPRESSION: No acute osseous abnormality. Electronically Signed   By: Suzen Dials M.D.   On: 12/30/2023 13:32     The total time spent in the appointment was 55 minutes encounter with patients including review of chart and various tests results, discussions about plan of care and coordination of care plan   All questions were answered. The patient knows to call the clinic with any problems, questions or concerns. No barriers to learning was detected.  Olam JINNY Brunner, NP 10/15/202510:15 AM  Attending Note  I personally saw the patient, reviewed the chart and examined the patient. The plan of care was discussed with the patient and the admitting team. I agree with the assessment and plan as documented above. Thank you very much for the consultation. We discussed the imaging findings with the family and pt. Ms Donzell her daughter was at the bedside. We reviewed the imaging findings. I expressed that there is concern that this could be primary colon cancer with hepatic mets but we can't be certain without biopsy. Pt seemed to be ok with biopsy, but the daughter is worried about this because, daughter says mom wont remember anything she said to you. Ms Donzell said her brother Arley is going through treatment for advanced esophageal cancer and she wouldn't want mom to go through anything like that She will talk to her brothers and keep the primary team posted about proceeding with biopsy If biopsy not pursued, I think its reasonable to engage palliative care If biopsy pursued, please give us  a call when path results are available.

## 2023-12-31 NOTE — Plan of Care (Signed)

## 2023-12-31 NOTE — TOC Initial Note (Signed)
 Transition of Care [facility]) - Initial/Assessment Note    Patient Details  Name: [name] MRN: [id number] Date of Birth: 08/14/1929  Transition of [facility]) CM/SW Contact:    Bascom Service, RN Phone Number: 12/31/2023, 9:31 AM  Clinical Narrative: Dementia. Spoke to Anita(dtr) about d/c plans-Home.prefer a w/c. Has home 02-travel tank.Await PT eval & recc.                  Expected Discharge Plan: Home w Home Health Services Barriers to Discharge: Continued Medical Work up   Patient Goals and CMS Choice Patient states their goals for this hospitalization and ongoing recovery are:: Home CMS Medicare.gov Compare Post Acute Care list provided to:: Patient Represenative (must comment) (Anita(dtr)) Choice offered to / list presented to : Adult Children [place] ownership interest in [facility].provided to:: Adult Children    Expected Discharge Plan and Services   Discharge Planning Services: CM Consult Post Acute Care Choice: Home Health, Durable Medical Equipment Living arrangements for the past 2 months: Single Family Home                                      Prior Living Arrangements/Services Living arrangements for the past 2 months: Single Family Home Lives with:: Adult Children   Do you feel safe going back to the place where you live?: Yes          Current home services: DME (Active w/[employer] 02,rw,3n1)    Activities of Daily Living   ADL Screening (condition at time of admission) Independently performs ADLs?: Yes (appropriate for developmental age) Is the patient deaf or have difficulty hearing?: Yes Does the patient have difficulty seeing, even when wearing glasses/contacts?: No Does the patient have difficulty concentrating, remembering, or making decisions?: No  Permission Sought/Granted Permission sought to share information with : Case Manager Permission granted to share information with : Yes, Verbal Permission Granted               Emotional Assessment              Admission diagnosis:  Near syncope [R55] Fall, initial encounter [W19.XXXA] Closed fracture of distal end of right radius, unspecified fracture morphology, initial encounter [S52.501A] Patient Active Problem List   Diagnosis Date Noted   Near syncope 12/30/2023   Hypotension 12/30/2023   Distal radius fracture, right 12/30/2023   Liver lesion 12/30/2023   UTI (urinary tract infection) 12/30/2023   Age-related cognitive decline 09/29/2022   RLS (restless legs syndrome) 08/30/2021   Chronic constipation 08/30/2021   Cervicalgia 08/30/2021   Immunocompromised 09/01/2020   Acute vulvitis 07/31/2018   Mixed stress and urge urinary incontinence 09/04/2017   Elevated hemoglobin 04/04/2017   Chronic back pain 01/28/2017   Morbid obesity (HCC) 01/28/2017   Peptic ulcer disease    Lumbar radiculitis    Frequent falls 08/02/2016   Hypertensive retinopathy of both eyes 09/07/2015   Medicare annual wellness visit, subsequent 07/11/2015   Long-term use of high-risk medication 07/11/2015   Osteopenia 04/11/2015   Depression with anxiety 04/11/2015   Giant cell arteritis (HCC) 03/28/2015   Polymyalgia rheumatica 03/16/2015   Chronic narcotic use 03/16/2015   Anemia, iron deficiency 07/14/2013   Asthma, moderate persistent 10/15/2012   Hypertriglyceridemia 12/27/2008   HYPERTENSION, BENIGN 12/27/2008   PCP:  Wendolyn Jenkins Jansky, MD Pharmacy:   [facility] DELIVERY - [place], [place] - [phone]  [address] [place] [address] Phone: [phone] Fax: [phone]  [facility] [phone] - [place], [place] - [address] [place] [address] Phone: [phone] Fax: [phone]  [facility] [place] [address] [place] [address] Phone: [phone] Fax: [phone]     Social Drivers of Health  (SDOH) Social History: SDOH Screenings   Food Insecurity: No Food Insecurity (12/31/2023)  Housing: Low Risk  (12/31/2023)  Transportation Needs: No Transportation Needs (12/31/2023)  Utilities: Not At Risk (12/31/2023)  Alcohol Screen: Low Risk  (08/26/2023)  Depression (PHQ2-9): Medium Risk (09/30/2023)  Financial Resource Strain: Low Risk  (08/26/2023)  Physical Activity: Insufficiently Active (08/26/2023)  Social Connections: Socially Isolated (12/31/2023)  Stress: No Stress Concern Present (08/26/2023)  Tobacco Use: Low Risk  (12/30/2023)  Health Literacy: Adequate Health Literacy (08/26/2023)   SDOH Interventions:     Readmission Risk Interventions     No data to display

## 2023-12-31 NOTE — Progress Notes (Signed)
 Orthopedic Tech Progress Note Patient Details:  [name] 05-Aug-1929 [id number]  Ortho Devices Type of Ortho Device: Ace wrap, Cotton web roll, Volar splint Ortho Device/Splint Location: rt volar applied Ortho Device/Splint Interventions: Ordered, Application, Adjustment   Post Interventions Patient Tolerated: Well Instructions Provided: Adjustment of device, Care of device  Waylan Thom Loving 12/31/2023, 2:55 PM

## 2024-01-01 DIAGNOSIS — W19XXXA Unspecified fall, initial encounter: Secondary | ICD-10-CM | POA: Diagnosis not present

## 2024-01-01 DIAGNOSIS — M797 Fibromyalgia: Secondary | ICD-10-CM | POA: Diagnosis present

## 2024-01-01 DIAGNOSIS — I447 Left bundle-branch block, unspecified: Secondary | ICD-10-CM | POA: Diagnosis present

## 2024-01-01 DIAGNOSIS — I129 Hypertensive chronic kidney disease with stage 1 through stage 4 chronic kidney disease, or unspecified chronic kidney disease: Secondary | ICD-10-CM | POA: Diagnosis present

## 2024-01-01 DIAGNOSIS — N390 Urinary tract infection, site not specified: Secondary | ICD-10-CM | POA: Diagnosis present

## 2024-01-01 DIAGNOSIS — E890 Postprocedural hypothyroidism: Secondary | ICD-10-CM | POA: Diagnosis present

## 2024-01-01 DIAGNOSIS — R55 Syncope and collapse: Secondary | ICD-10-CM | POA: Diagnosis present

## 2024-01-01 DIAGNOSIS — E785 Hyperlipidemia, unspecified: Secondary | ICD-10-CM | POA: Diagnosis present

## 2024-01-01 DIAGNOSIS — W010XXA Fall on same level from slipping, tripping and stumbling without subsequent striking against object, initial encounter: Secondary | ICD-10-CM | POA: Diagnosis present

## 2024-01-01 DIAGNOSIS — N1831 Chronic kidney disease, stage 3a: Secondary | ICD-10-CM | POA: Diagnosis present

## 2024-01-01 DIAGNOSIS — S32029A Unspecified fracture of second lumbar vertebra, initial encounter for closed fracture: Secondary | ICD-10-CM | POA: Diagnosis present

## 2024-01-01 DIAGNOSIS — R160 Hepatomegaly, not elsewhere classified: Secondary | ICD-10-CM | POA: Diagnosis present

## 2024-01-01 DIAGNOSIS — J45909 Unspecified asthma, uncomplicated: Secondary | ICD-10-CM | POA: Diagnosis present

## 2024-01-01 DIAGNOSIS — G2581 Restless legs syndrome: Secondary | ICD-10-CM | POA: Diagnosis present

## 2024-01-01 DIAGNOSIS — Z23 Encounter for immunization: Secondary | ICD-10-CM | POA: Diagnosis present

## 2024-01-01 DIAGNOSIS — Z7951 Long term (current) use of inhaled steroids: Secondary | ICD-10-CM | POA: Diagnosis not present

## 2024-01-01 DIAGNOSIS — Z833 Family history of diabetes mellitus: Secondary | ICD-10-CM | POA: Diagnosis not present

## 2024-01-01 DIAGNOSIS — J9611 Chronic respiratory failure with hypoxia: Secondary | ICD-10-CM | POA: Diagnosis present

## 2024-01-01 DIAGNOSIS — F0393 Unspecified dementia, unspecified severity, with mood disturbance: Secondary | ICD-10-CM | POA: Diagnosis present

## 2024-01-01 DIAGNOSIS — S52501A Unspecified fracture of the lower end of right radius, initial encounter for closed fracture: Secondary | ICD-10-CM | POA: Diagnosis present

## 2024-01-01 DIAGNOSIS — K219 Gastro-esophageal reflux disease without esophagitis: Secondary | ICD-10-CM | POA: Diagnosis present

## 2024-01-01 DIAGNOSIS — C184 Malignant neoplasm of transverse colon: Secondary | ICD-10-CM | POA: Diagnosis present

## 2024-01-01 DIAGNOSIS — M353 Polymyalgia rheumatica: Secondary | ICD-10-CM | POA: Diagnosis present

## 2024-01-01 DIAGNOSIS — Z9981 Dependence on supplemental oxygen: Secondary | ICD-10-CM | POA: Diagnosis not present

## 2024-01-01 DIAGNOSIS — Z8249 Family history of ischemic heart disease and other diseases of the circulatory system: Secondary | ICD-10-CM | POA: Diagnosis not present

## 2024-01-01 DIAGNOSIS — I959 Hypotension, unspecified: Secondary | ICD-10-CM | POA: Diagnosis present

## 2024-01-01 DIAGNOSIS — S32019A Unspecified fracture of first lumbar vertebra, initial encounter for closed fracture: Secondary | ICD-10-CM | POA: Diagnosis present

## 2024-01-01 LAB — BASIC METABOLIC PANEL WITH GFR
Anion gap: 9 (ref 5–15)
BUN: 23 mg/dL (ref 8–23)
CO2: 25 mmol/L (ref 22–32)
Calcium: 8.8 mg/dL — ABNORMAL LOW (ref 8.9–10.3)
Chloride: 105 mmol/L (ref 98–111)
Creatinine, Ser: 1.27 mg/dL — ABNORMAL HIGH (ref 0.44–1.00)
GFR, Estimated: 39 mL/min — ABNORMAL LOW (ref >60)
Glucose, Bld: 126 mg/dL — ABNORMAL HIGH (ref 70–99)
Potassium: 4.9 mmol/L (ref 3.5–5.1)
Sodium: 139 mmol/L (ref 135–145)

## 2024-01-01 LAB — AFP TUMOR MARKER: AFP, Serum, Tumor Marker: <1.8 ng/mL (ref 0.0–8.7)

## 2024-01-01 LAB — CBC
HCT: 37 % (ref 36.0–46.0)
Hemoglobin: 11.6 g/dL — ABNORMAL LOW (ref 12.0–15.0)
MCH: 31.5 pg (ref 26.0–34.0)
MCHC: 31.4 g/dL (ref 30.0–36.0)
MCV: 100.5 fL — ABNORMAL HIGH (ref 80.0–100.0)
Platelets: 118 K/uL — ABNORMAL LOW (ref 150–400)
RBC: 3.68 MIL/uL — ABNORMAL LOW (ref 3.87–5.11)
RDW: 13.3 % (ref 11.5–15.5)
WBC: 12.8 K/uL — ABNORMAL HIGH (ref 4.0–10.5)
nRBC: 0 % (ref 0.0–0.2)

## 2024-01-01 MED ORDER — GUAIFENESIN-DM 100-10 MG/5ML PO SYRP: 5.0000 mL | ORAL_SOLUTION | ORAL | Status: DC | PRN

## 2024-01-01 MED ORDER — PREDNISONE 5 MG PO TABS
5.0000 mg | ORAL_TABLET | Freq: Every day | ORAL | Status: DC
  Administered 2024-01-01 – 2024-01-03 (×3): 5 mg via ORAL
  Filled 2024-01-01 (×3): qty 1

## 2024-01-01 MED ORDER — SENNOSIDES-DOCUSATE SODIUM 8.6-50 MG PO TABS
1.0000 | ORAL_TABLET | Freq: Two times a day (BID) | ORAL | Status: DC
  Administered 2024-01-01 – 2024-01-03 (×5): 1 via ORAL
  Filled 2024-01-01 (×5): qty 1

## 2024-01-01 MED ORDER — POLYETHYLENE GLYCOL 3350 17 G PO PACK
17.0000 g | PACK | Freq: Two times a day (BID) | ORAL | Status: DC
  Administered 2024-01-01 – 2024-01-03 (×4): 17 g via ORAL
  Filled 2024-01-01 (×4): qty 1

## 2024-01-01 NOTE — Progress Notes (Signed)
 Orthopedic Tech Progress Note Patient Details:  Brandace Sobotka May 25, 1929 [id number]  Patient ID: Teala Agerton, female   DOB: 10/19/1929, 89 y.o.   MRN: [id number]  Waylan Thom Loving 01/01/2024, 4:57 PM Tlso not applied. Patient very distended . Will hold and wait unti PT gets patient OOB

## 2024-01-01 NOTE — Progress Notes (Addendum)
 PROGRESS NOTE    [name]  FMW:[id number] DOB: 09-11-1929 DOA: 12/30/2023 PCP: Wendolyn Jenkins Jansky, MD   Brief Narrative: -year-old with past medical dementia, asthma, chronic hypoxic respite failure on hypertension, CKD stage IIIa, depression, GCA, polymyalgia rheumatica on Actemra  infusion, RLS, GERD, B12 deficiency presented for evaluation to the ED after a ground-level fall.  Patient lost her balance and fell backward striking her head.  She landed on her right wrist and complaining of pain.  Imaging notable mildly displaced distal right radial fracture, nonacute right L1-L2 transverse process fracture and medial heterogeneous lesion in the right lobe of the liver measuring 4.9 x 3.1 cm, CT) showed wall thickening of the right transverse colon with a small regional lymph nodes concerning for malignancy.  Initial plan was to discharge patient but as she has been rather patient episode of near syncope and was found to be hypotensive with systolic blood pressure in the 70s.  She received IV fluids IV antibiotics.   Assessment & Plan:   Principal Problem:   Hypotension Active Problems:   Near syncope   Distal radius fracture, right   Liver lesion   UTI (urinary tract infection)  1-Hypotension, Near Syncope: -Plan to hold BP medications.  -received IV fluids -BP increasing, monitor.   Mild right radius fracture: L1 and L2 transverse process fractures,  -Wrist splint placed in the ED -pain management  -Ortho consulted, needs to follow up out patient.  Will order brace  Liver mass: Circumferential wall thickening of the proximal transverse colon  -Oncology consulted. Will follow recommendation for Biopsy  -Hepatitis panel negative,  -Alfa feto protein 1.8 Daughter is incline to not proceed with biopsy, she will discussed further with Dr Loretha  UTI, -Report Dysuria, UA 6-10 WBC -Follow urine culture. 100K gram negative rods.  -Continue with IV ceftriaxone .  -Follow up Blood  culture; no growth to date.  Leukocytosis, follow trend.   Prolonged QT, -Mg normal.  -Monitor.    Asthma Chronic hypoxic respiratory failure, -Continue with nebulizer.    Hypertension -Hold Norvasc . Monitor BP.   CKD 3A -Monitor renal function.   Early dementia: -Continue with Memantine , aripiprazole , bupropion , sertraline  and valium  PRN  RLS: -On Ropinirole .   Polymyalgia rheumatica: -on Monthly Actemra  last dose 12/09/2023 On prednisone  resume     Estimated body mass index is 31.78 kg/m as calculated from the following:   Height as of this encounter: 5' 1 (1.549 m).   Weight as of this encounter: 76.3 kg.   DVT prophylaxis: SCD Code Status: Full code Family Communication:Daughter over phone Disposition Plan:  Status is: Observation The patient remains OBS appropriate and will d/c before 2 midnights.    Consultants:  Oncology   Procedures:  none  Antimicrobials:    Subjective: She is doing well, report back pain with movement.    Objective: Vitals:   12/31/23 1010 12/31/23 1332 12/31/23 2231 01/01/24 0434  BP:  (!) 141/44 (!) 161/43 (!) 159/53  Pulse:  67 70 76  Resp:  20  (!) 21  Temp:  98.2 F (36.8 C) 98.7 F (37.1 C) 98.1 F (36.7 C)  TempSrc:  Oral  Oral  SpO2: 94% 100% 97% 96%  Weight:      Height:        Intake/Output Summary (Last 24 hours) at 01/01/2024 0731 Last data filed at 01/01/2024 0430 Gross per 24 hour  Intake 240 ml  Output 1300 ml  Net -1060 ml   Filed Weights   12/30/23 2100  Weight: 76.3 kg    Examination:  General exam: NAD Respiratory system: CTA Cardiovascular system: S 1, S 2 RRR Gastrointestinal system: BS present, soft, nt Central nervous system: alert Extremities: Symmetric 5 x 5 power. Right hand on splint.     Data Reviewed: I have personally reviewed following labs and imaging studies  CBC: Recent Labs  Lab 12/30/23 1308 12/31/23 0004 01/01/24 0517  WBC 15.3* 17.6* 12.8*  HGB  12.7 12.0 11.6*  HCT 38.3 37.2 37.0  MCV 93.6 97.9 100.5*  PLT 168 153 118*   Basic Metabolic Panel: Recent Labs  Lab 12/30/23 1308 12/31/23 0004 01/01/24 0517  NA 138  --  139  K 4.3  --  4.9  CL 102  --  105  CO2 28  --  25  GLUCOSE 105*  --  126*  BUN 18  --  23  CREATININE 1.10*  --  1.27*  CALCIUM  9.8  --  8.8*  MG  --  2.2  --    GFR: Estimated Creatinine Clearance: 26.4 mL/min (A) (by C-G formula based on SCr of 1.27 mg/dL (H)). Liver Function Tests: Recent Labs  Lab 12/30/23 1308  AST 36  ALT 24  ALKPHOS 98  BILITOT 0.4  PROT 6.0*  ALBUMIN 4.0   No results for input(s): LIPASE, AMYLASE in the last 168 hours. No results for input(s): AMMONIA in the last 168 hours. Coagulation Profile: Recent Labs  Lab 12/30/23 1308  INR 1.0   Cardiac Enzymes: No results for input(s): CKTOTAL, CKMB, CKMBINDEX, TROPONINI in the last 168 hours. BNP (last 3 results) No results for input(s): PROBNP in the last 8760 hours. HbA1C: No results for input(s): HGBA1C in the last 72 hours. CBG: No results for input(s): GLUCAP in the last 168 hours. Lipid Profile: No results for input(s): CHOL, HDL, LDLCALC, TRIG, CHOLHDL, LDLDIRECT in the last 72 hours. Thyroid  Function Tests: No results for input(s): TSH, T4TOTAL, FREET4, T3FREE, THYROIDAB in the last 72 hours. Anemia Panel: No results for input(s): VITAMINB12, FOLATE, FERRITIN, TIBC, IRON, RETICCTPCT in the last 72 hours. Sepsis Labs: Recent Labs  Lab 12/30/23 1824 12/30/23 2107  LATICACIDVEN 1.0 1.2    Recent Results (from the past 240 hours)  Blood culture (routine x 2)     Status: None (Preliminary result)   Collection Time: 12/30/23  6:10 PM   Specimen: BLOOD  Result Value Ref Range Status   Specimen Description   Final    BLOOD RIGHT ANTECUBITAL Performed at [facility], [address] [place], [place] [address]    Special Requests    Final    Blood Culture adequate volume BOTTLES DRAWN AEROBIC AND ANAEROBIC Performed at [facility], [address], [place], [place] [address]    Culture   Final    NO GROWTH 2 DAYS Performed at [facility], [address] [place], [place] [address]    Report Status PENDING  Incomplete  Blood culture (routine x 2)     Status: None (Preliminary result)   Collection Time: 12/30/23  6:15 PM   Specimen: BLOOD  Result Value Ref Range Status   Specimen Description   Final    BLOOD LEFT ANTECUBITAL Performed at [facility], [address] [place], [place] [address]    Special Requests   Final    Blood Culture adequate volume BOTTLES DRAWN AEROBIC AND ANAEROBIC Performed at [facility], [address] [place], [place] [address]    Culture  Final    NO GROWTH 2 DAYS Performed at [facility], [address], [place], [place] [address]    Report Status PENDING  Incomplete         Radiology Studies: CT CHEST ABDOMEN PELVIS W CONTRAST Result Date: 12/30/2023 EXAM: CT CHEST, ABDOMEN AND PELVIS WITH CONTRAST 12/30/2023 05:03:32 PM TECHNIQUE: CT of the chest, abdomen and pelvis was performed with the administration of 100 mL of iohexol  (OMNIPAQUE ) 300 MG/ML solution. Multiplanar reformatted images are provided for review. Automated exposure control, iterative reconstruction, and/or weight based adjustment of the mA/kV was utilized to reduce the radiation dose to as low as reasonably achievable. COMPARISON: CT angiogram chest 325 2000 and 25. CT abdomen and pelvis 106 2012. CLINICAL HISTORY: Polytrauma, blunt. Triage note: Pt arrives by [facility] due to mechanical fall pta at home. Pt has swelling and pain to right wrist which was splinted by paramedics. Pt also has pain to lower back and stuck head. No LOC. No blood thinners, takes 81mg  aspirin . FINDINGS: CHEST: MEDIASTINUM AND LYMPH NODES: Atherosclerotic calcifications  at the aorta and coronary arteries. The heart is mildly enlarged. There is no mediastinal hematoma or pneumomediastinum. The central airways are clear. No mediastinal, hilar or axillary lymphadenopathy. LUNGS AND PLEURA: Minimal atelectasis in the lingula. Minimal tree-in-bud opacities in the bilateral upper lobes similar to prior study. A few scattered 2 mm nodular densities, unchanged. No focal consolidation or pulmonary edema. No pleural effusion or pneumothorax. ABDOMEN AND PELVIS: LIVER: There is a new heterogeneous hypodense lesion in the right lobe of the liver measuring 4.9x3.1 cm with enhancing septations. Additional scattered subcentimeter rounded hypodensities are similar to prior, likely cysts or hemangiomas. Calcified granulomas in the liver. No liver laceration or subcapsular fluid collection. GALLBLADDER AND BILE DUCTS: Gallbladder is unremarkable. No biliary ductal dilatation. SPLEEN: No acute abnormality. PANCREAS: No acute abnormality. ADRENAL GLANDS: No acute abnormality. KIDNEYS, URETERS AND BLADDER: No stones in the kidneys or ureters. No hydronephrosis. No perinephric or periureteral stranding. Urinary bladder is unremarkable. GI AND BOWEL: Stomach demonstrates no acute abnormality. Circumferential wall thickening of the proximal transverse colon without surrounding inflammation. There are small surrounding lymph nodes. Appendix appears normal. Sigmoid colon diverticulosis. There is no bowel obstruction. REPRODUCTIVE ORGANS: Uterus is surgically absent. PERITONEUM AND RETROPERITONEUM: No ascites. No free air. VASCULATURE: Aorta is normal in caliber. Atherosclerotic calcifications throughout the aorta. ABDOMINAL AND PELVIS LYMPH NODES: No lymphadenopathy. BONES AND SOFT TISSUES: Stable chronic compression deformity of T5. Healing right L1 and L2 transverse process fractures which are new from prior. L3-S1 posterior fusion hardware present. No acute fractures are identified. Chronic right hip  joint effusion appears unchanged. No focal soft tissue abnormality. IMPRESSION: 1. No acute posttraumatic sequelae in the chest, abdomen, or pelvis. 2. New heterogeneous lesion in the right lobe of the liver measuring 4.9 x 3.1 cm with enhancing septations. Findings are concerning for a primary neoplasm, metastatic disease, or infection in the appropriate clinical setting. 3. Circumferential wall thickening of the proximal transverse colon with small regional lymph nodes. No bowel obstruction. Malignancy is not excluded. Recommend follow-up direct visualization. 4. Chronic tree-in-bud opacities in the upper lobes appear unchanged, likely infectious/inflammatory. Electronically signed by: Greig Pique MD 12/30/2023 05:25 PM EDT RP Workstation: [id number]   CT HEAD WO CONTRAST Result Date: 12/30/2023 EXAM: CT HEAD WITHOUT CONTRAST 12/30/2023 01:57:54 PM TECHNIQUE: CT of the head was performed without the administration of intravenous contrast. Automated exposure control, iterative reconstruction, and/or weight based adjustment of the mA/kV  was utilized to reduce the radiation dose to as low as reasonably achievable. COMPARISON: Head CT 08/03/2021 and MRI 02/25/2020. CLINICAL HISTORY: Head trauma, moderate-severe. Mechanical fall, struck head. No loss of consciousness (LOC). Takes 81mg  aspirin . FINDINGS: BRAIN AND VENTRICLES: There is no evidence of an acute infarct, intracranial hemorrhage, mass, midline shift, hydrocephalus, or extra-axial fluid collection. Cerebral atrophy is within normal limits for age. Patchy cerebral white matter hypodensities are similar to the prior CT and are nonspecific but compatible with mild to moderate chronic small vessel ischemic disease. Calcified atherosclerosis at the skull base. ORBITS: Bilateral cataract extraction. SINUSES: No acute abnormality. SOFT TISSUES AND SKULL: No acute soft tissue abnormality. No skull fracture. IMPRESSION: 1. No acute intracranial abnormality. 2.  Mild to moderate chronic small vessel ischemic disease. Electronically signed by: Dasie Hamburg MD 12/30/2023 02:29 PM EDT RP Workstation: [id number]   CT CERVICAL SPINE WO CONTRAST Result Date: 12/30/2023 CLINICAL DATA:  Polytrauma, blunt Mechanical fall at home.  Head injury. EXAM: CT CERVICAL SPINE WITHOUT CONTRAST TECHNIQUE: Multidetector CT imaging of the cervical spine was performed without intravenous contrast. Multiplanar CT image reconstructions were also generated. RADIATION DOSE REDUCTION: This exam was performed according to the departmental dose-optimization program which includes automated exposure control, adjustment of the mA and/or kV according to patient size and/or use of iterative reconstruction technique. COMPARISON:  Cervical spine radiographs 03/14/2020. FINDINGS: Alignment: Straightening with a minimal degenerative anterolisthesis at C2-3 and minimal retrolisthesis at C4-5. Skull base and vertebrae: No evidence of acute cervical spine fracture or traumatic subluxation. Soft tissues and spinal canal: No prevertebral fluid or swelling. No visible canal hematoma. Disc levels: Multilevel spondylosis with disc space narrowing, uncinate spurring and facet hypertrophy. There is asymmetric facet hypertrophy on the right at C2-3. Up to mild foraminal narrowing at multiple levels. No large disc herniation or high-grade spinal stenosis identified. Upper chest: Clear lung apices. Other: Bilateral carotid atherosclerosis. Small calcified right cervical lymph nodes. IMPRESSION: 1. No evidence of acute cervical spine fracture, traumatic subluxation or static signs of instability. 2. Multilevel cervical spondylosis as described. Electronically Signed   By: Elsie Perone M.D.   On: 12/30/2023 14:14   CT Lumbar Spine Wo Contrast Result Date: 12/30/2023 CLINICAL DATA:  Back trauma. Mechanical fall prior to admission at home. Low back pain. EXAM: CT LUMBAR SPINE WITHOUT CONTRAST TECHNIQUE: Multidetector  CT imaging of the lumbar spine was performed without intravenous contrast administration. Multiplanar CT image reconstructions were also generated. RADIATION DOSE REDUCTION: This exam was performed according to the departmental dose-optimization program which includes automated exposure control, adjustment of the mA and/or kV according to patient size and/or use of iterative reconstruction technique. COMPARISON:  Lumbar spine radiographs 01/05/2019, lumbar MRI 02/28/2017 and lumbar CT discogram 03/23/2007 FINDINGS: Segmentation: There are 5 lumbar type vertebral bodies. Alignment: Physiologic. Vertebrae: There are stable remote postsurgical changes from previous L3 through S1 PLIF. The hardware appears intact, without loosening. There is solid interbody fusion at each level. There are nonacute fractures involving the right L1 and L2 transverse processes. No evidence of acute fracture or traumatic subluxation. Paraspinal and other soft tissues: No acute paraspinal findings. Aortic and branch vessel atherosclerosis. Fatty atrophy of the erector spinae musculature inferiorly. Disc levels: No significant disc space findings demonstrated within the visualized lower thoracic spine. At L1-2, there is progressive loss of disc height with annular disc bulging, endplate osteophytes and vacuum phenomenon. Mild bilateral facet hypertrophy. Resulting mild spinal stenosis without significant foraminal narrowing. L2-3: Stable postsurgical changes from previous  left laminectomy. Progressive loss of disc height with annular disc bulging, endplate osteophytes and vacuum phenomenon. Mild bilateral facet hypertrophy. No significant spinal stenosis. Mild foraminal narrowing bilaterally. No acute findings are demonstrated at the operative levels status post L3 through S1 fusion. The spinal canal is decompressed. There is mild chronic osseous foraminal narrowing on the left at L4-5. IMPRESSION: 1. No evidence of acute lumbar spine  fracture, traumatic subluxation or static signs of instability. Nonacute fractures of the right L1 and L2 transverse processes. 2. Stable remote postsurgical changes from previous L3 through S1 PLIF. 3. 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 No high-grade spinal stenosis or definite nerve root encroachment. 4.  Aortic Atherosclerosis (ICD10-I70.0). Electronically Signed   By: Elsie Perone M.D.   On: 12/30/2023 14:10   DG Chest Port 1 View Result Date: 12/30/2023 CLINICAL DATA:  Status post fall. EXAM: PORTABLE CHEST 1 VIEW COMPARISON:  June 10, 2023 FINDINGS: The cardiac silhouette is mildly enlarged and unchanged in size. There is marked severity calcification of the aortic arch. No acute infiltrate, pleural effusion or pneumothorax is identified. The visualized skeletal structures are unremarkable. IMPRESSION: No active disease. Electronically Signed   By: Suzen Dials M.D.   On: 12/30/2023 13:34   DG Wrist Complete Right Result Date: 12/30/2023 EXAM: 3 or more VIEW(S) XRAY OF THE RIGHT WRIST 12/30/2023 01:04:00 PM COMPARISON: None available. CLINICAL HISTORY: fall, wrist pain and swelling. Pt arrives by [facility] due to mechanical fall pta at home. Pt has swelling and pain to right wrist which was splinted by paramedics. Pt also has pain to lower back and stuck head. No LOC. No blood thinners, takes 81mg  aspirin  FINDINGS: BONES AND JOINTS: Mildly displaced distal right radial fracture is noted. Severe osteoarthritis is seen involving the first carpometacarpal joint. Calcification of triangular fibrocartilage is noted. No focal osseous lesion. No joint dislocation. SOFT TISSUES: The soft tissues are unremarkable. IMPRESSION: 1. Mildly displaced distal right radial fracture. 2. Severe osteoarthritis of the first carpometacarpal joint. 3. Calcification of the triangular fibrocartilage complex (chondrocalcinosis). Electronically signed by: Lynwood Seip MD 12/30/2023 01:32 PM  EDT RP Workstation: [id number]   DG Pelvis Portable Result Date: 12/30/2023 CLINICAL DATA:  Status post fall. EXAM: PORTABLE PELVIS 1-2 VIEWS COMPARISON:  January 05, 2019 FINDINGS: There is no evidence of pelvic fracture or diastasis. No pelvic bone lesions are seen. Degenerative changes are seen involving both hips in the form of joint space narrowing and acetabular sclerosis. Postoperative changes are noted within the lumbar spine. IMPRESSION: No acute osseous abnormality. Electronically Signed   By: Suzen Dials M.D.   On: 12/30/2023 13:32        Scheduled Meds:  arformoterol  15 mcg Nebulization BID   ARIPiprazole   2 mg Oral Daily   budesonide  (PULMICORT ) nebulizer solution  0.25 mg Nebulization BID   buPROPion   150 mg Oral Daily   Influenza vac split trivalent PF  0.5 mL Intramuscular Tomorrow-1000   ipratropium-albuterol   3 mL Nebulization Q6H   memantine   5 mg Oral BID   pantoprazole  40 mg Oral Daily   polyethylene glycol  17 g Oral Daily   rOPINIRole   1 mg Oral Daily   rOPINIRole   2 mg Oral QHS   sertraline   50 mg Oral Daily   Continuous Infusions:  cefTRIAXone  (ROCEPHIN )  IV 1 g (12/31/23 1748)   lactated ringers  75 mL/hr at 12/31/23 2124     LOS: 0 days    Time spent: 35  minutes    Owen DELENA Lore, MD [facility]   If 7PM-7AM, please contact night-coverage [email or web address]  01/01/2024, 7:31 AM

## 2024-01-01 NOTE — TOC Progression Note (Signed)
 Transition of Care [facility]) - Progression Note    Patient Details  Name: [name] MRN: [id number] Date of Birth: October 21, 1929  Transition of Care [facility]) CM/SW Contact  Kalix Meinecke, Nathanel, RN Phone Number: 01/01/2024, 9:42 AM  Clinical Narrative: Await dtr's choice for [facility] agency for HHPT/OT/adie-declines ST SNF. Has own transport home.    Service Provider Request Status Stars Address Phone   [facility])  Accepted 4 [address] [place] [address], [place] [address] [phone]  [facility] - [facility])  Accepted 3 [address], [place] [address] [phone]           Expected Discharge Plan: Home w Home Health Services Barriers to Discharge: Continued Medical Work up               Expected Discharge Plan and Services   Discharge Planning Services: CM Consult Post Acute Care Choice: Home Health Living arrangements for the past 2 months: Single Family Home                           HH Arranged: PT, OT, Nurse's Aide           Social Drivers of Health (SDOH) Interventions SDOH Screenings   Food Insecurity: No Food Insecurity (12/31/2023)  Housing: Low Risk  (12/31/2023)  Transportation Needs: No Transportation Needs (12/31/2023)  Utilities: Not At Risk (12/31/2023)  Alcohol Screen: Low Risk  (08/26/2023)  Depression (PHQ2-9): Medium Risk (09/30/2023)  Financial Resource Strain: Low Risk  (08/26/2023)  Physical Activity: Insufficiently Active (08/26/2023)  Social Connections: Socially Isolated (12/31/2023)  Stress: No Stress Concern Present (08/26/2023)  Tobacco Use: Low Risk  (12/30/2023)  Health Literacy: Adequate Health Literacy (08/26/2023)    Readmission Risk Interventions     No data to display

## 2024-01-01 NOTE — Care Management Obs Status (Signed)
 MEDICARE OBSERVATION STATUS NOTIFICATION   Patient Details  Name: [name] MRN: [id number] Date of Birth: [birth year]   Medicare Observation Status Notification Given:  Yes    MahabirNathanel, RN 01/01/2024, 9:01 AM

## 2024-01-02 DIAGNOSIS — W19XXXA Unspecified fall, initial encounter: Secondary | ICD-10-CM | POA: Diagnosis not present

## 2024-01-02 DIAGNOSIS — N390 Urinary tract infection, site not specified: Secondary | ICD-10-CM | POA: Diagnosis not present

## 2024-01-02 LAB — BASIC METABOLIC PANEL WITH GFR
Anion gap: 7 (ref 5–15)
BUN: 28 mg/dL — ABNORMAL HIGH (ref 8–23)
CO2: 28 mmol/L (ref 22–32)
Calcium: 9 mg/dL (ref 8.9–10.3)
Chloride: 104 mmol/L (ref 98–111)
Creatinine, Ser: 1.43 mg/dL — ABNORMAL HIGH (ref 0.44–1.00)
GFR, Estimated: 34 mL/min — ABNORMAL LOW (ref >60)
Glucose, Bld: 129 mg/dL — ABNORMAL HIGH (ref 70–99)
Potassium: 5 mmol/L (ref 3.5–5.1)
Sodium: 139 mmol/L (ref 135–145)

## 2024-01-02 LAB — CBC
HCT: 35 % — ABNORMAL LOW (ref 36.0–46.0)
Hemoglobin: 11.3 g/dL — ABNORMAL LOW (ref 12.0–15.0)
MCH: 31.7 pg (ref 26.0–34.0)
MCHC: 32.3 g/dL (ref 30.0–36.0)
MCV: 98 fL (ref 80.0–100.0)
Platelets: 127 K/uL — ABNORMAL LOW (ref 150–400)
RBC: 3.57 MIL/uL — ABNORMAL LOW (ref 3.87–5.11)
RDW: 13.2 % (ref 11.5–15.5)
WBC: 13.4 K/uL — ABNORMAL HIGH (ref 4.0–10.5)
nRBC: 0 % (ref 0.0–0.2)

## 2024-01-02 LAB — CEA: CEA: 7 ng/mL — ABNORMAL HIGH (ref 0.0–4.7)

## 2024-01-02 MED ORDER — IPRATROPIUM-ALBUTEROL 0.5-2.5 (3) MG/3ML IN SOLN
3.0000 mL | Freq: Three times a day (TID) | RESPIRATORY_TRACT | Status: DC
Start: 2024-01-03 — End: 2024-01-03
  Administered 2024-01-03 (×2): 3 mL via RESPIRATORY_TRACT
  Filled 2024-01-02 (×2): qty 3

## 2024-01-02 MED ORDER — LACTATED RINGERS IV SOLN: INTRAVENOUS | Status: DC

## 2024-01-02 MED ORDER — BISACODYL 10 MG RE SUPP
10.0000 mg | Freq: Once | RECTAL | Status: AC
  Administered 2024-01-02: 10 mg via RECTAL
  Filled 2024-01-02: qty 1

## 2024-01-02 MED ORDER — FLEET ENEMA RE ENEM: 1.0000 | ENEMA | Freq: Every day | RECTAL | Status: DC | PRN

## 2024-01-02 NOTE — Progress Notes (Signed)
 PROGRESS NOTE    [name]  FMW:[id number] DOB: December 30, 1929 DOA: 12/30/2023 PCP: Wendolyn Jenkins Jansky, MD   Brief Narrative: -year-old with past medical dementia, asthma, chronic hypoxic respite failure on hypertension, CKD stage IIIa, depression, GCA, polymyalgia rheumatica on Actemra  infusion, RLS, GERD, B12 deficiency presented for evaluation to the ED after a ground-level fall.  Patient lost her balance and fell backward striking her head.  She landed on her right wrist and complaining of pain.  Imaging notable mildly displaced distal right radial fracture, nonacute right L1-L2 transverse process fracture and medial heterogeneous lesion in the right lobe of the liver measuring 4.9 x 3.1 cm, CT) showed wall thickening of the right transverse colon with a small regional lymph nodes concerning for malignancy.  Initial plan was to discharge patient but as she has been rather patient episode of near syncope and was found to be hypotensive with systolic blood pressure in the 70s.  She received IV fluids IV antibiotics.   Assessment & Plan:   Principal Problem:   Hypotension Active Problems:   Near syncope   Distal radius fracture, right   Liver lesion   UTI (urinary tract infection)  1-Hypotension, Near Syncope: -Plan to hold BP medications.  -received IV fluids -BP increasing, monitor.   Mild right radius fracture: L1 and L2 transverse process fractures,  -Wrist splint placed in the ED -pain management  -Ortho consulted, needs to follow up out patient.  TLS brace  Liver mass: Circumferential wall thickening of the proximal transverse colon  -Oncology consulted. Will follow recommendation for Biopsy  -Hepatitis panel negative,  -Alfa feto protein 1.8 Plan is to proceed with liver biopsy, they will continue to follow-up outpatient with Dr. Loretha  UTI, -Report Dysuria, UA 6-10 WBC -Follow urine culture. 100K gram negative rods.  -Continue with IV ceftriaxone .  -Follow up Blood  culture; no growth to date.  Leukocytosis, follow trend.   Prolonged QT, -Mg normal.  -Monitor.    Asthma Chronic hypoxic respiratory failure, -Continue with nebulizer.    Hypertension -Hold Norvasc . Monitor BP.   CKD 3A -Monitor renal function.  Mildly increased creatinine will order low rate IV fluid Early dementia: -Continue with Memantine , aripiprazole , bupropion , sertraline  and valium  PRN  RLS: -On Ropinirole .   Polymyalgia rheumatica: -on Monthly Actemra  last dose 12/09/2023 On prednisone  resume  Constipation: Continue with MiraLAX.  Will order Dulcolax suppository   Estimated body mass index is 31.78 kg/m as calculated from the following:   Height as of this encounter: 5' 1 (1.549 m).   Weight as of this encounter: 76.3 kg.   DVT prophylaxis: SCD Code Status: Full code Family Communication:Daughter over phone Disposition Plan:  Status is: Observation The patient remains OBS appropriate and will d/c before 2 midnights.    Consultants:  Oncology   Procedures:  none  Antimicrobials:    Subjective: She still having back pain with movement, she is also constipated.  Objective: Vitals:   01/01/24 1943 01/01/24 2112 01/02/24 0105 01/02/24 0421  BP:  (!) 129/45  (!) 139/47  Pulse:  60  61  Resp:  18  18  Temp:  98.5 F (36.9 C)  97.7 F (36.5 C)  TempSrc:    Oral  SpO2: 98% 99% 98% 100%  Weight:      Height:        Intake/Output Summary (Last 24 hours) at 01/02/2024 0730 Last data filed at 01/02/2024 0700 Gross per 24 hour  Intake 560 ml  Output 850 ml  Net -290 ml   Filed Weights   12/30/23 2100  Weight: 76.3 kg    Examination:  General exam: No acute distress Respiratory system: Clear to auscultation Cardiovascular system: S1-S2 regular rhythm and rate Gastrointestinal system: Bowel sounds present, soft nontender nondistended Central nervous system: Alert following commands   Data Reviewed: I have personally reviewed  following labs and imaging studies  CBC: Recent Labs  Lab 12/30/23 1308 12/31/23 0004 01/01/24 0517  WBC 15.3* 17.6* 12.8*  HGB 12.7 12.0 11.6*  HCT 38.3 37.2 37.0  MCV 93.6 97.9 100.5*  PLT 168 153 118*   Basic Metabolic Panel: Recent Labs  Lab 12/30/23 1308 12/31/23 0004 01/01/24 0517  NA 138  --  139  K 4.3  --  4.9  CL 102  --  105  CO2 28  --  25  GLUCOSE 105*  --  126*  BUN 18  --  23  CREATININE 1.10*  --  1.27*  CALCIUM  9.8  --  8.8*  MG  --  2.2  --    GFR: Estimated Creatinine Clearance: 26.4 mL/min (A) (by C-G formula based on SCr of 1.27 mg/dL (H)). Liver Function Tests: Recent Labs  Lab 12/30/23 1308  AST 36  ALT 24  ALKPHOS 98  BILITOT 0.4  PROT 6.0*  ALBUMIN 4.0   No results for input(s): LIPASE, AMYLASE in the last 168 hours. No results for input(s): AMMONIA in the last 168 hours. Coagulation Profile: Recent Labs  Lab 12/30/23 1308  INR 1.0   Cardiac Enzymes: No results for input(s): CKTOTAL, CKMB, CKMBINDEX, TROPONINI in the last 168 hours. BNP (last 3 results) No results for input(s): PROBNP in the last 8760 hours. HbA1C: No results for input(s): HGBA1C in the last 72 hours. CBG: No results for input(s): GLUCAP in the last 168 hours. Lipid Profile: No results for input(s): CHOL, HDL, LDLCALC, TRIG, CHOLHDL, LDLDIRECT in the last 72 hours. Thyroid  Function Tests: No results for input(s): TSH, T4TOTAL, FREET4, T3FREE, THYROIDAB in the last 72 hours. Anemia Panel: No results for input(s): VITAMINB12, FOLATE, FERRITIN, TIBC, IRON, RETICCTPCT in the last 72 hours. Sepsis Labs: Recent Labs  Lab 12/30/23 1824 12/30/23 2107  LATICACIDVEN 1.0 1.2    Recent Results (from the past 240 hours)  Urine Culture     Status: Abnormal (Preliminary result)   Collection Time: 12/30/23  4:26 PM   Specimen: Urine, Clean Catch  Result Value Ref Range Status   Specimen Description   Final     URINE, CLEAN CATCH Performed at [facility], [address], [place], [place] [address]    Special Requests   Final    NONE Performed at [facility], [address] [place], [place] [address]    Culture (A)  Final    >=100,000 COLONIES/mL GRAM NEGATIVE RODS IDENTIFICATION AND SUSCEPTIBILITIES TO FOLLOW Performed at [facility], [address], [place], [place] [address]    Report Status PENDING  Incomplete  Blood culture (routine x 2)     Status: None (Preliminary result)   Collection Time: 12/30/23  6:10 PM   Specimen: BLOOD  Result Value Ref Range Status   Specimen Description   Final    BLOOD RIGHT ANTECUBITAL Performed at [facility], [address], [place], [place] [address]    Special Requests   Final    Blood Culture adequate volume BOTTLES DRAWN AEROBIC AND ANAEROBIC Performed at [facility], [address] [place], [place]  [address]    Culture   Final    NO GROWTH 3 DAYS Performed at [facility], [address], [place], [place] [address]    Report Status PENDING  Incomplete  Blood culture (routine x 2)     Status: None (Preliminary result)   Collection Time: 12/30/23  6:15 PM   Specimen: BLOOD  Result Value Ref Range Status   Specimen Description   Final    BLOOD LEFT ANTECUBITAL Performed at [facility], [address], [place], [place] [address]    Special Requests   Final    Blood Culture adequate volume BOTTLES DRAWN AEROBIC AND ANAEROBIC Performed at [facility], [address], [place], [place] [address]    Culture   Final    NO GROWTH 3 DAYS Performed at [facility], [address], [place], [place] [address]    Report Status PENDING  Incomplete         Radiology Studies: No results found.       Scheduled Meds:  arformoterol  15 mcg Nebulization BID   ARIPiprazole   2 mg Oral Daily    budesonide  (PULMICORT ) nebulizer solution  0.25 mg Nebulization BID   buPROPion   150 mg Oral Daily   Influenza vac split trivalent PF  0.5 mL Intramuscular Tomorrow-1000   ipratropium-albuterol   3 mL Nebulization Q6H   memantine   5 mg Oral BID   pantoprazole  40 mg Oral Daily   polyethylene glycol  17 g Oral BID   predniSONE   5 mg Oral Q breakfast   rOPINIRole   1 mg Oral Daily   rOPINIRole   2 mg Oral QHS   senna-docusate  1 tablet Oral BID   sertraline   50 mg Oral Daily   Continuous Infusions:  cefTRIAXone  (ROCEPHIN )  IV 1 g (01/01/24 1758)     LOS: 1 day    Time spent: 35 minutes    Sheridan Gettel A Marquan Vokes, MD [facility]   If 7PM-7AM, please contact night-coverage [email or web address]  01/02/2024, 7:30 AM

## 2024-01-02 NOTE — TOC CM/SW Note (Signed)
    Durable Medical Equipment  (From admission, onward)           Start     Ordered   01/02/24 0945  For home use only DME standard manual wheelchair with seat cushion  Once       Comments: Patient suffers from weakness, dizziness which impairs their ability to perform daily activities like grooming in the home.  A walker will not resolve issue with performing activities of daily living. A wheelchair will allow patient to safely perform daily activities. Patient can safely propel the wheelchair in the home or has a caregiver who can provide assistance. Length of need 6 months . Accessories: elevating leg rests (ELRs), wheel locks, extensions and anti-tippers.   01/02/24 0944   01/02/24 0944  For home use only DME Hospital bed  Once       Question Answer Comment  Length of Need 6 Months   The above medical condition requires: Patient requires the ability to reposition frequently   Head must be elevated greater than: 45 degrees   Bed type Semi-electric      01/02/24 0943

## 2024-01-02 NOTE — TOC Transition Note (Addendum)
 Transition of Care [facility]) - Discharge Note   Patient Details  Name: [name] MRN: [id number] Date of Birth: 09-07-1929  [facility] CM/SW Contact:  Bascom Service, RN Phone Number: 01/02/2024, 9:24 AM   Clinical Narrative:   Anita(dtr) would like a hospital bed,platform rw(she said recc by otho), & w/c-[employer] was already active. If appropriate please order. also HHPT/OT/aide/csw-[facility] chosen & accepted.Has own transport home. -9:42a clarification of dme needed-spoke to Anita-w/c, hospital bed only. She will f/u with ortho on platform rw(not covered if gets gets the w/c which she feels is more needed. -9:53a-Confirmed patient can d/c when ready-dtr comfortable that hospital bed, & w/c dos not have to arrive in home prior d/c, but sometime later on day of d/c.[employer] to deliver dme to home-w/c,hospital bed. [facility] will get platform rw on own even though ordered.    Final next level of care: Home w Home Health Services Barriers to Discharge: No Barriers Identified   Patient Goals and CMS Choice Patient states their goals for this hospitalization and ongoing recovery are:: Home CMS Medicare.gov Compare Post Acute Care list provided to:: Patient Represenative (must comment) (Anita(dtr)) Choice offered to / list presented to : Adult Children [place] ownership interest in [facility].provided to:: Adult Children    Discharge Placement                       Discharge Plan and Services Additional resources added to the After Visit Summary for     Discharge Planning Services: CM Consult Post Acute Care Choice: Home Health, Durable Medical Equipment          DME Arranged: [facility] platform, Wheelchair manual, Hospital bed DME Agency: [employer] Date DME Agency Contacted: 01/02/24 Time DME Agency Contacted: [phone] Representative spoke with at DME Agency: [place] HH Arranged: PT, OT, Nurse's Aide, Social Work [employer] Agency: [facility] Date [facility] Agency Contacted: 01/02/24 Time HH Agency Contacted: [phone] Representative spoke with at [facility] Agency: [place]  Social Drivers of Health (SDOH) Interventions SDOH Screenings   Food Insecurity: No Food Insecurity (12/31/2023)  Housing: Low Risk  (12/31/2023)  Transportation Needs: No Transportation Needs (12/31/2023)  Utilities: Not At Risk (12/31/2023)  Alcohol Screen: Low Risk  (08/26/2023)  Depression (PHQ2-9): Medium Risk (09/30/2023)  Financial Resource Strain: Low Risk  (08/26/2023)  Physical Activity: Insufficiently Active (08/26/2023)  Social Connections: Socially Isolated (12/31/2023)  Stress: No Stress Concern Present (08/26/2023)  Tobacco Use: Low Risk  (12/30/2023)  Health Literacy: Adequate Health Literacy (08/26/2023)     Readmission Risk Interventions     No data to display

## 2024-01-03 DIAGNOSIS — N390 Urinary tract infection, site not specified: Secondary | ICD-10-CM | POA: Diagnosis not present

## 2024-01-03 DIAGNOSIS — W19XXXA Unspecified fall, initial encounter: Secondary | ICD-10-CM | POA: Diagnosis not present

## 2024-01-03 LAB — BASIC METABOLIC PANEL WITH GFR
Anion gap: 7 (ref 5–15)
BUN: 29 mg/dL — ABNORMAL HIGH (ref 8–23)
CO2: 27 mmol/L (ref 22–32)
Calcium: 9.1 mg/dL (ref 8.9–10.3)
Chloride: 103 mmol/L (ref 98–111)
Creatinine, Ser: 1.03 mg/dL — ABNORMAL HIGH (ref 0.44–1.00)
GFR, Estimated: 51 mL/min — ABNORMAL LOW (ref >60)
Glucose, Bld: 125 mg/dL — ABNORMAL HIGH (ref 70–99)
Potassium: 4.9 mmol/L (ref 3.5–5.1)
Sodium: 137 mmol/L (ref 135–145)

## 2024-01-03 LAB — URINE CULTURE: Culture: >=100000 — AB

## 2024-01-03 MED ORDER — METHOCARBAMOL 500 MG PO TABS
500.0000 mg | ORAL_TABLET | Freq: Three times a day (TID) | ORAL | Status: DC | PRN
  Administered 2024-01-03: 500 mg via ORAL
  Filled 2024-01-03: qty 1

## 2024-01-03 MED ORDER — ACETAMINOPHEN 500 MG PO TABS: 500.0000 mg | ORAL_TABLET | Freq: Three times a day (TID) | ORAL | 0 refills | Status: DC

## 2024-01-03 MED ORDER — METHOCARBAMOL 500 MG PO TABS: 500.0000 mg | ORAL_TABLET | Freq: Three times a day (TID) | ORAL | 0 refills | Status: AC | PRN

## 2024-01-03 MED ORDER — GUAIFENESIN ER 600 MG PO TB12: 600.0000 mg | ORAL_TABLET | Freq: Two times a day (BID) | ORAL | 0 refills | Status: AC

## 2024-01-03 MED ORDER — POLYETHYLENE GLYCOL 3350 17 G PO PACK: 17.0000 g | PACK | Freq: Two times a day (BID) | ORAL | 0 refills | Status: DC

## 2024-01-03 MED ORDER — GUAIFENESIN ER 600 MG PO TB12
600.0000 mg | ORAL_TABLET | Freq: Two times a day (BID) | ORAL | Status: DC
  Administered 2024-01-03: 600 mg via ORAL
  Filled 2024-01-03: qty 1

## 2024-01-03 MED ORDER — ACETAMINOPHEN 500 MG PO TABS
500.0000 mg | ORAL_TABLET | Freq: Three times a day (TID) | ORAL | Status: DC
  Administered 2024-01-03: 500 mg via ORAL
  Filled 2024-01-03: qty 1

## 2024-01-03 MED ORDER — CIPROFLOXACIN HCL 250 MG PO TABS: 250.0000 mg | ORAL_TABLET | Freq: Two times a day (BID) | ORAL | 0 refills | Status: AC

## 2024-01-03 MED ORDER — LIDOCAINE 5 % EX PTCH
1.0000 | MEDICATED_PATCH | Freq: Every day | CUTANEOUS | Status: DC
  Administered 2024-01-03: 1 via TRANSDERMAL
  Filled 2024-01-03: qty 1

## 2024-01-03 MED ORDER — HYDROCODONE-ACETAMINOPHEN 5-325 MG PO TABS
1.0000 | ORAL_TABLET | ORAL | Status: DC | PRN
  Administered 2024-01-03: 2 via ORAL
  Filled 2024-01-03: qty 2

## 2024-01-03 MED ORDER — SENNOSIDES-DOCUSATE SODIUM 8.6-50 MG PO TABS: 1.0000 | ORAL_TABLET | Freq: Two times a day (BID) | ORAL | 0 refills | Status: DC

## 2024-01-03 MED ORDER — GUAIFENESIN-DM 100-10 MG/5ML PO SYRP: 5.0000 mL | ORAL_SOLUTION | ORAL | 0 refills | Status: DC | PRN

## 2024-01-03 MED ORDER — CIPROFLOXACIN HCL 250 MG PO TABS
250.0000 mg | ORAL_TABLET | Freq: Two times a day (BID) | ORAL | Status: DC
  Filled 2024-01-03 (×2): qty 1

## 2024-01-03 MED ORDER — LIDOCAINE 5 % EX PTCH: 1.0000 | MEDICATED_PATCH | Freq: Every day | CUTANEOUS | 0 refills | Status: DC

## 2024-01-03 MED ORDER — HYDROCODONE-ACETAMINOPHEN 5-325 MG PO TABS: 1.0000 | ORAL_TABLET | ORAL | 0 refills | Status: DC | PRN

## 2024-01-03 MED ORDER — CIPROFLOXACIN HCL 500 MG PO TABS: 500.0000 mg | ORAL_TABLET | Freq: Two times a day (BID) | ORAL | 0 refills | Status: DC

## 2024-01-03 NOTE — Discharge Summary (Signed)
 Physician Discharge Summary   Patient: [name] MRN: [id number] DOB: 1929-07-12  Admit date:     12/30/2023  Discharge date: 01/03/24  Discharge Physician: Owen DELENA Lore   PCP: Wendolyn Jenkins Jansky, MD   Recommendations at discharge:    Follow up with Dr Loretha Will need palliative care out patient    Discharge Diagnoses: Principal Problem:   Hypotension Active Problems:   Near syncope   Distal radius fracture, right   Liver lesion   UTI (urinary tract infection)  Resolved Problems:   * No resolved hospital problems. *  Hospital Course: 89 year-old with past medical dementia, asthma, chronic hypoxic respite failure on hypertension, CKD stage IIIa, depression, GCA, polymyalgia rheumatica on Actemra  infusion, RLS, GERD, B12 deficiency presented for evaluation to the ED after a ground-level fall.  Patient lost her balance and fell backward striking her head.  She landed on her right wrist and complaining of pain.  Imaging notable mildly displaced distal right radial fracture, nonacute right L1-L2 transverse process fracture and medial heterogeneous lesion in the right lobe of the liver measuring 4.9 x 3.1 cm, CT) showed wall thickening of the right transverse colon with a small regional lymph nodes concerning for malignancy.  Initial plan was to discharge patient but as she has been rather patient episode of near syncope and was found to be hypotensive with systolic blood pressure in the 70s.  She received IV fluids IV antibiotics.      Assessment and Plan: 1-Hypotension, Near Syncope: -Plan to hold BP medications.  -Received IV fluids Resolved.    Mild right radius fracture: L1 and L2 transverse process fractures,  -Wrist splint placed in the ED -pain management  -Ortho consulted, needs to follow up out patient.  TLS brace Discharge on Vicodin 1-2 every 4 hours as need for pain. Baclofen  PRN , lidocaine  patch.   Liver mass: Circumferential wall thickening of the  proximal transverse colon  -Oncology consulted. Will follow recommendation for Biopsy  -Hepatitis panel negative,  -Alfa feto protein 1.8 Plan is to proceed with liver biopsy, they will continue to follow-up outpatient with Dr. Loretha   UTI, -Report Dysuria, UA 6-10 WBC -Follow urine culture. 100K gram negative rods.  -treated  with IV ceftriaxone .  -Follow up Blood culture; no growth to date.  Leukocytosis, follow trend.   discharge on cipro  for 3 days.   Prolonged QT, -Mg normal.  -Monitor.      Asthma Chronic hypoxic respiratory failure, -Continue with nebulizer.      Hypertension -Hold Norvasc . Monitor BP.    CKD 3A -Monitor renal function.  Mildly increased creatinine will order low rate IV fluid  Early dementia: -Continue with Memantine , aripiprazole , bupropion , sertraline  and valium  PRN   RLS: -On Ropinirole .    Polymyalgia rheumatica: -on Monthly Actemra  last dose 12/09/2023 Continue  prednisone     Constipation: Continue with MiraLAX.  Had BM        Consultants: Oncology  Procedures performed: none Disposition: Home Diet recommendation:  Discharge Diet Orders (From admission, onward)     Start     Ordered   01/03/24 0000  Diet - low sodium heart healthy        01/03/24 1151           Cardiac diet DISCHARGE MEDICATION: Allergies as of 01/03/2024       Reactions   Oxycodone  Itching, Other (See Comments)   Hallucinations, too   Statins Other (See Comments)   Myalgia    Advair  Diskus [fluticasone -salmeterol] Other (See Comments)   Mouth sores   Atorvastatin  Other (See Comments)   Severe headache   Sulfonamide Derivatives Rash        Medication List     STOP taking these medications    amLODipine  2.5 MG tablet Commonly known as: NORVASC    cephALEXin  250 MG capsule Commonly known as: KEFLEX    Cortisporin -TC 3.05-18-08-0.5 MG/ML OTIC suspension Generic drug: neomycin -colistin-hydrocortisone-thonzonium   formoterol  20 MCG/2ML  nebulizer solution Commonly known as: PERFOROMIST    olmesartan  40 MG tablet Commonly known as: BENICAR    traMADol  50 MG tablet Commonly known as: ULTRAM        TAKE these medications    acetaminophen  500 MG tablet Commonly known as: TYLENOL  Take 1 tablet (500 mg total) by mouth 3 (three) times daily.   albuterol  108 (90 Base) MCG/ACT inhaler Commonly known as: VENTOLIN  HFA Inhale 2 puffs into the lungs every 6 (six) hours as needed for wheezing or shortness of breath.   albuterol  (2.5 MG/3ML) 0.083% nebulizer solution Commonly known as: PROVENTIL  Take 3 mLs (2.5 mg total) by nebulization every 6 (six) hours as needed for wheezing or shortness of breath.   ARIPiprazole  2 MG tablet Commonly known as: Abilify  Take 1 tablet (2 mg total) by mouth daily.   aspirin  EC 81 MG tablet Take 81 mg by mouth daily.   budesonide  1 MG/2ML nebulizer solution Commonly known as: PULMICORT  Take 2 mLs (1 mg total) by nebulization in the morning and at bedtime.   budesonide -formoterol  160-4.5 MCG/ACT inhaler Commonly known as: Symbicort  Inhale 2 puffs into the lungs 2 (two) times daily.   buPROPion  150 MG 24 hr tablet Commonly known as: WELLBUTRIN  XL TAKE 1 TABLET DAILY What changed: when to take this   calcium -vitamin D  500-200 MG-UNIT tablet Commonly known as: OSCAL WITH D Take 1 tablet by mouth 3 (three) times daily.   ciprofloxacin  250 MG tablet Commonly known as: CIPRO  Take 1 tablet (250 mg total) by mouth 2 (two) times daily for 3 days.   cyanocobalamin  1000 MCG/ML injection Commonly known as: VITAMIN B12 Inject 1 mL (1,000 mcg total) into the muscle every 30 (thirty) days. INJECT 1000 MCG WEEKLY FOR 4 WEEKS THEN MONTHLY THEREAFTER What changed: additional instructions   diazepam  5 MG tablet Commonly known as: VALIUM  Take 1 tablet (5 mg total) by mouth every 12 (twelve) hours as needed for anxiety. What changed:  when to take this additional instructions   docusate  sodium 100 MG capsule Commonly known as: COLACE Take 100 mg by mouth at bedtime.   estradiol 0.1 MG/GM vaginal cream Commonly known as: ESTRACE Place 1 Applicatorful vaginally daily as needed (AS DIRECTED).   guaiFENesin 600 MG 12 hr tablet Commonly known as: MUCINEX Take 1 tablet (600 mg total) by mouth 2 (two) times daily.   guaiFENesin-dextromethorphan 100-10 MG/5ML syrup Commonly known as: ROBITUSSIN DM Take 5 mLs by mouth every 4 (four) hours as needed for cough.   HYDROcodone -acetaminophen  5-325 MG tablet Commonly known as: NORCO/VICODIN Take 1-2 tablets by mouth every 6 (six) hours as needed.   HYDROcodone -acetaminophen  5-325 MG tablet Commonly known as: NORCO/VICODIN Take 1-2 tablets by mouth every 4 (four) hours as needed for severe pain (pain score 7-10).   lidocaine  5 % Commonly known as: LIDODERM  Place 1 patch onto the skin daily. Remove & Discard patch within 12 hours or as directed by MD   memantine  5 MG tablet Commonly known as: NAMENDA  TAKE 1 TABLET TWICE A DAY What changed:  when to take this   methocarbamol 500 MG tablet Commonly known as: ROBAXIN Take 1 tablet (500 mg total) by mouth every 8 (eight) hours as needed for up to 10 days for muscle spasms. Do not take it with Valium    omega-3 acid ethyl esters 1 g capsule Commonly known as: LOVAZA  TAKE 2 CAPSULES TWICE A DAY What changed: when to take this   omeprazole  40 MG capsule Commonly known as: PRILOSEC Take 1 capsule (40 mg total) by mouth in the morning and at bedtime. What changed: when to take this   ondansetron  8 MG disintegrating tablet Commonly known as: ZOFRAN -ODT PLACE 1 TABLET ON TONGUE EVERY 8 HOURS AS NEEDED FOR NAUSEA OR VOMITING What changed: See the new instructions.   polyethylene glycol 17 g packet Commonly known as: MIRALAX / GLYCOLAX Take 17 g by mouth daily. What changed: Another medication with the same name was added. Make sure you understand how and when to take each.    polyethylene glycol 17 g packet Commonly known as: MIRALAX / GLYCOLAX Take 17 g by mouth 2 (two) times daily. What changed: You were already taking a medication with the same name, and this prescription was added. Make sure you understand how and when to take each.   predniSONE  5 MG tablet Commonly known as: DELTASONE  Take 5 mg by mouth at bedtime.   PROBIOTIC PO Take 1 capsule by mouth every other day.   rOPINIRole  1 MG tablet Commonly known as: REQUIP  TAKE 1 TABLET IN THE MORNING AND 2 TABLETS IN THE EVENING What changed: See the new instructions.   senna-docusate 8.6-50 MG tablet Commonly known as: Senokot-S Take 1 tablet by mouth 2 (two) times daily.   sertraline  50 MG tablet Commonly known as: ZOLOFT  TAKE 1 TABLET DAILY What changed: when to take this   Spiriva  Respimat 2.5 MCG/ACT Aers Generic drug: Tiotropium Bromide Inhale 2 Inhalations into the lungs daily at 12 noon. What changed: when to take this   Systane 0.4-0.3 % Soln Generic drug: Polyethyl Glycol-Propyl Glycol Place 1 drop into both eyes 4 (four) times daily as needed (for dryness).   tocilizumab  4 mg/kg in sodium chloride  0.9 % Inject 280 mg into the vein every 28 (twenty-eight) days.   Vitamin D3 50 MCG (2000 UT) Tabs Take 2,000 Units by mouth daily. What changed: Another medication with the same name was removed. Continue taking this medication, and follow the directions you see here.               Durable Medical Equipment  (From admission, onward)           Start     Ordered   01/02/24 0945  For home use only DME standard manual wheelchair with seat cushion  Once       Comments: Patient suffers from weakness, dizziness which impairs their ability to perform daily activities like grooming in the home.  A walker will not resolve issue with performing activities of daily living. A wheelchair will allow patient to safely perform daily activities. Patient can safely propel the wheelchair  in the home or has a caregiver who can provide assistance. Length of need 6 months . Accessories: elevating leg rests (ELRs), wheel locks, extensions and anti-tippers.   01/02/24 0944   01/02/24 0944  For home use only DME Hospital bed  Once       Question Answer Comment  Length of Need 6 Months   The above medical condition requires: Patient requires  the ability to reposition frequently   Head must be elevated greater than: 45 degrees   Bed type Semi-electric      01/02/24 0943            Follow-up Information     Agarwala, Anshul, MD. Schedule an appointment as soon as possible for a visit .   Specialty: Orthopedic Surgery Contact information: [address] [place]  [place] [address] [phone]         [facility] Follow up.   Specialty: Home Health Services Why: HHPT/OT/aide/csw Contact information: [address] [place] [address] [phone]         [facility] Follow up.   Why: hospital bed, w/c Contact information: [address] [place] [phone]               Discharge Exam: Filed Weights   12/30/23 2100  Weight: 76.3 kg   General; NAD  Condition at discharge: stable  The results of significant diagnostics from this hospitalization (including imaging, microbiology, ancillary and laboratory) are listed below for reference.   Imaging Studies: CT CHEST ABDOMEN PELVIS W CONTRAST Result Date: 12/30/2023 EXAM: CT CHEST, ABDOMEN AND PELVIS WITH CONTRAST 12/30/2023 05:03:32 PM TECHNIQUE: CT of the chest, abdomen and pelvis was performed with the administration of 100 mL of iohexol  (OMNIPAQUE ) 300 MG/ML solution. Multiplanar reformatted images are provided for review. Automated exposure control, iterative reconstruction, and/or weight based adjustment of the mA/kV was utilized to reduce the radiation dose to as low as reasonably achievable. COMPARISON: CT angiogram chest 325 2000 and 25. CT abdomen and pelvis 106 2012.  CLINICAL HISTORY: Polytrauma, blunt. Triage note: Pt arrives by [facility] due to mechanical fall pta at home. Pt has swelling and pain to right wrist which was splinted by paramedics. Pt also has pain to lower back and stuck head. No LOC. No blood thinners, takes 81mg  aspirin . FINDINGS: CHEST: MEDIASTINUM AND LYMPH NODES: Atherosclerotic calcifications at the aorta and coronary arteries. The heart is mildly enlarged. There is no mediastinal hematoma or pneumomediastinum. The central airways are clear. No mediastinal, hilar or axillary lymphadenopathy. LUNGS AND PLEURA: Minimal atelectasis in the lingula. Minimal tree-in-bud opacities in the bilateral upper lobes similar to prior study. A few scattered 2 mm nodular densities, unchanged. No focal consolidation or pulmonary edema. No pleural effusion or pneumothorax. ABDOMEN AND PELVIS: LIVER: There is a new heterogeneous hypodense lesion in the right lobe of the liver measuring 4.9x3.1 cm with enhancing septations. Additional scattered subcentimeter rounded hypodensities are similar to prior, likely cysts or hemangiomas. Calcified granulomas in the liver. No liver laceration or subcapsular fluid collection. GALLBLADDER AND BILE DUCTS: Gallbladder is unremarkable. No biliary ductal dilatation. SPLEEN: No acute abnormality. PANCREAS: No acute abnormality. ADRENAL GLANDS: No acute abnormality. KIDNEYS, URETERS AND BLADDER: No stones in the kidneys or ureters. No hydronephrosis. No perinephric or periureteral stranding. Urinary bladder is unremarkable. GI AND BOWEL: Stomach demonstrates no acute abnormality. Circumferential wall thickening of the proximal transverse colon without surrounding inflammation. There are small surrounding lymph nodes. Appendix appears normal. Sigmoid colon diverticulosis. There is no bowel obstruction. REPRODUCTIVE ORGANS: Uterus is surgically absent. PERITONEUM AND RETROPERITONEUM: No ascites. No free air. VASCULATURE: Aorta is normal in caliber.  Atherosclerotic calcifications throughout the aorta. ABDOMINAL AND PELVIS LYMPH NODES: No lymphadenopathy. BONES AND SOFT TISSUES: Stable chronic compression deformity of T5. Healing right L1 and L2 transverse process fractures which are new from prior. L3-S1 posterior fusion hardware present. No acute fractures are identified.  Chronic right hip joint effusion appears unchanged. No focal soft tissue abnormality. IMPRESSION: 1. No acute posttraumatic sequelae in the chest, abdomen, or pelvis. 2. New heterogeneous lesion in the right lobe of the liver measuring 4.9 x 3.1 cm with enhancing septations. Findings are concerning for a primary neoplasm, metastatic disease, or infection in the appropriate clinical setting. 3. Circumferential wall thickening of the proximal transverse colon with small regional lymph nodes. No bowel obstruction. Malignancy is not excluded. Recommend follow-up direct visualization. 4. Chronic tree-in-bud opacities in the upper lobes appear unchanged, likely infectious/inflammatory. Electronically signed by: Greig Pique MD 12/30/2023 05:25 PM EDT RP Workstation: [id number]   CT HEAD WO CONTRAST Result Date: 12/30/2023 EXAM: CT HEAD WITHOUT CONTRAST 12/30/2023 01:57:54 PM TECHNIQUE: CT of the head was performed without the administration of intravenous contrast. Automated exposure control, iterative reconstruction, and/or weight based adjustment of the mA/kV was utilized to reduce the radiation dose to as low as reasonably achievable. COMPARISON: Head CT 08/03/2021 and MRI 02/25/2020. CLINICAL HISTORY: Head trauma, moderate-severe. Mechanical fall, struck head. No loss of consciousness (LOC). Takes 81mg  aspirin . FINDINGS: BRAIN AND VENTRICLES: There is no evidence of an acute infarct, intracranial hemorrhage, mass, midline shift, hydrocephalus, or extra-axial fluid collection. Cerebral atrophy is within normal limits for age. Patchy cerebral white matter hypodensities are similar to the  prior CT and are nonspecific but compatible with mild to moderate chronic small vessel ischemic disease. Calcified atherosclerosis at the skull base. ORBITS: Bilateral cataract extraction. SINUSES: No acute abnormality. SOFT TISSUES AND SKULL: No acute soft tissue abnormality. No skull fracture. IMPRESSION: 1. No acute intracranial abnormality. 2. Mild to moderate chronic small vessel ischemic disease. Electronically signed by: Dasie Hamburg MD 12/30/2023 02:29 PM EDT RP Workstation: [id number]   CT CERVICAL SPINE WO CONTRAST Result Date: 12/30/2023 CLINICAL DATA:  Polytrauma, blunt Mechanical fall at home.  Head injury. EXAM: CT CERVICAL SPINE WITHOUT CONTRAST TECHNIQUE: Multidetector CT imaging of the cervical spine was performed without intravenous contrast. Multiplanar CT image reconstructions were also generated. RADIATION DOSE REDUCTION: This exam was performed according to the departmental dose-optimization program which includes automated exposure control, adjustment of the mA and/or kV according to patient size and/or use of iterative reconstruction technique. COMPARISON:  Cervical spine radiographs 03/14/2020. FINDINGS: Alignment: Straightening with a minimal degenerative anterolisthesis at C2-3 and minimal retrolisthesis at C4-5. Skull base and vertebrae: No evidence of acute cervical spine fracture or traumatic subluxation. Soft tissues and spinal canal: No prevertebral fluid or swelling. No visible canal hematoma. Disc levels: Multilevel spondylosis with disc space narrowing, uncinate spurring and facet hypertrophy. There is asymmetric facet hypertrophy on the right at C2-3. Up to mild foraminal narrowing at multiple levels. No large disc herniation or high-grade spinal stenosis identified. Upper chest: Clear lung apices. Other: Bilateral carotid atherosclerosis. Small calcified right cervical lymph nodes. IMPRESSION: 1. No evidence of acute cervical spine fracture, traumatic subluxation or static  signs of instability. 2. Multilevel cervical spondylosis as described. Electronically Signed   By: Elsie Perone M.D.   On: 12/30/2023 14:14   CT Lumbar Spine Wo Contrast Result Date: 12/30/2023 CLINICAL DATA:  Back trauma. Mechanical fall prior to admission at home. Low back pain. EXAM: CT LUMBAR SPINE WITHOUT CONTRAST TECHNIQUE: Multidetector CT imaging of the lumbar spine was performed without intravenous contrast administration. Multiplanar CT image reconstructions were also generated. RADIATION DOSE REDUCTION: This exam was performed according to the departmental dose-optimization program which includes automated exposure control, adjustment of the mA and/or kV according to patient  size and/or use of iterative reconstruction technique. COMPARISON:  Lumbar spine radiographs 01/05/2019, lumbar MRI 02/28/2017 and lumbar CT discogram 03/23/2007 FINDINGS: Segmentation: There are 5 lumbar type vertebral bodies. Alignment: Physiologic. Vertebrae: There are stable remote postsurgical changes from previous L3 through S1 PLIF. The hardware appears intact, without loosening. There is solid interbody fusion at each level. There are nonacute fractures involving the right L1 and L2 transverse processes. No evidence of acute fracture or traumatic subluxation. Paraspinal and other soft tissues: No acute paraspinal findings. Aortic and branch vessel atherosclerosis. Fatty atrophy of the erector spinae musculature inferiorly. Disc levels: No significant disc space findings demonstrated within the visualized lower thoracic spine. At L1-2, there is progressive loss of disc height with annular disc bulging, endplate osteophytes and vacuum phenomenon. Mild bilateral facet hypertrophy. Resulting mild spinal stenosis without significant foraminal narrowing. L2-3: Stable postsurgical changes from previous left laminectomy. Progressive loss of disc height with annular disc bulging, endplate osteophytes and vacuum phenomenon. Mild  bilateral facet hypertrophy. No significant spinal stenosis. Mild foraminal narrowing bilaterally. No acute findings are demonstrated at the operative levels status post L3 through S1 fusion. The spinal canal is decompressed. There is mild chronic osseous foraminal narrowing on the left at L4-5. IMPRESSION: 1. No evidence of acute lumbar spine fracture, traumatic subluxation or static signs of instability. Nonacute fractures of the right L1 and L2 transverse processes. 2. Stable remote postsurgical changes from previous L3 through S1 PLIF. 3. 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 No high-grade spinal stenosis or definite nerve root encroachment. 4.  Aortic Atherosclerosis (ICD10-I70.0). Electronically Signed   By: Elsie Perone M.D.   On: 12/30/2023 14:10   DG Chest Port 1 View Result Date: 12/30/2023 CLINICAL DATA:  Status post fall. EXAM: PORTABLE CHEST 1 VIEW COMPARISON:  June 10, 2023 FINDINGS: The cardiac silhouette is mildly enlarged and unchanged in size. There is marked severity calcification of the aortic arch. No acute infiltrate, pleural effusion or pneumothorax is identified. The visualized skeletal structures are unremarkable. IMPRESSION: No active disease. Electronically Signed   By: Suzen Dials M.D.   On: 12/30/2023 13:34   DG Wrist Complete Right Result Date: 12/30/2023 EXAM: 3 or more VIEW(S) XRAY OF THE RIGHT WRIST 12/30/2023 01:04:00 PM COMPARISON: None available. CLINICAL HISTORY: fall, wrist pain and swelling. Pt arrives by [facility] due to mechanical fall pta at home. Pt has swelling and pain to right wrist which was splinted by paramedics. Pt also has pain to lower back and stuck head. No LOC. No blood thinners, takes 81mg  aspirin  FINDINGS: BONES AND JOINTS: Mildly displaced distal right radial fracture is noted. Severe osteoarthritis is seen involving the first carpometacarpal joint. Calcification of triangular fibrocartilage is noted. No focal  osseous lesion. No joint dislocation. SOFT TISSUES: The soft tissues are unremarkable. IMPRESSION: 1. Mildly displaced distal right radial fracture. 2. Severe osteoarthritis of the first carpometacarpal joint. 3. Calcification of the triangular fibrocartilage complex (chondrocalcinosis). Electronically signed by: Lynwood Seip MD 12/30/2023 01:32 PM EDT RP Workstation: [id number]   DG Pelvis Portable Result Date: 12/30/2023 CLINICAL DATA:  Status post fall. EXAM: PORTABLE PELVIS 1-2 VIEWS COMPARISON:  January 05, 2019 FINDINGS: There is no evidence of pelvic fracture or diastasis. No pelvic bone lesions are seen. Degenerative changes are seen involving both hips in the form of joint space narrowing and acetabular sclerosis. Postoperative changes are noted within the lumbar spine. IMPRESSION: No acute osseous abnormality. Electronically Signed   By: Suzen Dials HERO.D.  On: 12/30/2023 13:32    Microbiology: Results for orders placed or performed during the hospital encounter of 12/30/23  Urine Culture     Status: Abnormal   Collection Time: 12/30/23  4:26 PM   Specimen: Urine, Clean Catch  Result Value Ref Range Status   Specimen Description   Final    URINE, CLEAN CATCH Performed at [facility], [address] [place], [place] [address]    Special Requests   Final    NONE Performed at [facility], [address], [place], [place] [address]    Culture (A)  Final    >=100,000 COLONIES/mL ENTEROBACTER CLOACAE 50,000 COLONIES/mL PROTEUS MIRABILIS    Report Status 01/03/2024 FINAL  Final   Organism ID, Bacteria ENTEROBACTER CLOACAE (A)  Final   Organism ID, Bacteria PROTEUS MIRABILIS (A)  Final      Susceptibility   Enterobacter cloacae - MIC*    CEFEPIME <=0.12 SENSITIVE Sensitive     ERTAPENEM <=0.12 SENSITIVE Sensitive     CIPROFLOXACIN  <=0.06 SENSITIVE Sensitive     GENTAMICIN <=1 SENSITIVE Sensitive     NITROFURANTOIN <=16 SENSITIVE  Sensitive     TRIMETH/SULFA <=20 SENSITIVE Sensitive     MEROPENEM <=0.25 SENSITIVE Sensitive     * >=100,000 COLONIES/mL ENTEROBACTER CLOACAE   Proteus mirabilis - MIC*    AMPICILLIN <=2 SENSITIVE Sensitive     CEFAZOLIN  (URINE) Value in next row Sensitive      4 SENSITIVEThis is a modified FDA-approved test that has been validated and its performance characteristics determined by the reporting laboratory.  This laboratory is certified under the Clinical Laboratory Improvement Amendments CLIA as qualified to perform high complexity clinical laboratory testing.    CEFEPIME Value in next row Sensitive      4 SENSITIVEThis is a modified FDA-approved test that has been validated and its performance characteristics determined by the reporting laboratory.  This laboratory is certified under the Clinical Laboratory Improvement Amendments CLIA as qualified to perform high complexity clinical laboratory testing.    ERTAPENEM Value in next row Sensitive      4 SENSITIVEThis is a modified FDA-approved test that has been validated and its performance characteristics determined by the reporting laboratory.  This laboratory is certified under the Clinical Laboratory Improvement Amendments CLIA as qualified to perform high complexity clinical laboratory testing.    CEFTRIAXONE  Value in next row Sensitive      4 SENSITIVEThis is a modified FDA-approved test that has been validated and its performance characteristics determined by the reporting laboratory.  This laboratory is certified under the Clinical Laboratory Improvement Amendments CLIA as qualified to perform high complexity clinical laboratory testing.    CIPROFLOXACIN  Value in next row Sensitive      4 SENSITIVEThis is a modified FDA-approved test that has been validated and its performance characteristics determined by the reporting laboratory.  This laboratory is certified under the Clinical Laboratory Improvement Amendments CLIA as qualified to perform  high complexity clinical laboratory testing.    GENTAMICIN Value in next row Sensitive      4 SENSITIVEThis is a modified FDA-approved test that has been validated and its performance characteristics determined by the reporting laboratory.  This laboratory is certified under the Clinical Laboratory Improvement Amendments CLIA as qualified to perform high complexity clinical laboratory testing.    NITROFURANTOIN Value in next row Resistant      4 SENSITIVEThis is a modified FDA-approved test that has been validated and its performance characteristics determined by the reporting  laboratory.  This laboratory is certified under the Clinical Laboratory Improvement Amendments CLIA as qualified to perform high complexity clinical laboratory testing.    TRIMETH/SULFA Value in next row Sensitive      4 SENSITIVEThis is a modified FDA-approved test that has been validated and its performance characteristics determined by the reporting laboratory.  This laboratory is certified under the Clinical Laboratory Improvement Amendments CLIA as qualified to perform high complexity clinical laboratory testing.    AMPICILLIN/SULBACTAM Value in next row Sensitive      4 SENSITIVEThis is a modified FDA-approved test that has been validated and its performance characteristics determined by the reporting laboratory.  This laboratory is certified under the Clinical Laboratory Improvement Amendments CLIA as qualified to perform high complexity clinical laboratory testing.    PIP/TAZO Value in next row Sensitive      <=4 SENSITIVEThis is a modified FDA-approved test that has been validated and its performance characteristics determined by the reporting laboratory.  This laboratory is certified under the Clinical Laboratory Improvement Amendments CLIA as qualified to perform high complexity clinical laboratory testing.    MEROPENEM Value in next row Sensitive      <=4 SENSITIVEThis is a modified FDA-approved test that has been  validated and its performance characteristics determined by the reporting laboratory.  This laboratory is certified under the Clinical Laboratory Improvement Amendments CLIA as qualified to perform high complexity clinical laboratory testing.    * 50,000 COLONIES/mL PROTEUS MIRABILIS  Blood culture (routine x 2)     Status: None (Preliminary result)   Collection Time: 12/30/23  6:10 PM   Specimen: BLOOD  Result Value Ref Range Status   Specimen Description   Final    BLOOD RIGHT ANTECUBITAL Performed at [facility], [address], [place], [place] [address]    Special Requests   Final    Blood Culture adequate volume BOTTLES DRAWN AEROBIC AND ANAEROBIC Performed at [facility], [address], [place], [place] [address]    Culture   Final    NO GROWTH 4 DAYS Performed at [facility], [address], [place], [place] [address]    Report Status PENDING  Incomplete  Blood culture (routine x 2)     Status: None (Preliminary result)   Collection Time: 12/30/23  6:15 PM   Specimen: BLOOD  Result Value Ref Range Status   Specimen Description   Final    BLOOD LEFT ANTECUBITAL Performed at [facility], [address] [place], [place] [address]    Special Requests   Final    Blood Culture adequate volume BOTTLES DRAWN AEROBIC AND ANAEROBIC Performed at [facility], [address], [place], [place] [address]    Culture   Final    NO GROWTH 4 DAYS Performed at [facility], [address], [place], [place] [address]    Report Status PENDING  Incomplete    Labs: CBC: Recent Labs  Lab 12/30/23 1308 12/31/23 0004 01/01/24 0517 01/02/24 0835  WBC 15.3* 17.6* 12.8* 13.4*  HGB 12.7 12.0 11.6* 11.3*  HCT 38.3 37.2 37.0 35.0*  MCV 93.6 97.9 100.5* 98.0  PLT 168 153 118* 127*   Basic Metabolic Panel: Recent Labs  Lab 12/30/23 1308 12/31/23 0004 01/01/24 0517 01/02/24 0835  01/03/24 0907  NA 138  --  139 139 137  K 4.3  --  4.9 5.0 4.9  CL 102  --  105 104 103  CO2 28  --  25  28 27  GLUCOSE 105*  --  126* 129* 125*  BUN 18  --  23 28* 29*  CREATININE 1.10*  --  1.27* 1.43* 1.03*  CALCIUM  9.8  --  8.8* 9.0 9.1  MG  --  2.2  --   --   --    Liver Function Tests: Recent Labs  Lab 12/30/23 1308  AST 36  ALT 24  ALKPHOS 98  BILITOT 0.4  PROT 6.0*  ALBUMIN 4.0   CBG: No results for input(s): GLUCAP in the last 168 hours.  Discharge time spent: greater than 30 minutes.  Signed: Owen DELENA Lore, MD [facility] 01/03/2024

## 2024-01-03 NOTE — Progress Notes (Signed)
 Pt's son picked up Norco from [facility] - pt's daughter here, verbalized an understanding that same  would not be due to be filled today  - too soon through insurance

## 2024-01-03 NOTE — Progress Notes (Signed)
 Physical Therapy Treatment Patient Details Name: [name] MRN: [id number] DOB: Feb 02, 1930 Today's Date: 01/03/2024   History of Present Illness [name] is a 89 yr old female brought to the hospital after a fall. She was found to have concern for hypotension/near syncope, and a mildly displaced R distal radius fracture. PMH: dementia, asthma, chronic hypoxemic respiratory failure on 2L O2, HTN, HLD, CKD III, anxiety, depression, polymyalgia rheumatica, RLS, GERD, vitamin B12 deficiency, GCA    PT Comments  Pt very cooperative and with good mobility progress this session including up to EOB sitting with noted balance improvement, up to standing, ambulated very short distances with PFRW and performed step pvt transfers with PFRW bed to recliner and recliner to WC.  Pt fatigued but pt and dtr pleased with improvement after last attempt to mobilize.  Pt with DC to home order in place and will benefit from follow up HHPT/OT to maximize IND and safety in home setting.  Dtr reports hospital bed, WC and ramp in place.    If plan is discharge home, recommend the following: A lot of help with walking and/or transfers;A lot of help with bathing/dressing/bathroom;Assistance with cooking/housework;Assist for transportation;Help with stairs or ramp for entrance   Can travel by private vehicle     Yes  Equipment Recommendations  Other (comment)    Recommendations for Other Services       Precautions / Restrictions Precautions Precautions: Fall Restrictions Weight Bearing Restrictions Per Provider Order: Yes RUE Weight Bearing Per Provider Order: Non weight bearing Other Position/Activity Restrictions: OK for WB on R elbow on platform RW     Mobility  Bed Mobility Overal bed mobility: Needs Assistance Bed Mobility: Supine to Sit     Supine to sit: Min assist, Mod assist, HOB elevated     General bed mobility comments: INcreased time; elevated HOB to assist with sitting up, pt able to  walk LEs to EOB but requiring min/mod assist to bring trunk forward to sitting erect.  Min assist to complete rotation to EOB sitting with bed pad    Transfers Overall transfer level: Needs assistance Equipment used: Right platform walker Transfers: Sit to/from Stand Sit to Stand: +2 physical assistance, +2 safety/equipment, From elevated surface, Min assist, Mod assist           General transfer comment: cues for LE management and use of UE to self assist but with no use of R hand/wrist.  Physical assist to bring wt up and fwd and to stabilize with PFRW.  INcreased assist to stand from low chair. Pt performed step pvt bed to recliner and recliner to WC.    Ambulation/Gait Ambulation/Gait assistance: Min assist, +2 safety/equipment Gait Distance (Feet): 3 Feet (3' twice) Assistive device: Rolling walker (2 wheels) Gait Pattern/deviations: Step-to pattern, Decreased step length - right, Decreased step length - left, Shuffle, Trunk flexed       General Gait Details: LEs shaky with mild buckling but did not give way.  Min assist for stability/support and RW management.   Stairs             Wheelchair Mobility     Tilt Bed    Modified Rankin (Stroke Patients Only)       Balance Overall balance assessment: Needs assistance, History of Falls Sitting-balance support: Feet supported Sitting balance-Leahy Scale: Good     Standing balance support: Bilateral upper extremity supported Standing balance-Leahy Scale: Poor  Communication Communication Communication: Impaired Factors Affecting Communication: Hearing impaired  Cognition Arousal: Alert Behavior During Therapy: WFL for tasks assessed/performed   PT - Cognitive impairments: History of cognitive impairments                       PT - Cognition Comments: pt having difficulty with recall for R distal UE NWB Following commands: Intact (requires repetition 2*  hearing deficits)      Cueing Cueing Techniques: Verbal cues, Gestural cues  Exercises      General Comments        Pertinent Vitals/Pain Pain Assessment Pain Assessment: 0-10 Pain Score: 5  Pain Location: low back pain Pain Descriptors / Indicators: Grimacing, Guarding Pain Intervention(s): Limited activity within patient's tolerance, Monitored during session, Premedicated before session    Home Living                          Prior Function            PT Goals (current goals can now be found in the care plan section) Acute Rehab PT Goals Patient Stated Goal: to move better PT Goal Formulation: With patient Time For Goal Achievement: 01/14/24 Potential to Achieve Goals: Good Progress towards PT goals: Progressing toward goals    Frequency    Min 2X/week      PT Plan      Co-evaluation              AM-PAC PT 6 Clicks Mobility   Outcome Measure  Help needed turning from your back to your side while in a flat bed without using bedrails?: A Lot Help needed moving from lying on your back to sitting on the side of a flat bed without using bedrails?: A Lot Help needed moving to and from a bed to a chair (including a wheelchair)?: A Little Help needed standing up from a chair using your arms (e.g., wheelchair or bedside chair)?: A Lot Help needed to walk in hospital room?: A Lot Help needed climbing 3-5 steps with a railing? : Total 6 Click Score: 12    End of Session Equipment Utilized During Treatment: Gait belt Activity Tolerance: Patient limited by fatigue;Patient limited by pain Patient left: in chair;with family/visitor present;with nursing/sitter in room Nurse Communication: Mobility status PT Visit Diagnosis: Unsteadiness on feet (R26.81);Muscle weakness (generalized) (M62.81);Difficulty in walking, not elsewhere classified (R26.2);Pain Pain - Right/Left: Right Pain - part of body: Arm;Hand (but moreso low back)     Time:  1540-1620 PT Time Calculation (min) (ACUTE ONLY): 40 min  Charges:    $Therapeutic Activity: 23-37 mins PT General Charges $$ ACUTE PT VISIT: 1 Visit                     [facility] PT Acute Rehabilitation Services Office [phone]    Aerabella Galasso 01/03/2024, 4:42 PM

## 2024-01-03 NOTE — TOC Progression Note (Signed)
 Transition of Care [facility]) - Progression Note    Patient Details  Name: [name] MRN: [id number] Date of Birth: 1929-12-01  [facility] CM/SW Contact  Sonda Manuella Quill, RN Phone Number: 01/03/2024, 1:22 PM  Clinical Narrative:    Cassius at [facility] notified pt has d/c orders; no IP CM needs.   Expected Discharge Plan: Home w Home Health Services Barriers to Discharge: No Barriers Identified               Expected Discharge Plan and Services   Discharge Planning Services: CM Consult Post Acute Care Choice: Home Health, Durable Medical Equipment Living arrangements for the past 2 months: Single Family Home Expected Discharge Date: 01/03/24               DME Arranged: Vannie platform, Wheelchair manual, Hospital bed DME Agency: [employer] Date DME Agency Contacted: 01/02/24 Time DME Agency Contacted: [phone] Representative spoke with at DME Agency: [place] HH Arranged: PT, OT, Nurse's Aide, Social Work [employer] Agency: [facility] Date [facility] Agency Contacted: 01/02/24 Time HH Agency Contacted: [phone] Representative spoke with at [facility] Agency: [place]   Social Drivers of Health (SDOH) Interventions SDOH Screenings   Food Insecurity: No Food Insecurity (12/31/2023)  Housing: Low Risk  (12/31/2023)  Transportation Needs: No Transportation Needs (12/31/2023)  Utilities: Not At Risk (12/31/2023)  Alcohol Screen: Low Risk  (08/26/2023)  Depression (PHQ2-9): Medium Risk (09/30/2023)  Financial Resource Strain: Low Risk  (08/26/2023)  Physical Activity: Insufficiently Active (08/26/2023)  Social Connections: Socially Isolated (12/31/2023)  Stress: No Stress Concern Present (08/26/2023)  Tobacco Use: Low Risk  (12/30/2023)  Health Literacy: Adequate Health Literacy (08/26/2023)    Readmission Risk Interventions     No data to display

## 2024-01-04 LAB — CULTURE, BLOOD (ROUTINE X 2)
Culture: NO GROWTH
Culture: NO GROWTH
Special Requests: ADEQUATE
Special Requests: ADEQUATE

## 2024-01-05 ENCOUNTER — Telehealth: Payer: Self-pay

## 2024-01-05 DIAGNOSIS — I4581 Long QT syndrome: Secondary | ICD-10-CM | POA: Diagnosis not present

## 2024-01-05 DIAGNOSIS — J4540 Moderate persistent asthma, uncomplicated: Secondary | ICD-10-CM | POA: Diagnosis not present

## 2024-01-05 DIAGNOSIS — M315 Giant cell arteritis with polymyalgia rheumatica: Secondary | ICD-10-CM | POA: Diagnosis not present

## 2024-01-05 DIAGNOSIS — D631 Anemia in chronic kidney disease: Secondary | ICD-10-CM | POA: Diagnosis not present

## 2024-01-05 DIAGNOSIS — E538 Deficiency of other specified B group vitamins: Secondary | ICD-10-CM | POA: Diagnosis not present

## 2024-01-05 DIAGNOSIS — M1990 Unspecified osteoarthritis, unspecified site: Secondary | ICD-10-CM | POA: Diagnosis not present

## 2024-01-05 DIAGNOSIS — M80031D Age-related osteoporosis with current pathological fracture, right forearm, subsequent encounter for fracture with routine healing: Secondary | ICD-10-CM | POA: Diagnosis not present

## 2024-01-05 DIAGNOSIS — M21371 Foot drop, right foot: Secondary | ICD-10-CM | POA: Diagnosis not present

## 2024-01-05 DIAGNOSIS — F0394 Unspecified dementia, unspecified severity, with anxiety: Secondary | ICD-10-CM | POA: Diagnosis not present

## 2024-01-05 DIAGNOSIS — K219 Gastro-esophageal reflux disease without esophagitis: Secondary | ICD-10-CM | POA: Diagnosis not present

## 2024-01-05 DIAGNOSIS — N1831 Chronic kidney disease, stage 3a: Secondary | ICD-10-CM | POA: Diagnosis not present

## 2024-01-05 DIAGNOSIS — I447 Left bundle-branch block, unspecified: Secondary | ICD-10-CM | POA: Diagnosis not present

## 2024-01-05 DIAGNOSIS — I959 Hypotension, unspecified: Secondary | ICD-10-CM | POA: Diagnosis not present

## 2024-01-05 DIAGNOSIS — K589 Irritable bowel syndrome without diarrhea: Secondary | ICD-10-CM | POA: Diagnosis not present

## 2024-01-05 DIAGNOSIS — F32A Depression, unspecified: Secondary | ICD-10-CM | POA: Diagnosis not present

## 2024-01-05 DIAGNOSIS — F419 Anxiety disorder, unspecified: Secondary | ICD-10-CM | POA: Diagnosis not present

## 2024-01-05 DIAGNOSIS — J9610 Chronic respiratory failure, unspecified whether with hypoxia or hypercapnia: Secondary | ICD-10-CM | POA: Diagnosis not present

## 2024-01-05 DIAGNOSIS — K5909 Other constipation: Secondary | ICD-10-CM | POA: Diagnosis not present

## 2024-01-05 DIAGNOSIS — M8008XD Age-related osteoporosis with current pathological fracture, vertebra(e), subsequent encounter for fracture with routine healing: Secondary | ICD-10-CM | POA: Diagnosis not present

## 2024-01-05 DIAGNOSIS — M5415 Radiculopathy, thoracolumbar region: Secondary | ICD-10-CM | POA: Diagnosis not present

## 2024-01-05 DIAGNOSIS — F0393 Unspecified dementia, unspecified severity, with mood disturbance: Secondary | ICD-10-CM | POA: Diagnosis not present

## 2024-01-05 DIAGNOSIS — I129 Hypertensive chronic kidney disease with stage 1 through stage 4 chronic kidney disease, or unspecified chronic kidney disease: Secondary | ICD-10-CM | POA: Diagnosis not present

## 2024-01-05 DIAGNOSIS — K760 Fatty (change of) liver, not elsewhere classified: Secondary | ICD-10-CM | POA: Diagnosis not present

## 2024-01-05 DIAGNOSIS — G2581 Restless legs syndrome: Secondary | ICD-10-CM | POA: Diagnosis not present

## 2024-01-05 DIAGNOSIS — K5790 Diverticulosis of intestine, part unspecified, without perforation or abscess without bleeding: Secondary | ICD-10-CM | POA: Diagnosis not present

## 2024-01-05 NOTE — Transitions of Care (Post Inpatient/ED Visit) (Signed)
   01/05/2024  Name: [name] MRN: [id number] DOB: Aug 08, 1929  Today's TOC FU Call Status: Today's TOC FU Call Status:: Unsuccessful Call (1st Attempt) Unsuccessful Call (1st Attempt) Date: 01/05/24 (daughter would like a call back tomorrow am due to home health being in the home during call.)  Attempted to reach the patient regarding the most recent Inpatient/ED visit. Spoke with daughter [name] and home health is with patient right now and daughter would like a call back tomorrow.  Follow Up Plan: Additional outreach attempts will be made to reach the patient to complete the Transitions of Care (Post Inpatient/ED visit) call.   Alan Ee, RN, BSN, CEN [employer]- Transition of Care Team.  [facility] [phone]

## 2024-01-05 NOTE — Telephone Encounter (Signed)
 Copied from CRM #[id number]. Topic: Referral - Question >> Jan 02, 2024  4:33 PM Ashley R wrote: Reason for CRM: Clinical manager from [facility] calling regarding pt.    Referral from Darryle Law was received today, care will begin on Monday, notifying as this is over 48 hours.

## 2024-01-06 ENCOUNTER — Telehealth: Payer: Self-pay

## 2024-01-06 DIAGNOSIS — M315 Giant cell arteritis with polymyalgia rheumatica: Secondary | ICD-10-CM | POA: Diagnosis not present

## 2024-01-06 DIAGNOSIS — F32A Depression, unspecified: Secondary | ICD-10-CM | POA: Diagnosis not present

## 2024-01-06 DIAGNOSIS — F0394 Unspecified dementia, unspecified severity, with anxiety: Secondary | ICD-10-CM | POA: Diagnosis not present

## 2024-01-06 DIAGNOSIS — M5415 Radiculopathy, thoracolumbar region: Secondary | ICD-10-CM | POA: Diagnosis not present

## 2024-01-06 DIAGNOSIS — F419 Anxiety disorder, unspecified: Secondary | ICD-10-CM | POA: Diagnosis not present

## 2024-01-06 DIAGNOSIS — F0393 Unspecified dementia, unspecified severity, with mood disturbance: Secondary | ICD-10-CM | POA: Diagnosis not present

## 2024-01-06 NOTE — Telephone Encounter (Signed)
 Copied from CRM #[id number]. Topic: Clinical - Home Health Verbal Orders >> Jan 06, 2024 11:05 AM Mercedes MATSU wrote: Caller/Agency: Katheryn calling from [facility] Callback Number: Katheryn calling from [facility] Service Requested: Physical Therapy Frequency: PT SERVICES 1 WEEK 1, 2XWS A WEEK FOR 3 WEEKS, 1 TIME A WEEK 5 WEEKS. Any new concerns about the patient? Yes, patient completed last pill for UTI, but daughter states there ius still a foul odor when patient urinates.

## 2024-01-06 NOTE — Telephone Encounter (Addendum)
 Copied from CRM #[id number]. Topic: Clinical - Home Health Verbal Orders >> Jan 06, 2024  8:47 AM Roselie BROCKS wrote: Caller/Agency: [facility]  Callback Number: [phone] Service Requested: Physical Therapy Frequency: need nurse visit every week times 3 and every times 2 Any new concerns about the patient? No   Verbal order given per Dr Wendolyn.

## 2024-01-07 ENCOUNTER — Other Ambulatory Visit: Payer: Self-pay | Admitting: Family Medicine

## 2024-01-07 DIAGNOSIS — E538 Deficiency of other specified B group vitamins: Secondary | ICD-10-CM

## 2024-01-07 MED ORDER — PROBIOTIC PO CHEW: 1.0000 | CHEWABLE_TABLET | ORAL | 3 refills | Status: AC

## 2024-01-07 NOTE — Telephone Encounter (Signed)
 Follow up appointment on 01/15/2024

## 2024-01-08 ENCOUNTER — Telehealth: Payer: Self-pay

## 2024-01-08 ENCOUNTER — Telehealth: Payer: Self-pay | Admitting: *Deleted

## 2024-01-08 NOTE — Transitions of Care (Post Inpatient/ED Visit) (Signed)
   01/08/2024  Name: Olivine Iles MRN: [id number] DOB: Apr 27, 1929  Today's TOC FU Call Status: Today's TOC FU Call Status:: Unsuccessful Call (2nd Attempt) Unsuccessful Call (2nd Attempt) Date: 01/08/24  Attempted to reach the patient regarding the most recent Inpatient/ED visit.  Follow Up Plan: Additional outreach attempts will be made to reach the patient to complete the Transitions of Care (Post Inpatient/ED visit) call.   Alan Ee, RN, BSN, CEN [employer]- Transition of Care Team.  [facility] [phone]

## 2024-01-08 NOTE — Transitions of Care (Post Inpatient/ED Visit) (Signed)
   01/08/2024  Name: [name] MRN: [id number] DOB: [birth year]  Today's TOC FU Call Status: Today's TOC FU Call Status:: Unsuccessful Call (3rd Attempt) Unsuccessful Call (3rd Attempt) Date: 01/08/24  Attempted to reach the patient regarding the most recent Inpatient/ED visit.  Follow Up Plan: No further outreach attempts will be made at this time. We have been unable to contact the patient.  Mliss Creed [facility], BSN RN Care Manager/ Transition of Care [place]/ [facility] [phone]

## 2024-01-08 NOTE — Telephone Encounter (Signed)
 Copied from CRM [id number]. Topic: Referral - Status >> Jan 08, 2024  9:19 AM Harlene ORN wrote: Reason for CRM: Lorie [facility] Calling to request and update for her verbal order for PT.  Phone: [phone]  Call and left VM stating verbal given per Dr Wendolyn on 10/21.

## 2024-01-13 ENCOUNTER — Other Ambulatory Visit: Payer: Self-pay | Admitting: Family Medicine

## 2024-01-13 DIAGNOSIS — M315 Giant cell arteritis with polymyalgia rheumatica: Secondary | ICD-10-CM | POA: Diagnosis not present

## 2024-01-13 DIAGNOSIS — F419 Anxiety disorder, unspecified: Secondary | ICD-10-CM | POA: Diagnosis not present

## 2024-01-13 DIAGNOSIS — F32A Depression, unspecified: Secondary | ICD-10-CM | POA: Diagnosis not present

## 2024-01-13 DIAGNOSIS — F0393 Unspecified dementia, unspecified severity, with mood disturbance: Secondary | ICD-10-CM | POA: Diagnosis not present

## 2024-01-13 DIAGNOSIS — F0394 Unspecified dementia, unspecified severity, with anxiety: Secondary | ICD-10-CM | POA: Diagnosis not present

## 2024-01-13 DIAGNOSIS — M5415 Radiculopathy, thoracolumbar region: Secondary | ICD-10-CM | POA: Diagnosis not present

## 2024-01-13 MED ORDER — POLYETHYLENE GLYCOL 3350 17 G PO PACK: 17.0000 g | PACK | Freq: Every day | ORAL | 3 refills | Status: AC

## 2024-01-13 MED ORDER — DIAZEPAM 5 MG PO TABS: 5.0000 mg | ORAL_TABLET | ORAL | 1 refills | Status: AC

## 2024-01-13 MED ORDER — SYSTANE 0.4-0.3 % OP SOLN: 1.0000 [drp] | Freq: Four times a day (QID) | OPHTHALMIC | 3 refills | Status: AC | PRN

## 2024-01-13 MED ORDER — "SYRINGE 25G X 1"" 3 ML MISC": 0 refills | Status: DC

## 2024-01-13 MED ORDER — ONDANSETRON 8 MG PO TBDP: 8.0000 mg | ORAL_TABLET | Freq: Three times a day (TID) | ORAL | 1 refills | Status: AC | PRN

## 2024-01-13 NOTE — Telephone Encounter (Signed)
 Patient requested refill of calcium -vitamin D . This RN attempted to reorder requested medication. This RN received error message stating new orders must be placed for this medication.

## 2024-01-13 NOTE — Telephone Encounter (Signed)
 Copied from CRM #[id number]. Topic: Clinical - Medication Refill >> Jan 13, 2024  9:35 AM Alfonso ORN wrote: Medication: diazepam  (VALIUM ) 5 MG tablet   ondansetron  (ZOFRAN -ODT) 8 MG disintegrating tablet polyethylene glycol (MIRALAX / GLYCOLAX) 17 g packet 3ml syringes  calcium -vitamin D  (OSCAL WITH D) 500-200 MG-UNIT tablet Systane 0.4 -0.3% SOLN   Has the patient contacted their pharmacy? Yes   This is the patient's preferred pharmacy:  [id number] - [place], [place] - [address] [place] [address] Phone: [phone] Fax: [phone]  [facility] - [address] [place] [address] [place] [address] Phone: [phone] Fax: [phone]  Is this the correct pharmacy for this prescription? Yes  Has the prescription been filled recently? Yes  Is the patient out of the medication? No  Has the patient been seen for an appointment in the last year OR does the patient have an upcoming appointment? Yes  Can we respond through MyChart? No  [facility] calling for refill on behalf of pt

## 2024-01-15 ENCOUNTER — Other Ambulatory Visit: Payer: Self-pay | Admitting: Family Medicine

## 2024-01-15 ENCOUNTER — Ambulatory Visit: Admitting: Family Medicine

## 2024-01-15 DIAGNOSIS — F419 Anxiety disorder, unspecified: Secondary | ICD-10-CM | POA: Diagnosis not present

## 2024-01-15 DIAGNOSIS — F32A Depression, unspecified: Secondary | ICD-10-CM | POA: Diagnosis not present

## 2024-01-15 DIAGNOSIS — F0393 Unspecified dementia, unspecified severity, with mood disturbance: Secondary | ICD-10-CM | POA: Diagnosis not present

## 2024-01-15 DIAGNOSIS — M315 Giant cell arteritis with polymyalgia rheumatica: Secondary | ICD-10-CM | POA: Diagnosis not present

## 2024-01-15 DIAGNOSIS — F0394 Unspecified dementia, unspecified severity, with anxiety: Secondary | ICD-10-CM | POA: Diagnosis not present

## 2024-01-15 DIAGNOSIS — M5415 Radiculopathy, thoracolumbar region: Secondary | ICD-10-CM | POA: Diagnosis not present

## 2024-01-21 ENCOUNTER — Telehealth: Payer: Self-pay

## 2024-01-21 DIAGNOSIS — F0393 Unspecified dementia, unspecified severity, with mood disturbance: Secondary | ICD-10-CM | POA: Diagnosis not present

## 2024-01-21 DIAGNOSIS — F32A Depression, unspecified: Secondary | ICD-10-CM | POA: Diagnosis not present

## 2024-01-21 DIAGNOSIS — F419 Anxiety disorder, unspecified: Secondary | ICD-10-CM | POA: Diagnosis not present

## 2024-01-21 DIAGNOSIS — F0394 Unspecified dementia, unspecified severity, with anxiety: Secondary | ICD-10-CM | POA: Diagnosis not present

## 2024-01-21 DIAGNOSIS — M5415 Radiculopathy, thoracolumbar region: Secondary | ICD-10-CM | POA: Diagnosis not present

## 2024-01-21 DIAGNOSIS — M315 Giant cell arteritis with polymyalgia rheumatica: Secondary | ICD-10-CM | POA: Diagnosis not present

## 2024-01-21 NOTE — Telephone Encounter (Signed)
 Per Porsche (LPN) patient and speaking with daughter [name].. patient is not coming to appointment on 11/6... Porsche spoke with patients daughter Donzell.SABRASABRA

## 2024-01-22 ENCOUNTER — Other Ambulatory Visit: Payer: Self-pay | Admitting: Family Medicine

## 2024-01-22 ENCOUNTER — Inpatient Hospital Stay: Admitting: Hematology and Oncology

## 2024-01-22 DIAGNOSIS — F0393 Unspecified dementia, unspecified severity, with mood disturbance: Secondary | ICD-10-CM | POA: Diagnosis not present

## 2024-01-22 DIAGNOSIS — E781 Pure hyperglyceridemia: Secondary | ICD-10-CM

## 2024-01-22 DIAGNOSIS — F0394 Unspecified dementia, unspecified severity, with anxiety: Secondary | ICD-10-CM | POA: Diagnosis not present

## 2024-01-22 DIAGNOSIS — F419 Anxiety disorder, unspecified: Secondary | ICD-10-CM | POA: Diagnosis not present

## 2024-01-22 DIAGNOSIS — M315 Giant cell arteritis with polymyalgia rheumatica: Secondary | ICD-10-CM | POA: Diagnosis not present

## 2024-01-22 DIAGNOSIS — F32A Depression, unspecified: Secondary | ICD-10-CM | POA: Diagnosis not present

## 2024-01-22 DIAGNOSIS — M5415 Radiculopathy, thoracolumbar region: Secondary | ICD-10-CM | POA: Diagnosis not present

## 2024-01-22 MED ORDER — OMEGA-3-ACID ETHYL ESTERS 1 G PO CAPS: 2.0000 | ORAL_CAPSULE | Freq: Two times a day (BID) | ORAL | 3 refills | Status: AC

## 2024-01-22 MED ORDER — SPIRIVA RESPIMAT 2.5 MCG/ACT IN AERS: 2.0000 | INHALATION_SPRAY | Freq: Every day | RESPIRATORY_TRACT | 3 refills | Status: AC

## 2024-01-22 MED ORDER — ROPINIROLE HCL 1 MG PO TABS: 1.0000 mg | ORAL_TABLET | ORAL | 3 refills | Status: AC

## 2024-01-23 DIAGNOSIS — M5415 Radiculopathy, thoracolumbar region: Secondary | ICD-10-CM | POA: Diagnosis not present

## 2024-01-23 DIAGNOSIS — F419 Anxiety disorder, unspecified: Secondary | ICD-10-CM | POA: Diagnosis not present

## 2024-01-23 DIAGNOSIS — F32A Depression, unspecified: Secondary | ICD-10-CM | POA: Diagnosis not present

## 2024-01-23 DIAGNOSIS — M315 Giant cell arteritis with polymyalgia rheumatica: Secondary | ICD-10-CM | POA: Diagnosis not present

## 2024-01-23 DIAGNOSIS — F0393 Unspecified dementia, unspecified severity, with mood disturbance: Secondary | ICD-10-CM | POA: Diagnosis not present

## 2024-01-23 DIAGNOSIS — F0394 Unspecified dementia, unspecified severity, with anxiety: Secondary | ICD-10-CM | POA: Diagnosis not present

## 2024-01-27 DIAGNOSIS — M5415 Radiculopathy, thoracolumbar region: Secondary | ICD-10-CM | POA: Diagnosis not present

## 2024-01-27 DIAGNOSIS — M315 Giant cell arteritis with polymyalgia rheumatica: Secondary | ICD-10-CM | POA: Diagnosis not present

## 2024-01-27 DIAGNOSIS — F0393 Unspecified dementia, unspecified severity, with mood disturbance: Secondary | ICD-10-CM | POA: Diagnosis not present

## 2024-01-27 DIAGNOSIS — F32A Depression, unspecified: Secondary | ICD-10-CM | POA: Diagnosis not present

## 2024-01-27 DIAGNOSIS — F0394 Unspecified dementia, unspecified severity, with anxiety: Secondary | ICD-10-CM | POA: Diagnosis not present

## 2024-01-27 DIAGNOSIS — F419 Anxiety disorder, unspecified: Secondary | ICD-10-CM | POA: Diagnosis not present

## 2024-01-28 ENCOUNTER — Ambulatory Visit: Admitting: Family Medicine

## 2024-01-29 DIAGNOSIS — M315 Giant cell arteritis with polymyalgia rheumatica: Secondary | ICD-10-CM | POA: Diagnosis not present

## 2024-01-29 DIAGNOSIS — F419 Anxiety disorder, unspecified: Secondary | ICD-10-CM | POA: Diagnosis not present

## 2024-01-29 DIAGNOSIS — F0393 Unspecified dementia, unspecified severity, with mood disturbance: Secondary | ICD-10-CM | POA: Diagnosis not present

## 2024-01-29 DIAGNOSIS — F32A Depression, unspecified: Secondary | ICD-10-CM | POA: Diagnosis not present

## 2024-01-29 DIAGNOSIS — F0394 Unspecified dementia, unspecified severity, with anxiety: Secondary | ICD-10-CM | POA: Diagnosis not present

## 2024-01-29 DIAGNOSIS — M5415 Radiculopathy, thoracolumbar region: Secondary | ICD-10-CM | POA: Diagnosis not present

## 2024-02-03 ENCOUNTER — Telehealth: Payer: Self-pay | Admitting: Family Medicine

## 2024-02-03 DIAGNOSIS — Z79899 Other long term (current) drug therapy: Secondary | ICD-10-CM | POA: Diagnosis not present

## 2024-02-03 DIAGNOSIS — M810 Age-related osteoporosis without current pathological fracture: Secondary | ICD-10-CM | POA: Diagnosis not present

## 2024-02-03 DIAGNOSIS — M2550 Pain in unspecified joint: Secondary | ICD-10-CM | POA: Diagnosis not present

## 2024-02-03 DIAGNOSIS — M316 Other giant cell arteritis: Secondary | ICD-10-CM | POA: Diagnosis not present

## 2024-02-03 NOTE — Telephone Encounter (Signed)
 [facility] faxed Home Health Certificate (Order ID [id number]), to be filled out by provider. Patient requested to send it back via Fax within ASAP. Document is located in providers tray at front office.Please advise at [phone].

## 2024-02-04 ENCOUNTER — Ambulatory Visit: Admitting: Family Medicine

## 2024-02-24 ENCOUNTER — Ambulatory Visit: Admitting: Orthopedic Surgery

## 2024-02-24 ENCOUNTER — Other Ambulatory Visit: Payer: Self-pay

## 2024-02-24 DIAGNOSIS — S52531A Colles' fracture of right radius, initial encounter for closed fracture: Secondary | ICD-10-CM

## 2024-02-24 DIAGNOSIS — M25531 Pain in right wrist: Secondary | ICD-10-CM | POA: Diagnosis not present

## 2024-02-24 DIAGNOSIS — N958 Other specified menopausal and perimenopausal disorders: Secondary | ICD-10-CM | POA: Diagnosis not present

## 2024-02-24 DIAGNOSIS — N3020 Other chronic cystitis without hematuria: Secondary | ICD-10-CM | POA: Diagnosis not present

## 2024-02-24 NOTE — Progress Notes (Signed)
 [name] - 89 y.o. female MRN [id number]  Date of birth: 11-Feb-1930  Office Visit Note: Visit Date: 02/24/2024 PCP: Sally Jenkins Jansky, MD Referred by: Sally Jenkins Jansky, MD  Subjective: No chief complaint on file.  HPI: [name] is a pleasant 89 y.o. female who presents today with her daughter regarding a right wrist distal radius fracture sustained approximately 6 weeks prior.  This was the result of a mechanical fall.  She has been in a wrist splint since that time, has been able to perform digital motion, pain is improving.  Pertinent ROS were reviewed with the patient and found to be negative unless otherwise specified above in HPI.   Visit Reason: right wrist distal radius fracture Duration of symptoms: October Hand dominance: right Occupation: retired Diabetic: No Smoking: No Heart/Lung History: none Blood Thinners:  aspirin   Prior Testing/EMG: xrays  Injections (Date): none Treatments: splint Prior Surgery:none    Assessment & Plan: Visit Diagnoses:  1. Closed Colles' fracture of right radius, initial encounter   2. Pain in right wrist     Plan: Extensive discussion was had with the patient and her daughter today regarding her right wrist distal radius fracture.  Repeat x-rays were obtained today which do show appropriate interval healing of the right distal radius, there is residual shortening with associated ulnar positivity as a result of the distal radius fracture.  This was demonstrated to the daughter on x-rays today.  She has maintained relatively good motion of the right wrist on examination today, pain is controlled, minimal tenderness in this region.  We can transition to a removable wrist brace at this time.  She can remove this for gentle range of motion exercises of the right wrist, utilize wrist brace for additional 2 to 4 weeks.  She can begin to wean from it at that point in start to resume activities as tolerated.  Follow-up with myself in  approximately 6 weeks time.  Daughter expressed full understanding today.  Follow-up: No follow-ups on file.   Meds & Orders: No orders of the defined types were placed in this encounter.   Orders Placed This Encounter  Procedures   XR Wrist Complete Right     Procedures: No procedures performed      Clinical History: No specialty comments available.  She reports that she has never smoked. She has never used smokeless tobacco. No results for input(s): HGBA1C, LABURIC in the last 8760 hours.  Objective:   Vital Signs: There were no vitals taken for this visit.  Physical Exam  Gen: Well-appearing, in no acute distress; non-toxic CV: Regular Rate. Well-perfused. Warm.  Resp: Breathing unlabored on room air; no wheezing. Psych: Fluid speech in conversation; appropriate affect; normal thought process  Ortho Exam Right wrist: - Skin is intact, minimal swelling, mild tenderness at the distal radius region - Digital range of motion is preserved - Sensation intact in median/radial/ulnar distributions, AIN/PIN/interosseous intact   Imaging: XR Wrist Complete Right Result Date: 02/24/2024 X-rays of the right wrist demonstrate appropriate interval healing of the distal radius fracture seen on prior radiographs.  There is notable callus formation.  Residual ulnar positivity has resulted from the shortening of the distal radius as a result of the fracture.  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   Past Medical/Family/Surgical/Social History: Medications & Allergies reviewed per EMR, new medications updated. Patient Active Problem List   Diagnosis Date Noted   Near syncope 12/30/2023   Hypotension 12/30/2023   Distal radius fracture, right  12/30/2023   Liver lesion 12/30/2023   UTI (urinary tract infection) 12/30/2023   Age-related cognitive decline 09/29/2022   RLS (restless legs syndrome) 08/30/2021   Chronic constipation 08/30/2021   Cervicalgia 08/30/2021    Immunocompromised 09/01/2020   Acute vulvitis 07/31/2018   Mixed stress and urge urinary incontinence 09/04/2017   Elevated hemoglobin 04/04/2017   Chronic back pain 01/28/2017   Morbid obesity (HCC) 01/28/2017   Peptic ulcer disease    Lumbar radiculitis    Frequent falls 08/02/2016   Hypertensive retinopathy of both eyes 09/07/2015   Medicare annual wellness visit, subsequent 07/11/2015   Long-term use of high-risk medication 07/11/2015   Osteopenia 04/11/2015   Depression with anxiety 04/11/2015   Giant cell arteritis (HCC) 03/28/2015   Polymyalgia rheumatica 03/16/2015   Chronic narcotic use 03/16/2015   Anemia, iron deficiency 07/14/2013   Asthma, moderate persistent 10/15/2012   Hypertriglyceridemia 12/27/2008   HYPERTENSION, BENIGN 12/27/2008   Past Medical History:  Diagnosis Date   Abdominal bloating    Abnormal LFTs (liver function tests)    Ankle fracture, bimalleolar, closed, right, initial encounter    Anxiety disorder    Arthritis    Asthma    Bimalleolar ankle fracture, right, closed, initial encounter 01/06/2019   Depression    Diverticulosis    Edema    Fatty liver    Fibromyalgia    GERD (gastroesophageal reflux disease)    Giant cell arteritis (HCC)    No biopsy secondary to steroid use   Hx of adenomatous colonic polyps    Hyperlipidemia    Hypertension    IBS (irritable bowel syndrome)    Lumbar radiculitis    Osteoporosis 09/2017   T score -2.6 left femoral neck   Peptic ulcer disease    Skin cancer    shoulder, leg, right eye (? squamous by resection)   Thoracic radiculitis    Thyroid  nodule    removed   Urinary incontinence    Vertigo    Family History  Problem Relation Age of Onset   Colon polyps Maternal Aunt    Diabetes Father    Heart disease Father    Heart disease Mother    Lung disease Mother    COPD Mother    Lung cancer Brother    Cancer Brother        lung   COPD Brother    Stroke Brother    Lung cancer Sister     COPD Sister    Kidney disease Other        niece-mat side   Arthritis Other    Diabetes Son    Arthritis Daughter    Colon cancer Neg Hx    Past Surgical History:  Procedure Laterality Date   ABDOMINAL HYSTERECTOMY     BSO   BACK SURGERY  2005   lower back   EYE SURGERY Right 2006   LIVER BIOPSY     fatty liver   NASAL SEPTUM SURGERY     ORIF ANKLE FRACTURE Right 01/11/2019   Procedure: OPEN REDUCTION INTERNAL FIXATION (ORIF) RIGHT BIMALLEOLAR ANKLE FRACTURE;  Surgeon: Jerri Kay HERO, MD;  Location: [facility];  Service: Orthopedics;  Laterality: Right;   right knee surgery  2006   arthroscopy   right leg skin cancer surgery  2014   right shoulder skin cancer excision  2012   SKIN CANCER EXCISION     THYROIDECTOMY     Social History   Occupational History   Occupation:  Retired Chief Financial Officer  Tobacco Use   Smoking status: Never   Smokeless tobacco: Never  Vaping Use   Vaping status: Never Used  Substance and Sexual Activity   Alcohol use: No   Drug use: No   Sexual activity: Not Currently    Comment: intercourser age 48, less than 5 secxual partners    Chalise Pe Estela) Arlinda, M.D. [facility], Hand Surgery

## 2024-02-25 ENCOUNTER — Inpatient Hospital Stay (HOSPITAL_COMMUNITY)
Admission: EM | Admit: 2024-02-25 | Discharge: 2024-02-28 | DRG: 535 | Disposition: A | Attending: Internal Medicine | Admitting: Internal Medicine

## 2024-02-25 ENCOUNTER — Other Ambulatory Visit: Payer: Self-pay

## 2024-02-25 ENCOUNTER — Emergency Department (HOSPITAL_COMMUNITY)

## 2024-02-25 DIAGNOSIS — N3000 Acute cystitis without hematuria: Secondary | ICD-10-CM

## 2024-02-25 DIAGNOSIS — M25552 Pain in left hip: Secondary | ICD-10-CM | POA: Diagnosis not present

## 2024-02-25 DIAGNOSIS — M316 Other giant cell arteritis: Secondary | ICD-10-CM | POA: Diagnosis not present

## 2024-02-25 DIAGNOSIS — J441 Chronic obstructive pulmonary disease with (acute) exacerbation: Secondary | ICD-10-CM | POA: Insufficient documentation

## 2024-02-25 DIAGNOSIS — M353 Polymyalgia rheumatica: Secondary | ICD-10-CM | POA: Diagnosis present

## 2024-02-25 DIAGNOSIS — S32592A Other specified fracture of left pubis, initial encounter for closed fracture: Secondary | ICD-10-CM | POA: Diagnosis not present

## 2024-02-25 DIAGNOSIS — M79603 Pain in arm, unspecified: Secondary | ICD-10-CM | POA: Diagnosis not present

## 2024-02-25 DIAGNOSIS — S2231XA Fracture of one rib, right side, initial encounter for closed fracture: Secondary | ICD-10-CM

## 2024-02-25 DIAGNOSIS — S329XXA Fracture of unspecified parts of lumbosacral spine and pelvis, initial encounter for closed fracture: Principal | ICD-10-CM

## 2024-02-25 DIAGNOSIS — I10 Essential (primary) hypertension: Secondary | ICD-10-CM | POA: Diagnosis not present

## 2024-02-25 DIAGNOSIS — S2232XA Fracture of one rib, left side, initial encounter for closed fracture: Secondary | ICD-10-CM | POA: Diagnosis not present

## 2024-02-25 LAB — CBC
HCT: 35.5 % — ABNORMAL LOW (ref 36.0–46.0)
Hemoglobin: 10.9 g/dL — ABNORMAL LOW (ref 12.0–15.0)
MCH: 30.9 pg (ref 26.0–34.0)
MCHC: 30.7 g/dL (ref 30.0–36.0)
MCV: 100.6 fL — ABNORMAL HIGH (ref 80.0–100.0)
Platelets: 175 K/uL (ref 150–400)
RBC: 3.53 MIL/uL — ABNORMAL LOW (ref 3.87–5.11)
RDW: 15.3 % (ref 11.5–15.5)
WBC: 11.2 K/uL — ABNORMAL HIGH (ref 4.0–10.5)
nRBC: 0 % (ref 0.0–0.2)

## 2024-02-25 LAB — URINALYSIS, ROUTINE W REFLEX MICROSCOPIC
Bilirubin Urine: NEGATIVE
Glucose, UA: NEGATIVE mg/dL
Ketones, ur: 5 mg/dL — AB
Nitrite: NEGATIVE
Protein, ur: 100 mg/dL — AB
Specific Gravity, Urine: 1.015 (ref 1.005–1.030)
pH: 6 (ref 5.0–8.0)

## 2024-02-25 LAB — RESPIRATORY PANEL BY PCR

## 2024-02-25 LAB — BASIC METABOLIC PANEL WITH GFR
Anion gap: 11 (ref 5–15)
BUN: 15 mg/dL (ref 8–23)
CO2: 29 mmol/L (ref 22–32)
Calcium: 9.1 mg/dL (ref 8.9–10.3)
Chloride: 102 mmol/L (ref 98–111)
Creatinine, Ser: 0.82 mg/dL (ref 0.44–1.00)
GFR, Estimated: >60 mL/min (ref >60)
Glucose, Bld: 103 mg/dL — ABNORMAL HIGH (ref 70–99)
Potassium: 4.4 mmol/L (ref 3.5–5.1)
Sodium: 142 mmol/L (ref 135–145)

## 2024-02-25 LAB — TSH: TSH: 1.91 u[IU]/mL (ref 0.350–4.500)

## 2024-02-25 LAB — RESP PANEL BY RT-PCR (RSV, FLU A&B, COVID)  RVPGX2
Influenza A by PCR: NEGATIVE
Influenza B by PCR: NEGATIVE
Resp Syncytial Virus by PCR: NEGATIVE
SARS Coronavirus 2 by RT PCR: NEGATIVE

## 2024-02-25 LAB — PROCALCITONIN: Procalcitonin: 0.14 ng/mL

## 2024-02-25 LAB — T4, FREE: Free T4: 1.08 ng/dL (ref 0.61–1.12)

## 2024-02-25 LAB — FOLATE: Folate: 7 ng/mL (ref >5.9)

## 2024-02-25 LAB — VITAMIN B12: Vitamin B-12: 1134 pg/mL — ABNORMAL HIGH (ref 180–914)

## 2024-02-25 MED ORDER — BUPROPION HCL ER (XL) 150 MG PO TB24
150.0000 mg | ORAL_TABLET | Freq: Every morning | ORAL | Status: DC
  Administered 2024-02-26 – 2024-02-28 (×3): 150 mg via ORAL
  Filled 2024-02-25 (×3): qty 1

## 2024-02-25 MED ORDER — POLYVINYL ALCOHOL 1.4 % OP SOLN: 1.0000 [drp] | Freq: Four times a day (QID) | OPHTHALMIC | Status: DC | PRN

## 2024-02-25 MED ORDER — IPRATROPIUM-ALBUTEROL 0.5-2.5 (3) MG/3ML IN SOLN
3.0000 mL | Freq: Two times a day (BID) | RESPIRATORY_TRACT | Status: DC
  Administered 2024-02-26 – 2024-02-28 (×5): 3 mL via RESPIRATORY_TRACT
  Filled 2024-02-25 (×5): qty 3

## 2024-02-25 MED ORDER — METHYLPREDNISOLONE SODIUM SUCC 125 MG IJ SOLR
60.0000 mg | Freq: Two times a day (BID) | INTRAMUSCULAR | Status: DC
  Administered 2024-02-25 – 2024-02-28 (×6): 60 mg via INTRAVENOUS
  Filled 2024-02-25 (×6): qty 2

## 2024-02-25 MED ORDER — MORPHINE SULFATE (PF) 4 MG/ML IV SOLN
2.0000 mg | Freq: Once | INTRAVENOUS | Status: AC
  Administered 2024-02-25: 2 mg via INTRAVENOUS
  Filled 2024-02-25: qty 1

## 2024-02-25 MED ORDER — ONDANSETRON HCL 4 MG/2ML IJ SOLN: 4.0000 mg | Freq: Four times a day (QID) | INTRAMUSCULAR | Status: DC | PRN

## 2024-02-25 MED ORDER — ALBUTEROL SULFATE (2.5 MG/3ML) 0.083% IN NEBU: 2.5000 mg | INHALATION_SOLUTION | Freq: Four times a day (QID) | RESPIRATORY_TRACT | Status: DC | PRN

## 2024-02-25 MED ORDER — MEMANTINE HCL 10 MG PO TABS
5.0000 mg | ORAL_TABLET | Freq: Two times a day (BID) | ORAL | Status: DC
  Administered 2024-02-25 – 2024-02-28 (×6): 5 mg via ORAL
  Filled 2024-02-25 (×6): qty 1

## 2024-02-25 MED ORDER — ROPINIROLE HCL 1 MG PO TABS: 1.0000 mg | ORAL_TABLET | ORAL | Status: DC

## 2024-02-25 MED ORDER — POLYETHYLENE GLYCOL 3350 17 G PO PACK
17.0000 g | PACK | Freq: Every day | ORAL | Status: DC
  Administered 2024-02-26 – 2024-02-28 (×3): 17 g via ORAL
  Filled 2024-02-25 (×3): qty 1

## 2024-02-25 MED ORDER — ROPINIROLE HCL 1 MG PO TABS
1.0000 mg | ORAL_TABLET | Freq: Every day | ORAL | Status: DC
  Administered 2024-02-26 – 2024-02-28 (×3): 1 mg via ORAL
  Filled 2024-02-25 (×3): qty 1

## 2024-02-25 MED ORDER — ASPIRIN 81 MG PO TBEC
81.0000 mg | DELAYED_RELEASE_TABLET | Freq: Every day | ORAL | Status: DC
  Administered 2024-02-25 – 2024-02-28 (×4): 81 mg via ORAL
  Filled 2024-02-25 (×4): qty 1

## 2024-02-25 MED ORDER — POLYETHYL GLYCOL-PROPYL GLYCOL 0.4-0.3 % OP SOLN: 1.0000 [drp] | Freq: Four times a day (QID) | OPHTHALMIC | Status: DC | PRN

## 2024-02-25 MED ORDER — OMEGA-3-ACID ETHYL ESTERS 1 G PO CAPS
2.0000 | ORAL_CAPSULE | Freq: Two times a day (BID) | ORAL | Status: DC
  Administered 2024-02-25 – 2024-02-28 (×6): 2 g via ORAL
  Filled 2024-02-25 (×6): qty 2

## 2024-02-25 MED ORDER — BUDESONIDE 0.5 MG/2ML IN SUSP
0.5000 mg | Freq: Two times a day (BID) | RESPIRATORY_TRACT | Status: DC
  Administered 2024-02-25 – 2024-02-28 (×6): 0.5 mg via RESPIRATORY_TRACT
  Filled 2024-02-25 (×6): qty 2

## 2024-02-25 MED ORDER — HYDROCODONE-ACETAMINOPHEN 5-325 MG PO TABS
1.0000 | ORAL_TABLET | Freq: Four times a day (QID) | ORAL | Status: DC | PRN
  Administered 2024-02-26 – 2024-02-27 (×2): 1 via ORAL
  Filled 2024-02-25 (×2): qty 1

## 2024-02-25 MED ORDER — DOXYCYCLINE HYCLATE 100 MG PO TABS
100.0000 mg | ORAL_TABLET | Freq: Two times a day (BID) | ORAL | Status: DC
  Administered 2024-02-25 – 2024-02-28 (×6): 100 mg via ORAL
  Filled 2024-02-25 (×6): qty 1

## 2024-02-25 MED ORDER — SODIUM CHLORIDE 0.9 % IV SOLN
1.0000 g | INTRAVENOUS | Status: DC
  Administered 2024-02-25 – 2024-02-27 (×3): 1 g via INTRAVENOUS
  Filled 2024-02-25 (×3): qty 10

## 2024-02-25 MED ORDER — PANTOPRAZOLE SODIUM 40 MG PO TBEC
40.0000 mg | DELAYED_RELEASE_TABLET | Freq: Every day | ORAL | Status: DC
  Administered 2024-02-26 – 2024-02-28 (×3): 40 mg via ORAL
  Filled 2024-02-25 (×3): qty 1

## 2024-02-25 MED ORDER — IPRATROPIUM-ALBUTEROL 0.5-2.5 (3) MG/3ML IN SOLN
3.0000 mL | Freq: Four times a day (QID) | RESPIRATORY_TRACT | Status: DC
  Administered 2024-02-25: 3 mL via RESPIRATORY_TRACT
  Filled 2024-02-25: qty 3

## 2024-02-25 MED ORDER — SERTRALINE HCL 50 MG PO TABS
50.0000 mg | ORAL_TABLET | Freq: Every morning | ORAL | Status: DC
  Administered 2024-02-26 – 2024-02-28 (×3): 50 mg via ORAL
  Filled 2024-02-25 (×3): qty 1

## 2024-02-25 MED ORDER — ACETAMINOPHEN 650 MG RE SUPP: 650.0000 mg | Freq: Four times a day (QID) | RECTAL | Status: DC | PRN

## 2024-02-25 MED ORDER — ONDANSETRON HCL 4 MG PO TABS: 4.0000 mg | ORAL_TABLET | Freq: Four times a day (QID) | ORAL | Status: DC | PRN

## 2024-02-25 MED ORDER — ARFORMOTEROL TARTRATE 15 MCG/2ML IN NEBU
15.0000 ug | INHALATION_SOLUTION | Freq: Two times a day (BID) | RESPIRATORY_TRACT | Status: DC
  Administered 2024-02-25 – 2024-02-28 (×6): 15 ug via RESPIRATORY_TRACT
  Filled 2024-02-25 (×6): qty 2

## 2024-02-25 MED ORDER — ROPINIROLE HCL 1 MG PO TABS
2.0000 mg | ORAL_TABLET | Freq: Every day | ORAL | Status: DC
  Administered 2024-02-25 – 2024-02-27 (×3): 2 mg via ORAL
  Filled 2024-02-25 (×3): qty 2

## 2024-02-25 MED ORDER — ARIPIPRAZOLE 2 MG PO TABS
2.0000 mg | ORAL_TABLET | Freq: Every day | ORAL | Status: DC
  Administered 2024-02-26 – 2024-02-28 (×3): 2 mg via ORAL
  Filled 2024-02-25 (×4): qty 1

## 2024-02-25 MED ORDER — ACETAMINOPHEN 325 MG PO TABS: 650.0000 mg | ORAL_TABLET | Freq: Four times a day (QID) | ORAL | Status: DC | PRN

## 2024-02-25 MED ORDER — HEPARIN SODIUM (PORCINE) 5000 UNIT/ML IJ SOLN
5000.0000 [IU] | Freq: Three times a day (TID) | INTRAMUSCULAR | Status: DC
  Administered 2024-02-25 – 2024-02-28 (×8): 5000 [IU] via SUBCUTANEOUS
  Filled 2024-02-25 (×8): qty 1

## 2024-02-25 NOTE — Evaluation (Signed)
 Physical Therapy Evaluation Patient Details Name: [name] MRN: [id number] DOB: 05-28-1929 Today's Date: 02/25/2024  History of Present Illness  [name] is a 89 year old female with a history of asthma, chronic respiratory failure on 2 L, dementia, presumed metastatic colon cancer, trans arteritis on prednisone , B12 deficiency, RLS, pulm nodule rheumatica presenting with wheezing shortness of breath and a fall.  The patient's daughter at the bedside supplements the history.  The patient is a difficult historian secondary to her dementia.  The patient was recently admitted to the hospital from 12/30/2023 to 01/03/2024 after sustaining mechanical fall and right radial fracture.  She was managed nonoperatively.  During that hospitalization, she was noted to have transverse colon thickening with liver mass.  A biopsy was not pursued.  Family does not wish to pursue this any further given the patient's dementia and advanced age.  She was discharged home with home health physical therapy.  The patient did make some improvements and was able to bear weight at once again.  Unfortunately, she used her wheelchair as a walker on the day of admission and sustained a mechanical fall going down a ramp.  It was unclear whether the patient had syncope.  The patient's son heard the patient yell after the fall.  Daughter states that the patient has had more wheezing and some shortness of breath in the past week.  There is not been any complaints of fevers, chills, hemoptysis, nausea, vomiting, diarrhea abdominal pain, chest pain.  In the ED, the patient was afebrile and hemodynamically stable with oxygen  saturation 98% on 2 L.  WBC 11.2, hemoglobin 10.9, platelets 175.  Sodium 142, potassium 4.4, bicarbonate 29, serum creatinine 0.82.  X-ray of the pelvis showed comminuted left pubic bone fracture including the superior pubic ramus medially.  X-ray of the ribs shows a displaced right lateral rib fracture without  pneumothorax.  There was a suggestion of possible left lower lobe opacity.  UA showed 11-20 WBC.  EKG showed sinus rhythm and IVCD.   Clinical Impression  Patient demonstrates slow labored movement for sitting up at bedside with c/o increasing pain left groin/hip area and had difficulty moving LLE and scooting to EOB. Patient able to stand and tolerated some weightbearing on LLE using RW, limited to a few side steps before having to sit due to fatigue, left hip/groin pain and fall risk. Patient tolerated sitting up in chair after therapy. Patient will benefit from continued skilled physical therapy in hospital and recommended venue below to increase strength, balance, endurance for safe ADLs and gait.           If plan is discharge home, recommend the following: A lot of help with bathing/dressing/bathroom;A lot of help with walking and/or transfers;Help with stairs or ramp for entrance;Assist for transportation;Assistance with cooking/housework   Can travel by private vehicle   No    Equipment Recommendations None recommended by PT  Recommendations for Other Services       Functional Status Assessment Patient has had a recent decline in their functional status and demonstrates the ability to make significant improvements in function in a reasonable and predictable amount of time.     Precautions / Restrictions Precautions Precautions: Fall Recall of Precautions/Restrictions: Intact Restrictions Weight Bearing Restrictions Per Provider Order: No      Mobility  Bed Mobility Overal bed mobility: Needs Assistance Bed Mobility: Supine to Sit     Supine to sit: Mod assist     General bed mobility comments: increased time, labored  movemnt with most difficulty scooting forward and moving LLE    Transfers Overall transfer level: Needs assistance Equipment used: Rolling walker (2 wheels) Transfers: Sit to/from Stand, Bed to chair/wheelchair/BSC Sit to Stand: Min assist, Mod  assist   Step pivot transfers: Mod assist       General transfer comment: unsteady labored movement, tolerated some weightbearing on LLE    Ambulation/Gait Ambulation/Gait assistance: Mod assist Gait Distance (Feet): 4 Feet Assistive device: Rolling walker (2 wheels) Gait Pattern/deviations: Decreased step length - right, Decreased step length - left, Decreased stride length, Antalgic, Decreased stance time - left Gait velocity: slow     General Gait Details: limited to a few slow labored side steps at bedside with limited weightbearing on LLE due to increasing hip/groin pain  Stairs            Wheelchair Mobility     Tilt Bed    Modified Rankin (Stroke Patients Only)       Balance Overall balance assessment: Needs assistance Sitting-balance support: Feet supported, No upper extremity supported Sitting balance-Leahy Scale: Fair Sitting balance - Comments: fair/good seated at EOB   Standing balance support: Reliant on assistive device for balance, During functional activity, Bilateral upper extremity supported Standing balance-Leahy Scale: Poor Standing balance comment: using RW                             Pertinent Vitals/Pain Pain Assessment Pain Assessment: Faces Faces Pain Scale: Hurts even more Pain Location: right hip groin area with movement Pain Descriptors / Indicators: Discomfort, Grimacing, Guarding, Sharp Pain Intervention(s): Limited activity within patient's tolerance, Monitored during session, Repositioned    Home Living Family/patient expects to be discharged to:: Private residence Living Arrangements: Children Available Help at Discharge: Family;Available PRN/intermittently Type of Home: House Home Access: Ramped entrance       Home Layout: One level Home Equipment: Rollator (4 wheels);BSC/3in1;Shower seat;Wheelchair - manual Additional Comments: Pt's daughter was present and assisted with providing info regarding the pt's  prior level of functioning and living situation.    Prior Function Prior Level of Function : Needs assist       Physical Assist : Mobility (physical);ADLs (physical) Mobility (physical): Bed mobility;Transfers;Gait;Stairs   Mobility Comments: household ambulation using Rollator, uses wheelchair for longer distances ADLs Comments: The pt required assistance for lower body dressing and bathing, her family managed the cooking and cleaning, she performed toileting without the need for assistance, and she does not drive. She used 2L O2 as needed during the day & most all the time at night.     Extremity/Trunk Assessment   Upper Extremity Assessment Upper Extremity Assessment: Generalized weakness    Lower Extremity Assessment Lower Extremity Assessment: Generalized weakness;LLE deficits/detail LLE Deficits / Details: grossly 3/5 LLE: Unable to fully assess due to pain LLE Sensation: WNL LLE Coordination: WNL    Cervical / Trunk Assessment Cervical / Trunk Assessment: Kyphotic  Communication   Communication Communication: Impaired Factors Affecting Communication: Hearing impaired    Cognition Arousal: Alert Behavior During Therapy: WFL for tasks assessed/performed                             Following commands: Intact       Cueing Cueing Techniques: Verbal cues, Tactile cues     General Comments      Exercises     Assessment/Plan    PT Assessment Patient  needs continued PT services  PT Problem List Decreased strength;Decreased activity tolerance;Decreased balance;Decreased mobility       PT Treatment Interventions DME instruction;Gait training;Stair training;Functional mobility training;Therapeutic activities;Therapeutic exercise;Balance training;Patient/family education    PT Goals (Current goals can be found in the Care Plan section)  Acute Rehab PT Goals Patient Stated Goal: return home with family to assist PT Goal Formulation: With  patient/family Time For Goal Achievement: 03/10/24 Potential to Achieve Goals: Good    Frequency Min 3X/week     Co-evaluation               AM-PAC PT 6 Clicks Mobility  Outcome Measure Help needed turning from your back to your side while in a flat bed without using bedrails?: A Lot Help needed moving from lying on your back to sitting on the side of a flat bed without using bedrails?: A Lot Help needed moving to and from a bed to a chair (including a wheelchair)?: A Lot Help needed standing up from a chair using your arms (e.g., wheelchair or bedside chair)?: A Lot Help needed to walk in hospital room?: A Lot Help needed climbing 3-5 steps with a railing? : Total 6 Click Score: 11    End of Session Equipment Utilized During Treatment: Oxygen  Activity Tolerance: Patient tolerated treatment well;Patient limited by fatigue Patient left: in chair;with call bell/phone within reach;with family/visitor present Nurse Communication: Mobility status PT Visit Diagnosis: Unsteadiness on feet (R26.81);Other abnormalities of gait and mobility (R26.89);Muscle weakness (generalized) (M62.81)    Time: 8497-8466 PT Time Calculation (min) (ACUTE ONLY): 31 min   Charges:   PT Evaluation $PT Eval Moderate Complexity: 1 Mod PT Treatments $Therapeutic Activity: 23-37 mins PT General Charges $$ ACUTE PT VISIT: 1 Visit         3:54 PM, 02/25/24 Lynwood Music, MPT Physical Therapist with [facility] [phone] office [phone] mobile phone

## 2024-02-25 NOTE — Plan of Care (Signed)

## 2024-02-25 NOTE — Plan of Care (Signed)
  Problem: Nutrition: Goal: Adequate nutrition will be maintained Outcome: Progressing   Problem: Coping: Goal: Level of anxiety will decrease Outcome: Progressing   Problem: Pain Managment: Goal: General experience of comfort will improve and/or be controlled Outcome: Progressing

## 2024-02-25 NOTE — TOC Initial Note (Addendum)
 Transition of Care [facility]) - Initial/Assessment Note    Patient Details  Name: [name] MRN: [id number] Date of Birth: 11-12-1929  [facility] CM/SW Contact:    Sharlyne Stabs, RN Phone Number: 02/25/2024, 4:03 PM  Clinical Narrative:           Patient admitted with Closed fracture of multiple pubic rami, left. PT is recommending SNF. CM spoke with daughter, patient is active with [facility] for HHPT.  Patient wants to discharge home when stable. Jennifer with [facility] updated.  IPCM following.    Expected Discharge Plan: Home w Home Health Services Barriers to Discharge: Continued Medical Work up   Patient Goals and CMS Choice Patient states their goals for this hospitalization and ongoing recovery are:: Return home CMS Medicare.gov Compare Post Acute Care list provided to:: Patient Represenative (must comment) Choice offered to / list presented to : Adult Children [employer] ownership interest in [facility].provided to:: Adult Children    Expected Discharge Plan and Services    Home with Home health Prior Living Arrangements/Services     Patient language and need for interpreter reviewed:: Yes Do you feel safe going back to the place where you live?: Yes      Need for Family Participation in Patient Care: Yes (Comment) Care giver support system in place?: Yes (comment) Current home services: DME Criminal Activity/Legal Involvement Pertinent to Current Situation/Hospitalization: No - Comment as needed     Emotional Assessment     Affect (typically observed): Accepting Orientation: : Oriented to Self Alcohol / Substance Use: Not Applicable Psych Involvement: No (comment)  Admission diagnosis:  Acute cystitis without hematuria [N30.00] Closed fracture of one rib of right side, initial encounter [S22.31XA] Closed fracture of multiple pubic rami, left, initial encounter (HCC) [S32.592A] Closed displaced fracture of pelvis, unspecified part  of pelvis, initial encounter (HCC) [S32.9XXA] Patient Active Problem List   Diagnosis Date Noted   Closed fracture of multiple pubic rami, left, initial encounter (HCC) 02/25/2024   Asthma, chronic obstructive, with acute exacerbation (HCC) 02/25/2024   Major neurocognitive disorder (HCC) 02/25/2024   Near syncope 12/30/2023   Hypotension 12/30/2023   Distal radius fracture, right 12/30/2023   Liver lesion 12/30/2023   UTI (urinary tract infection) 12/30/2023   Age-related cognitive decline 09/29/2022   RLS (restless legs syndrome) 08/30/2021   Chronic constipation 08/30/2021   Cervicalgia 08/30/2021   Immunocompromised 09/01/2020   Acute vulvitis 07/31/2018   Mixed stress and urge urinary incontinence 09/04/2017   Elevated hemoglobin 04/04/2017   Chronic back pain 01/28/2017   Morbid obesity (HCC) 01/28/2017   Peptic ulcer disease    Lumbar radiculitis    Frequent falls 08/02/2016   Hypertensive retinopathy of both eyes 09/07/2015   Medicare annual wellness visit, subsequent 07/11/2015   Long-term use of high-risk medication 07/11/2015   Osteopenia 04/11/2015   Depression with anxiety 04/11/2015   Giant cell arteritis (HCC) 03/28/2015   Polymyalgia rheumatica 03/16/2015   Chronic narcotic use 03/16/2015   Anemia, iron deficiency 07/14/2013   Asthma, moderate persistent 10/15/2012   Hypertriglyceridemia 12/27/2008   HYPERTENSION, BENIGN 12/27/2008   PCP:  Wendolyn Jenkins Jansky, MD Pharmacy:   [facility] - [address], [place] - [address] [place] [address] Phone: [phone] Fax: [phone]  [facility] [place]  - [place], [place] - [address] [place] [address] Phone: [phone] Fax: [phone]   Social Drivers of Health (SDOH) Social History: SDOH  Screenings   Food Insecurity: No Food Insecurity (12/31/2023)  Housing: Low Risk  (12/31/2023)  Transportation Needs: No  Transportation Needs (12/31/2023)  Utilities: Not At Risk (12/31/2023)  Alcohol Screen: Low Risk  (08/26/2023)  Depression (PHQ2-9): Medium Risk (09/30/2023)  Financial Resource Strain: Low Risk  (08/26/2023)  Physical Activity: Insufficiently Active (08/26/2023)  Social Connections: Socially Isolated (12/31/2023)  Stress: No Stress Concern Present (08/26/2023)  Tobacco Use: Low Risk (02/03/2024)   Received from [facility]  Health Literacy: Adequate Health Literacy (08/26/2023)   SDOH Interventions:

## 2024-02-25 NOTE — ED Notes (Signed)
 Lab aware we need a urine culture added to pt.

## 2024-02-25 NOTE — ED Notes (Signed)
 EDP at bedside during triage

## 2024-02-25 NOTE — ED Triage Notes (Signed)
 Pt arrived [facility] from home due to a fall . Pt previously had a brace to right wrist. Right elbow skin tear covered in a dressing. Pt c/o right lower rib pain. Cbg 110 . Pt has bilateral hearing aides. Pt on 2lpm continuously.

## 2024-02-25 NOTE — Plan of Care (Signed)
°  Problem: Acute Rehab PT Goals(only PT should resolve) Goal: Pt Will Go Supine/Side To Sit Outcome: Progressing Flowsheets (Taken 02/25/2024 1556) Pt will go Supine/Side to Sit: with minimal assist Goal: Patient Will Transfer Sit To/From Stand Outcome: Progressing Flowsheets (Taken 02/25/2024 1556) Patient will transfer sit to/from stand: with minimal assist Goal: Pt Will Transfer Bed To Chair/Chair To Bed Outcome: Progressing Flowsheets (Taken 02/25/2024 1556) Pt will Transfer Bed to Chair/Chair to Bed: with min assist Goal: Pt Will Ambulate Outcome: Progressing Flowsheets (Taken 02/25/2024 1556) Pt will Ambulate:  25 feet  with minimal assist  with rolling walker   3:57 PM, 02/25/24 Lynwood Music, MPT Physical Therapist with [facility] [phone] office [phone] mobile phone

## 2024-02-25 NOTE — H&P (Addendum)
 History and Physical    Patient: [name] FMW:[id number] DOB: 01/01/1930 DOA: 02/25/2024 DOS: the patient was seen and examined on 02/25/2024 PCP: Wendolyn Jenkins Jansky, MD  Patient coming from: Home  Chief Complaint:  Chief Complaint  Patient presents with   Fall   HPI: [name] is a 89 year old female with a history of asthma, chronic respiratory failure on 2 L, dementia, presumed metastatic colon cancer, trans arteritis on prednisone , B12 deficiency, RLS, pulm nodule rheumatica presenting with wheezing shortness of breath and a fall. The patient's daughter at the bedside supplements the history. The patient is a difficult historian secondary to her dementia. The patient was recently admitted to the hospital from 12/30/2023 to 01/03/2024 after sustaining mechanical fall and right radial fracture.  She was managed nonoperatively.  During that hospitalization, she was noted to have transverse colon thickening with liver mass.  A biopsy was not pursued.  Family does not wish to pursue this any further given the patient's dementia and advanced age.  She was discharged home with home health physical therapy.  The patient did make some improvements and was able to bear weight at once again.  Unfortunately, she used her wheelchair as a walker on the day of admission and sustained a mechanical fall going down a ramp.  It was unclear whether the patient had syncope.  The patient's son heard the patient yell after the fall.  Daughter states that the patient has had more wheezing and some shortness of breath in the past week.  There is not been any complaints of fevers, chills, hemoptysis, nausea, vomiting, diarrhea abdominal pain, chest pain. In the ED, the patient was afebrile and hemodynamically stable with oxygen  saturation 98% on 2 L.  WBC 11.2, hemoglobin 10.9, platelets 175.  Sodium 142, potassium 4.4, bicarbonate 29, serum creatinine 0.82.  X-ray of the pelvis showed comminuted left pubic bone  fracture including the superior pubic ramus medially.  X-ray of the ribs shows a displaced right lateral rib fracture without pneumothorax.  There was a suggestion of possible left lower lobe opacity.  UA showed 11-20 WBC.  EKG showed sinus rhythm and IVCD. Review of Systems: As mentioned in the history of present illness. All other systems reviewed and are negative. Past Medical History:  Diagnosis Date   Abdominal bloating    Abnormal LFTs (liver function tests)    Ankle fracture, bimalleolar, closed, right, initial encounter    Anxiety disorder    Arthritis    Asthma    Bimalleolar ankle fracture, right, closed, initial encounter 01/06/2019   Depression    Diverticulosis    Edema    Fatty liver    Fibromyalgia    GERD (gastroesophageal reflux disease)    Giant cell arteritis (HCC)    No biopsy secondary to steroid use   Hx of adenomatous colonic polyps    Hyperlipidemia    Hypertension    IBS (irritable bowel syndrome)    Lumbar radiculitis    Osteoporosis 09/2017   T score -2.6 left femoral neck   Peptic ulcer disease    Skin cancer    shoulder, leg, right eye (? squamous by resection)   Thoracic radiculitis    Thyroid  nodule    removed   Urinary incontinence    Vertigo    Past Surgical History:  Procedure Laterality Date   ABDOMINAL HYSTERECTOMY     BSO   BACK SURGERY  2005   lower back   EYE SURGERY Right 2006   LIVER  BIOPSY     fatty liver   NASAL SEPTUM SURGERY     ORIF ANKLE FRACTURE Right 01/11/2019   Procedure: OPEN REDUCTION INTERNAL FIXATION (ORIF) RIGHT BIMALLEOLAR ANKLE FRACTURE;  Surgeon: Jerri Kay HERO, MD;  Location: [facility];  Service: Orthopedics;  Laterality: Right;   right knee surgery  2006   arthroscopy   right leg skin cancer surgery  2014   right shoulder skin cancer excision  2012   SKIN CANCER EXCISION     THYROIDECTOMY     Social History:  reports that she has never smoked. She has never used smokeless tobacco. She  reports that she does not drink alcohol and does not use drugs.  Allergies  Allergen Reactions   Oxycodone  Itching and Other (See Comments)    Hallucinations, too   Statins Other (See Comments)    Myalgia    Advair Diskus [Fluticasone -Salmeterol] Other (See Comments)    Mouth sores   Atorvastatin  Other (See Comments)    Severe headache   Sulfonamide Derivatives Rash    Family History  Problem Relation Age of Onset   Colon polyps Maternal Aunt    Diabetes Father    Heart disease Father    Heart disease Mother    Lung disease Mother    COPD Mother    Lung cancer Brother    Cancer Brother        lung   COPD Brother    Stroke Brother    Lung cancer Sister    COPD Sister    Kidney disease Other        niece-mat side   Arthritis Other    Diabetes Son    Arthritis Daughter    Colon cancer Neg Hx     Prior to Admission medications   Medication Sig Start Date End Date Taking? Authorizing Provider  acetaminophen  (TYLENOL ) 500 MG tablet Take 1 tablet (500 mg total) by mouth 3 (three) times daily. 01/03/24   Regalado, Belkys A, MD  albuterol  (PROVENTIL ) (2.5 MG/3ML) 0.083% nebulizer solution Take 3 mLs (2.5 mg total) by nebulization every 6 (six) hours as needed for wheezing or shortness of breath. 01/07/24   Wendolyn Jenkins Jansky, MD  albuterol  (VENTOLIN  HFA) 108 [phone] Base) MCG/ACT inhaler INHALE TWO (2) PUFFS BY MOUTH EVERY 4-6 HOURS AS NEEDED *NEW PRESCRIPTION REQUEST* 01/07/24   Wendolyn Jenkins Jansky, MD  ARIPiprazole  (ABILIFY ) 2 MG tablet TAKE 1 TABLET BY MOUTH EVERY DAY *NEW PRESCRIPTION REQUEST* 01/07/24   Wendolyn Jenkins Jansky, MD  ASPIRIN  LOW DOSE 81 MG tablet TAKE 1 TABLET BY MOUTH EVERY DAY *NEW PRESCRIPTION REQUEST* 01/07/24   Wendolyn Jenkins Jansky, MD  budesonide  (PULMICORT ) 1 MG/2ML nebulizer solution INHALE THE CONTENTS OF 1 VIAL VIA NEBULIZER TWICE DAILY *NEW PRESCRIPTION REQUEST* 01/07/24   Wendolyn Jenkins Jansky, MD  budesonide -formoterol  (SYMBICORT ) 160-4.5 MCG/ACT inhaler Inhale 2 puffs  into the lungs 2 (two) times daily. 12/02/23   Kassie Acquanetta Bradley, MD  buPROPion  (WELLBUTRIN  XL) 150 MG 24 hr tablet Take 1 tablet (150 mg total) by mouth in the morning. 01/07/24   Wendolyn Jenkins Jansky, MD  calcium -vitamin D  (OSCAL WITH D) 500-200 MG-UNIT tablet Take 1 tablet by mouth 3 (three) times daily. Patient not taking: Reported on 12/31/2023 01/11/19   Jerri Kay HERO, MD  Cholecalciferol (VITAMIN D ) 50 MCG (2000 UT) tablet TAKE 1 TABLET BY MOUTH EVERY DAY *NEW PRESCRIPTION REQUEST* 01/07/24   Wendolyn Jenkins Jansky, MD  cyanocobalamin  (VITAMIN B12) 1000 MCG/ML injection Inject 1  mL (1,000 mcg total) into the muscle every 30 (thirty) days. 01/07/24   Wendolyn Jenkins Jansky, MD  diazepam  (VALIUM ) 5 MG tablet Take 1 tablet (5 mg total) by mouth See admin instructions. Take 5 mg by mouth in the evening AS NEEDED for restlessness and an additional 5 mg once a day as needed for anxiety 01/13/24   Wendolyn Jenkins Jansky, MD  docusate sodium  (COLACE) 100 MG capsule Take 1 capsule (100 mg total) by mouth daily. 01/07/24   Wendolyn Jenkins Jansky, MD  estradiol (ESTRACE) 0.1 MG/GM vaginal cream Place 1 Applicatorful vaginally daily as needed (AS DIRECTED). 03/30/21   [provider]  guaiFENesin -dextromethorphan (ROBITUSSIN DM) 100-10 MG/5ML syrup Take 5 mLs by mouth every 4 (four) hours as needed for cough. 01/03/24   Regalado, Belkys A, MD  HYDROcodone -acetaminophen  (NORCO/VICODIN) 5-325 MG tablet Take 1-2 tablets by mouth every 6 (six) hours as needed. 12/30/23   Jerrol Agent, MD  HYDROcodone -acetaminophen  (NORCO/VICODIN) 5-325 MG tablet Take 1-2 tablets by mouth every 4 (four) hours as needed for severe pain (pain score 7-10). 01/03/24   Regalado, Belkys A, MD  lidocaine  (LIDODERM ) 5 % Place 1 patch onto the skin daily. Remove & Discard patch within 12 hours or as directed by MD 01/03/24   Regalado, Owen A, MD  memantine  (NAMENDA ) 5 MG tablet Take 1 tablet (5 mg total) by mouth in the morning and at bedtime. 01/07/24    Wendolyn Jenkins Jansky, MD  omega-3 acid ethyl esters (LOVAZA ) 1 g capsule Take 2 capsules (2 g total) by mouth 2 (two) times daily. 01/22/24   Wendolyn Jenkins Jansky, MD  omeprazole  (PRILOSEC) 40 MG capsule TAKE ONE (1) CAPSULE BY MOUTH TWICE DAILY *NEW PRESCRIPTION REQUEST* 01/07/24   Wendolyn Jenkins Jansky, MD  ondansetron  (ZOFRAN -ODT) 8 MG disintegrating tablet Take 1 tablet (8 mg total) by mouth every 8 (eight) hours as needed for nausea or vomiting. PLACE 1 TABLET ON TONGUE EVERY 8 HOURS AS NEEDED FOR NAUSEA OR VOMITING 01/13/24   Wendolyn Jenkins Jansky, MD  polyethylene glycol (MIRALAX  / GLYCOLAX ) 17 g packet Take 17 g by mouth daily. 01/13/24   Wendolyn Jenkins Jansky, MD  predniSONE  (DELTASONE ) 5 MG tablet Take 5 mg by mouth at bedtime.    [provider]  Probiotic CHEW Chew 1 each by mouth every other day. 01/07/24   Wendolyn Jenkins Jansky, MD  rOPINIRole  (REQUIP ) 1 MG tablet Take 1 tablet (1 mg total) by mouth as directed. TAKE 1 TABLET IN THE MORNING AND 2 TABLETS IN THE EVENING 01/22/24   Wendolyn Jenkins Jansky, MD  senna-docusate (SENOKOT-S) 8.6-50 MG tablet Take 1 tablet by mouth 2 (two) times daily. 01/03/24   Regalado, Belkys A, MD  sertraline  (ZOLOFT ) 50 MG tablet Take 1 tablet (50 mg total) by mouth in the morning. 01/07/24   Wendolyn Jenkins Jansky, MD  Syringe/Needle, Disp, (SYRINGE 3CC/25GX1) 25G X 1 3 ML MISC Use to give IM B12 injection 01/13/24   Wendolyn Jenkins Jansky, MD  SYSTANE 0.4-0.3 % SOLN Place 1 drop into both eyes 4 (four) times daily as needed (for dryness). 01/13/24   Wendolyn Jenkins Jansky, MD  Tiotropium Bromide  (SPIRIVA  RESPIMAT) 2.5 MCG/ACT AERS Inhale 2 Inhalations into the lungs daily at 12 noon. 01/22/24   Wendolyn Jenkins Jansky, MD  tocilizumab  4 mg/kg in sodium chloride  0.9 % Inject 280 mg into the vein every 28 (twenty-eight) days. 12/03/16   [provider]    Physical Exam: Vitals:   02/25/24 1045 02/25/24 1050  02/25/24 1100 02/25/24 1200  BP: (!) 185/70  (!) 158/64 (!) 155/56  Pulse: 80  84 74   Resp: (!) 26  (!) 24 19  Temp: 98 F (36.7 C)     TempSrc: Oral     SpO2: 97%  93% 98%  Weight:  77.3 kg    Height:  5' 7 (1.702 m)     GENERAL:  A&O x 2, NAD, well developed, cooperative, follows commands HEENT: [place]/AT, No thrush, No icterus, No oral ulcers Neck:  No neck mass, No meningismus, soft, supple CV: RRR, no S3, no S4, no rub, no JVD Lungs: Bilateral rales.  Bilateral expiratory wheeze. Abd: soft/NT +BS, nondistended Ext: No edema, no lymphangitis, no cyanosis, no rashes Neuro:  CN II-XII intact, strength 4/5 in RUE, RLE, strength 4/5 LUE, LLE; sensation intact bilateral; no dysmetria; babinski equivocal  Data Reviewed: Data reviewed above in the history  Assessment and Plan: Acute asthma exacerbation -Start DuoNebs - Start Pulmicort  - Start Brovana  - Start IV Solu-Medrol  - Check COVID - Viral respiratory panel  Left pubic bone fracture, closed - PT evaluation - Continue nonoperative management - Judicious opioids  Major neurocognitive disorder - Continue Namenda   Depression - Continue sertraline  and Abilify   Pyuria - Concerned about underlying UTI - Start ceftriaxone  - Follow urine culture  LLL opacity - Start ceftriaxone  and doxycycline  - Check PCT - Check COVID-19/RSV/flu  Polymyalgia rheumatica/giant cell arteritis - She is chronically on prednisone  5 mg daily - Outpatient follow-up with rheumatology  Right distal radius fracture - Outpatient follow-up with Ortho care - Remain in splint    Advance Care Planning: FULL  Consults: none  Family Communication: daughter 12/10  Severity of Illness: The appropriate patient status for this patient is OBSERVATION. Observation status is judged to be reasonable and necessary in order to provide the required intensity of service to ensure the patient's safety. The patient's presenting symptoms, physical exam findings, and initial radiographic and laboratory data in the context of their medical  condition is felt to place them at decreased risk for further clinical deterioration. Furthermore, it is anticipated that the patient will be medically stable for discharge from the hospital within 2 midnights of admission.   Author: Alm Schneider, MD 02/25/2024 1:36 PM  For on call review [email or web address].

## 2024-02-25 NOTE — ED Provider Notes (Signed)
 [place] EMERGENCY DEPARTMENT AT [facility] Provider Note   CSN: [id number] Arrival date & time: 02/25/24  1026     Patient presents with: [name]   [name] is a 89 y.o. female.    Fall   89 year old female, she comes from home, she is living with her son who has stage IV cancer, her daughter lives across the street.  Patient does have a known history of dementia, she presents after having a witnessed fall, the patient is confused at baseline, she usually is in a wheelchair but today she tried to get up and walk and use the wheelchair as a walker.  According to the daughter it is not unusual for her to have some confusion like this.  She had a fall when she went from 1 room to another down a ramp losing her balance.  She complains only of rib tenderness on the right side.  No other complaints    Prior to Admission medications   Medication Sig Start Date End Date Taking? Authorizing Provider  acetaminophen  (TYLENOL ) 500 MG tablet Take 1 tablet (500 mg total) by mouth 3 (three) times daily. 01/03/24   Regalado, Belkys A, MD  albuterol  (PROVENTIL ) (2.5 MG/3ML) 0.083% nebulizer solution Take 3 mLs (2.5 mg total) by nebulization every 6 (six) hours as needed for wheezing or shortness of breath. 01/07/24   Wendolyn Jenkins Jansky, MD  albuterol  (VENTOLIN  HFA) 108 [phone] Base) MCG/ACT inhaler INHALE TWO (2) PUFFS BY MOUTH EVERY 4-6 HOURS AS NEEDED *NEW PRESCRIPTION REQUEST* 01/07/24   Wendolyn Jenkins Jansky, MD  ARIPiprazole  (ABILIFY ) 2 MG tablet TAKE 1 TABLET BY MOUTH EVERY DAY *NEW PRESCRIPTION REQUEST* 01/07/24   Wendolyn Jenkins Jansky, MD  ASPIRIN  LOW DOSE 81 MG tablet TAKE 1 TABLET BY MOUTH EVERY DAY *NEW PRESCRIPTION REQUEST* 01/07/24   Wendolyn Jenkins Jansky, MD  budesonide  (PULMICORT ) 1 MG/2ML nebulizer solution INHALE THE CONTENTS OF 1 VIAL VIA NEBULIZER TWICE DAILY *NEW PRESCRIPTION REQUEST* 01/07/24   Wendolyn Jenkins Jansky, MD  budesonide -formoterol  (SYMBICORT ) 160-4.5 MCG/ACT inhaler Inhale 2 puffs  into the lungs 2 (two) times daily. 12/02/23   Kassie Acquanetta Bradley, MD  buPROPion  (WELLBUTRIN  XL) 150 MG 24 hr tablet Take 1 tablet (150 mg total) by mouth in the morning. 01/07/24   Wendolyn Jenkins Jansky, MD  calcium -vitamin D  (OSCAL WITH D) 500-200 MG-UNIT tablet Take 1 tablet by mouth 3 (three) times daily. Patient not taking: Reported on 12/31/2023 01/11/19   Jerri Kay HERO, MD  Cholecalciferol (VITAMIN D ) 50 MCG (2000 UT) tablet TAKE 1 TABLET BY MOUTH EVERY DAY *NEW PRESCRIPTION REQUEST* 01/07/24   Wendolyn Jenkins Jansky, MD  cyanocobalamin  (VITAMIN B12) 1000 MCG/ML injection Inject 1 mL (1,000 mcg total) into the muscle every 30 (thirty) days. 01/07/24   Wendolyn Jenkins Jansky, MD  diazepam  (VALIUM ) 5 MG tablet Take 1 tablet (5 mg total) by mouth See admin instructions. Take 5 mg by mouth in the evening AS NEEDED for restlessness and an additional 5 mg once a day as needed for anxiety 01/13/24   Wendolyn Jenkins Jansky, MD  docusate sodium  (COLACE) 100 MG capsule Take 1 capsule (100 mg total) by mouth daily. 01/07/24   Wendolyn Jenkins Jansky, MD  estradiol (ESTRACE) 0.1 MG/GM vaginal cream Place 1 Applicatorful vaginally daily as needed (AS DIRECTED). 03/30/21   [provider]  guaiFENesin -dextromethorphan (ROBITUSSIN DM) 100-10 MG/5ML syrup Take 5 mLs by mouth every 4 (four) hours as needed for cough. 01/03/24   Regalado, Owen LABOR, MD  HYDROcodone -acetaminophen  (NORCO/VICODIN) 5-325 MG tablet Take 1-2 tablets by mouth every 6 (six) hours as needed. 12/30/23   Jerrol Agent, MD  HYDROcodone -acetaminophen  (NORCO/VICODIN) 5-325 MG tablet Take 1-2 tablets by mouth every 4 (four) hours as needed for severe pain (pain score 7-10). 01/03/24   Regalado, Belkys A, MD  lidocaine  (LIDODERM ) 5 % Place 1 patch onto the skin daily. Remove & Discard patch within 12 hours or as directed by MD 01/03/24   Regalado, Owen A, MD  memantine  (NAMENDA ) 5 MG tablet Take 1 tablet (5 mg total) by mouth in the morning and at bedtime. 01/07/24    Wendolyn Jenkins Jansky, MD  omega-3 acid ethyl esters (LOVAZA ) 1 g capsule Take 2 capsules (2 g total) by mouth 2 (two) times daily. 01/22/24   Wendolyn Jenkins Jansky, MD  omeprazole  (PRILOSEC) 40 MG capsule TAKE ONE (1) CAPSULE BY MOUTH TWICE DAILY *NEW PRESCRIPTION REQUEST* 01/07/24   Wendolyn Jenkins Jansky, MD  ondansetron  (ZOFRAN -ODT) 8 MG disintegrating tablet Take 1 tablet (8 mg total) by mouth every 8 (eight) hours as needed for nausea or vomiting. PLACE 1 TABLET ON TONGUE EVERY 8 HOURS AS NEEDED FOR NAUSEA OR VOMITING 01/13/24   Wendolyn Jenkins Jansky, MD  polyethylene glycol (MIRALAX  / GLYCOLAX ) 17 g packet Take 17 g by mouth daily. 01/13/24   Wendolyn Jenkins Jansky, MD  predniSONE  (DELTASONE ) 5 MG tablet Take 5 mg by mouth at bedtime.    [provider]  Probiotic CHEW Chew 1 each by mouth every other day. 01/07/24   Wendolyn Jenkins Jansky, MD  rOPINIRole  (REQUIP ) 1 MG tablet Take 1 tablet (1 mg total) by mouth as directed. TAKE 1 TABLET IN THE MORNING AND 2 TABLETS IN THE EVENING 01/22/24   Wendolyn Jenkins Jansky, MD  senna-docusate (SENOKOT-S) 8.6-50 MG tablet Take 1 tablet by mouth 2 (two) times daily. 01/03/24   Regalado, Belkys A, MD  sertraline  (ZOLOFT ) 50 MG tablet Take 1 tablet (50 mg total) by mouth in the morning. 01/07/24   Wendolyn Jenkins Jansky, MD  Syringe/Needle, Disp, (SYRINGE 3CC/25GX1) 25G X 1 3 ML MISC Use to give IM B12 injection 01/13/24   Wendolyn Jenkins Jansky, MD  SYSTANE 0.4-0.3 % SOLN Place 1 drop into both eyes 4 (four) times daily as needed (for dryness). 01/13/24   Wendolyn Jenkins Jansky, MD  Tiotropium Bromide  (SPIRIVA  RESPIMAT) 2.5 MCG/ACT AERS Inhale 2 Inhalations into the lungs daily at 12 noon. 01/22/24   Wendolyn Jenkins Jansky, MD  tocilizumab  4 mg/kg in sodium chloride  0.9 % Inject 280 mg into the vein every 28 (twenty-eight) days. 12/03/16   [provider]    Allergies: Oxycodone , Statins, Advair diskus [fluticasone -salmeterol], Atorvastatin , and Sulfonamide derivatives    Review of Systems  All  other systems reviewed and are negative.   Updated Vital Signs BP (!) 155/56   Pulse 74   Temp 98 F (36.7 C) (Oral)   Resp 19   Ht 1.702 m (5' 7)   Wt 77.3 kg   SpO2 98%   BMI 26.69 kg/m   Physical Exam Vitals and nursing note reviewed.  Constitutional:      General: She is not in acute distress.    Appearance: She is well-developed.  HENT:     Head: Normocephalic and atraumatic.     Mouth/Throat:     Pharynx: No oropharyngeal exudate.  Eyes:     General: No scleral icterus.       Right eye: No discharge.  Left eye: No discharge.     Conjunctiva/sclera: Conjunctivae normal.     Pupils: Pupils are equal, round, and reactive to light.  Neck:     Thyroid : No thyromegaly.     Vascular: No JVD.  Cardiovascular:     Rate and Rhythm: Normal rate and regular rhythm.     Heart sounds: Normal heart sounds. No murmur heard.    No friction rub. No gallop.  Pulmonary:     Effort: Pulmonary effort is normal. No respiratory distress.     Breath sounds: Normal breath sounds. No wheezing or rales.  Chest:     Chest wall: Tenderness present.  Abdominal:     General: Bowel sounds are normal. There is no distension.     Palpations: Abdomen is soft. There is no mass.     Tenderness: There is no abdominal tenderness.  Musculoskeletal:        General: No tenderness. Normal range of motion.     Cervical back: Normal range of motion and neck supple.     Right lower leg: No edema.     Left lower leg: No edema.  Lymphadenopathy:     Cervical: No cervical adenopathy.  Skin:    General: Skin is warm and dry.     Findings: No erythema or rash.  Neurological:     Mental Status: She is alert.     Coordination: Coordination normal.     Comments: Neurologic exam performed with the daughter in the room, the patient is able to move all 4 extremities with normal strength, she does have a Velcro immobilizer on her right distal forearm secondary to a prior radial fracture.  He is able to  follow commands, show me 2 fingers and the thumb, she is able to perform cranial nerves III through XII without difficulty, she can straight leg raise with normal strength, grips with normal strength and sensation is normal.  She does have some mild disorientation and confusion but the daughter says she is at her baseline  Psychiatric:        Behavior: Behavior normal.     (all labs ordered are listed, but only abnormal results are displayed) Labs Reviewed  URINALYSIS, ROUTINE W REFLEX MICROSCOPIC - Abnormal; Notable for the following components:      Result Value   APPearance HAZY (*)    Hgb urine dipstick SMALL (*)    Ketones, ur 5 (*)    Protein, ur 100 (*)    Leukocytes,Ua SMALL (*)    Bacteria, UA MANY (*)    All other components within normal limits  CBC - Abnormal; Notable for the following components:   WBC 11.2 (*)    RBC 3.53 (*)    Hemoglobin 10.9 (*)    HCT 35.5 (*)    MCV 100.6 (*)    All other components within normal limits  BASIC METABOLIC PANEL WITH GFR - Abnormal; Notable for the following components:   Glucose, Bld 103 (*)    All other components within normal limits  URINE CULTURE    EKG: EKG Interpretation Date/Time:  Wednesday February 25 2024 10:44:51 EST Ventricular Rate:  81 PR Interval:  176 QRS Duration:  140 QT Interval:  393 QTC Calculation: 457 R Axis:   98  Text Interpretation: Sinus or ectopic atrial rhythm Nonspecific intraventricular conduction delay Repol abnrm suggests ischemia, diffuse leads Confirmed by Cleotilde Rogue ([id number]) on 02/25/2024 10:48:39 AM  Radiology: [facility] Hip Unilat W or Wo Pelvis 2-3 Views Left  Result Date: 02/25/2024 CLINICAL DATA:  Left hip pain following a fall today. EXAM: DG HIP (WITH OR WITHOUT PELVIS) 2-3V LEFT COMPARISON:  Pelvis CT dated 12/30/2023. FINDINGS: Interval comminuted left pubic bone fracture including the medial aspect of the superior pubic ramus. No hip fracture or dislocation. Previously demonstrated  lumbosacral spine fixation hardware. IMPRESSION: Interval comminuted left pubic bone fracture without hip fracture or dislocation. Electronically Signed   By: Elspeth Bathe M.D.   On: 02/25/2024 12:07   DG Ribs Unilateral W/Chest Right Result Date: 02/25/2024 CLINICAL DATA:  Right lower anterior rib pain and tenderness following a fall. EXAM: RIGHT RIBS AND CHEST - 3+ VIEW COMPARISON:  Portable chest dated 12/30/2023. FINDINGS: Stable enlarged cardiac silhouette. Interval small amount of lung consolidation and possible small pleural effusion at the left lateral lung base. Interval mild atelectasis in the left mid and lower lung zones. Clear right lung. Stable mild prominence of the pulmonary vasculature. Diffuse osteopenia. Mildly displaced right lateral 10th rib fracture. The anterior ribs are not adequately visualized at the location of patient pain, marked with a metallic marker, due to underexposure of the overlying soft tissues. No pneumothorax seen. IMPRESSION: 1. Mildly displaced right lateral 10th rib fracture. 2. The anterior ribs are not adequately visualized at the location of patient pain due to underexposure of the overlying soft tissues. 3. No pneumothorax. 4. Interval small amount of lung consolidation and possible small pleural effusion at the left lateral lung base. 5. Interval mild atelectasis in the left mid and lower lung zones. 6. Stable cardiomegaly and mild pulmonary vascular congestion. Electronically Signed   By: Elspeth Bathe M.D.   On: 02/25/2024 12:03   XR Wrist Complete Right Result Date: 02/24/2024 X-rays of the right wrist demonstrate appropriate interval healing of the distal radius fracture seen on prior radiographs.  There is notable callus formation.  Residual ulnar positivity has resulted from the shortening of the distal radius as a result of the fracture.  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    Procedures   Medications Ordered in the ED  morphine  (PF) 4  MG/ML injection 2 mg (2 mg Intravenous Given 02/25/24 1249)                                    Medical Decision Making Amount and/or Complexity of Data Reviewed Labs: ordered. Radiology: ordered.  Risk Prescription drug management. Decision regarding hospitalization.    This patient presents to the ED for concern of trauma, fall, this involves an extensive number of treatment options, and is a complaint that carries with it a high risk of complications and morbidity.  The differential diagnosis includes UTI, the daughter states she has had some bad smelling urine in the last couple of days.  The patient has normal vital signs but does have a wide-complex QRS that she did not have 1 prior.  This could be related to electrolyte abnormalities, rule out rib fracture on the right, the patient has known metastatic colon cancer of which they have requested no treatment.   Co morbidities / Chronic conditions that complicate the patient evaluation  Metastatic cancer Dementia   Additional history obtained:  Additional history obtained from EMR External records from outside source obtained and reviewed including cancer diagnosis and oncology notes   Lab Tests:  I Ordered, and personally interpreted labs.  The pertinent results include: Urinary tract infection present   Imaging Studies  ordered:  I ordered imaging studies including x-ray of the right ribs I independently visualized and interpreted imaging which showed single rib fracture no pneumothorax, pelvic fracture I agree with the radiologist interpretation   Cardiac Monitoring: / EKG:  The patient was maintained on a cardiac monitor.  I personally viewed and interpreted the cardiac monitored which showed an underlying rhythm of: Sinus rhythm, wide QRS   Problem List / ED Course / Critical interventions / Medication management  Patient given pain medication I ordered medication including pain medicines Reevaluation of  the patient after these medicines showed that the patient proved, antibiotics given for UTI I have reviewed the patients home medicines and have made adjustments as needed   Consultations Obtained:  I requested consultation with the hospitalist Dr. Evonnie,  and discussed lab and imaging findings as well as pertinent plan - they recommend: Admission to the hospital   Social Determinants of Health:  Dementia   Test / Admission - Considered:  Admission      Final diagnoses:  Closed displaced fracture of pelvis, unspecified part of pelvis, initial encounter (HCC)  Acute cystitis without hematuria  Closed fracture of one rib of right side, initial encounter    ED Discharge Orders     None          Cleotilde Rogue, MD 02/25/24 1318

## 2024-02-26 DIAGNOSIS — S52501A Unspecified fracture of the lower end of right radius, initial encounter for closed fracture: Secondary | ICD-10-CM | POA: Diagnosis present

## 2024-02-26 DIAGNOSIS — M316 Other giant cell arteritis: Secondary | ICD-10-CM | POA: Diagnosis not present

## 2024-02-26 DIAGNOSIS — G9341 Metabolic encephalopathy: Secondary | ICD-10-CM | POA: Diagnosis present

## 2024-02-26 DIAGNOSIS — Z8249 Family history of ischemic heart disease and other diseases of the circulatory system: Secondary | ICD-10-CM | POA: Diagnosis not present

## 2024-02-26 DIAGNOSIS — Z7952 Long term (current) use of systemic steroids: Secondary | ICD-10-CM | POA: Diagnosis not present

## 2024-02-26 DIAGNOSIS — N3000 Acute cystitis without hematuria: Secondary | ICD-10-CM | POA: Diagnosis present

## 2024-02-26 DIAGNOSIS — S2231XA Fracture of one rib, right side, initial encounter for closed fracture: Secondary | ICD-10-CM | POA: Diagnosis present

## 2024-02-26 DIAGNOSIS — C799 Secondary malignant neoplasm of unspecified site: Secondary | ICD-10-CM | POA: Diagnosis present

## 2024-02-26 DIAGNOSIS — M797 Fibromyalgia: Secondary | ICD-10-CM | POA: Diagnosis present

## 2024-02-26 DIAGNOSIS — Z1152 Encounter for screening for COVID-19: Secondary | ICD-10-CM | POA: Diagnosis not present

## 2024-02-26 DIAGNOSIS — I10 Essential (primary) hypertension: Secondary | ICD-10-CM | POA: Diagnosis present

## 2024-02-26 DIAGNOSIS — M810 Age-related osteoporosis without current pathological fracture: Secondary | ICD-10-CM | POA: Diagnosis present

## 2024-02-26 DIAGNOSIS — E890 Postprocedural hypothyroidism: Secondary | ICD-10-CM | POA: Diagnosis present

## 2024-02-26 DIAGNOSIS — S32592A Other specified fracture of left pubis, initial encounter for closed fracture: Secondary | ICD-10-CM | POA: Diagnosis not present

## 2024-02-26 DIAGNOSIS — F0393 Unspecified dementia, unspecified severity, with mood disturbance: Secondary | ICD-10-CM | POA: Diagnosis present

## 2024-02-26 DIAGNOSIS — J441 Chronic obstructive pulmonary disease with (acute) exacerbation: Secondary | ICD-10-CM | POA: Diagnosis not present

## 2024-02-26 DIAGNOSIS — W010XXA Fall on same level from slipping, tripping and stumbling without subsequent striking against object, initial encounter: Secondary | ICD-10-CM | POA: Diagnosis present

## 2024-02-26 DIAGNOSIS — K760 Fatty (change of) liver, not elsewhere classified: Secondary | ICD-10-CM | POA: Diagnosis present

## 2024-02-26 DIAGNOSIS — S32502A Unspecified fracture of left pubis, initial encounter for closed fracture: Secondary | ICD-10-CM | POA: Diagnosis present

## 2024-02-26 DIAGNOSIS — J9611 Chronic respiratory failure with hypoxia: Secondary | ICD-10-CM | POA: Diagnosis present

## 2024-02-26 DIAGNOSIS — F0390 Unspecified dementia without behavioral disturbance: Secondary | ICD-10-CM | POA: Diagnosis not present

## 2024-02-26 DIAGNOSIS — M315 Giant cell arteritis with polymyalgia rheumatica: Secondary | ICD-10-CM | POA: Diagnosis present

## 2024-02-26 DIAGNOSIS — F32A Depression, unspecified: Secondary | ICD-10-CM | POA: Diagnosis present

## 2024-02-26 DIAGNOSIS — Z833 Family history of diabetes mellitus: Secondary | ICD-10-CM | POA: Diagnosis not present

## 2024-02-26 DIAGNOSIS — C189 Malignant neoplasm of colon, unspecified: Secondary | ICD-10-CM | POA: Diagnosis present

## 2024-02-26 DIAGNOSIS — E785 Hyperlipidemia, unspecified: Secondary | ICD-10-CM | POA: Diagnosis present

## 2024-02-26 DIAGNOSIS — G2581 Restless legs syndrome: Secondary | ICD-10-CM | POA: Diagnosis present

## 2024-02-26 DIAGNOSIS — J45901 Unspecified asthma with (acute) exacerbation: Secondary | ICD-10-CM | POA: Diagnosis present

## 2024-02-26 DIAGNOSIS — K219 Gastro-esophageal reflux disease without esophagitis: Secondary | ICD-10-CM | POA: Diagnosis present

## 2024-02-26 LAB — CBC
HCT: 34.9 % — ABNORMAL LOW (ref 36.0–46.0)
Hemoglobin: 11 g/dL — ABNORMAL LOW (ref 12.0–15.0)
MCH: 31.3 pg (ref 26.0–34.0)
MCHC: 31.5 g/dL (ref 30.0–36.0)
MCV: 99.1 fL (ref 80.0–100.0)
Platelets: 190 K/uL (ref 150–400)
RBC: 3.52 MIL/uL — ABNORMAL LOW (ref 3.87–5.11)
RDW: 14.9 % (ref 11.5–15.5)
WBC: 12.2 K/uL — ABNORMAL HIGH (ref 4.0–10.5)
nRBC: 0 % (ref 0.0–0.2)

## 2024-02-26 LAB — BASIC METABOLIC PANEL WITH GFR
Anion gap: 11 (ref 5–15)
BUN: 18 mg/dL (ref 8–23)
CO2: 28 mmol/L (ref 22–32)
Calcium: 9.2 mg/dL (ref 8.9–10.3)
Chloride: 101 mmol/L (ref 98–111)
Creatinine, Ser: 0.9 mg/dL (ref 0.44–1.00)
GFR, Estimated: 59 mL/min — ABNORMAL LOW (ref >60)
Glucose, Bld: 145 mg/dL — ABNORMAL HIGH (ref 70–99)
Potassium: 4.6 mmol/L (ref 3.5–5.1)
Sodium: 140 mmol/L (ref 135–145)

## 2024-02-26 LAB — MAGNESIUM: Magnesium: 2.3 mg/dL (ref 1.7–2.4)

## 2024-02-26 NOTE — Progress Notes (Signed)
 PROGRESS NOTE  [name] FMW:[id number] DOB: 07-09-1929 DOA: 02/25/2024 PCP: Wendolyn Jenkins Jansky, MD  Brief History:   89 year old female with a history of asthma, chronic respiratory failure on 2 L, dementia, presumed metastatic colon cancer, trans arteritis on prednisone , B12 deficiency, RLS, pulm nodule rheumatica presenting with wheezing shortness of breath and a fall. The patient's daughter at the bedside supplements the history. The patient is a difficult historian secondary to her dementia. The patient was recently admitted to the hospital from 12/30/2023 to 01/03/2024 after sustaining mechanical fall and right radial fracture.  She was managed nonoperatively.  During that hospitalization, she was noted to have transverse colon thickening with liver mass.  A biopsy was not pursued.  Family does not wish to pursue this any further given the patient's dementia and advanced age.  She was discharged home with home health physical therapy.  The patient did make some improvements and was able to bear weight at once again.  Unfortunately, she used her wheelchair as a walker on the day of admission and sustained a mechanical fall going down a ramp.  It was unclear whether the patient had syncope.  The patient's son heard the patient yell after the fall.  Daughter states that the patient has had more wheezing and some shortness of breath in the past week.  There is not been any complaints of fevers, chills, hemoptysis, nausea, vomiting, diarrhea abdominal pain, chest pain. In the ED, the patient was afebrile and hemodynamically stable with oxygen  saturation 98% on 2 L.  WBC 11.2, hemoglobin 10.9, platelets 175.  Sodium 142, potassium 4.4, bicarbonate 29, serum creatinine 0.82.  X-ray of the pelvis showed comminuted left pubic bone fracture including the superior pubic ramus medially.  X-ray of the ribs shows a displaced right lateral rib fracture without pneumothorax.  There was a suggestion of  possible left lower lobe opacity.  UA showed 11-20 WBC.  EKG showed sinus rhythm and IVCD.   Assessment/Plan: Acute asthma exacerbation -Started DuoNebs - Started Pulmicort  - Started Brovana  - Continue IV Solu-Medrol  - Check COVID--neg - Viral respiratory panel--neg   Left pubic bone fracture, closed - PT evaluation>>SNF - family wishes to take patient home with HHPT/OT - Continue nonoperative management - Judicious opioids  Acute metabolic Encephalopathy - due to UTI and asthma exacerbation -12/11--improved, back to baseline   Major neurocognitive disorder - Continue Namenda    Depression - Continue sertraline  and Abilify    UTI--GNR - continue ceftriaxone  - Follow urine culture   LLL opacity - Started ceftriaxone  and doxycycline  - Check PCT--0.14 - Check COVID-19/RSV/flu--neg   Polymyalgia rheumatica/giant cell arteritis - She is chronically on prednisone  5 mg daily - Outpatient follow-up with rheumatology - now on IV steroids inpatient   Right distal radius fracture - Outpatient follow-up with [facility] - Remain in splint         Family Communication:   DTR at bedside 12/11  Consultants:  none  Code Status:  FULL /  DVT Prophylaxis:  [place] Heparin     Procedures: As Listed in Progress Note Above  Antibiotics: Ceftriaxone  12/10>> Doxy 12/10>>     Subjective: Patient denies fevers, chills, headache, chest pain, dyspnea, nausea, vomiting, diarrhea, abdominal pain, dysuria, hematuria, hematochezia, and melena.   Objective: Vitals:   02/25/24 2150 02/26/24 0558 02/26/24 0724 02/26/24 1421  BP: (!) 154/63 (!) 169/71  (!) 144/58  Pulse: 81 85  84  Resp:    20  Temp:  98.3 F (36.8 C) 98.5 F (36.9 C)  98 F (36.7 C)  TempSrc: Oral Oral  Oral  SpO2: 100% 99% 98% 100%  Weight:      Height:        Intake/Output Summary (Last 24 hours) at 02/26/2024 1731 Last data filed at 02/26/2024 1223 Gross per 24 hour  Intake 340 ml  Output 200  ml  Net 140 ml   Weight change:  Exam:  General:  Pt is alert, follows commands appropriately, not in acute distress HEENT: No icterus, No thrush, No neck mass, [place]/AT Cardiovascular: RRR, S1/S2, no rubs, no gallops Respiratory: bibasilar rales.  Minimal bibasilar wheeze Abdomen: Soft/+BS, non tender, non distended, no guarding Extremities: No edema, No lymphangitis, No petechiae, No rashes, no synovitis   Data Reviewed: I have personally reviewed following labs and imaging studies Basic Metabolic Panel: Recent Labs  Lab 02/25/24 1142 02/26/24 0447  NA 142 140  K 4.4 4.6  CL 102 101  CO2 29 28  GLUCOSE 103* 145*  BUN 15 18  CREATININE 0.82 0.90  CALCIUM  9.1 9.2  MG  --  2.3   Liver Function Tests: No results for input(s): AST, ALT, ALKPHOS, BILITOT, PROT, ALBUMIN in the last 168 hours. No results for input(s): LIPASE, AMYLASE in the last 168 hours. No results for input(s): AMMONIA in the last 168 hours. Coagulation Profile: No results for input(s): INR, PROTIME in the last 168 hours. CBC: Recent Labs  Lab 02/25/24 1142 02/26/24 0447  WBC 11.2* 12.2*  HGB 10.9* 11.0*  HCT 35.5* 34.9*  MCV 100.6* 99.1  PLT 175 190   Cardiac Enzymes: No results for input(s): CKTOTAL, CKMB, CKMBINDEX, TROPONINI in the last 168 hours. BNP: Invalid input(s): POCBNP CBG: No results for input(s): GLUCAP in the last 168 hours. HbA1C: No results for input(s): HGBA1C in the last 72 hours. Urine analysis:    Component Value Date/Time   COLORURINE YELLOW 02/25/2024 1111   APPEARANCEUR HAZY (A) 02/25/2024 1111   LABSPEC 1.015 02/25/2024 1111   PHURINE 6.0 02/25/2024 1111   GLUCOSEU NEGATIVE 02/25/2024 1111   GLUCOSEU NEGATIVE 01/11/2008 1526   HGBUR SMALL (A) 02/25/2024 1111   BILIRUBINUR NEGATIVE 02/25/2024 1111   BILIRUBINUR neg 08/29/2021 1301   KETONESUR 5 (A) 02/25/2024 1111   PROTEINUR 100 (A) 02/25/2024 1111   UROBILINOGEN 1.0  08/29/2021 1301   UROBILINOGEN 0.2 mg/dL 89/73/7990 8473   NITRITE NEGATIVE 02/25/2024 1111   LEUKOCYTESUR SMALL (A) 02/25/2024 1111   Sepsis Labs: @LABRCNTIP (procalcitonin:4,lacticidven:4) ) Recent Results (from the past 240 hours)  Urine Culture     Status: Abnormal (Preliminary result)   Collection Time: 02/25/24 11:11 AM   Specimen: Urine, Clean Catch  Result Value Ref Range Status   Specimen Description   Final    URINE, CLEAN CATCH Performed at [facility], [address], [place], [place] [address]    Special Requests   Final    NONE Performed at [facility], [address], [place], [place] [address]    Culture >=100,000 COLONIES/mL GRAM NEGATIVE RODS (A)  Final   Report Status PENDING  Incomplete  Resp panel by RT-PCR (RSV, Flu A&B, Covid) Anterior Nasal Swab     Status: None   Collection Time: 02/25/24  1:51 PM   Specimen: Anterior Nasal Swab  Result Value Ref Range Status   SARS Coronavirus 2 by RT PCR NEGATIVE NEGATIVE Final    Comment: (NOTE) SARS-CoV-2 target nucleic acids are NOT DETECTED.  The SARS-CoV-2 RNA is  generally detectable in upper respiratory specimens during the acute phase of infection. The lowest concentration of SARS-CoV-2 viral copies this assay can detect is 138 copies/mL. A negative result does not preclude SARS-Cov-2 infection and should not be used as the sole basis for treatment or other patient management decisions. A negative result may occur with  improper specimen collection/handling, submission of specimen other than nasopharyngeal swab, presence of viral mutation(s) within the areas targeted by this assay, and inadequate number of viral copies(<138 copies/mL). A negative result must be combined with clinical observations, patient history, and epidemiological information. The expected result is Negative.  Fact Sheet for Patients:  bloggercourse.com  Fact Sheet for Healthcare Providers:   seriousbroker.it  This test is no t yet approved or cleared by the [place]  FDA and  has been authorized for detection and/or diagnosis of SARS-CoV-2 by FDA under an Emergency Use Authorization (EUA). This EUA will remain  in effect (meaning this test can be used) for the duration of the COVID-19 declaration under Section 564(b)(1) of the Act, 21 U.S.C.section 360bbb-3(b)(1), unless the authorization is terminated  or revoked sooner.       Influenza A by PCR NEGATIVE NEGATIVE Final   Influenza B by PCR NEGATIVE NEGATIVE Final    Comment: (NOTE) The Xpert Xpress SARS-CoV-2/FLU/RSV plus assay is intended as an aid in the diagnosis of influenza from Nasopharyngeal swab specimens and should not be used as a sole basis for treatment. Nasal washings and aspirates are unacceptable for Xpert Xpress SARS-CoV-2/FLU/RSV testing.  Fact Sheet for Patients: bloggercourse.com  Fact Sheet for Healthcare Providers: seriousbroker.it  This test is not yet approved or cleared by the [place]  FDA and has been authorized for detection and/or diagnosis of SARS-CoV-2 by FDA under an Emergency Use Authorization (EUA). This EUA will remain in effect (meaning this test can be used) for the duration of the COVID-19 declaration under Section 564(b)(1) of the Act, 21 U.S.C. section 360bbb-3(b)(1), unless the authorization is terminated or revoked.     Resp Syncytial Virus by PCR NEGATIVE NEGATIVE Final    Comment: (NOTE) Fact Sheet for Patients: bloggercourse.com  Fact Sheet for Healthcare Providers: seriousbroker.it  This test is not yet approved or cleared by the [place]  FDA and has been authorized for detection and/or diagnosis of SARS-CoV-2 by FDA under an Emergency Use Authorization (EUA). This EUA will remain in effect (meaning this test can be used) for  the duration of the COVID-19 declaration under Section 564(b)(1) of the Act, 21 U.S.C. section 360bbb-3(b)(1), unless the authorization is terminated or revoked.  Performed at [facility], [address], [place], [place] [address]   Respiratory (~20 pathogens) panel by PCR     Status: None   Collection Time: 02/25/24  2:25 PM   Specimen: Nasopharyngeal Swab; Respiratory  Result Value Ref Range Status   Adenovirus NOT DETECTED NOT DETECTED Final   Coronavirus 229E NOT DETECTED NOT DETECTED Final    Comment: (NOTE) The Coronavirus on the Respiratory Panel, DOES NOT test for the novel  Coronavirus (2019 nCoV)    Coronavirus HKU1 NOT DETECTED NOT DETECTED Final   Coronavirus NL63 NOT DETECTED NOT DETECTED Final   Coronavirus OC43 NOT DETECTED NOT DETECTED Final   Metapneumovirus NOT DETECTED NOT DETECTED Final   Rhinovirus / Enterovirus NOT DETECTED NOT DETECTED Final   Influenza A NOT DETECTED NOT DETECTED Final   Influenza B NOT DETECTED NOT DETECTED Final   Parainfluenza Virus 1 NOT DETECTED NOT DETECTED Final  Parainfluenza Virus 2 NOT DETECTED NOT DETECTED Final   Parainfluenza Virus 3 NOT DETECTED NOT DETECTED Final   Parainfluenza Virus 4 NOT DETECTED NOT DETECTED Final   Respiratory Syncytial Virus NOT DETECTED NOT DETECTED Final   Bordetella pertussis NOT DETECTED NOT DETECTED Final   Bordetella Parapertussis NOT DETECTED NOT DETECTED Final   Chlamydophila pneumoniae NOT DETECTED NOT DETECTED Final   Mycoplasma pneumoniae NOT DETECTED NOT DETECTED Final    Comment: Performed at [facility], [address] [place], [place] [address]     Scheduled Meds:  arformoterol   15 mcg Nebulization BID   ARIPiprazole   2 mg Oral Daily   aspirin  EC  81 mg Oral Daily   budesonide  (PULMICORT ) nebulizer solution  0.5 mg Nebulization BID   buPROPion   150 mg Oral q AM   doxycycline   100 mg Oral Q12H   heparin   5,000 Units Subcutaneous Q8H   ipratropium-albuterol   3 mL  Nebulization BID   memantine   5 mg Oral BID   methylPREDNISolone  (SOLU-MEDROL ) injection  60 mg Intravenous Q12H   omega-3 acid ethyl esters  2 capsule Oral BID   pantoprazole   40 mg Oral Daily   polyethylene glycol  17 g Oral Daily   rOPINIRole   1 mg Oral Daily   And   rOPINIRole   2 mg Oral QHS   sertraline   50 mg Oral q AM   Continuous Infusions:  cefTRIAXone  (ROCEPHIN )  IV 1 g (02/26/24 1707)    Procedures/Studies: DG Hip Unilat W or Wo Pelvis 2-3 Views Left Result Date: 02/25/2024 CLINICAL DATA:  Left hip pain following a fall today. EXAM: DG HIP (WITH OR WITHOUT PELVIS) 2-3V LEFT COMPARISON:  Pelvis CT dated 12/30/2023. FINDINGS: Interval comminuted left pubic bone fracture including the medial aspect of the superior pubic ramus. No hip fracture or dislocation. Previously demonstrated lumbosacral spine fixation hardware. IMPRESSION: Interval comminuted left pubic bone fracture without hip fracture or dislocation. Electronically Signed   By: Elspeth Bathe M.D.   On: 02/25/2024 12:07   DG Ribs Unilateral W/Chest Right Result Date: 02/25/2024 CLINICAL DATA:  Right lower anterior rib pain and tenderness following a fall. EXAM: RIGHT RIBS AND CHEST - 3+ VIEW COMPARISON:  Portable chest dated 12/30/2023. FINDINGS: Stable enlarged cardiac silhouette. Interval small amount of lung consolidation and possible small pleural effusion at the left lateral lung base. Interval mild atelectasis in the left mid and lower lung zones. Clear right lung. Stable mild prominence of the pulmonary vasculature. Diffuse osteopenia. Mildly displaced right lateral 10th rib fracture. The anterior ribs are not adequately visualized at the location of patient pain, marked with a metallic marker, due to underexposure of the overlying soft tissues. No pneumothorax seen. IMPRESSION: 1. Mildly displaced right lateral 10th rib fracture. 2. The anterior ribs are not adequately visualized at the location of patient pain due to  underexposure of the overlying soft tissues. 3. No pneumothorax. 4. Interval small amount of lung consolidation and possible small pleural effusion at the left lateral lung base. 5. Interval mild atelectasis in the left mid and lower lung zones. 6. Stable cardiomegaly and mild pulmonary vascular congestion. Electronically Signed   By: Elspeth Bathe M.D.   On: 02/25/2024 12:03   XR Wrist Complete Right Result Date: 02/24/2024 X-rays of the right wrist demonstrate appropriate interval healing of the distal radius fracture seen on prior radiographs.  There is notable callus formation.  Residual ulnar positivity has resulted from the shortening of the distal radius as  a result of the fracture.  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   Alm Schneider, DO  [facility]  If 7PM-7AM, please contact night-coverage [email or web address] Password [email or web address] 02/26/2024, 5:31 PM   LOS: 0 days

## 2024-02-26 NOTE — Care Management Obs Status (Signed)
 MEDICARE OBSERVATION STATUS NOTIFICATION   Patient Details  Name: [name] MRN: [id number] Date of Birth: [birth year]   Medicare Observation Status Notification Given:  Yes    Duwaine LITTIE Ada 02/26/2024, 12:02 PM

## 2024-02-26 NOTE — Hospital Course (Signed)
 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 The patient's daughter at the bedside supplements the history. The patient is a difficult historian secondary to her dementia. The patient was recently admitted to the hospital from 12/30/2023 to 01/03/2024 after sustaining mechanical fall and right radial fracture.  She was managed nonoperatively.  During that hospitalization, she was noted to have transverse colon thickening with liver mass.  A biopsy was not pursued.  Family does not wish to pursue this any further given the patient's dementia and advanced age.  She was discharged home with home health physical therapy.  The patient did make some improvements and was able to bear weight at once again.  Unfortunately, she used her wheelchair as a walker on the day of admission and sustained a mechanical fall going down a ramp.  It was unclear whether the patient had syncope.  The patient's son heard the patient yell after the fall.  Daughter states that the patient has had more wheezing and some shortness of breath in the past week.  There is not been any complaints of fevers, chills, hemoptysis, nausea, vomiting, diarrhea abdominal pain, chest pain. In the ED, the patient was afebrile and hemodynamically stable with oxygen  saturation 98% on 2 L.  WBC 11.2, hemoglobin 10.9, platelets 175.  Sodium 142, potassium 4.4, bicarbonate 29, serum creatinine 0.82.  X-ray of the pelvis showed comminuted left pubic bone fracture including the superior pubic ramus medially.  X-ray of the ribs shows a displaced right lateral rib fracture without pneumothorax.  There was a suggestion of possible left lower lobe opacity.  UA showed 11-20 WBC.  EKG showed sinus rhythm and IVCD.

## 2024-02-27 DIAGNOSIS — J441 Chronic obstructive pulmonary disease with (acute) exacerbation: Secondary | ICD-10-CM | POA: Diagnosis not present

## 2024-02-27 DIAGNOSIS — N3000 Acute cystitis without hematuria: Secondary | ICD-10-CM | POA: Diagnosis not present

## 2024-02-27 DIAGNOSIS — S32592A Other specified fracture of left pubis, initial encounter for closed fracture: Secondary | ICD-10-CM | POA: Diagnosis not present

## 2024-02-27 MED ORDER — LACTATED RINGERS IV BOLUS
250.0000 mL | Freq: Once | INTRAVENOUS | Status: AC
  Administered 2024-02-27: 250 mL via INTRAVENOUS

## 2024-02-27 NOTE — Plan of Care (Signed)

## 2024-02-27 NOTE — Progress Notes (Addendum)
 PROGRESS NOTE  [name] FMW:[id number] DOB: Dec 09, 1929 DOA: 02/25/2024 PCP: Wendolyn Jenkins Jansky, MD  Brief History:   89 year old female with a history of asthma, chronic respiratory failure on 2 L, dementia, presumed metastatic colon cancer, trans arteritis on prednisone , B12 deficiency, RLS, pulm nodule rheumatica presenting with wheezing shortness of breath and a fall. The patient's daughter at the bedside supplements the history. The patient is a difficult historian secondary to her dementia. The patient was recently admitted to the hospital from 12/30/2023 to 01/03/2024 after sustaining mechanical fall and right radial fracture.  She was managed nonoperatively.  During that hospitalization, she was noted to have transverse colon thickening with liver mass.  A biopsy was not pursued.  Family does not wish to pursue this any further given the patient's dementia and advanced age.  She was discharged home with home health physical therapy.  The patient did make some improvements and was able to bear weight at once again.  Unfortunately, she used her wheelchair as a walker on the day of admission and sustained a mechanical fall going down a ramp.  It was unclear whether the patient had syncope.  The patient's son heard the patient yell after the fall.  Daughter states that the patient has had more wheezing and some shortness of breath in the past week.  There is not been any complaints of fevers, chills, hemoptysis, nausea, vomiting, diarrhea abdominal pain, chest pain. In the ED, the patient was afebrile and hemodynamically stable with oxygen  saturation 98% on 2 L.  WBC 11.2, hemoglobin 10.9, platelets 175.  Sodium 142, potassium 4.4, bicarbonate 29, serum creatinine 0.82.  X-ray of the pelvis showed comminuted left pubic bone fracture including the superior pubic ramus medially.  X-ray of the ribs shows a displaced right lateral rib fracture without pneumothorax.  There was a suggestion of  possible left lower lobe opacity.  UA showed 11-20 WBC.  EKG showed sinus rhythm and IVCD.   Assessment/Plan: Acute asthma exacerbation -Started DuoNebs - Started Pulmicort  - Started Brovana  - Continue IV Solu-Medrol  - Check COVID--neg - Viral respiratory panel--neg   Left pubic bone fracture, closed - PT evaluation>>SNF - family wishes to take patient home with HHPT/OT - Continue nonoperative management - Judicious opioids   Acute metabolic Encephalopathy - daughter noted pt less interactive and more confusion on day of admission - due to UTI and asthma exacerbation -12/11--improved, back to baseline   Major neurocognitive disorder - Continue Namenda    Depression - Continue sertraline  and Abilify    UTI--E cloacae - continue ceftriaxone  - Follow urine culture   LLL opacity - Started ceftriaxone  and doxycycline  - Check PCT--0.14 - Check COVID-19/RSV/flu--neg   Polymyalgia rheumatica/giant cell arteritis - She is chronically on prednisone  5 mg daily - Outpatient follow-up with rheumatology - now on IV steroids inpatient   Right distal radius fracture - Outpatient follow-up with [facility] - Remain in splint                 Family Communication:   DTR at bedside 12/12   Consultants:  none   Code Status:  FULL    DVT Prophylaxis:  Brown Heparin       Procedures: As Listed in Progress Note Above   Antibiotics: Ceftriaxone  12/10>> Doxy 12/10>>         Subjective: Patient denies fevers, chills, headache, chest pain, dyspnea, nausea, vomiting, diarrhea, abdominal pain   Objective: Vitals:   02/26/24 1950 02/27/24  0410 02/27/24 0811 02/27/24 1424  BP:  (!) 134/50  (!) 152/58  Pulse:  62  69  Resp:  19  20  Temp:  97.7 F (36.5 C)  98.8 F (37.1 C)  TempSrc:  Axillary  Oral  SpO2: 97% 98% 98% 99%  Weight:      Height:        Intake/Output Summary (Last 24 hours) at 02/27/2024 1625 Last data filed at 02/27/2024 1019 Gross per 24  hour  Intake 654.58 ml  Output 250 ml  Net 404.58 ml   Weight change:  Exam:  General:  Pt is alert, follows commands appropriately, not in acute distress HEENT: No icterus, No thrush, No neck mass, [place]/AT Cardiovascular: RRR, S1/S2, no rubs, no gallops Respiratory: bibasilar rales.  Min basilar wheeze Abdomen: Soft/+BS, non tender, non distended, no guarding Extremities: No edema, No lymphangitis, No petechiae, No rashes, no synovitis   Data Reviewed: I have personally reviewed following labs and imaging studies Basic Metabolic Panel: Recent Labs  Lab 02/25/24 1142 02/26/24 0447  NA 142 140  K 4.4 4.6  CL 102 101  CO2 29 28  GLUCOSE 103* 145*  BUN 15 18  CREATININE 0.82 0.90  CALCIUM  9.1 9.2  MG  --  2.3   Liver Function Tests: No results for input(s): AST, ALT, ALKPHOS, BILITOT, PROT, ALBUMIN in the last 168 hours. No results for input(s): LIPASE, AMYLASE in the last 168 hours. No results for input(s): AMMONIA in the last 168 hours. Coagulation Profile: No results for input(s): INR, PROTIME in the last 168 hours. CBC: Recent Labs  Lab 02/25/24 1142 02/26/24 0447  WBC 11.2* 12.2*  HGB 10.9* 11.0*  HCT 35.5* 34.9*  MCV 100.6* 99.1  PLT 175 190   Cardiac Enzymes: No results for input(s): CKTOTAL, CKMB, CKMBINDEX, TROPONINI in the last 168 hours. BNP: Invalid input(s): POCBNP CBG: No results for input(s): GLUCAP in the last 168 hours. HbA1C: No results for input(s): HGBA1C in the last 72 hours. Urine analysis:    Component Value Date/Time   COLORURINE YELLOW 02/25/2024 1111   APPEARANCEUR HAZY (A) 02/25/2024 1111   LABSPEC 1.015 02/25/2024 1111   PHURINE 6.0 02/25/2024 1111   GLUCOSEU NEGATIVE 02/25/2024 1111   GLUCOSEU NEGATIVE 01/11/2008 1526   HGBUR SMALL (A) 02/25/2024 1111   BILIRUBINUR NEGATIVE 02/25/2024 1111   BILIRUBINUR neg 08/29/2021 1301   KETONESUR 5 (A) 02/25/2024 1111   PROTEINUR 100 (A) 02/25/2024  1111   UROBILINOGEN 1.0 08/29/2021 1301   UROBILINOGEN 0.2 mg/dL 89/73/7990 8473   NITRITE NEGATIVE 02/25/2024 1111   LEUKOCYTESUR SMALL (A) 02/25/2024 1111   Sepsis Labs: @LABRCNTIP (procalcitonin:4,lacticidven:4) ) Recent Results (from the past 240 hours)  Urine Culture     Status: Abnormal (Preliminary result)   Collection Time: 02/25/24 11:11 AM   Specimen: Urine, Clean Catch  Result Value Ref Range Status   Specimen Description   Final    URINE, CLEAN CATCH Performed at [facility], [address], [place], [place] [address]    Special Requests   Final    NONE Performed at [facility], [address], [place], [place] [address]    Culture (A)  Final    >=100,000 COLONIES/mL ENTEROBACTER CLOACAE CONFIRMATION OF SUSCEPTIBILITIES IN PROGRESS Performed at [facility], [address] [place], [place] [address]    Report Status PENDING  Incomplete  Resp panel by RT-PCR (RSV, Flu A&B, Covid) Anterior Nasal Swab     Status: None   Collection  Time: 02/25/24  1:51 PM   Specimen: Anterior Nasal Swab  Result Value Ref Range Status   SARS Coronavirus 2 by RT PCR NEGATIVE NEGATIVE Final    Comment: (NOTE) SARS-CoV-2 target nucleic acids are NOT DETECTED.  The SARS-CoV-2 RNA is generally detectable in upper respiratory specimens during the acute phase of infection. The lowest concentration of SARS-CoV-2 viral copies this assay can detect is 138 copies/mL. A negative result does not preclude SARS-Cov-2 infection and should not be used as the sole basis for treatment or other patient management decisions. A negative result may occur with  improper specimen collection/handling, submission of specimen other than nasopharyngeal swab, presence of viral mutation(s) within the areas targeted by this assay, and inadequate number of viral copies(<138 copies/mL). A negative result must be combined with clinical observations, patient history, and epidemiological information. The  expected result is Negative.  Fact Sheet for Patients:  bloggercourse.com  Fact Sheet for Healthcare Providers:  seriousbroker.it  This test is no t yet approved or cleared by the [place]  FDA and  has been authorized for detection and/or diagnosis of SARS-CoV-2 by FDA under an Emergency Use Authorization (EUA). This EUA will remain  in effect (meaning this test can be used) for the duration of the COVID-19 declaration under Section 564(b)(1) of the Act, 21 U.S.C.section 360bbb-3(b)(1), unless the authorization is terminated  or revoked sooner.       Influenza A by PCR NEGATIVE NEGATIVE Final   Influenza B by PCR NEGATIVE NEGATIVE Final    Comment: (NOTE) The Xpert Xpress SARS-CoV-2/FLU/RSV plus assay is intended as an aid in the diagnosis of influenza from Nasopharyngeal swab specimens and should not be used as a sole basis for treatment. Nasal washings and aspirates are unacceptable for Xpert Xpress SARS-CoV-2/FLU/RSV testing.  Fact Sheet for Patients: bloggercourse.com  Fact Sheet for Healthcare Providers: seriousbroker.it  This test is not yet approved or cleared by the [place]  FDA and has been authorized for detection and/or diagnosis of SARS-CoV-2 by FDA under an Emergency Use Authorization (EUA). This EUA will remain in effect (meaning this test can be used) for the duration of the COVID-19 declaration under Section 564(b)(1) of the Act, 21 U.S.C. section 360bbb-3(b)(1), unless the authorization is terminated or revoked.     Resp Syncytial Virus by PCR NEGATIVE NEGATIVE Final    Comment: (NOTE) Fact Sheet for Patients: bloggercourse.com  Fact Sheet for Healthcare Providers: seriousbroker.it  This test is not yet approved or cleared by the [place]  FDA and has been authorized for detection and/or  diagnosis of SARS-CoV-2 by FDA under an Emergency Use Authorization (EUA). This EUA will remain in effect (meaning this test can be used) for the duration of the COVID-19 declaration under Section 564(b)(1) of the Act, 21 U.S.C. section 360bbb-3(b)(1), unless the authorization is terminated or revoked.  Performed at [facility], [address] [place], [place] [address]   Respiratory (~20 pathogens) panel by PCR     Status: None   Collection Time: 02/25/24  2:25 PM   Specimen: Nasopharyngeal Swab; Respiratory  Result Value Ref Range Status   Adenovirus NOT DETECTED NOT DETECTED Final   Coronavirus 229E NOT DETECTED NOT DETECTED Final    Comment: (NOTE) The Coronavirus on the Respiratory Panel, DOES NOT test for the novel  Coronavirus (2019 nCoV)    Coronavirus HKU1 NOT DETECTED NOT DETECTED Final   Coronavirus NL63 NOT DETECTED NOT DETECTED Final   Coronavirus OC43 NOT DETECTED NOT DETECTED Final   Metapneumovirus  NOT DETECTED NOT DETECTED Final   Rhinovirus / Enterovirus NOT DETECTED NOT DETECTED Final   Influenza A NOT DETECTED NOT DETECTED Final   Influenza B NOT DETECTED NOT DETECTED Final   Parainfluenza Virus 1 NOT DETECTED NOT DETECTED Final   Parainfluenza Virus 2 NOT DETECTED NOT DETECTED Final   Parainfluenza Virus 3 NOT DETECTED NOT DETECTED Final   Parainfluenza Virus 4 NOT DETECTED NOT DETECTED Final   Respiratory Syncytial Virus NOT DETECTED NOT DETECTED Final   Bordetella pertussis NOT DETECTED NOT DETECTED Final   Bordetella Parapertussis NOT DETECTED NOT DETECTED Final   Chlamydophila pneumoniae NOT DETECTED NOT DETECTED Final   Mycoplasma pneumoniae NOT DETECTED NOT DETECTED Final    Comment: Performed at [facility], [address] , [place] [address]     Scheduled Meds:  arformoterol   15 mcg Nebulization BID   ARIPiprazole   2 mg Oral Daily   aspirin  EC  81 mg Oral Daily   budesonide  (PULMICORT ) nebulizer solution  0.5 mg Nebulization BID    buPROPion   150 mg Oral q AM   doxycycline   100 mg Oral Q12H   heparin   5,000 Units Subcutaneous Q8H   ipratropium-albuterol   3 mL Nebulization BID   memantine   5 mg Oral BID   methylPREDNISolone  (SOLU-MEDROL ) injection  60 mg Intravenous Q12H   omega-3 acid ethyl esters  2 capsule Oral BID   pantoprazole   40 mg Oral Daily   polyethylene glycol  17 g Oral Daily   rOPINIRole   1 mg Oral Daily   And   rOPINIRole   2 mg Oral QHS   sertraline   50 mg Oral q AM   Continuous Infusions:  cefTRIAXone  (ROCEPHIN )  IV 1 g (02/27/24 1511)    Procedures/Studies: DG Hip Unilat W or Wo Pelvis 2-3 Views Left Result Date: 02/25/2024 CLINICAL DATA:  Left hip pain following a fall today. EXAM: DG HIP (WITH OR WITHOUT PELVIS) 2-3V LEFT COMPARISON:  Pelvis CT dated 12/30/2023. FINDINGS: Interval comminuted left pubic bone fracture including the medial aspect of the superior pubic ramus. No hip fracture or dislocation. Previously demonstrated lumbosacral spine fixation hardware. IMPRESSION: Interval comminuted left pubic bone fracture without hip fracture or dislocation. Electronically Signed   By: Elspeth Bathe M.D.   On: 02/25/2024 12:07   DG Ribs Unilateral W/Chest Right Result Date: 02/25/2024 CLINICAL DATA:  Right lower anterior rib pain and tenderness following a fall. EXAM: RIGHT RIBS AND CHEST - 3+ VIEW COMPARISON:  Portable chest dated 12/30/2023. FINDINGS: Stable enlarged cardiac silhouette. Interval small amount of lung consolidation and possible small pleural effusion at the left lateral lung base. Interval mild atelectasis in the left mid and lower lung zones. Clear right lung. Stable mild prominence of the pulmonary vasculature. Diffuse osteopenia. Mildly displaced right lateral 10th rib fracture. The anterior ribs are not adequately visualized at the location of patient pain, marked with a metallic marker, due to underexposure of the overlying soft tissues. No pneumothorax seen. IMPRESSION: 1. Mildly  displaced right lateral 10th rib fracture. 2. The anterior ribs are not adequately visualized at the location of patient pain due to underexposure of the overlying soft tissues. 3. No pneumothorax. 4. Interval small amount of lung consolidation and possible small pleural effusion at the left lateral lung base. 5. Interval mild atelectasis in the left mid and lower lung zones. 6. Stable cardiomegaly and mild pulmonary vascular congestion. Electronically Signed   By: Elspeth Bathe M.D.   On: 02/25/2024 12:03  XR Wrist Complete Right Result Date: 02/24/2024 X-rays of the right wrist demonstrate appropriate interval healing of the distal radius fracture seen on prior radiographs.  There is notable callus formation.  Residual ulnar positivity has resulted from the shortening of the distal radius as a result of the fracture.  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   Alm Schneider, DO  [facility]  If 7PM-7AM, please contact night-coverage [email or web address] Password [email or web address] 02/27/2024, 4:25 PM   LOS: 1 day

## 2024-02-27 NOTE — Care Management Important Message (Signed)
 Important Message  Patient Details  Name: [name] MRN: [id number] Date of Birth: 1930-01-22   Important Message Given:  Yes - Medicare IM     Noemie Devivo L Sherri Levenhagen 02/27/2024, 2:35 PM

## 2024-02-27 NOTE — Significant Event (Signed)
° °      CROSS COVER NOTE  NAME: [name] MRN: [id number] DOB : 11/03/1929 ATTENDING PHYSICIAN: Evonnie Lenis, MD    Date of Service   02/27/2024   HPI/Events of Note   [facility] cross cover at [facility] received from urse Patient came in with multiple fractures, but not a candidate for surgery. She has not urinated all shift and bladder scan residual reading is 235. Plsease advise Thanks   HPI labs clinical course reviewed  Interventions   Assessment/Plan: LR 250 ml bolus x1, and encourage oral intake        Erminio LITTIE Cone NP [facility] Cross Cover 7pm-7am - check amion for availability Pager [phone]

## 2024-02-28 DIAGNOSIS — F0390 Unspecified dementia without behavioral disturbance: Secondary | ICD-10-CM | POA: Insufficient documentation

## 2024-02-28 DIAGNOSIS — M316 Other giant cell arteritis: Secondary | ICD-10-CM | POA: Diagnosis not present

## 2024-02-28 DIAGNOSIS — S32592A Other specified fracture of left pubis, initial encounter for closed fracture: Secondary | ICD-10-CM | POA: Diagnosis not present

## 2024-02-28 DIAGNOSIS — J441 Chronic obstructive pulmonary disease with (acute) exacerbation: Secondary | ICD-10-CM | POA: Diagnosis not present

## 2024-02-28 LAB — URINE CULTURE: Culture: >=100000 — AB

## 2024-02-28 MED ORDER — CIPROFLOXACIN HCL 500 MG PO TABS: 500.0000 mg | ORAL_TABLET | Freq: Two times a day (BID) | ORAL | 0 refills | Status: DC

## 2024-02-28 MED ORDER — PREDNISONE 5 MG PO TABS: 5.0000 mg | ORAL_TABLET | Freq: Every day | ORAL | Status: AC

## 2024-02-28 MED ORDER — CIPROFLOXACIN HCL 250 MG PO TABS
500.0000 mg | ORAL_TABLET | Freq: Two times a day (BID) | ORAL | Status: DC
  Administered 2024-02-28: 500 mg via ORAL
  Filled 2024-02-28: qty 2

## 2024-02-28 MED ORDER — PREDNISONE 20 MG PO TABS: 60.0000 mg | ORAL_TABLET | Freq: Every day | ORAL | Status: DC

## 2024-02-28 MED ORDER — PREDNISONE 10 MG PO TABS: 60.0000 mg | ORAL_TABLET | Freq: Every day | ORAL | 0 refills | Status: DC

## 2024-02-28 MED ORDER — FOLIC ACID 1 MG PO TABS
1.0000 mg | ORAL_TABLET | Freq: Every day | ORAL | Status: DC
  Administered 2024-02-28: 1 mg via ORAL
  Filled 2024-02-28: qty 1

## 2024-02-28 MED ORDER — HYDROCODONE-ACETAMINOPHEN 5-325 MG PO TABS: 1.0000 | ORAL_TABLET | Freq: Four times a day (QID) | ORAL | 0 refills | Status: AC | PRN

## 2024-02-28 MED ORDER — CEPHALEXIN 250 MG PO CAPS: 250.0000 mg | ORAL_CAPSULE | Freq: Every day | ORAL | Status: DC

## 2024-02-28 MED ORDER — FOLIC ACID 1 MG PO TABS: 1.0000 mg | ORAL_TABLET | Freq: Every day | ORAL | Status: AC

## 2024-02-28 NOTE — Plan of Care (Signed)

## 2024-02-28 NOTE — Discharge Summary (Signed)
 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 The patient's daughter at the bedside supplements the history. The patient is a difficult historian secondary to her dementia. The patient was recently admitted to the hospital from 12/30/2023 to 01/03/2024 after sustaining mechanical fall and right radial fracture.  She was managed nonoperatively.  During that hospitalization, she was noted to have transverse colon thickening with liver mass.  A biopsy was not pursued.  Family does not wish to pursue this any further given the patient's dementia and advanced age.  She was discharged home with home health physical therapy.  The patient did make some improvements and was able to bear weight at once again.  Unfortunately, she used her wheelchair as a walker on the day of admission and sustained a mechanical fall going down a ramp.  It was unclear whether the patient had syncope.  The patient's son heard the patient yell after the fall.  Daughter states that the patient has had more wheezing and some shortness of breath in the past week.  There is not been any complaints of fevers, chills, hemoptysis, nausea, vomiting, diarrhea abdominal pain, chest pain. In the ED, the patient was afebrile and hemodynamically stable with oxygen  saturation 98% on 2 L.  WBC 11.2, hemoglobin 10.9, platelets 175.  Sodium 142, potassium 4.4, bicarbonate 29, serum  creatinine 0.82.  X-ray of the pelvis showed comminuted left pubic bone fracture including the superior pubic ramus medially.  X-ray of the ribs shows a displaced right lateral rib fracture without pneumothorax.  There was a suggestion of possible left lower lobe opacity.  UA showed 11-20 WBC.  EKG showed sinus rhythm and IVCD.  Assessment and Plan: Acute asthma exacerbation -Started DuoNebs - Started Pulmicort  - Started Brovana  - Continue IV Solu-Medrol >>d/c home with prednisone  dapter - Check COVID--neg - Viral respiratory panel--neg   Left pubic bone fracture, closed - PT evaluation>>SNF - family wishes to take patient home with HHPT/OT - Continue nonoperative management - Judicious opioids   Acute metabolic Encephalopathy - daughter noted pt less interactive and more confusion on day of admission - due to UTI and asthma exacerbation -12/11--improved, back to baseline  Chronic respiratory failure with hypoxia -chronically on 2L at home -stable   Major neurocognitive disorder - Continue Namenda    Depression - Continue sertraline  and Abilify    UTI--E cloacae - started on ceftriaxone  during hospitalization - Follow urine culture - d/c home with ciproflox x 4 more days   LLL opacity - Started ceftriaxone  and doxycycline >>d/c home with cipro  as above - Check PCT--0.14 - Check COVID-19/RSV/flu--neg   Polymyalgia rheumatica/giant cell arteritis - She is chronically on prednisone  5 mg daily - Outpatient follow-up with rheumatology - now on IV steroids inpatient>>restart prednisone  when done with prednisone  taper from hospitalization   Right distal radius fracture - Outpatient follow-up with [facility] - Remain in splint      Consultants: none Procedures performed: none  Disposition: Home Diet recommendation:  Cardiac diet DISCHARGE MEDICATION: Allergies as of 02/28/2024       Reactions   Oxycodone  Itching, Other (See Comments)   Hallucinations   Statins  Other (See Comments)   Myalgia    Advair Diskus [fluticasone -salmeterol] Other (See Comments)   Mouth sores   Atorvastatin  Other (See Comments)   Severe headache   Sulfonamide Derivatives Rash        Medication List     STOP taking these medications    guaiFENesin -dextromethorphan 100-10 MG/5ML syrup Commonly known as: ROBITUSSIN DM       TAKE these medications    rOPINIRole  1 MG tablet Commonly known as: REQUIP  Take 1 tablet (1 mg total) by mouth as directed. TAKE 1 TABLET IN THE MORNING AND 2 TABLETS IN THE EVENING The timing of this medication is very important.   acetaminophen  500 MG tablet Commonly known as: TYLENOL  Take 1 tablet (500 mg total) by mouth 3 (three) times daily. What changed: when to take this   albuterol  (2.5 MG/3ML) 0.083% nebulizer solution Commonly known as: PROVENTIL  Take 3 mLs (2.5 mg total) by nebulization every 6 (six) hours as needed for wheezing or shortness of breath. What changed:  when to take this Another medication with the same name was removed. Continue taking this medication, and follow the directions you see here.   ARIPiprazole  2 MG tablet Commonly known as: ABILIFY  TAKE 1 TABLET BY MOUTH EVERY DAY *NEW PRESCRIPTION REQUEST*   Aspirin  Low Dose 81 MG tablet Generic drug: aspirin  EC TAKE 1 TABLET BY MOUTH EVERY DAY *NEW PRESCRIPTION REQUEST* What changed: See the new instructions.   budesonide  1 MG/2ML nebulizer solution Commonly known as: PULMICORT  INHALE THE CONTENTS OF 1 VIAL VIA NEBULIZER TWICE DAILY *NEW PRESCRIPTION REQUEST*   buPROPion  150 MG 24 hr tablet Commonly known as: WELLBUTRIN  XL Take 1 tablet (150 mg total) by mouth in the morning.   calcium -vitamin D  500-200 MG-UNIT tablet Commonly known as: OSCAL WITH D Take 1 tablet by mouth 3 (three) times daily. What changed: when to take this   cephALEXin  250 MG capsule Commonly known as: KEFLEX  Take 1 capsule (250 mg total) by mouth daily. Resume after  finished with antibiotics from hospitalization What changed: additional instructions   ciprofloxacin  500 MG tablet Commonly known as: CIPRO  Take 1 tablet (500 mg total) by mouth 2 (two) times daily.   cyanocobalamin  1000 MCG/ML injection Commonly known as: VITAMIN B12 Inject 1 mL (1,000 mcg total) into the muscle every 30 (thirty) days.   diazepam  5 MG tablet Commonly known as: VALIUM  Take 1 tablet (5 mg total) by mouth See admin instructions. Take 5 mg by mouth in the evening AS NEEDED for restlessness and an additional 5 mg once a day as needed for anxiety   docusate sodium  100 MG capsule Commonly known as: COLACE Take 1 capsule (100 mg total) by mouth daily. What changed: when to take this   estradiol 0.1 MG/GM vaginal cream Commonly known as: ESTRACE Place 1 Applicatorful vaginally daily as needed (AS DIRECTED).   guaiFENesin  600 MG 12 hr tablet Commonly known as: MUCINEX  Take 600 mg by mouth 2 (two) times daily as needed for cough or to loosen phlegm.   HYDROcodone -acetaminophen  5-325 MG tablet Commonly known as: NORCO/VICODIN Take 1 tablet by mouth every 6 (six) hours as needed for moderate pain (pain score 4-6).   memantine  5 MG tablet Commonly known as: NAMENDA  Take 1 tablet (5 mg total) by mouth in  the morning and at bedtime.   omega-3 acid ethyl esters 1 g capsule Commonly known as: LOVAZA  Take 2 capsules (2 g total) by mouth 2 (two) times daily.   omeprazole  40 MG capsule Commonly known as: PRILOSEC TAKE ONE (1) CAPSULE BY MOUTH TWICE DAILY *NEW PRESCRIPTION REQUEST*   ondansetron  8 MG disintegrating tablet Commonly known as: ZOFRAN -ODT Take 1 tablet (8 mg total) by mouth every 8 (eight) hours as needed for nausea or vomiting. PLACE 1 TABLET ON TONGUE EVERY 8 HOURS AS NEEDED FOR NAUSEA OR VOMITING   polyethylene glycol 17 g packet Commonly known as: MIRALAX  / GLYCOLAX  Take 17 g by mouth daily.   predniSONE  5 MG tablet Commonly known as: DELTASONE  Take  1 tablet (5 mg total) by mouth at bedtime. Resume after finished with prednisone  taper from hospitalization What changed: additional instructions   predniSONE  10 MG tablet Commonly known as: DELTASONE  Take 6 tablets (60 mg total) by mouth daily with breakfast. And decrease by one tablet daily Start taking on: February 29, 2024 What changed: You were already taking a medication with the same name, and this prescription was added. Make sure you understand how and when to take each.   Probiotic Chew Chew 1 each by mouth every other day.   senna-docusate 8.6-50 MG tablet Commonly known as: Senokot-S Take 1 tablet by mouth 2 (two) times daily. What changed: when to take this   sertraline  50 MG tablet Commonly known as: ZOLOFT  Take 1 tablet (50 mg total) by mouth in the morning.   Spiriva  Respimat 2.5 MCG/ACT Aers Generic drug: Tiotropium Bromide  Inhale 2 Inhalations into the lungs daily at 12 noon.   SYRINGE 3CC/25GX1 25G X 1 3 ML Misc Use to give IM B12 injection   Systane 0.4-0.3 % Soln Generic drug: Polyethyl Glycol-Propyl Glycol Place 1 drop into both eyes 4 (four) times daily as needed (for dryness).   tocilizumab  4 mg/kg in sodium chloride  0.9 % Inject 280 mg into the vein every 28 (twenty-eight) days.        Contact information for follow-up providers     [facility]) Follow up.   Specialty: Home Health Services Why: PT will schedule your next home visit. Contact information: [address] [place]  [address] [phone]             Contact information for after-discharge care     Home Medical Care     [facility]) .   Service: Home Health Services Contact information: [address] [place]  [address] [phone]                    Discharge Exam: Fredricka Weights   02/25/24 1050 02/25/24 1415  Weight: 77.3 kg  73.8 kg   HEENT:  [place]/AT, No thrush, no icterus CV:  RRR, no rub, no S3, no S4 Lung:  bibasilar rales.  Minimal basilar wheeze Abd:  soft/+BS, NT Ext:  No edema, no lymphangitis, no synovitis, no rash   Condition at discharge: stable  The results of significant diagnostics from this hospitalization (including imaging, microbiology, ancillary and laboratory) are listed below for reference.   Imaging Studies: DG Hip Unilat W or Wo Pelvis 2-3 Views Left Result Date: 02/25/2024 CLINICAL DATA:  Left hip pain following a fall today. EXAM: DG HIP (WITH OR WITHOUT PELVIS) 2-3V LEFT COMPARISON:  Pelvis CT dated 12/30/2023. FINDINGS: Interval comminuted  left pubic bone fracture including the medial aspect of the superior pubic ramus. No hip fracture or dislocation. Previously demonstrated lumbosacral spine fixation hardware. IMPRESSION: Interval comminuted left pubic bone fracture without hip fracture or dislocation. Electronically Signed   By: Elspeth Bathe M.D.   On: 02/25/2024 12:07   DG Ribs Unilateral W/Chest Right Result Date: 02/25/2024 CLINICAL DATA:  Right lower anterior rib pain and tenderness following a fall. EXAM: RIGHT RIBS AND CHEST - 3+ VIEW COMPARISON:  Portable chest dated 12/30/2023. FINDINGS: Stable enlarged cardiac silhouette. Interval small amount of lung consolidation and possible small pleural effusion at the left lateral lung base. Interval mild atelectasis in the left mid and lower lung zones. Clear right lung. Stable mild prominence of the pulmonary vasculature. Diffuse osteopenia. Mildly displaced right lateral 10th rib fracture. The anterior ribs are not adequately visualized at the location of patient pain, marked with a metallic marker, due to underexposure of the overlying soft tissues. No pneumothorax seen. IMPRESSION: 1. Mildly displaced right lateral 10th rib fracture. 2. The anterior ribs are not adequately visualized at the location of patient pain due to underexposure of  the overlying soft tissues. 3. No pneumothorax. 4. Interval small amount of lung consolidation and possible small pleural effusion at the left lateral lung base. 5. Interval mild atelectasis in the left mid and lower lung zones. 6. Stable cardiomegaly and mild pulmonary vascular congestion. Electronically Signed   By: Elspeth Bathe M.D.   On: 02/25/2024 12:03   XR Wrist Complete Right Result Date: 02/24/2024 X-rays of the right wrist demonstrate appropriate interval healing of the distal radius fracture seen on prior radiographs.  There is notable callus formation.  Residual ulnar positivity has resulted from the shortening of the distal radius as a result of the fracture.  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   Microbiology: Results for orders placed or performed during the hospital encounter of 02/25/24  Urine Culture     Status: Abnormal   Collection Time: 02/25/24 11:11 AM   Specimen: Urine, Clean Catch  Result Value Ref Range Status   Specimen Description   Final    URINE, CLEAN CATCH Performed at [facility], [address] [place], [place] [address]    Special Requests   Final    NONE Performed at [facility], [address] [place], [place] [address]    Culture >=100,000 COLONIES/mL ENTEROBACTER CLOACAE (A)  Final   Report Status 02/28/2024 FINAL  Final   Organism ID, Bacteria ENTEROBACTER CLOACAE (A)  Final      Susceptibility   Enterobacter cloacae - MIC*    CEFEPIME 4 INTERMEDIATE Intermediate     CIPROFLOXACIN  <=0.06 SENSITIVE Sensitive     GENTAMICIN <=1 SENSITIVE Sensitive     IMIPENEM 1 SENSITIVE Sensitive     NITROFURANTOIN 64 INTERMEDIATE Intermediate     TRIMETH/SULFA <=20 SENSITIVE Sensitive     PIP/TAZO Value in next row Resistant      >=128 RESISTANTThis is a modified FDA-approved test that has been validated and its performance characteristics determined by the reporting laboratory.  This laboratory is certified under the Clinical Laboratory Improvement  Amendments CLIA as qualified to perform high complexity clinical laboratory testing.    MEROPENEM Value in next row Sensitive      >=128 RESISTANTThis is a modified FDA-approved test that has been validated and its performance characteristics determined by the reporting laboratory.  This laboratory is certified under the Clinical Laboratory Improvement Amendments CLIA as qualified to perform high  complexity clinical laboratory testing.    AMIKACIN Value in next row Sensitive      >=128 RESISTANTThis is a modified FDA-approved test that has been validated and its performance characteristics determined by the reporting laboratory.  This laboratory is certified under the Clinical Laboratory Improvement Amendments CLIA as qualified to perform high complexity clinical laboratory testing.    CEFTAZIDIME/AVIBACTAM Value in next row Sensitive      >=128 RESISTANTThis is a modified FDA-approved test that has been validated and its performance characteristics determined by the reporting laboratory.  This laboratory is certified under the Clinical Laboratory Improvement Amendments CLIA as qualified to perform high complexity clinical laboratory testing.    CEFTOLOZANE/TAZOBACTAM Value in next row Sensitive      >=128 RESISTANTThis is a modified FDA-approved test that has been validated and its performance characteristics determined by the reporting laboratory.  This laboratory is certified under the Clinical Laboratory Improvement Amendments CLIA as qualified to perform high complexity clinical laboratory testing.    MEROPENEM/VABORBACTAM Value in next row Sensitive ug/mL     >=128 RESISTANTThis is a modified FDA-approved test that has been validated and its performance characteristics determined by the reporting laboratory.  This laboratory is certified under the Clinical Laboratory Improvement Amendments CLIA as qualified to perform high complexity clinical laboratory testing.    TOBRAMYCIN Value in next row  Sensitive      >=128 RESISTANTThis is a modified FDA-approved test that has been validated and its performance characteristics determined by the reporting laboratory.  This laboratory is certified under the Clinical Laboratory Improvement Amendments CLIA as qualified to perform high complexity clinical laboratory testing.    * >=100,000 COLONIES/mL ENTEROBACTER CLOACAE  Resp panel by RT-PCR (RSV, Flu A&B, Covid) Anterior Nasal Swab     Status: None   Collection Time: 02/25/24  1:51 PM   Specimen: Anterior Nasal Swab  Result Value Ref Range Status   SARS Coronavirus 2 by RT PCR NEGATIVE NEGATIVE Final    Comment: (NOTE) SARS-CoV-2 target nucleic acids are NOT DETECTED.  The SARS-CoV-2 RNA is generally detectable in upper respiratory specimens during the acute phase of infection. The lowest concentration of SARS-CoV-2 viral copies this assay can detect is 138 copies/mL. A negative result does not preclude SARS-Cov-2 infection and should not be used as the sole basis for treatment or other patient management decisions. A negative result may occur with  improper specimen collection/handling, submission of specimen other than nasopharyngeal swab, presence of viral mutation(s) within the areas targeted by this assay, and inadequate number of viral copies(<138 copies/mL). A negative result must be combined with clinical observations, patient history, and epidemiological information. The expected result is Negative.  Fact Sheet for Patients:  bloggercourse.com  Fact Sheet for Healthcare Providers:  seriousbroker.it  This test is no t yet approved or cleared by the [place]  FDA and  has been authorized for detection and/or diagnosis of SARS-CoV-2 by FDA under an Emergency Use Authorization (EUA). This EUA will remain  in effect (meaning this test can be used) for the duration of the COVID-19 declaration under Section 564(b)(1) of the Act,  21 U.S.C.section 360bbb-3(b)(1), unless the authorization is terminated  or revoked sooner.       Influenza A by PCR NEGATIVE NEGATIVE Final   Influenza B by PCR NEGATIVE NEGATIVE Final    Comment: (NOTE) The Xpert Xpress SARS-CoV-2/FLU/RSV plus assay is intended as an aid in the diagnosis of influenza from Nasopharyngeal swab specimens and should not be used as a  sole basis for treatment. Nasal washings and aspirates are unacceptable for Xpert Xpress SARS-CoV-2/FLU/RSV testing.  Fact Sheet for Patients: bloggercourse.com  Fact Sheet for Healthcare Providers: seriousbroker.it  This test is not yet approved or cleared by the [place]  FDA and has been authorized for detection and/or diagnosis of SARS-CoV-2 by FDA under an Emergency Use Authorization (EUA). This EUA will remain in effect (meaning this test can be used) for the duration of the COVID-19 declaration under Section 564(b)(1) of the Act, 21 U.S.C. section 360bbb-3(b)(1), unless the authorization is terminated or revoked.     Resp Syncytial Virus by PCR NEGATIVE NEGATIVE Final    Comment: (NOTE) Fact Sheet for Patients: bloggercourse.com  Fact Sheet for Healthcare Providers: seriousbroker.it  This test is not yet approved or cleared by the [place]  FDA and has been authorized for detection and/or diagnosis of SARS-CoV-2 by FDA under an Emergency Use Authorization (EUA). This EUA will remain in effect (meaning this test can be used) for the duration of the COVID-19 declaration under Section 564(b)(1) of the Act, 21 U.S.C. section 360bbb-3(b)(1), unless the authorization is terminated or revoked.  Performed at [facility], [address] [place], [place] [address]   Respiratory (~20 pathogens) panel by PCR     Status: None   Collection Time: 02/25/24  2:25 PM   Specimen: Nasopharyngeal Swab; Respiratory   Result Value Ref Range Status   Adenovirus NOT DETECTED NOT DETECTED Final   Coronavirus 229E NOT DETECTED NOT DETECTED Final    Comment: (NOTE) The Coronavirus on the Respiratory Panel, DOES NOT test for the novel  Coronavirus (2019 nCoV)    Coronavirus HKU1 NOT DETECTED NOT DETECTED Final   Coronavirus NL63 NOT DETECTED NOT DETECTED Final   Coronavirus OC43 NOT DETECTED NOT DETECTED Final   Metapneumovirus NOT DETECTED NOT DETECTED Final   Rhinovirus / Enterovirus NOT DETECTED NOT DETECTED Final   Influenza A NOT DETECTED NOT DETECTED Final   Influenza B NOT DETECTED NOT DETECTED Final   Parainfluenza Virus 1 NOT DETECTED NOT DETECTED Final   Parainfluenza Virus 2 NOT DETECTED NOT DETECTED Final   Parainfluenza Virus 3 NOT DETECTED NOT DETECTED Final   Parainfluenza Virus 4 NOT DETECTED NOT DETECTED Final   Respiratory Syncytial Virus NOT DETECTED NOT DETECTED Final   Bordetella pertussis NOT DETECTED NOT DETECTED Final   Bordetella Parapertussis NOT DETECTED NOT DETECTED Final   Chlamydophila pneumoniae NOT DETECTED NOT DETECTED Final   Mycoplasma pneumoniae NOT DETECTED NOT DETECTED Final    Comment: Performed at [facility], [address] [place], [place] [address]    Labs: CBC: Recent Labs  Lab 02/25/24 1142 02/26/24 0447  WBC 11.2* 12.2*  HGB 10.9* 11.0*  HCT 35.5* 34.9*  MCV 100.6* 99.1  PLT 175 190   Basic Metabolic Panel: Recent Labs  Lab 02/25/24 1142 02/26/24 0447  NA 142 140  K 4.4 4.6  CL 102 101  CO2 29 28  GLUCOSE 103* 145*  BUN 15 18  CREATININE 0.82 0.90  CALCIUM  9.1 9.2  MG  --  2.3   Liver Function Tests: No results for input(s): AST, ALT, ALKPHOS, BILITOT, PROT, ALBUMIN in the last 168 hours. CBG: No results for input(s): GLUCAP in the last 168 hours.  Discharge time spent: greater than 30 minutes.  Signed: Alm Schneider, MD [facility] 02/28/2024

## 2024-02-28 NOTE — TOC Transition Note (Signed)
 Transition of Care [facility]) - Discharge Note   Patient Details  Name: [name] MRN: [id number] Date of Birth: Jan 18, 1930  [facility] CM/SW Contact:  Noreen KATHEE Pinal, LCSWA Phone Number: 02/28/2024, 11:13 AM   Clinical Narrative:     Patient is DC today home. CSW spoke with daughter, Donzell and updated her on [facility] having active orders to continue therapy. Daughter declined OT. CSW updated Delon with [facility] about family only wanting PT and RN. MD made aware to cancel OT. CSW signing off.   Final next level of care: Home w Home Health Services Barriers to Discharge: Barriers Resolved   Patient Goals and CMS Choice Patient states their goals for this hospitalization and ongoing recovery are:: return home CMS Medicare.gov Compare Post Acute Care list provided to:: Patient Represenative (must comment) (daughter - Donzell) Choice offered to / list presented to : Adult Children [place] ownership interest in [facility].provided to:: Adult Children    Discharge Placement                Patient to be transferred to facility by: Family Name of family member notified: Donzell Patient and family notified of of transfer: 02/28/24  Discharge Plan and Services Additional resources added to the After Visit Summary for                            [facility] Arranged: RN, PT [employer] Agency: [facility] Date [facility] Agency Contacted: 02/28/24 Time HH Agency Contacted: 1113 Representative spoke with at [facility] Agency: Delon  Social Drivers of Health (SDOH) Interventions SDOH Screenings   Food Insecurity: No Food Insecurity (02/25/2024)  Housing: Unknown (02/25/2024)  Transportation Needs: No Transportation Needs (02/25/2024)  Utilities: Not At Risk (02/25/2024)  Alcohol  Screen: Low Risk (08/26/2023)  Depression (PHQ2-9): Medium Risk (09/30/2023)  Financial Resource Strain: Low Risk (08/26/2023)  Physical Activity: Insufficiently Active (08/26/2023)   Social Connections: Socially Isolated (02/25/2024)  Stress: No Stress Concern Present (08/26/2023)  Tobacco Use: Low Risk (02/03/2024)   Received from [facility]  Health Literacy: Adequate Health Literacy (08/26/2023)     Readmission Risk Interventions    02/28/2024   11:11 AM  Readmission Risk Prevention Plan  Transportation Screening Complete  HRI or Home Care Consult Complete  Social Work Consult for Recovery Care Planning/Counseling Complete  Palliative Care Screening Not Applicable  Medication Review [employer]) Complete

## 2024-03-01 ENCOUNTER — Telehealth: Payer: Self-pay | Admitting: *Deleted

## 2024-03-01 NOTE — Transitions of Care (Post Inpatient/ED Visit) (Signed)
 03/01/2024  Name: [name] MRN: [id number] DOB: [birth year]  Today's TOC FU Call Status: Today's TOC FU Call Status:: Successful TOC FU Call Completed TOC FU Call Complete Date: 03/01/24  Patient's Name and Date of Birth confirmed. DOB, Name (HIPAA verified per DPR daughter [name])  Transition Care Management Follow-up Telephone Call Date of Discharge: 02/28/24 [facility] Type of Discharge: Inpatient Admission Primary Inpatient Discharge Diagnosis:: Closed fracture of multiple pubic rami, left, initial encounter ( How have you been since you were released from the hospital?: Better (eating, drinking well, ambulating with walker) Any questions or concerns?: No  Items Reviewed: Did you receive and understand the discharge instructions provided?: Yes Medications obtained,verified, and reconciled?: Yes (Medications Reviewed) Any new allergies since your discharge?: No Dietary orders reviewed?: Yes Type of Diet Ordered:: heart healthy Do you have support at home?: Yes People in Home [RPT]: child(ren), adult Name of Support/Comfort Primary Source: patient's son lives with her,  daughter sees pt daily and assists with all aspects of care Reviewed safety precautions Reviewed signs /symptoms of infection, UTI, prevention  Medications Reviewed Today: Medications Reviewed Today     Reviewed by Aura Mliss LABOR, RN (Registered Nurse) on 03/01/24 at 1218  Med List Status: <None>   Medication Order Taking? Sig Documenting Provider Last Dose Status Informant  acetaminophen  (TYLENOL ) 500 MG tablet [id number] Yes Take 1 tablet (500 mg total) by mouth 3 (three) times daily. Regalado, Belkys A, MD  Active Child, Pharmacy Records  albuterol  (PROVENTIL ) (2.5 MG/3ML) 0.083% nebulizer solution [id number] Yes Take 3 mLs (2.5 mg total) by nebulization every 6 (six) hours as needed for wheezing or shortness of breath. Wendolyn Jenkins Jansky, MD  Active Child, Pharmacy Records   ARIPiprazole  (ABILIFY ) 2 MG tablet [id number] Yes TAKE 1 TABLET BY MOUTH EVERY DAY *NEW PRESCRIPTION REQUEST* Wendolyn Jenkins Jansky, MD  Active Child, Pharmacy Records  ASPIRIN  LOW DOSE 81 MG tablet [id number] Yes TAKE 1 TABLET BY MOUTH EVERY DAY *NEW PRESCRIPTION REQUEST* Wendolyn Jenkins Jansky, MD  Active Child, Pharmacy Records  budesonide  (PULMICORT ) 1 MG/2ML nebulizer solution [id number] Yes INHALE THE CONTENTS OF 1 VIAL VIA NEBULIZER TWICE DAILY *NEW PRESCRIPTION REQUESTDEWAINE Wendolyn Jenkins Jansky, MD  Active Child, Pharmacy Records  buPROPion  (WELLBUTRIN  XL) 150 MG 24 hr tablet [id number] Yes Take 1 tablet (150 mg total) by mouth in the morning. Wendolyn Jenkins Jansky, MD  Active Child, Pharmacy Records  calcium -vitamin D  [facility] WITH D) 500-200 MG-UNIT tablet [id number] Yes Take 1 tablet by mouth 3 (three) times daily. Jerri Kay HERO, MD  Active Child, Pharmacy Records  cephALEXin  (KEFLEX ) 250 MG capsule [id number] Yes Take 1 capsule (250 mg total) by mouth daily. Resume after finished with antibiotics from hospitalization Tat, Alm, MD  Active   ciprofloxacin  (CIPRO ) 500 MG tablet [id number] Yes Take 1 tablet (500 mg total) by mouth 2 (two) times daily. Evonnie Alm, MD  Active   cyanocobalamin  (VITAMIN B12) 1000 MCG/ML injection [id number] Yes Inject 1 mL (1,000 mcg total) into the muscle every 30 (thirty) days. Wendolyn Jenkins Jansky, MD  Active Child, Pharmacy Records  diazepam  (VALIUM ) 5 MG tablet [id number] Yes Take 1 tablet (5 mg total) by mouth See admin instructions. Take 5 mg by mouth in the evening AS NEEDED for restlessness and an additional 5 mg once a day as needed for anxiety Wendolyn Jenkins Jansky, MD  Active Child, Pharmacy Records  docusate sodium  (COLACE) 100 MG capsule [id number] Yes Take 1 capsule (100 mg total) by mouth  daily. Wendolyn Jenkins Jansky, MD  Active Child, Pharmacy Records  estradiol (ESTRACE) 0.1 MG/GM vaginal cream [id number] Yes Place 1 Applicatorful vaginally daily as needed (AS DIRECTED). [provider]   Active Child, Pharmacy Records  folic acid  (FOLVITE ) 1 MG tablet [id number]  Take 1 tablet (1 mg total) by mouth daily.  Patient not taking: Reported on 03/01/2024   Tat, Alm, MD  Active   guaiFENesin  (MUCINEX ) 600 MG 12 hr tablet [id number] Yes Take 600 mg by mouth 2 (two) times daily as needed for cough or to loosen phlegm. [provider]  Active Child, Pharmacy Records  HYDROcodone -acetaminophen  (NORCO/VICODIN) 5-325 MG tablet [id number] Yes Take 1 tablet by mouth every 6 (six) hours as needed for moderate pain (pain score 4-6). Evonnie Alm, MD  Active   memantine  (NAMENDA ) 5 MG tablet [id number] Yes Take 1 tablet (5 mg total) by mouth in the morning and at bedtime. Wendolyn Jenkins Jansky, MD  Active Child, Pharmacy Records  omega-3 acid ethyl esters (LOVAZA ) 1 g capsule [id number] Yes Take 2 capsules (2 g total) by mouth 2 (two) times daily. Wendolyn Jenkins Jansky, MD  Active Child, Pharmacy Records  omeprazole  [facility]) 40 MG capsule [id number] Yes TAKE ONE (1) CAPSULE BY MOUTH TWICE DAILY *NEW PRESCRIPTION REQUEST* Wendolyn Jenkins Jansky, MD  Active Child, Pharmacy Records  ondansetron  (ZOFRAN -ODT) 8 MG disintegrating tablet [id number] Yes Take 1 tablet (8 mg total) by mouth every 8 (eight) hours as needed for nausea or vomiting. PLACE 1 TABLET ON TONGUE EVERY 8 HOURS AS NEEDED FOR NAUSEA OR VOMITING Wendolyn Jenkins Jansky, MD  Active Child, Pharmacy Records  polyethylene glycol (MIRALAX  / GLYCOLAX ) 17 g packet [id number] Yes Take 17 g by mouth daily. Wendolyn Jenkins Jansky, MD  Active Child, Pharmacy Records  predniSONE  (DELTASONE ) 10 MG tablet [id number] Yes Take 6 tablets (60 mg total) by mouth daily with breakfast. And decrease by one tablet daily Tat, David, MD  Active   predniSONE  (DELTASONE ) 5 MG tablet [id number] Yes Take 1 tablet (5 mg total) by mouth at bedtime. Resume after finished with prednisone  taper from hospitalization Tat, Alm, MD  Active   Probiotic CHEW [id number] Yes Chew 1 each by mouth every other  day. Wendolyn Jenkins Jansky, MD  Active Child, Pharmacy Records  rOPINIRole  (REQUIP ) 1 MG tablet [id number] Yes Take 1 tablet (1 mg total) by mouth as directed. TAKE 1 TABLET IN THE MORNING AND 2 TABLETS IN THE KARNA Wendolyn Jenkins Jansky, MD  Active Child, Pharmacy Records  senna-docusate (SENOKOT-S) 8.6-50 MG tablet [id number] Yes Take 1 tablet by mouth 2 (two) times daily. Regalado, Belkys A, MD  Active Child, Pharmacy Records  sertraline  (ZOLOFT ) 50 MG tablet [id number] Yes Take 1 tablet (50 mg total) by mouth in the morning. Wendolyn Jenkins Jansky, MD  Active Child, Pharmacy Records  Syringe/Needle, Disp, (SYRINGE 3CC/25GX1) 25G X 1 3 ML MISC [id number] Yes Use to give IM B12 injection Wendolyn Jenkins Jansky, MD  Active Child, Pharmacy Records  SYSTANE 0.4-0.3 % LARRAINE [id number] Yes Place 1 drop into both eyes 4 (four) times daily as needed (for dryness). Wendolyn Jenkins Jansky, MD  Active Child, Pharmacy Records  Tiotropium Bromide  (SPIRIVA  RESPIMAT) 2.5 MCG/ACT AERS [id number] Yes Inhale 2 Inhalations into the lungs daily at 12 noon. Wendolyn Jenkins Jansky, MD  Active Child, Pharmacy Records  tocilizumab  4 mg/kg in sodium chloride  0.9 % [id number] Yes Inject 280 mg into the vein every 28 (twenty-eight) days. [provider]  Active Child, Pharmacy Records  Med  List Note (Ward, Angelica, CPhT 02/25/24 1812): [facility] for long term meds and [facility] [place] for [facility]            Home Care and Equipment/Supplies: Were Home Health Services Ordered?:  (spoke wtih Dorthea at [facility] , resumption of care will be 12/16, they will call pt tonight to schedule a time, patient's daughter verbalizes understanding) Name of Home Health Agency:: [facility] Has Agency set up a time to come to your home?: No EMR reviewed for Home Health Orders: Orders present/patient has not received call (refer to CM for follow-up) Any new equipment or medical supplies ordered?: No  Functional Questionnaire: Do you need assistance  with bathing/showering or dressing?:  (daughter assists) Do you need assistance with meal preparation?:  (daughter assists) Do you need assistance with eating?: No Do you have difficulty maintaining continence: No Do you need assistance with getting out of bed/getting out of a chair/moving?:  (walker) Do you have difficulty managing or taking your medications?:  (daughter provides oversight)  Follow up appointments reviewed: PCP Follow-up appointment confirmed?: Yes Date of PCP follow-up appointment?: 03/03/24 Follow-up Provider: Jenkins Carrel MD [facility] Follow-up appointment confirmed?:  (per daughter pt will not be following up with orthopedics, states  they said no need for that) Do you need transportation to your follow-up appointment?: No Do you understand care options if your condition(s) worsen?: Yes-patient verbalized understanding  SDOH Interventions Today    Flowsheet Row Most Recent Value  SDOH Interventions   Food Insecurity Interventions Intervention Not Indicated  Housing Interventions Intervention Not Indicated  Transportation Interventions Intervention Not Indicated  Utilities Interventions Intervention Not Indicated    Mliss Creed [facility], BSN RN Care Manager/ Transition of Care [place] Population Health [phone]

## 2024-03-03 ENCOUNTER — Ambulatory Visit (INDEPENDENT_AMBULATORY_CARE_PROVIDER_SITE_OTHER): Admitting: Family Medicine

## 2024-03-03 ENCOUNTER — Encounter: Payer: Self-pay | Admitting: Family Medicine

## 2024-03-03 VITALS — BP 136/64 | HR 74 | Temp 96.8°F | Ht 65.0 in | Wt 164.0 lb

## 2024-03-03 DIAGNOSIS — F418 Other specified anxiety disorders: Secondary | ICD-10-CM | POA: Diagnosis not present

## 2024-03-03 DIAGNOSIS — I10 Essential (primary) hypertension: Secondary | ICD-10-CM | POA: Diagnosis not present

## 2024-03-03 DIAGNOSIS — J449 Chronic obstructive pulmonary disease, unspecified: Secondary | ICD-10-CM

## 2024-03-03 DIAGNOSIS — S32592D Other specified fracture of left pubis, subsequent encounter for fracture with routine healing: Secondary | ICD-10-CM

## 2024-03-03 DIAGNOSIS — K5909 Other constipation: Secondary | ICD-10-CM

## 2024-03-03 DIAGNOSIS — I951 Orthostatic hypotension: Secondary | ICD-10-CM | POA: Diagnosis not present

## 2024-03-03 DIAGNOSIS — S32592A Other specified fracture of left pubis, initial encounter for closed fracture: Secondary | ICD-10-CM

## 2024-03-03 MED ORDER — FOLIC ACID 1 MG PO TABS: 1.0000 mg | ORAL_TABLET | Freq: Every day | ORAL | 3 refills | Status: AC

## 2024-03-03 NOTE — Patient Instructions (Addendum)
 It was very nice to see you today!  Happy Holidays  Amlodipine  2.5mg  if bp stays up to 170's regularly   PLEASE NOTE:  If you had any lab tests please let us  know if you have not heard back within a few days. You may see your results on MyChart before we have a chance to review them but we will give you a call once they are reviewed by us . If we ordered any referrals today, please let us  know if you have not heard from their office within the next week.   Please try these tips to maintain a healthy lifestyle:  Eat most of your calories during the day when you are active. Eliminate processed foods including packaged sweets (pies, cakes, cookies), reduce intake of potatoes, white bread, white pasta, and white rice. Look for whole grain options, oat flour or almond flour.  Each meal should contain half fruits/vegetables, one quarter protein, and one quarter carbs (no bigger than a computer mouse).  Cut down on sweet beverages. This includes juice, soda, and sweet tea. Also watch fruit intake, though this is a healthier sweet option, it still contains natural sugar! Limit to 3 servings daily.  Drink at least 1 glass of water with each meal and aim for at least 8 glasses per day  Exercise at least 150 minutes every week.

## 2024-03-03 NOTE — Progress Notes (Unsigned)
 Subjective:     Patient ID: [name], female    DOB: 01-29-1930, 89 y.o.   MRN: [id number]  Chief Complaint  Patient presents with   Hypertension    Pt is here for followup on chronic issues and also was in hospital for fall that broke  l Pelvis and upper R Rib    Discussed the use of AI scribe software for clinical note transcription with the patient, who gave verbal consent to proceed.  History of Present Illness [name] is a 89 year old female who presents for follow-up after a fall resulting in rib and pelvic fractures. She is accompanied by her daughter, who is her primary caregiver.  She experienced a fall while pushing her wheelchair down a ramp, which picked up speed, causing her to fall. This resulted in fractures of the upper right rib and pelvis, as found during her hospital stay. She has significant back pain. She is not currently receiving home physical therapy, but it is planned. She recently completed a type of physical therapy focused on teaching her how to get up, with good progress noted. Her pain management includes a prescription for pain medication, with 16 pills remaining from an initial 20 prescribed at discharge.  She has a history of a previous fall resulting in a wrist fracture, which has since healed. Her blood pressure management was adjusted in October when she was taken off olmesartan  40 mg due to hypotension upon standing. Blood pressure readings have been variable, with systolic values ranging from 125 to 177 and diastolic values from 41 to 101. No headaches, dizziness, or chest pain are reported. She can stand on her right leg with assistance but not independently.  She has experienced some swelling in her feet, particularly in the ankle that was previously broken. Her skin is dry, and she has not had a significant bowel movement since Sunday, despite taking pain medication. She has resumed taking Purelax and a stool softener to manage  constipation.  She has a history of a severe UTI, which may have contributed to confusion and the fall. A urine specimen was taken at the urologist's office a week ago, but no results have been communicated yet. Her daughter reports that her urine had a strong odor.  She is currently on folic acid  supplementation, taking two 400 mcg tablets to approximate a 1 mg dose. Her medications are managed by [facility], which is organizing her medications into pill packs.  Moods doing better on meds.     Health Maintenance Due  Topic Date Due   Bone Density Scan  06/09/2022    Past Medical History:  Diagnosis Date   Abdominal bloating    Abnormal LFTs (liver function tests)    Ankle fracture, bimalleolar, closed, right, initial encounter    Anxiety disorder    Arthritis    Asthma    Bimalleolar ankle fracture, right, closed, initial encounter 01/06/2019   Depression    Diverticulosis    Edema    Fatty liver    Fibromyalgia    GERD (gastroesophageal reflux disease)    Giant cell arteritis (HCC)    No biopsy secondary to steroid use   Hx of adenomatous colonic polyps    Hyperlipidemia    Hypertension    IBS (irritable bowel syndrome)    Lumbar radiculitis    Osteoporosis 09/2017   T score -2.6 left femoral neck   Peptic ulcer disease    Skin cancer    shoulder,  leg, right eye (? squamous by resection)   Thoracic radiculitis    Thyroid  nodule    removed   Urinary incontinence    Vertigo     Past Surgical History:  Procedure Laterality Date   ABDOMINAL HYSTERECTOMY     BSO   BACK SURGERY  2005   lower back   EYE SURGERY Right 2006   LIVER BIOPSY     fatty liver   NASAL SEPTUM SURGERY     ORIF ANKLE FRACTURE Right 01/11/2019   Procedure: OPEN REDUCTION INTERNAL FIXATION (ORIF) RIGHT BIMALLEOLAR ANKLE FRACTURE;  Surgeon: Jerri Kay HERO, MD;  Location: [facility];  Service: Orthopedics;  Laterality: Right;   right knee surgery  2006   arthroscopy    right leg skin cancer surgery  2014   right shoulder skin cancer excision  2012   SKIN CANCER EXCISION     THYROIDECTOMY      Current Medications[1]  Allergies[2] ROS neg/noncontributory except as noted HPI/below      Objective:     BP 136/64 (BP Location: Left Arm, Patient Position: Sitting, Cuff Size: Normal)   Pulse 74   Temp (!) 96.8 F (36 C) (Temporal)   Ht 5' 5 (1.651 m)   Wt 164 lb (74.4 kg)   SpO2 97%   BMI 27.29 kg/m  Wt Readings from Last 3 Encounters:  03/03/24 164 lb (74.4 kg)  02/25/24 162 lb 11.2 oz (73.8 kg)  12/30/23 168 lb 3.4 oz (76.3 kg)    Physical Exam VITALS: BP- 142/50 GENERAL: Well developed, well nourished, no acute distress. HEAD EYES EARS NOSE THROAT: Normocephalic, atraumatic, conjunctiva not injected, sclera nonicteric. CARDIAC: Regular rate and rhythm, S1 S2 present, no murmur, dorsalis pedis 2 plus bilaterally. NECK: Supple, no thyromegaly, no nodes, no carotid bruits. LUNGS: Clear to auscultation bilaterally, no wheezes. ABDOMEN: Bowel sounds present, soft, non-tender, non-distended, no hepatosplenomegaly, no masses. EXTREMITIES: Mild pedal edema observed. MUSCULOSKELETAL: w/c. Brace on R wrist.  Bruising R pinkie NEUROLOGICAL: Alert and oriented .  More interactive today, cranial nerves II through XII intact. PSYCHIATRIC: Normal mood, good eye contact. SKIN: Dry skin.  Reviewd hosp records Tried to stand pt to do orthostatics, but too unsteady d/t pain from pelvic fx       Assessment & Plan:  HYPERTENSION, BENIGN  Orthostatic hypotension  Chronic constipation  Closed fracture of multiple pubic rami, left, initial encounter (HCC)  Depression with anxiety  Other orders -     Folic Acid ; Take 1 tablet (1 mg total) by mouth daily.  Dispense: 100 tablet; Refill: 3 -     HYDROcodone -Acetaminophen ; Take 1 tablet by mouth every 6 (six) hours as needed for moderate pain (pain score 4-6).  Dispense: 20 tablet; Refill:  0    Assessment and Plan Assessment & Plan Rib and pelvic fractures   Recent rib and pelvic fractures from a fall are under observation. Rib fractures are healing well, while the pelvic fracture requires more time. No surgical intervention is planned. Continue home physical therapy and [facility] Pdmp checked  Hypertension   Blood pressure readings range from 125/41 to 177/98, averaging in the 150s to 160s. Management is challenging due to the risk of hypotension and falls. She is not on antihypertensive medication due to previous hypotensive episodes. Monitor blood pressure trends, especially if readings consistently reach the 160s to 170s. Consider reintroducing amlodipine  2.5 mg if blood pressure remains elevated. Check blood pressure at different times of  the day to assess trends.  Constipation   Constipation is likely secondary to pain medication use. A recent bowel movement was unsatisfactory. Purelax has been started to manage this issue. Continue Purelax, and colace- increasing to two pills at night if needed. Monitor bowel movements and adjust the dosage accordingly.  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 Hoarseness has persisted for six months. There is inconsistent use of mouth rinsing after nebulizer treatments. Ensure mouth rinsing after nebulizer treatments to reduce dysphonia and continue the current COPD management regimen.  Anxiety/depression-chronic.  Stable on current meds.       Return in about 3 months (around 06/01/2024) for chronic follow-up.  Jenkins CHRISTELLA Carrel, MD     [1]  Current Outpatient Medications:    acetaminophen  (TYLENOL ) 500 MG tablet, Take 1 tablet (500 mg total) by mouth 3 (three) times daily., Disp: 30 tablet, Rfl: 0   albuterol  (PROVENTIL ) (2.5 MG/3ML) 0.083% nebulizer solution, Take 3 mLs (2.5 mg total) by nebulization every 6 (six) hours  as needed for wheezing or shortness of breath., Disp: 360 mL, Rfl: 3   ARIPiprazole  (ABILIFY ) 2 MG tablet, TAKE 1 TABLET BY MOUTH EVERY DAY *NEW PRESCRIPTION REQUEST*, Disp: 90 tablet, Rfl: 3   ASPIRIN  LOW DOSE 81 MG tablet, TAKE 1 TABLET BY MOUTH EVERY DAY *NEW PRESCRIPTION REQUEST*, Disp: 90 tablet, Rfl: 11   budesonide  (PULMICORT ) 1 MG/2ML nebulizer solution, INHALE THE CONTENTS OF 1 VIAL VIA NEBULIZER TWICE DAILY *NEW PRESCRIPTION REQUEST*, Disp: 180 mL, Rfl: 11   buPROPion  (WELLBUTRIN  XL) 150 MG 24 hr tablet, Take 1 tablet (150 mg total) by mouth in the morning., Disp: 90 tablet, Rfl: 3   calcium -vitamin D  (OSCAL WITH D) 500-200 MG-UNIT tablet, Take 1 tablet by mouth 3 (three) times daily., Disp: 90 tablet, Rfl: 6   cephALEXin  (KEFLEX ) 250 MG capsule, Take 1 capsule (250 mg total) by mouth daily. Resume after finished with antibiotics from hospitalization, Disp: , Rfl:    ciprofloxacin  (CIPRO ) 500 MG tablet, Take 1 tablet (500 mg total) by mouth 2 (two) times daily., Disp: 8 tablet, Rfl: 0   cyanocobalamin  (VITAMIN B12) 1000 MCG/ML injection, Inject 1 mL (1,000 mcg total) into the muscle every 30 (thirty) days., Disp: 3 mL, Rfl: 3   diazepam  (VALIUM ) 5 MG tablet, Take 1 tablet (5 mg total) by mouth See admin instructions. Take 5 mg by mouth in the evening AS NEEDED for restlessness and an additional 5 mg once a day as needed for anxiety, Disp: 100 tablet, Rfl: 1   docusate sodium  (COLACE) 100 MG capsule, Take 1 capsule (100 mg total) by mouth daily., Disp: 90 capsule, Rfl: 11   estradiol (ESTRACE) 0.1 MG/GM vaginal cream, Place 1 Applicatorful vaginally daily as needed (AS DIRECTED)., Disp: , Rfl:    guaiFENesin  (MUCINEX ) 600 MG 12 hr tablet, Take 600 mg by mouth 2 (two) times daily as needed for cough or to loosen phlegm., Disp: , Rfl:    memantine  (NAMENDA ) 5 MG tablet, Take 1 tablet (5 mg total) by mouth in the morning and at bedtime., Disp: 180 tablet, Rfl: 3   omega-3 acid ethyl esters  (LOVAZA ) 1 g capsule, Take 2 capsules (2 g total) by mouth 2 (two) times daily., Disp: 360 capsule, Rfl: 3   omeprazole  (PRILOSEC) 40 MG capsule, TAKE ONE (1) CAPSULE BY MOUTH TWICE DAILY *NEW PRESCRIPTION REQUEST*, Disp: 180 capsule, Rfl: 3   ondansetron  (ZOFRAN -ODT) 8  MG disintegrating tablet, Take 1 tablet (8 mg total) by mouth every 8 (eight) hours as needed for nausea or vomiting. PLACE 1 TABLET ON TONGUE EVERY 8 HOURS AS NEEDED FOR NAUSEA OR VOMITING, Disp: 100 tablet, Rfl: 1   polyethylene glycol (MIRALAX  / GLYCOLAX ) 17 g packet, Take 17 g by mouth daily., Disp: 100 each, Rfl: 3   predniSONE  (DELTASONE ) 10 MG tablet, Take 6 tablets (60 mg total) by mouth daily with breakfast. And decrease by one tablet daily, Disp: 21 tablet, Rfl: 0   predniSONE  (DELTASONE ) 5 MG tablet, Take 1 tablet (5 mg total) by mouth at bedtime. Resume after finished with prednisone  taper from hospitalization, Disp: , Rfl:    Probiotic CHEW, Chew 1 each by mouth every other day., Disp: 100 tablet, Rfl: 3   rOPINIRole  (REQUIP ) 1 MG tablet, Take 1 tablet (1 mg total) by mouth as directed. TAKE 1 TABLET IN THE MORNING AND 2 TABLETS IN THE EVENING, Disp: 270 tablet, Rfl: 3   senna-docusate (SENOKOT-S) 8.6-50 MG tablet, Take 1 tablet by mouth 2 (two) times daily., Disp: 30 tablet, Rfl: 0   sertraline  (ZOLOFT ) 50 MG tablet, Take 1 tablet (50 mg total) by mouth in the morning., Disp: 90 tablet, Rfl: 3   Syringe/Needle, Disp, (SYRINGE 3CC/25GX1) 25G X 1 3 ML MISC, Use to give IM B12 injection, Disp: 16 each, Rfl: 0   SYSTANE 0.4-0.3 % SOLN, Place 1 drop into both eyes 4 (four) times daily as needed (for dryness)., Disp: 30 mL, Rfl: 3   Tiotropium Bromide  (SPIRIVA  RESPIMAT) 2.5 MCG/ACT AERS, Inhale 2 Inhalations into the lungs daily at 12 noon., Disp: 12 g, Rfl: 3   tocilizumab  4 mg/kg in sodium chloride  0.9 %, Inject 280 mg into the vein every 28 (twenty-eight) days., Disp: , Rfl:    folic acid  (FOLVITE ) 1 MG tablet, Take 1  tablet (1 mg total) by mouth daily., Disp: 100 tablet, Rfl: 3   HYDROcodone -acetaminophen  (NORCO/VICODIN) 5-325 MG tablet, Take 1 tablet by mouth every 6 (six) hours as needed for moderate pain (pain score 4-6)., Disp: 20 tablet, Rfl: 0 [2]  Allergies Allergen Reactions   Oxycodone  Itching and Other (See Comments)    Hallucinations   Statins Other (See Comments)    Myalgia    Advair Diskus [Fluticasone -Salmeterol] Other (See Comments)    Mouth sores   Atorvastatin  Other (See Comments)    Severe headache   Sulfonamide Derivatives Rash

## 2024-03-05 ENCOUNTER — Telehealth: Payer: Self-pay

## 2024-03-05 NOTE — Telephone Encounter (Signed)
 Called and gave ok  Copied from CRM [id number]. Topic: Clinical - Home Health Verbal Orders >> Mar 05, 2024  8:38 AM Burnard DEL wrote: Caller/Agency: [facility] Callback Number: [phone] Service Requested: Skilled Nursing-fracture Frequency: 1wk 3 Every other week x 3 Any new concerns about the patient? No

## 2024-03-22 ENCOUNTER — Inpatient Hospital Stay (HOSPITAL_COMMUNITY)
Admission: EM | Admit: 2024-03-22 | Discharge: 2024-03-29 | DRG: 291 | Disposition: A | Discharge status: Home / Self Care | Attending: Internal Medicine | Admitting: Internal Medicine

## 2024-03-22 ENCOUNTER — Ambulatory Visit: Payer: Self-pay

## 2024-03-22 ENCOUNTER — Emergency Department (HOSPITAL_COMMUNITY)

## 2024-03-22 DIAGNOSIS — D631 Anemia in chronic kidney disease: Secondary | ICD-10-CM | POA: Diagnosis present

## 2024-03-22 DIAGNOSIS — M316 Other giant cell arteritis: Secondary | ICD-10-CM | POA: Diagnosis present

## 2024-03-22 DIAGNOSIS — F03A3 Unspecified dementia, mild, with mood disturbance: Secondary | ICD-10-CM | POA: Diagnosis present

## 2024-03-22 DIAGNOSIS — I080 Rheumatic disorders of both mitral and aortic valves: Secondary | ICD-10-CM | POA: Diagnosis present

## 2024-03-22 DIAGNOSIS — G2581 Restless legs syndrome: Secondary | ICD-10-CM | POA: Diagnosis present

## 2024-03-22 DIAGNOSIS — I447 Left bundle-branch block, unspecified: Secondary | ICD-10-CM | POA: Diagnosis present

## 2024-03-22 DIAGNOSIS — N179 Acute kidney failure, unspecified: Secondary | ICD-10-CM | POA: Diagnosis not present

## 2024-03-22 DIAGNOSIS — Z7951 Long term (current) use of inhaled steroids: Secondary | ICD-10-CM

## 2024-03-22 DIAGNOSIS — Z87440 Personal history of urinary (tract) infections: Secondary | ICD-10-CM

## 2024-03-22 DIAGNOSIS — C787 Secondary malignant neoplasm of liver and intrahepatic bile duct: Secondary | ICD-10-CM | POA: Diagnosis present

## 2024-03-22 DIAGNOSIS — Z888 Allergy status to other drugs, medicaments and biological substances status: Secondary | ICD-10-CM

## 2024-03-22 DIAGNOSIS — M797 Fibromyalgia: Secondary | ICD-10-CM | POA: Diagnosis present

## 2024-03-22 DIAGNOSIS — Z833 Family history of diabetes mellitus: Secondary | ICD-10-CM

## 2024-03-22 DIAGNOSIS — I5041 Acute combined systolic (congestive) and diastolic (congestive) heart failure: Secondary | ICD-10-CM | POA: Diagnosis not present

## 2024-03-22 DIAGNOSIS — N1831 Chronic kidney disease, stage 3a: Secondary | ICD-10-CM | POA: Diagnosis present

## 2024-03-22 DIAGNOSIS — R296 Repeated falls: Secondary | ICD-10-CM | POA: Diagnosis present

## 2024-03-22 DIAGNOSIS — J90 Pleural effusion, not elsewhere classified: Secondary | ICD-10-CM | POA: Diagnosis present

## 2024-03-22 DIAGNOSIS — J4541 Moderate persistent asthma with (acute) exacerbation: Secondary | ICD-10-CM | POA: Diagnosis present

## 2024-03-22 DIAGNOSIS — F32A Depression, unspecified: Secondary | ICD-10-CM | POA: Diagnosis present

## 2024-03-22 DIAGNOSIS — E66811 Obesity, class 1: Secondary | ICD-10-CM | POA: Diagnosis present

## 2024-03-22 DIAGNOSIS — E781 Pure hyperglyceridemia: Secondary | ICD-10-CM | POA: Diagnosis present

## 2024-03-22 DIAGNOSIS — J9621 Acute and chronic respiratory failure with hypoxia: Secondary | ICD-10-CM | POA: Diagnosis present

## 2024-03-22 DIAGNOSIS — K760 Fatty (change of) liver, not elsewhere classified: Secondary | ICD-10-CM | POA: Diagnosis present

## 2024-03-22 DIAGNOSIS — Z825 Family history of asthma and other chronic lower respiratory diseases: Secondary | ICD-10-CM

## 2024-03-22 DIAGNOSIS — Z6830 Body mass index (BMI) 30.0-30.9, adult: Secondary | ICD-10-CM

## 2024-03-22 DIAGNOSIS — D849 Immunodeficiency, unspecified: Secondary | ICD-10-CM | POA: Diagnosis present

## 2024-03-22 DIAGNOSIS — Z801 Family history of malignant neoplasm of trachea, bronchus and lung: Secondary | ICD-10-CM

## 2024-03-22 DIAGNOSIS — J189 Pneumonia, unspecified organism: Secondary | ICD-10-CM | POA: Diagnosis present

## 2024-03-22 DIAGNOSIS — M315 Giant cell arteritis with polymyalgia rheumatica: Secondary | ICD-10-CM | POA: Diagnosis present

## 2024-03-22 DIAGNOSIS — M810 Age-related osteoporosis without current pathological fracture: Secondary | ICD-10-CM | POA: Diagnosis present

## 2024-03-22 DIAGNOSIS — R0602 Shortness of breath: Secondary | ICD-10-CM | POA: Diagnosis present

## 2024-03-22 DIAGNOSIS — I5021 Acute systolic (congestive) heart failure: Secondary | ICD-10-CM | POA: Diagnosis present

## 2024-03-22 DIAGNOSIS — F03A4 Unspecified dementia, mild, with anxiety: Secondary | ICD-10-CM | POA: Diagnosis present

## 2024-03-22 DIAGNOSIS — Z8261 Family history of arthritis: Secondary | ICD-10-CM

## 2024-03-22 DIAGNOSIS — J4540 Moderate persistent asthma, uncomplicated: Secondary | ICD-10-CM | POA: Diagnosis present

## 2024-03-22 DIAGNOSIS — Z79899 Other long term (current) drug therapy: Secondary | ICD-10-CM

## 2024-03-22 DIAGNOSIS — K219 Gastro-esophageal reflux disease without esophagitis: Secondary | ICD-10-CM | POA: Diagnosis present

## 2024-03-22 DIAGNOSIS — F418 Other specified anxiety disorders: Secondary | ICD-10-CM | POA: Diagnosis present

## 2024-03-22 DIAGNOSIS — Z1152 Encounter for screening for COVID-19: Secondary | ICD-10-CM

## 2024-03-22 DIAGNOSIS — Z66 Do not resuscitate: Secondary | ICD-10-CM | POA: Diagnosis present

## 2024-03-22 DIAGNOSIS — Z90710 Acquired absence of both cervix and uterus: Secondary | ICD-10-CM

## 2024-03-22 DIAGNOSIS — K5909 Other constipation: Secondary | ICD-10-CM | POA: Diagnosis present

## 2024-03-22 DIAGNOSIS — H9190 Unspecified hearing loss, unspecified ear: Secondary | ICD-10-CM | POA: Diagnosis present

## 2024-03-22 DIAGNOSIS — E890 Postprocedural hypothyroidism: Secondary | ICD-10-CM | POA: Diagnosis present

## 2024-03-22 DIAGNOSIS — I5043 Acute on chronic combined systolic (congestive) and diastolic (congestive) heart failure: Secondary | ICD-10-CM

## 2024-03-22 DIAGNOSIS — Z885 Allergy status to narcotic agent status: Secondary | ICD-10-CM

## 2024-03-22 DIAGNOSIS — I130 Hypertensive heart and chronic kidney disease with heart failure and stage 1 through stage 4 chronic kidney disease, or unspecified chronic kidney disease: Principal | ICD-10-CM | POA: Diagnosis present

## 2024-03-22 DIAGNOSIS — J811 Chronic pulmonary edema: Secondary | ICD-10-CM | POA: Diagnosis present

## 2024-03-22 DIAGNOSIS — E538 Deficiency of other specified B group vitamins: Secondary | ICD-10-CM | POA: Diagnosis present

## 2024-03-22 DIAGNOSIS — I509 Heart failure, unspecified: Principal | ICD-10-CM

## 2024-03-22 DIAGNOSIS — Z604 Social exclusion and rejection: Secondary | ICD-10-CM | POA: Diagnosis present

## 2024-03-22 DIAGNOSIS — Z634 Disappearance and death of family member: Secondary | ICD-10-CM

## 2024-03-22 DIAGNOSIS — Z7982 Long term (current) use of aspirin: Secondary | ICD-10-CM

## 2024-03-22 DIAGNOSIS — Z8711 Personal history of peptic ulcer disease: Secondary | ICD-10-CM

## 2024-03-22 DIAGNOSIS — I10 Essential (primary) hypertension: Secondary | ICD-10-CM | POA: Diagnosis present

## 2024-03-22 DIAGNOSIS — J0100 Acute maxillary sinusitis, unspecified: Secondary | ICD-10-CM | POA: Diagnosis present

## 2024-03-22 DIAGNOSIS — Z7952 Long term (current) use of systemic steroids: Secondary | ICD-10-CM

## 2024-03-22 DIAGNOSIS — Z85828 Personal history of other malignant neoplasm of skin: Secondary | ICD-10-CM

## 2024-03-22 DIAGNOSIS — R918 Other nonspecific abnormal finding of lung field: Secondary | ICD-10-CM

## 2024-03-22 DIAGNOSIS — Z882 Allergy status to sulfonamides status: Secondary | ICD-10-CM

## 2024-03-22 DIAGNOSIS — Z823 Family history of stroke: Secondary | ICD-10-CM

## 2024-03-22 DIAGNOSIS — L8990 Pressure ulcer of unspecified site, unspecified stage: Secondary | ICD-10-CM | POA: Insufficient documentation

## 2024-03-22 DIAGNOSIS — Z8249 Family history of ischemic heart disease and other diseases of the circulatory system: Secondary | ICD-10-CM

## 2024-03-22 DIAGNOSIS — Z860101 Personal history of adenomatous and serrated colon polyps: Secondary | ICD-10-CM

## 2024-03-22 LAB — CBC
HCT: 31.1 % — ABNORMAL LOW (ref 36.0–46.0)
Hemoglobin: 9.4 g/dL — ABNORMAL LOW (ref 12.0–15.0)
MCH: 29.7 pg (ref 26.0–34.0)
MCHC: 30.2 g/dL (ref 30.0–36.0)
MCV: 98.1 fL (ref 80.0–100.0)
Platelets: 278 K/uL (ref 150–400)
RBC: 3.17 MIL/uL — ABNORMAL LOW (ref 3.87–5.11)
RDW: 14.6 % (ref 11.5–15.5)
WBC: 8.9 K/uL (ref 4.0–10.5)
nRBC: 0 % (ref 0.0–0.2)

## 2024-03-22 LAB — PRO BRAIN NATRIURETIC PEPTIDE: Pro Brain Natriuretic Peptide: 23103 pg/mL — ABNORMAL HIGH

## 2024-03-22 LAB — TROPONIN T, HIGH SENSITIVITY
Troponin T High Sensitivity: 96 ng/L — ABNORMAL HIGH (ref 0–19)
Troponin T High Sensitivity: 85 ng/L — ABNORMAL HIGH (ref 0–19)

## 2024-03-22 LAB — BASIC METABOLIC PANEL WITH GFR
Anion gap: 9 (ref 5–15)
BUN: 22 mg/dL (ref 8–23)
CO2: 28 mmol/L (ref 22–32)
Calcium: 9.4 mg/dL (ref 8.9–10.3)
Chloride: 105 mmol/L (ref 98–111)
Creatinine, Ser: 0.95 mg/dL (ref 0.44–1.00)
GFR, Estimated: 56 mL/min — ABNORMAL LOW
Glucose, Bld: 105 mg/dL — ABNORMAL HIGH (ref 70–99)
Potassium: 4.5 mmol/L (ref 3.5–5.1)
Sodium: 142 mmol/L (ref 135–145)

## 2024-03-22 LAB — RESP PANEL BY RT-PCR (RSV, FLU A&B, COVID)  RVPGX2
Influenza A by PCR: NEGATIVE
Influenza B by PCR: NEGATIVE
Resp Syncytial Virus by PCR: NEGATIVE
SARS Coronavirus 2 by RT PCR: NEGATIVE

## 2024-03-22 MED ORDER — SERTRALINE HCL 50 MG PO TABS
50.0000 mg | ORAL_TABLET | Freq: Every morning | ORAL | Status: DC
  Administered 2024-03-23 – 2024-03-29 (×7): 50 mg via ORAL
  Filled 2024-03-22 (×7): qty 1

## 2024-03-22 MED ORDER — ENOXAPARIN SODIUM 40 MG/0.4ML IJ SOSY
40.0000 mg | PREFILLED_SYRINGE | INTRAMUSCULAR | Status: DC
  Administered 2024-03-22 – 2024-03-23 (×2): 40 mg via SUBCUTANEOUS
  Filled 2024-03-22 (×2): qty 0.4

## 2024-03-22 MED ORDER — SODIUM CHLORIDE 0.9 % IV SOLN: 500.0000 mg | INTRAVENOUS | Status: DC

## 2024-03-22 MED ORDER — SODIUM CHLORIDE 0.9 % IV SOLN
500.0000 mg | Freq: Once | INTRAVENOUS | Status: AC
  Administered 2024-03-22: 500 mg via INTRAVENOUS
  Filled 2024-03-22: qty 5

## 2024-03-22 MED ORDER — BISACODYL 5 MG PO TBEC
5.0000 mg | DELAYED_RELEASE_TABLET | Freq: Every day | ORAL | Status: DC | PRN
  Administered 2024-03-27: 5 mg via ORAL
  Filled 2024-03-22: qty 1

## 2024-03-22 MED ORDER — ARFORMOTEROL TARTRATE 15 MCG/2ML IN NEBU
15.0000 ug | INHALATION_SOLUTION | Freq: Two times a day (BID) | RESPIRATORY_TRACT | Status: DC
  Administered 2024-03-22 – 2024-03-29 (×14): 15 ug via RESPIRATORY_TRACT
  Filled 2024-03-22 (×14): qty 2

## 2024-03-22 MED ORDER — DIAZEPAM 5 MG PO TABS
5.0000 mg | ORAL_TABLET | Freq: Every evening | ORAL | Status: DC | PRN
  Administered 2024-03-26: 5 mg via ORAL
  Filled 2024-03-22: qty 1

## 2024-03-22 MED ORDER — METHYLPREDNISOLONE SODIUM SUCC 125 MG IJ SOLR
125.0000 mg | Freq: Once | INTRAMUSCULAR | Status: AC
  Administered 2024-03-22: 125 mg via INTRAVENOUS
  Filled 2024-03-22: qty 2

## 2024-03-22 MED ORDER — ROPINIROLE HCL 1 MG PO TABS
1.0000 mg | ORAL_TABLET | Freq: Every day | ORAL | Status: DC
  Administered 2024-03-23 – 2024-03-29 (×7): 1 mg via ORAL
  Filled 2024-03-22 (×7): qty 1

## 2024-03-22 MED ORDER — FUROSEMIDE 10 MG/ML IJ SOLN: 40.0000 mg | Freq: Two times a day (BID) | INTRAMUSCULAR | Status: DC

## 2024-03-22 MED ORDER — ACETAMINOPHEN 325 MG PO TABS: 650.0000 mg | ORAL_TABLET | Freq: Four times a day (QID) | ORAL | Status: DC | PRN

## 2024-03-22 MED ORDER — ONDANSETRON HCL 4 MG PO TABS: 4.0000 mg | ORAL_TABLET | Freq: Four times a day (QID) | ORAL | Status: DC | PRN

## 2024-03-22 MED ORDER — DIAZEPAM 5 MG PO TABS: 5.0000 mg | ORAL_TABLET | Freq: Every day | ORAL | Status: DC | PRN

## 2024-03-22 MED ORDER — SODIUM CHLORIDE 0.9% FLUSH
3.0000 mL | Freq: Two times a day (BID) | INTRAVENOUS | Status: DC
  Administered 2024-03-22 – 2024-03-29 (×14): 3 mL via INTRAVENOUS

## 2024-03-22 MED ORDER — ROPINIROLE HCL 1 MG PO TABS
2.0000 mg | ORAL_TABLET | Freq: Every day | ORAL | Status: DC
  Administered 2024-03-22 – 2024-03-28 (×7): 2 mg via ORAL
  Filled 2024-03-22 (×7): qty 2

## 2024-03-22 MED ORDER — ACETAMINOPHEN 650 MG RE SUPP: 650.0000 mg | Freq: Four times a day (QID) | RECTAL | Status: DC | PRN

## 2024-03-22 MED ORDER — FUROSEMIDE 10 MG/ML IJ SOLN
40.0000 mg | Freq: Two times a day (BID) | INTRAMUSCULAR | Status: DC
  Administered 2024-03-23 – 2024-03-24 (×3): 40 mg via INTRAVENOUS
  Filled 2024-03-22 (×3): qty 4

## 2024-03-22 MED ORDER — IPRATROPIUM-ALBUTEROL 0.5-2.5 (3) MG/3ML IN SOLN
3.0000 mL | Freq: Once | RESPIRATORY_TRACT | Status: AC
  Administered 2024-03-22: 3 mL via RESPIRATORY_TRACT
  Filled 2024-03-22: qty 3

## 2024-03-22 MED ORDER — ARIPIPRAZOLE 2 MG PO TABS
2.0000 mg | ORAL_TABLET | Freq: Every day | ORAL | Status: DC
  Administered 2024-03-23 – 2024-03-29 (×7): 2 mg via ORAL
  Filled 2024-03-22 (×7): qty 1

## 2024-03-22 MED ORDER — SENNOSIDES-DOCUSATE SODIUM 8.6-50 MG PO TABS: 1.0000 | ORAL_TABLET | Freq: Every evening | ORAL | Status: DC | PRN

## 2024-03-22 MED ORDER — ONDANSETRON HCL 4 MG/2ML IJ SOLN: 4.0000 mg | Freq: Four times a day (QID) | INTRAMUSCULAR | Status: DC | PRN

## 2024-03-22 MED ORDER — FUROSEMIDE 10 MG/ML IJ SOLN
40.0000 mg | Freq: Once | INTRAMUSCULAR | Status: AC
  Administered 2024-03-22: 40 mg via INTRAVENOUS
  Filled 2024-03-22: qty 4

## 2024-03-22 MED ORDER — GUAIFENESIN ER 600 MG PO TB12
600.0000 mg | ORAL_TABLET | Freq: Two times a day (BID) | ORAL | Status: DC
  Administered 2024-03-22 – 2024-03-29 (×14): 600 mg via ORAL
  Filled 2024-03-22 (×14): qty 1

## 2024-03-22 MED ORDER — BUPROPION HCL ER (XL) 150 MG PO TB24
150.0000 mg | ORAL_TABLET | Freq: Every morning | ORAL | Status: DC
  Administered 2024-03-23 – 2024-03-29 (×7): 150 mg via ORAL
  Filled 2024-03-22 (×7): qty 1

## 2024-03-22 MED ORDER — SODIUM CHLORIDE 0.9 % IV SOLN
2.0000 g | Freq: Once | INTRAVENOUS | Status: AC
  Administered 2024-03-22: 2 g via INTRAVENOUS
  Filled 2024-03-22: qty 20

## 2024-03-22 MED ORDER — IPRATROPIUM-ALBUTEROL 0.5-2.5 (3) MG/3ML IN SOLN: 3.0000 mL | Freq: Four times a day (QID) | RESPIRATORY_TRACT | Status: DC | PRN

## 2024-03-22 MED ORDER — TIOTROPIUM BROMIDE 2.5 MCG/ACT IN AERS: 2.0000 | INHALATION_SPRAY | Freq: Every day | RESPIRATORY_TRACT | Status: DC

## 2024-03-22 MED ORDER — UMECLIDINIUM BROMIDE 62.5 MCG/ACT IN AEPB
1.0000 | INHALATION_SPRAY | Freq: Every day | RESPIRATORY_TRACT | Status: DC
  Filled 2024-03-22: qty 7

## 2024-03-22 MED ORDER — SODIUM CHLORIDE 0.9 % IV SOLN
2.0000 g | INTRAVENOUS | Status: AC
  Administered 2024-03-23 – 2024-03-26 (×4): 2 g via INTRAVENOUS
  Filled 2024-03-22 (×4): qty 20

## 2024-03-22 MED ORDER — PREDNISONE 5 MG PO TABS
5.0000 mg | ORAL_TABLET | Freq: Every day | ORAL | Status: DC
  Administered 2024-03-22: 5 mg via ORAL
  Filled 2024-03-22: qty 1

## 2024-03-22 MED ORDER — BUDESONIDE 0.25 MG/2ML IN SUSP
0.2500 mg | Freq: Two times a day (BID) | RESPIRATORY_TRACT | Status: DC
  Administered 2024-03-22: 0.25 mg via RESPIRATORY_TRACT
  Filled 2024-03-22: qty 2

## 2024-03-22 MED ORDER — MEMANTINE HCL 10 MG PO TABS
5.0000 mg | ORAL_TABLET | Freq: Two times a day (BID) | ORAL | Status: DC
  Administered 2024-03-22 – 2024-03-29 (×14): 5 mg via ORAL
  Filled 2024-03-22 (×14): qty 1

## 2024-03-22 NOTE — ED Provider Notes (Signed)
 " [place] EMERGENCY DEPARTMENT AT [facility] Provider Note   CSN: [id number] Arrival date & time: 03/22/24  1504     Patient presents with: Cough and Shortness of Breath   [name] is a 89 y.o. female.   -year-old female with past medical history of asthma on 2 L nasal cannula at baseline presenting to the emergency department today with cough and shortness of breath.  The patient has been feeling well now over the past few days.  She has had some chills but no fevers.  She had a coughing fit this morning and was very short of breath.  Family went up to 3 L nasal cannula oxygen .  When medics arrived to her house patient's pulse ox was 77% on room air.  She was increased to 4 L with improvement.  She was brought to the ER for further evaluation.  Patient denies any chest pain.   Cough Associated symptoms: shortness of breath   Shortness of Breath Associated symptoms: cough        Prior to Admission medications  Medication Sig Start Date End Date Taking? Authorizing Provider  acetaminophen  (TYLENOL ) 500 MG tablet Take 1 tablet (500 mg total) by mouth 3 (three) times daily. 01/03/24   Regalado, Belkys A, MD  albuterol  (PROVENTIL ) (2.5 MG/3ML) 0.083% nebulizer solution Take 3 mLs (2.5 mg total) by nebulization every 6 (six) hours as needed for wheezing or shortness of breath. 01/07/24   Wendolyn Jenkins Jansky, MD  ARIPiprazole  (ABILIFY ) 2 MG tablet TAKE 1 TABLET BY MOUTH EVERY DAY *NEW PRESCRIPTION REQUEST* 01/07/24   Wendolyn Jenkins Jansky, MD  ASPIRIN  LOW DOSE 81 MG tablet TAKE 1 TABLET BY MOUTH EVERY DAY *NEW PRESCRIPTION REQUEST* 01/07/24   Wendolyn Jenkins Jansky, MD  budesonide  (PULMICORT ) 1 MG/2ML nebulizer solution INHALE THE CONTENTS OF 1 VIAL VIA NEBULIZER TWICE DAILY *NEW PRESCRIPTION REQUEST* 01/07/24   Wendolyn Jenkins Jansky, MD  buPROPion  (WELLBUTRIN  XL) 150 MG 24 hr tablet Take 1 tablet (150 mg total) by mouth in the morning. 01/07/24   Wendolyn Jenkins Jansky, MD  calcium -vitamin D   (OSCAL WITH D) 500-200 MG-UNIT tablet Take 1 tablet by mouth 3 (three) times daily. 01/11/19   Jerri Kay HERO, MD  cephALEXin  (KEFLEX ) 250 MG capsule Take 1 capsule (250 mg total) by mouth daily. Resume after finished with antibiotics from hospitalization 02/28/24   Tat, Alm, MD  ciprofloxacin  (CIPRO ) 500 MG tablet Take 1 tablet (500 mg total) by mouth 2 (two) times daily. 02/28/24   Evonnie Alm, MD  cyanocobalamin  (VITAMIN B12) 1000 MCG/ML injection Inject 1 mL (1,000 mcg total) into the muscle every 30 (thirty) days. 01/07/24   Wendolyn Jenkins Jansky, MD  diazepam  (VALIUM ) 5 MG tablet Take 1 tablet (5 mg total) by mouth See admin instructions. Take 5 mg by mouth in the evening AS NEEDED for restlessness and an additional 5 mg once a day as needed for anxiety 01/13/24   Wendolyn Jenkins Jansky, MD  docusate sodium  (COLACE) 100 MG capsule Take 1 capsule (100 mg total) by mouth daily. 01/07/24   Wendolyn Jenkins Jansky, MD  estradiol (ESTRACE) 0.1 MG/GM vaginal cream Place 1 Applicatorful vaginally daily as needed (AS DIRECTED). 03/30/21   [provider]  folic acid  (FOLVITE ) 1 MG tablet Take 1 tablet (1 mg total) by mouth daily. 03/03/24   Wendolyn Jenkins Jansky, MD  guaiFENesin  (MUCINEX ) 600 MG 12 hr tablet Take 600 mg by mouth 2 (two) times daily as needed for cough or to  loosen phlegm.    [provider]  HYDROcodone -acetaminophen  (NORCO/VICODIN) 5-325 MG tablet Take 1 tablet by mouth every 6 (six) hours as needed for moderate pain (pain score 4-6). 03/03/24   Wendolyn Jenkins Jansky, MD  memantine  (NAMENDA ) 5 MG tablet Take 1 tablet (5 mg total) by mouth in the morning and at bedtime. 01/07/24   Wendolyn Jenkins Jansky, MD  omega-3 acid ethyl esters (LOVAZA ) 1 g capsule Take 2 capsules (2 g total) by mouth 2 (two) times daily. 01/22/24   Wendolyn Jenkins Jansky, MD  omeprazole  (PRILOSEC) 40 MG capsule TAKE ONE (1) CAPSULE BY MOUTH TWICE DAILY *NEW PRESCRIPTION REQUEST* 01/07/24   Wendolyn Jenkins Jansky, MD  ondansetron  (ZOFRAN -ODT) 8  MG disintegrating tablet Take 1 tablet (8 mg total) by mouth every 8 (eight) hours as needed for nausea or vomiting. PLACE 1 TABLET ON TONGUE EVERY 8 HOURS AS NEEDED FOR NAUSEA OR VOMITING 01/13/24   Wendolyn Jenkins Jansky, MD  polyethylene glycol (MIRALAX  / GLYCOLAX ) 17 g packet Take 17 g by mouth daily. 01/13/24   Wendolyn Jenkins Jansky, MD  predniSONE  (DELTASONE ) 10 MG tablet Take 6 tablets (60 mg total) by mouth daily with breakfast. And decrease by one tablet daily 02/29/24   Tat, Alm, MD  predniSONE  (DELTASONE ) 5 MG tablet Take 1 tablet (5 mg total) by mouth at bedtime. Resume after finished with prednisone  taper from hospitalization 02/28/24   Evonnie Alm, MD  Probiotic CHEW Chew 1 each by mouth every other day. 01/07/24   Wendolyn Jenkins Jansky, MD  rOPINIRole  (REQUIP ) 1 MG tablet Take 1 tablet (1 mg total) by mouth as directed. TAKE 1 TABLET IN THE MORNING AND 2 TABLETS IN THE EVENING 01/22/24   Wendolyn Jenkins Jansky, MD  senna-docusate (SENOKOT-S) 8.6-50 MG tablet Take 1 tablet by mouth 2 (two) times daily. 01/03/24   Regalado, Belkys A, MD  sertraline  (ZOLOFT ) 50 MG tablet Take 1 tablet (50 mg total) by mouth in the morning. 01/07/24   Wendolyn Jenkins Jansky, MD  Syringe/Needle, Disp, (SYRINGE 3CC/25GX1) 25G X 1 3 ML MISC Use to give IM B12 injection 01/13/24   Wendolyn Jenkins Jansky, MD  SYSTANE 0.4-0.3 % SOLN Place 1 drop into both eyes 4 (four) times daily as needed (for dryness). 01/13/24   Wendolyn Jenkins Jansky, MD  Tiotropium Bromide  (SPIRIVA  RESPIMAT) 2.5 MCG/ACT AERS Inhale 2 Inhalations into the lungs daily at 12 noon. 01/22/24   Wendolyn Jenkins Jansky, MD  tocilizumab  4 mg/kg in sodium chloride  0.9 % Inject 280 mg into the vein every 28 (twenty-eight) days. 12/03/16   [provider]    Allergies: Oxycodone , Statins, Advair diskus [fluticasone -salmeterol], Atorvastatin , and Sulfonamide derivatives    Review of Systems  Respiratory:  Positive for cough and shortness of breath.   All other systems reviewed and are  negative.   Updated Vital Signs BP 134/68   Pulse 85   Temp 99.5 F (37.5 C) (Oral)   Resp 18   SpO2 98%   Physical Exam Vitals and nursing note reviewed.   Gen: Chronically ill appearing, mildly tachypneic, wheezes noted without auscultation Eyes: PERRL, EOMI HEENT: no oropharyngeal swelling Neck: trachea midline Resp: Diffuse wheezes throughout all lung fields Card: RRR, no murmurs, rubs, or gallops Abd: nontender, nondistended Extremities: no calf tenderness, trace edema bilaterally Vascular: 2+ radial pulses bilaterally, 2+ DP pulses bilaterally Skin: no rashes Psyc: acting appropriately   (all labs ordered are listed, but only abnormal results are displayed) Labs Reviewed  BASIC METABOLIC PANEL WITH  GFR - Abnormal; Notable for the following components:      Result Value   Glucose, Bld 105 (*)    GFR, Estimated 56 (*)    All other components within normal limits  PRO BRAIN NATRIURETIC PEPTIDE - Abnormal; Notable for the following components:   Pro Brain Natriuretic Peptide 23,103.0 (*)    All other components within normal limits  CBC - Abnormal; Notable for the following components:   RBC 3.17 (*)    Hemoglobin 9.4 (*)    HCT 31.1 (*)    All other components within normal limits  TROPONIN T, HIGH SENSITIVITY - Abnormal; Notable for the following components:   Troponin T High Sensitivity 96 (*)    All other components within normal limits  RESP PANEL BY RT-PCR (RSV, FLU A&B, COVID)  RVPGX2  TROPONIN T, HIGH SENSITIVITY    EKG: EKG Interpretation Date/Time:  Monday March 22 2024 15:15:33 EST Ventricular Rate:  99 PR Interval:  169 QRS Duration:  137 QT Interval:  378 QTC Calculation: 463 R Axis:   90  Text Interpretation: Sinus or ectopic atrial rhythm Atrial premature complexes LVH with secondary repolarization abnormality Nonspecific ST abnormality Confirmed by Ula Barter [phone]) on 03/22/2024 3:48:29 PM  Radiology: DG Chest 2 View Result Date:  03/22/2024 EXAM: 2 VIEW(S) XRAY OF THE CHEST 03/22/2024 04:55:00 PM COMPARISON: 02/25/2024 CLINICAL HISTORY: SOB FINDINGS: LUNGS AND PLEURA: Mild pulmonary edema. Possible left lower lobe consolidation. Moderate left pleural effusion. No pneumothorax. HEART AND MEDIASTINUM: Cardiomegaly. Calcified aorta. BONES AND SOFT TISSUES: No acute osseous abnormality. IMPRESSION: 1. Mild pulmonary edema. 2. Moderate left pleural effusion. 3. Possible left lower lobe consolidation. 4. Cardiomegaly. Electronically signed by: Greig Pique MD 03/22/2024 05:44 PM EST RP Workstation: [id number]     Procedures   Medications Ordered in the ED  cefTRIAXone  (ROCEPHIN ) 2 g in sodium chloride  0.9 % 100 mL IVPB (has no administration in time range)  azithromycin  (ZITHROMAX ) 500 mg in sodium chloride  0.9 % 250 mL IVPB (has no administration in time range)  furosemide  (LASIX ) injection 40 mg (has no administration in time range)  methylPREDNISolone  sodium succinate (SOLU-MEDROL ) 125 mg/2 mL injection 125 mg (125 mg Intravenous Given 03/22/24 1605)  ipratropium-albuterol  (DUONEB) 0.5-2.5 (3) MG/3ML nebulizer solution 3 mL (3 mLs Nebulization Given 03/22/24 1545)  ipratropium-albuterol  (DUONEB) 0.5-2.5 (3) MG/3ML nebulizer solution 3 mL (3 mLs Nebulization Given 03/22/24 1545)  ipratropium-albuterol  (DUONEB) 0.5-2.5 (3) MG/3ML nebulizer solution 3 mL (3 mLs Nebulization Given 03/22/24 1544)                                    Medical Decision Making 89 year old female with past medical history of asthma, hypertension, and hyperlipidemia on 2 L nasal cannula at baseline presenting to the emergency department today with cough and shortness of breath.  I will further evaluate the patient here with basic labs as well as an EKG, BNP, and chest x-ray to evaluate for pulmonary edema, pulmonary infiltrates, pneumothorax.  Will also obtain an RSV/COVID/flu swab to evaluate for viral etiologies.  I will give patient Solu-Medrol  here as well as  back-to-back DuoNebs.  Will reevaluate for ultimate disposition.  Based on description of her symptoms this seems to be more infectious rather than pulmonary embolism at this time.  I will reevaluate for ultimate disposition.  The patient's labs are significant for elevated BNP and troponin.  Patient is denying any chest pain.  X-ray does show cardiomegaly with pulmonary edema and question infiltrate.  Patient is covered with Rocephin  and azithromycin .  She is given 40 mg of Lasix .  Suspect that the elevated troponin is more due to CHF as opposed to NSTEMI.  A call was placed to the hospitalist service for admission.  CRITICAL CARE Performed by: Prentice JONELLE Medicus   Total critical care time: 35 minutes  Critical care time was exclusive of separately billable procedures and treating other patients.  Critical care was necessary to treat or prevent imminent or life-threatening deterioration.  Critical care was time spent personally by me on the following activities: development of treatment plan with patient and/or surrogate as well as nursing, discussions with consultants, evaluation of patient's response to treatment, examination of patient, obtaining history from patient or surrogate, ordering and performing treatments and interventions, ordering and review of laboratory studies, ordering and review of radiographic studies, pulse oximetry and re-evaluation of patient's condition.   Amount and/or Complexity of Data Reviewed Labs: ordered. Radiology: ordered.  Risk Prescription drug management.        Final diagnoses:  Acute on chronic congestive heart failure, unspecified heart failure type [facility])  Pulmonary infiltrate    ED Discharge Orders     None          Medicus Prentice JONELLE, MD 03/22/24 1755  "

## 2024-03-22 NOTE — Hospital Course (Addendum)
 [name] is a 89 y.o. female with PMH of for asthma, chronic respiratory failure with hypoxia in [address] at baseline, GCA/PMR on Actemra  and chronic prednisone  5 mg daily, CKD stage IIIa, HTN, RLS, B12 deficiency, depression/anxiety, dementia, liver mass/transverse colon thickening (presumed neoplasm/metastatic disease -biopsy not pursued) who is admitted with acute on chronic hypoxic respiratory failure likely secondary to new diagnosis of congestive heart failure after presenting on 03/22/2024 with chronic cough shortness of breath, productive cough-d proBNP 23,103, troponin T 96 >85, WBC 8.9, hemoglobin 9.4, platelets 278, sodium 142, potassium 4.5, bicarb 28, BUN 22, creatinine 0.95, serum glucose 105.  X-ray showed pulmonary edema moderate left pleural effusion possible LLL consolidation.\ 1/6-TTE: EF 35-40%, GHK.  And cardiology was consulted Patient was diuresed, at this time volume status improved, overall doing well Also having cough issues, creatinine has stabilized, she will continue steroid taper follow-up with PCP monitor electrolytes as outpatient and at this time stable for discharge home with home health  Subjective: Seen and examined Alert awake oriented fairly on the bedside chair Overnight afebrile BP stable on 1 L nasal cannula, labs pending -subsequently resulted in improved Eager to go home today Discharge Diagnoses:   Acute systolic CHF-new diagnosis LLL consilidation/acute pulmonary edema/left pleural effusion: Echo-low EF 35-40%, cardiology has been consulted. Son passed away last 05/02/24- ? If stress induced  Managed w/Lasix  IV> now on Oral lasix  40 daily hold today due to bump In creatininie Cont GDMT-  added Coreg , Entresto , Aldactone  1/8>> cardiology s/o.  Continue current GDMT repeat  CXR 1/9:small effusion so will hold off on thoracentesis especially given ongoing coughing spells.  Xr- shows Interval increase in the atelectasis or infiltrate in the right  base-optimize pulmonary status increase pulmonary toileting, cont IS. Net IO Since Admission: -3,124 mL [03/29/24 1041]   Acute on chronic respiratory failure with hypoxia on 2l [place] at baseline: Community-acquired pneumonia Immunocompromise status Asthma/?? Acute asthma exacerbation: continue 02 -currently at home setting Strep pneumoniae/Legionella negative.Patient completed ceftriaxone  azithromycin .  Cont nebs at home.  Hypertension: controlled. Holding home Norvasc . Continue GDMT as above.  Continue aspirin    Giant cell arteritis/polymyalgia rheumatica: Chronically on Prednisone  5 mg,on Actemra  iv q28d- last one in Nov- was held due to being on antibiotics-next appointment in February.   AKI on CKD stage IIIa Creatinine improved this morning 1.1. Peak of 1.4  Recent Labs    02/25/24 1142 02/26/24 0447 03/22/24 1609 03/23/24 0401 03/24/24 0354 03/25/24 0414 03/26/24 0401 03/27/24 0358 03/28/24 0339 03/29/24 0922  BUN 15 18 22  24* 30* 29* 30* 38* 38* 34*  CREATININE 0.82 0.90 0.95 0.92 1.41* 1.00 1.05* 1.04* 1.24* 1.11*  CO2 29 28 28  32 31 39* 35* 33* 34* 32  K 4.4 4.6 4.5 5.0 4.9 4.3 4.3 3.8 4.1 4.2    Normocytic anemia Iron deficiency anemia: no acute blood loss noticed hemoglobin stable, added oral iron supplement. Anemia panel done.  Dementia Anxiety/Depression RLS: Continue home regimen Abilify , bupropion , sertraline , Requip , PRN Valium  along with her Namenda , keep in delirium precaution fall precaution   Liver mass/circumferential wall thickening of the transverse colon: Noted on prior CT imaging of 12/30/2023.Findings on CT concerning for primary neoplasm or metastatic disease. OF NOTE patient and family noted to have deferred workup as she would not want to pursue any oncological treatment.  Class I Obesity w/ Body mass index is 28.59 kg/m.: Will benefit with PCP follow-up, weight loss  Mobility: PT Orders: Active PT Follow up Rec: No Pt Follow Up (  Daughter  Declined Hhpt - Reports She Will Get Her Up And Move Her)03/26/2024 1443    DVT prophylaxis: enoxaparin  (LOVENOX ) injection 30 mg Start: 03/28/24 2000 Code Status:   Code Status: Limited: Do not attempt resuscitation (DNR) -DNR-LIMITED -Do Not Intubate/DNI  Family Communication: plan of care discussed with patient/ daughter at bedside today. Patient status is: Remains hospitalized because of severity of illness Level of care: Telemetry   Dispo: The patient is from: HOME w/ son who passes last saturday            Anticipated disposition: Home  Objective: Vitals last 24 hrs: Vitals:   03/28/24 2029 03/29/24 0456 03/29/24 0500 03/29/24 0814  BP: (!) 127/50 (!) 138/45    Pulse: 60 63    Resp: 18 (!) 21    Temp: 98.3 F (36.8 C) 97.7 F (36.5 C)    TempSrc: Oral Oral    SpO2: 97% 98%  98%  Weight:   73.2 kg   Height:        Physical Examination: General exam: AAO at abselin HEENT:Oral mucosa moist, Ear/Nose WNL grossly Respiratory system: Bilaterally  clear occasional wheezing Cardiovascular system: S1 & S2 +, No JVD. Gastrointestinal system: Abdomen soft,NT,ND, BS+ Nervous System: Alert, awake, moving all extremities,and following commands. Extremities: extremities warm, leg edema mild improving with skin wrinkling Skin: Warm, no rashes MSK: Normal muscle bulk,tone, power

## 2024-03-22 NOTE — Telephone Encounter (Signed)
 FYI Only or Action Required?: FYI only for provider: ED advised.  Patient was last seen in primary care on 03/03/2024 by Sally Jenkins Jansky, MD.  Called Nurse Triage reporting Cough.  Symptoms began several days ago.  Interventions attempted: OTC medications: Mucinex  and Prescription medications: Diazepam .  Symptoms are: gradually worsening.  Triage Disposition: Go to ED Now (Notify PCP)  Patient/caregiver understands and will follow disposition?: Yes    Spoke with pts daughter Donzell (on [place]). Apr 20, 2024 onset of productive cough with yellow mucus, congestion and wheezing. Not coughing up blood. Pts son died of stage 4 cancer on 04-20-2024 as well and wondering if it's related. Wears O2, currently at 3L, normally wears 2L. O2 sat 95%. Temp 98.3 F. Her stomach jerks when the pt tries to take a deep breath. No pain. Moderate SOB. Loud constant wheezing. Triage cut short d/t severity of pt symptoms. Advised ED now, agreeable to go now. Going to [employer]. Advised 911 for worsening symptoms.      Copied from CRM #[id number]. Topic: Clinical - Red Word Triage >> Mar 22, 2024  1:41 PM Carlyon D wrote: Red Word that prompted transfer to Nurse Triage: daughter Donzell on the line states pt has a very bad cough, wheezing, very congested, spitting yellow phlegm. Reason for Disposition  [1] MODERATE difficulty breathing (e.g., speaks in phrases, SOB even at rest, pulse 100-120) AND [2] still present when not coughing  Answer Assessment - Initial Assessment Questions 1. ONSET: When did the cough begin?      April 20, 2024  2. SEVERITY: How bad is the cough today?      *No Answer* 3. SPUTUM: Describe the color of your sputum (e.g., none, dry cough; clear, white, yellow, green)     Yellow 4. HEMOPTYSIS: Are you coughing up any blood? If Yes, ask: How much? (e.g., flecks, streaks, tablespoons, etc.)     Denies 5. DIFFICULTY BREATHING: Are you having difficulty breathing? If Yes, ask: How bad is  it? (e.g., mild, moderate, severe)      Moderate, loud constant wheezing 6. FEVER: Do you have a fever? If Yes, ask: What is your temperature, how was it measured, and when did it start?     Denies. Temp 98.3 F 7. CARDIAC HISTORY: Do you have any history of heart disease? (e.g., heart attack, congestive heart failure)      *No Answer* 8. LUNG HISTORY: Do you have any history of lung disease?  (e.g., pulmonary embolus, asthma, emphysema)     *No Answer* 9. PE RISK FACTORS: Do you have a history of blood clots? (or: recent major surgery, recent prolonged travel, bedridden)     *No Answer* 10. OTHER SYMPTOMS: Do you have any other symptoms? (e.g., runny nose, wheezing, chest pain)       Loud constant wheezing, congestion 11. PREGNANCY: Is there any chance you are pregnant? When was your last menstrual period?       *No Answer* 12. TRAVEL: Have you traveled out of the country in the last month? (e.g., travel history, exposures)       *No Answer*  Protocols used: Cough - Acute Productive-A-AH

## 2024-03-22 NOTE — H&P (Signed)
 " History and Physical    [name] FMW:[id number] DOB: [birth year] DOA: 03/22/2024  PCP: Wendolyn Jenkins Jansky, MD  Patient coming from: Home  I have personally briefly reviewed patient's old medical records in [facility]  Chief Complaint: Shortness of breath  HPI: [name] is a 89 y.o. female with medical history significant for asthma, chronic respiratory failure with hypoxia in [address] [place] at baseline, GCA/PMR on Actemra  and chronic prednisone  5 mg daily, CKD stage IIIa, HTN, RLS, B12 deficiency, depression/anxiety, dementia, liver mass/transverse colon thickening (presumed neoplasm/metastatic disease -biopsy not pursued) who presented to the ED for evaluation of shortness of breath.  Patient presenting with 1 week of shortness of breath and cough mostly productive of frothy white sputum.  Daughter reports some yellow tinge in the sputum today.  Daughter also notes new swelling to both lower extremities which she noticed today.  She denies chest pain, nausea, vomiting, abdominal pain, dysuria.  Patient was recently admitted from 02/25/2024-02/28/2024 for acute asthma exacerbation, left pubic bone fracture, UTI, and left lower lobe opacity treated as pneumonia.  ED Course  Labs/Imaging on admission: I have personally reviewed following labs and imaging studies.  Initial vitals showed BP 155/78, pulse 108, RR 25, temp 99.5 F, SpO2 95% on 4 L supplement O2 via [place].  Per ED triage documentation SpO2 was 77% on room air at 88% on home 2 L O2 via [place].  Labs showed proBNP 23,103, troponin T 96 >85, WBC 8.9, hemoglobin 9.4, platelets 278, sodium 142, potassium 4.5, bicarb 28, BUN 22, creatinine 0.95, serum glucose 105.  SARS-CoV-2, influenza, RSV PCR negative.  2 view chest x-ray showed mild pulmonary edema, moderate left pleural effusion, possible left lower lobe consolidation, cardiomegaly.  Patient was given IV ceftriaxone  and azithromycin , IV Lasix  40 mg, IV Solu-Medrol  125 mg, DuoNeb  treatment x 3.  The hospitalist service was consulted for admission.  Review of Systems: All systems reviewed and are negative except as documented in history of present illness above.   Past Medical History:  Diagnosis Date   Abdominal bloating    Abnormal LFTs (liver function tests)    Ankle fracture, bimalleolar, closed, right, initial encounter    Anxiety disorder    Arthritis    Asthma    Bimalleolar ankle fracture, right, closed, initial encounter 01/06/2019   Depression    Diverticulosis    Edema    Fatty liver    Fibromyalgia    GERD (gastroesophageal reflux disease)    Giant cell arteritis (HCC)    No biopsy secondary to steroid use   Hx of adenomatous colonic polyps    Hyperlipidemia    Hypertension    IBS (irritable bowel syndrome)    Lumbar radiculitis    Osteoporosis 09/2017   T score -2.6 left femoral neck   Peptic ulcer disease    Skin cancer    shoulder, leg, right eye (? squamous by resection)   Thoracic radiculitis    Thyroid  nodule    removed   Urinary incontinence    Vertigo     Past Surgical History:  Procedure Laterality Date   ABDOMINAL HYSTERECTOMY     BSO   BACK SURGERY  2005   lower back   EYE SURGERY Right 2006   LIVER BIOPSY     fatty liver   NASAL SEPTUM SURGERY     ORIF ANKLE FRACTURE Right 01/11/2019   Procedure: OPEN REDUCTION INTERNAL FIXATION (ORIF) RIGHT BIMALLEOLAR ANKLE FRACTURE;  Surgeon: Jerri Kay HERO,  MD;  Location: [facility];  Service: Orthopedics;  Laterality: Right;   right knee surgery  2006   arthroscopy   right leg skin cancer surgery  2014   right shoulder skin cancer excision  2012   SKIN CANCER EXCISION     THYROIDECTOMY      Social History: Social History[1]  Allergies[2]  Family History  Problem Relation Age of Onset   Colon polyps Maternal Aunt    Diabetes Father    Heart disease Father    Heart disease Mother    Lung disease Mother    COPD Mother    Lung cancer Brother     Cancer Brother        lung   COPD Brother    Stroke Brother    Lung cancer Sister    COPD Sister    Kidney disease Other        niece-mat side   Arthritis Other    Diabetes Son    Arthritis Daughter    Colon cancer Neg Hx      Prior to Admission medications  Medication Sig Start Date End Date Taking? Authorizing Provider  acetaminophen  (TYLENOL ) 500 MG tablet Take 1 tablet (500 mg total) by mouth 3 (three) times daily. 01/03/24   Regalado, Belkys A, MD  albuterol  (PROVENTIL ) (2.5 MG/3ML) 0.083% nebulizer solution Take 3 mLs (2.5 mg total) by nebulization every 6 (six) hours as needed for wheezing or shortness of breath. 01/07/24   Wendolyn Jenkins Jansky, MD  ARIPiprazole  (ABILIFY ) 2 MG tablet TAKE 1 TABLET BY MOUTH EVERY DAY *NEW PRESCRIPTION REQUEST* 01/07/24   Wendolyn Jenkins Jansky, MD  ASPIRIN  LOW DOSE 81 MG tablet TAKE 1 TABLET BY MOUTH EVERY DAY *NEW PRESCRIPTION REQUEST* 01/07/24   Wendolyn Jenkins Jansky, MD  budesonide  (PULMICORT ) 1 MG/2ML nebulizer solution INHALE THE CONTENTS OF 1 VIAL VIA NEBULIZER TWICE DAILY *NEW PRESCRIPTION REQUEST* 01/07/24   Wendolyn Jenkins Jansky, MD  buPROPion  (WELLBUTRIN  XL) 150 MG 24 hr tablet Take 1 tablet (150 mg total) by mouth in the morning. 01/07/24   Wendolyn Jenkins Jansky, MD  calcium -vitamin D  (OSCAL WITH D) 500-200 MG-UNIT tablet Take 1 tablet by mouth 3 (three) times daily. 01/11/19   Jerri Kay HERO, MD  cephALEXin  (KEFLEX ) 250 MG capsule Take 1 capsule (250 mg total) by mouth daily. Resume after finished with antibiotics from hospitalization 02/28/24   Tat, Alm, MD  ciprofloxacin  (CIPRO ) 500 MG tablet Take 1 tablet (500 mg total) by mouth 2 (two) times daily. 02/28/24   Evonnie Alm, MD  cyanocobalamin  (VITAMIN B12) 1000 MCG/ML injection Inject 1 mL (1,000 mcg total) into the muscle every 30 (thirty) days. 01/07/24   Wendolyn Jenkins Jansky, MD  diazepam  (VALIUM ) 5 MG tablet Take 1 tablet (5 mg total) by mouth See admin instructions. Take 5 mg by mouth in the evening AS NEEDED  for restlessness and an additional 5 mg once a day as needed for anxiety 01/13/24   Wendolyn Jenkins Jansky, MD  docusate sodium  (COLACE) 100 MG capsule Take 1 capsule (100 mg total) by mouth daily. 01/07/24   Wendolyn Jenkins Jansky, MD  estradiol (ESTRACE) 0.1 MG/GM vaginal cream Place 1 Applicatorful vaginally daily as needed (AS DIRECTED). 03/30/21   [provider]  folic acid  (FOLVITE ) 1 MG tablet Take 1 tablet (1 mg total) by mouth daily. 03/03/24   Wendolyn Jenkins Jansky, MD  guaiFENesin  (MUCINEX ) 600 MG 12 hr tablet Take 600 mg by mouth 2 (two) times daily as  needed for cough or to loosen phlegm.    [provider]  HYDROcodone -acetaminophen  (NORCO/VICODIN) 5-325 MG tablet Take 1 tablet by mouth every 6 (six) hours as needed for moderate pain (pain score 4-6). 03/03/24   Wendolyn Jenkins Jansky, MD  memantine  (NAMENDA ) 5 MG tablet Take 1 tablet (5 mg total) by mouth in the morning and at bedtime. 01/07/24   Wendolyn Jenkins Jansky, MD  omega-3 acid ethyl esters (LOVAZA ) 1 g capsule Take 2 capsules (2 g total) by mouth 2 (two) times daily. 01/22/24   Wendolyn Jenkins Jansky, MD  omeprazole  (PRILOSEC) 40 MG capsule TAKE ONE (1) CAPSULE BY MOUTH TWICE DAILY *NEW PRESCRIPTION REQUEST* 01/07/24   Wendolyn Jenkins Jansky, MD  ondansetron  (ZOFRAN -ODT) 8 MG disintegrating tablet Take 1 tablet (8 mg total) by mouth every 8 (eight) hours as needed for nausea or vomiting. PLACE 1 TABLET ON TONGUE EVERY 8 HOURS AS NEEDED FOR NAUSEA OR VOMITING 01/13/24   Wendolyn Jenkins Jansky, MD  polyethylene glycol (MIRALAX  / GLYCOLAX ) 17 g packet Take 17 g by mouth daily. 01/13/24   Wendolyn Jenkins Jansky, MD  predniSONE  (DELTASONE ) 10 MG tablet Take 6 tablets (60 mg total) by mouth daily with breakfast. And decrease by one tablet daily 02/29/24   Tat, Alm, MD  predniSONE  (DELTASONE ) 5 MG tablet Take 1 tablet (5 mg total) by mouth at bedtime. Resume after finished with prednisone  taper from hospitalization 02/28/24   Evonnie Alm, MD  Probiotic CHEW Chew 1 each  by mouth every other day. 01/07/24   Wendolyn Jenkins Jansky, MD  rOPINIRole  (REQUIP ) 1 MG tablet Take 1 tablet (1 mg total) by mouth as directed. TAKE 1 TABLET IN THE MORNING AND 2 TABLETS IN THE EVENING 01/22/24   Wendolyn Jenkins Jansky, MD  senna-docusate (SENOKOT-S) 8.6-50 MG tablet Take 1 tablet by mouth 2 (two) times daily. 01/03/24   Regalado, Belkys A, MD  sertraline  (ZOLOFT ) 50 MG tablet Take 1 tablet (50 mg total) by mouth in the morning. 01/07/24   Wendolyn Jenkins Jansky, MD  Syringe/Needle, Disp, (SYRINGE 3CC/25GX1) 25G X 1 3 ML MISC Use to give IM B12 injection 01/13/24   Wendolyn Jenkins Jansky, MD  SYSTANE 0.4-0.3 % SOLN Place 1 drop into both eyes 4 (four) times daily as needed (for dryness). 01/13/24   Wendolyn Jenkins Jansky, MD  Tiotropium Bromide  (SPIRIVA  RESPIMAT) 2.5 MCG/ACT AERS Inhale 2 Inhalations into the lungs daily at 12 noon. 01/22/24   Wendolyn Jenkins Jansky, MD  tocilizumab  4 mg/kg in sodium chloride  0.9 % Inject 280 mg into the vein every 28 (twenty-eight) days. 12/03/16   [provider]    Physical Exam: Vitals:   03/22/24 1940 03/22/24 2023 03/22/24 2054 03/22/24 2111  BP: 130/66 (!) 140/74 (!) 146/69   Pulse: 83 83 79   Resp: (!) 31 (!) 21 20   Temp:  99 F (37.2 C) 97.6 F (36.4 C)   TempSrc:  Oral Oral   SpO2: 100% 98% 98%   Weight:    77.4 kg  Height:    5' 3 (1.6 m)   Constitutional: Elderly woman resting in bed.  NAD, calm, comfortable Eyes: EOMI, lids and conjunctivae normal ENMT: Mucous membranes are moist. Posterior pharynx clear of any exudate or lesions.Normal dentition.  Neck: normal, supple, no masses. Respiratory: Bibasilar inspiratory crackles. Normal respiratory effort while on 3 L O2 [place]. No accessory muscle use.  Cardiovascular: Regular rate and rhythm, no murmurs / rubs / gallops.  +1 bilateral lower extremity edema.  2+ pedal pulses. Abdomen: Soft, no tenderness, no masses palpated. Musculoskeletal: no clubbing / cyanosis.  Right wrist brace in place. Normal  muscle tone.  Skin: no rashes, lesions, ulcers. No induration Neurologic: Sensation intact. Strength 5/5 in all 4.  Psychiatric: Alert and oriented to person, place, year but not situation.  EKG: Personally reviewed. Sinus versus ectopic atrial rhythm, LBBB, rate 99.  Similar to previous.  Assessment/Plan Principal Problem:   Acute on chronic respiratory failure with hypoxia (HCC) Active Problems:   Pulmonary edema   Pleural effusion on left   HYPERTENSION, BENIGN   Asthma, moderate persistent   Giant cell arteritis (HCC)   Depression with anxiety   RLS (restless legs syndrome)   [name] is a 89 y.o. female with medical history significant for asthma, chronic respiratory failure with hypoxia in [address] [place] at baseline, GCA/PMR on Actemra  and chronic prednisone  5 mg daily, CKD stage IIIa, HTN, RLS, B12 deficiency, depression/anxiety, dementia, liver mass/transverse colon thickening (presumed neoplasm/metastatic disease -biopsy not pursued) who is admitted with acute on chronic hypoxic respiratory failure likely secondary to new diagnosis of congestive heart failure.  Assessment and Plan: Acute on chronic respiratory failure with hypoxia Pulmonary edema/left pleural effusion LLL consolidation: Patient wears 2 L supplemental O2 via [place] at baseline.  In the ED, SpO2 was 77% on RA and 88% on home 2 L, and improved on 3-4 L [place].  Clinical picture suggestive of congestive heart failure as primary etiology.  She does not have a prior diagnosis of CHF.  CXR with pulmonary edema and moderate left pleural effusion.  Question of left lower lobe consolidation as well.  Since she is immunosuppressed we will continue empiric antibiotics for now. - Continue IV Lasix  40 mg twice daily - Obtain echocardiogram - Strict I/O's and daily weights - Continue IV ceftriaxone  and azithromycin  - Continue supplemental O2 and wean to home 2 L as able  Asthma: Continue Brovana /Pulmicort , home Spiriva , DuoNebs as  needed.  Giant cell arteritis/polymyalgia rheumatica: Continue home prednisone  5 mg daily.  Receives Actemra  infusions as an outpatient every 4 weeks.  Hypertension: Does not appear to be on antihypertensives.  CKD stage IIIa: Renal function stable.  Monitor with diuresis.  Normocytic anemia: Hemoglobin 9.4.  No obvious bleeding.  Continue to monitor.  Depression/anxiety: Continue Abilify , bupropion , sertraline , Valium  as needed.  Dementia: Continue Namenda .  Delirium and fall precautions.  Restless leg syndrome: Continue Requip , home Valium  as needed.  Liver mass/circumferential wall thickening of transverse colon: Findings seen on previous CT imaging 12/30/2023.  Findings concerning for primary neoplasm or metastatic disease.  Patient and family have deferred further workup as she would not want to pursue any oncological treatment.   DVT prophylaxis: enoxaparin  (LOVENOX ) injection 40 mg Start: 03/22/24 2000 Code Status:   Code Status: Limited: Do not attempt resuscitation (DNR) -DNR-LIMITED -Do Not Intubate/DNI   confirmed with patient and daughter on admission. Family Communication: Daughter at bedside Disposition Plan: From home, dispo pending clinical progress Consults called: None Severity of Illness: The appropriate patient status for this patient is INPATIENT. Inpatient status is judged to be reasonable and necessary in order to provide the required intensity of service to ensure the patient's safety. The patient's presenting symptoms, physical exam findings, and initial radiographic and laboratory data in the context of their chronic comorbidities is felt to place them at high risk for further clinical deterioration. Furthermore, it is not anticipated that the patient will be medically stable for discharge from the hospital within  2 midnights of admission.   * I certify that at the point of admission it is my clinical judgment that the patient will require inpatient  hospital care spanning beyond 2 midnights from the point of admission due to high intensity of service, high risk for further deterioration and high frequency of surveillance required.DEWAINE Jorie Blanch MD [facility]  If 7PM-7AM, please contact night-coverage [email or web address]  03/22/2024, 9:16 PM      [1]  Social History Tobacco Use   Smoking status: Never   Smokeless tobacco: Never  Vaping Use   Vaping status: Never Used  Substance Use Topics   Alcohol  use: No   Drug use: No  [2]  Allergies Allergen Reactions   Oxycodone  Itching and Other (See Comments)    Hallucinations   Statins Other (See Comments)    Myalgia    Advair Diskus [Fluticasone -Salmeterol] Other (See Comments)    Mouth sores   Atorvastatin  Other (See Comments)    Severe headache   Sulfonamide Derivatives Rash   "

## 2024-03-22 NOTE — ED Triage Notes (Signed)
 Patient has had cough/SOB this past week, patient wears 2L O2 at baseline, patient satting 77% on RA, was not on oxygen  upon arrival, patient satting 88% on her 2 L, patient bumped to 4L [place] O2 sat now 95% patient denies pain/fever. Patient a/o x 2 at baseline.

## 2024-03-23 ENCOUNTER — Inpatient Hospital Stay (HOSPITAL_COMMUNITY)

## 2024-03-23 ENCOUNTER — Telehealth: Payer: Self-pay | Admitting: Family Medicine

## 2024-03-23 ENCOUNTER — Encounter (HOSPITAL_COMMUNITY): Payer: Self-pay | Admitting: Internal Medicine

## 2024-03-23 ENCOUNTER — Other Ambulatory Visit: Payer: Self-pay

## 2024-03-23 DIAGNOSIS — I5021 Acute systolic (congestive) heart failure: Secondary | ICD-10-CM

## 2024-03-23 LAB — ECHOCARDIOGRAM COMPLETE
Area-P 1/2: 5.23 cm2
Calc EF: 43.9 %
Height: 63 in
MV M vel: 5.54 m/s
MV Peak grad: 122.8 mmHg
P 1/2 time: 252 ms
S' Lateral: 3.6 cm
Single Plane A2C EF: 47.8 %
Single Plane A4C EF: 41.8 %
Weight: 2730.18 [oz_av]

## 2024-03-23 LAB — CBC
HCT: 31.8 % — ABNORMAL LOW (ref 36.0–46.0)
Hemoglobin: 9.7 g/dL — ABNORMAL LOW (ref 12.0–15.0)
MCH: 29.7 pg (ref 26.0–34.0)
MCHC: 30.5 g/dL (ref 30.0–36.0)
MCV: 97.2 fL (ref 80.0–100.0)
Platelets: 299 K/uL (ref 150–400)
RBC: 3.27 MIL/uL — ABNORMAL LOW (ref 3.87–5.11)
RDW: 14.6 % (ref 11.5–15.5)
WBC: 6.8 K/uL (ref 4.0–10.5)
nRBC: 0 % (ref 0.0–0.2)

## 2024-03-23 LAB — BASIC METABOLIC PANEL WITH GFR
Anion gap: 8 (ref 5–15)
BUN: 24 mg/dL — ABNORMAL HIGH (ref 8–23)
CO2: 32 mmol/L (ref 22–32)
Calcium: 9.4 mg/dL (ref 8.9–10.3)
Chloride: 103 mmol/L (ref 98–111)
Creatinine, Ser: 0.92 mg/dL (ref 0.44–1.00)
GFR, Estimated: 58 mL/min — ABNORMAL LOW
Glucose, Bld: 127 mg/dL — ABNORMAL HIGH (ref 70–99)
Potassium: 5 mmol/L (ref 3.5–5.1)
Sodium: 144 mmol/L (ref 135–145)

## 2024-03-23 LAB — MAGNESIUM: Magnesium: 2.2 mg/dL (ref 1.7–2.4)

## 2024-03-23 LAB — STREP PNEUMONIAE URINARY ANTIGEN: Strep Pneumo Urinary Antigen: NEGATIVE

## 2024-03-23 MED ORDER — LORATADINE 10 MG PO TABS
10.0000 mg | ORAL_TABLET | Freq: Every day | ORAL | Status: DC
  Administered 2024-03-23 – 2024-03-29 (×7): 10 mg via ORAL
  Filled 2024-03-23 (×7): qty 1

## 2024-03-23 MED ORDER — METHYLPREDNISOLONE SODIUM SUCC 40 MG IJ SOLR
40.0000 mg | Freq: Once | INTRAMUSCULAR | Status: AC
  Administered 2024-03-23: 40 mg via INTRAVENOUS
  Filled 2024-03-23: qty 1

## 2024-03-23 MED ORDER — VITAMIN D3 25 MCG (1000 UNIT) PO TABS
2000.0000 [IU] | ORAL_TABLET | Freq: Every day | ORAL | Status: DC
  Administered 2024-03-23 – 2024-03-29 (×7): 2000 [IU] via ORAL
  Filled 2024-03-23 (×7): qty 2

## 2024-03-23 MED ORDER — HYDROCODONE-ACETAMINOPHEN 5-325 MG PO TABS
1.0000 | ORAL_TABLET | Freq: Every day | ORAL | Status: DC | PRN
  Administered 2024-03-26: 1 via ORAL
  Filled 2024-03-23: qty 1

## 2024-03-23 MED ORDER — PANTOPRAZOLE SODIUM 40 MG PO TBEC
40.0000 mg | DELAYED_RELEASE_TABLET | Freq: Every day | ORAL | Status: DC
  Administered 2024-03-23: 40 mg via ORAL
  Filled 2024-03-23: qty 1

## 2024-03-23 MED ORDER — GUAIFENESIN-DM 100-10 MG/5ML PO SYRP
5.0000 mL | ORAL_SOLUTION | ORAL | Status: DC | PRN
  Administered 2024-03-23: 5 mL via ORAL
  Filled 2024-03-23: qty 10

## 2024-03-23 MED ORDER — FOLIC ACID 1 MG PO TABS
1.0000 mg | ORAL_TABLET | Freq: Every day | ORAL | Status: DC
  Administered 2024-03-23 – 2024-03-29 (×7): 1 mg via ORAL
  Filled 2024-03-23 (×7): qty 1

## 2024-03-23 MED ORDER — ASPIRIN 81 MG PO TBEC
81.0000 mg | DELAYED_RELEASE_TABLET | Freq: Every day | ORAL | Status: DC
  Administered 2024-03-23 – 2024-03-29 (×7): 81 mg via ORAL
  Filled 2024-03-23 (×7): qty 1

## 2024-03-23 MED ORDER — CALCIUM CARBONATE-VITAMIN D 500-200 MG-UNIT PO TABS: 1.0000 | ORAL_TABLET | Freq: Every day | ORAL | Status: DC

## 2024-03-23 MED ORDER — IPRATROPIUM-ALBUTEROL 0.5-2.5 (3) MG/3ML IN SOLN
3.0000 mL | Freq: Three times a day (TID) | RESPIRATORY_TRACT | Status: DC
  Administered 2024-03-23 – 2024-03-26 (×10): 3 mL via RESPIRATORY_TRACT
  Filled 2024-03-23 (×10): qty 3

## 2024-03-23 MED ORDER — IRBESARTAN 75 MG PO TABS
37.5000 mg | ORAL_TABLET | Freq: Every day | ORAL | Status: DC
  Administered 2024-03-23 – 2024-03-25 (×3): 37.5 mg via ORAL
  Filled 2024-03-23 (×3): qty 0.5

## 2024-03-23 MED ORDER — POLYVINYL ALCOHOL 1.4 % OP SOLN
1.0000 [drp] | Freq: Every day | OPHTHALMIC | Status: DC | PRN
  Administered 2024-03-26: 1 [drp] via OPHTHALMIC
  Filled 2024-03-23: qty 15

## 2024-03-23 MED ORDER — OYSTER SHELL CALCIUM/D3 500-5 MG-MCG PO TABS
1.0000 | ORAL_TABLET | Freq: Every day | ORAL | Status: DC
  Administered 2024-03-24 – 2024-03-29 (×6): 1 via ORAL
  Filled 2024-03-23 (×6): qty 1

## 2024-03-23 MED ORDER — AZITHROMYCIN 250 MG PO TABS
500.0000 mg | ORAL_TABLET | Freq: Every day | ORAL | Status: AC
  Administered 2024-03-23 – 2024-03-26 (×4): 500 mg via ORAL
  Filled 2024-03-23 (×4): qty 2

## 2024-03-23 MED ORDER — OMEGA-3-ACID ETHYL ESTERS 1 G PO CAPS
2.0000 | ORAL_CAPSULE | Freq: Two times a day (BID) | ORAL | Status: DC
  Administered 2024-03-23 – 2024-03-29 (×12): 2 g via ORAL
  Filled 2024-03-23 (×12): qty 2

## 2024-03-23 MED ORDER — POLYETHYL GLYCOL-PROPYL GLYCOL 0.4-0.3 % OP SOLN: 1.0000 [drp] | Freq: Every day | OPHTHALMIC | Status: DC | PRN

## 2024-03-23 MED ORDER — PREDNISONE 20 MG PO TABS
40.0000 mg | ORAL_TABLET | Freq: Every day | ORAL | Status: AC
  Administered 2024-03-24 – 2024-03-27 (×4): 40 mg via ORAL
  Filled 2024-03-23 (×4): qty 2

## 2024-03-23 MED ORDER — BUDESONIDE 0.5 MG/2ML IN SUSP
0.5000 mg | Freq: Two times a day (BID) | RESPIRATORY_TRACT | Status: DC
  Administered 2024-03-23 – 2024-03-29 (×12): 0.5 mg via RESPIRATORY_TRACT
  Filled 2024-03-23 (×12): qty 2

## 2024-03-23 MED ORDER — PANTOPRAZOLE SODIUM 40 MG PO TBEC
40.0000 mg | DELAYED_RELEASE_TABLET | Freq: Every day | ORAL | Status: DC
  Administered 2024-03-24 – 2024-03-29 (×6): 40 mg via ORAL
  Filled 2024-03-23 (×7): qty 1

## 2024-03-23 NOTE — Progress Notes (Addendum)
 " PROGRESS NOTE    [name]  FMW:[id number] DOB: Jan 28, 1930 DOA: 03/22/2024 PCP: Wendolyn Jenkins Jansky, MD   Chief Complaint  Patient presents with   Cough   Shortness of Breath    Brief Narrative:  [name] is a 89 y.o. female with medical history significant for asthma, chronic respiratory failure with hypoxia in [address] at baseline, GCA/PMR on Actemra  and chronic prednisone  5 mg daily, CKD stage IIIa, HTN, RLS, B12 deficiency, depression/anxiety, dementia, liver mass/transverse colon thickening (presumed neoplasm/metastatic disease -biopsy not pursued) who is admitted with acute on chronic hypoxic respiratory failure likely secondary to new diagnosis of congestive heart failure.    Assessment & Plan:   Principal Problem:   Acute on chronic respiratory failure with hypoxia (HCC) Active Problems:   Pulmonary edema   Pleural effusion on left   HYPERTENSION, BENIGN   Asthma, moderate persistent   Giant cell arteritis (HCC)   Depression with anxiety   RLS (restless legs syndrome)   Acute systolic heart failure (HCC)  #1 acute on chronic respiratory failure with hypoxia/pulmonary edema/pleural effusion/new diagnosis of acute systolic CHF/left lower lobe consolidation - Patient noted to chronically be on 2 L supplemental O2 via nasal cannula. - Patient on presentation to the ED noted to have a sats of 77% on room air and 88% on home 2 L which improved on 3 to 4 L nasal cannula. - Presentation consistent with acute CHF exacerbation as primary etiology. - Patient with no prior history of CHF. - Chest x-ray done consistent with pulmonary edema and moderate left pleural effusion. - Concern for left lower lobe pneumonia. - Patient noted to be immunocompromised and subsequently placed empirically on IV antibiotics. - Patient states no significant improvement with shortness of breath since admission. - Patient currently on Lasix  40 mg IV every 12 hours while urine output not accurately  recorded however does show 700 cc since admission. - Current weight of 77.4 kg. - 2D echo with EF of 35 to 40%, left ventricular global hypokinesis, mild concentric LVH.  Right ventricular systolic function normal. - Left atrial size moderately dilated. - Moderate pleural effusion. - Moderate MVR, no evidence of mitral stenosis. - Mild to moderate AVR. - Continue Lasix  40 mg IV every 12 hours. - Continue IV Rocephin  and azithromycin . - Strict I's and O's, daily weights. -Patient noted to have been on Norvasc  2.5 mg daily, Benicar  40 mg daily in the past which per med rec was discontinued by provider.   - Hold Norvasc .   - Place on Avapro  37.5 mg daily. - Repeat chest x-ray in the AM.  2.  Asthma/??  Acute asthma exacerbation -Continue Brovana /Pulmicort . - Place on scheduled DuoNebs. - DC Incruse. - Patient noted with expiratory wheezing on examination.   -Give a dose of Solu-Medrol  40 mg IV x 1 and then placed on a prednisone  taper.  3.  Giant cell arteritis/polymyalgia rheumatica -Patient noted to be on prednisone  5 mg daily prior to admission however due to concern for possible asthma exacerbation will discontinue home right treatment of prednisone , placed on IV Solu-Medrol  x 1 and then prednisone  taper back to home regimen of 5 mg daily.  4.  Hypertension -Patient noted to have been on Norvasc  2.5 mg daily, Benicar  40 mg daily in the past which per med rec was discontinued by provider.   - Patient currently on IV Lasix  which we will continue.   - Start Avapro  37.5 mg daily secondary to acute CHF with depressed  EF.  5.  CKD stage IIIa -Stable. - Monitor with diuresis and initiation of ARB.  6.  Normocytic anemia -Patient with no overt bleeding. - Hemoglobin stable at 9.7 from admission. - Check anemia panel. - Follow H&H. - Transfusion threshold hemoglobin < 8.  7.  Depression/anxiety -Continue home regimen Abilify , bupropion , sertraline , Valium  as needed.  8.   Dementia -Namenda . - Continue fall and delirium precautions.  9.  Restless leg syndrome -Requip . - Valium  as needed.  10.  Liver mass/circumferential wall thickening of the transverse colon -Noted on prior CT imaging of 12/30/2023. - Findings on CT concerning for primary neoplasm or metastatic disease. - Patient and family noted to have deferred workup as she would not want to pursue any oncological treatment.   DVT prophylaxis: Lovenox  Code Status: DNR limited Family Communication: Updated patient and daughter at bedside. Disposition: TBD  Status is: Inpatient Remains inpatient appropriate because: Severity of illness   Consultants:  None  Procedures:  Chest x-ray 03/22/2024 2D echo 03/23/2024   Antimicrobials:  Anti-infectives (From admission, onward)    Start     Dose/Rate Route Frequency Ordered Stop   03/23/24 1845  azithromycin  (ZITHROMAX ) 500 mg in sodium chloride  0.9 % 250 mL IVPB  Status:  Discontinued        500 mg 250 mL/hr over 60 Minutes Intravenous Every 24 hours 03/22/24 2109 03/23/24 1258   03/23/24 1830  cefTRIAXone  (ROCEPHIN ) 2 g in sodium chloride  0.9 % 100 mL IVPB        2 g 200 mL/hr over 30 Minutes Intravenous Every 24 hours 03/22/24 2109 03/27/24 1829   03/23/24 1800  azithromycin  (ZITHROMAX ) tablet 500 mg        500 mg Oral Daily-1800 03/23/24 1258 03/27/24 1759   03/22/24 1800  cefTRIAXone  (ROCEPHIN ) 2 g in sodium chloride  0.9 % 100 mL IVPB        2 g 200 mL/hr over 30 Minutes Intravenous  Once 03/22/24 1752 03/22/24 1857   03/22/24 1800  azithromycin  (ZITHROMAX ) 500 mg in sodium chloride  0.9 % 250 mL IVPB        500 mg 250 mL/hr over 60 Minutes Intravenous  Once 03/22/24 1752 03/22/24 1946         Subjective: Patient hard of hearing.  Patient laying in bed.  Patient states she feels shortness of breath is worse than on admission.  Patient complaining of nonproductive cough.  Denies any chest pain.  No abdominal pain.  Unsure as to how much  urine output she has had.  Objective: Vitals:   03/23/24 0849 03/23/24 0919 03/23/24 1252 03/23/24 1415  BP: (!) 150/57  (!) 141/58   Pulse: 77  81   Resp: 18  16   Temp: 98.3 F (36.8 C)  98.5 F (36.9 C)   TempSrc: Oral  Oral   SpO2: 100% 99% 100% 99%  Weight:      Height:        Intake/Output Summary (Last 24 hours) at 03/23/2024 1631 Last data filed at 03/23/2024 1012 Gross per 24 hour  Intake 463 ml  Output 700 ml  Net -237 ml   Filed Weights   03/22/24 2111 03/23/24 0454  Weight: 77.4 kg 77.4 kg    Examination:  General exam: Appears calm and comfortable  Respiratory system: Diffuse crackles.  Decreased breath sounds in the left base.  Expiratory wheezing.  Cardiovascular system: S1 & S2 heard, RRR. No JVD, murmurs, rubs, gallops or clicks.  1-2+ bilateral lower  extremity edema.  Gastrointestinal system: Abdomen is nondistended, soft and nontender. No organomegaly or masses felt. Normal bowel sounds heard. Central nervous system: Alert and oriented. No focal neurological deficits. Extremities: Symmetric 5 x 5 power. Skin: No rashes, lesions or ulcers Psychiatry: Judgement and insight appear normal. Mood & affect appropriate.     Data Reviewed: I have personally reviewed following labs and imaging studies  CBC: Recent Labs  Lab 03/22/24 1609 03/23/24 0401  WBC 8.9 6.8  HGB 9.4* 9.7*  HCT 31.1* 31.8*  MCV 98.1 97.2  PLT 278 299    Basic Metabolic Panel: Recent Labs  Lab 03/22/24 1609 03/23/24 0401  NA 142 144  K 4.5 5.0  CL 105 103  CO2 28 32  GLUCOSE 105* 127*  BUN 22 24*  CREATININE 0.95 0.92  CALCIUM  9.4 9.4  MG  --  2.2    GFR: Estimated Creatinine Clearance: 38.4 mL/min (by C-G formula based on SCr of 0.92 mg/dL).  Liver Function Tests: No results for input(s): AST, ALT, ALKPHOS, BILITOT, PROT, ALBUMIN in the last 168 hours.  CBG: No results for input(s): GLUCAP in the last 168 hours.   Recent Results (from the past  240 hours)  Resp panel by RT-PCR (RSV, Flu A&B, Covid) Anterior Nasal Swab     Status: None   Collection Time: 03/22/24  4:09 PM   Specimen: Anterior Nasal Swab  Result Value Ref Range Status   SARS Coronavirus 2 by RT PCR NEGATIVE NEGATIVE Final    Comment: (NOTE) SARS-CoV-2 target nucleic acids are NOT DETECTED.  The SARS-CoV-2 RNA is generally detectable in upper respiratory specimens during the acute phase of infection. The lowest concentration of SARS-CoV-2 viral copies this assay can detect is 138 copies/mL. A negative result does not preclude SARS-Cov-2 infection and should not be used as the sole basis for treatment or other patient management decisions. A negative result may occur with  improper specimen collection/handling, submission of specimen other than nasopharyngeal swab, presence of viral mutation(s) within the areas targeted by this assay, and inadequate number of viral copies(<138 copies/mL). A negative result must be combined with clinical observations, patient history, and epidemiological information. The expected result is Negative.  Fact Sheet for Patients:  bloggercourse.com  Fact Sheet for Healthcare Providers:  seriousbroker.it  This test is no t yet approved or cleared by the [place]  FDA and  has been authorized for detection and/or diagnosis of SARS-CoV-2 by FDA under an Emergency Use Authorization (EUA). This EUA will remain  in effect (meaning this test can be used) for the duration of the COVID-19 declaration under Section 564(b)(1) of the Act, 21 U.S.C.section 360bbb-3(b)(1), unless the authorization is terminated  or revoked sooner.       Influenza A by PCR NEGATIVE NEGATIVE Final   Influenza B by PCR NEGATIVE NEGATIVE Final    Comment: (NOTE) The Xpert Xpress SARS-CoV-2/FLU/RSV plus assay is intended as an aid in the diagnosis of influenza from Nasopharyngeal swab specimens and should  not be used as a sole basis for treatment. Nasal washings and aspirates are unacceptable for Xpert Xpress SARS-CoV-2/FLU/RSV testing.  Fact Sheet for Patients: bloggercourse.com  Fact Sheet for Healthcare Providers: seriousbroker.it  This test is not yet approved or cleared by the [place]  FDA and has been authorized for detection and/or diagnosis of SARS-CoV-2 by FDA under an Emergency Use Authorization (EUA). This EUA will remain in effect (meaning this test can be used) for the duration of the COVID-19 declaration under  Section 564(b)(1) of the Act, 21 U.S.C. section 360bbb-3(b)(1), unless the authorization is terminated or revoked.     Resp Syncytial Virus by PCR NEGATIVE NEGATIVE Final    Comment: (NOTE) Fact Sheet for Patients: bloggercourse.com  Fact Sheet for Healthcare Providers: seriousbroker.it  This test is not yet approved or cleared by the [place]  FDA and has been authorized for detection and/or diagnosis of SARS-CoV-2 by FDA under an Emergency Use Authorization (EUA). This EUA will remain in effect (meaning this test can be used) for the duration of the COVID-19 declaration under Section 564(b)(1) of the Act, 21 U.S.C. section 360bbb-3(b)(1), unless the authorization is terminated or revoked.  Performed at [facility], [address], [place], [place] [address]          Radiology Studies: ECHOCARDIOGRAM COMPLETE Result Date: 03/23/2024    ECHOCARDIOGRAM REPORT   Patient Name:   TRISHA KEN Date of Exam: 03/23/2024 Medical Rec #:  [id number]     Height:       63.0 in Accession #:    [id number]    Weight:       170.6 lb Date of Birth:  01/30/1930     BSA:          1.807 m Patient Age:    89 years      BP:           157/71 mmHg Patient Gender: F             HR:           142 bpm. Exam Location:  Inpatient Procedure: Cardiac Doppler,  Color Doppler and 2D Echo (Both Spectral and Color            Flow Doppler were utilized during procedure). Indications:    CHF  History:        Patient has no prior history of Echocardiogram examinations.                 Risk Factors:Hypertension.  Sonographer:    Philomena Daring Referring Phys: [id number] VISHAL R PATEL IMPRESSIONS  1. Left ventricular ejection fraction, by estimation, is 35 to 40%. The left ventricle has moderately decreased function. The left ventricle demonstrates global hypokinesis. There is mild concentric left ventricular hypertrophy. Left ventricular diastolic parameters are indeterminate.  2. Right ventricular systolic function is normal. The right ventricular size is normal.  3. Left atrial size was moderately dilated.  4. Moderate pleural effusion.  5. The mitral valve is degenerative. Moderate mitral valve regurgitation. No evidence of mitral stenosis.  6. The aortic valve is tricuspid. Aortic valve regurgitation is mild to moderate. No aortic stenosis is present.  7. The inferior vena cava is dilated in size with <50% respiratory variability, suggesting right atrial pressure of 15 mmHg. Comparison(s): No prior Echocardiogram. FINDINGS  Left Ventricle: Left ventricular ejection fraction, by estimation, is 35 to 40%. The left ventricle has moderately decreased function. The left ventricle demonstrates global hypokinesis. The left ventricular internal cavity size was normal in size. There is mild concentric left ventricular hypertrophy. Left ventricular diastolic parameters are indeterminate. Right Ventricle: The right ventricular size is normal. No increase in right ventricular wall thickness. Right ventricular systolic function is normal. Left Atrium: Left atrial size was moderately dilated. Right Atrium: Right atrial size was normal in size. Pericardium: There is no evidence of pericardial effusion. Mitral Valve: The mitral valve is degenerative in appearance. Moderate mitral valve  regurgitation. No evidence of mitral valve stenosis. Tricuspid  Valve: The tricuspid valve is normal in structure. Tricuspid valve regurgitation is not demonstrated. No evidence of tricuspid stenosis. Aortic Valve: The aortic valve is tricuspid. Aortic valve regurgitation is mild to moderate. Aortic regurgitation PHT measures 252 msec. No aortic stenosis is present. Pulmonic Valve: The pulmonic valve was normal in structure. Pulmonic valve regurgitation is not visualized. No evidence of pulmonic stenosis. Aorta: The aortic root and ascending aorta are structurally normal, with no evidence of dilitation. Venous: The inferior vena cava is dilated in size with less than 50% respiratory variability, suggesting right atrial pressure of 15 mmHg. IAS/Shunts: No atrial level shunt detected by color flow Doppler. Additional Comments: There is a moderate pleural effusion.  LEFT VENTRICLE PLAX 2D LVIDd:         4.80 cm      Diastology LVIDs:         3.60 cm      LV e' medial:    3.59 cm/s LV PW:         1.10 cm      LV E/e' medial:  40.9 LV IVS:        1.10 cm      LV e' lateral:   5.66 cm/s LVOT diam:     1.80 cm      LV E/e' lateral: 26.0 LV SV:         40 LV SV Index:   22 LVOT Area:     2.54 cm  LV Volumes (MOD) LV vol d, MOD A2C: 100.0 ml LV vol d, MOD A4C: 108.0 ml LV vol s, MOD A2C: 52.2 ml LV vol s, MOD A4C: 62.9 ml LV SV MOD A2C:     47.8 ml LV SV MOD A4C:     108.0 ml LV SV MOD BP:      47.1 ml RIGHT VENTRICLE             IVC RV Basal diam:  3.10 cm     IVC diam: 2.00 cm RV Mid diam:    2.00 cm RV S prime:     10.30 cm/s TAPSE (M-mode): 1.6 cm LEFT ATRIUM             Index        RIGHT ATRIUM           Index LA diam:        4.40 cm 2.43 cm/m   RA Area:     18.90 cm LA Vol (A2C):   75.3 ml 41.66 ml/m  RA Volume:   51.30 ml  28.38 ml/m LA Vol (A4C):   80.4 ml 44.48 ml/m LA Biplane Vol: 80.7 ml 44.65 ml/m  AORTIC VALVE LVOT Vmax:   87.70 cm/s LVOT Vmean:  58.000 cm/s LVOT VTI:    0.156 m AI PHT:      252 msec   AORTA Ao Root diam: 2.40 cm Ao Asc diam:  2.80 cm MITRAL VALVE                TRICUSPID VALVE MV Area (PHT): 5.23 cm     TR Peak grad:   6.4 mmHg MV Decel Time: 145 msec     TR Vmax:        126.00 cm/s MR Peak grad: 122.8 mmHg MR Mean grad: 81.0 mmHg     SHUNTS MR Vmax:      554.00 cm/s   Systemic VTI:  0.16 m MR Vmean:     431.0 cm/s    Systemic  Diam: 1.80 cm MV E velocity: 147.00 cm/s MV A velocity: 130.00 cm/s MV E/A ratio:  1.13 Stanly Leavens MD Electronically signed by Stanly Leavens MD Signature Date/Time: 03/23/2024/12:25:10 PM    Final    DG Chest 2 View Result Date: 03/22/2024 EXAM: 2 VIEW(S) XRAY OF THE CHEST 03/22/2024 04:55:00 PM COMPARISON: 02/25/2024 CLINICAL HISTORY: SOB FINDINGS: LUNGS AND PLEURA: Mild pulmonary edema. Possible left lower lobe consolidation. Moderate left pleural effusion. No pneumothorax. HEART AND MEDIASTINUM: Cardiomegaly. Calcified aorta. BONES AND SOFT TISSUES: No acute osseous abnormality. IMPRESSION: 1. Mild pulmonary edema. 2. Moderate left pleural effusion. 3. Possible left lower lobe consolidation. 4. Cardiomegaly. Electronically signed by: Greig Pique MD 03/22/2024 05:44 PM EST RP Workstation: [id number]        Scheduled Meds:  arformoterol   15 mcg Nebulization BID   ARIPiprazole   2 mg Oral Daily   aspirin  EC  81 mg Oral Daily   azithromycin   500 mg Oral q1800   budesonide  (PULMICORT ) nebulizer solution  0.5 mg Nebulization BID   buPROPion   150 mg Oral q AM   [START ON 03/24/2024] calcium -vitamin D   1 tablet Oral Q breakfast   cholecalciferol   2,000 Units Oral Daily   enoxaparin  (LOVENOX ) injection  40 mg Subcutaneous Q24H   folic acid   1 mg Oral Daily   furosemide   40 mg Intravenous Q12H   guaiFENesin   600 mg Oral BID   ipratropium-albuterol   3 mL Nebulization TID   loratadine   10 mg Oral Daily   memantine   5 mg Oral BID   omega-3 acid ethyl esters  2 capsule Oral BID   pantoprazole   40 mg Oral Daily   predniSONE   5 mg Oral QHS    rOPINIRole   1 mg Oral Daily   rOPINIRole   2 mg Oral QHS   sertraline   50 mg Oral q AM   sodium chloride  flush  3 mL Intravenous Q12H   Continuous Infusions:  cefTRIAXone  (ROCEPHIN )  IV       LOS: 1 day    Time spent: 40 minutes    Toribio Hummer, MD [facility]   To contact the attending provider between 7A-7P or the covering provider during after hours 7P-7A, please log into the web site [email or web address] and access using universal [place] password for that web site. If you do not have the password, please call the hospital operator.  03/23/2024, 4:31 PM    "

## 2024-03-23 NOTE — Progress Notes (Signed)
 Heart Failure Navigator Progress Note  Assessed for Heart & Vascular TOC clinic readiness.  Patient does not meet criteria due to per MD note patient with history of Dementia. No HF TOC. .   Navigator will sign off at this time.   Randie Bustle, BSN, Scientist, clinical (histocompatibility and immunogenetics) Only

## 2024-03-23 NOTE — Progress Notes (Signed)
 Pharmacy: Antimicrobial Stewardship Note   Stewardship team informed of national backorder of IV azithromycin  - sent message to Dr. Sebastian, will transition this patient to oral azithromycin . Noted good oral bioavailability and patient able to take po.   Thank you for allowing pharmacy to be a part of this patients care.  Almarie Lunger, PharmD, BCPS, BCIDP Infectious Diseases Clinical Pharmacist 03/23/2024 12:57 PM   **Pharmacist phone directory can now be found on [email or web address] (PW [id number]).  Listed under [facility].

## 2024-03-23 NOTE — Telephone Encounter (Signed)
 [facility] faxed Home Health Certificate (Order [place] [id number]), to be filled out by provider. [facility] requested to send it back via Fax within ASAP. Document is located in providers tray at front office.Please advise at [phone].

## 2024-03-24 ENCOUNTER — Inpatient Hospital Stay (HOSPITAL_COMMUNITY)

## 2024-03-24 DIAGNOSIS — J9621 Acute and chronic respiratory failure with hypoxia: Secondary | ICD-10-CM | POA: Diagnosis not present

## 2024-03-24 DIAGNOSIS — I5041 Acute combined systolic (congestive) and diastolic (congestive) heart failure: Secondary | ICD-10-CM

## 2024-03-24 LAB — CBC WITH DIFFERENTIAL/PLATELET
Abs Immature Granulocytes: 0.06 K/uL (ref 0.00–0.07)
Basophils Absolute: 0 K/uL (ref 0.0–0.1)
Basophils Relative: 0 %
Eosinophils Absolute: 0 K/uL (ref 0.0–0.5)
Eosinophils Relative: 0 %
HCT: 30.5 % — ABNORMAL LOW (ref 36.0–46.0)
Hemoglobin: 9.4 g/dL — ABNORMAL LOW (ref 12.0–15.0)
Immature Granulocytes: 1 %
Lymphocytes Relative: 8 %
Lymphs Abs: 0.7 K/uL (ref 0.7–4.0)
MCH: 29.7 pg (ref 26.0–34.0)
MCHC: 30.8 g/dL (ref 30.0–36.0)
MCV: 96.2 fL (ref 80.0–100.0)
Monocytes Absolute: 0.4 K/uL (ref 0.1–1.0)
Monocytes Relative: 4 %
Neutro Abs: 8 K/uL — ABNORMAL HIGH (ref 1.7–7.7)
Neutrophils Relative %: 87 %
Platelets: 311 K/uL (ref 150–400)
RBC: 3.17 MIL/uL — ABNORMAL LOW (ref 3.87–5.11)
RDW: 14.5 % (ref 11.5–15.5)
WBC: 9.2 K/uL (ref 4.0–10.5)
nRBC: 0 % (ref 0.0–0.2)

## 2024-03-24 LAB — FERRITIN: Ferritin: 168 ng/mL (ref 11–307)

## 2024-03-24 LAB — BASIC METABOLIC PANEL WITH GFR
Anion gap: 9 (ref 5–15)
BUN: 30 mg/dL — ABNORMAL HIGH (ref 8–23)
CO2: 31 mmol/L (ref 22–32)
Calcium: 9.3 mg/dL (ref 8.9–10.3)
Chloride: 97 mmol/L — ABNORMAL LOW (ref 98–111)
Creatinine, Ser: 1.41 mg/dL — ABNORMAL HIGH (ref 0.44–1.00)
GFR, Estimated: 35 mL/min — ABNORMAL LOW
Glucose, Bld: 158 mg/dL — ABNORMAL HIGH (ref 70–99)
Potassium: 4.9 mmol/L (ref 3.5–5.1)
Sodium: 138 mmol/L (ref 135–145)

## 2024-03-24 LAB — LEGIONELLA PNEUMOPHILA SEROGP 1 UR AG: L. pneumophila Serogp 1 Ur Ag: NEGATIVE

## 2024-03-24 LAB — IRON AND TIBC
Iron: 20 ug/dL — ABNORMAL LOW (ref 28–170)
Saturation Ratios: 9 % — ABNORMAL LOW (ref 10.4–31.8)
TIBC: 224 ug/dL — ABNORMAL LOW (ref 250–450)
UIBC: 204 ug/dL

## 2024-03-24 LAB — VITAMIN B12: Vitamin B-12: 1468 pg/mL — ABNORMAL HIGH (ref 180–914)

## 2024-03-24 LAB — MAGNESIUM: Magnesium: 2.4 mg/dL (ref 1.7–2.4)

## 2024-03-24 LAB — FOLATE: Folate: >20 ng/mL

## 2024-03-24 MED ORDER — HYDROCODONE BIT-HOMATROP MBR 5-1.5 MG/5ML PO SOLN
5.0000 mL | Freq: Four times a day (QID) | ORAL | Status: DC | PRN
  Administered 2024-03-26 (×2): 5 mL via ORAL
  Filled 2024-03-24 (×2): qty 5

## 2024-03-24 MED ORDER — ENOXAPARIN SODIUM 30 MG/0.3ML IJ SOSY
30.0000 mg | PREFILLED_SYRINGE | INTRAMUSCULAR | Status: DC
  Administered 2024-03-24: 30 mg via SUBCUTANEOUS
  Filled 2024-03-24: qty 0.3

## 2024-03-24 MED ORDER — FUROSEMIDE 10 MG/ML IJ SOLN
40.0000 mg | Freq: Every day | INTRAMUSCULAR | Status: DC
  Administered 2024-03-24 – 2024-03-26 (×3): 40 mg via INTRAVENOUS
  Filled 2024-03-24 (×3): qty 4

## 2024-03-24 NOTE — TOC Initial Note (Signed)
 Transition of Care [facility]) - Initial/Assessment Note   Patient Details  Name: [name] MRN: [id number] Date of Birth: 1929-04-28  Transition of Care [facility]) CM/SW Contact:    Duwaine GORMAN Aran, LCSW Phone Number: 03/24/2024, 11:53 AM  Clinical Narrative: Patient is from home with family. Care management consulted for heart failure screening, but patient was screened out by heart failure navigation team. Patient is active with [facility] for HHPT/OT/RN. Patient has DME (hospital bed, wheelchair, rolling walker, 3N1, O2 through [employer]) at home. PT/OT consulted. Care management to follow.  Expected Discharge Plan: Home w Home Health Services Barriers to Discharge: Continued Medical Work up  Patient Goals and CMS Choice Patient states their goals for this hospitalization and ongoing recovery are:: Home  Expected Discharge Plan and Services In-house Referral: Clinical Social Work Living arrangements for the past 2 months: Single Family Home           DME Arranged: N/A DME Agency: NA HH Arranged: PT, OT, RN HH Agency: [facility] Date HH Agency Contacted: 03/24/24 Time HH Agency Contacted: 1132 Representative spoke with at [facility] Agency: [place]   Prior Living Arrangements/Services Living arrangements for the past 2 months: Single Family Home Lives with:: Adult Children Patient language and need for interpreter reviewed:: Yes Do you feel safe going back to the place where you live?: Yes      Need for Family Participation in Patient Care: Yes (Comment) Care giver support system in place?: Yes (comment) Current home services: DME, Home OT, Home PT, Home RN (Home O2 thru [employer], hospital bed, rolling walker, 3N1, wheelchair. Active w/[facility] for [facility].) Criminal Activity/Legal Involvement Pertinent to Current Situation/Hospitalization: No - Comment as needed  Activities of Daily Living ADL Screening (condition at time of admission) Independently performs ADLs?: No Does the patient  have a NEW difficulty with bathing/dressing/toileting/self-feeding that is expected to last >3 days?: No Does the patient have a NEW difficulty with getting in/out of bed, walking, or climbing stairs that is expected to last >3 days?: No Does the patient have a NEW difficulty with communication that is expected to last >3 days?: No Is the patient deaf or have difficulty hearing?: Yes Does the patient have difficulty seeing, even when wearing glasses/contacts?: No Does the patient have difficulty concentrating, remembering, or making decisions?: Yes  Emotional Assessment Orientation: : Oriented to Self, Oriented to Place Alcohol  / Substance Use: Not Applicable Psych Involvement: No (comment)  Admission diagnosis:  Pulmonary infiltrate [R91.8] Acute on chronic respiratory failure with hypoxia (HCC) [J96.21] Acute on chronic congestive heart failure, unspecified heart failure type [facility]) [I50.9] Patient Active Problem List   Diagnosis Date Noted   Acute systolic heart failure (HCC) 03/23/2024   Acute on chronic respiratory failure with hypoxia (HCC) 03/22/2024   Pulmonary edema 03/22/2024   Pleural effusion on left 03/22/2024   Closed fracture of multiple pubic rami, left, initial encounter (HCC) 02/25/2024   Asthma, chronic obstructive, with acute exacerbation (HCC) 02/25/2024   Major neurocognitive disorder (HCC) 02/25/2024   Near syncope 12/30/2023   Hypotension 12/30/2023   Distal radius fracture, right 12/30/2023   Liver lesion 12/30/2023   UTI (urinary tract infection) 12/30/2023   Age-related cognitive decline 09/29/2022   RLS (restless legs syndrome) 08/30/2021   Chronic constipation 08/30/2021   Cervicalgia 08/30/2021   Immunocompromised 09/01/2020   Acute vulvitis 07/31/2018   Mixed stress and urge urinary incontinence 09/04/2017   Elevated hemoglobin 04/04/2017   Chronic back pain 01/28/2017   Morbid obesity (HCC) 01/28/2017  Peptic ulcer disease    Lumbar radiculitis     Frequent falls 08/02/2016   Hypertensive retinopathy of both eyes 09/07/2015   Medicare annual wellness visit, subsequent 07/11/2015   Long-term use of high-risk medication 07/11/2015   Osteopenia 04/11/2015   Depression with anxiety 04/11/2015   Giant cell arteritis (HCC) 03/28/2015   Polymyalgia rheumatica 03/16/2015   Chronic narcotic use 03/16/2015   Anemia, iron deficiency 07/14/2013   Asthma, moderate persistent 10/15/2012   Hypertriglyceridemia 12/27/2008   HYPERTENSION, BENIGN 12/27/2008   PCP:  Wendolyn Jenkins Jansky, MD Pharmacy:   [facility]  - [place], [place] - [address] [place] [address] Phone: [phone] Fax: [phone]  [id number] - [place], [place] - [address] [place] [address] Phone: [phone] Fax: [phone]  Social Drivers of Health (SDOH) Social History: SDOH Screenings   Food Insecurity: No Food Insecurity (03/23/2024)  Housing: Low Risk (03/23/2024)  Transportation Needs: No Transportation Needs (03/23/2024)  Utilities: Not At Risk (03/23/2024)  Alcohol  Screen: Low Risk (08/26/2023)  Depression (PHQ2-9): Medium Risk (09/30/2023)  Financial Resource Strain: Low Risk (08/26/2023)  Physical Activity: Insufficiently Active (08/26/2023)  Social Connections: Socially Isolated (03/23/2024)  Stress: No Stress Concern Present (08/26/2023)  Tobacco Use: Low Risk (03/23/2024)  Health Literacy: Adequate Health Literacy (08/26/2023)   SDOH Interventions:    Readmission Risk Interventions    03/24/2024   11:14 AM 02/28/2024   11:11 AM  Readmission Risk Prevention Plan  Transportation Screening Complete Complete  HRI or Home Care Consult  Complete  Social Work Consult for Recovery Care Planning/Counseling  Complete  Palliative Care Screening  Not Applicable  Medication Review [facility]) Complete Complete  HRI or Home Care  Consult Complete   SW Recovery Care/Counseling Consult Complete   Palliative Care Screening Not Applicable   Skilled Nursing Facility Not Applicable

## 2024-03-24 NOTE — Progress Notes (Signed)
 " PROGRESS NOTE [name]  FMW:[id number] DOB: 1929-07-06 DOA: 03/22/2024 PCP: Sally Jenkins Jansky, MD  Brief Narrative/Hospital Course: [name] is a 89 y.o. female with PMH of for asthma, chronic respiratory failure with hypoxia in [address] at baseline, GCA/PMR on Actemra  and chronic prednisone  5 mg daily, CKD stage IIIa, HTN, RLS, B12 deficiency, depression/anxiety, dementia, liver mass/transverse colon thickening (presumed neoplasm/metastatic disease -biopsy not pursued) who is admitted with acute on chronic hypoxic respiratory failure likely secondary to new diagnosis of congestive heart failure after presenting on 03/22/2024 with chronic cough shortness of breath, productive cough-d proBNP 23,103, troponin T 96 >85, WBC 8.9, hemoglobin 9.4, platelets 278, sodium 142, potassium 4.5, bicarb 28, BUN 22, creatinine 0.95, serum glucose 105.  X-ray showed pulmonary edema moderate left pleural effusion possible LLL consolidation.\ 1/6-TTE: EF 35-40%, GHK.  And cardiology was consulted  Subjective: Seen and examined today Feels better overall still cough, dry sounds wet,' Is hard of hearing with some baseline dementia Daughter at the bedside Overnight no fever, on 2l [place] home setting,blood pressure 140-170,afebrile,  on 2l [place],Labs-creatinine bumped 1.4 from one 0.9 iron studies low with saturation 9 ferritin 168 with high folate and B12, hb stable ~9 g   Assessment and plan:  Acute systolic CHF-new diagnosis LLL consilidation/acute pulmonary edema left pleural effusion: Presented with shortness of breath cough workup showed new onset systolic CHF, pleural effusion LLL consolidation. Echo with low EF 35-40%, cardiology has been consulted. Son passed away last 2024/04/12- ? If stress induced  Bump in creatinine this morning Lasix  dose adjusted to daily-GDMT- Arbs-per cardiology. Cont to monitor daily I/O,weight, electrolytes and net balance as below.Keep on  salt/fluid restricted diet and monitor in  tele. Net IO Since Admission: -357 mL [03/24/24 1238]  Filed Weights   03/22/24 2111 03/23/24 0454  Weight: 77.4 kg 77.4 kg    Recent Labs  Lab 03/22/24 1609 03/23/24 0401 03/24/24 0354  PROBNP 23,103.0*  --   --   BUN 22 24* 30*  CREATININE 0.95 0.92 1.41*  K 4.5 5.0 4.9  MG  --  2.2 2.4    Acute on chronic respiratory failure with hypoxia on 2l [place] at baseline: Community-acquired pneumonia Immunocompromise status: Given her immunocompromise status placed on empiric antibiotic for pneumonia-ceftriaxone /azithromycin -strep pneumoniae negative, Legionella pending.  Continue supplemental oxygen  to maintain saturation above 90%,, was 77% on RA in ED   Hypertension: BP controlled.Holding home Norvasc , continue Avapro , ASA.   Asthma/??  Acute asthma exacerbation: Continue Brovana  Pulmicort , bronchodilators and steroid taper  Giant cell arteritis/polymyalgia rheumatica: Chronically on Prednisone  5 mg,on Actemra  iv q28d- last one in Nov- was held due to being on antibiotics. Continue steroid as above.   AKI on CKD stage IIIa Noticed bump in creatinine hold diuretics pending cardiology input  Normocytic anemia Iron deficiency anemia, no acute blood loss noticed hemoglobin stable, add oral iron supplement. Anemia panel done.  Dementia Anxiety/Depression RLS: Continue home regimen Abilify , bupropion , sertraline , Requip , PRN Valium  Pulmonary Continue Namenda , keep in delirium precaution fall precaution   Liver mass/circumferential wall thickening of the transverse colon: Noted on prior CT imaging of 12/30/2023.Findings on CT concerning for primary neoplasm or metastatic disease. OF NOTE patient and family noted to have deferred workup as she would not want to pursue any oncological treatment.  Class I Obesity w/ Body mass index is 30.23 kg/m.: Will benefit with PCP follow-up, weight loss  Mobility: PT Orders: Active PT Follow up Rec:     DVT prophylaxis: enoxaparin  (LOVENOX )  injection 40 mg Start: 03/22/24 2000 Code Status:   Code Status: Limited: Do not attempt resuscitation (DNR) -DNR-LIMITED -Do Not Intubate/DNI  Family Communication: plan of care discussed with patient/ daughter at bedside. Patient status is: Remains hospitalized because of severity of illness Level of care: Telemetry   Dispo: The patient is from: HOME w/ son who passes last saturday            Anticipated disposition: TBD. Objective: Vitals last 24 hrs: Vitals:   03/23/24 2128 03/23/24 2205 03/24/24 0408 03/24/24 0734  BP:   (!) 157/68   Pulse:   79   Resp:   18   Temp:   98 F (36.7 C)   TempSrc:      SpO2: 97% 98% 100% 99%  Weight:      Height:        Physical Examination: General exam: alert awake, oriented, older than stated age HEENT:Oral mucosa moist, Ear/Nose WNL grossly Respiratory system: Bilaterally clear BS,no use of accessory muscle Cardiovascular system: S1 & S2 +, No JVD. Gastrointestinal system: Abdomen soft,NT,ND, BS+ Nervous System: Alert, awake, moving all extremities,and following commands. Extremities: extremities warm, leg edema + improving Skin: Warm, no rashes MSK: Normal muscle bulk,tone, power   Medications reviewed:  Scheduled Meds:  arformoterol   15 mcg Nebulization BID   ARIPiprazole   2 mg Oral Daily   aspirin  EC  81 mg Oral Daily   azithromycin   500 mg Oral q1800   budesonide  (PULMICORT ) nebulizer solution  0.5 mg Nebulization BID   buPROPion   150 mg Oral q AM   calcium -vitamin D   1 tablet Oral Q breakfast   cholecalciferol   2,000 Units Oral Daily   enoxaparin  (LOVENOX ) injection  40 mg Subcutaneous Q24H   folic acid   1 mg Oral Daily   furosemide   40 mg Intravenous Daily   guaiFENesin   600 mg Oral BID   ipratropium-albuterol   3 mL Nebulization TID   irbesartan   37.5 mg Oral Daily   loratadine   10 mg Oral Daily   memantine   5 mg Oral BID   omega-3 acid ethyl esters  2 capsule Oral BID   pantoprazole   40 mg Oral Daily   predniSONE   40  mg Oral QAC breakfast   rOPINIRole   1 mg Oral Daily   rOPINIRole   2 mg Oral QHS   sertraline   50 mg Oral q AM   sodium chloride  flush  3 mL Intravenous Q12H   Continuous Infusions:  cefTRIAXone  (ROCEPHIN )  IV 2 g (03/23/24 1854)   Diet: Diet Order             Diet Heart Fluid consistency: Thin  Diet effective now                   Data Reviewed: I have personally reviewed following labs and imaging studies ( see epic result tab) CBC: Recent Labs  Lab 03/22/24 1609 03/23/24 0401 03/24/24 0354  WBC 8.9 6.8 9.2  NEUTROABS  --   --  8.0*  HGB 9.4* 9.7* 9.4*  HCT 31.1* 31.8* 30.5*  MCV 98.1 97.2 96.2  PLT 278 299 311   CMP: Recent Labs  Lab 03/22/24 1609 03/23/24 0401 03/24/24 0354  NA 142 144 138  K 4.5 5.0 4.9  CL 105 103 97*  CO2 28 32 31  GLUCOSE 105* 127* 158*  BUN 22 24* 30*  CREATININE 0.95 0.92 1.41*  CALCIUM  9.4 9.4 9.3  MG  --  2.2 2.4   GFR:  Estimated Creatinine Clearance: 25.1 mL/min (A) (by C-G formula based on SCr of 1.41 mg/dL (H)). No results for input(s): AST, ALT, ALKPHOS, BILITOT, PROT, ALBUMIN in the last 168 hours. No results for input(s): LIPASE, AMYLASE in the last 168 hours. No results for input(s): AMMONIA in the last 168 hours. Coagulation Profile: No results for input(s): INR, PROTIME in the last 168 hours. Unresulted Labs (From admission, onward)     Start     Ordered   03/25/24 0500  Basic metabolic panel with GFR  Daily,   R      03/24/24 0753   03/25/24 0500  CBC  Daily,   R      03/24/24 0753   03/22/24 1953  Legionella Pneumophila Serogp 1 Ur Ag  Once,   R        03/22/24 1952           Antimicrobials/Microbiology: Anti-infectives (From admission, onward)    Start     Dose/Rate Route Frequency Ordered Stop   03/23/24 1845  azithromycin  (ZITHROMAX ) 500 mg in sodium chloride  0.9 % 250 mL IVPB  Status:  Discontinued        500 mg 250 mL/hr over 60 Minutes Intravenous Every 24 hours 03/22/24 2109  03/23/24 1258   03/23/24 1830  cefTRIAXone  (ROCEPHIN ) 2 g in sodium chloride  0.9 % 100 mL IVPB        2 g 200 mL/hr over 30 Minutes Intravenous Every 24 hours 03/22/24 2109 03/27/24 1829   03/23/24 1800  azithromycin  (ZITHROMAX ) tablet 500 mg        500 mg Oral Daily-1800 03/23/24 1258 03/27/24 1759   03/22/24 1800  cefTRIAXone  (ROCEPHIN ) 2 g in sodium chloride  0.9 % 100 mL IVPB        2 g 200 mL/hr over 30 Minutes Intravenous  Once 03/22/24 1752 03/22/24 1857   03/22/24 1800  azithromycin  (ZITHROMAX ) 500 mg in sodium chloride  0.9 % 250 mL IVPB        500 mg 250 mL/hr over 60 Minutes Intravenous  Once 03/22/24 1752 03/22/24 1946         Component Value Date/Time   SDES  02/25/2024 1111    URINE, CLEAN CATCH Performed at [facility], [address], [place], [place] [address]    [facility]  02/25/2024 1111    NONE Performed at [facility], [address], [place], [place] [address]    CULT >=100,000 COLONIES/mL ENTEROBACTER CLOACAE (A) 02/25/2024 1111   REPTSTATUS 02/28/2024 FINAL 02/25/2024 1111    Procedures:    Mennie LAMY, MD [facility] 03/24/2024, 12:38 PM   "

## 2024-03-24 NOTE — Plan of Care (Signed)

## 2024-03-24 NOTE — Consult Note (Addendum)
 "  As below, patient seen and examined.  Briefly she is a 89 year old female with past medical history of orthostatic hypotension, polymyalgia rheumatica, giant cell arteritis, chronic stage IIIa kidney disease, frequent falls, chronic hypoxic respiratory failure on 2 L of home oxygen , mild dementia for evaluation of congestive heart failure.  Patient admitted with worsening dyspnea.  Also complained of cough that is nonproductive and bilateral lower extremity edema.  She has not had chest pain.  Cardiology now asked to evaluate.  Echocardiogram shows ejection fraction 35 to 40%, moderate left atrial enlargement, moderate mitral regurgitation, mild to moderate aortic insufficiency.  Chest x-ray shows moderate left pleural effusion.  Creatinine is 1.41, hemoglobin 9.4.  Electrocardiogram shows sinus rhythm with PACs and left bundle branch block.  1 acute combined systolic/diastolic congestive heart failure-patient remains volume overloaded on examination.  Will resume Lasix  40 mg IV daily and follow renal function.  Notes she has a left pleural effusion and may benefit from thoracentesis.  Will leave to primary care.  Will continue ARB for now.  Can add beta-blockade later.  Will avoid SGLT2 inhibitor as she does not ambulate with increased risk of UTI.  2 acute on chronic stage IIIa kidney disease-creatinine mildly increased.  Will follow closely with diuresis.  3 hypertension-continue irbesartan  at present dose.  Will increase if needed.  Can also add low-dose beta-blocker.  4 liver mass-possible cancer.  Per daughter given patient's overall medical condition and age patient has elected not to have biopsy.  She has a no CODE BLUE.  Redell Shallow, MD   Cardiology Consultation  Patient ID: Shaylee Knippenberg MRN: [id number]; DOB: [birth year]  Admit date: 03/22/2024 Date of Consult: 03/24/2024  PCP:  Wendolyn Jenkins Jansky, MD   [facility] Providers Cardiologist:  None     Patient Profile: Iriel  Ledlow is a 89 y.o. female with a hx of hypertension, orthostatic hypotension, depression, anxiety, history of polymyalgia rheumatica, giant cell arteritis on chronic prednisone , asthma, chronic constipation, hypertriglyceridemia, CKD stage IIIa, frequent falls, restless leg syndrome, chronic hypoxic respiratory failure on 2 L of nasal cannula at baseline, age-related cognitive decline, anemia who is being seen 03/24/2024 for the evaluation of acute on chronic respiratory failure at the request of Dr. Christobal.  History of Present Illness: Ms. Pupo has past medical history as stated above.  She presented to the [facility] emergency department on 03/22/2024 complaining of cough/shortness of breath.  She reports that she was feeling overall fairly well, then experienced a coughing fit in the morning which left her very short of breath.  She is on chronic 2 L of oxygen  via nasal cannula, this was increased to 3 L however when medics arrived to the patient's house they noted her SpO2 was 77% on room air, increased to 4 L with improvement in her symptoms.  Her daughter also noted that the patient had experienced some lower extremity edema.  Relevant workup on the ED includes: proBNP significantly elevated at 23,103, respiratory panel negative, metabolic panel unremarkable upon arrival but creatinine rising with IV Lasix  use 0.95 ? 1.41, CBC shows chronic anemia slightly worse than baseline (baseline hemoglobin 11) presented with hemoglobin 9.4, troponin mildly elevated and flat 96 ? 85.  CXR showed mild pulmonary edema, moderate left pleural effusion, possible LLL consolidation, cardiomegaly.  EKG showed sinus rhythm, LVH, PACs, no significant changes when compared to prior tracings.  Echocardiogram this admission showed: LVEF 35 to 40%, global hypokinesis, mild LVH, normal RV systolic function, moderately dilated  LA, moderate pleural effusion, moderate MR, mild to moderate AR, dilated IVC.  Last echo from 06/2014 showed  LVEF 55 to 60%.   Patient was admitted to medicine service, started on IV antibiotics, IV Lasix  40 mg twice daily, started on irbesartan  37.5 mg daily.  Cardiology was asked to consult in the setting of newly reduced EF.  Patient has not seen cardiology in the past.  Patient is extremely hard of hearing and has baseline dementia which makes obtaining a history difficult from her. She is up eating breakfast when I am in. She continues to have coughing fits while I am in the room. She tells me she thinks her breathing has somewhat improved since she has been admitted. Overall she has some crackles and is wheezing on exam with decreased sounds in the LLL. She remains with some mild pitting edema in bilateral LE.   Past Medical History:  Diagnosis Date   Abdominal bloating    Abnormal LFTs (liver function tests)    Ankle fracture, bimalleolar, closed, right, initial encounter    Anxiety disorder    Arthritis    Asthma    Bimalleolar ankle fracture, right, closed, initial encounter 01/06/2019   Depression    Diverticulosis    Edema    Fatty liver    Fibromyalgia    GERD (gastroesophageal reflux disease)    Giant cell arteritis (HCC)    No biopsy secondary to steroid use   Hx of adenomatous colonic polyps    Hyperlipidemia    Hypertension    IBS (irritable bowel syndrome)    Lumbar radiculitis    Osteoporosis 09/2017   T score -2.6 left femoral neck   Peptic ulcer disease    Skin cancer    shoulder, leg, right eye (? squamous by resection)   Thoracic radiculitis    Thyroid  nodule    removed   Urinary incontinence    Vertigo    Past Surgical History:  Procedure Laterality Date   ABDOMINAL HYSTERECTOMY     BSO   BACK SURGERY  2005   lower back   EYE SURGERY Right 2006   LIVER BIOPSY     fatty liver   NASAL SEPTUM SURGERY     ORIF ANKLE FRACTURE Right 01/11/2019   Procedure: OPEN REDUCTION INTERNAL FIXATION (ORIF) RIGHT BIMALLEOLAR ANKLE FRACTURE;  Surgeon: Jerri Kay HERO,  MD;  Location:  S[facility];  Service: Orthopedics;  Laterality: Right;   right knee surgery  2006   arthroscopy   right leg skin cancer surgery  2014   right shoulder skin cancer excision  2012   SKIN CANCER EXCISION     THYROIDECTOMY      Home Medications:  Prior to Admission medications  Medication Sig Start Date End Date Taking? Authorizing Provider  acetaminophen  (TYLENOL ) 500 MG tablet Take 1 tablet (500 mg total) by mouth 3 (three) times daily. 01/03/24  Yes Regalado, Belkys A, MD  albuterol  (PROVENTIL ) (2.5 MG/3ML) 0.083% nebulizer solution Take 3 mLs (2.5 mg total) by nebulization every 6 (six) hours as needed for wheezing or shortness of breath. Patient taking differently: Take 2.5 mg by nebulization in the morning and at bedtime. 01/07/24  Yes Wendolyn Jenkins Jansky, MD  ARIPiprazole  (ABILIFY ) 2 MG tablet TAKE 1 TABLET BY MOUTH EVERY DAY *NEW PRESCRIPTION REQUEST* Patient taking differently: Take 2 mg by mouth daily. 01/07/24  Yes Wendolyn Jenkins Jansky, MD  ASPIRIN  LOW DOSE 81 MG tablet TAKE 1 TABLET BY MOUTH EVERY DAY *NEW  PRESCRIPTION REQUEST* Patient taking differently: Take 81 mg by mouth daily. 01/07/24  Yes Wendolyn Jenkins Jansky, MD  budesonide  (PULMICORT ) 1 MG/2ML nebulizer solution INHALE THE CONTENTS OF 1 VIAL VIA NEBULIZER TWICE DAILY *NEW PRESCRIPTION REQUEST* Patient taking differently: Take 1 mg by nebulization in the morning and at bedtime. 01/07/24  Yes Wendolyn Jenkins Jansky, MD  buPROPion  (WELLBUTRIN  XL) 150 MG 24 hr tablet Take 1 tablet (150 mg total) by mouth in the morning. 01/07/24  Yes Wendolyn Jenkins Jansky, MD  calcium -vitamin D  (OSCAL WITH D) 500-200 MG-UNIT tablet Take 1 tablet by mouth 3 (three) times daily. Patient taking differently: Take 1 tablet by mouth daily. 01/11/19  Yes Jerri Kay HERO, MD  cephALEXin  (KEFLEX ) 250 MG capsule Take 1 capsule (250 mg total) by mouth daily. Resume after finished with antibiotics from hospitalization Patient taking differently: Take  250 mg by mouth at bedtime. 02/28/24  Yes Tat, Alm, MD  Cholecalciferol  (VITAMIN D ) 50 MCG (2000 UT) tablet Take 2,000 Units by mouth daily. 03/02/24  Yes [provider]  cyanocobalamin  (VITAMIN B12) 1000 MCG/ML injection Inject 1 mL (1,000 mcg total) into the muscle every 30 (thirty) days. 01/07/24  Yes Wendolyn Jenkins Jansky, MD  diazepam  (VALIUM ) 5 MG tablet Take 1 tablet (5 mg total) by mouth See admin instructions. Take 5 mg by mouth in the evening AS NEEDED for restlessness and an additional 5 mg once a day as needed for anxiety Patient taking differently: Take 2.5 mg by mouth daily as needed for anxiety. 01/13/24  Yes Wendolyn Jenkins Jansky, MD  docusate sodium  (COLACE) 100 MG capsule Take 1 capsule (100 mg total) by mouth daily. Patient taking differently: Take 100 mg by mouth at bedtime. 01/07/24  Yes Wendolyn Jenkins Jansky, MD  estradiol (ESTRACE) 0.1 MG/GM vaginal cream Place 1 Applicatorful vaginally daily as needed (burning). 03/30/21  Yes [provider]  folic acid  (FOLVITE ) 1 MG tablet Take 1 tablet (1 mg total) by mouth daily. 03/03/24  Yes Wendolyn Jenkins Jansky, MD  guaiFENesin  (MUCINEX ) 600 MG 12 hr tablet Take 600 mg by mouth 2 (two) times daily.   Yes [provider]  HYDROcodone -acetaminophen  (NORCO/VICODIN) 5-325 MG tablet Take 1 tablet by mouth every 6 (six) hours as needed for moderate pain (pain score 4-6). Patient taking differently: Take 1 tablet by mouth daily as needed for moderate pain (pain score 4-6). 03/03/24  Yes Wendolyn Jenkins Jansky, MD  memantine  (NAMENDA ) 5 MG tablet Take 1 tablet (5 mg total) by mouth in the morning and at bedtime. 01/07/24  Yes Wendolyn Jenkins Jansky, MD  omega-3 acid ethyl esters (LOVAZA ) 1 g capsule Take 2 capsules (2 g total) by mouth 2 (two) times daily. 01/22/24  Yes Wendolyn Jenkins Jansky, MD  omeprazole  (PRILOSEC) 40 MG capsule TAKE ONE (1) CAPSULE BY MOUTH TWICE DAILY *NEW PRESCRIPTION REQUEST* 01/07/24  Yes Wendolyn Jenkins Jansky, MD  ondansetron   (ZOFRAN -ODT) 8 MG disintegrating tablet Take 1 tablet (8 mg total) by mouth every 8 (eight) hours as needed for nausea or vomiting. PLACE 1 TABLET ON TONGUE EVERY 8 HOURS AS NEEDED FOR NAUSEA OR VOMITING Patient taking differently: Take 8 mg by mouth daily as needed for nausea or vomiting. 01/13/24  Yes Wendolyn Jenkins Jansky, MD  polyethylene glycol (MIRALAX  / GLYCOLAX ) 17 g packet Take 17 g by mouth daily. 01/13/24  Yes Wendolyn Jenkins Jansky, MD  predniSONE  (DELTASONE ) 5 MG tablet Take 1 tablet (5 mg total) by mouth at bedtime. Resume after finished with prednisone  taper  from hospitalization Patient taking differently: Take 5 mg by mouth daily. 02/28/24  Yes TatAlm, MD  Probiotic CHEW Chew 1 each by mouth every other day. Patient taking differently: Chew 1 each by mouth every Monday, Wednesday, and Friday. 01/07/24  Yes Wendolyn Jenkins Jansky, MD  rOPINIRole  (REQUIP ) 1 MG tablet Take 1 tablet (1 mg total) by mouth as directed. TAKE 1 TABLET IN THE MORNING AND 2 TABLETS IN THE EVENING 01/22/24  Yes Wendolyn Jenkins Jansky, MD  sertraline  (ZOLOFT ) 50 MG tablet Take 1 tablet (50 mg total) by mouth in the morning. 01/07/24  Yes Wendolyn Jenkins Jansky, MD  SYSTANE 0.4-0.3 % SOLN Place 1 drop into both eyes 4 (four) times daily as needed (for dryness). Patient taking differently: Place 1 drop into both eyes daily as needed (for dryness). 01/13/24  Yes Wendolyn Jenkins Jansky, MD  Tiotropium Bromide  (SPIRIVA  RESPIMAT) 2.5 MCG/ACT AERS Inhale 2 Inhalations into the lungs daily at 12 noon. 01/22/24  Yes Wendolyn Jenkins Jansky, MD  tocilizumab  4 mg/kg in sodium chloride  0.9 % Inject 280 mg into the vein every 28 (twenty-eight) days. 12/03/16  Yes [provider]  allopurinol (ZYLOPRIM) 100 MG tablet Take 100 mg by mouth daily. Patient not taking: Reported on 03/23/2024    [provider]  amLODipine  (NORVASC ) 2.5 MG tablet Take 2.5 mg by mouth daily. Patient not taking: Reported on 03/23/2024    [provider]  ciprofloxacin   (CIPRO ) 500 MG tablet Take 1 tablet (500 mg total) by mouth 2 (two) times daily. Patient not taking: Reported on 03/23/2024 02/28/24   Evonnie Alm, MD  olmesartan  (BENICAR ) 40 MG tablet Take 40 mg by mouth daily. Patient not taking: Reported on 03/23/2024    [provider]  predniSONE  (DELTASONE ) 10 MG tablet Take 6 tablets (60 mg total) by mouth daily with breakfast. And decrease by one tablet daily Patient not taking: Reported on 03/23/2024 02/29/24   Evonnie Alm, MD    Scheduled Meds:  arformoterol   15 mcg Nebulization BID   ARIPiprazole   2 mg Oral Daily   aspirin  EC  81 mg Oral Daily   azithromycin   500 mg Oral q1800   budesonide  (PULMICORT ) nebulizer solution  0.5 mg Nebulization BID   buPROPion   150 mg Oral q AM   calcium -vitamin D   1 tablet Oral Q breakfast   cholecalciferol   2,000 Units Oral Daily   enoxaparin  (LOVENOX ) injection  40 mg Subcutaneous Q24H   folic acid   1 mg Oral Daily   guaiFENesin   600 mg Oral BID   ipratropium-albuterol   3 mL Nebulization TID   irbesartan   37.5 mg Oral Daily   loratadine   10 mg Oral Daily   memantine   5 mg Oral BID   omega-3 acid ethyl esters  2 capsule Oral BID   pantoprazole   40 mg Oral Daily   predniSONE   40 mg Oral QAC breakfast   rOPINIRole   1 mg Oral Daily   rOPINIRole   2 mg Oral QHS   sertraline   50 mg Oral q AM   sodium chloride  flush  3 mL Intravenous Q12H   Continuous Infusions:  cefTRIAXone  (ROCEPHIN )  IV 2 g (03/23/24 1854)   PRN Meds: acetaminophen  **OR** acetaminophen , artificial tears, bisacodyl , diazepam , diazepam , guaiFENesin -dextromethorphan , HYDROcodone -acetaminophen , ipratropium-albuterol , ondansetron  **OR** ondansetron  (ZOFRAN ) IV, senna-docusate  Allergies:   Allergies[1]  Social History:   Social History   Socioeconomic History   Marital status: Widowed    Spouse name: Not on file   Number of children:  3   Years of education: Not on file   Highest education level: Not on file  Occupational History    Occupation: Retired Chief Financial Officer  Tobacco Use   Smoking status: Never   Smokeless tobacco: Never  Vaping Use   Vaping status: Never Used  Substance and Sexual Activity   Alcohol  use: No   Drug use: No   Sexual activity: Not Currently    Comment: intercourser age 89, less than 5 secxual partners  Other Topics Concern   Not on file  Social History Narrative      children Darina Kuba and Anita-pt lives w/Gary, but others are neighbors.   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   Patient takes a daily vitamin   She wears her seat belt. Exercises greater than 3 times a week.   Wears 2 hearing aids. No dentures.   Sometimes requires assistive device for walking, either a walker or wheelchair.   There is a smoke detector in her home. There are firearms in her home. She feels safe in her relationships.   Right handed   Social Drivers of Health   Tobacco Use: Low Risk (03/23/2024)   Patient History    Smoking Tobacco Use: Never    Smokeless Tobacco Use: Never    Passive Exposure: Not on file  Financial Resource Strain: Low Risk (08/26/2023)   Overall Financial Resource Strain (CARDIA)    Difficulty of Paying Living Expenses: Not hard at all  Food Insecurity: No Food Insecurity (03/23/2024)   Epic    Worried About Programme Researcher, Broadcasting/film/video in the Last Year: Never true    Ran Out of Food in the Last Year: Never true  Transportation Needs: No Transportation Needs (03/23/2024)   Epic    Lack of Transportation (Medical): No    Lack of Transportation (Non-Medical): No  Physical Activity: Insufficiently Active (08/26/2023)   Exercise Vital Sign    Days of Exercise per Week: 3 days    Minutes of Exercise per Session: 20 min  Stress: No Stress Concern Present (08/26/2023)   [employer] of Occupational Health - Occupational Stress Questionnaire    Feeling of Stress : Not at all  Social Connections: Socially Isolated (03/23/2024)   Social Connection and Isolation Panel    Frequency of  Communication with Friends and Family: Twice a week    Frequency of Social Gatherings with Friends and Family: Twice a week    Attends Religious Services: Never    Database Administrator or Organizations: No    Attends Banker Meetings: Never    Marital Status: Widowed  Intimate Partner Violence: Not At Risk (03/23/2024)   Epic    Fear of Current or Ex-Partner: No    Emotionally Abused: No    Physically Abused: No    Sexually Abused: No  Depression (PHQ2-9): Medium Risk (09/30/2023)   Depression (PHQ2-9)    PHQ-2 Score: 7  Alcohol  Screen: Low Risk (08/26/2023)   Alcohol  Screen    Last Alcohol  Screening Score (AUDIT): 0  Housing: Low Risk (03/23/2024)   Epic    Unable to Pay for Housing in the Last Year: No    Number of Times Moved in the Last Year: 0    Homeless in the Last Year: No  Utilities: Not At Risk (03/23/2024)   Epic    Threatened with loss of utilities: No  Health Literacy: Adequate Health Literacy (08/26/2023)   B1300 Health Literacy    Frequency of need for help  with medical instructions: Never    Family History:   Family History  Problem Relation Age of Onset   Colon polyps Maternal Aunt    Diabetes Father    Heart disease Father    Heart disease Mother    Lung disease Mother    COPD Mother    Lung cancer Brother    Cancer Brother        lung   COPD Brother    Stroke Brother    Lung cancer Sister    COPD Sister    Kidney disease Other        niece-mat side   Arthritis Other    Diabetes Son    Arthritis Daughter    Colon cancer Neg Hx     ROS:  Please see the history of present illness.  All other ROS reviewed and negative.     Physical Exam/Data: Vitals:   03/23/24 2128 03/23/24 2205 03/24/24 0408 03/24/24 0734  BP:   (!) 157/68   Pulse:   79   Resp:   18   Temp:   98 F (36.7 C)   TempSrc:      SpO2: 97% 98% 100% 99%  Weight:      Height:        Intake/Output Summary (Last 24 hours) at 03/24/2024 0936 Last data filed at 03/23/2024  2300 Gross per 24 hour  Intake 723 ml  Output 600 ml  Net 123 ml      03/23/2024    4:54 AM 03/22/2024    9:11 PM 03/03/2024    2:30 PM  Last 3 Weights  Weight (lbs) 170 lb 10.2 oz 170 lb 10.2 oz 164 lb  Weight (kg) 77.4 kg 77.4 kg 74.39 kg     Body mass index is 30.23 kg/m.   General:  elderly female, in no acute distress, presently on 2 L via [place] HEENT: normal Vascular: Distal pulses 2+ bilaterally Cardiac:  normal S1, S2; RRR; no murmur  Lungs:  bilateral crackles, wheezing noted, decreased sounds in LLL  Abd: soft, nontender Ext: 1+ LE edema Musculoskeletal:  No deformities Skin: warm and dry  Neuro:  no focal abnormalities noted Psych:  Normal affect   EKG:  The EKG was personally reviewed and demonstrates:  sinus rhythm, HR 99, LVH, no acute changes when compared to prior tracings  Telemetry:  Telemetry was personally reviewed and demonstrates:  sinus rhythm, HR 80s, frequent ectopy   Relevant CV Studies:  Echocardiogram, 03/23/2024 Left ventricular ejection fraction, by estimation, is 35 to 40% . The left ventricle has moderately decreased function. The left ventricle demonstrates global hypokinesis. There is mild concentric left ventricular hypertrophy. Left ventricular diastolic parameters are indeterminate.  Right ventricular systolic function is normal. The right ventricular size is normal. Left atrial size was moderately dilated.  Moderate pleural effusion.  The mitral valve is degenerative. Moderate mitral valve regurgitation. No evidence of mitral stenosis.  The aortic valve is tricuspid. Aortic valve regurgitation is mild to moderate. No aortic stenosis is present.  The inferior vena cava is dilated in size with < 50% respiratory variability, suggesting right atrial pressure of 15 mmHg.  Lexiscan , 08/11/2014 No evidence of ischemia or scar by perfusion imaging. EKG shows no ischemic changes. LV systolic function is normal. No segmental wall motion  abnormalities  Laboratory Data: High Sensitivity Troponin:  No results for input(s): TROPONINIHS in the last 720 hours.  Recent Labs  Lab 03/22/24 1609 03/22/24 1842  TRNPT 96* 85*  Chemistry Recent Labs  Lab 03/22/24 1609 03/23/24 0401 03/24/24 0354  NA 142 144 138  K 4.5 5.0 4.9  CL 105 103 97*  CO2 28 32 31  GLUCOSE 105* 127* 158*  BUN 22 24* 30*  CREATININE 0.95 0.92 1.41*  CALCIUM  9.4 9.4 9.3  MG  --  2.2 2.4  GFRNONAA 56* 58* 35*  ANIONGAP 9 8 9     No results for input(s): PROT, ALBUMIN, AST, ALT, ALKPHOS, BILITOT in the last 168 hours. Lipids No results for input(s): CHOL, TRIG, HDL, LABVLDL, LDLCALC, CHOLHDL in the last 168 hours.  Hematology Recent Labs  Lab 03/22/24 1609 03/23/24 0401 03/24/24 0354  WBC 8.9 6.8 9.2  RBC 3.17* 3.27* 3.17*  HGB 9.4* 9.7* 9.4*  HCT 31.1* 31.8* 30.5*  MCV 98.1 97.2 96.2  MCH 29.7 29.7 29.7  MCHC 30.2 30.5 30.8  RDW 14.6 14.6 14.5  PLT 278 299 311   Thyroid  No results for input(s): TSH, FREET4 in the last 168 hours.  BNP Recent Labs  Lab 03/22/24 1609  PROBNP 23,103.0*    DDimer No results for input(s): DDIMER in the last 168 hours.  Radiology/Studies:  DG Chest 2 View Result Date: 03/24/2024 CLINICAL DATA:  Hypoxia. EXAM: CHEST - 2 VIEW COMPARISON:  03/22/2024 FINDINGS: Moderate left and small right pleural effusions show no significant change. Infiltrate or atelectasis in the left lower lobe is also stable. Pulmonary hyperinflation and mild cardiomegaly again noted. IMPRESSION: No significant change in moderate left and small right pleural effusions. Stable left lower lobe infiltrate or atelectasis. Electronically Signed   By: Norleen DELENA Kil M.D.   On: 03/24/2024 09:03   ECHOCARDIOGRAM COMPLETE Result Date: 03/23/2024    ECHOCARDIOGRAM REPORT   Patient Name:   LUDWIKA RODD Date of Exam: 03/23/2024 Medical Rec #:  [id number]     Height:       63.0 in Accession #:    [id number]    Weight:        170.6 lb Date of Birth:  01-07-1930     BSA:          1.807 m Patient Age:    89 years      BP:           157/71 mmHg Patient Gender: F             HR:           142 bpm. Exam Location:  Inpatient Procedure: Cardiac Doppler, Color Doppler and 2D Echo (Both Spectral and Color            Flow Doppler were utilized during procedure). Indications:    CHF  History:        Patient has no prior history of Echocardiogram examinations.                 Risk Factors:Hypertension.  Sonographer:    Philomena Daring Referring Phys: [id number] VISHAL R PATEL IMPRESSIONS  1. Left ventricular ejection fraction, by estimation, is 35 to 40%. The left ventricle has moderately decreased function. The left ventricle demonstrates global hypokinesis. There is mild concentric left ventricular hypertrophy. Left ventricular diastolic parameters are indeterminate.  2. Right ventricular systolic function is normal. The right ventricular size is normal.  3. Left atrial size was moderately dilated.  4. Moderate pleural effusion.  5. The mitral valve is degenerative. Moderate mitral valve regurgitation. No evidence of mitral stenosis.  6. The aortic valve is tricuspid. Aortic valve regurgitation is mild to moderate.  No aortic stenosis is present.  7. The inferior vena cava is dilated in size with <50% respiratory variability, suggesting right atrial pressure of 15 mmHg. Comparison(s): No prior Echocardiogram. FINDINGS  Left Ventricle: Left ventricular ejection fraction, by estimation, is 35 to 40%. The left ventricle has moderately decreased function. The left ventricle demonstrates global hypokinesis. The left ventricular internal cavity size was normal in size. There is mild concentric left ventricular hypertrophy. Left ventricular diastolic parameters are indeterminate. Right Ventricle: The right ventricular size is normal. No increase in right ventricular wall thickness. Right ventricular systolic function is normal. Left Atrium: Left atrial  size was moderately dilated. Right Atrium: Right atrial size was normal in size. Pericardium: There is no evidence of pericardial effusion. Mitral Valve: The mitral valve is degenerative in appearance. Moderate mitral valve regurgitation. No evidence of mitral valve stenosis. Tricuspid Valve: The tricuspid valve is normal in structure. Tricuspid valve regurgitation is not demonstrated. No evidence of tricuspid stenosis. Aortic Valve: The aortic valve is tricuspid. Aortic valve regurgitation is mild to moderate. Aortic regurgitation PHT measures 252 msec. No aortic stenosis is present. Pulmonic Valve: The pulmonic valve was normal in structure. Pulmonic valve regurgitation is not visualized. No evidence of pulmonic stenosis. Aorta: The aortic root and ascending aorta are structurally normal, with no evidence of dilitation. Venous: The inferior vena cava is dilated in size with less than 50% respiratory variability, suggesting right atrial pressure of 15 mmHg. IAS/Shunts: No atrial level shunt detected by color flow Doppler. Additional Comments: There is a moderate pleural effusion.  LEFT VENTRICLE PLAX 2D LVIDd:         4.80 cm      Diastology LVIDs:         3.60 cm      LV e' medial:    3.59 cm/s LV PW:         1.10 cm      LV E/e' medial:  40.9 LV IVS:        1.10 cm      LV e' lateral:   5.66 cm/s LVOT diam:     1.80 cm      LV E/e' lateral: 26.0 LV SV:         40 LV SV Index:   22 LVOT Area:     2.54 cm  LV Volumes (MOD) LV vol d, MOD A2C: 100.0 ml LV vol d, MOD A4C: 108.0 ml LV vol s, MOD A2C: 52.2 ml LV vol s, MOD A4C: 62.9 ml LV SV MOD A2C:     47.8 ml LV SV MOD A4C:     108.0 ml LV SV MOD BP:      47.1 ml RIGHT VENTRICLE             IVC RV Basal diam:  3.10 cm     IVC diam: 2.00 cm RV Mid diam:    2.00 cm RV S prime:     10.30 cm/s TAPSE (M-mode): 1.6 cm LEFT ATRIUM             Index        RIGHT ATRIUM           Index LA diam:        4.40 cm 2.43 cm/m   RA Area:     18.90 cm LA Vol (A2C):   75.3 ml 41.66  ml/m  RA Volume:   51.30 ml  28.38 ml/m LA Vol (A4C):   80.4 ml 44.48 ml/m LA  Biplane Vol: 80.7 ml 44.65 ml/m  AORTIC VALVE LVOT Vmax:   87.70 cm/s LVOT Vmean:  58.000 cm/s LVOT VTI:    0.156 m AI PHT:      252 msec  AORTA Ao Root diam: 2.40 cm Ao Asc diam:  2.80 cm MITRAL VALVE                TRICUSPID VALVE MV Area (PHT): 5.23 cm     TR Peak grad:   6.4 mmHg MV Decel Time: 145 msec     TR Vmax:        126.00 cm/s MR Peak grad: 122.8 mmHg MR Mean grad: 81.0 mmHg     SHUNTS MR Vmax:      554.00 cm/s   Systemic VTI:  0.16 m MR Vmean:     431.0 cm/s    Systemic Diam: 1.80 cm MV E velocity: 147.00 cm/s MV A velocity: 130.00 cm/s MV E/A ratio:  1.13 Stanly Leavens MD Electronically signed by Stanly Leavens MD Signature Date/Time: 03/23/2024/12:25:10 PM    Final    DG Chest 2 View Result Date: 03/22/2024 EXAM: 2 VIEW(S) XRAY OF THE CHEST 03/22/2024 04:55:00 PM COMPARISON: 02/25/2024 CLINICAL HISTORY: SOB FINDINGS: LUNGS AND PLEURA: Mild pulmonary edema. Possible left lower lobe consolidation. Moderate left pleural effusion. No pneumothorax. HEART AND MEDIASTINUM: Cardiomegaly. Calcified aorta. BONES AND SOFT TISSUES: No acute osseous abnormality. IMPRESSION: 1. Mild pulmonary edema. 2. Moderate left pleural effusion. 3. Possible left lower lobe consolidation. 4. Cardiomegaly. Electronically signed by: Greig Pique MD 03/22/2024 05:44 PM EST RP Workstation: [id number]   Assessment and Plan:  Newly reduced EF, LVEF 35-40% Patient presented with worsening hypoxic respiratory failure proBNP elevated 23,103 Started on IV Lasix  with rising creatinine, discontinued Creatinine 0.95 ? 1.41  CXR showed mild pulmonary edema, moderate left pleural effusion, possible LLL consolidation, cardiomegaly Echo: LVEF 35 to 40%, GK, mild LVH, normal RV systolic function, moderate MR, mild to moderate AR, dilated IVC UOP of 1.3 L yesterday Patient with crackles, decreased breath sounds in LLL on exam, 1+ LE  edema She tells me that she feels slightly better than when she came in  She is very HOH and has some baseline dementia which makes her unreliable  Presently on irbesartan  37.5 mg daily IV diuresis was stopped due to rising creatinine, will discus with MD regarding additional therapy  Suspect her shortness of breath is likely multifactorial, wonder if she would benefit from thoracentesis this admission  Hypertension  SBP remains in 150s  Presently on irbesartan  37.5 mg daily, may need to hold if renal function does not improve  Consider adding amlodipine  for further BP control without affecting renal function  IV diuresis was stopped due to rising creatinine  Per primary LLL consolidation, suspect PNA Acute on chronic hypoxic respiratory failure Asthma Polymyalgia rheumatica Giant cell arteritis CKD stage IIIa Chronic anemia Depression Anxiety Dementia Restless leg syndrome Incidental CT's findings  Risk Assessment/Risk Scores:      [place]  Heart Association (NYHA) Functional Class NYHA Class II    For questions or updates, please contact [facility] Please consult [email or web address] for contact info under   Signed, Waddell DELENA Donath, PA-C  03/24/2024 9:36 AM     [1]  Allergies Allergen Reactions   Oxycodone  Itching and Other (See Comments)    Hallucinations   Statins Other (See Comments)    Myalgia    Advair Diskus [Fluticasone -Salmeterol] Other (See Comments)    Mouth sores   Atorvastatin  Other (See  Comments)    Severe headache   Sulfonamide Derivatives Rash   "

## 2024-03-25 DIAGNOSIS — J9621 Acute and chronic respiratory failure with hypoxia: Secondary | ICD-10-CM | POA: Diagnosis not present

## 2024-03-25 DIAGNOSIS — L8990 Pressure ulcer of unspecified site, unspecified stage: Secondary | ICD-10-CM | POA: Insufficient documentation

## 2024-03-25 DIAGNOSIS — I5043 Acute on chronic combined systolic (congestive) and diastolic (congestive) heart failure: Secondary | ICD-10-CM

## 2024-03-25 LAB — CBC
HCT: 31.1 % — ABNORMAL LOW (ref 36.0–46.0)
Hemoglobin: 9.8 g/dL — ABNORMAL LOW (ref 12.0–15.0)
MCH: 29.6 pg (ref 26.0–34.0)
MCHC: 31.5 g/dL (ref 30.0–36.0)
MCV: 94 fL (ref 80.0–100.0)
Platelets: 327 K/uL (ref 150–400)
RBC: 3.31 MIL/uL — ABNORMAL LOW (ref 3.87–5.11)
RDW: 14.3 % (ref 11.5–15.5)
WBC: 9.9 K/uL (ref 4.0–10.5)
nRBC: 0 % (ref 0.0–0.2)

## 2024-03-25 LAB — BASIC METABOLIC PANEL WITH GFR
Anion gap: 6 (ref 5–15)
BUN: 29 mg/dL — ABNORMAL HIGH (ref 8–23)
CO2: 39 mmol/L — ABNORMAL HIGH (ref 22–32)
Calcium: 9.4 mg/dL (ref 8.9–10.3)
Chloride: 95 mmol/L — ABNORMAL LOW (ref 98–111)
Creatinine, Ser: 1 mg/dL (ref 0.44–1.00)
GFR, Estimated: 53 mL/min — ABNORMAL LOW
Glucose, Bld: 123 mg/dL — ABNORMAL HIGH (ref 70–99)
Potassium: 4.3 mmol/L (ref 3.5–5.1)
Sodium: 140 mmol/L (ref 135–145)

## 2024-03-25 MED ORDER — CARVEDILOL 3.125 MG PO TABS
3.1250 mg | ORAL_TABLET | Freq: Two times a day (BID) | ORAL | Status: DC
  Administered 2024-03-25 – 2024-03-26 (×2): 3.125 mg via ORAL
  Filled 2024-03-25 (×2): qty 1

## 2024-03-25 MED ORDER — FERROUS SULFATE 325 (65 FE) MG PO TABS
325.0000 mg | ORAL_TABLET | Freq: Every day | ORAL | Status: DC
  Administered 2024-03-25 – 2024-03-29 (×5): 325 mg via ORAL
  Filled 2024-03-25 (×5): qty 1

## 2024-03-25 MED ORDER — ENOXAPARIN SODIUM 40 MG/0.4ML IJ SOSY
40.0000 mg | PREFILLED_SYRINGE | INTRAMUSCULAR | Status: DC
  Administered 2024-03-25 – 2024-03-27 (×3): 40 mg via SUBCUTANEOUS
  Filled 2024-03-25 (×3): qty 0.4

## 2024-03-25 MED ORDER — SACUBITRIL-VALSARTAN 24-26 MG PO TABS
1.0000 | ORAL_TABLET | Freq: Two times a day (BID) | ORAL | Status: DC
  Administered 2024-03-25 – 2024-03-29 (×8): 1 via ORAL
  Filled 2024-03-25 (×8): qty 1

## 2024-03-25 MED ORDER — SPIRONOLACTONE 12.5 MG HALF TABLET
12.5000 mg | ORAL_TABLET | Freq: Every day | ORAL | Status: DC
  Administered 2024-03-25 – 2024-03-29 (×5): 12.5 mg via ORAL
  Filled 2024-03-25 (×5): qty 1

## 2024-03-25 NOTE — Progress Notes (Addendum)
"   As below, patient seen and examined.  She states her dyspnea is improving.  She remains mildly volume overloaded on examination.  Will continue Lasix  at present dose.  Add spironolactone  12.5 mg daily.  Discontinue irbesartan  and treat with Entresto  24/26 twice daily.  Add carvedilol  3.125 mg twice daily.  Likely can be discharged in the next 24 to 48 hours.  Continue to follow renal function.  May need thoracentesis.  Will leave to primary service.  [name]    Progress Note  Patient Name: [name] Date of Encounter: 03/25/2024 [facility] Cardiologist: None   Interval Summary   Reports feeling much better today Has to be oriented to place this morning  Tells me her breathing feels much improved Good urine output charted   Vital Signs Vitals:   03/25/24 0547 03/25/24 0548 03/25/24 0745 03/25/24 0748  BP: (!) 147/75     Pulse: 86     Resp: 19     Temp: 98.2 F (36.8 C)     TempSrc: Oral     SpO2: 100%  100% 100%  Weight:  73.1 kg    Height:        Intake/Output Summary (Last 24 hours) at 03/25/2024 0903 Last data filed at 03/25/2024 0454 Gross per 24 hour  Intake 883 ml  Output 1900 ml  Net -1017 ml      03/25/2024    5:48 AM 03/23/2024    4:54 AM 03/22/2024    9:11 PM  Last 3 Weights  Weight (lbs) 161 lb 2.5 oz 170 lb 10.2 oz 170 lb 10.2 oz  Weight (kg) 73.1 kg 77.4 kg 77.4 kg     Telemetry/ECG  Sinus, frequent ectopy - Personally Reviewed  Physical Exam  GEN: No acute distress.   Neck: No JVD Cardiac: RRR, no murmurs, rubs, or gallops.  Respiratory: coughing, wheezing noted, decreased breath sounds GI: Soft, nontender, non-distended  MS: no edema  Assessment & Plan   Newly reduced EF, LVEF 35-40% Patient presented with worsening hypoxic respiratory failure proBNP elevated 23,103 CXR showed mild pulmonary edema, moderate left pleural effusion, possible LLL consolidation, cardiomegaly Echo: LVEF 35 to 40%, GK, mild LVH, normal RV systolic  function, moderate MR, mild to moderate AR, dilated IVC UOP of 2.6 L yesterday, net -2.1 L this admission  Creatinine 0.95 ? 1.41 ? 1.00  She is very HOH and has some baseline dementia which makes her unreliable  Presently on irbesartan  37.5 mg daily Avoid SGLT2i as patient does not ambulate Currently on IV Lasix  40 mg daily, good response, continue today  May benefit from thoracentesis this admission, defer to primary    Hypertension  SBP remains in 140s  Continue irbesartan  37.5 mg daily, consider increasing  Currently on IV Lasix  40 mg daily -- continue for today  May be able to add on beta-blocker soon   Per primary LLL consolidation, suspect PNA Acute on chronic hypoxic respiratory failure Liver mass, possible malignancy  Asthma Polymyalgia rheumatica Giant cell arteritis CKD stage IIIa Chronic anemia Depression Anxiety Dementia Restless leg syndrome  For questions or updates, please contact [facility] Please consult [email or web address] for contact info under       Signed, Waddell DELENA Donath, PA-C   "

## 2024-03-25 NOTE — Progress Notes (Addendum)
 " PROGRESS NOTE [name]  FMW:[id number] DOB: 1929/10/28 DOA: 03/22/2024 PCP: Wendolyn Jenkins Jansky, MD  Brief Narrative/Hospital Course: [name] is a 89 y.o. female with PMH of for asthma, chronic respiratory failure with hypoxia in [address] at baseline, GCA/PMR on Actemra  and chronic prednisone  5 mg daily, CKD stage IIIa, HTN, RLS, B12 deficiency, depression/anxiety, dementia, liver mass/transverse colon thickening (presumed neoplasm/metastatic disease -biopsy not pursued) who is admitted with acute on chronic hypoxic respiratory failure likely secondary to new diagnosis of congestive heart failure after presenting on 03/22/2024 with chronic cough shortness of breath, productive cough-d proBNP 23,103, troponin T 96 >85, WBC 8.9, hemoglobin 9.4, platelets 278, sodium 142, potassium 4.5, bicarb 28, BUN 22, creatinine 0.95, serum glucose 105.  X-ray showed pulmonary edema moderate left pleural effusion possible LLL consolidation.\ 1/6-TTE: EF 35-40%, GHK.  And cardiology was consulted  Subjective: Seen and examined Patient reports her breathing is much improved this morning, appears pleasant comfortable Overnight no fever vital stable, on 2l [place]- home setting-creatinine improved 1.4> 1 CBC with stable hemoglobin 9.8 Still having crackles and wet cough  Assessment and plan:  Acute systolic CHF-new diagnosis LLL consilidation/acute pulmonary edema/left pleural effusion: Echo-low EF 35-40%, cardiology has been consulted. Son passed away last 04-17-2024- ? If stress induced  Cont  iv Lasix  40 IV, GDMT- Arbs-per cardiology. For pleural effusion will order repeat chest x-ray in the morning if persistent will order thoracentesis  Yeah cont to monitor daily I/O,weight, electrolytes and net balance.Keep on  salt/fluid restricted diet and monitor in tele. Net IO Since Admission: -1,884 mL [03/25/24 0957] Improving as below [facility] Weights   03/22/24 2111 03/23/24 0454 03/25/24 0548  Weight: 77.4 kg 77.4 kg  73.1 kg    Recent Labs  Lab 03/22/24 1609 03/23/24 0401 03/24/24 0354 03/25/24 0414  PROBNP 23,103.0*  --   --   --   BUN 22 24* 30* 29*  CREATININE 0.95 0.92 1.41* 1.00  K 4.5 5.0 4.9 4.3  MG  --  2.2 2.4  --     Acute on chronic respiratory failure with hypoxia on 2l [place] at baseline: Community-acquired pneumonia Immunocompromise status: Continue empiric antibiotics-ceftriaxone /azithromycin -strep pneumoniae/Legionella negative.on home oxygen  setting.    Hypertension: BP controlled. Holding home Norvasc , continue Avapro  as GDMT, Cont ASA.   Asthma/?? Acute asthma exacerbation: Continue Brovana  Pulmicort , bronchodilators and steroid taper along with antitussives  Giant cell arteritis/polymyalgia rheumatica: Chronically on Prednisone  5 mg,on Actemra  iv q28d- last one in Nov- was held due to being on antibiotics-next appointment in February. Continue steroid as above.   AKI on CKD stage IIIa Creatinine improved to baseline.  Monitor while on diuretics  Recent Labs    12/30/23 1308 01/01/24 0517 01/02/24 0835 01/03/24 0907 02/25/24 1142 02/26/24 0447 03/22/24 1609 03/23/24 0401 03/24/24 0354 03/25/24 0414  BUN 18 23 28* 29* 15 18 22  24* 30* 29*  CREATININE 1.10* 1.27* 1.43* 1.03* 0.82 0.90 0.95 0.92 1.41* 1.00  CO2 28 25 28 27 29 28 28 32 31  39*  K 4.3 4.9 5.0 4.9 4.4 4.6 4.5 5.0 4.9 4.3    Normocytic anemia Iron deficiency anemia, no acute blood loss noticed hemoglobin stable, added oral iron supplement. Anemia panel done.  Dementia Anxiety/Depression RLS: Continue home regimen Abilify , bupropion , sertraline , Requip , PRN Valium  along with her Namenda , keep in delirium precaution fall precaution   Liver mass/circumferential wall thickening of the transverse colon: Noted on prior CT imaging of 12/30/2023.Findings on CT concerning for primary neoplasm or metastatic disease.  OF NOTE patient and family noted to have deferred workup as she would not want to pursue any  oncological treatment.  Class I Obesity w/ Body mass index is 28.55 kg/m.: Will benefit with PCP follow-up, weight loss  Mobility: PT Orders: Active PT Follow up Rec:     DVT prophylaxis: enoxaparin  (LOVENOX ) injection 30 mg Start: 03/24/24 2000 Code Status:   Code Status: Limited: Do not attempt resuscitation (DNR) -DNR-LIMITED -Do Not Intubate/DNI  Family Communication: plan of care discussed with patient/ daughter not at bedside Patient status is: Remains hospitalized because of severity of illness Level of care: Telemetry   Dispo: The patient is from: HOME w/ son who passes last saturday            Anticipated disposition: TBD. Objective: Vitals last 24 hrs: Vitals:   03/25/24 0547 03/25/24 0548 03/25/24 0745 03/25/24 0748  BP: (!) 147/75     Pulse: 86     Resp: 19     Temp: 98.2 F (36.8 C)     TempSrc: Oral     SpO2: 100%  100% 100%  Weight:  73.1 kg    Height:        Physical Examination: General exam: AAO, hard of hearing HEENT:Oral mucosa moist, Ear/Nose WNL grossly Respiratory system: Bilaterally basal crackles ,no use of accessory muscle Cardiovascular system: S1 & S2 +, No JVD. Gastrointestinal system: Abdomen soft,NT,ND, BS+ Nervous System: Alert, awake, moving all extremities,and following commands. Extremities: extremities warm, leg edema +-overall improves Skin: Warm, no rashes MSK: Normal muscle bulk,tone, power   Medications reviewed:  Scheduled Meds:  arformoterol   15 mcg Nebulization BID   ARIPiprazole   2 mg Oral Daily   aspirin  EC  81 mg Oral Daily   azithromycin   500 mg Oral q1800   budesonide  (PULMICORT ) nebulizer solution  0.5 mg Nebulization BID   buPROPion   150 mg Oral q AM   calcium -vitamin D   1 tablet Oral Q breakfast   cholecalciferol   2,000 Units Oral Daily   enoxaparin  (LOVENOX ) injection  30 mg Subcutaneous Q24H   ferrous sulfate   325 mg Oral Q breakfast   folic acid   1 mg Oral Daily   furosemide   40 mg Intravenous Daily    guaiFENesin   600 mg Oral BID   ipratropium-albuterol   3 mL Nebulization TID   irbesartan   37.5 mg Oral Daily   loratadine   10 mg Oral Daily   memantine   5 mg Oral BID   omega-3 acid ethyl esters  2 capsule Oral BID   pantoprazole   40 mg Oral Daily   predniSONE   40 mg Oral QAC breakfast   rOPINIRole   1 mg Oral Daily   rOPINIRole   2 mg Oral QHS   sertraline   50 mg Oral q AM   sodium chloride  flush  3 mL Intravenous Q12H   Continuous Infusions:  cefTRIAXone  (ROCEPHIN )  IV 200 mL/hr at 03/24/24 1817   Diet: Diet Order             Diet Heart Fluid consistency: Thin  Diet effective now                   Data Reviewed: I have personally reviewed following labs and imaging studies ( see epic result tab) CBC: Recent Labs  Lab 03/22/24 1609 03/23/24 0401 03/24/24 0354 03/25/24 0414  WBC 8.9 6.8 9.2 9.9  NEUTROABS  --   --  8.0*  --   HGB 9.4* 9.7* 9.4* 9.8*  HCT 31.1* 31.8*  30.5* 31.1*  MCV 98.1 97.2 96.2 94.0  PLT 278 299 311 327   CMP: Recent Labs  Lab 03/22/24 1609 03/23/24 0401 03/24/24 0354 03/25/24 0414  NA 142 144 138 140  K 4.5 5.0 4.9 4.3  CL 105 103 97* 95*  CO2 28 32 31 39*  GLUCOSE 105* 127* 158* 123*  BUN 22 24* 30* 29*  CREATININE 0.95 0.92 1.41* 1.00  CALCIUM  9.4 9.4 9.3 9.4  MG  --  2.2 2.4  --    GFR: Estimated Creatinine Clearance: 34.4 mL/min (by C-G formula based on SCr of 1 mg/dL). No results for input(s): AST, ALT, ALKPHOS, BILITOT, PROT, ALBUMIN in the last 168 hours. No results for input(s): LIPASE, AMYLASE in the last 168 hours. No results for input(s): AMMONIA in the last 168 hours. Coagulation Profile: No results for input(s): INR, PROTIME in the last 168 hours. Unresulted Labs (From admission, onward)     Start     Ordered   03/25/24 0500  Basic metabolic panel with GFR  Daily,   R      03/24/24 0753   03/25/24 0500  CBC  Daily,   R      03/24/24 0753            Antimicrobials/Microbiology: Anti-infectives (From admission, onward)    Start     Dose/Rate Route Frequency Ordered Stop   03/23/24 1845  azithromycin  (ZITHROMAX ) 500 mg in sodium chloride  0.9 % 250 mL IVPB  Status:  Discontinued        500 mg 250 mL/hr over 60 Minutes Intravenous Every 24 hours 03/22/24 2109 03/23/24 1258   03/23/24 1830  cefTRIAXone  (ROCEPHIN ) 2 g in sodium chloride  0.9 % 100 mL IVPB        2 g 200 mL/hr over 30 Minutes Intravenous Every 24 hours 03/22/24 2109 03/27/24 1829   03/23/24 1800  azithromycin  (ZITHROMAX ) tablet 500 mg        500 mg Oral Daily-1800 03/23/24 1258 03/27/24 1759   03/22/24 1800  cefTRIAXone  (ROCEPHIN ) 2 g in sodium chloride  0.9 % 100 mL IVPB        2 g 200 mL/hr over 30 Minutes Intravenous  Once 03/22/24 1752 03/22/24 1857   03/22/24 1800  azithromycin  (ZITHROMAX ) 500 mg in sodium chloride  0.9 % 250 mL IVPB        500 mg 250 mL/hr over 60 Minutes Intravenous  Once 03/22/24 1752 03/22/24 1946         Component Value Date/Time   SDES  02/25/2024 1111    URINE, CLEAN CATCH Performed at [facility], [address], [place], [place] [address]    [facility]  02/25/2024 1111    NONE Performed at [facility], [address], [place], [place] [address]    CULT >=100,000 COLONIES/mL ENTEROBACTER CLOACAE (A) 02/25/2024 1111   REPTSTATUS 02/28/2024 FINAL 02/25/2024 1111    Procedures:    Mennie LAMY, MD [facility] 03/25/2024, 9:58 AM   "

## 2024-03-25 NOTE — TOC Progression Note (Addendum)
 Transition of Care [facility]) - Progression Note    Patient Details  Name: [name] MRN: [id number] Date of Birth: 1930-03-16  [facility] CM/SW Contact  Sonda Manuella Quill, RN Phone Number: 03/25/2024, 5:48 PM  Clinical Narrative:    Spoke w/pt in room; she is unsure if she has full travel tank; she requested her dtr be contacted; LVM for dtr [name] [phone]); awaiting return call.  -[phone]- return call from pt's dtr Donzell; she said she will bring full travel tank to hospital at d/c; IP CM is following.  Expected Discharge Plan: Home w Home Health Services Barriers to Discharge: Continued Medical Work up               Expected Discharge Plan and Services In-house Referral: Clinical Social Work     Living arrangements for the past 2 months: Single Family Home                 DME Arranged: N/A DME Agency: NA       HH Arranged: PT, OT, RN HH Agency: [facility] Date HH Agency Contacted: 03/24/24 Time HH Agency Contacted: 1132 Representative spoke with at [facility] Agency: [place]    Social Drivers of Health (SDOH) Interventions SDOH Screenings   Food Insecurity: No Food Insecurity (03/23/2024)  Housing: Low Risk (03/23/2024)  Transportation Needs: No Transportation Needs (03/23/2024)  Utilities: Not At Risk (03/23/2024)  Alcohol  Screen: Low Risk (08/26/2023)  Depression (PHQ2-9): Medium Risk (09/30/2023)  Financial Resource Strain: Low Risk (08/26/2023)  Physical Activity: Insufficiently Active (08/26/2023)  Social Connections: Socially Isolated (03/23/2024)  Stress: No Stress Concern Present (08/26/2023)  Tobacco Use: Low Risk (03/23/2024)  Health Literacy: Adequate Health Literacy (08/26/2023)    Readmission Risk Interventions    03/24/2024   11:14 AM 02/28/2024   11:11 AM  Readmission Risk Prevention Plan  Transportation Screening Complete Complete  HRI or Home Care Consult  Complete  Social Work Consult for Recovery Care  Planning/Counseling  Complete  Palliative Care Screening  Not Applicable  Medication Review Oceanographer) Complete Complete  HRI or Home Care Consult Complete   SW Recovery Care/Counseling Consult Complete   Palliative Care Screening Not Applicable   Skilled Nursing Facility Not Applicable

## 2024-03-25 NOTE — Plan of Care (Incomplete)

## 2024-03-26 ENCOUNTER — Inpatient Hospital Stay (HOSPITAL_COMMUNITY)

## 2024-03-26 DIAGNOSIS — I5021 Acute systolic (congestive) heart failure: Secondary | ICD-10-CM

## 2024-03-26 DIAGNOSIS — J9621 Acute and chronic respiratory failure with hypoxia: Secondary | ICD-10-CM | POA: Diagnosis not present

## 2024-03-26 LAB — CBC
HCT: 29.7 % — ABNORMAL LOW (ref 36.0–46.0)
Hemoglobin: 9.7 g/dL — ABNORMAL LOW (ref 12.0–15.0)
MCH: 30.1 pg (ref 26.0–34.0)
MCHC: 32.7 g/dL (ref 30.0–36.0)
MCV: 92.2 fL (ref 80.0–100.0)
Platelets: 340 K/uL (ref 150–400)
RBC: 3.22 MIL/uL — ABNORMAL LOW (ref 3.87–5.11)
RDW: 14.2 % (ref 11.5–15.5)
WBC: 12.3 K/uL — ABNORMAL HIGH (ref 4.0–10.5)
nRBC: 0 % (ref 0.0–0.2)

## 2024-03-26 LAB — BASIC METABOLIC PANEL WITH GFR
Anion gap: 8 (ref 5–15)
BUN: 30 mg/dL — ABNORMAL HIGH (ref 8–23)
CO2: 35 mmol/L — ABNORMAL HIGH (ref 22–32)
Calcium: 8.8 mg/dL — ABNORMAL LOW (ref 8.9–10.3)
Chloride: 97 mmol/L — ABNORMAL LOW (ref 98–111)
Creatinine, Ser: 1.05 mg/dL — ABNORMAL HIGH (ref 0.44–1.00)
GFR, Estimated: 50 mL/min — ABNORMAL LOW
Glucose, Bld: 96 mg/dL (ref 70–99)
Potassium: 4.3 mmol/L (ref 3.5–5.1)
Sodium: 139 mmol/L (ref 135–145)

## 2024-03-26 MED ORDER — CARVEDILOL 6.25 MG PO TABS
6.2500 mg | ORAL_TABLET | Freq: Two times a day (BID) | ORAL | Status: DC
  Administered 2024-03-26 – 2024-03-29 (×6): 6.25 mg via ORAL
  Filled 2024-03-26 (×6): qty 1

## 2024-03-26 MED ORDER — FUROSEMIDE 40 MG PO TABS
40.0000 mg | ORAL_TABLET | Freq: Every day | ORAL | Status: DC
  Administered 2024-03-27: 40 mg via ORAL
  Filled 2024-03-26: qty 1

## 2024-03-26 NOTE — Plan of Care (Incomplete)
" °  Problem: Education: Goal: Knowledge of General Education information will improve Description: Including pain rating scale, medication(s)/side effects and non-pharmacologic comfort measures Outcome: Progressing   Problem: Health Behavior/Discharge Planning: Goal: Ability to manage health-related needs will improve Outcome: Progressing   Problem: Clinical Measurements: Goal: Ability to maintain clinical measurements within normal limits will improve Outcome: Progressing Goal: Will remain free from infection Outcome: Progressing Goal: Diagnostic test results will improve Outcome: Progressing Goal: Respiratory complications will improve Outcome: Progressing   Problem: Activity: Goal: Risk for activity intolerance will decrease Outcome: Progressing   Problem: Coping: Goal: Level of anxiety will decrease Outcome: Progressing   Problem: Clinical Measurements: Goal: Cardiovascular complication will be avoided Outcome: Adequate for Discharge   Problem: Nutrition: Goal: Adequate nutrition will be maintained Outcome: Adequate for Discharge   Problem: Elimination: Goal: Will not experience complications related to bowel motility Outcome: Adequate for Discharge Goal: Will not experience complications related to urinary retention Outcome: Adequate for Discharge   "

## 2024-03-26 NOTE — Evaluation (Signed)
 Physical Therapy Evaluation Patient Details Name: [name] MRN: [id number] DOB: 1930-02-24 Today's Date: 03/26/2024  History of Present Illness  Pt is a 89 y.o. female admittd on 03/22/24 with acute on chronic respiratory failure with hypoxia secondary to new dx of CHF. Pt with recent hospitalization s/p fall and L pubic bone fx + rib fx 12/10-12/13. Pt PMH includes but is not limited to: dementia, asthma, chronic hypoxemic respiratory failure with pt on 2 L/min supplemental O2 at baseline, frequent falls, HTN, HLD, CKD III, anxiety, depression, GCA, polymyalgia rheumatic on actemra  infusions, RLS, GERD, vit B 12 deficiency, back surgery, R DRF 12/2023, and R ankle fx/ORIF.  Clinical Impression  Pt admitted with above diagnosis. At baseline pt is ambulatory with rollator, she had a recent pelvic fx and has mainly been transferring to w/c with support.  Pt is now living with daughter and daughter and daughter in law providing 24 hr care.  She has all necessary DME.  Today, pt lethargic but arousable and agreeable to PT/OT.  She transferred and took side steps with RW and light min A to steady.  Pt expected to progress well and be able to return home with family.  Daughter declines needs for HHPT - reports I'll get her up and moving, no need for [facility]. Educated that if changes mind can always request HHPT prior to d/c or from PCP.   Pt currently with functional limitations due to the deficits listed below (see PT Problem List). Pt will benefit from acute skilled PT to increase their independence and safety with mobility to allow discharge.           If plan is discharge home, recommend the following: A little help with walking and/or transfers;A little help with bathing/dressing/bathroom;Assistance with cooking/housework;Help with stairs or ramp for entrance   Can travel by private vehicle        Equipment Recommendations None recommended by PT  Recommendations for Other Services        Functional Status Assessment Patient has had a recent decline in their functional status and demonstrates the ability to make significant improvements in function in a reasonable and predictable amount of time.     Precautions / Restrictions Precautions Precautions: Fall Required Braces or Orthoses: Other Brace Other Brace: Pt wearing wrist brace for comfort - cleared from ortho for no brace and wbat Restrictions Weight Bearing Restrictions Per Provider Order: No Other Position/Activity Restrictions: WBAT      Mobility  Bed Mobility Overal bed mobility: Needs Assistance Bed Mobility: Supine to Sit, Sit to Supine     Supine to sit: Min assist, Used rails Sit to supine: Min assist, Used rails   General bed mobility comments: light min A to steady    Transfers Overall transfer level: Needs assistance Equipment used: Rolling walker (2 wheels) Transfers: Sit to/from Stand Sit to Stand: Min assist           General transfer comment: increased time and light min A to steady    Ambulation/Gait Ambulation/Gait assistance: Min Chemical Engineer (Feet): 3 Feet Assistive device: Rolling walker (2 wheels) Gait Pattern/deviations: Step-to pattern, Decreased stride length, Shuffle Gait velocity: decreased     General Gait Details: Pt reports very tired; light min A to steady with side steps  Stairs            Wheelchair Mobility     Tilt Bed    Modified Rankin (Stroke Patients Only)       Balance Overall balance  assessment: Needs assistance Sitting-balance support: No upper extremity supported Sitting balance-Leahy Scale: Good     Standing balance support: Bilateral upper extremity supported, Reliant on assistive device for balance Standing balance-Leahy Scale: Poor Standing balance comment: Using RW and light min A                             Pertinent Vitals/Pain Pain Assessment Pain Assessment: No/denies pain    Home Living  Family/patient expects to be discharged to:: Private residence Living Arrangements: Children (daughter) Available Help at Discharge: Family;Available 24 hours/day (daughter and sister in law to assist) Type of Home: House Home Access: Ramped entrance       Home Layout: One level Home Equipment: Rollator (4 wheels);BSC/3in1;Shower seat;Wheelchair - manual;Grab bars - tub/shower;Hand held shower head;Adaptive equipment;Hospital bed Additional Comments: Daughter provided information; 2 L O2 at home    Prior Function Prior Level of Function : Needs assist             Mobility Comments: household ambulation using Rollator, uses wheelchair for longer distances ; since pelvic fx pt has only been able to stand pivot to w/c with help ADLs Comments: Pt needs assist for all ADLs (even prior to pelvic fx)     Extremity/Trunk Assessment   Upper Extremity Assessment Upper Extremity Assessment: Defer to OT evaluation    Lower Extremity Assessment Lower Extremity Assessment: LLE deficits/detail;RLE deficits/detail RLE Deficits / Details: ROM WFL; MMT grossly 4/5 throughout LLE Deficits / Details: ROM WFL; MMT grossly 4/5 throughout    Cervical / Trunk Assessment Cervical / Trunk Assessment: Kyphotic  Communication   Communication Factors Affecting Communication: Hearing impaired    Cognition Arousal: Alert Behavior During Therapy: WFL for tasks assessed/performed   PT - Cognitive impairments: No apparent impairments                       PT - Cognition Comments: Pt lethargic but arousable Following commands: Intact       Cueing       General Comments General comments (skin integrity, edema, etc.): Pt on 2 L O2 with O2 sats >95%.  HR 57 bpm rest and 80 bpm activity    Exercises     Assessment/Plan    PT Assessment Patient needs continued PT services  PT Problem List Decreased strength;Decreased range of motion;Decreased activity tolerance;Decreased  balance;Decreased mobility;Decreased knowledge of use of DME       PT Treatment Interventions DME instruction;Therapeutic exercise;Gait training;Functional mobility training;Therapeutic activities;Patient/family education;Balance training    PT Goals (Current goals can be found in the Care Plan section)  Acute Rehab PT Goals Patient Stated Goal: return home w daughter PT Goal Formulation: With patient/family Time For Goal Achievement: 04/09/24 Potential to Achieve Goals: Good    Frequency Min 2X/week     Co-evaluation               AM-PAC PT 6 Clicks Mobility  Outcome Measure Help needed turning from your back to your side while in a flat bed without using bedrails?: A Little Help needed moving from lying on your back to sitting on the side of a flat bed without using bedrails?: A Little Help needed moving to and from a bed to a chair (including a wheelchair)?: A Little Help needed standing up from a chair using your arms (e.g., wheelchair or bedside chair)?: A Little Help needed to walk in hospital room?: Total Help needed  climbing 3-5 steps with a railing? : Total 6 Click Score: 14    End of Session Equipment Utilized During Treatment: Gait belt Activity Tolerance: Patient limited by lethargy Patient left: in bed;with call bell/phone within reach;with family/visitor present;with bed alarm set Nurse Communication: Mobility status PT Visit Diagnosis: Other abnormalities of gait and mobility (R26.89);Muscle weakness (generalized) (M62.81)    Time: 8583-8564 PT Time Calculation (min) (ACUTE ONLY): 19 min   Charges:   PT Evaluation $PT Eval Low Complexity: 1 Low   PT General Charges $$ ACUTE PT VISIT: 1 Visit         Benjiman, PT Acute Rehab [facility] [phone]   Benjiman VEAR Mulberry 03/26/2024, 2:47 PM

## 2024-03-26 NOTE — Progress Notes (Signed)
 "  Rounding Note   Patient Name: [name] Date of Encounter: 03/26/2024  [facility] Cardiologist: New  Subjective No CP or dyspnea  Scheduled Meds:  arformoterol   15 mcg Nebulization BID   ARIPiprazole   2 mg Oral Daily   aspirin  EC  81 mg Oral Daily   azithromycin   500 mg Oral q1800   budesonide  (PULMICORT ) nebulizer solution  0.5 mg Nebulization BID   buPROPion   150 mg Oral q AM   calcium -vitamin D   1 tablet Oral Q breakfast   carvedilol   3.125 mg Oral BID WC   cholecalciferol   2,000 Units Oral Daily   enoxaparin  (LOVENOX ) injection  40 mg Subcutaneous Q24H   ferrous sulfate   325 mg Oral Q breakfast   folic acid   1 mg Oral Daily   furosemide   40 mg Intravenous Daily   guaiFENesin   600 mg Oral BID   loratadine   10 mg Oral Daily   memantine   5 mg Oral BID   omega-3 acid ethyl esters  2 capsule Oral BID   pantoprazole   40 mg Oral Daily   predniSONE   40 mg Oral QAC breakfast   rOPINIRole   1 mg Oral Daily   rOPINIRole   2 mg Oral QHS   sacubitril -valsartan   1 tablet Oral BID   sertraline   50 mg Oral q AM   sodium chloride  flush  3 mL Intravenous Q12H   spironolactone   12.5 mg Oral Daily   Continuous Infusions:  cefTRIAXone  (ROCEPHIN )  IV 2 g (03/25/24 1732)   PRN Meds: acetaminophen  **OR** acetaminophen , artificial tears, bisacodyl , diazepam , diazepam , HYDROcodone  bit-homatropine, HYDROcodone -acetaminophen , ipratropium-albuterol , ondansetron  **OR** ondansetron  (ZOFRAN ) IV, senna-docusate   Vital Signs  Vitals:   03/26/24 0346 03/26/24 0828 03/26/24 0830 03/26/24 0831  BP: (!) 141/95     Pulse: 73     Resp: 20     Temp: 97.8 F (36.6 C)     TempSrc: Oral     SpO2: 98% 94% 94% 94%  Weight: 73.6 kg     Height:        Intake/Output Summary (Last 24 hours) at 03/26/2024 1016 Last data filed at 03/26/2024 0800 Gross per 24 hour  Intake 840 ml  Output 1400 ml  Net -560 ml      03/26/2024    3:46 AM 03/25/2024    5:48 AM 03/23/2024    4:54 AM  Last 3  Weights  Weight (lbs) 162 lb 4.1 oz 161 lb 2.5 oz 170 lb 10.2 oz  Weight (kg) 73.6 kg 73.1 kg 77.4 kg      Telemetry Sinus with PACs - Personally Reviewed   Physical Exam  GEN: No acute distress.   Neck: No JVD Cardiac: RRR, no murmurs, rubs, or gallops.  Respiratory: Clear to auscultation bilaterally. GI: Soft, nontender, non-distended  MS: No edema Neuro:  Nonfocal  Psych: Normal affect   Labs  Recent Labs  Lab 03/22/24 1609 03/22/24 1842  TRNPT 96* 85*       Chemistry Recent Labs  Lab 03/23/24 0401 03/24/24 0354 03/25/24 0414 03/26/24 0401  NA 144 138 140 139  K 5.0 4.9 4.3 4.3  CL 103 97* 95* 97*  CO2 32 31 39* 35*  GLUCOSE 127* 158* 123* 96  BUN 24* 30* 29* 30*  CREATININE 0.92 1.41* 1.00 1.05*  CALCIUM  9.4 9.3 9.4 8.8*  MG 2.2 2.4  --   --   GFRNONAA 58* 35* 53* 50*  ANIONGAP 8 9 6  8  Hematology Recent Labs  Lab 03/24/24 0354 03/25/24 0414 03/26/24 0401  WBC 9.2 9.9 12.3*  RBC 3.17* 3.31* 3.22*  HGB 9.4* 9.8* 9.7*  HCT 30.5* 31.1* 29.7*  MCV 96.2 94.0 92.2  MCH 29.7 29.6 30.1  MCHC 30.8 31.5 32.7  RDW 14.5 14.3 14.2  PLT 311 327 340    BNP Recent Labs  Lab 03/22/24 1609  PROBNP 23,103.0*      Radiology  DG Chest Port 1 View Result Date: 03/26/2024 CLINICAL DATA:  Pleural effusion. EXAM: PORTABLE CHEST 1 VIEW COMPARISON:  03/24/2024 FINDINGS: The cardio pericardial silhouette is enlarged. Similar retrocardiac left base collapse/consolidation with small effusion. Interval increase in atelectasis or infiltrate at the right base. No acute bony abnormality. IMPRESSION: 1. Interval increase in atelectasis or infiltrate at the right base. 2. Similar retrocardiac left base collapse/consolidation with small effusion. Electronically Signed   By: Camellia Candle M.D.   On: 03/26/2024 07:15     Patient Profile   89 year old female with past medical history of orthostatic hypotension, polymyalgia rheumatica, giant cell arteritis, chronic stage  IIIa kidney disease, frequent falls, chronic hypoxic respiratory failure on 2 L of home oxygen , mild dementia for evaluation of congestive heart failure. Echocardiogram shows ejection fraction 35 to 40%, moderate left atrial enlargement, moderate mitral regurgitation, mild to moderate aortic insufficiency.   Assessment & Plan  1 acute combined systolic/diastolic congestive heart failure-volume status has improved.  Will change Lasix  to 40 mg by mouth daily.  Continue spironolactone .  No SGLT2 inhibitor as she has limited mobility and difficulty controlling her urine.  Continue Entresto .  Increase carvedilol  to 6.25 mg twice daily.   2 acute on chronic stage IIIa kidney disease-will need close follow-up of renal function after discharge.   3 hypertension-continue present medications and adjust as an outpatient.   4 liver mass-possible cancer.  Per daughter given patient's overall medical condition and age patient has elected not to have biopsy.  She has a no CODE BLUE.  Patient can be discharged from a cardiac standpoint when okay with primary service.  Will check potassium and renal function in 1 week.  Will schedule follow-up with APP in 2 to 4 weeks.  She will follow-up with me in 3 to 6 months.  Please call with questions.  For questions or updates, please contact [facility] Please consult [email or web address] for contact info under   Signed, Redell Shallow, MD  03/26/2024, 10:16 AM     "

## 2024-03-26 NOTE — Progress Notes (Signed)
 " PROGRESS NOTE [name]  FMW:[id number] DOB: 09/02/1929 DOA: 03/22/2024 PCP: Wendolyn Jenkins Jansky, MD  Brief Narrative/Hospital Course: [name] is a 89 y.o. female with PMH of for asthma, chronic respiratory failure with hypoxia in [address] at baseline, GCA/PMR on Actemra  and chronic prednisone  5 mg daily, CKD stage IIIa, HTN, RLS, B12 deficiency, depression/anxiety, dementia, liver mass/transverse colon thickening (presumed neoplasm/metastatic disease -biopsy not pursued) who is admitted with acute on chronic hypoxic respiratory failure likely secondary to new diagnosis of congestive heart failure after presenting on 03/22/2024 with chronic cough shortness of breath, productive cough-d proBNP 23,103, troponin T 96 >85, WBC 8.9, hemoglobin 9.4, platelets 278, sodium 142, potassium 4.5, bicarb 28, BUN 22, creatinine 0.95, serum glucose 105.  X-ray showed pulmonary edema moderate left pleural effusion possible LLL consolidation.\ 1/6-TTE: EF 35-40%, GHK.  And cardiology was consulted  Subjective: Seen and examined Overall feeling better, but still wheezing and Overnight no fever vital stable, [place] down to 1l  on 2l  home setting labs stable electrolytes, mild leukocytosis Weight at 162 pound on admission 170  Assessment and plan:  Acute systolic CHF-new diagnosis LLL consilidation/acute pulmonary edema/left pleural effusion: Echo-low EF 35-40%, cardiology has been consulted. Son passed away last 04/12/2024- ? If stress induced  Cont  iv Lasix  40 IV and GDMT-continue Arbs, added Coreg , Entresto , Aldactone  1/8>> as per cardiology. repeat chest x-ray 1/9 small effusion so will hold off on thoracentesis, has Interval increase in the atelectasis or infiltrate in the right base Cont to monitor daily I/O,weight, electrolytes and net balance.Keep on  salt/fluid restricted diet and monitor in tele. Net IO Since Admission: -2,444 mL [03/26/24 1049] Improving as below Filed Weights   03/23/24 0454 03/25/24 0548  03/26/24 0346  Weight: 77.4 kg 73.1 kg 73.6 kg    Recent Labs  Lab 03/22/24 1609 03/23/24 0401 03/24/24 0354 03/25/24 0414 03/26/24 0401  PROBNP 23,103.0*  --   --   --   --   BUN 22 24* 30* 29* 30*  CREATININE 0.95 0.92 1.41* 1.00 1.05*  K 4.5 5.0 4.9 4.3 4.3  MG  --  2.2 2.4  --   --     Acute on chronic respiratory failure with hypoxia on 2l [place] at baseline: Community-acquired pneumonia Immunocompromise status: Continue empiric antibiotics-ceftriaxone /azithromycin .Has mild leukocytosis but no fever, strep pneumoniae/Legionella negative.on home oxygen  setting.    Hypertension: BP while controlled. Holding home Norvasc .  Continue GDMT as above.  Continue aspirin    Asthma/?? Acute asthma exacerbation: Continue Brovana  Pulmicort , bronchodilators and steroid taper along with antitussives  Giant cell arteritis/polymyalgia rheumatica: Chronically on Prednisone  5 mg,on Actemra  iv q28d- last one in Nov- was held due to being on antibiotics-next appointment in February. Continue steroid as above.   AKI on CKD stage IIIa Creatinine improved to baseline. Cont to monitor while on diuretics  Recent Labs    01/01/24 0517 01/02/24 0835 01/03/24 0907 02/25/24 1142 02/26/24 0447 03/22/24 1609 03/23/24 0401 03/24/24 0354 03/25/24 0414 03/26/24 0401  BUN 23 28* 29* 15 18 22  24* 30* 29* 30*  CREATININE 1.27* 1.43* 1.03* 0.82 0.90 0.95 0.92 1.41* 1.00 1.05*  CO2 25 28 27 29 28 28  32 31 39* 35*  K 4.9 5.0 4.9 4.4 4.6 4.5 5.0 4.9 4.3 4.3    Normocytic anemia Iron deficiency anemia:  no acute blood loss noticed hemoglobin stable, added oral iron supplement. Anemia panel done.  Dementia Anxiety/Depression RLS: Continue home regimen Abilify , bupropion , sertraline , Requip , PRN Valium  along with  her Namenda , keep in delirium precaution fall precaution   Liver mass/circumferential wall thickening of the transverse colon: Noted on prior CT imaging of 12/30/2023.Findings on CT  concerning for primary neoplasm or metastatic disease. OF NOTE patient and family noted to have deferred workup as she would not want to pursue any oncological treatment.  Class I Obesity w/ Body mass index is 28.74 kg/m.: Will benefit with PCP follow-up, weight loss  Mobility: PT Orders: Active PT Follow up Rec:     DVT prophylaxis: enoxaparin  (LOVENOX ) injection 40 mg Start: 03/25/24 2000 Code Status:   Code Status: Limited: Do not attempt resuscitation (DNR) -DNR-LIMITED -Do Not Intubate/DNI  Family Communication: plan of care discussed with patient/ daughterat bedside Patient status is: Remains hospitalized because of severity of illness Level of care: Telemetry   Dispo: The patient is from: HOME w/ son who passes last saturday            Anticipated disposition: HOME W/ HH 1-2 DAYS. Objective: Vitals last 24 hrs: Vitals:   03/26/24 0346 03/26/24 0828 03/26/24 0830 03/26/24 0831  BP: (!) 141/95     Pulse: 73     Resp: 20     Temp: 97.8 F (36.6 C)     TempSrc: Oral     SpO2: 98% 94% 94% 94%  Weight: 73.6 kg     Height:        Physical Examination: General exam: AAO , HOIH coughing HEENT:Oral mucosa moist, Ear/Nose WNL grossly Respiratory system: Bilaterally wheezing on expiration, no crackles ,no use of accessory muscle Cardiovascular system: S1 & S2 +, No JVD. Gastrointestinal system: Abdomen soft,NT,ND, BS+ Nervous System: Alert, awake, moving all extremities,and following commands. Extremities: extremities warm, leg edema mild improving with skin wrinkling Skin: Warm, no rashes MSK: Normal muscle bulk,tone, power   Medications reviewed:  Scheduled Meds:  arformoterol   15 mcg Nebulization BID   ARIPiprazole   2 mg Oral Daily   aspirin  EC  81 mg Oral Daily   azithromycin   500 mg Oral q1800   budesonide  (PULMICORT ) nebulizer solution  0.5 mg Nebulization BID   buPROPion   150 mg Oral q AM   calcium -vitamin D   1 tablet Oral Q breakfast   carvedilol   6.25 mg Oral  BID WC   cholecalciferol   2,000 Units Oral Daily   enoxaparin  (LOVENOX ) injection  40 mg Subcutaneous Q24H   ferrous sulfate   325 mg Oral Q breakfast   folic acid   1 mg Oral Daily   [START ON 03/27/2024] furosemide   40 mg Oral Daily   guaiFENesin   600 mg Oral BID   loratadine   10 mg Oral Daily   memantine   5 mg Oral BID   omega-3 acid ethyl esters  2 capsule Oral BID   pantoprazole   40 mg Oral Daily   predniSONE   40 mg Oral QAC breakfast   rOPINIRole   1 mg Oral Daily   rOPINIRole   2 mg Oral QHS   sacubitril -valsartan   1 tablet Oral BID   sertraline   50 mg Oral q AM   sodium chloride  flush  3 mL Intravenous Q12H   spironolactone   12.5 mg Oral Daily   Continuous Infusions:  cefTRIAXone  (ROCEPHIN )  IV 2 g (03/25/24 1732)   Diet: Diet Order             Diet Heart Fluid consistency: Thin  Diet effective now                   Data Reviewed: I have  personally reviewed following labs and imaging studies ( see epic result tab) CBC: Recent Labs  Lab 03/22/24 1609 03/23/24 0401 03/24/24 0354 03/25/24 0414 03/26/24 0401  WBC 8.9 6.8 9.2 9.9 12.3*  NEUTROABS  --   --  8.0*  --   --   HGB 9.4* 9.7* 9.4* 9.8* 9.7*  HCT 31.1* 31.8* 30.5* 31.1* 29.7*  MCV 98.1 97.2 96.2 94.0 92.2  PLT 278 299 311 327 340   CMP: Recent Labs  Lab 03/22/24 1609 03/23/24 0401 03/24/24 0354 03/25/24 0414 03/26/24 0401  NA 142 144 138 140 139  K 4.5 5.0 4.9 4.3 4.3  CL 105 103 97* 95* 97*  CO2 28 32 31 39* 35*  GLUCOSE 105* 127* 158* 123* 96  BUN 22 24* 30* 29* 30*  CREATININE 0.95 0.92 1.41* 1.00 1.05*  CALCIUM  9.4 9.4 9.3 9.4 8.8*  MG  --  2.2 2.4  --   --    GFR: Estimated Creatinine Clearance: 32.9 mL/min (A) (by C-G formula based on SCr of 1.05 mg/dL (H)). No results for input(s): AST, ALT, ALKPHOS, BILITOT, PROT, ALBUMIN in the last 168 hours. No results for input(s): LIPASE, AMYLASE in the last 168 hours. No results for input(s): AMMONIA in the last 168  hours. Coagulation Profile: No results for input(s): INR, PROTIME in the last 168 hours. Unresulted Labs (From admission, onward)     Start     Ordered   03/27/24 0500  CBC  Tomorrow morning,   R       Question:  Specimen collection method  Answer:  Lab=Lab collect   03/26/24 0849   03/27/24 0500  Basic metabolic panel with GFR  Daily,   R     Question:  Specimen collection method  Answer:  Lab=Lab collect   03/26/24 0849           Antimicrobials/Microbiology: Anti-infectives (From admission, onward)    Start     Dose/Rate Route Frequency Ordered Stop   03/23/24 1845  azithromycin  (ZITHROMAX ) 500 mg in sodium chloride  0.9 % 250 mL IVPB  Status:  Discontinued        500 mg 250 mL/hr over 60 Minutes Intravenous Every 24 hours 03/22/24 2109 03/23/24 1258   03/23/24 1830  cefTRIAXone  (ROCEPHIN ) 2 g in sodium chloride  0.9 % 100 mL IVPB        2 g 200 mL/hr over 30 Minutes Intravenous Every 24 hours 03/22/24 2109 03/27/24 1829   03/23/24 1800  azithromycin  (ZITHROMAX ) tablet 500 mg        500 mg Oral Daily-1800 03/23/24 1258 03/27/24 1759   03/22/24 1800  cefTRIAXone  (ROCEPHIN ) 2 g in sodium chloride  0.9 % 100 mL IVPB        2 g 200 mL/hr over 30 Minutes Intravenous  Once 03/22/24 1752 03/22/24 1857   03/22/24 1800  azithromycin  (ZITHROMAX ) 500 mg in sodium chloride  0.9 % 250 mL IVPB        500 mg 250 mL/hr over 60 Minutes Intravenous  Once 03/22/24 1752 03/22/24 1946         Component Value Date/Time   SDES  02/25/2024 1111    URINE, CLEAN CATCH Performed at [facility], [address] [place], [place] [address]    [facility]  02/25/2024 1111    NONE Performed at [facility], [address] [place], [place] [address]    CULT >=100,000 COLONIES/mL ENTEROBACTER CLOACAE (A) 02/25/2024 1111   REPTSTATUS 02/28/2024 FINAL 02/25/2024 1111  Procedures:    Mennie LAMY, MD [facility] 03/26/2024, 10:49 AM   "

## 2024-03-26 NOTE — Evaluation (Signed)
 Occupational Therapy Evaluation Patient Details Name: [name] MRN: [id number] DOB: June 05, 1929 Today's Date: 03/26/2024   History of Present Illness   Pt is a 89 y.o. female admittd on 03/22/24 with acute on chronic respiratory failure with hypoxia secondary to new dx of CHF. Pt with recent hospitalization s/p fall and L pubic bone fx + rib fx 12/10-12/13. Pt PMH includes but is not limited to: dementia, asthma, chronic hypoxemic respiratory failure with pt on 2 L/min supplemental O2 at baseline, frequent falls, HTN, HLD, CKD III, anxiety, depression, GCA, polymyalgia rheumatic on actemra  infusions, RLS, GERD, vit B 12 deficiency, back surgery, R DRF 12/2023, and R ankle fx/ORIF.     Clinical Impressions PTA, patient lives at home with daughter and was on baseline 2 ltrs O2 and was using w/c and RW for SPT's and family assists with ADL's. Daughter bedside for evaluation. Currently, patient presents with deficits outlined below (see OT Problem List for details) most significantly decreased activity tolerance, balance, generalized strength limiting BADL's and functional mobility performance. Patient had been discharged from [facility] recently and daughter feels with her and family assist patient will not require [facility] OT services this time upon acute hospital discharge and that all DME and AE is in place.  Patient requires continued Acute care hospital level OT services to progress safety and functional performance and allow for discharge.       If plan is discharge home, recommend the following:   A lot of help with walking and/or transfers;A lot of help with bathing/dressing/bathroom;Assistance with cooking/housework;Assist for transportation;Help with stairs or ramp for entrance     Functional Status Assessment   Patient has had a recent decline in their functional status and demonstrates the ability to make significant improvements in function in a reasonable and predictable amount of time.      Equipment Recommendations   None recommended by OT      Precautions/Restrictions   Precautions Precautions: Fall Required Braces or Orthoses: Other Brace Other Brace: Pt wearing wrist brace for comfort - cleared from ortho for no brace and wbat Restrictions Weight Bearing Restrictions Per Provider Order: No Other Position/Activity Restrictions: WBAT     Mobility Bed Mobility Overal bed mobility: Needs Assistance Bed Mobility: Supine to Sit, Sit to Supine     Supine to sit: Min assist, Used rails Sit to supine: Min assist, Used rails   General bed mobility comments: light min A to steady    Transfers Overall transfer level: Needs assistance Equipment used: Rolling walker (2 wheels) Transfers: Sit to/from Stand Sit to Stand: Min assist           General transfer comment: increased time and light min A to steady      Balance Overall balance assessment: Needs assistance Sitting-balance support: No upper extremity supported Sitting balance-Leahy Scale: Good     Standing balance support: Bilateral upper extremity supported, Reliant on assistive device for balance Standing balance-Leahy Scale: Poor Standing balance comment: Using RW and light min A                           ADL either performed or assessed with clinical judgement   ADL Overall ADL's : Needs assistance/impaired Eating/Feeding: Set up;Sitting   Grooming: Wash/dry hands;Wash/dry face;Sitting;Set up   Upper Body Bathing: Minimal assistance;Sitting   Lower Body Bathing: Moderate assistance;Sit to/from stand   Upper Body Dressing : Minimal assistance;Sitting   Lower Body Dressing: Moderate assistance;Sitting/lateral leans  Toilet Transfer:  (deferred due to fatigue) Toilet Transfer Details (indicate cue type and reason): side stepped laterally with CGA/min A Toileting- Clothing Manipulation and Hygiene: Minimal assistance;Sitting/lateral lean       Functional mobility  during ADLs: Minimal assistance;Rolling walker (2 wheels) General ADL Comments: decreased reach to LE's     Vision Baseline Vision/History: 0 No visual deficits Patient Visual Report: No change from baseline              Pertinent Vitals/Pain Pain Assessment Pain Assessment: No/denies pain     Extremity/Trunk Assessment Upper Extremity Assessment Upper Extremity Assessment: Generalized weakness;Right hand dominant;RUE deficits/detail RUE Deficits / Details: healed R wrist fx now cleared for WB and all use, ROM and has wrist support patient prefers to wear, sees ortho in the next 3 weeks for complete clearance, no pain nor limitations aside from generalized weakness overall 4-/5 RUE Sensation: WNL RUE Coordination: WNL   Lower Extremity Assessment Lower Extremity Assessment: Defer to PT evaluation RLE Deficits / Details: ROM WFL; MMT grossly 4/5 throughout LLE Deficits / Details: ROM WFL; MMT grossly 4/5 throughout   Cervical / Trunk Assessment Cervical / Trunk Assessment: Kyphotic   Communication Communication Communication: Impaired Factors Affecting Communication: Hearing impaired   Cognition Arousal: Alert Behavior During Therapy: WFL for tasks assessed/performed Cognition: No apparent impairments                               Following commands: Intact       Cueing  General Comments   Cueing Techniques: Verbal cues  Pt on 2 L O2 with O2 sats >95%. HR 57 bpm rest and 80 bpm activity           Home Living Family/patient expects to be discharged to:: Private residence Living Arrangements: Children (daughter) Available Help at Discharge: Family;Available 24 hours/day (daughter and sister in law to assist) Type of Home: House Home Access: Ramped entrance     Home Layout: One level     Bathroom Shower/Tub: Producer, Television/film/video: Handicapped height Bathroom Accessibility: Yes   Home Equipment: Rollator (4  wheels);BSC/3in1;Shower seat;Wheelchair - manual;Grab bars - tub/shower;Hand held shower head;Adaptive equipment;Hospital bed Adaptive Equipment: Reacher;Long-handled sponge Additional Comments: Daughter provided information; 2 L O2 at home      Prior Functioning/Environment Prior Level of Function : Needs assist             Mobility Comments: household ambulation using Rollator, uses wheelchair for longer distances ; since pelvic fx pt has only been able to stand pivot to w/c with help ADLs Comments: Pt needs assist for all ADLs (even prior to pelvic fx)    OT Problem List: Decreased strength;Decreased activity tolerance;Impaired balance (sitting and/or standing);Cardiopulmonary status limiting activity;Impaired UE functional use   OT Treatment/Interventions: Self-care/ADL training;Therapeutic exercise;Neuromuscular education;Energy conservation;DME and/or AE instruction;Therapeutic activities;Patient/family education;Balance training      OT Goals(Current goals can be found in the care plan section)   Acute Rehab OT Goals Patient Stated Goal: to go home soon OT Goal Formulation: With patient Time For Goal Achievement: 04/09/24 Potential to Achieve Goals: Good ADL Goals Pt Will Perform Upper Body Bathing: with supervision;sitting Pt Will Perform Lower Body Bathing: with contact guard assist;sit to/from stand Pt Will Perform Upper Body Dressing: with contact guard assist;sitting Pt Will Perform Lower Body Dressing: with contact guard assist;sit to/from stand;with adaptive equipment Pt Will Transfer to Toilet: with contact guard assist;bedside commode;ambulating  Pt Will Perform Toileting - Clothing Manipulation and hygiene: with contact guard assist;sitting/lateral leans Pt Will Perform Tub/Shower Transfer: with contact guard assist;Stand pivot transfer;shower seat Pt/caregiver will Perform Home Exercise Program: Increased strength;With Supervision;With written HEP provided;Both  right and left upper extremity   OT Frequency:  Min 2X/week       AM-PAC OT 6 Clicks Daily Activity     Outcome Measure Help from another person eating meals?: A Little Help from another person taking care of personal grooming?: A Little Help from another person toileting, which includes using toliet, bedpan, or urinal?: A Little Help from another person bathing (including washing, rinsing, drying)?: A Lot Help from another person to put on and taking off regular upper body clothing?: A Little Help from another person to put on and taking off regular lower body clothing?: A Lot 6 Click Score: 16   End of Session Equipment Utilized During Treatment: Gait belt;Rolling walker (2 wheels) Nurse Communication: Mobility status  Activity Tolerance: Patient limited by fatigue Patient left: in bed;with call bell/phone within reach;with bed alarm set;with family/visitor present  OT Visit Diagnosis: Unsteadiness on feet (R26.81);Muscle weakness (generalized) (M62.81);History of falling (Z91.81)                Time: 8577-8562 OT Time Calculation (min): 15 min Charges:  OT General Charges $OT Visit: 1 Visit OT Evaluation $OT Eval Low Complexity: 1 Low  Brynnley Dayrit OT/L Acute Rehabilitation Department  [phone]  03/26/2024, 3:10 PM

## 2024-03-27 DIAGNOSIS — J9621 Acute and chronic respiratory failure with hypoxia: Secondary | ICD-10-CM | POA: Diagnosis not present

## 2024-03-27 LAB — BASIC METABOLIC PANEL WITH GFR
Anion gap: 10 (ref 5–15)
BUN: 38 mg/dL — ABNORMAL HIGH (ref 8–23)
CO2: 33 mmol/L — ABNORMAL HIGH (ref 22–32)
Calcium: 8.7 mg/dL — ABNORMAL LOW (ref 8.9–10.3)
Chloride: 96 mmol/L — ABNORMAL LOW (ref 98–111)
Creatinine, Ser: 1.04 mg/dL — ABNORMAL HIGH (ref 0.44–1.00)
GFR, Estimated: 50 mL/min — ABNORMAL LOW
Glucose, Bld: 107 mg/dL — ABNORMAL HIGH (ref 70–99)
Potassium: 3.8 mmol/L (ref 3.5–5.1)
Sodium: 138 mmol/L (ref 135–145)

## 2024-03-27 LAB — CBC
HCT: 34 % — ABNORMAL LOW (ref 36.0–46.0)
Hemoglobin: 10.7 g/dL — ABNORMAL LOW (ref 12.0–15.0)
MCH: 28.9 pg (ref 26.0–34.0)
MCHC: 31.5 g/dL (ref 30.0–36.0)
MCV: 91.9 fL (ref 80.0–100.0)
Platelets: 331 K/uL (ref 150–400)
RBC: 3.7 MIL/uL — ABNORMAL LOW (ref 3.87–5.11)
RDW: 14.1 % (ref 11.5–15.5)
WBC: 11.9 K/uL — ABNORMAL HIGH (ref 4.0–10.5)
nRBC: 0 % (ref 0.0–0.2)

## 2024-03-27 NOTE — Progress Notes (Signed)
 " PROGRESS NOTE [name]  FMW:[id number] DOB: March 09, 1930 DOA: 03/22/2024 PCP: Wendolyn Jenkins Jansky, MD  Brief Narrative/Hospital Course: [name] is a 89 y.o. female with PMH of for asthma, chronic respiratory failure with hypoxia in [address] at baseline, GCA/PMR on Actemra  and chronic prednisone  5 mg daily, CKD stage IIIa, HTN, RLS, B12 deficiency, depression/anxiety, dementia, liver mass/transverse colon thickening (presumed neoplasm/metastatic disease -biopsy not pursued) who is admitted with acute on chronic hypoxic respiratory failure likely secondary to new diagnosis of congestive heart failure after presenting on 03/22/2024 with chronic cough shortness of breath, productive cough-d proBNP 23,103, troponin T 96 >85, WBC 8.9, hemoglobin 9.4, platelets 278, sodium 142, potassium 4.5, bicarb 28, BUN 22, creatinine 0.95, serum glucose 105.  X-ray showed pulmonary edema moderate left pleural effusion possible LLL consolidation.\ 1/6-TTE: EF 35-40%, GHK.  And cardiology was consulted  Subjective: Seen and examined Getting nebulizer treatment this morning, reports breathing overall improving, she does have bouts of cough and has wheezing during cough Overnight remains afebrile on 1 L nasal cannula BP stable Labs reveal stable creatinine WBC improving 11.9  Assessment and plan:  Acute systolic CHF-new diagnosis LLL consilidation/acute pulmonary edema/left pleural effusion: Echo-low EF 35-40%, cardiology has been consulted. Son passed away last 13-Apr-2024- ? If stress induced  Managed w/Lasix  IV> changed to PO 1/10- On GDMT- added Coreg , Entresto , Aldactone  1/8>>  cardiology s/o. repeat  CXR 1/9:small effusion so will hold off on thoracentesis especially given ongoing coughing spells. Xr- shows Interval increase in the atelectasis or infiltrate in the right base-optimize pulmonary status increase pulmonary toileting, add incentive submitter, flutter valve Cont to monitor daily I/O,weight, electrolytes  and net balance.Keep on  salt/fluid restricted diet and monitor in tele. Net IO Since Admission: -2,274 mL [03/27/24 1101] Improving as below Filed Weights   03/25/24 0548 03/26/24 0346 03/27/24 0501  Weight: 73.1 kg 73.6 kg 73.8 kg    Acute on chronic respiratory failure with hypoxia on 2l [place] at baseline: Community-acquired pneumonia Immunocompromise status Asthma/?? Acute asthma exacerbation: Continue Brovana  Pulmicort , bronchodilators and steroid taper along with antitussives: Continue empiric antibiotics-ceftriaxone /azithromycin .has mild leukocytosis but overall stable, continue 02 -currently at home setting  Strep pneumoniae/Legionella negative.  Continue pulmonary toileting as above  Hypertension: BP while controlled. Holding home Norvasc . Continue GDMT as above.  Continue aspirin    Giant cell arteritis/polymyalgia rheumatica: Chronically on Prednisone  5 mg,on Actemra  iv q28d- last one in Nov- was held due to being on antibiotics-next appointment in February. Continue steroid as above.   AKI on CKD stage IIIa Creatinine improved to baseline from peak of 1.4.Cont to monitor while on diuretics  Recent Labs    01/02/24 0835 01/03/24 0907 02/25/24 1142 02/26/24 0447 03/22/24 1609 03/23/24 0401 03/24/24 0354 03/25/24 0414 03/26/24 0401 03/27/24 0358  BUN 28* 29* 15 18 22  24* 30* 29* 30* 38*  CREATININE 1.43* 1.03* 0.82 0.90 0.95 0.92 1.41* 1.00 1.05* 1.04*  CO2 28 27 29 28 28  32 31 39* 35* 33*  K 5.0 4.9 4.4 4.6 4.5 5.0 4.9 4.3 4.3 3.8    Normocytic anemia Iron deficiency anemia: no acute blood loss noticed hemoglobin stable, added oral iron supplement. Anemia panel done.  Dementia Anxiety/Depression RLS: Continue home regimen Abilify , bupropion , sertraline , Requip , PRN Valium  along with her Namenda , keep in delirium precaution fall precaution   Liver mass/circumferential wall thickening of the transverse colon: Noted on prior CT imaging of 12/30/2023.Findings on CT  concerning for primary neoplasm or metastatic disease. OF NOTE patient and family  noted to have deferred workup as she would not want to pursue any oncological treatment.  Class I Obesity w/ Body mass index is 28.82 kg/m.: Will benefit with PCP follow-up, weight loss  Mobility: PT Orders: Active PT Follow up Rec: No Pt Follow Up (Daughter Declined Hhpt - Reports She Will Get Her Up And Move Her)03/26/2024 1443    DVT prophylaxis: enoxaparin  (LOVENOX ) injection 40 mg Start: 03/25/24 2000 Code Status:   Code Status: Limited: Do not attempt resuscitation (DNR) -DNR-LIMITED -Do Not Intubate/DNI  Family Communication: plan of care discussed with patient/ daughter not at bedside today. Patient status is: Remains hospitalized because of severity of illness Level of care: Telemetry   Dispo: The patient is from: HOME w/ son who passes last saturday            Anticipated disposition: HOME 24 hrs Objective: Vitals last 24 hrs: Vitals:   03/26/24 2049 03/27/24 0501 03/27/24 0848 03/27/24 0947  BP: 106/66 (!) 110/50 124/60   Pulse: 66 63 68   Resp: 17 16    Temp: 98 F (36.7 C) 97.8 F (36.6 C)    TempSrc: Oral Oral    SpO2: 96% 98%  97%  Weight:  73.8 kg    Height:        Physical Examination: General exam: AAO, pleasant, not in distress, obese HEENT:Oral mucosa moist, Ear/Nose WNL grossly Respiratory system: Bilaterally clear breath sounds with occasional wheezing especially on coughing spell,no use of accessory muscle Cardiovascular system: S1 & S2 +, No JVD. Gastrointestinal system: Abdomen soft,NT,ND, BS+ Nervous System: Alert, awake, moving all extremities,and following commands. Extremities: extremities warm, leg edema mild improving with skin wrinkling Skin: Warm, no rashes MSK: Normal muscle bulk,tone, power   Medications reviewed:  Scheduled Meds:  arformoterol   15 mcg Nebulization BID   ARIPiprazole   2 mg Oral Daily   aspirin  EC  81 mg Oral Daily   budesonide   (PULMICORT ) nebulizer solution  0.5 mg Nebulization BID   buPROPion   150 mg Oral q AM   calcium -vitamin D   1 tablet Oral Q breakfast   carvedilol   6.25 mg Oral BID WC   cholecalciferol   2,000 Units Oral Daily   enoxaparin  (LOVENOX ) injection  40 mg Subcutaneous Q24H   ferrous sulfate   325 mg Oral Q breakfast   folic acid   1 mg Oral Daily   furosemide   40 mg Oral Daily   guaiFENesin   600 mg Oral BID   loratadine   10 mg Oral Daily   memantine   5 mg Oral BID   omega-3 acid ethyl esters  2 capsule Oral BID   pantoprazole   40 mg Oral Daily   rOPINIRole   1 mg Oral Daily   rOPINIRole   2 mg Oral QHS   sacubitril -valsartan   1 tablet Oral BID   sertraline   50 mg Oral q AM   sodium chloride  flush  3 mL Intravenous Q12H   spironolactone   12.5 mg Oral Daily   Continuous Infusions:   Diet: Diet Order             Diet Heart Fluid consistency: Thin  Diet effective now                   Data Reviewed: I have personally reviewed following labs and imaging studies ( see epic result tab) CBC: Recent Labs  Lab 03/23/24 0401 03/24/24 0354 03/25/24 0414 03/26/24 0401 03/27/24 0358  WBC 6.8 9.2 9.9 12.3* 11.9*  NEUTROABS  --  8.0*  --   --   --  HGB 9.7* 9.4* 9.8* 9.7* 10.7*  HCT 31.8* 30.5* 31.1* 29.7* 34.0*  MCV 97.2 96.2 94.0 92.2 91.9  PLT 299 311 327 340 331   CMP: Recent Labs  Lab 03/23/24 0401 03/24/24 0354 03/25/24 0414 03/26/24 0401 03/27/24 0358  NA 144 138 140 139 138  K 5.0 4.9 4.3 4.3 3.8  CL 103 97* 95* 97* 96*  CO2 32 31 39* 35* 33*  GLUCOSE 127* 158* 123* 96 107*  BUN 24* 30* 29* 30* 38*  CREATININE 0.92 1.41* 1.00 1.05* 1.04*  CALCIUM  9.4 9.3 9.4 8.8* 8.7*  MG 2.2 2.4  --   --   --    GFR: Estimated Creatinine Clearance: 33.2 mL/min (A) (by C-G formula based on SCr of 1.04 mg/dL (H)). No results for input(s): AST, ALT, ALKPHOS, BILITOT, PROT, ALBUMIN in the last 168 hours. No results for input(s): LIPASE, AMYLASE in the last 168 hours. No  results for input(s): AMMONIA in the last 168 hours. Coagulation Profile: No results for input(s): INR, PROTIME in the last 168 hours. Unresulted Labs (From admission, onward)     Start     Ordered   03/27/24 0500  Basic metabolic panel with GFR  Daily,   R     Question:  Specimen collection method  Answer:  Lab=Lab collect   03/26/24 0849           Antimicrobials/Microbiology: Anti-infectives (From admission, onward)    Start     Dose/Rate Route Frequency Ordered Stop   03/23/24 1845  azithromycin  (ZITHROMAX ) 500 mg in sodium chloride  0.9 % 250 mL IVPB  Status:  Discontinued        500 mg 250 mL/hr over 60 Minutes Intravenous Every 24 hours 03/22/24 2109 03/23/24 1258   03/23/24 1830  cefTRIAXone  (ROCEPHIN ) 2 g in sodium chloride  0.9 % 100 mL IVPB        2 g 200 mL/hr over 30 Minutes Intravenous Every 24 hours 03/22/24 2109 03/26/24 1737   03/23/24 1800  azithromycin  (ZITHROMAX ) tablet 500 mg        500 mg Oral Daily-1800 03/23/24 1258 03/26/24 1705   03/22/24 1800  cefTRIAXone  (ROCEPHIN ) 2 g in sodium chloride  0.9 % 100 mL IVPB        2 g 200 mL/hr over 30 Minutes Intravenous  Once 03/22/24 1752 03/22/24 1857   03/22/24 1800  azithromycin  (ZITHROMAX ) 500 mg in sodium chloride  0.9 % 250 mL IVPB        500 mg 250 mL/hr over 60 Minutes Intravenous  Once 03/22/24 1752 03/22/24 1946         Component Value Date/Time   SDES  02/25/2024 1111    URINE, CLEAN CATCH Performed at [facility], [address], [place], [place] [address]    [facility]  02/25/2024 1111    NONE Performed at [facility], [address], [place], [place] [address]    CULT >=100,000 COLONIES/mL ENTEROBACTER CLOACAE (A) 02/25/2024 1111   REPTSTATUS 02/28/2024 FINAL 02/25/2024 1111    Procedures:    Mennie LAMY, MD [facility] 03/27/2024, 11:01 AM   "

## 2024-03-27 NOTE — Plan of Care (Signed)

## 2024-03-28 LAB — BASIC METABOLIC PANEL WITH GFR
Anion gap: 8 (ref 5–15)
BUN: 38 mg/dL — ABNORMAL HIGH (ref 8–23)
CO2: 34 mmol/L — ABNORMAL HIGH (ref 22–32)
Calcium: 8.9 mg/dL (ref 8.9–10.3)
Chloride: 96 mmol/L — ABNORMAL LOW (ref 98–111)
Creatinine, Ser: 1.24 mg/dL — ABNORMAL HIGH (ref 0.44–1.00)
GFR, Estimated: 41 mL/min — ABNORMAL LOW
Glucose, Bld: 147 mg/dL — ABNORMAL HIGH (ref 70–99)
Potassium: 4.1 mmol/L (ref 3.5–5.1)
Sodium: 138 mmol/L (ref 135–145)

## 2024-03-28 MED ORDER — PREDNISONE 20 MG PO TABS: 40.0000 mg | ORAL_TABLET | Freq: Every day | ORAL | Status: DC

## 2024-03-28 MED ORDER — PREDNISONE 20 MG PO TABS
30.0000 mg | ORAL_TABLET | Freq: Every day | ORAL | Status: DC
  Administered 2024-03-28 – 2024-03-29 (×2): 30 mg via ORAL
  Filled 2024-03-28 (×2): qty 1

## 2024-03-28 MED ORDER — ENOXAPARIN SODIUM 30 MG/0.3ML IJ SOSY
30.0000 mg | PREFILLED_SYRINGE | INTRAMUSCULAR | Status: DC
  Administered 2024-03-28: 30 mg via SUBCUTANEOUS
  Filled 2024-03-28: qty 0.3

## 2024-03-28 NOTE — Progress Notes (Signed)
 " PROGRESS NOTE [name]  FMW:[id number] DOB: 1930-02-06 DOA: 03/22/2024 PCP: Wendolyn Jenkins Jansky, MD  Brief Narrative/Hospital Course: [name] is a 89 y.o. female with PMH of for asthma, chronic respiratory failure with hypoxia in [address] at baseline, GCA/PMR on Actemra  and chronic prednisone  5 mg daily, CKD stage IIIa, HTN, RLS, B12 deficiency, depression/anxiety, dementia, liver mass/transverse colon thickening (presumed neoplasm/metastatic disease -biopsy not pursued) who is admitted with acute on chronic hypoxic respiratory failure likely secondary to new diagnosis of congestive heart failure after presenting on 03/22/2024 with chronic cough shortness of breath, productive cough-d proBNP 23,103, troponin T 96 >85, WBC 8.9, hemoglobin 9.4, platelets 278, sodium 142, potassium 4.5, bicarb 28, BUN 22, creatinine 0.95, serum glucose 105.  X-ray showed pulmonary edema moderate left pleural effusion possible LLL consolidation.\ 1/6-TTE: EF 35-40%, GHK.  And cardiology was consulted  Subjective: Seen and examined On bedside chair Overnight afebrile BP stable Creatinine at 1.2, on 1 L oxygen , on Lasix  40 daily, weight down to 160lb ln scale on admit 170.  Assessment and plan:  Acute systolic CHF-new diagnosis LLL consilidation/acute pulmonary edema/left pleural effusion: Echo-low EF 35-40%, cardiology has been consulted. Son passed away last 04-May-2024- ? If stress induced  Managed w/Lasix  IV> now on Oral lasix  40 daily hold today due to bump In creatininie Cont GDMT-  added Coreg , Entresto , Aldactone  1/8>> cardiology s/o. repeat  CXR 1/9:small effusion so will hold off on thoracentesis especially given ongoing coughing spells. Xr- shows Interval increase in the atelectasis or infiltrate in the right base-optimize pulmonary status increase pulmonary toileting, cont IS weight down to 160lb ln scale on admit 170 and feels much better and eager to go home today Net IO Since Admission: -2,154 mL  [03/28/24 1043] Improving as below  Acute on chronic respiratory failure with hypoxia on 2l [place] at baseline: Community-acquired pneumonia Immunocompromise status Asthma/?? Acute asthma exacerbation: continue 02 -currently at home setting Strep pneumoniae/Legionella negative. Patient completed ceftriaxone  azithromycin .  Cont nebs at home.  Hypertension: controlled. Holding home Norvasc . Continue GDMT as above.  Continue aspirin    Giant cell arteritis/polymyalgia rheumatica: Chronically on Prednisone  5 mg,on Actemra  iv q28d- last one in Nov- was held due to being on antibiotics-next appointment in February.   AKI on CKD stage IIIa Creatinine slightly up at 1.2 from 1. Peak of 1.4  Recent Labs    01/03/24 0907 02/25/24 1142 02/26/24 0447 03/22/24 1609 03/23/24 0401 03/24/24 0354 03/25/24 0414 03/26/24 0401 03/27/24 0358 03/28/24 0339  BUN 29* 15 18 22  24* 30* 29* 30* 38* 38*  CREATININE 1.03* 0.82 0.90 0.95 0.92 1.41* 1.00 1.05* 1.04* 1.24*  CO2 27 29 28 28  32 31 39* 35* 33* 34*  K 4.9 4.4 4.6 4.5 5.0 4.9 4.3 4.3 3.8 4.1    Normocytic anemia Iron deficiency anemia: no acute blood loss noticed hemoglobin stable, added oral iron supplement. Anemia panel done.  Dementia Anxiety/Depression RLS: Continue home regimen Abilify , bupropion , sertraline , Requip , PRN Valium  along with her Namenda , keep in delirium precaution fall precaution   Liver mass/circumferential wall thickening of the transverse colon: Noted on prior CT imaging of 12/30/2023.Findings on CT concerning for primary neoplasm or metastatic disease. OF NOTE patient and family noted to have deferred workup as she would not want to pursue any oncological treatment.  Class I Obesity w/ Body mass index is 28.51 kg/m.: Will benefit with PCP follow-up, weight loss  Mobility: PT Orders: Active PT Follow up Rec: No Pt Follow Up (Daughter Declined Hhpt -  Reports She Will Get Her Up And Move Her)03/26/2024 1443    DVT  prophylaxis: enoxaparin  (LOVENOX ) injection 40 mg Start: 03/25/24 2000 Code Status:   Code Status: Limited: Do not attempt resuscitation (DNR) -DNR-LIMITED -Do Not Intubate/DNI  Family Communication: plan of care discussed with patient/ daughter at bedside today. Patient status is: Remains hospitalized because of severity of illness Level of care: Telemetry   Dispo: The patient is from: HOME w/ son who passes last saturday            Anticipated disposition: Home tomorrow if creat is better  Objective: Vitals last 24 hrs: Vitals:   03/28/24 0527 03/28/24 0753 03/28/24 0754 03/28/24 1000  BP: 123/60     Pulse: 64     Resp: 20     Temp: 97.7 F (36.5 C)     TempSrc: Oral     SpO2: 99% 95% 95%   Weight: 76 kg   73 kg  Height:        Physical Examination: General exam: AAO at abselin HEENT:Oral mucosa moist, Ear/Nose WNL grossly Respiratory system: Bilaterally  clear occasional wheezing Cardiovascular system: S1 & S2 +, No JVD. Gastrointestinal system: Abdomen soft,NT,ND, BS+ Nervous System: Alert, awake, moving all extremities,and following commands. Extremities: extremities warm, leg edema mild improving with skin wrinkling Skin: Warm, no rashes MSK: Normal muscle bulk,tone, power   Medications reviewed:  Scheduled Meds:  arformoterol   15 mcg Nebulization BID   ARIPiprazole   2 mg Oral Daily   aspirin  EC  81 mg Oral Daily   budesonide  (PULMICORT ) nebulizer solution  0.5 mg Nebulization BID   buPROPion   150 mg Oral q AM   calcium -vitamin D   1 tablet Oral Q breakfast   carvedilol   6.25 mg Oral BID WC   cholecalciferol   2,000 Units Oral Daily   enoxaparin  (LOVENOX ) injection  40 mg Subcutaneous Q24H   ferrous sulfate   325 mg Oral Q breakfast   folic acid   1 mg Oral Daily   guaiFENesin   600 mg Oral BID   loratadine   10 mg Oral Daily   memantine   5 mg Oral BID   omega-3 acid ethyl esters  2 capsule Oral BID   pantoprazole   40 mg Oral Daily   predniSONE   30 mg Oral Q  breakfast   rOPINIRole   1 mg Oral Daily   rOPINIRole   2 mg Oral QHS   sacubitril -valsartan   1 tablet Oral BID   sertraline   50 mg Oral q AM   sodium chloride  flush  3 mL Intravenous Q12H   spironolactone   12.5 mg Oral Daily   Continuous Infusions:   Diet: Diet Order             Diet Heart Fluid consistency: Thin  Diet effective now                   Data Reviewed: I have personally reviewed following labs and imaging studies ( see epic result tab) CBC: Recent Labs  Lab 03/23/24 0401 03/24/24 0354 03/25/24 0414 03/26/24 0401 03/27/24 0358  WBC 6.8 9.2 9.9 12.3* 11.9*  NEUTROABS  --  8.0*  --   --   --   HGB 9.7* 9.4* 9.8* 9.7* 10.7*  HCT 31.8* 30.5* 31.1* 29.7* 34.0*  MCV 97.2 96.2 94.0 92.2 91.9  PLT 299 311 327 340 331   CMP: Recent Labs  Lab 03/23/24 0401 03/24/24 0354 03/25/24 0414 03/26/24 0401 03/27/24 0358 03/28/24 0339  NA 144 138 140 139 138  138  K 5.0 4.9 4.3 4.3 3.8 4.1  CL 103 97* 95* 97* 96* 96*  CO2 32 31 39* 35* 33* 34*  GLUCOSE 127* 158* 123* 96 107* 147*  BUN 24* 30* 29* 30* 38* 38*  CREATININE 0.92 1.41* 1.00 1.05* 1.04* 1.24*  CALCIUM  9.4 9.3 9.4 8.8* 8.7* 8.9  MG 2.2 2.4  --   --   --   --    GFR: Estimated Creatinine Clearance: 27.7 mL/min (A) (by C-G formula based on SCr of 1.24 mg/dL (H)). No results for input(s): AST, ALT, ALKPHOS, BILITOT, PROT, ALBUMIN in the last 168 hours. No results for input(s): LIPASE, AMYLASE in the last 168 hours. No results for input(s): AMMONIA in the last 168 hours. Coagulation Profile: No results for input(s): INR, PROTIME in the last 168 hours. Unresulted Labs (From admission, onward)    None      Antimicrobials/Microbiology: Anti-infectives (From admission, onward)    Start     Dose/Rate Route Frequency Ordered Stop   03/23/24 1845  azithromycin  (ZITHROMAX ) 500 mg in sodium chloride  0.9 % 250 mL IVPB  Status:  Discontinued        500 mg 250 mL/hr over 60 Minutes  Intravenous Every 24 hours 03/22/24 2109 03/23/24 1258   03/23/24 1830  cefTRIAXone  (ROCEPHIN ) 2 g in sodium chloride  0.9 % 100 mL IVPB        2 g 200 mL/hr over 30 Minutes Intravenous Every 24 hours 03/22/24 2109 03/26/24 1737   03/23/24 1800  azithromycin  (ZITHROMAX ) tablet 500 mg        500 mg Oral Daily-1800 03/23/24 1258 03/26/24 1705   03/22/24 1800  cefTRIAXone  (ROCEPHIN ) 2 g in sodium chloride  0.9 % 100 mL IVPB        2 g 200 mL/hr over 30 Minutes Intravenous  Once 03/22/24 1752 03/22/24 1857   03/22/24 1800  azithromycin  (ZITHROMAX ) 500 mg in sodium chloride  0.9 % 250 mL IVPB        500 mg 250 mL/hr over 60 Minutes Intravenous  Once 03/22/24 1752 03/22/24 1946         Component Value Date/Time   SDES  02/25/2024 1111    URINE, CLEAN CATCH Performed at [facility], [address] [place], [place] [address]    [facility]  02/25/2024 1111    NONE Performed at [facility], [address], [place], [place] [address]    CULT >=100,000 COLONIES/mL ENTEROBACTER CLOACAE (A) 02/25/2024 1111   REPTSTATUS 02/28/2024 FINAL 02/25/2024 1111    Procedures:    Mennie LAMY, MD [facility] 03/28/2024, 10:43 AM   "

## 2024-03-29 ENCOUNTER — Other Ambulatory Visit (HOSPITAL_BASED_OUTPATIENT_CLINIC_OR_DEPARTMENT_OTHER): Payer: Self-pay

## 2024-03-29 ENCOUNTER — Other Ambulatory Visit (HOSPITAL_COMMUNITY): Payer: Self-pay

## 2024-03-29 LAB — BASIC METABOLIC PANEL WITH GFR
Anion gap: 10 (ref 5–15)
BUN: 34 mg/dL — ABNORMAL HIGH (ref 8–23)
CO2: 32 mmol/L (ref 22–32)
Calcium: 10.1 mg/dL (ref 8.9–10.3)
Chloride: 96 mmol/L — ABNORMAL LOW (ref 98–111)
Creatinine, Ser: 1.11 mg/dL — ABNORMAL HIGH (ref 0.44–1.00)
GFR, Estimated: 46 mL/min — ABNORMAL LOW
Glucose, Bld: 112 mg/dL — ABNORMAL HIGH (ref 70–99)
Potassium: 4.2 mmol/L (ref 3.5–5.1)
Sodium: 139 mmol/L (ref 135–145)

## 2024-03-29 MED ORDER — SACUBITRIL-VALSARTAN 24-26 MG PO TABS
1.0000 | ORAL_TABLET | Freq: Two times a day (BID) | ORAL | 0 refills | Status: AC
  Filled 2024-03-29: qty 60, 30d supply, fill #0

## 2024-03-29 MED ORDER — PREDNISONE 10 MG PO TABS
ORAL_TABLET | ORAL | 0 refills | Status: DC
  Filled 2024-03-29: qty 12, 6d supply, fill #0

## 2024-03-29 MED ORDER — FERROUS SULFATE 325 (65 FE) MG PO TABS
325.0000 mg | ORAL_TABLET | Freq: Every day | ORAL | 0 refills | Status: AC
  Filled 2024-03-29: qty 30, 30d supply, fill #0

## 2024-03-29 MED ORDER — SPIRONOLACTONE 25 MG PO TABS
12.5000 mg | ORAL_TABLET | Freq: Every day | ORAL | 0 refills | Status: AC
  Filled 2024-03-29: qty 15, 30d supply, fill #0

## 2024-03-29 MED ORDER — CARVEDILOL 6.25 MG PO TABS
6.2500 mg | ORAL_TABLET | Freq: Two times a day (BID) | ORAL | 0 refills | Status: DC
  Filled 2024-03-29: qty 60, 30d supply, fill #0

## 2024-03-29 NOTE — Progress Notes (Signed)
 Discharge meds in a secure bag delivered to patient by this RN.  Delay in discharge delivery due to [employer] refusal to pay for meds- patient was listed as having a secondary insurance. This RN confirmed with Krithika & her daughter that patient only has [employer] & Medicare part A/B, no part D. Patient's daughter given phone # and website info to follow up with [employer]. Cash price used for patient's meds- patient also uses [facility] and they confirmed they also use cash pricing.

## 2024-03-29 NOTE — Discharge Summary (Signed)
 Physician Discharge Summary  [name] FMW:[id number] DOB: 02-14-1930 DOA: 03/22/2024  PCP: Wendolyn Jenkins Jansky, MD  Admit date: 03/22/2024 Discharge date: 03/29/2024 Recommendations for Outpatient Follow-up:  Follow up with PCP in 1 weeks-call for appointment Please obtain BMP/CBC in one week  Discharge Dispo: home Discharge Condition: Stable Code Status:   Code Status: Limited: Do not attempt resuscitation (DNR) -DNR-LIMITED -Do Not Intubate/DNI  Diet recommendation:  Diet Order             Diet Heart Fluid consistency: Thin  Diet effective now                    Brief/Interim Summary: [name] is a 89 y.o. female with PMH of for asthma, chronic respiratory failure with hypoxia in [address] at baseline, GCA/PMR on Actemra  and chronic prednisone  5 mg daily, CKD stage IIIa, HTN, RLS, B12 deficiency, depression/anxiety, dementia, liver mass/transverse colon thickening (presumed neoplasm/metastatic disease -biopsy not pursued) who is admitted with acute on chronic hypoxic respiratory failure likely secondary to new diagnosis of congestive heart failure after presenting on 03/22/2024 with chronic cough shortness of breath, productive cough-d proBNP 23,103, troponin T 96 >85, WBC 8.9, hemoglobin 9.4, platelets 278, sodium 142, potassium 4.5, bicarb 28, BUN 22, creatinine 0.95, serum glucose 105.  X-ray showed pulmonary edema moderate left pleural effusion possible LLL consolidation.\ 1/6-TTE: EF 35-40%, GHK.  And cardiology was consulted Patient was diuresed, at this time volume status improved, overall doing well Also having cough issues, creatinine has stabilized, she will continue steroid taper follow-up with PCP monitor electrolytes as outpatient and at this time stable for discharge home with home health  Subjective: Seen and examined Alert awake oriented fairly on the bedside chair Overnight afebrile BP stable on 1 L nasal cannula, labs pending -subsequently resulted in  improved Eager to go home today Discharge Diagnoses:   Acute systolic CHF-new diagnosis LLL consilidation/acute pulmonary edema/left pleural effusion: Echo-low EF 35-40%, cardiology has been consulted. Son passed away last 27-Apr-2024- ? If stress induced  Managed w/Lasix  IV> now on Oral lasix  40 daily hold today due to bump In creatininie Cont GDMT-  added Coreg , Entresto , Aldactone  1/8>> cardiology s/o.  Continue current GDMT repeat  CXR 1/9:small effusion so will hold off on thoracentesis especially given ongoing coughing spells.  Xr- shows Interval increase in the atelectasis or infiltrate in the right base-optimize pulmonary status increase pulmonary toileting, cont IS. Net IO Since Admission: -3,124 mL [03/29/24 1041]   Acute on chronic respiratory failure with hypoxia on 2l [place] at baseline: Community-acquired pneumonia Immunocompromise status Asthma/?? Acute asthma exacerbation: continue 02 -currently at home setting Strep pneumoniae/Legionella negative.Patient completed ceftriaxone  azithromycin .  Cont nebs at home.  Hypertension: controlled. Holding home Norvasc . Continue GDMT as above.  Continue aspirin    Giant cell arteritis/polymyalgia rheumatica: Chronically on Prednisone  5 mg,on Actemra  iv q28d- last one in Nov- was held due to being on antibiotics-next appointment in February.   AKI on CKD stage IIIa Creatinine improved this morning 1.1. Peak of 1.4  Recent Labs    02/25/24 1142 02/26/24 0447 03/22/24 1609 03/23/24 0401 03/24/24 0354 03/25/24 0414 03/26/24 0401 03/27/24 0358 03/28/24 0339 03/29/24 0922  BUN 15 18 22  24* 30* 29* 30* 38* 38* 34*  CREATININE 0.82 0.90 0.95 0.92 1.41* 1.00 1.05* 1.04* 1.24* 1.11*  CO2 29 28 28  32 31 39* 35* 33* 34* 32  K 4.4 4.6 4.5 5.0 4.9 4.3 4.3 3.8 4.1 4.2    Normocytic anemia Iron deficiency  anemia: no acute blood loss noticed hemoglobin stable, added oral iron supplement. Anemia panel  done.  Dementia Anxiety/Depression RLS: Continue home regimen Abilify , bupropion , sertraline , Requip , PRN Valium  along with her Namenda , keep in delirium precaution fall precaution   Liver mass/circumferential wall thickening of the transverse colon: Noted on prior CT imaging of 12/30/2023.Findings on CT concerning for primary neoplasm or metastatic disease. OF NOTE patient and family noted to have deferred workup as she would not want to pursue any oncological treatment.  Class I Obesity w/ Body mass index is 28.59 kg/m.: Will benefit with PCP follow-up, weight loss  Mobility: PT Orders: Active PT Follow up Rec: No Pt Follow Up (Daughter Declined Hhpt - Reports She Will Get Her Up And Move Her)03/26/2024 1443    DVT prophylaxis: enoxaparin  (LOVENOX ) injection 30 mg Start: 03/28/24 2000 Code Status:   Code Status: Limited: Do not attempt resuscitation (DNR) -DNR-LIMITED -Do Not Intubate/DNI  Family Communication: plan of care discussed with patient/ daughter at bedside today. Patient status is: Remains hospitalized because of severity of illness Level of care: Telemetry   Dispo: The patient is from: HOME w/ son who passes last saturday            Anticipated disposition: Home  Objective: Vitals last 24 hrs: Vitals:   03/28/24 2029 03/29/24 0456 03/29/24 0500 03/29/24 0814  BP: (!) 127/50 (!) 138/45    Pulse: 60 63    Resp: 18 (!) 21    Temp: 98.3 F (36.8 C) 97.7 F (36.5 C)    TempSrc: Oral Oral    SpO2: 97% 98%  98%  Weight:   73.2 kg   Height:        Physical Examination: General exam: AAO at abselin HEENT:Oral mucosa moist, Ear/Nose WNL grossly Respiratory system: Bilaterally  clear occasional wheezing Cardiovascular system: S1 & S2 +, No JVD. Gastrointestinal system: Abdomen soft,NT,ND, BS+ Nervous System: Alert, awake, moving all extremities,and following commands. Extremities: extremities warm, leg edema mild improving with skin wrinkling Skin: Warm, no  rashes MSK: Normal muscle bulk,tone, power    Pressure Ulcer: Present on admission stage II sacrum, continue offloading, dressing change Wound 03/22/24 2100 Pressure Injury Sacrum Stage 2 -  Partial thickness loss of dermis presenting as a shallow open injury with a red, pink wound bed without slough. (Active)      Consultation: See note.  Discharge Instructions  Discharge Instructions     (HEART FAILURE PATIENTS) Call MD:  Anytime you have any of the following symptoms: 1) 3 pound weight gain in 24 hours or 5 pounds in 1 week 2) shortness of breath, with or without a dry hacking cough 3) swelling in the hands, feet or stomach 4) if you have to sleep on extra pillows at night in order to breathe.   Complete by: As directed    Discharge instructions   Complete by: As directed    Please call call MD or return to ER for similar or worsening recurring problem that brought you to hospital or if any fever,nausea/vomiting,abdominal pain, uncontrolled pain, chest pain,  shortness of breath or any other alarming symptoms.  Please follow-up your doctor as instructed in a week time and call the office for appointment.  Please avoid alcohol , smoking, or any other illicit substance and maintain healthy habits including taking your regular medications as prescribed.  You were cared for by a hospitalist during your hospital stay. If you have any questions about your discharge medications or the care you received  while you were in the hospital after you are discharged, you can call the unit and ask to speak with the hospitalist on call if the hospitalist that took care of you is not available.  Once you are discharged, your primary care physician will handle any further medical issues. Please note that NO REFILLS for any discharge medications will be authorized once you are discharged, as it is imperative that you return to your primary care physician (or establish a relationship with a primary care  physician if you do not have one) for your aftercare needs so that they can reassess your need for medications and monitor your lab values   Increase activity slowly   Complete by: As directed    No wound care   Complete by: As directed       Allergies as of 03/29/2024       Reactions   Oxycodone  Itching, Other (See Comments)   Hallucinations   Statins Other (See Comments)   Myalgia    Advair Diskus [fluticasone -salmeterol] Other (See Comments)   Mouth sores   Atorvastatin  Other (See Comments)   Severe headache   Sulfonamide Derivatives Rash        Medication List     PAUSE taking these medications    predniSONE  5 MG tablet Wait to take this until: April 04, 2024 Commonly known as: DELTASONE  Take 1 tablet (5 mg total) by mouth at bedtime. Resume after finished with prednisone  taper from hospitalization You also have another medication with the same name that you may need to continue taking. What changed:  when to take this additional instructions       STOP taking these medications    amLODipine  2.5 MG tablet Commonly known as: NORVASC    ciprofloxacin  500 MG tablet Commonly known as: CIPRO    olmesartan  40 MG tablet Commonly known as: BENICAR        TAKE these medications    rOPINIRole  1 MG tablet Commonly known as: REQUIP  Take 1 tablet (1 mg total) by mouth as directed. TAKE 1 TABLET IN THE MORNING AND 2 TABLETS IN THE EVENING The timing of this medication is very important.   acetaminophen  500 MG tablet Commonly known as: TYLENOL  Take 1 tablet (500 mg total) by mouth 3 (three) times daily.   albuterol  (2.5 MG/3ML) 0.083% nebulizer solution Commonly known as: PROVENTIL  Take 3 mLs (2.5 mg total) by nebulization every 6 (six) hours as needed for wheezing or shortness of breath. What changed: when to take this   allopurinol 100 MG tablet Commonly known as: ZYLOPRIM Take 100 mg by mouth daily.   ARIPiprazole  2 MG tablet Commonly known as:  ABILIFY  TAKE 1 TABLET BY MOUTH EVERY DAY *NEW PRESCRIPTION REQUEST* What changed: See the new instructions.   Aspirin  Low Dose 81 MG tablet Generic drug: aspirin  EC TAKE 1 TABLET BY MOUTH EVERY DAY *NEW PRESCRIPTION REQUEST* What changed: See the new instructions.   budesonide  1 MG/2ML nebulizer solution Commonly known as: PULMICORT  INHALE THE CONTENTS OF 1 VIAL VIA NEBULIZER TWICE DAILY *NEW PRESCRIPTION REQUEST* What changed: See the new instructions.   buPROPion  150 MG 24 hr tablet Commonly known as: WELLBUTRIN  XL Take 1 tablet (150 mg total) by mouth in the morning.   calcium -vitamin D  500-200 MG-UNIT tablet Commonly known as: OSCAL WITH D Take 1 tablet by mouth 3 (three) times daily. What changed: when to take this   carvedilol  6.25 MG tablet Commonly known as: COREG  Take 1 tablet (6.25 mg total) by mouth  2 (two) times daily with a meal.   cephALEXin  250 MG capsule Commonly known as: KEFLEX  Take 1 capsule (250 mg total) by mouth daily. Resume after finished with antibiotics from hospitalization What changed:  when to take this additional instructions   cyanocobalamin  1000 MCG/ML injection Commonly known as: VITAMIN B12 Inject 1 mL (1,000 mcg total) into the muscle every 30 (thirty) days.   diazepam  5 MG tablet Commonly known as: VALIUM  Take 1 tablet (5 mg total) by mouth See admin instructions. Take 5 mg by mouth in the evening AS NEEDED for restlessness and an additional 5 mg once a day as needed for anxiety What changed:  how much to take when to take this reasons to take this additional instructions   docusate sodium  100 MG capsule Commonly known as: COLACE Take 1 capsule (100 mg total) by mouth daily. What changed: when to take this   estradiol 0.1 MG/GM vaginal cream Commonly known as: ESTRACE Place 1 Applicatorful vaginally daily as needed (burning).   ferrous sulfate  325 (65 FE) MG tablet Take 1 tablet (325 mg total) by mouth daily with  breakfast. Start taking on: March 30, 2024   folic acid  1 MG tablet Commonly known as: FOLVITE  Take 1 tablet (1 mg total) by mouth daily.   guaiFENesin  600 MG 12 hr tablet Commonly known as: MUCINEX  Take 600 mg by mouth 2 (two) times daily.   HYDROcodone -acetaminophen  5-325 MG tablet Commonly known as: NORCO/VICODIN Take 1 tablet by mouth every 6 (six) hours as needed for moderate pain (pain score 4-6). What changed: when to take this   memantine  5 MG tablet Commonly known as: NAMENDA  Take 1 tablet (5 mg total) by mouth in the morning and at bedtime.   omega-3 acid ethyl esters 1 g capsule Commonly known as: LOVAZA  Take 2 capsules (2 g total) by mouth 2 (two) times daily.   omeprazole  40 MG capsule Commonly known as: PRILOSEC TAKE ONE (1) CAPSULE BY MOUTH TWICE DAILY *NEW PRESCRIPTION REQUEST*   ondansetron  8 MG disintegrating tablet Commonly known as: ZOFRAN -ODT Take 1 tablet (8 mg total) by mouth every 8 (eight) hours as needed for nausea or vomiting. PLACE 1 TABLET ON TONGUE EVERY 8 HOURS AS NEEDED FOR NAUSEA OR VOMITING What changed:  when to take this additional instructions   polyethylene glycol 17 g packet Commonly known as: MIRALAX  / GLYCOLAX  Take 17 g by mouth daily.   predniSONE  10 MG tablet Commonly known as: DELTASONE  3 tabs daily x 2 days,2 tabs daily x 2 days,1 tab daily x 2 day s then resume home the dose Start taking on: March 30, 2024 What changed:  how much to take how to take this when to take this additional instructions   Probiotic Chew Chew 1 each by mouth every other day. What changed: when to take this   sacubitril -valsartan  24-26 MG Commonly known as: ENTRESTO  Take 1 tablet by mouth 2 (two) times daily.   sertraline  50 MG tablet Commonly known as: ZOLOFT  Take 1 tablet (50 mg total) by mouth in the morning.   Spiriva  Respimat 2.5 MCG/ACT Aers Generic drug: Tiotropium Bromide  Inhale 2 Inhalations into the lungs daily at 12  noon.   spironolactone  25 MG tablet Commonly known as: ALDACTONE  Take 0.5 tablets (12.5 mg total) by mouth daily. Start taking on: March 30, 2024   Systane 0.4-0.3 % Soln Generic drug: Polyethyl Glycol-Propyl Glycol Place 1 drop into both eyes 4 (four) times daily as needed (for dryness). What  changed: when to take this   tocilizumab  4 mg/kg in sodium chloride  0.9 % Inject 280 mg into the vein every 28 (twenty-eight) days.   Vitamin D  50 MCG (2000 UT) tablet Take 2,000 Units by mouth daily.        Follow-up Information     [facility] [place]) Follow up.   Specialty: Home Health Services Why: [facility] will resume PT, OT, and nursing after discharge. Contact information: [address] [place]  [address] [phone]        Lelon Hamilton T, PA-C Follow up.   Specialties: Cardiology, Physician Assistant Why: Hospital follow-up with Cardiology scheduled for 04/06/2024 at 2:45pm. Please arrive 20 minutes early for check-in. If this date/ time does not work for you, please call our office to reschedule. Contact information: [address] [place] [address] [phone]                Allergies[1]  The results of significant diagnostics from this hospitalization (including imaging, microbiology, ancillary and laboratory) are listed below for reference.    Microbiology: Recent Results (from the past 240 hours)  Resp panel by RT-PCR (RSV, Flu A&B, Covid) Anterior Nasal Swab     Status: None   Collection Time: 03/22/24  4:09 PM   Specimen: Anterior Nasal Swab  Result Value Ref Range Status   SARS Coronavirus 2 by RT PCR NEGATIVE NEGATIVE Final    Comment: (NOTE) SARS-CoV-2 target nucleic acids are NOT DETECTED.  The SARS-CoV-2 RNA is generally detectable in upper respiratory specimens during the acute phase of infection. The lowest concentration of SARS-CoV-2 viral copies this assay can detect  is 138 copies/mL. A negative result does not preclude SARS-Cov-2 infection and should not be used as the sole basis for treatment or other patient management decisions. A negative result may occur with  improper specimen collection/handling, submission of specimen other than nasopharyngeal swab, presence of viral mutation(s) within the areas targeted by this assay, and inadequate number of viral copies(<138 copies/mL). A negative result must be combined with clinical observations, patient history, and epidemiological information. The expected result is Negative.  Fact Sheet for Patients:  bloggercourse.com  Fact Sheet for Healthcare Providers:  seriousbroker.it  This test is no t yet approved or cleared by the [place]  FDA and  has been authorized for detection and/or diagnosis of SARS-CoV-2 by FDA under an Emergency Use Authorization (EUA). This EUA will remain  in effect (meaning this test can be used) for the duration of the COVID-19 declaration under Section 564(b)(1) of the Act, 21 U.S.C.section 360bbb-3(b)(1), unless the authorization is terminated  or revoked sooner.       Influenza A by PCR NEGATIVE NEGATIVE Final   Influenza B by PCR NEGATIVE NEGATIVE Final    Comment: (NOTE) The Xpert Xpress SARS-CoV-2/FLU/RSV plus assay is intended as an aid in the diagnosis of influenza from Nasopharyngeal swab specimens and should not be used as a sole basis for treatment. Nasal washings and aspirates are unacceptable for Xpert Xpress SARS-CoV-2/FLU/RSV testing.  Fact Sheet for Patients: bloggercourse.com  Fact Sheet for Healthcare Providers: seriousbroker.it  This test is not yet approved or cleared by the [place]  FDA and has been authorized for detection and/or diagnosis of SARS-CoV-2 by FDA under an Emergency Use Authorization (EUA). This EUA will remain in effect  (meaning this test can be used) for the duration of the COVID-19 declaration under Section 564(b)(1) of the Act, 21 U.S.C. section 360bbb-3(b)(1), unless  the authorization is terminated or revoked.     Resp Syncytial Virus by PCR NEGATIVE NEGATIVE Final    Comment: (NOTE) Fact Sheet for Patients: bloggercourse.com  Fact Sheet for Healthcare Providers: seriousbroker.it  This test is not yet approved or cleared by the [place]  FDA and has been authorized for detection and/or diagnosis of SARS-CoV-2 by FDA under an Emergency Use Authorization (EUA). This EUA will remain in effect (meaning this test can be used) for the duration of the COVID-19 declaration under Section 564(b)(1) of the Act, 21 U.S.C. section 360bbb-3(b)(1), unless the authorization is terminated or revoked.  Performed at [facility], [address] [place], [place] [address]     Procedures/Studies: DG Chest Port 1 View Result Date: 03/26/2024 CLINICAL DATA:  Pleural effusion. EXAM: PORTABLE CHEST 1 VIEW COMPARISON:  03/24/2024 FINDINGS: The cardio pericardial silhouette is enlarged. Similar retrocardiac left base collapse/consolidation with small effusion. Interval increase in atelectasis or infiltrate at the right base. No acute bony abnormality. IMPRESSION: 1. Interval increase in atelectasis or infiltrate at the right base. 2. Similar retrocardiac left base collapse/consolidation with small effusion. Electronically Signed   By: Camellia Candle M.D.   On: 03/26/2024 07:15   DG Chest 2 View Result Date: 03/24/2024 CLINICAL DATA:  Hypoxia. EXAM: CHEST - 2 VIEW COMPARISON:  03/22/2024 FINDINGS: Moderate left and small right pleural effusions show no significant change. Infiltrate or atelectasis in the left lower lobe is also stable. Pulmonary hyperinflation and mild cardiomegaly again noted. IMPRESSION: No significant change in moderate left and small  right pleural effusions. Stable left lower lobe infiltrate or atelectasis. Electronically Signed   By: Norleen DELENA Kil M.D.   On: 03/24/2024 09:03   ECHOCARDIOGRAM COMPLETE Result Date: 03/23/2024    ECHOCARDIOGRAM REPORT   Patient Name:   [name] Date of Exam: 03/23/2024 Medical Rec #:  [id number]     Height:       63.0 in Accession #:    [id number]    Weight:       170.6 lb Date of Birth:  1929-08-15     BSA:          1.807 m Patient Age:    89 years      BP:           157/71 mmHg Patient Gender: F             HR:           142 bpm. Exam Location:  Inpatient Procedure: Cardiac Doppler, Color Doppler and 2D Echo (Both Spectral and Color            Flow Doppler were utilized during procedure). Indications:    CHF  History:        Patient has no prior history of Echocardiogram examinations.                 Risk Factors:Hypertension.  Sonographer:    Philomena Daring Referring Phys: [id number] VISHAL R PATEL IMPRESSIONS  1. Left ventricular ejection fraction, by estimation, is 35 to 40%. The left ventricle has moderately decreased function. The left ventricle demonstrates global hypokinesis. There is mild concentric left ventricular hypertrophy. Left ventricular diastolic parameters are indeterminate.  2. Right ventricular systolic function is normal. The right ventricular size is normal.  3. Left atrial size was moderately dilated.  4. Moderate pleural effusion.  5. The mitral valve is degenerative. Moderate mitral valve regurgitation. No evidence of mitral stenosis.  6. The aortic valve is  tricuspid. Aortic valve regurgitation is mild to moderate. No aortic stenosis is present.  7. The inferior vena cava is dilated in size with <50% respiratory variability, suggesting right atrial pressure of 15 mmHg. Comparison(s): No prior Echocardiogram. FINDINGS  Left Ventricle: Left ventricular ejection fraction, by estimation, is 35 to 40%. The left ventricle has moderately decreased function. The left ventricle demonstrates global  hypokinesis. The left ventricular internal cavity size was normal in size. There is mild concentric left ventricular hypertrophy. Left ventricular diastolic parameters are indeterminate. Right Ventricle: The right ventricular size is normal. No increase in right ventricular wall thickness. Right ventricular systolic function is normal. Left Atrium: Left atrial size was moderately dilated. Right Atrium: Right atrial size was normal in size. Pericardium: There is no evidence of pericardial effusion. Mitral Valve: The mitral valve is degenerative in appearance. Moderate mitral valve regurgitation. No evidence of mitral valve stenosis. Tricuspid Valve: The tricuspid valve is normal in structure. Tricuspid valve regurgitation is not demonstrated. No evidence of tricuspid stenosis. Aortic Valve: The aortic valve is tricuspid. Aortic valve regurgitation is mild to moderate. Aortic regurgitation PHT measures 252 msec. No aortic stenosis is present. Pulmonic Valve: The pulmonic valve was normal in structure. Pulmonic valve regurgitation is not visualized. No evidence of pulmonic stenosis. Aorta: The aortic root and ascending aorta are structurally normal, with no evidence of dilitation. Venous: The inferior vena cava is dilated in size with less than 50% respiratory variability, suggesting right atrial pressure of 15 mmHg. IAS/Shunts: No atrial level shunt detected by color flow Doppler. Additional Comments: There is a moderate pleural effusion.  LEFT VENTRICLE PLAX 2D LVIDd:         4.80 cm      Diastology LVIDs:         3.60 cm      LV e' medial:    3.59 cm/s LV PW:         1.10 cm      LV E/e' medial:  40.9 LV IVS:        1.10 cm      LV e' lateral:   5.66 cm/s LVOT diam:     1.80 cm      LV E/e' lateral: 26.0 LV SV:         40 LV SV Index:   22 LVOT Area:     2.54 cm  LV Volumes (MOD) LV vol d, MOD A2C: 100.0 ml LV vol d, MOD A4C: 108.0 ml LV vol s, MOD A2C: 52.2 ml LV vol s, MOD A4C: 62.9 ml LV SV MOD A2C:     47.8 ml  LV SV MOD A4C:     108.0 ml LV SV MOD BP:      47.1 ml RIGHT VENTRICLE             IVC RV Basal diam:  3.10 cm     IVC diam: 2.00 cm RV Mid diam:    2.00 cm RV S prime:     10.30 cm/s TAPSE (M-mode): 1.6 cm LEFT ATRIUM             Index        RIGHT ATRIUM           Index LA diam:        4.40 cm 2.43 cm/m   RA Area:     18.90 cm LA Vol (A2C):   75.3 ml 41.66 ml/m  RA Volume:   51.30 ml  28.38 ml/m LA Vol (  A4C):   80.4 ml 44.48 ml/m LA Biplane Vol: 80.7 ml 44.65 ml/m  AORTIC VALVE LVOT Vmax:   87.70 cm/s LVOT Vmean:  58.000 cm/s LVOT VTI:    0.156 m AI PHT:      252 msec  AORTA Ao Root diam: 2.40 cm Ao Asc diam:  2.80 cm MITRAL VALVE                TRICUSPID VALVE MV Area (PHT): 5.23 cm     TR Peak grad:   6.4 mmHg MV Decel Time: 145 msec     TR Vmax:        126.00 cm/s MR Peak grad: 122.8 mmHg MR Mean grad: 81.0 mmHg     SHUNTS MR Vmax:      554.00 cm/s   Systemic VTI:  0.16 m MR Vmean:     431.0 cm/s    Systemic Diam: 1.80 cm MV E velocity: 147.00 cm/s MV A velocity: 130.00 cm/s MV E/A ratio:  1.13 Stanly Leavens MD Electronically signed by Stanly Leavens MD Signature Date/Time: 03/23/2024/12:25:10 PM    Final    DG Chest 2 View Result Date: 03/22/2024 EXAM: 2 VIEW(S) XRAY OF THE CHEST 03/22/2024 04:55:00 PM COMPARISON: 02/25/2024 CLINICAL HISTORY: SOB FINDINGS: LUNGS AND PLEURA: Mild pulmonary edema. Possible left lower lobe consolidation. Moderate left pleural effusion. No pneumothorax. HEART AND MEDIASTINUM: Cardiomegaly. Calcified aorta. BONES AND SOFT TISSUES: No acute osseous abnormality. IMPRESSION: 1. Mild pulmonary edema. 2. Moderate left pleural effusion. 3. Possible left lower lobe consolidation. 4. Cardiomegaly. Electronically signed by: Greig Pique MD 03/22/2024 05:44 PM EST RP Workstation: [id number]    Labs: BNP (last 3 results) Recent Labs    06/10/23 1315  BNP 292.4*   Basic Metabolic Panel: Recent Labs  Lab 03/23/24 0401 03/24/24 0354 03/25/24 0414 03/26/24 0401  03/27/24 0358 03/28/24 0339 03/29/24 0922  NA 144 138 140 139 138 138 139  K 5.0 4.9 4.3 4.3 3.8 4.1 4.2  CL 103 97* 95* 97* 96* 96* 96*  CO2 32 31 39* 35* 33* 34* 32  GLUCOSE 127* 158* 123* 96 107* 147* 112*  BUN 24* 30* 29* 30* 38* 38* 34*  CREATININE 0.92 1.41* 1.00 1.05* 1.04* 1.24* 1.11*  CALCIUM  9.4 9.3 9.4 8.8* 8.7* 8.9 10.1  MG 2.2 2.4  --   --   --   --   --    Liver Function Tests: No results for input(s): AST, ALT, ALKPHOS, BILITOT, PROT, ALBUMIN in the last 168 hours. No results for input(s): LIPASE, AMYLASE in the last 168 hours. No results for input(s): AMMONIA in the last 168 hours. CBC: Recent Labs  Lab 03/23/24 0401 03/24/24 0354 03/25/24 0414 03/26/24 0401 03/27/24 0358  WBC 6.8 9.2 9.9 12.3* 11.9*  NEUTROABS  --  8.0*  --   --   --   HGB 9.7* 9.4* 9.8* 9.7* 10.7*  HCT 31.8* 30.5* 31.1* 29.7* 34.0*  MCV 97.2 96.2 94.0 92.2 91.9  PLT 299 311 327 340 331   CBG: No results for input(s): GLUCAP in the last 168 hours. Hgb A1c No results for input(s): HGBA1C in the last 72 hours. Anemia work up No results for input(s): VITAMINB12, FOLATE, FERRITIN, TIBC, IRON, RETICCTPCT in the last 72 hours. Cardiac Enzymes: No results for input(s): CKTOTAL, CKMB, CKMBINDEX, TROPONINI in the last 168 hours. BNP: Invalid input(s): POCBNP D-Dimer No results for input(s): DDIMER in the last 72 hours. Lipid Profile No results for input(s): CHOL, HDL, LDLCALC, TRIG,  CHOLHDL, LDLDIRECT in the last 72 hours. Thyroid  function studies No results for input(s): TSH, T4TOTAL, T3FREE, THYROIDAB in the last 72 hours.  Invalid input(s): FREET3 Urinalysis    Component Value Date/Time   COLORURINE YELLOW 02/25/2024 1111   APPEARANCEUR HAZY (A) 02/25/2024 1111   LABSPEC 1.015 02/25/2024 1111   PHURINE 6.0 02/25/2024 1111   GLUCOSEU NEGATIVE 02/25/2024 1111   GLUCOSEU NEGATIVE 01/11/2008 1526   HGBUR SMALL (A)  02/25/2024 1111   BILIRUBINUR NEGATIVE 02/25/2024 1111   BILIRUBINUR neg 08/29/2021 1301   KETONESUR 5 (A) 02/25/2024 1111   PROTEINUR 100 (A) 02/25/2024 1111   UROBILINOGEN 1.0 08/29/2021 1301   UROBILINOGEN 0.2 mg/dL 89/73/7990 8473   NITRITE NEGATIVE 02/25/2024 1111   LEUKOCYTESUR SMALL (A) 02/25/2024 1111   Sepsis Labs Recent Labs  Lab 03/24/24 0354 03/25/24 0414 03/26/24 0401 03/27/24 0358  WBC 9.2 9.9 12.3* 11.9*   Microbiology Recent Results (from the past 240 hours)  Resp panel by RT-PCR (RSV, Flu A&B, Covid) Anterior Nasal Swab     Status: None   Collection Time: 03/22/24  4:09 PM   Specimen: Anterior Nasal Swab  Result Value Ref Range Status   SARS Coronavirus 2 by RT PCR NEGATIVE NEGATIVE Final    Comment: (NOTE) SARS-CoV-2 target nucleic acids are NOT DETECTED.  The SARS-CoV-2 RNA is generally detectable in upper respiratory specimens during the acute phase of infection. The lowest concentration of SARS-CoV-2 viral copies this assay can detect is 138 copies/mL. A negative result does not preclude SARS-Cov-2 infection and should not be used as the sole basis for treatment or other patient management decisions. A negative result may occur with  improper specimen collection/handling, submission of specimen other than nasopharyngeal swab, presence of viral mutation(s) within the areas targeted by this assay, and inadequate number of viral copies(<138 copies/mL). A negative result must be combined with clinical observations, patient history, and epidemiological information. The expected result is Negative.  Fact Sheet for Patients:  bloggercourse.com  Fact Sheet for Healthcare Providers:  seriousbroker.it  This test is no t yet approved or cleared by the [place]  FDA and  has been authorized for detection and/or diagnosis of SARS-CoV-2 by FDA under an Emergency Use Authorization (EUA). This EUA will remain   in effect (meaning this test can be used) for the duration of the COVID-19 declaration under Section 564(b)(1) of the Act, 21 U.S.C.section 360bbb-3(b)(1), unless the authorization is terminated  or revoked sooner.       Influenza A by PCR NEGATIVE NEGATIVE Final   Influenza B by PCR NEGATIVE NEGATIVE Final    Comment: (NOTE) The Xpert Xpress SARS-CoV-2/FLU/RSV plus assay is intended as an aid in the diagnosis of influenza from Nasopharyngeal swab specimens and should not be used as a sole basis for treatment. Nasal washings and aspirates are unacceptable for Xpert Xpress SARS-CoV-2/FLU/RSV testing.  Fact Sheet for Patients: bloggercourse.com  Fact Sheet for Healthcare Providers: seriousbroker.it  This test is not yet approved or cleared by the [place]  FDA and has been authorized for detection and/or diagnosis of SARS-CoV-2 by FDA under an Emergency Use Authorization (EUA). This EUA will remain in effect (meaning this test can be used) for the duration of the COVID-19 declaration under Section 564(b)(1) of the Act, 21 U.S.C. section 360bbb-3(b)(1), unless the authorization is terminated or revoked.     Resp Syncytial Virus by PCR NEGATIVE NEGATIVE Final    Comment: (NOTE) Fact Sheet for Patients: bloggercourse.com  Fact Sheet for Healthcare Providers: seriousbroker.it  This test is not yet approved or cleared by the [place]  FDA and has been authorized for detection and/or diagnosis of SARS-CoV-2 by FDA under an Emergency Use Authorization (EUA). This EUA will remain in effect (meaning this test can be used) for the duration of the COVID-19 declaration under Section 564(b)(1) of the Act, 21 U.S.C. section 360bbb-3(b)(1), unless the authorization is terminated or revoked.  Performed at [facility], [address] [place], [place]  [address]      Time coordinating discharge: 35 minutes  SIGNED: Mennie LAMY, MD  [facility] 03/29/2024, 10:42 AM  If 7PM-7AM, please contact night-coverage [email or web address]       [1]  Allergies Allergen Reactions   Oxycodone  Itching and Other (See Comments)    Hallucinations   Statins Other (See Comments)    Myalgia    Advair Diskus [Fluticasone -Salmeterol] Other (See Comments)    Mouth sores   Atorvastatin  Other (See Comments)    Severe headache   Sulfonamide Derivatives Rash

## 2024-03-29 NOTE — Progress Notes (Signed)
 Mobility Specialist - Progress Note   03/29/24 0814  Mobility  Activity Pivoted/transferred from bed to chair  Level of Assistance Minimal assist, patient does 75% or more  Assistive Device Front wheel walker  Distance Ambulated (ft) 3 ft  Range of Motion/Exercises Active  Activity Response Tolerated well  Mobility visit 1 Mobility  Mobility Specialist Start Time (ACUTE ONLY) 0805  Mobility Specialist Stop Time (ACUTE ONLY) [id number]  Mobility Specialist Time Calculation (min) (ACUTE ONLY) 9 min   Pt was found in bed and eager to mobilize. No complaints. At EOS was left in bed with all needs met. Call bell in reach and NT in room.   Erminio Leos,  Mobility Specialist Can be reached via Secure Chat

## 2024-03-29 NOTE — TOC Transition Note (Signed)
 Transition of Care [facility]) - Discharge Note   Patient Details  Name: [name] MRN: [id number] Date of Birth: 08/05/1929  [facility] CM/SW Contact:  Tawni CHRISTELLA Eva, LCSW Phone Number: 03/29/2024, 11:13 AM   Clinical Narrative:     Pt to d/c home, CSW confirmed with pt's RN; pt has O2 in car. No further ICM needs, ICM sign off.   Final next level of care: Home/Self Care Barriers to Discharge: Barriers Resolved   Patient Goals and CMS Choice Patient states their goals for this hospitalization and ongoing recovery are:: retrun home          Discharge Placement                    Patient and family notified of of transfer: 03/29/24  Discharge Plan and Services Additional resources added to the After Visit Summary for   In-house Referral: Clinical Social Work              DME Arranged: N/A DME Agency: NA       HH Arranged: PT, OT, RN HH Agency: [facility] Date HH Agency Contacted: 03/24/24 Time HH Agency Contacted: 1132 Representative spoke with at [facility] Agency: [place]   Social Drivers of Health (SDOH) Interventions SDOH Screenings   Food Insecurity: No Food Insecurity (03/23/2024)  Housing: Low Risk (03/23/2024)  Transportation Needs: No Transportation Needs (03/23/2024)  Utilities: Not At Risk (03/23/2024)  Alcohol  Screen: Low Risk (08/26/2023)  Depression (PHQ2-9): Medium Risk (09/30/2023)  Financial Resource Strain: Low Risk (08/26/2023)  Physical Activity: Insufficiently Active (08/26/2023)  Social Connections: Socially Isolated (03/23/2024)  Stress: No Stress Concern Present (08/26/2023)  Tobacco Use: Low Risk (03/23/2024)  Health Literacy: Adequate Health Literacy (08/26/2023)     Readmission Risk Interventions    03/24/2024   11:14 AM 02/28/2024   11:11 AM  Readmission Risk Prevention Plan  Transportation Screening Complete Complete  HRI or Home Care Consult  Complete  Social Work Consult for Recovery Care  Planning/Counseling  Complete  Palliative Care Screening  Not Applicable  Medication Review Oceanographer) Complete Complete  HRI or Home Care Consult Complete   SW Recovery Care/Counseling Consult Complete   Palliative Care Screening Not Applicable   Skilled Nursing Facility Not Applicable

## 2024-03-29 NOTE — Progress Notes (Signed)
 Discharge instructions reviewed with patient and patient's Daughter Donzell. Both verbalized understanding. All questions answered. All belongings accounted for. Patient to follow up with MD in  1-2 weeks. PIV removed.

## 2024-03-30 ENCOUNTER — Telehealth: Payer: Self-pay

## 2024-03-30 NOTE — Transitions of Care (Post Inpatient/ED Visit) (Signed)
 "  03/30/2024  Name: [name] MRN: [id number] DOB: 11-11-1929  Today's TOC FU Call Status: Today's TOC FU Call Status:: Successful TOC FU Call Completed TOC FU Call Complete Date: 03/30/24  Patient's Name and Date of Birth confirmed. Name, DOB  Transition Care Management Follow-up Telephone Call Date of Discharge: 03/29/24 Discharge Facility: [facility]) Type of Discharge: Inpatient Admission Primary Inpatient Discharge Diagnosis:: Acute on chronic respiratory failure with hypoxia How have you been since you were released from the hospital?: Better (sleeping alot) Any questions or concerns?: No  Items Reviewed: Did you receive and understand the discharge instructions provided?: Yes Medications obtained,verified, and reconciled?: Yes (Medications Reviewed) Any new allergies since your discharge?: No Dietary orders reviewed?: Yes Type of Diet Ordered:: low sodium heart healthy diet. Do you have support at home?: Yes People in Home [RPT]: child(ren), adult Name of Support/Comfort Primary Source: daughter  Medications Reviewed Today: Medications Reviewed Today     Reviewed by Rumalda Alan PENNER, RN (Registered Nurse) on 03/30/24 at 1235  Med List Status: <None>   Medication Order Taking? Sig Documenting Provider Last Dose Status Informant  acetaminophen  (TYLENOL ) 500 MG tablet [id number] Yes Take 1 tablet (500 mg total) by mouth 3 (three) times daily. Regalado, Belkys A, MD  Active Child, Pharmacy Records  albuterol  (PROVENTIL ) (2.5 MG/3ML) 0.083% nebulizer solution [id number] Yes Take 3 mLs (2.5 mg total) by nebulization every 6 (six) hours as needed for wheezing or shortness of breath.  Patient taking differently: Take 2.5 mg by nebulization every 6 (six) hours as needed for wheezing or shortness of breath. Takes twice a day.   Wendolyn Jenkins Jansky, MD  Active Child, Pharmacy Records  ARIPiprazole  (ABILIFY ) 2 MG tablet [id number] Yes TAKE 1 TABLET BY MOUTH EVERY DAY *NEW  PRESCRIPTION REQUEST* Wendolyn Jenkins Jansky, MD  Active Child, Pharmacy Records  ASPIRIN  LOW DOSE 81 MG tablet [id number] Yes TAKE 1 TABLET BY MOUTH EVERY DAY *NEW PRESCRIPTION REQUEST* Wendolyn Jenkins Jansky, MD  Active Child, Pharmacy Records  budesonide  (PULMICORT ) 1 MG/2ML nebulizer solution [id number] Yes INHALE THE CONTENTS OF 1 VIAL VIA NEBULIZER TWICE DAILY *NEW PRESCRIPTION REQUESTDEWAINE Wendolyn Jenkins Jansky, MD  Active Child, Pharmacy Records  buPROPion  (WELLBUTRIN  XL) 150 MG 24 hr tablet [id number] Yes Take 1 tablet (150 mg total) by mouth in the morning. Wendolyn Jenkins Jansky, MD  Active Child, Pharmacy Records  calcium -vitamin D  [facility] WITH D) 500-200 MG-UNIT tablet [id number] Yes Take 1 tablet by mouth 3 (three) times daily.  Patient taking differently: Take 1 tablet by mouth 3 (three) times daily. Takes daily   Jerri Kay HERO, MD  Active Child, Pharmacy Records  carvedilol  (COREG ) 6.25 MG tablet [id number] Yes Take 1 tablet (6.25 mg total) by mouth 2 (two) times daily with a meal. Kc, Ramesh, MD  Active   cephALEXin  (KEFLEX ) 250 MG capsule [id number] Yes Take 1 capsule (250 mg total) by mouth daily. Resume after finished with antibiotics from hospitalization Tat, Alm, MD  Active Child, Pharmacy Records           Med Note (CRUTHIS, SHEFFIELD BROCKS   Tue Mar 23, 2024  1:45 PM) Continuous course   Cholecalciferol  (VITAMIN D ) 50 MCG (2000 UT) tablet [id number] Yes Take 2,000 Units by mouth daily. [provider]  Active Child, Pharmacy Records  cyanocobalamin  (VITAMIN B12) 1000 MCG/ML injection [id number] Yes Inject 1 mL (1,000 mcg total) into the muscle every 30 (thirty) days. Wendolyn Jenkins Jansky, MD  Active Child, Pharmacy Records  diazepam  (VALIUM ) 5  MG tablet [id number] Yes Take 1 tablet (5 mg total) by mouth See admin instructions. Take 5 mg by mouth in the evening AS NEEDED for restlessness and an additional 5 mg once a day as needed for anxiety Wendolyn Jenkins Jansky, MD  Active Child, Pharmacy Records  docusate sodium   (COLACE) 100 MG capsule [id number] Yes Take 1 capsule (100 mg total) by mouth daily. Wendolyn Jenkins Jansky, MD  Active Child, Pharmacy Records  estradiol (ESTRACE) 0.1 MG/GM vaginal cream [id number]  Place 1 Applicatorful vaginally daily as needed (burning).  Patient not taking: Reported on 03/30/2024   [provider]  Active Child, Pharmacy Records  ferrous sulfate  325 (65 FE) MG tablet [id number] Yes Take 1 tablet (325 mg total) by mouth daily with breakfast. Christobal Guadalajara, MD  Active   folic acid  (FOLVITE ) 1 MG tablet [id number] Yes Take 1 tablet (1 mg total) by mouth daily. Wendolyn Jenkins Jansky, MD  Active Child, Pharmacy Records  guaiFENesin  (MUCINEX ) 600 MG 12 hr tablet [id number] Yes Take 600 mg by mouth 2 (two) times daily. [provider]  Active Child, Pharmacy Records  HYDROcodone -acetaminophen  (NORCO/VICODIN) 5-325 MG tablet [id number] Yes Take 1 tablet by mouth every 6 (six) hours as needed for moderate pain (pain score 4-6). Wendolyn Jenkins Jansky, MD  Active Child, Pharmacy Records  memantine  (NAMENDA ) 5 MG tablet [id number] Yes Take 1 tablet (5 mg total) by mouth in the morning and at bedtime. Wendolyn Jenkins Jansky, MD  Active Child, Pharmacy Records  omega-3 acid ethyl esters (LOVAZA ) 1 g capsule [id number] Yes Take 2 capsules (2 g total) by mouth 2 (two) times daily. Wendolyn Jenkins Jansky, MD  Active Child, Pharmacy Records  omeprazole  [facility]) 40 MG capsule [id number] Yes TAKE ONE (1) CAPSULE BY MOUTH TWICE DAILY *NEW PRESCRIPTION REQUEST* Wendolyn Jenkins Jansky, MD  Active Child, Pharmacy Records  ondansetron  (ZOFRAN -ODT) 8 MG disintegrating tablet [id number] Yes Take 1 tablet (8 mg total) by mouth every 8 (eight) hours as needed for nausea or vomiting. PLACE 1 TABLET ON TONGUE EVERY 8 HOURS AS NEEDED FOR NAUSEA OR VOMITING Wendolyn Jenkins Jansky, MD  Active Child, Pharmacy Records  polyethylene glycol (MIRALAX  / GLYCOLAX ) 17 g packet [id number] Yes Take 17 g by mouth daily. Wendolyn Jenkins Jansky, MD  Active Child,  Pharmacy Records  predniSONE  (DELTASONE ) 10 MG tablet [id number] Yes Take 3 tabs daily x 2 days, then 2 tabs daily x 2 days,then 1 tab daily x 2 days then resume home the dose Christobal Guadalajara, MD  Active   predniSONE  (DELTASONE ) 5 MG tablet [id number]  Take 1 tablet (5 mg total) by mouth at bedtime. Resume after finished with prednisone  taper from hospitalization  Patient not taking: Reported on 03/30/2024   Evonnie Lenis, MD  Active Child, Pharmacy Records           Med Note (CRUTHIS, SHEFFIELD BROCKS   Tue Mar 23, 2024  1:46 PM) Continuous course.   Probiotic CHEW [id number] Yes Chew 1 each by mouth every other day. Wendolyn Jenkins Jansky, MD  Active Child, Pharmacy Records  rOPINIRole  (REQUIP ) 1 MG tablet [id number] Yes Take 1 tablet (1 mg total) by mouth as directed. TAKE 1 TABLET IN THE MORNING AND 2 TABLETS IN THE EVENING Wendolyn Jenkins Jansky, MD  Active Child, Pharmacy Records  sacubitril -valsartan  (ENTRESTO ) 24-26 MG [id number] Yes Take 1 tablet by mouth 2 (two) times daily. Christobal Guadalajara, MD  Active   sertraline  (ZOLOFT ) 50 MG tablet [id number] Yes Take 1 tablet (50 mg total) by  mouth in the morning. Wendolyn Jenkins Jansky, MD  Active Child, Pharmacy Records  spironolactone  (ALDACTONE ) 25 MG tablet [id number] Yes Take 0.5 tablets (12.5 mg total) by mouth daily. Christobal Guadalajara, MD  Active   SYSTANE 0.4-0.3 % SOLN [id number] Yes Place 1 drop into both eyes 4 (four) times daily as needed (for dryness). Wendolyn Jenkins Jansky, MD  Active Child, Pharmacy Records  Tiotropium Bromide  (SPIRIVA  RESPIMAT) 2.5 MCG/ACT AERS [id number] Yes Inhale 2 Inhalations into the lungs daily at 12 noon. Wendolyn Jenkins Jansky, MD  Active Child, Pharmacy Records  tocilizumab  4 mg/kg in sodium chloride  0.9 % [id number]  Inject 280 mg into the vein every 28 (twenty-eight) days.  Patient not taking: Reported on 03/30/2024   [provider]  Active Child, Pharmacy Records           Med Note (CRUTHIS, SHEFFIELD BROCKS   Tue Mar 23, 2024  1:41 PM) Last dosed in November. Has not had  it recently due to being on antibiotics.   Med List Note (Cruthis, Chloe, CPhT 03/23/24 1345): Donzell (daughter) handles medications.             Home Care and Equipment/Supplies: Were Home Health Services Ordered?: Yes Name of Home Health Agency:: [facility] Has Agency set up a time to come to your home?: No Any new equipment or medical supplies ordered?: No  Functional Questionnaire: Do you need assistance with bathing/showering or dressing?: Yes (family) Do you need assistance with meal preparation?: Yes (family) Do you need assistance with eating?: No Do you have difficulty maintaining continence: Yes (diaper) Do you need assistance with getting out of bed/getting out of a chair/moving?: Yes (family) Do you have difficulty managing or taking your medications?: Yes (daughter)  Follow up appointments reviewed: PCP Follow-up appointment confirmed?: No MD Provider Line Number:[phone] Given: No Specialist Hospital Follow-up appointment confirmed?: Yes Date of Specialist follow-up appointment?: 04/06/24 Follow-Up Specialty Provider:: ortho and cardiology Do you need transportation to your follow-up appointment?: No Do you understand care options if your condition(s) worsen?: Yes-patient verbalized understanding  SDOH Interventions Today    Flowsheet Row Most Recent Value  SDOH Interventions   Food Insecurity Interventions Intervention Not Indicated  Housing Interventions Intervention Not Indicated  Transportation Interventions Intervention Not Indicated  Utilities Interventions Intervention Not Indicated   Placed call to patient and spoke with daughter.  Daughter reports that patient is very tired and sleepy.  Currently patient is at the daughters house.  Patient will be going back to her house on Thursday.  Reviewed discharge instructions with daughter. Patient has all medications and daughter is in charge of medications.  Daughter denies any concerns about medications.    Reviewed with daughter ambulation and patient is having more weakness and more difficulty standing. Patient was active with home health PT prior to admission.  Home health has not contacted daughter yet but daughter states she will call if she does not hear from then in the next 24 hours.  Daughter wants patient seen at the patients house which will not happen for 2 more days.   Reviewed skin concerns with patient being incontinent of urine at this time. Reviewed importance of turning and repositioning. Reviewed importance keeping patient clean and dry as much as possible.  Reviewed importance of timely follow up with PCP.    Reviewed and offerex 30 day TOC program and daughter declines. Provided my contact information for daughter to call me back if needed.  Daughter declined me calling home health at this  time.    Alan Ee, RN, BSN, CEN Population Health- Transition of Care Team.  [facility] [phone]  "

## 2024-04-05 ENCOUNTER — Other Ambulatory Visit: Payer: Self-pay

## 2024-04-05 ENCOUNTER — Encounter: Payer: Self-pay | Admitting: Family Medicine

## 2024-04-05 ENCOUNTER — Encounter (HOSPITAL_COMMUNITY): Payer: Self-pay

## 2024-04-05 ENCOUNTER — Emergency Department (HOSPITAL_COMMUNITY)

## 2024-04-05 ENCOUNTER — Ambulatory Visit: Admitting: Family Medicine

## 2024-04-05 ENCOUNTER — Inpatient Hospital Stay (HOSPITAL_COMMUNITY)
Admission: EM | Admit: 2024-04-05 | Discharge: 2024-04-09 | DRG: 193 | Disposition: A | Discharge status: Home / Self Care | Source: Ambulatory Visit | Attending: Internal Medicine | Admitting: Internal Medicine

## 2024-04-05 VITALS — BP 120/84 | HR 56 | Temp 97.0°F | Resp 30 | Ht 63.0 in | Wt 163.5 lb

## 2024-04-05 DIAGNOSIS — F0393 Unspecified dementia, unspecified severity, with mood disturbance: Secondary | ICD-10-CM | POA: Diagnosis present

## 2024-04-05 DIAGNOSIS — Z66 Do not resuscitate: Secondary | ICD-10-CM | POA: Diagnosis present

## 2024-04-05 DIAGNOSIS — Z8261 Family history of arthritis: Secondary | ICD-10-CM

## 2024-04-05 DIAGNOSIS — Z860101 Personal history of adenomatous and serrated colon polyps: Secondary | ICD-10-CM

## 2024-04-05 DIAGNOSIS — Z8701 Personal history of pneumonia (recurrent): Secondary | ICD-10-CM

## 2024-04-05 DIAGNOSIS — Z79818 Long term (current) use of other agents affecting estrogen receptors and estrogen levels: Secondary | ICD-10-CM

## 2024-04-05 DIAGNOSIS — Z85828 Personal history of other malignant neoplasm of skin: Secondary | ICD-10-CM

## 2024-04-05 DIAGNOSIS — G2581 Restless legs syndrome: Secondary | ICD-10-CM | POA: Diagnosis present

## 2024-04-05 DIAGNOSIS — M810 Age-related osteoporosis without current pathological fracture: Secondary | ICD-10-CM | POA: Diagnosis present

## 2024-04-05 DIAGNOSIS — N1831 Chronic kidney disease, stage 3a: Secondary | ICD-10-CM | POA: Diagnosis present

## 2024-04-05 DIAGNOSIS — J9611 Chronic respiratory failure with hypoxia: Secondary | ICD-10-CM | POA: Diagnosis present

## 2024-04-05 DIAGNOSIS — I509 Heart failure, unspecified: Secondary | ICD-10-CM

## 2024-04-05 DIAGNOSIS — Z7982 Long term (current) use of aspirin: Secondary | ICD-10-CM | POA: Diagnosis not present

## 2024-04-05 DIAGNOSIS — J810 Acute pulmonary edema: Secondary | ICD-10-CM

## 2024-04-05 DIAGNOSIS — M797 Fibromyalgia: Secondary | ICD-10-CM | POA: Diagnosis present

## 2024-04-05 DIAGNOSIS — D509 Iron deficiency anemia, unspecified: Secondary | ICD-10-CM | POA: Diagnosis present

## 2024-04-05 DIAGNOSIS — I130 Hypertensive heart and chronic kidney disease with heart failure and stage 1 through stage 4 chronic kidney disease, or unspecified chronic kidney disease: Secondary | ICD-10-CM | POA: Diagnosis present

## 2024-04-05 DIAGNOSIS — F0394 Unspecified dementia, unspecified severity, with anxiety: Secondary | ICD-10-CM | POA: Diagnosis present

## 2024-04-05 DIAGNOSIS — M315 Giant cell arteritis with polymyalgia rheumatica: Secondary | ICD-10-CM | POA: Diagnosis present

## 2024-04-05 DIAGNOSIS — I5021 Acute systolic (congestive) heart failure: Secondary | ICD-10-CM | POA: Diagnosis not present

## 2024-04-05 DIAGNOSIS — Y95 Nosocomial condition: Secondary | ICD-10-CM | POA: Diagnosis present

## 2024-04-05 DIAGNOSIS — K760 Fatty (change of) liver, not elsewhere classified: Secondary | ICD-10-CM | POA: Diagnosis present

## 2024-04-05 DIAGNOSIS — E785 Hyperlipidemia, unspecified: Secondary | ICD-10-CM | POA: Diagnosis present

## 2024-04-05 DIAGNOSIS — Z888 Allergy status to other drugs, medicaments and biological substances status: Secondary | ICD-10-CM

## 2024-04-05 DIAGNOSIS — Z885 Allergy status to narcotic agent status: Secondary | ICD-10-CM

## 2024-04-05 DIAGNOSIS — Z8249 Family history of ischemic heart disease and other diseases of the circulatory system: Secondary | ICD-10-CM | POA: Diagnosis not present

## 2024-04-05 DIAGNOSIS — Z801 Family history of malignant neoplasm of trachea, bronchus and lung: Secondary | ICD-10-CM

## 2024-04-05 DIAGNOSIS — Z833 Family history of diabetes mellitus: Secondary | ICD-10-CM

## 2024-04-05 DIAGNOSIS — E890 Postprocedural hypothyroidism: Secondary | ICD-10-CM | POA: Diagnosis present

## 2024-04-05 DIAGNOSIS — R160 Hepatomegaly, not elsewhere classified: Secondary | ICD-10-CM | POA: Diagnosis present

## 2024-04-05 DIAGNOSIS — J45909 Unspecified asthma, uncomplicated: Secondary | ICD-10-CM | POA: Diagnosis present

## 2024-04-05 DIAGNOSIS — F32A Depression, unspecified: Secondary | ICD-10-CM | POA: Diagnosis present

## 2024-04-05 DIAGNOSIS — I5023 Acute on chronic systolic (congestive) heart failure: Secondary | ICD-10-CM | POA: Diagnosis present

## 2024-04-05 DIAGNOSIS — Z7952 Long term (current) use of systemic steroids: Secondary | ICD-10-CM

## 2024-04-05 DIAGNOSIS — Z7951 Long term (current) use of inhaled steroids: Secondary | ICD-10-CM | POA: Diagnosis not present

## 2024-04-05 DIAGNOSIS — Z823 Family history of stroke: Secondary | ICD-10-CM

## 2024-04-05 DIAGNOSIS — M316 Other giant cell arteritis: Secondary | ICD-10-CM | POA: Diagnosis present

## 2024-04-05 DIAGNOSIS — J189 Pneumonia, unspecified organism: Principal | ICD-10-CM | POA: Diagnosis present

## 2024-04-05 DIAGNOSIS — M353 Polymyalgia rheumatica: Secondary | ICD-10-CM | POA: Diagnosis present

## 2024-04-05 DIAGNOSIS — Z79899 Other long term (current) drug therapy: Secondary | ICD-10-CM

## 2024-04-05 DIAGNOSIS — Z83719 Family history of colon polyps, unspecified: Secondary | ICD-10-CM

## 2024-04-05 DIAGNOSIS — Z825 Family history of asthma and other chronic lower respiratory diseases: Secondary | ICD-10-CM

## 2024-04-05 DIAGNOSIS — Z79620 Long term (current) use of immunosuppressive biologic: Secondary | ICD-10-CM

## 2024-04-05 DIAGNOSIS — Z882 Allergy status to sulfonamides status: Secondary | ICD-10-CM

## 2024-04-05 LAB — CBC WITH DIFFERENTIAL/PLATELET
Abs Immature Granulocytes: 0.08 K/uL — ABNORMAL HIGH (ref 0.00–0.07)
Basophils Absolute: 0 K/uL (ref 0.0–0.1)
Basophils Relative: 0 %
Eosinophils Absolute: 0.1 K/uL (ref 0.0–0.5)
Eosinophils Relative: 0 %
HCT: 34.7 % — ABNORMAL LOW (ref 36.0–46.0)
Hemoglobin: 10.3 g/dL — ABNORMAL LOW (ref 12.0–15.0)
Immature Granulocytes: 1 %
Lymphocytes Relative: 10 %
Lymphs Abs: 1.4 K/uL (ref 0.7–4.0)
MCH: 28.8 pg (ref 26.0–34.0)
MCHC: 29.7 g/dL — ABNORMAL LOW (ref 30.0–36.0)
MCV: 96.9 fL (ref 80.0–100.0)
Monocytes Absolute: 0.9 K/uL (ref 0.1–1.0)
Monocytes Relative: 6 %
Neutro Abs: 12 K/uL — ABNORMAL HIGH (ref 1.7–7.7)
Neutrophils Relative %: 83 %
Platelets: 262 K/uL (ref 150–400)
RBC: 3.58 MIL/uL — ABNORMAL LOW (ref 3.87–5.11)
RDW: 15.4 % (ref 11.5–15.5)
WBC: 14.5 K/uL — ABNORMAL HIGH (ref 4.0–10.5)
nRBC: 0 % (ref 0.0–0.2)

## 2024-04-05 LAB — MRSA NEXT GEN BY PCR, NASAL: MRSA by PCR Next Gen: NOT DETECTED

## 2024-04-05 LAB — BASIC METABOLIC PANEL WITH GFR
Anion gap: 8 (ref 5–15)
BUN: 27 mg/dL — ABNORMAL HIGH (ref 8–23)
CO2: 31 mmol/L (ref 22–32)
Calcium: 9.2 mg/dL (ref 8.9–10.3)
Chloride: 102 mmol/L (ref 98–111)
Creatinine, Ser: 0.92 mg/dL (ref 0.44–1.00)
GFR, Estimated: 58 mL/min — ABNORMAL LOW
Glucose, Bld: 143 mg/dL — ABNORMAL HIGH (ref 70–99)
Potassium: 5.3 mmol/L — ABNORMAL HIGH (ref 3.5–5.1)
Sodium: 141 mmol/L (ref 135–145)

## 2024-04-05 LAB — I-STAT CG4 LACTIC ACID, ED: Lactic Acid, Venous: 0.7 mmol/L (ref 0.5–1.9)

## 2024-04-05 LAB — PRO BRAIN NATRIURETIC PEPTIDE: Pro Brain Natriuretic Peptide: >35000 pg/mL — ABNORMAL HIGH

## 2024-04-05 MED ORDER — SODIUM CHLORIDE 0.9 % IV SOLN
2.0000 g | Freq: Two times a day (BID) | INTRAVENOUS | Status: DC
  Administered 2024-04-06 – 2024-04-09 (×7): 2 g via INTRAVENOUS
  Filled 2024-04-05 (×7): qty 12.5

## 2024-04-05 MED ORDER — TIOTROPIUM BROMIDE 2.5 MCG/ACT IN AERS: 2.0000 | INHALATION_SPRAY | Freq: Every day | RESPIRATORY_TRACT | Status: DC

## 2024-04-05 MED ORDER — ROPINIROLE HCL 1 MG PO TABS
1.0000 mg | ORAL_TABLET | Freq: Every day | ORAL | Status: DC
  Administered 2024-04-06 – 2024-04-09 (×4): 1 mg via ORAL
  Filled 2024-04-05 (×4): qty 1

## 2024-04-05 MED ORDER — SACUBITRIL-VALSARTAN 24-26 MG PO TABS
1.0000 | ORAL_TABLET | Freq: Two times a day (BID) | ORAL | Status: DC
  Administered 2024-04-05 – 2024-04-09 (×8): 1 via ORAL
  Filled 2024-04-05 (×9): qty 1

## 2024-04-05 MED ORDER — ARIPIPRAZOLE 2 MG PO TABS
2.0000 mg | ORAL_TABLET | Freq: Every day | ORAL | Status: DC
  Administered 2024-04-06 – 2024-04-09 (×4): 2 mg via ORAL
  Filled 2024-04-05 (×4): qty 1

## 2024-04-05 MED ORDER — POLYETHYL GLYCOL-PROPYL GLYCOL 0.4-0.3 % OP SOLN: 1.0000 [drp] | Freq: Four times a day (QID) | OPHTHALMIC | Status: DC | PRN

## 2024-04-05 MED ORDER — VANCOMYCIN HCL IN DEXTROSE 1-5 GM/200ML-% IV SOLN: 1000.0000 mg | Freq: Once | INTRAVENOUS | Status: DC

## 2024-04-05 MED ORDER — SODIUM CHLORIDE 0.9 % IV SOLN
2.0000 g | Freq: Once | INTRAVENOUS | Status: AC
  Administered 2024-04-05: 2 g via INTRAVENOUS
  Filled 2024-04-05: qty 12.5

## 2024-04-05 MED ORDER — PANTOPRAZOLE SODIUM 40 MG PO TBEC
40.0000 mg | DELAYED_RELEASE_TABLET | Freq: Every day | ORAL | Status: DC
  Administered 2024-04-06 – 2024-04-09 (×3): 40 mg via ORAL
  Filled 2024-04-05 (×3): qty 1

## 2024-04-05 MED ORDER — ONDANSETRON HCL 4 MG PO TABS: 4.0000 mg | ORAL_TABLET | Freq: Four times a day (QID) | ORAL | Status: DC | PRN

## 2024-04-05 MED ORDER — VANCOMYCIN HCL 1500 MG/300ML IV SOLN
1500.0000 mg | Freq: Once | INTRAVENOUS | Status: AC
  Administered 2024-04-05: 1500 mg via INTRAVENOUS
  Filled 2024-04-05: qty 300

## 2024-04-05 MED ORDER — BUDESONIDE 0.5 MG/2ML IN SUSP
1.0000 mg | Freq: Two times a day (BID) | RESPIRATORY_TRACT | Status: DC
  Administered 2024-04-05 – 2024-04-09 (×6): 1 mg via RESPIRATORY_TRACT
  Filled 2024-04-05 (×9): qty 4

## 2024-04-05 MED ORDER — ACETAMINOPHEN 325 MG PO TABS
650.0000 mg | ORAL_TABLET | Freq: Four times a day (QID) | ORAL | Status: DC | PRN
  Administered 2024-04-07: 650 mg via ORAL
  Filled 2024-04-05: qty 2

## 2024-04-05 MED ORDER — ALBUTEROL SULFATE (2.5 MG/3ML) 0.083% IN NEBU
2.5000 mg | INHALATION_SOLUTION | RESPIRATORY_TRACT | Status: DC | PRN
  Administered 2024-04-08: 2.5 mg via RESPIRATORY_TRACT
  Filled 2024-04-05: qty 3

## 2024-04-05 MED ORDER — PREDNISONE 5 MG PO TABS
5.0000 mg | ORAL_TABLET | Freq: Every day | ORAL | Status: DC
  Administered 2024-04-06 – 2024-04-09 (×3): 5 mg via ORAL
  Filled 2024-04-05 (×4): qty 1

## 2024-04-05 MED ORDER — ACETAMINOPHEN 650 MG RE SUPP: 650.0000 mg | Freq: Four times a day (QID) | RECTAL | Status: DC | PRN

## 2024-04-05 MED ORDER — SPIRONOLACTONE 12.5 MG HALF TABLET
12.5000 mg | ORAL_TABLET | Freq: Every day | ORAL | Status: DC
  Administered 2024-04-06 – 2024-04-09 (×4): 12.5 mg via ORAL
  Filled 2024-04-05 (×4): qty 1

## 2024-04-05 MED ORDER — UMECLIDINIUM BROMIDE 62.5 MCG/ACT IN AEPB
1.0000 | INHALATION_SPRAY | Freq: Every day | RESPIRATORY_TRACT | Status: DC
  Administered 2024-04-06 – 2024-04-09 (×3): 1 via RESPIRATORY_TRACT
  Filled 2024-04-05: qty 7

## 2024-04-05 MED ORDER — POLYVINYL ALCOHOL 1.4 % OP SOLN: 1.0000 [drp] | Freq: Four times a day (QID) | OPHTHALMIC | Status: DC | PRN

## 2024-04-05 MED ORDER — ALBUTEROL SULFATE (2.5 MG/3ML) 0.083% IN NEBU: 2.5000 mg | INHALATION_SOLUTION | Freq: Four times a day (QID) | RESPIRATORY_TRACT | 3 refills | Status: DC | PRN

## 2024-04-05 MED ORDER — SERTRALINE HCL 50 MG PO TABS
50.0000 mg | ORAL_TABLET | Freq: Every day | ORAL | Status: DC
  Administered 2024-04-06 – 2024-04-09 (×4): 50 mg via ORAL
  Filled 2024-04-05 (×4): qty 1

## 2024-04-05 MED ORDER — POLYETHYLENE GLYCOL 3350 17 G PO PACK
17.0000 g | PACK | Freq: Every day | ORAL | Status: DC
  Administered 2024-04-06 – 2024-04-09 (×3): 17 g via ORAL
  Filled 2024-04-05 (×3): qty 1

## 2024-04-05 MED ORDER — BUPROPION HCL ER (XL) 150 MG PO TB24
150.0000 mg | ORAL_TABLET | Freq: Every day | ORAL | Status: DC
  Administered 2024-04-06 – 2024-04-09 (×4): 150 mg via ORAL
  Filled 2024-04-05 (×4): qty 1

## 2024-04-05 MED ORDER — DOCUSATE SODIUM 100 MG PO CAPS
100.0000 mg | ORAL_CAPSULE | Freq: Every day | ORAL | Status: DC
  Administered 2024-04-05 – 2024-04-08 (×4): 100 mg via ORAL
  Filled 2024-04-05 (×4): qty 1

## 2024-04-05 MED ORDER — VANCOMYCIN HCL 750 MG/150ML IV SOLN
750.0000 mg | INTRAVENOUS | Status: DC
  Administered 2024-04-06: 750 mg via INTRAVENOUS
  Filled 2024-04-05 (×2): qty 150

## 2024-04-05 MED ORDER — ROPINIROLE HCL 1 MG PO TABS
2.0000 mg | ORAL_TABLET | Freq: Every day | ORAL | Status: DC
  Administered 2024-04-06 – 2024-04-08 (×3): 2 mg via ORAL
  Filled 2024-04-05 (×3): qty 2

## 2024-04-05 MED ORDER — GUAIFENESIN ER 600 MG PO TB12
600.0000 mg | ORAL_TABLET | Freq: Two times a day (BID) | ORAL | Status: DC
  Administered 2024-04-05 – 2024-04-09 (×8): 600 mg via ORAL
  Filled 2024-04-05 (×8): qty 1

## 2024-04-05 MED ORDER — HEPARIN SODIUM (PORCINE) 5000 UNIT/ML IJ SOLN
5000.0000 [IU] | Freq: Three times a day (TID) | INTRAMUSCULAR | Status: DC
  Administered 2024-04-05 – 2024-04-09 (×10): 5000 [IU] via SUBCUTANEOUS
  Filled 2024-04-05 (×9): qty 1

## 2024-04-05 MED ORDER — ROPINIROLE HCL 1 MG PO TABS: 1.0000 mg | ORAL_TABLET | ORAL | Status: DC

## 2024-04-05 MED ORDER — TRAZODONE HCL 50 MG PO TABS
25.0000 mg | ORAL_TABLET | Freq: Every evening | ORAL | Status: DC | PRN
  Administered 2024-04-07 – 2024-04-08 (×2): 25 mg via ORAL
  Filled 2024-04-05 (×2): qty 1

## 2024-04-05 MED ORDER — MEMANTINE HCL 10 MG PO TABS
5.0000 mg | ORAL_TABLET | Freq: Two times a day (BID) | ORAL | Status: DC
  Administered 2024-04-05 – 2024-04-09 (×8): 5 mg via ORAL
  Filled 2024-04-05 (×8): qty 1

## 2024-04-05 MED ORDER — ONDANSETRON HCL 4 MG/2ML IJ SOLN: 4.0000 mg | Freq: Four times a day (QID) | INTRAMUSCULAR | Status: DC | PRN

## 2024-04-05 MED ORDER — FUROSEMIDE 10 MG/ML IJ SOLN
40.0000 mg | Freq: Once | INTRAMUSCULAR | Status: AC
  Administered 2024-04-05: 40 mg via INTRAVENOUS
  Filled 2024-04-05: qty 4

## 2024-04-05 MED ORDER — CARVEDILOL 3.125 MG PO TABS
6.2500 mg | ORAL_TABLET | Freq: Two times a day (BID) | ORAL | Status: DC
  Administered 2024-04-05 – 2024-04-06 (×2): 6.25 mg via ORAL
  Filled 2024-04-05 (×2): qty 2

## 2024-04-05 MED ORDER — ENOXAPARIN SODIUM 40 MG/0.4ML IJ SOSY: 40.0000 mg | PREFILLED_SYRINGE | INTRAMUSCULAR | Status: DC

## 2024-04-05 MED ORDER — FOLIC ACID 1 MG PO TABS
1.0000 mg | ORAL_TABLET | Freq: Every day | ORAL | Status: DC
  Administered 2024-04-06 – 2024-04-09 (×3): 1 mg via ORAL
  Filled 2024-04-05 (×3): qty 1

## 2024-04-05 MED ORDER — FERROUS SULFATE 325 (65 FE) MG PO TABS
325.0000 mg | ORAL_TABLET | Freq: Every day | ORAL | Status: DC
  Administered 2024-04-06 – 2024-04-09 (×3): 325 mg via ORAL
  Filled 2024-04-05 (×4): qty 1

## 2024-04-05 NOTE — Progress Notes (Signed)
 "  Subjective:     Patient ID: [name], female    DOB: Oct 17, 1929, 89 y.o.   MRN: [id number]  Chief Complaint  Patient presents with   Follow-up    [name] is here for follow up from the hospital today    Discussed the use of AI scribe software for clinical note transcription with the patient, who gave verbal consent to proceed.  History of Present Illness [name] is a 89 year old female with heart failure who presents for f/u but  with worsening shortness of breath and fluid retention. She is accompanied by her daughter, who is her primary caregiver.  She has been experiencing worsening shortness of breath and fluid retention, with increased swelling in her feet and face, and fatigue since today. These symptoms began yesterday while she was sitting in her chair. Her breathing has become more labored. She was recently hospitalized due to fluid buildup and was given a diuretic, which helped reduce the swelling. However, since being discharged, the fluid has started to accumulate again.  She has a history of heart failure and was previously on amlodipine  and Benicar , but is no longer taking these medications. She is currently on spironolactone , taking half a tablet daily, and Entresto . Her daughter is concerned about the cause of her heart failure, as she has never had a heart attack and her blood pressure was previously well-controlled. Her ejection fraction is low and that she is on Entresto . There is also a concern about the impact of her medications on her kidney function, as adjustments were made during her recent hospital stay.  She has a history of COPD and uses albuterol  for her breathing difficulties. Her daughter administers albuterol  via nebulizer twice a day, and sometimes more frequently when needed. She also uses a Pulmicort  inhaler. Her breathing difficulties are more pronounced in the morning, requiring the use of a nebulizer to help her breathe and rest.  She has been  experiencing dizziness when sitting up, which may be related to her blood pressure. She has also been bruising easily, which her daughter attributes to minor accidents with her wheelchair.  Her daughter is managing her care at home, ensuring she is comfortable and has access to necessary medical equipment, such as a hospital bed and bedside commode. She is concerned about her fluid intake and the balance required to manage her kidney function and fluid retention.    Health Maintenance Due  Topic Date Due   Bone Density Scan  06/09/2022    Past Medical History:  Diagnosis Date   Abdominal bloating    Abnormal LFTs (liver function tests)    Ankle fracture, bimalleolar, closed, right, initial encounter    Anxiety disorder    Arthritis    Asthma    Bimalleolar ankle fracture, right, closed, initial encounter 01/06/2019   Depression    Diverticulosis    Edema    Fatty liver    Fibromyalgia    GERD (gastroesophageal reflux disease)    Giant cell arteritis (HCC)    No biopsy secondary to steroid use   Hx of adenomatous colonic polyps    Hyperlipidemia    Hypertension    IBS (irritable bowel syndrome)    Lumbar radiculitis    Osteoporosis 09/2017   T score -2.6 left femoral neck   Peptic ulcer disease    Skin cancer    shoulder, leg, right eye (? squamous by resection)   Thoracic radiculitis    Thyroid  nodule  removed   Urinary incontinence    Vertigo     Past Surgical History:  Procedure Laterality Date   ABDOMINAL HYSTERECTOMY     BSO   BACK SURGERY  2005   lower back   EYE SURGERY Right 2006   LIVER BIOPSY     fatty liver   NASAL SEPTUM SURGERY     ORIF ANKLE FRACTURE Right 01/11/2019   Procedure: OPEN REDUCTION INTERNAL FIXATION (ORIF) RIGHT BIMALLEOLAR ANKLE FRACTURE;  Surgeon: Jerri Kay HERO, MD;  Location: [facility];  Service: Orthopedics;  Laterality: Right;   right knee surgery  2006   arthroscopy   right leg skin cancer surgery  2014    right shoulder skin cancer excision  2012   SKIN CANCER EXCISION     THYROIDECTOMY      Current Medications[1]  Allergies[2] ROS neg/noncontributory except as noted HPI/below      Objective:     BP 120/84 (BP Location: Left Arm, Patient Position: Sitting, Cuff Size: Normal)   Pulse (!) 56   Temp (!) 97 F (36.1 C) (Temporal)   Ht 5' 3 (1.6 m)   Wt 163 lb 8 oz (74.2 kg)   SpO2 99%   BMI 28.96 kg/m  Wt Readings from Last 3 Encounters:  04/05/24 163 lb 8 oz (74.2 kg)  03/30/24 155 lb 12.8 oz (70.7 kg)  03/29/24 161 lb 6 oz (73.2 kg)    Physical Exam VITALS: RR- 30 GENERAL: Well developed, well nourished, in w/c with increased work of breathing and drowsy. HEAD EYES EARS NOSE THROAT: Normocephalic, atraumatic, conjunctiva not injected, sclera nonicteric. CARDIAC: Regular rate and rhythm, distant S1 S2 present, dorsalis pedis 2 plus bilaterally. But feet cool NECK: Supple, no thyromegaly, no nodes, no carotid bruits. LUNGS: Crackles and fluid present, no wheezes. Shallow breathing ABDOMEN: Bowel sounds present, soft, non-tender, sl-distended, no hepatosplenomegaly, no masses. EXTREMITIES: 1+ pitting edema, great pulses, feet cold. MUSCULOSKELETAL: in w/c NEUROLOGICAL: Alert and oriented x1, cranial nerves II through XII intact. PSYCHIATRIC: falling asleep       Assessment & Plan:  Acute systolic heart failure (HCC)  Acute pulmonary edema (HCC)  Other orders -     Albuterol  Sulfate; Take 3 mLs (2.5 mg total) by nebulization every 6 (six) hours as needed for wheezing or shortness of breath.  Dispense: 360 mL; Refill: 3    Assessment and Plan Assessment & Plan Acute systolic heart failure with pulmonary edema   She experiences an acute exacerbation with rapid fluid accumulation, labored breathing, and lung crackles. Oxygen  saturation remains adequate, but respiratory effort is increased. The differential includes fluid overload from heart failure and possible liver  dysfunction. High BNP levels confirmed significant fluid overload but improved upon d/c. Due to rapid symptom progression and increased respiratory effort, she is referred to the hospital for management.  Her respiratory status and fluid balance are closely monitored. Cardiology is consulted for further management and evaluation-has appt tomorrow but may or may not be admitted to hosp.  Chronic obstructive pulmonary disease (COPD)   She has COPD with increased respiratory effort and cough. Albuterol  and nebulizer treatments provide temporary relief. Oxygen  saturation is adequate, but respiratory effort is increased. There is a discussion about potentially increasing albuterol  use and monitoring respiratory status. She will continue albuterol  nebulizer treatments as needed, with adjustments to treatment based on respiratory status. Albuterol  solution and inhalers are ensured to be available.  Cancer-unknown but daughter doesn't want w/u d/t [name] age, co-morbidity, etc.  [name] declining-discussed Hospice(daughter wasn't prepared to hear that) but we discussed and daughter will consider.    Reviewed med list.  No changes   Return for assch 3/18.  Jenkins CHRISTELLA Carrel, MD     [1]  Current Outpatient Medications:    acetaminophen  (TYLENOL ) 500 MG tablet, Take 1 tablet (500 mg total) by mouth 3 (three) times daily., Disp: 30 tablet, Rfl: 0   ARIPiprazole  (ABILIFY ) 2 MG tablet, TAKE 1 TABLET BY MOUTH EVERY DAY *NEW PRESCRIPTION REQUEST*, Disp: 90 tablet, Rfl: 3   ASPIRIN  LOW DOSE 81 MG tablet, TAKE 1 TABLET BY MOUTH EVERY DAY *NEW PRESCRIPTION REQUEST*, Disp: 90 tablet, Rfl: 11   budesonide  (PULMICORT ) 1 MG/2ML nebulizer solution, INHALE THE CONTENTS OF 1 VIAL VIA NEBULIZER TWICE DAILY *NEW PRESCRIPTION REQUEST*, Disp: 180 mL, Rfl: 11   buPROPion  (WELLBUTRIN  XL) 150 MG 24 hr tablet, Take 1 tablet (150 mg total) by mouth in the morning., Disp: 90 tablet, Rfl: 3   calcium -vitamin D  (OSCAL WITH D) 500-200 MG-UNIT  tablet, Take 1 tablet by mouth 3 (three) times daily., Disp: 90 tablet, Rfl: 6   carvedilol  (COREG ) 6.25 MG tablet, Take 1 tablet (6.25 mg total) by mouth 2 (two) times daily with a meal., Disp: 60 tablet, Rfl: 0   cephALEXin  (KEFLEX ) 250 MG capsule, Take 1 capsule (250 mg total) by mouth daily. Resume after finished with antibiotics from hospitalization, Disp: , Rfl:    Cholecalciferol  (VITAMIN D ) 50 MCG (2000 UT) tablet, Take 2,000 Units by mouth daily., Disp: , Rfl:    cyanocobalamin  (VITAMIN B12) 1000 MCG/ML injection, Inject 1 mL (1,000 mcg total) into the muscle every 30 (thirty) days., Disp: 3 mL, Rfl: 3   diazepam  (VALIUM ) 5 MG tablet, Take 1 tablet (5 mg total) by mouth See admin instructions. Take 5 mg by mouth in the evening AS NEEDED for restlessness and an additional 5 mg once a day as needed for anxiety, Disp: 100 tablet, Rfl: 1   docusate sodium  (COLACE) 100 MG capsule, Take 1 capsule (100 mg total) by mouth daily., Disp: 90 capsule, Rfl: 11   estradiol (ESTRACE) 0.1 MG/GM vaginal cream, Place 1 Applicatorful vaginally daily as needed (burning)., Disp: , Rfl:    ferrous sulfate  325 (65 FE) MG tablet, Take 1 tablet (325 mg total) by mouth daily with breakfast., Disp: 30 tablet, Rfl: 0   folic acid  (FOLVITE ) 1 MG tablet, Take 1 tablet (1 mg total) by mouth daily., Disp: 100 tablet, Rfl: 3   guaiFENesin  (MUCINEX ) 600 MG 12 hr tablet, Take 600 mg by mouth 2 (two) times daily., Disp: , Rfl:    HYDROcodone -acetaminophen  (NORCO/VICODIN) 5-325 MG tablet, Take 1 tablet by mouth every 6 (six) hours as needed for moderate pain (pain score 4-6)., Disp: 20 tablet, Rfl: 0   memantine  (NAMENDA ) 5 MG tablet, Take 1 tablet (5 mg total) by mouth in the morning and at bedtime., Disp: 180 tablet, Rfl: 3   omega-3 acid ethyl esters (LOVAZA ) 1 g capsule, Take 2 capsules (2 g total) by mouth 2 (two) times daily., Disp: 360 capsule, Rfl: 3   omeprazole  (PRILOSEC) 40 MG capsule, TAKE ONE (1) CAPSULE BY MOUTH  TWICE DAILY *NEW PRESCRIPTION REQUEST*, Disp: 180 capsule, Rfl: 3   ondansetron  (ZOFRAN -ODT) 8 MG disintegrating tablet, Take 1 tablet (8 mg total) by mouth every 8 (eight) hours as needed for nausea or vomiting. PLACE 1 TABLET ON TONGUE EVERY 8 HOURS AS NEEDED FOR NAUSEA OR VOMITING, Disp: 100 tablet, Rfl: 1  polyethylene glycol (MIRALAX  / GLYCOLAX ) 17 g packet, Take 17 g by mouth daily., Disp: 100 each, Rfl: 3   predniSONE  (DELTASONE ) 10 MG tablet, Take 3 tabs daily x 2 days, then 2 tabs daily x 2 days,then 1 tab daily x 2 days then resume home the dose, Disp: 12 tablet, Rfl: 0   predniSONE  (DELTASONE ) 5 MG tablet, Take 1 tablet (5 mg total) by mouth at bedtime. Resume after finished with prednisone  taper from hospitalization, Disp: , Rfl:    Probiotic CHEW, Chew 1 each by mouth every other day., Disp: 100 tablet, Rfl: 3   rOPINIRole  (REQUIP ) 1 MG tablet, Take 1 tablet (1 mg total) by mouth as directed. TAKE 1 TABLET IN THE MORNING AND 2 TABLETS IN THE EVENING, Disp: 270 tablet, Rfl: 3   sacubitril -valsartan  (ENTRESTO ) 24-26 MG, Take 1 tablet by mouth 2 (two) times daily., Disp: 60 tablet, Rfl: 0   sertraline  (ZOLOFT ) 50 MG tablet, Take 1 tablet (50 mg total) by mouth in the morning., Disp: 90 tablet, Rfl: 3   spironolactone  (ALDACTONE ) 25 MG tablet, Take 0.5 tablets (12.5 mg total) by mouth daily., Disp: 15 tablet, Rfl: 0   SYSTANE 0.4-0.3 % SOLN, Place 1 drop into both eyes 4 (four) times daily as needed (for dryness)., Disp: 30 mL, Rfl: 3   Tiotropium Bromide  (SPIRIVA  RESPIMAT) 2.5 MCG/ACT AERS, Inhale 2 Inhalations into the lungs daily at 12 noon., Disp: 12 g, Rfl: 3   tocilizumab  4 mg/kg in sodium chloride  0.9 %, Inject 280 mg into the vein every 28 (twenty-eight) days., Disp: , Rfl:    albuterol  (PROVENTIL ) (2.5 MG/3ML) 0.083% nebulizer solution, Take 3 mLs (2.5 mg total) by nebulization every 6 (six) hours as needed for wheezing or shortness of breath., Disp: 360 mL, Rfl: 3 [2]   Allergies Allergen Reactions   Oxycodone  Itching and Other (See Comments)    Hallucinations   Statins Other (See Comments)    Myalgia    Advair Diskus [Fluticasone -Salmeterol] Other (See Comments)    Mouth sores   Atorvastatin  Other (See Comments)    Severe headache   Sulfonamide Derivatives Rash   "

## 2024-04-05 NOTE — Progress Notes (Signed)
 Pharmacy Antibiotic Note  [name] is a 89 y.o. female admitted on 04/05/2024 with pneumonia.  Pharmacy has been consulted for vanc/cefepime  dosing.  Plan: Vancomycin  1500mg  IV x 1 then 750mg  IV q24 - goal AUC 400-550 Cefepime  2g IV q12 per current renal function F/up MRSA PCR and d/c vanc if not detected  Height: 5' 3 (160 cm) Weight: 73.9 kg (163 lb) IBW/kg (Calculated) : 52.4  Temp (24hrs), Avg:97.5 F (36.4 C), Min:97 F (36.1 C), Max:97.7 F (36.5 C)  Recent Labs  Lab 04/05/24 1608  WBC 14.5*  CREATININE 0.92    Estimated Creatinine Clearance: 37.6 mL/min (by C-G formula based on SCr of 0.92 mg/dL).    Allergies[1]   Thank you for allowing pharmacy to be a part of this patients care.  Britta Eva Na 04/05/2024 5:10 PM     [1]  Allergies Allergen Reactions   Oxycodone  Itching and Other (See Comments)    Hallucinations   Statins Other (See Comments)    Myalgia    Advair Diskus [Fluticasone -Salmeterol] Other (See Comments)    Mouth sores   Atorvastatin  Other (See Comments)    Severe headache   Sulfonamide Derivatives Rash

## 2024-04-05 NOTE — ED Triage Notes (Signed)
 Pt comes in for bilateral leg swelling and some facial swelling and SOB, diminished lung sounds, hx of CHF, dementia.

## 2024-04-05 NOTE — Patient Instructions (Addendum)
It was very nice to see you today!  Go to ER   PLEASE NOTE:  If you had any lab tests please let us know if you have not heard back within a few days. You may see your results on MyChart before we have a chance to review them but we will give you a call once they are reviewed by [place]. If we ordered any referrals today, please let us know if you have not heard from their office within the next week.   Please try these tips to maintain a healthy lifestyle:  Eat most of your calories during the day when you are active. Eliminate processed foods including packaged sweets (pies, cakes, cookies), reduce intake of potatoes, white bread, white pasta, and white rice. Look for whole grain options, oat flour or almond flour.  Each meal should contain half fruits/vegetables, one quarter protein, and one quarter carbs (no bigger than a computer mouse).  Cut down on sweet beverages. This includes juice, soda, and sweet tea. Also watch fruit intake, though this is a healthier sweet option, it still contains natural sugar! Limit to 3 servings daily.  Drink at least 1 glass of water with each meal and aim for at least 8 glasses per day  Exercise at least 150 minutes every week.

## 2024-04-05 NOTE — ED Provider Notes (Signed)
 " [place] EMERGENCY DEPARTMENT AT [facility] Provider Note   CSN: [id number] Arrival date & time: 04/05/24  1402     Patient presents with: Shortness of Breath and Leg Swelling   [name] is a 89 y.o. female.  With a history of heart failure, asthma, hypertension and dementia who presents to the ED for shortness of breath.  Patient was recently admitted for heart failure exacerbation and discharged 1 week ago under the care of her daughter at home.  She is on 2 L oxygen  baseline and has a hospital bed at home.  She had a discharge follow-up appointment with her PCP office.  Daughter notes concern for increasing peripheral edema and shortness of breath despite home O2 therapy, daughter increased to 3 L O2 today.  No fevers chills patient has been eating and drinking.  Patient has denied chest pain.  Unable to contribute additional history secondary to dementia    Shortness of Breath      Prior to Admission medications  Medication Sig Start Date End Date Taking? Authorizing Provider  acetaminophen  (TYLENOL ) 500 MG tablet Take 1 tablet (500 mg total) by mouth 3 (three) times daily. 01/03/24   Regalado, Belkys A, MD  albuterol  (PROVENTIL ) (2.5 MG/3ML) 0.083% nebulizer solution Take 3 mLs (2.5 mg total) by nebulization every 6 (six) hours as needed for wheezing or shortness of breath. 04/05/24   Wendolyn Jenkins Jansky, MD  ARIPiprazole  (ABILIFY ) 2 MG tablet TAKE 1 TABLET BY MOUTH EVERY DAY *NEW PRESCRIPTION REQUEST* 01/07/24   Wendolyn Jenkins Jansky, MD  ASPIRIN  LOW DOSE 81 MG tablet TAKE 1 TABLET BY MOUTH EVERY DAY *NEW PRESCRIPTION REQUEST* 01/07/24   Wendolyn Jenkins Jansky, MD  budesonide  (PULMICORT ) 1 MG/2ML nebulizer solution INHALE THE CONTENTS OF 1 VIAL VIA NEBULIZER TWICE DAILY *NEW PRESCRIPTION REQUEST* 01/07/24   Wendolyn Jenkins Jansky, MD  buPROPion  (WELLBUTRIN  XL) 150 MG 24 hr tablet Take 1 tablet (150 mg total) by mouth in the morning. 01/07/24   Wendolyn Jenkins Jansky, MD  calcium -vitamin D   (OSCAL WITH D) 500-200 MG-UNIT tablet Take 1 tablet by mouth 3 (three) times daily. 01/11/19   Jerri Kay HERO, MD  carvedilol  (COREG ) 6.25 MG tablet Take 1 tablet (6.25 mg total) by mouth 2 (two) times daily with a meal. 03/29/24 04/28/24  Christobal Guadalajara, MD  cephALEXin  (KEFLEX ) 250 MG capsule Take 1 capsule (250 mg total) by mouth daily. Resume after finished with antibiotics from hospitalization 02/28/24   Tat, Alm, MD  Cholecalciferol  (VITAMIN D ) 50 MCG (2000 UT) tablet Take 2,000 Units by mouth daily. 03/02/24   [provider]  cyanocobalamin  (VITAMIN B12) 1000 MCG/ML injection Inject 1 mL (1,000 mcg total) into the muscle every 30 (thirty) days. 01/07/24   Wendolyn Jenkins Jansky, MD  diazepam  (VALIUM ) 5 MG tablet Take 1 tablet (5 mg total) by mouth See admin instructions. Take 5 mg by mouth in the evening AS NEEDED for restlessness and an additional 5 mg once a day as needed for anxiety 01/13/24   Wendolyn Jenkins Jansky, MD  docusate sodium  (COLACE) 100 MG capsule Take 1 capsule (100 mg total) by mouth daily. 01/07/24   Wendolyn Jenkins Jansky, MD  estradiol (ESTRACE) 0.1 MG/GM vaginal cream Place 1 Applicatorful vaginally daily as needed (burning). 03/30/21   [provider]  ferrous sulfate  325 (65 FE) MG tablet Take 1 tablet (325 mg total) by mouth daily with breakfast. 03/30/24 04/29/24  Christobal Guadalajara, MD  folic acid  (FOLVITE ) 1 MG tablet Take  1 tablet (1 mg total) by mouth daily. 03/03/24   Wendolyn Jenkins Jansky, MD  guaiFENesin  (MUCINEX ) 600 MG 12 hr tablet Take 600 mg by mouth 2 (two) times daily.    [provider]  HYDROcodone -acetaminophen  (NORCO/VICODIN) 5-325 MG tablet Take 1 tablet by mouth every 6 (six) hours as needed for moderate pain (pain score 4-6). 03/03/24   Wendolyn Jenkins Jansky, MD  memantine  (NAMENDA ) 5 MG tablet Take 1 tablet (5 mg total) by mouth in the morning and at bedtime. 01/07/24   Wendolyn Jenkins Jansky, MD  omega-3 acid ethyl esters (LOVAZA ) 1 g capsule Take 2 capsules (2 g total)  by mouth 2 (two) times daily. 01/22/24   Wendolyn Jenkins Jansky, MD  omeprazole  (PRILOSEC) 40 MG capsule TAKE ONE (1) CAPSULE BY MOUTH TWICE DAILY *NEW PRESCRIPTION REQUEST* 01/07/24   Wendolyn Jenkins Jansky, MD  ondansetron  (ZOFRAN -ODT) 8 MG disintegrating tablet Take 1 tablet (8 mg total) by mouth every 8 (eight) hours as needed for nausea or vomiting. PLACE 1 TABLET ON TONGUE EVERY 8 HOURS AS NEEDED FOR NAUSEA OR VOMITING 01/13/24   Wendolyn Jenkins Jansky, MD  polyethylene glycol (MIRALAX  / GLYCOLAX ) 17 g packet Take 17 g by mouth daily. 01/13/24   Wendolyn Jenkins Jansky, MD  predniSONE  (DELTASONE ) 10 MG tablet Take 3 tabs daily x 2 days, then 2 tabs daily x 2 days,then 1 tab daily x 2 days then resume home the dose 03/30/24   Christobal Guadalajara, MD  predniSONE  (DELTASONE ) 5 MG tablet Take 1 tablet (5 mg total) by mouth at bedtime. Resume after finished with prednisone  taper from hospitalization 02/28/24   Evonnie Lenis, MD  Probiotic CHEW Chew 1 each by mouth every other day. 01/07/24   Wendolyn Jenkins Jansky, MD  rOPINIRole  (REQUIP ) 1 MG tablet Take 1 tablet (1 mg total) by mouth as directed. TAKE 1 TABLET IN THE MORNING AND 2 TABLETS IN THE EVENING 01/22/24   Wendolyn Jenkins Jansky, MD  sacubitril -valsartan  (ENTRESTO ) 24-26 MG Take 1 tablet by mouth 2 (two) times daily. 03/29/24 04/28/24  Christobal Guadalajara, MD  sertraline  (ZOLOFT ) 50 MG tablet Take 1 tablet (50 mg total) by mouth in the morning. 01/07/24   Wendolyn Jenkins Jansky, MD  spironolactone  (ALDACTONE ) 25 MG tablet Take 0.5 tablets (12.5 mg total) by mouth daily. 03/30/24 04/29/24  Christobal Guadalajara, MD  SYSTANE 0.4-0.3 % SOLN Place 1 drop into both eyes 4 (four) times daily as needed (for dryness). 01/13/24   Wendolyn Jenkins Jansky, MD  Tiotropium Bromide  (SPIRIVA  RESPIMAT) 2.5 MCG/ACT AERS Inhale 2 Inhalations into the lungs daily at 12 noon. 01/22/24   Wendolyn Jenkins Jansky, MD  tocilizumab  4 mg/kg in sodium chloride  0.9 % Inject 280 mg into the vein every 28 (twenty-eight) days. 12/03/16   [provider]     Allergies: Oxycodone , Statins, Advair diskus [fluticasone -salmeterol], Atorvastatin , and Sulfonamide derivatives    Review of Systems  Respiratory:  Positive for shortness of breath.     Updated Vital Signs BP 139/75   Pulse 61   Temp 97.7 F (36.5 C)   Resp (!) 22   Ht 5' 3 (1.6 m)   Wt 73.9 kg   SpO2 98%   BMI 28.87 kg/m   Physical Exam Vitals and nursing note reviewed.  HENT:     Head: Normocephalic and atraumatic.  Eyes:     Pupils: Pupils are equal, round, and reactive to light.  Cardiovascular:     Rate and Rhythm: Normal rate and regular rhythm.  Pulmonary:     Effort: Pulmonary effort is normal.     Breath sounds: Examination of the right-lower field reveals rales. Examination of the left-lower field reveals rales. Rales present.     Comments: Productive sounding cough on exam Abdominal:     Palpations: Abdomen is soft.     Tenderness: There is no abdominal tenderness.  Musculoskeletal:     Right lower leg: Edema present.     Left lower leg: Edema present.  Skin:    General: Skin is warm and dry.  Neurological:     General: No focal deficit present.     Mental Status: She is alert.  Psychiatric:        Mood and Affect: Mood normal.     (all labs ordered are listed, but only abnormal results are displayed) Labs Reviewed  BASIC METABOLIC PANEL WITH GFR - Abnormal; Notable for the following components:      Result Value   Potassium 5.3 (*)    Glucose, Bld 143 (*)    BUN 27 (*)    GFR, Estimated 58 (*)    All other components within normal limits  CBC WITH DIFFERENTIAL/PLATELET - Abnormal; Notable for the following components:   WBC 14.5 (*)    RBC 3.58 (*)    Hemoglobin 10.3 (*)    HCT 34.7 (*)    MCHC 29.7 (*)    Neutro Abs 12.0 (*)    Abs Immature Granulocytes 0.08 (*)    All other components within normal limits  PRO BRAIN NATRIURETIC PEPTIDE - Abnormal; Notable for the following components:   Pro Brain Natriuretic Peptide >35,000.0 (*)     All other components within normal limits  CULTURE, BLOOD (ROUTINE X 2)  CULTURE, BLOOD (ROUTINE X 2)  MRSA NEXT GEN BY PCR, NASAL  BASIC METABOLIC PANEL WITH GFR  CBC  PRO BRAIN NATRIURETIC PEPTIDE  I-STAT CG4 LACTIC ACID, ED    EKG: None  Radiology: DG Chest Portable 1 View Result Date: 04/05/2024 EXAM: 1 VIEW(S) XRAY OF THE CHEST 04/05/2024 03:54:00 PM COMPARISON: 03/26/2024 CLINICAL HISTORY: hf hf hf FINDINGS: LUNGS AND PLEURA: Left basilar airspace opacity with obscuration of the diaphragmatic leaflet. Improving atelectasis or pneumonia at the right lung base. Small left pleural effusion. No pneumothorax. HEART AND MEDIASTINUM: Heart size at upper limits. Aortic atherosclerosis. BONES AND SOFT TISSUES: No acute osseous abnormality. IMPRESSION: 1. Left basilar airspace opacity with obscuration of the diaphragmatic leaflet. 2. Improving atelectasis or pneumonia at the right lung base. 3. Small left pleural effusion. Electronically signed by: Greig Pique MD 04/05/2024 04:26 PM EST RP Workstation: [id number]     Procedures   Medications Ordered in the ED  ceFEPIme  (MAXIPIME ) 2 g in sodium chloride  0.9 % 100 mL IVPB (has no administration in time range)  vancomycin  (VANCOREADY) IVPB 1500 mg/300 mL (has no administration in time range)  vancomycin  (VANCOREADY) IVPB 750 mg/150 mL (has no administration in time range)  ceFEPIme  (MAXIPIME ) 2 g in sodium chloride  0.9 % 100 mL IVPB (has no administration in time range)  carvedilol  (COREG ) tablet 6.25 mg (has no administration in time range)  sacubitril -valsartan  (ENTRESTO ) 24-26 mg per tablet (has no administration in time range)  spironolactone  (ALDACTONE ) tablet 12.5 mg (has no administration in time range)  buPROPion  (WELLBUTRIN  XL) 24 hr tablet 150 mg (has no administration in time range)  sertraline  (ZOLOFT ) tablet 50 mg (has no administration in time range)  memantine  (NAMENDA ) tablet 5 mg (has no administration in time range)  predniSONE  (DELTASONE ) tablet 5 mg (has no administration in time range)  docusate sodium  (COLACE) capsule 100 mg (has no administration in time range)  polyethylene glycol (MIRALAX  / GLYCOLAX ) packet 17 g (has no administration in time range)  ferrous sulfate  tablet 325 mg (has no administration in time range)  rOPINIRole  (REQUIP ) tablet 1 mg (has no administration in time range)  budesonide  (PULMICORT ) nebulizer solution 1 mg (has no administration in time range)  guaiFENesin  (MUCINEX ) 12 hr tablet 600 mg (has no administration in time range)  Tiotropium Bromide  AERS 2 Inhalation (has no administration in time range)  Polyethyl Glycol-Propyl Glycol 0.4-0.3 % SOLN 1 drop (has no administration in time range)  folic acid  (FOLVITE ) tablet 1 mg (has no administration in time range)  pantoprazole  (PROTONIX ) EC tablet 40 mg (has no administration in time range)  ARIPiprazole  (ABILIFY ) tablet 2 mg (has no administration in time range)  acetaminophen  (TYLENOL ) tablet 650 mg (has no administration in time range)    Or  acetaminophen  (TYLENOL ) suppository 650 mg (has no administration in time range)  traZODone  (DESYREL ) tablet 25 mg (has no administration in time range)  ondansetron  (ZOFRAN ) tablet 4 mg (has no administration in time range)    Or  ondansetron  (ZOFRAN ) injection 4 mg (has no administration in time range)  albuterol  (PROVENTIL ) (2.5 MG/3ML) 0.083% nebulizer solution 2.5 mg (has no administration in time range)  heparin  injection 5,000 Units (has no administration in time range)  furosemide  (LASIX ) injection 40 mg (40 mg Intravenous Given 04/05/24 1613)    Clinical Course as of 04/05/24 1753  Mon Apr 05, 2024  1645 Laboratory workup notable for leukocytosis 14.5.  BNP significantly elevated from previous.  Chest x-ray concerning for interval development of left-sided pneumonia.  Will cover for healthcare associated pneumonia and plan for admission to medicine service [MP]  1704  Discussed with admitting hospitalist who accepts patient for admission [MP]    Clinical Course User Index [MP] Pamella Ozell LABOR, DO                                 Medical Decision Making 89 year old female with history as above presents to the ED for increasing shortness of breath and peripheral edema.  Recent admission for heart failure exacerbation.  Increased to 3 L O2 nasal cannula up from 2 L at baseline.  Increasing peripheral edema.  Daughter does state she takes half a pill of Lasix  but I did not see this in her discharge medications from a week ago.  Stable on 2 L upon my initial assessment no significant tachypnea or tachycardia.  Rales on lower lung fields.  Trace edema in lower extremities symmetric.  Differential diagnosis includes heart failure exacerbation, pneumonia, asthma exacerbation, viral respiratory illness and dysrhythmia.  Low suspicion for ACS without reported chest pain or any discomfort.  Will obtain laboratory workup EKG chest x-ray give dose of Lasix  here and continue to monitor respiratory status closely on supplemental oxygen .  Amount and/or Complexity of Data Reviewed Labs: ordered. Radiology: ordered.  Risk Prescription drug management. Decision regarding hospitalization.        Final diagnoses:  Healthcare-associated pneumonia  Acute on chronic congestive heart failure, unspecified heart failure type [facility])    ED Discharge Orders     None          Pamella Ozell LABOR, DO 04/05/24 1753  "

## 2024-04-05 NOTE — Progress Notes (Unsigned)
" °  Cardiology Office Note   Date: 04/06/2024  ID:  [name] [birth year] [id number] PCP: Wendolyn Jenkins Jansky, MD  [facility] Providers Cardiologist: Redell Shallow, MD { Click to update primary MD,subspecialty MD or APP then REFRESH:1}    Chief Complaint: [name] is a 89 y.o.female with PMH of HFrEF with LVEF 35-40% on echo 03/23/2024, hypertension, orthostatic hypotension, CKD stage III, chronic respiratory failure due to asthma on 2L home O2, giant cell arteritis/polymyalgia rheumatica on chronic steroids, dementia, liver mass who presents to the clinic for post-hospital follow-up.    [name] was seen in clinic by Dr. Okey in 2016 for dyspnea. Echo 07/05/2014 showed LVEF 55-60%. Stress test 08/11/2014 normal. It was felt that her symptoms were not related to a cardiac cause.  She presented to the ER 03/22/2024 with a cough and dyspnea. Respiratory panel negative. Echo 03/23/2024 showed LVEF decreased to 35-40% with global hypokinesis, mild LVH, normal RV, moderate MR. Also noted to have a moderate pleural effusion. Thought possibly a stress-induced cardiomyopathy, her son passed away the week prior. Cardiology was consulted. She was diuresed with IV Lasix  with improvement in her symptoms. She was discharged home 03/29/2024 on carvedilol , Entresto , spironolactone .     History of Present Illness: Today ***  ROS: Denies chest pain, shortness of breath, orthopnea, PND, lower extremity edema, palpitations, lightheadedness, dizziness, syncope. ***  HFrEF: Noted LVEF echo 35-40% on echo 03/23/2024, thought to be in the setting of possible stress, given that her son passed away the week prior. Ischemic evaluation not pursued. Her symptoms are significantly improved. She appears euvolemic on exam today. - Check BMP today - Continue carvedilol  6.25 mg twice daily - Continue Entresto  24-26 mg twice daily - Continue spironolactone  12.5 mg daily - SGLT2i deferred due to limited mobility and  UTIs  Hypertension: BP today *** - Treatment in the setting of HF as above  CKD stage III: SCr increased to 1.41 during hospitalization, was 1.11 on day of discharge. GFR 46. - Recheck BMP today to ensure electrolytes and kidney function stable  Studies Reviewed: The following studies were reviewed today: ***      Risk Assessment/Calculations: {Does this patient have ATRIAL FIBRILLATION?:[phone]}               Physical Exam: VS: There were no vitals taken for this visit. Wt Readings from Last 3 Encounters:  04/05/24 163 lb (73.9 kg)  04/05/24 163 lb 8 oz (74.2 kg)  03/30/24 155 lb 12.8 oz (70.7 kg)     GEN: *** Well nourished, in NAD HEENT: Normal NECK: No JVD, no carotid bruits LYMPHATICS: No lymphadenopathy CARDIAC: ***RRR, no murmurs, rubs, gallops RESPIRATORY: Clear to auscultation without rales, wheezing or rhonchi  ABDOMEN: Soft, non-tender, non-distended MUSCULOSKELETAL: No edema, no deformity  SKIN: Warm and dry NEUROLOGIC:  Alert and oriented x 3 PSYCHIATRIC:  Normal affect   Assessment & Plan: ***    {Are you ordering a CV Procedure (e.g. stress test, cath, DCCV, TEE, etc)?   Press F2        :[id number]}   Dispo: ***  Signed, Saddie GORMAN Cleaves, NP 04/06/2024 8:23 AM [facility] "

## 2024-04-05 NOTE — H&P (Signed)
 " History and Physical  [name] FMW:[id number] DOB: [birth year] DOA: 04/05/2024  PCP: Wendolyn Jenkins Jansky, MD   Chief Complaint: Leg swelling  HPI: [name] is a 89 y.o. female with medical history significant for asthma, chronic hypoxia on 2 L, PMR on chronic prednisone , CKD stage IIIa, recent hospitalization for newly diagnosed heart failure with reduced EF now being admitted to the hospital with weight gain and lower extremity edema likely due to acute on chronic heart failure with reduced EF and suspicion of recurrent pneumonia.  History is provided mainly by the patient's daughter who is at the bedside, and is her primary caregiver.  She was discharged from [facility] on 1/12 after stay for acute on chronic hypoxic respiratory failure, treated for acute systolic CHF as well as LLL consolidation.  She completed a course of IV azithromycin  and IV Rocephin  during that hospitalization, and was successfully diuresed with IV Lasix .  She was seen by cardiology and started on GDMT as below.  Per discharge summary on the day of discharge, Lasix  was held on that day due to slight bump in creatinine.  Patient's daughter states she has been taking 1/2 tablet of Lasix  at home.  Overall she has been doing well, seem to get better from a respiratory standpoint by the time of discharge, however over the last few days she has once again developed lower extremity edema, and a very wet sounding nonproductive cough.  She remains on her baseline 2 L nasal cannula oxygen .  Review of Systems: Please see HPI for pertinent positives and negatives. A complete 10 system review of systems are otherwise negative.  Past Medical History:  Diagnosis Date   Abdominal bloating    Abnormal LFTs (liver function tests)    Ankle fracture, bimalleolar, closed, right, initial encounter    Anxiety disorder    Arthritis    Asthma    Bimalleolar ankle fracture, right, closed, initial encounter 01/06/2019   Depression    Diverticulosis     Edema    Fatty liver    Fibromyalgia    GERD (gastroesophageal reflux disease)    Giant cell arteritis (HCC)    No biopsy secondary to steroid use   Hx of adenomatous colonic polyps    Hyperlipidemia    Hypertension    IBS (irritable bowel syndrome)    Lumbar radiculitis    Osteoporosis 09/2017   T score -2.6 left femoral neck   Peptic ulcer disease    Skin cancer    shoulder, leg, right eye (? squamous by resection)   Thoracic radiculitis    Thyroid  nodule    removed   Urinary incontinence    Vertigo    Past Surgical History:  Procedure Laterality Date   ABDOMINAL HYSTERECTOMY     BSO   BACK SURGERY  2005   lower back   EYE SURGERY Right 2006   LIVER BIOPSY     fatty liver   NASAL SEPTUM SURGERY     ORIF ANKLE FRACTURE Right 01/11/2019   Procedure: OPEN REDUCTION INTERNAL FIXATION (ORIF) RIGHT BIMALLEOLAR ANKLE FRACTURE;  Surgeon: Jerri Kay HERO, MD;  Location: [facility];  Service: Orthopedics;  Laterality: Right;   right knee surgery  2006   arthroscopy   right leg skin cancer surgery  2014   right shoulder skin cancer excision  2012   SKIN CANCER EXCISION     THYROIDECTOMY     Social History:  reports that she has never smoked. She has never  used smokeless tobacco. She reports that she does not drink alcohol  and does not use drugs.  Allergies[1]  Family History  Problem Relation Age of Onset   Colon polyps Maternal Aunt    Diabetes Father    Heart disease Father    Heart disease Mother    Lung disease Mother    COPD Mother    Lung cancer Brother    Cancer Brother        lung   COPD Brother    Stroke Brother    Lung cancer Sister    COPD Sister    Kidney disease Other        niece-mat side   Arthritis Other    Diabetes Son    Arthritis Daughter    Colon cancer Neg Hx      Prior to Admission medications  Medication Sig Start Date End Date Taking? Authorizing Provider  acetaminophen  (TYLENOL ) 500 MG tablet Take 1 tablet (500  mg total) by mouth 3 (three) times daily. 01/03/24   Regalado, Belkys A, MD  albuterol  (PROVENTIL ) (2.5 MG/3ML) 0.083% nebulizer solution Take 3 mLs (2.5 mg total) by nebulization every 6 (six) hours as needed for wheezing or shortness of breath. 04/05/24   Wendolyn Jenkins Jansky, MD  ARIPiprazole  (ABILIFY ) 2 MG tablet TAKE 1 TABLET BY MOUTH EVERY DAY *NEW PRESCRIPTION REQUEST* 01/07/24   Wendolyn Jenkins Jansky, MD  ASPIRIN  LOW DOSE 81 MG tablet TAKE 1 TABLET BY MOUTH EVERY DAY *NEW PRESCRIPTION REQUEST* 01/07/24   Wendolyn Jenkins Jansky, MD  budesonide  (PULMICORT ) 1 MG/2ML nebulizer solution INHALE THE CONTENTS OF 1 VIAL VIA NEBULIZER TWICE DAILY *NEW PRESCRIPTION REQUEST* 01/07/24   Wendolyn Jenkins Jansky, MD  buPROPion  (WELLBUTRIN  XL) 150 MG 24 hr tablet Take 1 tablet (150 mg total) by mouth in the morning. 01/07/24   Wendolyn Jenkins Jansky, MD  calcium -vitamin D  (OSCAL WITH D) 500-200 MG-UNIT tablet Take 1 tablet by mouth 3 (three) times daily. 01/11/19   Jerri Kay HERO, MD  carvedilol  (COREG ) 6.25 MG tablet Take 1 tablet (6.25 mg total) by mouth 2 (two) times daily with a meal. 03/29/24 04/28/24  Christobal Guadalajara, MD  cephALEXin  (KEFLEX ) 250 MG capsule Take 1 capsule (250 mg total) by mouth daily. Resume after finished with antibiotics from hospitalization 02/28/24   Tat, Alm, MD  Cholecalciferol  (VITAMIN D ) 50 MCG (2000 UT) tablet Take 2,000 Units by mouth daily. 03/02/24   [provider]  cyanocobalamin  (VITAMIN B12) 1000 MCG/ML injection Inject 1 mL (1,000 mcg total) into the muscle every 30 (thirty) days. 01/07/24   Wendolyn Jenkins Jansky, MD  diazepam  (VALIUM ) 5 MG tablet Take 1 tablet (5 mg total) by mouth See admin instructions. Take 5 mg by mouth in the evening AS NEEDED for restlessness and an additional 5 mg once a day as needed for anxiety 01/13/24   Wendolyn Jenkins Jansky, MD  docusate sodium  (COLACE) 100 MG capsule Take 1 capsule (100 mg total) by mouth daily. 01/07/24   Wendolyn Jenkins Jansky, MD  estradiol (ESTRACE) 0.1 MG/GM  vaginal cream Place 1 Applicatorful vaginally daily as needed (burning). 03/30/21   [provider]  ferrous sulfate  325 (65 FE) MG tablet Take 1 tablet (325 mg total) by mouth daily with breakfast. 03/30/24 04/29/24  Christobal Guadalajara, MD  folic acid  (FOLVITE ) 1 MG tablet Take 1 tablet (1 mg total) by mouth daily. 03/03/24   Wendolyn Jenkins Jansky, MD  guaiFENesin  (MUCINEX ) 600 MG 12 hr tablet Take 600 mg by mouth 2 (  two) times daily.    [provider]  HYDROcodone -acetaminophen  (NORCO/VICODIN) 5-325 MG tablet Take 1 tablet by mouth every 6 (six) hours as needed for moderate pain (pain score 4-6). 03/03/24   Wendolyn Jenkins Jansky, MD  memantine  (NAMENDA ) 5 MG tablet Take 1 tablet (5 mg total) by mouth in the morning and at bedtime. 01/07/24   Wendolyn Jenkins Jansky, MD  omega-3 acid ethyl esters (LOVAZA ) 1 g capsule Take 2 capsules (2 g total) by mouth 2 (two) times daily. 01/22/24   Wendolyn Jenkins Jansky, MD  omeprazole  (PRILOSEC) 40 MG capsule TAKE ONE (1) CAPSULE BY MOUTH TWICE DAILY *NEW PRESCRIPTION REQUEST* 01/07/24   Wendolyn Jenkins Jansky, MD  ondansetron  (ZOFRAN -ODT) 8 MG disintegrating tablet Take 1 tablet (8 mg total) by mouth every 8 (eight) hours as needed for nausea or vomiting. PLACE 1 TABLET ON TONGUE EVERY 8 HOURS AS NEEDED FOR NAUSEA OR VOMITING 01/13/24   Wendolyn Jenkins Jansky, MD  polyethylene glycol (MIRALAX  / GLYCOLAX ) 17 g packet Take 17 g by mouth daily. 01/13/24   Wendolyn Jenkins Jansky, MD  predniSONE  (DELTASONE ) 10 MG tablet Take 3 tabs daily x 2 days, then 2 tabs daily x 2 days,then 1 tab daily x 2 days then resume home the dose 03/30/24   Christobal Guadalajara, MD  predniSONE  (DELTASONE ) 5 MG tablet Take 1 tablet (5 mg total) by mouth at bedtime. Resume after finished with prednisone  taper from hospitalization 02/28/24   Evonnie Lenis, MD  Probiotic CHEW Chew 1 each by mouth every other day. 01/07/24   Wendolyn Jenkins Jansky, MD  rOPINIRole  (REQUIP ) 1 MG tablet Take 1 tablet (1 mg total) by mouth as directed. TAKE 1 TABLET  IN THE MORNING AND 2 TABLETS IN THE EVENING 01/22/24   Wendolyn Jenkins Jansky, MD  sacubitril -valsartan  (ENTRESTO ) 24-26 MG Take 1 tablet by mouth 2 (two) times daily. 03/29/24 04/28/24  Christobal Guadalajara, MD  sertraline  (ZOLOFT ) 50 MG tablet Take 1 tablet (50 mg total) by mouth in the morning. 01/07/24   Wendolyn Jenkins Jansky, MD  spironolactone  (ALDACTONE ) 25 MG tablet Take 0.5 tablets (12.5 mg total) by mouth daily. 03/30/24 04/29/24  Christobal Guadalajara, MD  SYSTANE 0.4-0.3 % SOLN Place 1 drop into both eyes 4 (four) times daily as needed (for dryness). 01/13/24   Wendolyn Jenkins Jansky, MD  Tiotropium Bromide  (SPIRIVA  RESPIMAT) 2.5 MCG/ACT AERS Inhale 2 Inhalations into the lungs daily at 12 noon. 01/22/24   Wendolyn Jenkins Jansky, MD  tocilizumab  4 mg/kg in sodium chloride  0.9 % Inject 280 mg into the vein every 28 (twenty-eight) days. 12/03/16   [provider]    Physical Exam: BP 139/75   Pulse 61   Temp 97.7 F (36.5 C)   Resp (!) 22   Ht 5' 3 (1.6 m)   Wt 73.9 kg   SpO2 98%   BMI 28.87 kg/m  General:  Alert, oriented, calm, in no acute distress, resting comfortably on her baseline 2 L oxygen .  She has an intermittent very wet sounding cough. Cardiovascular: RRR, no murmurs or rubs, 2+ pitting bilateral lower extremity edema up to the bilateral knees Respiratory: clear to auscultation bilaterally, but with diffuse wet sounding crackles, no wheezing, tachypnea or signs of respiratory distress Abdomen: soft, nontender, nondistended, normal bowel tones heard  Skin: dry, no rashes  Musculoskeletal: no joint effusions, normal range of motion  Psychiatric: appropriate affect, normal speech  Neurologic: extraocular muscles intact, clear speech, moving all extremities with intact sensorium  Labs on Admission:  Basic Metabolic Panel: Recent Labs  Lab 04/05/24 1608  NA 141  K 5.3*  CL 102  CO2 31  GLUCOSE 143*  BUN 27*  CREATININE 0.92  CALCIUM  9.2   Liver Function Tests: No results for input(s):  AST, ALT, ALKPHOS, BILITOT, PROT, ALBUMIN in the last 168 hours. No results for input(s): LIPASE, AMYLASE in the last 168 hours. No results for input(s): AMMONIA in the last 168 hours. CBC: Recent Labs  Lab 04/05/24 1608  WBC 14.5*  NEUTROABS 12.0*  HGB 10.3*  HCT 34.7*  MCV 96.9  PLT 262   Cardiac Enzymes: No results for input(s): CKTOTAL, CKMB, CKMBINDEX, TROPONINI in the last 168 hours. BNP (last 3 results) Recent Labs    06/10/23 1315  BNP 292.4*    ProBNP (last 3 results) Recent Labs    03/22/24 1609 04/05/24 1608  PROBNP 23,103.0* >35,000.0*    CBG: No results for input(s): GLUCAP in the last 168 hours.  Radiological Exams on Admission: DG Chest Portable 1 View Result Date: 04/05/2024 EXAM: 1 VIEW(S) XRAY OF THE CHEST 04/05/2024 03:54:00 PM COMPARISON: 03/26/2024 CLINICAL HISTORY: hf hf hf FINDINGS: LUNGS AND PLEURA: Left basilar airspace opacity with obscuration of the diaphragmatic leaflet. Improving atelectasis or pneumonia at the right lung base. Small left pleural effusion. No pneumothorax. HEART AND MEDIASTINUM: Heart size at upper limits. Aortic atherosclerosis. BONES AND SOFT TISSUES: No acute osseous abnormality. IMPRESSION: 1. Left basilar airspace opacity with obscuration of the diaphragmatic leaflet. 2. Improving atelectasis or pneumonia at the right lung base. 3. Small left pleural effusion. Electronically signed by: Greig Pique MD 04/05/2024 04:26 PM EST RP Workstation: [id number]   Assessment/Plan [name] is a 89 y.o. female with medical history significant for asthma, chronic hypoxia on 2 L, PMR on chronic prednisone , CKD stage IIIa, recent hospitalization for newly diagnosed heart failure with reduced EF now being admitted to the hospital with weight gain and lower extremity edema likely due to acute on chronic heart failure with reduced EF and suspicion of recurrent pneumonia.    Acute on chronic systolic congestive  heart failure-patient was recently diagnosed with heart failure with reduced EF, was started on goal-directed medical therapy and discharged on low-dose Lasix , 20 mg daily as far as I can tell as the patient's daughter states she takes half a tablet.  Likely developed recurrent heart failure as she was discharged on a low-dose of Lasix  due to concerns for her acute on CKD.  Presents with continued effusion, rising BNP and peripheral edema. -Inpatient admission -Monitor closely on telemetry -Heart healthy diet -Received IV Lasix  x 1 in the emergency department, will redosed tomorrow pending renal function and clinical response -Continue Coreg , Entresto , Aldactone   Community-acquired pneumonia-seems to have clinically resolved after treatment with IV azithromycin  and IV Rocephin  during her last hospitalization, she now has recurrent cough.  She is on her baseline oxygen .  No fevers, no sepsis criteria.  However she does have left basilar opacity, and a rising white count. -Treat empirically with broad-spectrum IV vancomycin  and IV cefepime   Chronic hypoxic respiratory failure-continue baseline 2 L nasal cannula oxygen   CKD stage IIIa-renal function appears to be slightly improved from her last hospitalization, will need to monitor this closely in the setting of diuresis  Dementia/anxiety/depression/RLS-continue home Abilify , Wellbutrin , sertraline , Requip , Namenda   Iron deficiency anemia-continue daily iron supplementation  Giant cell arteritis/PMR-continue home prednisone  5 mg daily  DVT prophylaxis: Subcutaneous heparin     Code Status: Limited: Do not attempt  resuscitation (DNR) -DNR-LIMITED -Do Not Intubate/DNI   Consults called: None  Admission status: The appropriate patient status for this patient is INPATIENT. Inpatient status is judged to be reasonable and necessary in order to provide the required intensity of service to ensure the patient's safety. The patient's presenting symptoms,  physical exam findings, and initial radiographic and laboratory data in the context of their chronic comorbidities is felt to place them at high risk for further clinical deterioration. Furthermore, it is not anticipated that the patient will be medically stable for discharge from the hospital within 2 midnights of admission.    I certify that at the point of admission it is my clinical judgment that the patient will require inpatient hospital care spanning beyond 2 midnights from the point of admission due to high intensity of service, high risk for further deterioration and high frequency of surveillance required  Time spent: 53 minutes  Ayianna Darnold CHRISTELLA Gail MD [facility] Pager [phone]  If 7PM-7AM, please contact night-coverage [email or web address] Password [email or web address]  04/05/2024, 5:34 PM      [1]  Allergies Allergen Reactions   Oxycodone  Itching and Other (See Comments)    Hallucinations   Statins Other (See Comments)    Myalgia    Advair Diskus [Fluticasone -Salmeterol] Other (See Comments)    Mouth sores   Atorvastatin  Other (See Comments)    Severe headache   Sulfonamide Derivatives Rash   "

## 2024-04-06 ENCOUNTER — Ambulatory Visit: Admitting: Orthopedic Surgery

## 2024-04-06 ENCOUNTER — Other Ambulatory Visit (HOSPITAL_COMMUNITY): Payer: Self-pay

## 2024-04-06 ENCOUNTER — Encounter: Payer: Self-pay | Admitting: Cardiology

## 2024-04-06 ENCOUNTER — Ambulatory Visit: Admitting: Physician Assistant

## 2024-04-06 LAB — CBC
HCT: 32.1 % — ABNORMAL LOW (ref 36.0–46.0)
Hemoglobin: 9.7 g/dL — ABNORMAL LOW (ref 12.0–15.0)
MCH: 29.3 pg (ref 26.0–34.0)
MCHC: 30.2 g/dL (ref 30.0–36.0)
MCV: 97 fL (ref 80.0–100.0)
Platelets: 255 K/uL (ref 150–400)
RBC: 3.31 MIL/uL — ABNORMAL LOW (ref 3.87–5.11)
RDW: 15.6 % — ABNORMAL HIGH (ref 11.5–15.5)
WBC: 14 K/uL — ABNORMAL HIGH (ref 4.0–10.5)
nRBC: 0 % (ref 0.0–0.2)

## 2024-04-06 LAB — BLOOD CULTURE ID PANEL (REFLEXED) - BCID2

## 2024-04-06 LAB — BASIC METABOLIC PANEL WITH GFR
Anion gap: 6 (ref 5–15)
BUN: 25 mg/dL — ABNORMAL HIGH (ref 8–23)
CO2: 33 mmol/L — ABNORMAL HIGH (ref 22–32)
Calcium: 9.3 mg/dL (ref 8.9–10.3)
Chloride: 103 mmol/L (ref 98–111)
Creatinine, Ser: 0.95 mg/dL (ref 0.44–1.00)
GFR, Estimated: 56 mL/min — ABNORMAL LOW
Glucose, Bld: 100 mg/dL — ABNORMAL HIGH (ref 70–99)
Potassium: 4.8 mmol/L (ref 3.5–5.1)
Sodium: 142 mmol/L (ref 135–145)

## 2024-04-06 LAB — PRO BRAIN NATRIURETIC PEPTIDE: Pro Brain Natriuretic Peptide: >35000 pg/mL — ABNORMAL HIGH

## 2024-04-06 MED ORDER — ORAL CARE MOUTH RINSE
15.0000 mL | OROMUCOSAL | Status: DC
  Administered 2024-04-06 – 2024-04-09 (×8): 15 mL via OROMUCOSAL

## 2024-04-06 MED ORDER — SODIUM CHLORIDE 0.9 % IV SOLN
INTRAVENOUS | Status: AC | PRN
  Administered 2024-04-06: 10 mL via INTRAVENOUS

## 2024-04-06 MED ORDER — ORAL CARE MOUTH RINSE: 15.0000 mL | OROMUCOSAL | Status: DC | PRN

## 2024-04-06 NOTE — Progress Notes (Signed)
 PHARMACY - PHYSICIAN COMMUNICATION CRITICAL VALUE ALERT - BLOOD CULTURE IDENTIFICATION (BCID)  [name] is an 89 y.o. female who presented to [facility] on 04/05/2024 with a chief complaint of bilateral leg swelling, SOB, facial swelling. Pt started on broad spectrum antibiotics for pneumonia.   Assessment:  BCID + 1/4 GPC, identified as staph epi, mecA detected  Name of physician (or Provider) Contacted: A. Alto, NP  Current antibiotics: Cefepime  + vancomycin   Changes to prescribed antibiotics recommended: Could represent contaminant, but patient already on vancomycin . Continue current antibiotics pending attending MD assessment on 1/21.   Results for orders placed or performed during the hospital encounter of 04/05/24  Blood Culture ID Panel (Reflexed) (Collected: 04/05/2024  5:58 PM)  Result Value Ref Range   Enterococcus faecalis NOT DETECTED NOT DETECTED   Enterococcus Faecium NOT DETECTED NOT DETECTED   Listeria monocytogenes NOT DETECTED NOT DETECTED   Staphylococcus species DETECTED (A) NOT DETECTED   Staphylococcus aureus (BCID) NOT DETECTED NOT DETECTED   Staphylococcus epidermidis DETECTED (A) NOT DETECTED   Staphylococcus lugdunensis NOT DETECTED NOT DETECTED   Streptococcus species NOT DETECTED NOT DETECTED   Streptococcus agalactiae NOT DETECTED NOT DETECTED   Streptococcus pneumoniae NOT DETECTED NOT DETECTED   Streptococcus pyogenes NOT DETECTED NOT DETECTED   A.calcoaceticus-baumannii NOT DETECTED NOT DETECTED   Bacteroides fragilis NOT DETECTED NOT DETECTED   Enterobacterales NOT DETECTED NOT DETECTED   Enterobacter cloacae complex NOT DETECTED NOT DETECTED   Escherichia coli NOT DETECTED NOT DETECTED   Klebsiella aerogenes NOT DETECTED NOT DETECTED   Klebsiella oxytoca NOT DETECTED NOT DETECTED   Klebsiella pneumoniae NOT DETECTED NOT DETECTED   Proteus species NOT DETECTED NOT DETECTED   Salmonella species NOT DETECTED NOT DETECTED   Serratia marcescens  NOT DETECTED NOT DETECTED   Haemophilus influenzae NOT DETECTED NOT DETECTED   Neisseria meningitidis NOT DETECTED NOT DETECTED   Pseudomonas aeruginosa NOT DETECTED NOT DETECTED   Stenotrophomonas maltophilia NOT DETECTED NOT DETECTED   Candida albicans NOT DETECTED NOT DETECTED   Candida auris NOT DETECTED NOT DETECTED   Candida glabrata NOT DETECTED NOT DETECTED   Candida krusei NOT DETECTED NOT DETECTED   Candida parapsilosis NOT DETECTED NOT DETECTED   Candida tropicalis NOT DETECTED NOT DETECTED   Cryptococcus neoformans/gattii NOT DETECTED NOT DETECTED   Methicillin resistance mecA/C DETECTED (A) NOT DETECTED    Ronal CHRISTELLA Rav, PharmD 04/06/2024  7:10 PM

## 2024-04-06 NOTE — Progress Notes (Signed)
 " [facility] Progress Note  Patient: [name]     FMW:[id number]  DOA: 04/05/2024   PCP: Wendolyn Jenkins Jansky, MD       Brief hospital course: Analyse Bernhart is a 89 y.o. female with medical history significant for asthma, chronic hypoxia on 2 L, PMR on chronic prednisone , CKD stage IIIa, recent hospitalization for newly diagnosed heart failure with reduced EF now being admitted to the hospital with weight gain and lower extremity edema likely due to acute on chronic heart failure with reduced EF and suspicion of recurrent pneumonia.  History is provided mainly by the patient's daughter who is at the bedside, and is her primary caregiver.  She was discharged from [facility] on 1/12 after stay for acute on chronic hypoxic respiratory failure, treated for acute systolic CHF as well as LLL consolidation.  She completed a course of IV azithromycin  and IV Rocephin  during that hospitalization, and was successfully diuresed with IV Lasix .  She was seen by cardiology and started on GDMT as below.  Per discharge summary on the day of discharge, Lasix  was held on that day due to slight bump in creatinine.  Patient's daughter states she has been taking 1/2 tablet of Lasix  at home.  Overall she has been doing well, seem to get better from a respiratory standpoint by the time of discharge, however over the last few days she has once again developed lower extremity edema, a very wet sounding nonproductive cough and dyspnea.  She remains on her baseline 2 L nasal cannula oxygen .  IN ED - RR as high as 3e- mostly in 20s pBNP > 35,000  Subjective:  Per daughter, the patient has been exteremly somnolent since being started on the 3 new heart medications. She continued to be mostly somnolent at home. The patient is not able to awaken to speak with me.   Assessment and Plan: Principal Problem: Cough, leukocytosis, pedal edema and dyspnea - Lasix  x 1 given - pedal edema significantly improved- cont Entresto ,  Spironolactone  - was previously discharged on Keflex  after receiving Ceftriaxone  and Azithromycin  in the hospital - currently on Cefepime - WBC count had previously improved to 11.9 and was 14.5 when readmitted- now 14.0  Lethargy - ? If due to deconditioning - hold Coreg  for now and follow  CKD 3a - Cr stable at 0.95 - follow  Dementia, anxiety/depression, RLS - cont Abilify , Wellbutrin , Sertraline , Requip  and Namenda    Giant cell arteritis/PMR - cont Prednisone  5 mg  IDA - orla Iron    Code Status: Limited: Do not attempt resuscitation (DNR) -DNR-LIMITED -Do Not Intubate/DNI  Total time on patient care: 35 min DVT prophylaxis:  heparin  injection 5,000 Units Start: 04/05/24 2200     Objective:   Vitals:   04/06/24 0915 04/06/24 1049 04/06/24 1215 04/06/24 1436  BP: (!) 149/43  (!) 149/44 (!) 124/42  Pulse: 65  (!) 59 (!) 51  Resp: (!) 27  (!) 22 (!) 22  Temp:  98.5 F (36.9 C)  98.4 F (36.9 C)  TempSrc:  Oral  Oral  SpO2: 99%  100% 100%  Weight:      Height:       Filed Weights   04/05/24 1451  Weight: 73.9 kg   Exam: General exam: Appears comfortable - somnolent- laying flat HEENT: oral mucosa moist Respiratory system: Clear to auscultation.  Cardiovascular system: S1 & S2 heard  Gastrointestinal system: Abdomen soft, non-tender, nondistended. Normal bowel sounds   Extremities: No cyanosis, clubbing or edema  CBC: Recent Labs  Lab 04/05/24 1608 04/06/24 0456  WBC 14.5* 14.0*  NEUTROABS 12.0*  --   HGB 10.3* 9.7*  HCT 34.7* 32.1*  MCV 96.9 97.0  PLT 262 255   Basic Metabolic Panel: Recent Labs  Lab 04/05/24 1608 04/06/24 0456  NA 141 142  K 5.3* 4.8  CL 102 103  CO2 31 33*  GLUCOSE 143* 100*  BUN 27* 25*  CREATININE 0.92 0.95  CALCIUM  9.2 9.3     Scheduled Meds:  ARIPiprazole   2 mg Oral Daily   budesonide   1 mg Nebulization BID   buPROPion   150 mg Oral Daily   docusate sodium   100 mg Oral QHS   ferrous sulfate   325 mg  Oral Q breakfast   folic acid   1 mg Oral Daily   guaiFENesin   600 mg Oral BID   heparin  injection (subcutaneous)  5,000 Units Subcutaneous Q8H   memantine   5 mg Oral BID   pantoprazole   40 mg Oral Daily   polyethylene glycol  17 g Oral Daily   predniSONE   5 mg Oral Q breakfast   rOPINIRole   1 mg Oral Daily   And   rOPINIRole   2 mg Oral QHS   sacubitril -valsartan   1 tablet Oral BID   sertraline   50 mg Oral Daily   spironolactone   12.5 mg Oral Daily   umeclidinium bromide   1 puff Inhalation Daily    Imaging and lab data personally reviewed   Author: Darlette Dubow  04/06/2024 3:12 PM  To contact [facility]>   Check the care team in [facility] and look for the attending/consulting [facility] provider listed  Log into [email or web address] and use [place]'s universal password   Go to> [facility]  and find provider  If you still have difficulty reaching the provider, please page the [facility] (Director on Call) for the Hospitalists listed on amion     "

## 2024-04-06 NOTE — ED Notes (Signed)
 Emptied cannister; 500 ml of urine.

## 2024-04-06 NOTE — ED Notes (Signed)
 RN notified of pt's BP readings.

## 2024-04-06 NOTE — Telephone Encounter (Signed)
 Addressed over the phone with scheduling

## 2024-04-06 NOTE — Progress Notes (Signed)
Heart Failure Navigator Progress Note  Assessed for Heart & Vascular TOC clinic readiness.  Patient does not meet criteria due to PMH of dementia.   Navigator available for reassessment of patient.   Sharen Hones, PharmD, BCPS Heart Failure Stewardship Pharmacist Phone [phone]

## 2024-04-07 DIAGNOSIS — J189 Pneumonia, unspecified organism: Secondary | ICD-10-CM | POA: Diagnosis not present

## 2024-04-07 LAB — CBC
HCT: 29.3 % — ABNORMAL LOW (ref 36.0–46.0)
Hemoglobin: 8.6 g/dL — ABNORMAL LOW (ref 12.0–15.0)
MCH: 29 pg (ref 26.0–34.0)
MCHC: 29.4 g/dL — ABNORMAL LOW (ref 30.0–36.0)
MCV: 98.7 fL (ref 80.0–100.0)
Platelets: 194 K/uL (ref 150–400)
RBC: 2.97 MIL/uL — ABNORMAL LOW (ref 3.87–5.11)
RDW: 15.5 % (ref 11.5–15.5)
WBC: 10.9 K/uL — ABNORMAL HIGH (ref 4.0–10.5)
nRBC: 0 % (ref 0.0–0.2)

## 2024-04-07 LAB — BASIC METABOLIC PANEL WITH GFR
Anion gap: 4 — ABNORMAL LOW (ref 5–15)
BUN: 24 mg/dL — ABNORMAL HIGH (ref 8–23)
CO2: 34 mmol/L — ABNORMAL HIGH (ref 22–32)
Calcium: 9.3 mg/dL (ref 8.9–10.3)
Chloride: 107 mmol/L (ref 98–111)
Creatinine, Ser: 0.82 mg/dL (ref 0.44–1.00)
GFR, Estimated: >60 mL/min
Glucose, Bld: 85 mg/dL (ref 70–99)
Potassium: 4.5 mmol/L (ref 3.5–5.1)
Sodium: 144 mmol/L (ref 135–145)

## 2024-04-07 NOTE — Plan of Care (Signed)
  Problem: Nutrition: Goal: Adequate nutrition will be maintained Outcome: Progressing   Problem: Coping: Goal: Level of anxiety will decrease Outcome: Progressing   Problem: Skin Integrity: Goal: Risk for impaired skin integrity will decrease Outcome: Progressing   

## 2024-04-07 NOTE — Progress Notes (Signed)
 pT REFUSED HER BREATHING TX AND REFUSED TO DRINK WATER FOR THE NURSE. pT WOULD JUST SHAKE HER HEAD NO. tHE pT DID TELL ME BY.

## 2024-04-07 NOTE — Progress Notes (Signed)
 " PROGRESS NOTE    [name]  FMW:[id number] DOB: June 07, 1929 DOA: 04/05/2024 PCP: Wendolyn Jenkins Jansky, MD     Brief Narrative:  [name] is a 89 y.o. female with medical history significant for asthma, chronic hypoxia on 2 L, PMR on chronic prednisone , CKD stage IIIa, recent hospitalization for newly diagnosed heart failure with reduced EF now being admitted to the hospital with weight gain and lower extremity edema likely due to acute on chronic heart failure with reduced EF and suspicion of recurrent pneumonia.  History is provided mainly by the patient's daughter who is at the bedside, and is her primary caregiver.  She was discharged from [facility] on 1/12 after stay for acute on chronic hypoxic respiratory failure, treated for acute systolic CHF as well as LLL consolidation.  She completed a course of IV azithromycin  and IV Rocephin  during that hospitalization, and was successfully diuresed with IV Lasix .  She was seen by cardiology and started on GDMT.  Per discharge summary on the day of discharge, Lasix  was held on that day due to slight bump in creatinine.  Patient's daughter states she has been taking 1/2 tablet of Lasix  at home.  Overall she has been doing well, seem to get better from a respiratory standpoint by the time of discharge, however over the last few days she has once again developed lower extremity edema, a very wet sounding nonproductive cough and dyspnea.  She remains on her baseline 2 L nasal cannula oxygen .   New events last 24 hours / Subjective: Patient  is hard of hearing, but was easily arousable this morning. Did not want to eat breakfast.  She was oriented to self, place.  Was not aware that she had a pneumonia.  Was upset that I woke her up from sleeping.  Assessment & Plan:   Principal Problem:   HCAP (healthcare-associated pneumonia) Active Problems:   Polymyalgia rheumatica   Giant cell arteritis (HCC)   RLS (restless legs syndrome)   Acute systolic (congestive)  heart failure (HCC)   HCAP - Cefepime  - Blood culture with single set showing Staph epidermidis, likely to be a contaminant  Acute on chronic systolic CHF  - Entresto , Aldactone , Coreg  (Coreg  on hold currently)  Chronic hypoxic respiratory failure - On 2 L nasal cannula O2 at baseline  CKD 3a - Stable  Dementia - Abilify , Wellbutrin , sertraline , Requip , Namenda   Giant cell arteritis/PMR - Prednisone   Liver mass/circumferential wall thickening transverse colon - Had previously deferred further workup, did not want to pursue any oncologic treatment   DVT prophylaxis:  heparin  injection 5,000 Units Start: 04/05/24 2200  Code Status: DNR Family Communication: None at bedside Disposition Plan: Home with home health Status is: Inpatient Remains inpatient appropriate because: IV antibiotics    Antimicrobials:  Anti-infectives (From admission, onward)    Start     Dose/Rate Route Frequency Ordered Stop   04/06/24 1800  vancomycin  (VANCOREADY) IVPB 750 mg/150 mL  Status:  Discontinued        750 mg 150 mL/hr over 60 Minutes Intravenous Every 24 hours 04/05/24 1713 04/07/24 1315   04/06/24 0600  ceFEPIme  (MAXIPIME ) 2 g in sodium chloride  0.9 % 100 mL IVPB        2 g 200 mL/hr over 30 Minutes Intravenous Every 12 hours 04/05/24 1713     04/05/24 1715  vancomycin  (VANCOREADY) IVPB 1500 mg/300 mL        1,500 mg 150 mL/hr over 120 Minutes Intravenous  Once 04/05/24 1713  04/05/24 2040   04/05/24 1645  vancomycin  (VANCOCIN ) IVPB 1000 mg/200 mL premix  Status:  Discontinued        1,000 mg 200 mL/hr over 60 Minutes Intravenous  Once 04/05/24 1644 04/05/24 1711   04/05/24 1645  ceFEPIme  (MAXIPIME ) 2 g in sodium chloride  0.9 % 100 mL IVPB        2 g 200 mL/hr over 30 Minutes Intravenous  Once 04/05/24 1644 04/05/24 1827        Objective: Vitals:   04/07/24 0314 04/07/24 0810 04/07/24 0821 04/07/24 1310  BP: (!) 148/46  (!) 150/43 (!) 126/42  Pulse: (!) 58  (!) 59 63   Resp: 18 (!) 21 16 16   Temp: 98.2 F (36.8 C)  97.8 F (36.6 C) 100.2 F (37.9 C)  TempSrc:   Oral Oral  SpO2: 97% 98% 100% 98%  Weight:      Height:        Intake/Output Summary (Last 24 hours) at 04/07/2024 1322 Last data filed at 04/07/2024 1222 Gross per 24 hour  Intake 621.62 ml  Output 200 ml  Net 421.62 ml   Filed Weights   04/05/24 1451  Weight: 73.9 kg    Examination:  General exam: Appears calm and comfortable  Respiratory system: Clear to auscultation anteriorly, no resp distress  Cardiovascular system: S1 & S2 heard, RRR. No murmurs. Minimal nonpitting pedal edema. Gastrointestinal system: Abdomen is nondistended, soft and nontender. Normal bowel sounds heard. Central nervous system: Alert and oriented to self, place  Extremities: Symmetric in appearance   Data Reviewed: I have personally reviewed following labs and imaging studies  CBC: Recent Labs  Lab 04/05/24 1608 04/06/24 0456 04/07/24 0411  WBC 14.5* 14.0* 10.9*  NEUTROABS 12.0*  --   --   HGB 10.3* 9.7* 8.6*  HCT 34.7* 32.1* 29.3*  MCV 96.9 97.0 98.7  PLT 262 255 194   Basic Metabolic Panel: Recent Labs  Lab 04/05/24 1608 04/06/24 0456 04/07/24 0411  NA 141 142 144  K 5.3* 4.8 4.5  CL 102 103 107  CO2 31 33* 34*  GLUCOSE 143* 100* 85  BUN 27* 25* 24*  CREATININE 0.92 0.95 0.82  CALCIUM  9.2 9.3 9.3   GFR: Estimated Creatinine Clearance: 42.2 mL/min (by C-G formula based on SCr of 0.82 mg/dL). Liver Function Tests: No results for input(s): AST, ALT, ALKPHOS, BILITOT, PROT, ALBUMIN in the last 168 hours. No results for input(s): LIPASE, AMYLASE in the last 168 hours. No results for input(s): AMMONIA in the last 168 hours. Coagulation Profile: No results for input(s): INR, PROTIME in the last 168 hours. Cardiac Enzymes: No results for input(s): CKTOTAL, CKMB, CKMBINDEX, TROPONINI in the last 168 hours. BNP (last 3 results) Recent Labs     03/22/24 1609 04/05/24 1608 04/06/24 0456  PROBNP 23,103.0* >35,000.0* >35,000.0*   HbA1C: No results for input(s): HGBA1C in the last 72 hours. CBG: No results for input(s): GLUCAP in the last 168 hours. Lipid Profile: No results for input(s): CHOL, HDL, LDLCALC, TRIG, CHOLHDL, LDLDIRECT in the last 72 hours. Thyroid  Function Tests: No results for input(s): TSH, T4TOTAL, FREET4, T3FREE, THYROIDAB in the last 72 hours. Anemia Panel: No results for input(s): VITAMINB12, FOLATE, FERRITIN, TIBC, IRON, RETICCTPCT in the last 72 hours. Sepsis Labs: Recent Labs  Lab 04/05/24 1753  LATICACIDVEN 0.7    Recent Results (from the past 240 hours)  Culture, blood (routine x 2)     Status: Abnormal (Preliminary result)   Collection Time: 04/05/24  5:58 PM   Specimen: BLOOD LEFT HAND  Result Value Ref Range Status   Specimen Description   Final    BLOOD LEFT HAND Performed at [facility], [address], [place], [place] [address]    Special Requests   Final    BOTTLES DRAWN AEROBIC AND ANAEROBIC Blood Culture adequate volume Performed at [facility], [address] [place], [place] [address]    Culture  Setup Time   Final    GRAM POSITIVE COCCI ANAEROBIC BOTTLE ONLY CRITICAL RESULT CALLED TO, READ BACK BY AND VERIFIED WITH: PHARMD Ronal RAMAN on 012026 @1908  by SM    Culture (A)  Final    STAPHYLOCOCCUS EPIDERMIDIS THE SIGNIFICANCE OF ISOLATING THIS ORGANISM FROM A SINGLE SET OF BLOOD CULTURES WHEN MULTIPLE SETS ARE DRAWN IS UNCERTAIN. PLEASE NOTIFY THE MICROBIOLOGY DEPARTMENT WITHIN ONE WEEK IF SPECIATION AND SENSITIVITIES ARE REQUIRED. Performed at [facility], [address], [place], [place] [address]    Report Status PENDING  Incomplete  Culture, blood (routine x 2)     Status: None (Preliminary result)   Collection Time: 04/05/24  5:58 PM   Specimen: BLOOD RIGHT HAND  Result Value Ref Range Status    Specimen Description   Final    BLOOD RIGHT HAND Performed at [facility], [address], [place], [place] [address]    Special Requests   Final    BOTTLES DRAWN AEROBIC AND ANAEROBIC Blood Culture adequate volume Performed at [facility], [address], [place], [place] [address]    Culture   Final    NO GROWTH 2 DAYS Performed at [facility], [address] [place], [place] [address]    Report Status PENDING  Incomplete  Blood Culture ID Panel (Reflexed)     Status: Abnormal   Collection Time: 04/05/24  5:58 PM  Result Value Ref Range Status   Enterococcus faecalis NOT DETECTED NOT DETECTED Final   Enterococcus Faecium NOT DETECTED NOT DETECTED Final   Listeria monocytogenes NOT DETECTED NOT DETECTED Final   Staphylococcus species DETECTED (A) NOT DETECTED Final    Comment: CRITICAL RESULT CALLED TO, READ BACK BY AND VERIFIED WITH: PHARMD Ronal RAMAN on 012026 @1908  by SM    Staphylococcus aureus (BCID) NOT DETECTED NOT DETECTED Final   Staphylococcus epidermidis DETECTED (A) NOT DETECTED Final    Comment: Methicillin (oxacillin) resistant coagulase negative staphylococcus. Possible blood culture contaminant (unless isolated from more than one blood culture draw or clinical case suggests pathogenicity). No antibiotic treatment is indicated for blood  culture contaminants. CRITICAL RESULT CALLED TO, READ BACK BY AND VERIFIED WITH: PHARMD Ronal RAMAN on 012026 @1908  by SM    Staphylococcus lugdunensis NOT DETECTED NOT DETECTED Final   Streptococcus species NOT DETECTED NOT DETECTED Final   Streptococcus agalactiae NOT DETECTED NOT DETECTED Final   Streptococcus pneumoniae NOT DETECTED NOT DETECTED Final   Streptococcus pyogenes NOT DETECTED NOT DETECTED Final   A.calcoaceticus-baumannii NOT DETECTED NOT DETECTED Final   Bacteroides fragilis NOT DETECTED NOT DETECTED Final   Enterobacterales NOT DETECTED NOT DETECTED Final   Enterobacter cloacae complex  NOT DETECTED NOT DETECTED Final   Escherichia coli NOT DETECTED NOT DETECTED Final   Klebsiella aerogenes NOT DETECTED NOT DETECTED Final   Klebsiella oxytoca NOT DETECTED NOT DETECTED Final   Klebsiella pneumoniae NOT DETECTED NOT DETECTED Final   Proteus species NOT DETECTED NOT DETECTED Final   Salmonella species NOT DETECTED NOT  DETECTED Final   Serratia marcescens NOT DETECTED NOT DETECTED Final   Haemophilus influenzae NOT DETECTED NOT DETECTED Final   Neisseria meningitidis NOT DETECTED NOT DETECTED Final   Pseudomonas aeruginosa NOT DETECTED NOT DETECTED Final   Stenotrophomonas maltophilia NOT DETECTED NOT DETECTED Final   Candida albicans NOT DETECTED NOT DETECTED Final   Candida auris NOT DETECTED NOT DETECTED Final   Candida glabrata NOT DETECTED NOT DETECTED Final   Candida krusei NOT DETECTED NOT DETECTED Final   Candida parapsilosis NOT DETECTED NOT DETECTED Final   Candida tropicalis NOT DETECTED NOT DETECTED Final   Cryptococcus neoformans/gattii NOT DETECTED NOT DETECTED Final   Methicillin resistance mecA/C DETECTED (A) NOT DETECTED Final    Comment: CRITICAL RESULT CALLED TO, READ BACK BY AND VERIFIED WITH: MAYA Ronal RAMAN on 012026 @1908  by SM Performed at [facility], [address], [place], [place] [address]   MRSA Next Gen by PCR, Nasal     Status: None   Collection Time: 04/05/24  6:52 PM   Specimen: Nasal Mucosa; Nasal Swab  Result Value Ref Range Status   MRSA by PCR Next Gen NOT DETECTED NOT DETECTED Final    Comment: (NOTE) The GeneXpert MRSA Assay (FDA approved for NASAL specimens only), is one component of a comprehensive MRSA colonization surveillance program. It is not intended to diagnose MRSA infection nor to guide or monitor treatment for MRSA infections. Test performance is not FDA approved in patients less than 58 years old. Performed at [facility], [address], [place], [place] [address]       Radiology  Studies: DG Chest Portable 1 View Result Date: 04/05/2024 EXAM: 1 VIEW(S) XRAY OF THE CHEST 04/05/2024 03:54:00 PM COMPARISON: 03/26/2024 CLINICAL HISTORY: hf hf hf FINDINGS: LUNGS AND PLEURA: Left basilar airspace opacity with obscuration of the diaphragmatic leaflet. Improving atelectasis or pneumonia at the right lung base. Small left pleural effusion. No pneumothorax. HEART AND MEDIASTINUM: Heart size at upper limits. Aortic atherosclerosis. BONES AND SOFT TISSUES: No acute osseous abnormality. IMPRESSION: 1. Left basilar airspace opacity with obscuration of the diaphragmatic leaflet. 2. Improving atelectasis or pneumonia at the right lung base. 3. Small left pleural effusion. Electronically signed by: Greig Pique MD 04/05/2024 04:26 PM EST RP Workstation: [id number]      Scheduled Meds:  ARIPiprazole   2 mg Oral Daily   budesonide   1 mg Nebulization BID   buPROPion   150 mg Oral Daily   docusate sodium   100 mg Oral QHS   ferrous sulfate   325 mg Oral Q breakfast   folic acid   1 mg Oral Daily   guaiFENesin   600 mg Oral BID   heparin  injection (subcutaneous)  5,000 Units Subcutaneous Q8H   memantine   5 mg Oral BID   mouth rinse  15 mL Mouth Rinse 4 times per day   pantoprazole   40 mg Oral Daily   polyethylene glycol  17 g Oral Daily   predniSONE   5 mg Oral Q breakfast   rOPINIRole   1 mg Oral Daily   And   rOPINIRole   2 mg Oral QHS   sacubitril -valsartan   1 tablet Oral BID   sertraline   50 mg Oral Daily   spironolactone   12.5 mg Oral Daily   umeclidinium bromide   1 puff Inhalation Daily   Continuous Infusions:  sodium chloride  10 mL (04/06/24 1845)   ceFEPime  (MAXIPIME ) IV 200 mL/hr at 04/07/24 0630     LOS: 2 days   Time spent: 25 minutes  Delon Hoe, DO [facility] 04/07/2024, 1:22 PM   Available via Epic secure chat 7am-7pm After these hours, please refer to coverage provider listed on [email or web address]  "

## 2024-04-07 NOTE — Evaluation (Signed)
 Physical Therapy Evaluation Patient Details Name: [name] MRN: [id number] DOB: [birth year] Today's Date: 04/07/2024  History of Present Illness  [name] is a 89 y.o. female admitted 04/05/24 for acute systolic congestive heart failure. Of note, discharged from [facility] on 03/29/24 after stay for acute on chronic hypoxic respiratory failure, treated for acute systolic CHF as well as LLL consolidation. Also, recent hospitalization s/p fall and L pubic bone fx + rib fx 12/10-12/13. Pt PMH includes but is not limited to: dementia, asthma, chronic hypoxemic respiratory failure with pt on 2 L/min supplemental O2 at baseline, frequent falls, HTN, HLD, CKD III, anxiety, depression, GCA, polymyalgia rheumatic on actemra  infusions, RLS, GERD, vit B 12 deficiency, back surgery, R DRF 12/2023, and R ankle fx/ORIF.  Clinical Impression  Pt admitted with above diagnosis. Daughter reports with multiple hospitalizations since Dec, pt transferring bed<>w/c, nonambulatory, daughter assisting with baths in hospital bed. On eval, pt appears to have word finding difficulty, low frustration tolerance with daughter reporting this is not patient's baseline. Pt needing mod A with max multimodal cues to initiate and mobilize to bedside. Once seated, therapist showed pt recliner, encouraged OOB activity, lunch tray arrived but pt adamantly declines to get OOB, requesting to return to supine so mod A to return to supine in bed. Pt on 3L O2 with SpO2 99%, cued for pursed lip breathing and relaxation with mobility. Recommend HHPT at d/c with continued 24/7 assist from family. Pt currently with functional limitations due to the deficits listed below (see PT Problem List). Pt will benefit from acute skilled PT to increase their independence and safety with mobility to allow discharge.           If plan is discharge home, recommend the following: A little help with walking and/or transfers;A little help with  bathing/dressing/bathroom;Assistance with cooking/housework;Assist for transportation   Can travel by private vehicle        Equipment Recommendations None recommended by PT  Recommendations for Other Services       Functional Status Assessment Patient has had a recent decline in their functional status and demonstrates the ability to make significant improvements in function in a reasonable and predictable amount of time.     Precautions / Restrictions Precautions Precautions: Fall Recall of Precautions/Restrictions: Impaired Restrictions Weight Bearing Restrictions Per Provider Order: No      Mobility  Bed Mobility Overal bed mobility: Needs Assistance Bed Mobility: Supine to Sit, Sit to Supine     Supine to sit: Mod assist, HOB elevated, Used rails Sit to supine: Mod assist   General bed mobility comments: mod A to mobilize to bedside, max multimodal cues to initiate and engage in activity; mod A to lift BLE back into bed and reposition to comfort    Transfers                   General transfer comment: unable to encourage/cue    Ambulation/Gait                  Stairs            Wheelchair Mobility     Tilt Bed    Modified Rankin (Stroke Patients Only)       Balance Overall balance assessment: Needs assistance Sitting-balance support: Feet supported, Bilateral upper extremity supported Sitting balance-Leahy Scale: Fair  Pertinent Vitals/Pain Pain Assessment Pain Assessment: No/denies pain    Home Living Family/patient expects to be discharged to:: Private residence Living Arrangements: Children (daughter) Available Help at Discharge: Family;Available 24 hours/day (daughter and sister in law to assist) Type of Home: House Home Access: Ramped entrance       Home Layout: One level Home Equipment: Rollator (4 wheels);BSC/3in1;Shower seat;Wheelchair - manual;Grab bars -  tub/shower;Hand held shower head;Adaptive equipment;Hospital bed Additional Comments: Daughter provided information; 2 L O2 at home    Prior Function Prior Level of Function : Needs assist             Mobility Comments: household ambulation using Rollator, uses wheelchair for longer distances; since pelvic fx Dec 2025 pt has only been able to stand pivot to w/c with help; since d/c home 1/12 pt trf bed<>w/c with daugther assisting, daughter bathing pt in bed ADLs Comments: Pt needs assist for all ADLs (even prior to pelvic fx); since d/c home 1/12 pt trf bed<>w/c, daughter bathing pt in bed     Extremity/Trunk Assessment   Upper Extremity Assessment Upper Extremity Assessment: Generalized weakness    Lower Extremity Assessment Lower Extremity Assessment: Generalized weakness (AROM WFL, strength grossly 3/5 while mobilizing BLE freely in bed)    Cervical / Trunk Assessment Cervical / Trunk Assessment: Kyphotic  Communication   Communication Communication: Impaired Factors Affecting Communication: Hearing impaired    Cognition Arousal: Alert Behavior During Therapy: Agitated                           PT - Cognition Comments: pt appears to have some word finding difficulty, also noted to have low frustration tolerance and daughter at bedside reports this is not pt's cognitive baseline; cues for sequencing and engagement with therapy with fair carryover Following commands: Impaired Following commands impaired: Follows one step commands inconsistently, Follows one step commands with increased time     Cueing Cueing Techniques: Verbal cues     General Comments      Exercises     Assessment/Plan    PT Assessment Patient needs continued PT services  PT Problem List Decreased strength;Decreased activity tolerance;Decreased balance;Decreased mobility;Decreased cognition;Decreased knowledge of use of DME;Decreased safety awareness;Decreased knowledge of  precautions       PT Treatment Interventions DME instruction;Gait training;Functional mobility training;Therapeutic activities;Therapeutic exercise;Balance training;Neuromuscular re-education;Cognitive remediation;Patient/family education;Wheelchair mobility training    PT Goals (Current goals can be found in the Care Plan section)  Acute Rehab PT Goals Patient Stated Goal: daughter reports goal is to take pt home PT Goal Formulation: With patient/family Time For Goal Achievement: 04/21/24 Potential to Achieve Goals: Fair    Frequency Min 2X/week     Co-evaluation               AM-PAC PT 6 Clicks Mobility  Outcome Measure Help needed turning from your back to your side while in a flat bed without using bedrails?: A Lot Help needed moving from lying on your back to sitting on the side of a flat bed without using bedrails?: A Lot Help needed moving to and from a bed to a chair (including a wheelchair)?: A Lot Help needed standing up from a chair using your arms (e.g., wheelchair or bedside chair)?: A Lot Help needed to walk in hospital room?: Total Help needed climbing 3-5 steps with a railing? : Total 6 Click Score: 10    End of Session   Activity Tolerance: Patient tolerated treatment  well Patient left: in bed;with call bell/phone within reach;with bed alarm set Nurse Communication: Mobility status PT Visit Diagnosis: Other abnormalities of gait and mobility (R26.89);Muscle weakness (generalized) (M62.81)    Time: 8862-8794 PT Time Calculation (min) (ACUTE ONLY): 28 min   Charges:   PT Evaluation $PT Eval Moderate Complexity: 1 Mod PT Treatments $Therapeutic Activity: 8-22 mins PT General Charges $$ ACUTE PT VISIT: 1 Visit         Tori Oscar Forman PT, DPT 04/07/24, 12:57 PM

## 2024-04-08 DIAGNOSIS — J189 Pneumonia, unspecified organism: Secondary | ICD-10-CM | POA: Diagnosis not present

## 2024-04-08 LAB — BASIC METABOLIC PANEL WITH GFR
Anion gap: 9 (ref 5–15)
BUN: 23 mg/dL (ref 8–23)
CO2: 27 mmol/L (ref 22–32)
Calcium: 9.3 mg/dL (ref 8.9–10.3)
Chloride: 105 mmol/L (ref 98–111)
Creatinine, Ser: 0.78 mg/dL (ref 0.44–1.00)
GFR, Estimated: >60 mL/min
Glucose, Bld: 116 mg/dL — ABNORMAL HIGH (ref 70–99)
Potassium: 4.6 mmol/L (ref 3.5–5.1)
Sodium: 141 mmol/L (ref 135–145)

## 2024-04-08 LAB — CULTURE, BLOOD (ROUTINE X 2): Special Requests: ADEQUATE

## 2024-04-08 LAB — CBC
HCT: 35.1 % — ABNORMAL LOW (ref 36.0–46.0)
Hemoglobin: 10.3 g/dL — ABNORMAL LOW (ref 12.0–15.0)
MCH: 28.9 pg (ref 26.0–34.0)
MCHC: 29.3 g/dL — ABNORMAL LOW (ref 30.0–36.0)
MCV: 98.6 fL (ref 80.0–100.0)
Platelets: 205 K/uL (ref 150–400)
RBC: 3.56 MIL/uL — ABNORMAL LOW (ref 3.87–5.11)
RDW: 15.4 % (ref 11.5–15.5)
WBC: 13.8 K/uL — ABNORMAL HIGH (ref 4.0–10.5)
nRBC: 0 % (ref 0.0–0.2)

## 2024-04-08 MED ORDER — ALBUTEROL SULFATE (2.5 MG/3ML) 0.083% IN NEBU
2.5000 mg | INHALATION_SOLUTION | Freq: Two times a day (BID) | RESPIRATORY_TRACT | Status: DC
  Administered 2024-04-08 – 2024-04-09 (×2): 2.5 mg via RESPIRATORY_TRACT
  Filled 2024-04-08 (×2): qty 3

## 2024-04-08 NOTE — Progress Notes (Signed)
 " PROGRESS NOTE    [name]  FMW:[id number] DOB: 21-Sep-1929 DOA: 04/05/2024 PCP: Wendolyn Jenkins Jansky, MD     Brief Narrative:  [name] is a 89 y.o. female with medical history significant for asthma, chronic hypoxia on 2 L, PMR on chronic prednisone , CKD stage IIIa, recent hospitalization for newly diagnosed heart failure with reduced EF now being admitted to the hospital with weight gain and lower extremity edema likely due to acute on chronic heart failure with reduced EF and suspicion of recurrent pneumonia.  History is provided mainly by the patient's daughter who is at the bedside, and is her primary caregiver.  She was discharged from [facility] on 1/12 after stay for acute on chronic hypoxic respiratory failure, treated for acute systolic CHF as well as LLL consolidation.  She completed a course of IV azithromycin  and IV Rocephin  during that hospitalization, and was successfully diuresed with IV Lasix .  She was seen by cardiology and started on GDMT.  Per discharge summary on the day of discharge, Lasix  was held on that day due to slight bump in creatinine.  Patient's daughter states she has been taking 1/2 tablet of Lasix  at home.  Overall she has been doing well, seem to get better from a respiratory standpoint by the time of discharge, however over the last few days she has once again developed lower extremity edema, a very wet sounding nonproductive cough and dyspnea.  She remains on her baseline 2 L nasal cannula oxygen .   New events last 24 hours / Subjective: Sitting in bed, eating breakfast this morning  Assessment & Plan:   Principal Problem:   HCAP (healthcare-associated pneumonia) Active Problems:   Polymyalgia rheumatica   Giant cell arteritis (HCC)   RLS (restless legs syndrome)   Acute systolic (congestive) heart failure (HCC)   HCAP - Cefepime  - Blood culture with single set showing Staph epidermidis, likely to be a contaminant  Acute on chronic systolic CHF  -  Entresto , Aldactone , Coreg  (Coreg  on hold currently)  Chronic hypoxic respiratory failure - On 2 L nasal cannula O2 at baseline  CKD 3a - Stable  Dementia - Abilify , Wellbutrin , sertraline , Requip , Namenda   Giant cell arteritis/PMR - Prednisone   Liver mass/circumferential wall thickening transverse colon - Had previously deferred further workup, did not want to pursue any oncologic treatment   DVT prophylaxis:  heparin  injection 5,000 Units Start: 04/05/24 2200  Code Status: DNR Family Communication: Daughter at bedside Disposition Plan: Home with home health Status is: Inpatient Remains inpatient appropriate because: IV antibiotics    Antimicrobials:  Anti-infectives (From admission, onward)    Start     Dose/Rate Route Frequency Ordered Stop   04/06/24 1800  vancomycin  (VANCOREADY) IVPB 750 mg/150 mL  Status:  Discontinued        750 mg 150 mL/hr over 60 Minutes Intravenous Every 24 hours 04/05/24 1713 04/07/24 1315   04/06/24 0600  ceFEPIme  (MAXIPIME ) 2 g in sodium chloride  0.9 % 100 mL IVPB        2 g 200 mL/hr over 30 Minutes Intravenous Every 12 hours 04/05/24 1713     04/05/24 1715  vancomycin  (VANCOREADY) IVPB 1500 mg/300 mL        1,500 mg 150 mL/hr over 120 Minutes Intravenous  Once 04/05/24 1713 04/05/24 2040   04/05/24 1645  vancomycin  (VANCOCIN ) IVPB 1000 mg/200 mL premix  Status:  Discontinued        1,000 mg 200 mL/hr over 60 Minutes Intravenous  Once 04/05/24 1644  04/05/24 1711   04/05/24 1645  ceFEPIme  (MAXIPIME ) 2 g in sodium chloride  0.9 % 100 mL IVPB        2 g 200 mL/hr over 30 Minutes Intravenous  Once 04/05/24 1644 04/05/24 1827        Objective: Vitals:   04/08/24 0530 04/08/24 0722 04/08/24 0923 04/08/24 0925  BP: (!) 152/54 132/60    Pulse: 70 60    Resp: 18     Temp: 98.3 F (36.8 C) 98.9 F (37.2 C)    TempSrc: Oral Oral    SpO2: 92% 95% 96% 96%  Weight:      Height:        Intake/Output Summary (Last 24 hours) at  04/08/2024 1059 Last data filed at 04/08/2024 0900 Gross per 24 hour  Intake 766.16 ml  Output 350 ml  Net 416.16 ml   Filed Weights   04/05/24 1451  Weight: 73.9 kg    Examination:  General exam: Appears calm and comfortable  Respiratory system: Clear to auscultation anteriorly, no resp distress, on nasal cannula O2 Cardiovascular system: S1 & S2 heard, RRR. No murmurs.  No pedal edema. Gastrointestinal system: Abdomen is nondistended, soft and nontender. Normal bowel sounds heard. Extremities: Symmetric in appearance   Data Reviewed: I have personally reviewed following labs and imaging studies  CBC: Recent Labs  Lab 04/05/24 1608 04/06/24 0456 04/07/24 0411 04/08/24 0416  WBC 14.5* 14.0* 10.9* 13.8*  NEUTROABS 12.0*  --   --   --   HGB 10.3* 9.7* 8.6* 10.3*  HCT 34.7* 32.1* 29.3* 35.1*  MCV 96.9 97.0 98.7 98.6  PLT 262 255 194 205   Basic Metabolic Panel: Recent Labs  Lab 04/05/24 1608 04/06/24 0456 04/07/24 0411 04/08/24 0416  NA 141 142 144 141  K 5.3* 4.8 4.5 4.6  CL 102 103 107 105  CO2 31 33* 34* 27  GLUCOSE 143* 100* 85 116*  BUN 27* 25* 24* 23  CREATININE 0.92 0.95 0.82 0.78  CALCIUM  9.2 9.3 9.3 9.3   GFR: Estimated Creatinine Clearance: 43.2 mL/min (by C-G formula based on SCr of 0.78 mg/dL). Liver Function Tests: No results for input(s): AST, ALT, ALKPHOS, BILITOT, PROT, ALBUMIN in the last 168 hours. No results for input(s): LIPASE, AMYLASE in the last 168 hours. No results for input(s): AMMONIA in the last 168 hours. Coagulation Profile: No results for input(s): INR, PROTIME in the last 168 hours. Cardiac Enzymes: No results for input(s): CKTOTAL, CKMB, CKMBINDEX, TROPONINI in the last 168 hours. BNP (last 3 results) Recent Labs    03/22/24 1609 04/05/24 1608 04/06/24 0456  PROBNP 23,103.0* >35,000.0* >35,000.0*   HbA1C: No results for input(s): HGBA1C in the last 72 hours. CBG: No results for  input(s): GLUCAP in the last 168 hours. Lipid Profile: No results for input(s): CHOL, HDL, LDLCALC, TRIG, CHOLHDL, LDLDIRECT in the last 72 hours. Thyroid  Function Tests: No results for input(s): TSH, T4TOTAL, FREET4, T3FREE, THYROIDAB in the last 72 hours. Anemia Panel: No results for input(s): VITAMINB12, FOLATE, FERRITIN, TIBC, IRON, RETICCTPCT in the last 72 hours. Sepsis Labs: Recent Labs  Lab 04/05/24 1753  LATICACIDVEN 0.7    Recent Results (from the past 240 hours)  Culture, blood (routine x 2)     Status: Abnormal   Collection Time: 04/05/24  5:58 PM   Specimen: BLOOD LEFT HAND  Result Value Ref Range Status   Specimen Description   Final    BLOOD LEFT HAND Performed at [facility],  [address], [place], [place] [address]    Special Requests   Final    BOTTLES DRAWN AEROBIC AND ANAEROBIC Blood Culture adequate volume Performed at [facility], [address], [place], [place] [address]    Culture  Setup Time   Final    GRAM POSITIVE COCCI ANAEROBIC BOTTLE ONLY CRITICAL RESULT CALLED TO, READ BACK BY AND VERIFIED WITH: PHARMD Ronal RAMAN on 012026 @1908  by SM    Culture (A)  Final    STAPHYLOCOCCUS EPIDERMIDIS THE SIGNIFICANCE OF ISOLATING THIS ORGANISM FROM A SINGLE SET OF BLOOD CULTURES WHEN MULTIPLE SETS ARE DRAWN IS UNCERTAIN. PLEASE NOTIFY THE MICROBIOLOGY DEPARTMENT WITHIN ONE WEEK IF SPECIATION AND SENSITIVITIES ARE REQUIRED. Performed at [facility], [address] [place], [place] [address]    Report Status 04/08/2024 FINAL  Final  Culture, blood (routine x 2)     Status: None (Preliminary result)   Collection Time: 04/05/24  5:58 PM   Specimen: BLOOD RIGHT HAND  Result Value Ref Range Status   Specimen Description   Final    BLOOD RIGHT HAND Performed at [facility], [address], [place], [place] [address]    Special Requests   Final    BOTTLES DRAWN AEROBIC AND ANAEROBIC  Blood Culture adequate volume Performed at [facility], [address], [place], [place] [address]    Culture   Final    NO GROWTH 3 DAYS Performed at [facility], [address] [place], [place] [address]    Report Status PENDING  Incomplete  Blood Culture ID Panel (Reflexed)     Status: Abnormal   Collection Time: 04/05/24  5:58 PM  Result Value Ref Range Status   Enterococcus faecalis NOT DETECTED NOT DETECTED Final   Enterococcus Faecium NOT DETECTED NOT DETECTED Final   Listeria monocytogenes NOT DETECTED NOT DETECTED Final   Staphylococcus species DETECTED (A) NOT DETECTED Final    Comment: CRITICAL RESULT CALLED TO, READ BACK BY AND VERIFIED WITH: PHARMD Ronal RAMAN on 012026 @1908  by SM    Staphylococcus aureus (BCID) NOT DETECTED NOT DETECTED Final   Staphylococcus epidermidis DETECTED (A) NOT DETECTED Final    Comment: Methicillin (oxacillin) resistant coagulase negative staphylococcus. Possible blood culture contaminant (unless isolated from more than one blood culture draw or clinical case suggests pathogenicity). No antibiotic treatment is indicated for blood  culture contaminants. CRITICAL RESULT CALLED TO, READ BACK BY AND VERIFIED WITH: PHARMD Ronal RAMAN on 012026 @1908  by SM    Staphylococcus lugdunensis NOT DETECTED NOT DETECTED Final   Streptococcus species NOT DETECTED NOT DETECTED Final   Streptococcus agalactiae NOT DETECTED NOT DETECTED Final   Streptococcus pneumoniae NOT DETECTED NOT DETECTED Final   Streptococcus pyogenes NOT DETECTED NOT DETECTED Final   A.calcoaceticus-baumannii NOT DETECTED NOT DETECTED Final   Bacteroides fragilis NOT DETECTED NOT DETECTED Final   Enterobacterales NOT DETECTED NOT DETECTED Final   Enterobacter cloacae complex NOT DETECTED NOT DETECTED Final   Escherichia coli NOT DETECTED NOT DETECTED Final   Klebsiella aerogenes NOT DETECTED NOT DETECTED Final   Klebsiella oxytoca NOT DETECTED NOT DETECTED Final    Klebsiella pneumoniae NOT DETECTED NOT DETECTED Final   Proteus species NOT DETECTED NOT DETECTED Final   Salmonella species NOT DETECTED NOT DETECTED Final   Serratia marcescens NOT DETECTED NOT DETECTED Final   Haemophilus influenzae NOT DETECTED NOT DETECTED Final   Neisseria meningitidis NOT DETECTED NOT DETECTED Final   Pseudomonas  aeruginosa NOT DETECTED NOT DETECTED Final   Stenotrophomonas maltophilia NOT DETECTED NOT DETECTED Final   Candida albicans NOT DETECTED NOT DETECTED Final   Candida auris NOT DETECTED NOT DETECTED Final   Candida glabrata NOT DETECTED NOT DETECTED Final   Candida krusei NOT DETECTED NOT DETECTED Final   Candida parapsilosis NOT DETECTED NOT DETECTED Final   Candida tropicalis NOT DETECTED NOT DETECTED Final   Cryptococcus neoformans/gattii NOT DETECTED NOT DETECTED Final   Methicillin resistance mecA/C DETECTED (A) NOT DETECTED Final    Comment: CRITICAL RESULT CALLED TO, READ BACK BY AND VERIFIED WITH: MAYA Ronal RAMAN on 012026 @1908  by SM Performed at [facility], [address], [place], [place] [address]   MRSA Next Gen by PCR, Nasal     Status: None   Collection Time: 04/05/24  6:52 PM   Specimen: Nasal Mucosa; Nasal Swab  Result Value Ref Range Status   MRSA by PCR Next Gen NOT DETECTED NOT DETECTED Final    Comment: (NOTE) The GeneXpert MRSA Assay (FDA approved for NASAL specimens only), is one component of a comprehensive MRSA colonization surveillance program. It is not intended to diagnose MRSA infection nor to guide or monitor treatment for MRSA infections. Test performance is not FDA approved in patients less than 55 years old. Performed at [facility], [address] [place], [place] [address]       Radiology Studies: No results found.     Scheduled Meds:  albuterol   2.5 mg Nebulization BID   ARIPiprazole   2 mg Oral Daily   budesonide   1 mg Nebulization BID   buPROPion   150 mg Oral Daily    docusate sodium   100 mg Oral QHS   ferrous sulfate   325 mg Oral Q breakfast   folic acid   1 mg Oral Daily   guaiFENesin   600 mg Oral BID   heparin  injection (subcutaneous)  5,000 Units Subcutaneous Q8H   memantine   5 mg Oral BID   mouth rinse  15 mL Mouth Rinse 4 times per day   pantoprazole   40 mg Oral Daily   polyethylene glycol  17 g Oral Daily   predniSONE   5 mg Oral Q breakfast   rOPINIRole   1 mg Oral Daily   And   rOPINIRole   2 mg Oral QHS   sacubitril -valsartan   1 tablet Oral BID   sertraline   50 mg Oral Daily   spironolactone   12.5 mg Oral Daily   umeclidinium bromide   1 puff Inhalation Daily   Continuous Infusions:  ceFEPime  (MAXIPIME ) IV 2 g (04/08/24 0550)     LOS: 3 days   Time spent: 25 minutes   Delon Hoe, DO [facility] 04/08/2024, 10:59 AM   Available via Epic secure chat 7am-7pm After these hours, please refer to coverage provider listed on [email or web address]  "

## 2024-04-08 NOTE — Evaluation (Signed)
 Occupational Therapy Evaluation Patient Details Name: [name] MRN: [id number] DOB: Jul 30, 1929 Today's Date: 04/08/2024   History of Present Illness   [name] is a 89 yr old female admitted 04/05/24 with LE edema and cough. Of note, she was discharged from [facility] on 03/29/24 after being diagnosed with acute on chronic hypoxic respiratory failure, treated for acute systolic CHF as well as LLL consolidation. Also, recent hospitalization 02-25-24 to 02-28-24 s/p fall and L pubic bone fracture + rib fracture. PMH includes but is not limited to: dementia, asthma, chronic hypoxemic respiratory failure with pt on 2 L/min supplemental O2 at baseline, frequent falls, HTN, HLD, CKD III, anxiety, depression, GCA, polymyalgia rheumatic on actemra  infusions, RLS, GERD, vit B 12 deficiency, back surgery, R DRF 12/2023, and R ankle fracture/ORIF.     Clinical Impressions The pt is currently limited by the below listed deficits (see OT problem list). As such, her ADL performance is compromised and she is requiring assistance for self care management. During the session today, she required mod assist for sit to stand using a RW, max assist for sock management, and min assist for simulated upper body dressing. She was also noted to be with unsteadiness in standing, occasional word finding difficulty, intermittent memory lapses, and mild confusion; per the pt's daughter, the pt's confusion and difficulty with memory/recall started ~3 days ago. The pt will benefit from OT services to maximize her safety and independence with self care tasks. The pt's daughter declines SNF rehab & prefers to take the pt home with home health OT and family support at discharge.      If plan is discharge home, recommend the following:   A lot of help with walking and/or transfers;A lot of help with bathing/dressing/bathroom;Assistance with cooking/housework;Assist for transportation;Help with stairs or ramp for entrance;Direct  supervision/assist for medications management;Direct supervision/assist for financial management;Supervision due to cognitive status     Functional Status Assessment   Patient has had a recent decline in their functional status and demonstrates the ability to make significant improvements in function in a reasonable and predictable amount of time.     Equipment Recommendations   None recommended by OT     Recommendations for Other Services         Precautions/Restrictions   Precautions Precautions: Fall     Mobility Bed Mobility      General bed mobility comments: pt was received seated in the chair    Transfers Overall transfer level: Needs assistance Equipment used: Rolling walker (2 wheels) Transfers: Sit to/from Stand Sit to Stand: Mod assist                  Balance     Sitting balance-Leahy Scale: Fair       Standing balance-Leahy Scale: Poor           ADL either performed or assessed with clinical judgement   ADL Overall ADL's : Needs assistance/impaired Eating/Feeding: Set up;Sitting;Supervision/ safety   Grooming: Minimal assistance;Sitting   Upper Body Bathing: Minimal assistance;Sitting   Lower Body Bathing: Moderate assistance;Sitting/lateral leans   Upper Body Dressing : Minimal assistance;Sitting   Lower Body Dressing: Maximal assistance;Sitting/lateral leans Lower Body Dressing Details (indicate cue type and reason): for management of socks seated in chair Toilet Transfer: Moderate assistance;Stand-pivot;BSC/3in1;Rolling walker (2 wheels);Cueing for sequencing;Cueing for safety   Toileting- Clothing Manipulation and Hygiene: Moderate assistance;Maximal assistance;Cueing for safety;Cueing for sequencing;Sit to/from stand Toileting - Clothing Manipulation Details (indicate cue type and reason): at bedside commode level,  based on clinical judgement                          Pertinent Vitals/Pain Pain  Assessment Pain Assessment: No/denies pain     Extremity/Trunk Assessment Upper Extremity Assessment Upper Extremity Assessment: LUE deficits/detail;RUE deficits/detail;Generalized weakness RUE Deficits / Details: AROM WFL. Functional grip strength LUE Deficits / Details: AROM WFL. Functional grip strength   Lower Extremity Assessment Lower Extremity Assessment: Overall WFL for tasks assessed;RLE deficits/detail;LLE deficits/detail RLE Deficits / Details: AROM WFL LLE Deficits / Details: AROM WFL       Communication Communication Factors Affecting Communication: Hearing impaired;Difficulty expressing self   Cognition Arousal: Alert Behavior During Therapy: WFL for tasks assessed/performed Cognition: Cognition impaired   Orientation impairments: Time, Situation   Memory impairment (select all impairments): Short-term memory, Working civil service fast streamer, Conservation officer, historic buildings Attention impairment (select first level of impairment): Divided attention, Sustained attention Executive functioning impairment (select all impairments): Problem solving, Organization OT - Cognition Comments: Oriented to person and place. Disoriented to her birthday and current date. She appeared to have occasional word finding difficulty and intermittent confusion.        Following commands: Impaired Following commands impaired: Follows one step commands inconsistently, Follows one step commands with increased time     Cueing  General Comments   Cueing Techniques: Verbal cues;Gestural cues              Home Living Family/patient expects to be discharged to:: Private residence Living Arrangements: Children (Pt was living with her son who recently passed away. Her daughter lives next door and can stay with the pt when needed or the pt can stay with her) Available Help at Discharge: Family;Available 24 hours/day Type of Home: House Home Access: Ramped entrance     Home Layout: One level      Bathroom Shower/Tub: Walk-in shower         Home Equipment: Educational Psychologist (4 wheels);BSC/3in1;Rolling Environmental Consultant (2 wheels);Hospital bed;Wheelchair - manual   Additional Comments:  (Pt's daughter was present and provided information regarding her prior level of functioning and living situation. The patient used 2L O2 around the clock at home.)      Prior Functioning/Environment Prior Level of Function : Needs assist             Mobility Comments:  (She has been minimally ambulatory since December when she fell and sustained a pelvic fracture, rather she has been needing assist to transfer into and out of her wheelchair. Prior to December, she could walk household distances using a RW vs. rollator.) ADLs Comments:  (The pt's daughter has been assisting her with bathing, dressing, and toileting for at least the past 6 months. Her daughter has been managing all the household chores as well. The pt is a former geophysical data processor.)    OT Problem List: Decreased strength;Decreased activity tolerance;Impaired balance (sitting and/or standing);Decreased coordination;Decreased cognition;Decreased knowledge of use of DME or AE   OT Treatment/Interventions: Self-care/ADL training;Therapeutic exercise;Neuromuscular education;Energy conservation;DME and/or AE instruction;Therapeutic activities;Patient/family education;Balance training;Cognitive remediation/compensation      OT Goals(Current goals can be found in the care plan section)   Acute Rehab OT Goals Patient Stated Goal: the pt's daughter desires to take the pt home at discharge OT Goal Formulation: With patient/family Time For Goal Achievement: 04/22/24 Potential to Achieve Goals: Good ADL Goals Pt Will Perform Upper Body Dressing: with set-up;with supervision;sitting Pt Will Perform Lower Body Dressing: with contact guard assist;sit to/from stand;sitting/lateral leans  Pt Will Transfer to Toilet: with contact guard  assist;ambulating;bedside commode Pt Will Perform Toileting - Clothing Manipulation and hygiene: with contact guard assist;sit to/from stand   OT Frequency:  Min 2X/week       AM-PAC OT 6 Clicks Daily Activity     Outcome Measure Help from another person eating meals?: A Little Help from another person taking care of personal grooming?: A Little Help from another person toileting, which includes using toliet, bedpan, or urinal?: A Lot Help from another person bathing (including washing, rinsing, drying)?: A Lot Help from another person to put on and taking off regular upper body clothing?: A Little Help from another person to put on and taking off regular lower body clothing?: A Lot 6 Click Score: 15   End of Session Equipment Utilized During Treatment: Gait belt;Rolling walker (2 wheels) Nurse Communication: Mobility status  Activity Tolerance: Patient tolerated treatment well Patient left: in chair;with call bell/phone within reach;with family/visitor present  OT Visit Diagnosis: Unsteadiness on feet (R26.81);Muscle weakness (generalized) (M62.81);History of falling (Z91.81);Other abnormalities of gait and mobility (R26.89);Other symptoms and signs involving cognitive function                Time: 8942-8873 OT Time Calculation (min): 29 min Charges:  OT General Charges $OT Visit: 1 Visit OT Evaluation $OT Eval Moderate Complexity: 1 Mod OT Treatments $Therapeutic Activity: 8-22 mins   Delanna JINNY Lesches, OTR/L 04/08/2024, 2:59 PM

## 2024-04-08 NOTE — Plan of Care (Signed)

## 2024-04-09 ENCOUNTER — Other Ambulatory Visit (HOSPITAL_COMMUNITY): Payer: Self-pay

## 2024-04-09 DIAGNOSIS — J189 Pneumonia, unspecified organism: Secondary | ICD-10-CM | POA: Diagnosis not present

## 2024-04-09 MED ORDER — CEFADROXIL 500 MG PO CAPS
500.0000 mg | ORAL_CAPSULE | Freq: Two times a day (BID) | ORAL | 0 refills | Status: AC
  Filled 2024-04-09: qty 4, 2d supply, fill #0

## 2024-04-09 MED ORDER — DEXTROMETHORPHAN HBR 15 MG/5ML PO SYRP
10.0000 mL | ORAL_SOLUTION | Freq: Four times a day (QID) | ORAL | 0 refills | Status: AC | PRN
  Filled 2024-04-09: qty 120, 3d supply, fill #0

## 2024-04-09 NOTE — Progress Notes (Signed)
 Discharge Medications delivered from TOC meds to bed [facility] outpatient pharmacy by this RN.

## 2024-04-09 NOTE — Progress Notes (Signed)
 Physical Therapy Treatment Patient Details Name: [name] MRN: [id number] DOB: 1929-12-07 Today's Date: 04/09/2024   History of Present Illness [name] is a 89 y.o. female admitted 04/05/24 for acute systolic congestive heart failure. Of note, discharged from [facility] on 03/29/24 after stay for acute on chronic hypoxic respiratory failure, treated for acute systolic CHF as well as LLL consolidation. Also, recent hospitalization s/p fall and L pubic bone fx + rib fx 12/10-12/13. Pt PMH includes but is not limited to: dementia, asthma, chronic hypoxemic respiratory failure with pt on 2 L/min supplemental O2 at baseline, frequent falls, HTN, HLD, CKD III, anxiety, depression, GCA, polymyalgia rheumatic on actemra  infusions, RLS, GERD, vit B 12 deficiency, back surgery, R DRF 12/2023, and R ankle fx/ORIF.    PT Comments   Pt admitted with above diagnosis.  Pt currently with functional limitations due to the deficits listed below (see PT Problem List). Pt in bed when PT arrived. Pt agreeable to therapy session although noted to have difficulty with word finding, quality of speech, memory and slight confusion. Pt noted to have [place] tube disconnected from wall and on RA saturating at 89-94%, no apparent respiratory distress or indications of SOB, pt required mod A for supine <> sit, mod A for sit to stand x 2 trials noted to have B LE instabiltiy and shaking and tolerating < 45s with mod A to maintain pt declining gait assessment at this time. Pt returned to bed, daughter arrived and indicated that pt is not at her apparent cognitive nor communication baseline, PT encouraged daughter to communicate with nursing staff due to anticipated d/c today. Daughter verbalized understanding. PT donned [place] tube and placed at 2 L/min and daughter setting pt up for lunch. Pt will benefit from acute skilled PT to increase their independence and safety with mobility to allow discharge.      If plan is discharge home, recommend  the following: Assistance with cooking/housework;Assist for transportation;Two people to help with walking and/or transfers;A lot of help with bathing/dressing/bathroom;Help with stairs or ramp for entrance   Can travel by private vehicle        Equipment Recommendations  None recommended by PT    Recommendations for Other Services       Precautions / Restrictions Precautions Precautions: Fall Recall of Precautions/Restrictions: Impaired Required Braces or Orthoses: Other Brace Other Brace: Pt wearing wrist brace for comfort PRN- cleared from ortho for no brace and wbat, tx 1/23 no wrist brace donned Restrictions Weight Bearing Restrictions Per Provider Order: No Other Position/Activity Restrictions: WBAT     Mobility  Bed Mobility Overal bed mobility: Needs Assistance Bed Mobility: Supine to Sit, Sit to Supine     Supine to sit: Mod assist, HOB elevated, Used rails Sit to supine: Mod assist   General bed mobility comments: pt required increased time and cues for supine to sit and reported pain with transitioning B LE to EOB    Transfers Overall transfer level: Needs assistance Equipment used: Rolling walker (2 wheels) Transfers: Sit to/from Stand Sit to Stand: Mod assist           General transfer comment: sit to stand from EOB x 2 with mod A, mod A to maintain balance and noted B LE instability with shaking    Ambulation/Gait               General Gait Details: pt declined to try and ambulate   Stairs  Wheelchair Mobility     Tilt Bed    Modified Rankin (Stroke Patients Only)       Balance Overall balance assessment: Needs assistance Sitting-balance support: Feet supported, Single extremity supported Sitting balance-Leahy Scale: Fair     Standing balance support: Bilateral upper extremity supported, During functional activity, Reliant on assistive device for balance Standing balance-Leahy Scale: Poor Standing balance  comment: mod A and B UE support at RW to maintain static standing balance < 45s                            Communication Communication Communication: Impaired Factors Affecting Communication: Hearing impaired;Difficulty expressing self;Reduced clarity of speech  Cognition Arousal: Alert Behavior During Therapy: WFL for tasks assessed/performed   PT - Cognitive impairments: No apparent impairments                       PT - Cognition Comments: pt appears to have some word finding difficulty, also noted to have low frustration tolerance and daughter at bedside reports this is not pt's cognitive baseline; cues for sequencing and engagement with therapy with fair carryover Following commands: Impaired Following commands impaired: Follows one step commands inconsistently, Follows one step commands with increased time    Cueing Cueing Techniques: Verbal cues, Gestural cues, Tactile cues  Exercises      General Comments        Pertinent Vitals/Pain Pain Assessment Pain Assessment: Faces Faces Pain Scale: Hurts little more Pain Location: B LE and pelvis Pain Descriptors / Indicators: Discomfort, Grimacing, Guarding Pain Intervention(s): Monitored during session, Repositioned    Home Living                          Prior Function            PT Goals (current goals can now be found in the care plan section) Acute Rehab PT Goals Patient Stated Goal: daughter reports goal is to take pt home PT Goal Formulation: With patient/family Time For Goal Achievement: 04/21/24 Potential to Achieve Goals: Fair Progress towards PT goals: Progressing toward goals (slowly)    Frequency    Min 2X/week      PT Plan      Co-evaluation              AM-PAC PT 6 Clicks Mobility   Outcome Measure  Help needed turning from your back to your side while in a flat bed without using bedrails?: A Lot Help needed moving from lying on your back to sitting  on the side of a flat bed without using bedrails?: A Lot Help needed moving to and from a bed to a chair (including a wheelchair)?: A Lot Help needed standing up from a chair using your arms (e.g., wheelchair or bedside chair)?: A Lot Help needed to walk in hospital room?: Total Help needed climbing 3-5 steps with a railing? : Total 6 Click Score: 10    End of Session Equipment Utilized During Treatment: Gait belt Activity Tolerance: Patient limited by fatigue Patient left: in bed;with call bell/phone within reach;with bed alarm set;with family/visitor present Nurse Communication: Mobility status PT Visit Diagnosis: Other abnormalities of gait and mobility (R26.89);Muscle weakness (generalized) (M62.81)     Time: 8869-8846 PT Time Calculation (min) (ACUTE ONLY): 23 min  Charges:    $Therapeutic Activity: 8-22 mins PT General Charges $$ ACUTE PT VISIT: 1 Visit  [place], PT Acute Rehab    [place] VEAR Drone 04/09/2024, 2:14 PM

## 2024-04-09 NOTE — Discharge Summary (Signed)
 Physician Discharge Summary  Alsie Younes FMW:[id number] DOB: 06/23/1929 DOA: 04/05/2024  PCP: Wendolyn Jenkins Jansky, MD  Admit date: 04/05/2024 Discharge date: 04/09/2024  Admitted From: Home Disposition: Home  Recommendations for Outpatient Follow-up:  Follow up with PCP  Discharge Condition: Stable CODE STATUS: DNR Diet recommendation:  Diet Orders (From admission, onward)     Start     Ordered   04/07/24 0757  Diet Heart Room service appropriate? No; Fluid consistency: Thin  Diet effective now       Question Answer Comment  Room service appropriate? No   Fluid consistency: Thin      04/07/24 0756            Brief/Interim Summary: [name] is a 89 y.o. female with medical history significant for asthma, chronic hypoxia on 2 L, PMR on chronic prednisone , CKD stage IIIa, recent hospitalization for newly diagnosed heart failure with reduced EF now being admitted to the hospital with weight gain and lower extremity edema likely due to acute on chronic heart failure with reduced EF and suspicion of recurrent pneumonia.  History is provided mainly by the patient's daughter who is at the bedside, and is her primary caregiver.  She was discharged from [facility] on 1/12 after stay for acute on chronic hypoxic respiratory failure, treated for acute systolic CHF as well as LLL consolidation.  She completed a course of IV azithromycin  and IV Rocephin  during that hospitalization, and was successfully diuresed with IV Lasix .  She was seen by cardiology and started on GDMT.  Per discharge summary on the day of discharge, Lasix  was held on that day due to slight bump in creatinine.  Patient's daughter states she has been taking 1/2 tablet of Lasix  at home.  Overall she has been doing well, seem to get better from a respiratory standpoint by the time of discharge, however over the last few days she has once again developed lower extremity edema, a very wet sounding nonproductive cough and dyspnea.  She  remains on her baseline 2 L nasal cannula oxygen .   Patient was diagnosed with HCAP and started on IV antibiotics.  Due to continued somnolence and fatigue, Coreg  was discontinued.  Patient had improvement in her alertness.  Continue to improve with IV antibiotics.  She was discharged home in stable condition under care of daughter.  Discharge Diagnoses:   Principal Problem:   HCAP (healthcare-associated pneumonia) Active Problems:   Polymyalgia rheumatica   Giant cell arteritis (HCC)   RLS (restless legs syndrome)   Acute systolic (congestive) heart failure (HCC)   HCAP - Cefepime  --> cefdinir  - Blood culture with single set showing Staph epidermidis, likely to be a contaminant   Acute on chronic systolic CHF  - Entresto , Aldactone , Coreg  (Coreg  on hold currently)   Chronic hypoxic respiratory failure - On 2 L nasal cannula O2 at baseline   CKD 3a - Stable   Dementia - Abilify , Wellbutrin , sertraline , Requip , Namenda    Giant cell arteritis/PMR - Prednisone  - Patient has mild chronic leukocytosis, likely in setting of chronic steroid use   Liver mass/circumferential wall thickening transverse colon - Had previously deferred further workup, did not want to pursue any oncologic treatment      Discharge Instructions  Discharge Instructions     (HEART FAILURE PATIENTS) Call MD:  Anytime you have any of the following symptoms: 1) 3 pound weight gain in 24 hours or 5 pounds in 1 week 2) shortness of breath, with or without a dry hacking cough  3) swelling in the hands, feet or stomach 4) if you have to sleep on extra pillows at night in order to breathe.   Complete by: As directed    Call MD for:  difficulty breathing, headache or visual disturbances   Complete by: As directed    Call MD for:  extreme fatigue   Complete by: As directed    Call MD for:  hives   Complete by: As directed    Call MD for:  persistant dizziness or light-headedness   Complete by: As directed     Call MD for:  persistant nausea and vomiting   Complete by: As directed    Call MD for:  severe uncontrolled pain   Complete by: As directed    Call MD for:  temperature >100.4   Complete by: As directed    Discharge instructions   Complete by: As directed    You were cared for by a hospitalist during your hospital stay. If you have any questions about your discharge medications or the care you received while you were in the hospital after you are discharged, you can call the unit and ask to speak with the hospitalist on call if the hospitalist that took care of you is not available. Once you are discharged, your primary care physician will handle any further medical issues. Please note that NO REFILLS for any discharge medications will be authorized once you are discharged, as it is imperative that you return to your primary care physician (or establish a relationship with a primary care physician if you do not have one) for your aftercare needs so that they can reassess your need for medications and monitor your lab values.   Increase activity slowly   Complete by: As directed    No wound care   Complete by: As directed       Allergies as of 04/09/2024       Reactions   Oxycodone  Itching, Other (See Comments)   Hallucinations   Statins Other (See Comments)   Muscle pain   Advair Diskus [fluticasone -salmeterol] Other (See Comments)   Mouth sores   Atorvastatin  Other (See Comments)   Severe headache   Sulfonamide Derivatives Rash        Medication List     PAUSE taking these medications    carvedilol  6.25 MG tablet Wait to take this until your doctor or other care provider tells you to start again. Commonly known as: COREG  Take 1 tablet (6.25 mg total) by mouth 2 (two) times daily with a meal.       STOP taking these medications    calcium -vitamin D  500-200 MG-UNIT tablet Commonly known as: OSCAL WITH D   cephALEXin  250 MG capsule Commonly known as: KEFLEX     guaiFENesin  600 MG 12 hr tablet Commonly known as: MUCINEX        TAKE these medications    rOPINIRole  1 MG tablet Commonly known as: REQUIP  Take 1 tablet (1 mg total) by mouth as directed. TAKE 1 TABLET IN THE MORNING AND 2 TABLETS IN THE EVENING What changed:  how much to take when to take this additional instructions The timing of this medication is very important.   acetaminophen  500 MG tablet Commonly known as: TYLENOL  Take 1 tablet (500 mg total) by mouth 3 (three) times daily. What changed: when to take this   albuterol  108 (90 Base) MCG/ACT inhaler Commonly known as: VENTOLIN  HFA Inhale 2 puffs into the lungs every 4 (four) hours as  needed for wheezing or shortness of breath. What changed: Another medication with the same name was changed. Make sure you understand how and when to take each.   albuterol  (2.5 MG/3ML) 0.083% nebulizer solution Commonly known as: PROVENTIL  Take 3 mLs (2.5 mg total) by nebulization every 6 (six) hours as needed for wheezing or shortness of breath. What changed: when to take this   ARIPiprazole  2 MG tablet Commonly known as: ABILIFY  TAKE 1 TABLET BY MOUTH EVERY DAY *NEW PRESCRIPTION REQUEST* What changed: See the new instructions.   Aspirin  Low Dose 81 MG tablet Generic drug: aspirin  EC TAKE 1 TABLET BY MOUTH EVERY DAY *NEW PRESCRIPTION REQUEST* What changed: See the new instructions.   budesonide  1 MG/2ML nebulizer solution Commonly known as: PULMICORT  INHALE THE CONTENTS OF 1 VIAL VIA NEBULIZER TWICE DAILY *NEW PRESCRIPTION REQUEST* What changed: See the new instructions.   buPROPion  150 MG 24 hr tablet Commonly known as: WELLBUTRIN  XL Take 1 tablet (150 mg total) by mouth in the morning.   CALCIUM  PO Take 1 tablet by mouth daily.   cefadroxil  500 MG capsule Commonly known as: DURICEF Take 1 capsule (500 mg total) by mouth 2 (two) times daily for 2 days.   cyanocobalamin  1000 MCG/ML injection Commonly known as: VITAMIN  B12 Inject 1 mL (1,000 mcg total) into the muscle every 30 (thirty) days.   dextromethorphan  15 MG/5ML syrup Take 10 mLs (30 mg total) by mouth 4 (four) times daily as needed for cough.   diazepam  5 MG tablet Commonly known as: VALIUM  Take 1 tablet (5 mg total) by mouth See admin instructions. Take 5 mg by mouth in the evening AS NEEDED for restlessness and an additional 5 mg once a day as needed for anxiety   docusate sodium  100 MG capsule Commonly known as: COLACE Take 1 capsule (100 mg total) by mouth daily. What changed: when to take this   estradiol 0.1 MG/GM vaginal cream Commonly known as: ESTRACE Place 1 Applicatorful vaginally daily as needed (burning).   FeroSul 325 (65 Fe) MG tablet Generic drug: ferrous sulfate  Take 1 tablet (325 mg total) by mouth daily with breakfast.   folic acid  1 MG tablet Commonly known as: FOLVITE  Take 1 tablet (1 mg total) by mouth daily.   HYDROcodone -acetaminophen  5-325 MG tablet Commonly known as: NORCO/VICODIN Take 1 tablet by mouth every 6 (six) hours as needed for moderate pain (pain score 4-6).   memantine  5 MG tablet Commonly known as: NAMENDA  Take 1 tablet (5 mg total) by mouth in the morning and at bedtime.   omega-3 acid ethyl esters 1 g capsule Commonly known as: LOVAZA  Take 2 capsules (2 g total) by mouth 2 (two) times daily.   omeprazole  40 MG capsule Commonly known as: PRILOSEC TAKE ONE (1) CAPSULE BY MOUTH TWICE DAILY *NEW PRESCRIPTION REQUEST* What changed: See the new instructions.   ondansetron  8 MG disintegrating tablet Commonly known as: ZOFRAN -ODT Take 1 tablet (8 mg total) by mouth every 8 (eight) hours as needed for nausea or vomiting. PLACE 1 TABLET ON TONGUE EVERY 8 HOURS AS NEEDED FOR NAUSEA OR VOMITING What changed:  reasons to take this additional instructions   OXYGEN  Inhale 2 L/min into the lungs continuous.   polyethylene glycol 17 g packet Commonly known as: MIRALAX  / GLYCOLAX  Take 17 g by  mouth daily. What changed:  how much to take when to take this   predniSONE  5 MG tablet Commonly known as: DELTASONE  Take 1 tablet (5 mg total)  by mouth at bedtime. Resume after finished with prednisone  taper from hospitalization What changed:  when to take this additional instructions Another medication with the same name was removed. Continue taking this medication, and follow the directions you see here.   Probiotic Chew Chew 1 each by mouth every other day.   PROBIOTIC PO Take 1 capsule by mouth every Monday, Wednesday, and Friday.   sacubitril -valsartan  24-26 MG Commonly known as: ENTRESTO  Take 1 tablet by mouth 2 (two) times daily.   sertraline  50 MG tablet Commonly known as: ZOLOFT  Take 1 tablet (50 mg total) by mouth in the morning.   Spiriva  Respimat 2.5 MCG/ACT Aers Generic drug: Tiotropium Bromide  Inhale 2 Inhalations into the lungs daily at 12 noon.   spironolactone  25 MG tablet Commonly known as: ALDACTONE  Take 0.5 tablets (12.5 mg total) by mouth daily.   Systane 0.4-0.3 % Soln Generic drug: Polyethyl Glycol-Propyl Glycol Place 1 drop into both eyes 4 (four) times daily as needed (for dryness).   tocilizumab  4 mg/kg in sodium chloride  0.9 % Inject 280 mg into the vein every 28 (twenty-eight) days.   Vitamin D3 50 MCG (2000 UT) capsule Take 2,000 Units by mouth daily.        Follow-up Information     Wendolyn Jenkins Jansky, MD. Schedule an appointment as soon as possible for a visit in 1 week(s).   Specialty: Family Medicine Contact information: [address] [place] [address] [phone]                Allergies[1]     Procedures/Studies: DG Chest Portable 1 View Result Date: 04/05/2024 EXAM: 1 VIEW(S) XRAY OF THE CHEST 04/05/2024 03:54:00 PM COMPARISON: 03/26/2024 CLINICAL HISTORY: hf hf hf FINDINGS: LUNGS AND PLEURA: Left basilar airspace opacity with obscuration of the diaphragmatic leaflet. Improving atelectasis or  pneumonia at the right lung base. Small left pleural effusion. No pneumothorax. HEART AND MEDIASTINUM: Heart size at upper limits. Aortic atherosclerosis. BONES AND SOFT TISSUES: No acute osseous abnormality. IMPRESSION: 1. Left basilar airspace opacity with obscuration of the diaphragmatic leaflet. 2. Improving atelectasis or pneumonia at the right lung base. 3. Small left pleural effusion. Electronically signed by: Greig Pique MD 04/05/2024 04:26 PM EST RP Workstation: [id number]   DG Chest Port 1 View Result Date: 03/26/2024 CLINICAL DATA:  Pleural effusion. EXAM: PORTABLE CHEST 1 VIEW COMPARISON:  03/24/2024 FINDINGS: The cardio pericardial silhouette is enlarged. Similar retrocardiac left base collapse/consolidation with small effusion. Interval increase in atelectasis or infiltrate at the right base. No acute bony abnormality. IMPRESSION: 1. Interval increase in atelectasis or infiltrate at the right base. 2. Similar retrocardiac left base collapse/consolidation with small effusion. Electronically Signed   By: Camellia Candle M.D.   On: 03/26/2024 07:15   DG Chest 2 View Result Date: 03/24/2024 CLINICAL DATA:  Hypoxia. EXAM: CHEST - 2 VIEW COMPARISON:  03/22/2024 FINDINGS: Moderate left and small right pleural effusions show no significant change. Infiltrate or atelectasis in the left lower lobe is also stable. Pulmonary hyperinflation and mild cardiomegaly again noted. IMPRESSION: No significant change in moderate left and small right pleural effusions. Stable left lower lobe infiltrate or atelectasis. Electronically Signed   By: Norleen DELENA Kil M.D.   On: 03/24/2024 09:03   ECHOCARDIOGRAM COMPLETE Result Date: 03/23/2024    ECHOCARDIOGRAM REPORT   Patient Name:   [name] Date of Exam: 03/23/2024 Medical Rec #:  [id number]     Height:       63.0 in Accession #:  [id number]    Weight:       170.6 lb Date of Birth:  [birth year]     BSA:          1.807 m Patient Age:    89 years      BP:           157/71  mmHg Patient Gender: F             HR:           142 bpm. Exam Location:  Inpatient Procedure: Cardiac Doppler, Color Doppler and 2D Echo (Both Spectral and Color            Flow Doppler were utilized during procedure). Indications:    CHF  History:        Patient has no prior history of Echocardiogram examinations.                 Risk Factors:Hypertension.  Sonographer:    Philomena Daring Referring Phys: [id number] VISHAL R PATEL IMPRESSIONS  1. Left ventricular ejection fraction, by estimation, is 35 to 40%. The left ventricle has moderately decreased function. The left ventricle demonstrates global hypokinesis. There is mild concentric left ventricular hypertrophy. Left ventricular diastolic parameters are indeterminate.  2. Right ventricular systolic function is normal. The right ventricular size is normal.  3. Left atrial size was moderately dilated.  4. Moderate pleural effusion.  5. The mitral valve is degenerative. Moderate mitral valve regurgitation. No evidence of mitral stenosis.  6. The aortic valve is tricuspid. Aortic valve regurgitation is mild to moderate. No aortic stenosis is present.  7. The inferior vena cava is dilated in size with <50% respiratory variability, suggesting right atrial pressure of 15 mmHg. Comparison(s): No prior Echocardiogram. FINDINGS  Left Ventricle: Left ventricular ejection fraction, by estimation, is 35 to 40%. The left ventricle has moderately decreased function. The left ventricle demonstrates global hypokinesis. The left ventricular internal cavity size was normal in size. There is mild concentric left ventricular hypertrophy. Left ventricular diastolic parameters are indeterminate. Right Ventricle: The right ventricular size is normal. No increase in right ventricular wall thickness. Right ventricular systolic function is normal. Left Atrium: Left atrial size was moderately dilated. Right Atrium: Right atrial size was normal in size. Pericardium: There is no evidence of  pericardial effusion. Mitral Valve: The mitral valve is degenerative in appearance. Moderate mitral valve regurgitation. No evidence of mitral valve stenosis. Tricuspid Valve: The tricuspid valve is normal in structure. Tricuspid valve regurgitation is not demonstrated. No evidence of tricuspid stenosis. Aortic Valve: The aortic valve is tricuspid. Aortic valve regurgitation is mild to moderate. Aortic regurgitation PHT measures 252 msec. No aortic stenosis is present. Pulmonic Valve: The pulmonic valve was normal in structure. Pulmonic valve regurgitation is not visualized. No evidence of pulmonic stenosis. Aorta: The aortic root and ascending aorta are structurally normal, with no evidence of dilitation. Venous: The inferior vena cava is dilated in size with less than 50% respiratory variability, suggesting right atrial pressure of 15 mmHg. IAS/Shunts: No atrial level shunt detected by color flow Doppler. Additional Comments: There is a moderate pleural effusion.  LEFT VENTRICLE PLAX 2D LVIDd:         4.80 cm      Diastology LVIDs:         3.60 cm      LV e' medial:    3.59 cm/s LV PW:         1.10 cm      LV  E/e' medial:  40.9 LV IVS:        1.10 cm      LV e' lateral:   5.66 cm/s LVOT diam:     1.80 cm      LV E/e' lateral: 26.0 LV SV:         40 LV SV Index:   22 LVOT Area:     2.54 cm  LV Volumes (MOD) LV vol d, MOD A2C: 100.0 ml LV vol d, MOD A4C: 108.0 ml LV vol s, MOD A2C: 52.2 ml LV vol s, MOD A4C: 62.9 ml LV SV MOD A2C:     47.8 ml LV SV MOD A4C:     108.0 ml LV SV MOD BP:      47.1 ml RIGHT VENTRICLE             IVC RV Basal diam:  3.10 cm     IVC diam: 2.00 cm RV Mid diam:    2.00 cm RV S prime:     10.30 cm/s TAPSE (M-mode): 1.6 cm LEFT ATRIUM             Index        RIGHT ATRIUM           Index LA diam:        4.40 cm 2.43 cm/m   RA Area:     18.90 cm LA Vol (A2C):   75.3 ml 41.66 ml/m  RA Volume:   51.30 ml  28.38 ml/m LA Vol (A4C):   80.4 ml 44.48 ml/m LA Biplane Vol: 80.7 ml 44.65 ml/m   AORTIC VALVE LVOT Vmax:   87.70 cm/s LVOT Vmean:  58.000 cm/s LVOT VTI:    0.156 m AI PHT:      252 msec  AORTA Ao Root diam: 2.40 cm Ao Asc diam:  2.80 cm MITRAL VALVE                TRICUSPID VALVE MV Area (PHT): 5.23 cm     TR Peak grad:   6.4 mmHg MV Decel Time: 145 msec     TR Vmax:        126.00 cm/s MR Peak grad: 122.8 mmHg MR Mean grad: 81.0 mmHg     SHUNTS MR Vmax:      554.00 cm/s   Systemic VTI:  0.16 m MR Vmean:     431.0 cm/s    Systemic Diam: 1.80 cm MV E velocity: 147.00 cm/s MV A velocity: 130.00 cm/s MV E/A ratio:  1.13 Stanly Leavens MD Electronically signed by Stanly Leavens MD Signature Date/Time: 03/23/2024/12:25:10 PM    Final    DG Chest 2 View Result Date: 03/22/2024 EXAM: 2 VIEW(S) XRAY OF THE CHEST 03/22/2024 04:55:00 PM COMPARISON: 02/25/2024 CLINICAL HISTORY: SOB FINDINGS: LUNGS AND PLEURA: Mild pulmonary edema. Possible left lower lobe consolidation. Moderate left pleural effusion. No pneumothorax. HEART AND MEDIASTINUM: Cardiomegaly. Calcified aorta. BONES AND SOFT TISSUES: No acute osseous abnormality. IMPRESSION: 1. Mild pulmonary edema. 2. Moderate left pleural effusion. 3. Possible left lower lobe consolidation. 4. Cardiomegaly. Electronically signed by: Greig Pique MD 03/22/2024 05:44 PM EST RP Workstation: [id number]       Discharge Exam: Vitals:   04/09/24 0807 04/09/24 1328  BP:  (!) 148/86  Pulse:  63  Resp:  19  Temp:  98.4 F (36.9 C)  SpO2: 95% 100%    General: Pt is alert, awake, not in acute distress Cardiovascular: RRR, S1/S2 +, no edema Respiratory: CTA bilaterally,  no wheezing, no rhonchi, no respiratory distress, no conversational dyspnea, on Leon O2 Abdominal: Soft, NT, ND, bowel sounds + Extremities: no edema, no cyanosis Psych: Normal mood and affect  The results of significant diagnostics from this hospitalization (including imaging, microbiology, ancillary and laboratory) are listed below for reference.      Microbiology: Recent Results (from the past 240 hours)  Culture, blood (routine x 2)     Status: Abnormal   Collection Time: 04/05/24  5:58 PM   Specimen: BLOOD LEFT HAND  Result Value Ref Range Status   Specimen Description   Final    BLOOD LEFT HAND Performed at [facility], [address] [place], [place] [address]    Special Requests   Final    BOTTLES DRAWN AEROBIC AND ANAEROBIC Blood Culture adequate volume Performed at [facility], [address], [place], [place] [address]    Culture  Setup Time   Final    GRAM POSITIVE COCCI ANAEROBIC BOTTLE ONLY CRITICAL RESULT CALLED TO, READ BACK BY AND VERIFIED WITH: PHARMD Ronal RAMAN on 012026 @1908  by SM    Culture (A)  Final    STAPHYLOCOCCUS EPIDERMIDIS THE SIGNIFICANCE OF ISOLATING THIS ORGANISM FROM A SINGLE SET OF BLOOD CULTURES WHEN MULTIPLE SETS ARE DRAWN IS UNCERTAIN. PLEASE NOTIFY THE MICROBIOLOGY DEPARTMENT WITHIN ONE WEEK IF SPECIATION AND SENSITIVITIES ARE REQUIRED. Performed at [facility], [address], [place], [place] [address]    Report Status 04/08/2024 FINAL  Final  Culture, blood (routine x 2)     Status: None (Preliminary result)   Collection Time: 04/05/24  5:58 PM   Specimen: BLOOD RIGHT HAND  Result Value Ref Range Status   Specimen Description   Final    BLOOD RIGHT HAND Performed at [facility], [address], [place], [place] [address]    Special Requests   Final    BOTTLES DRAWN AEROBIC AND ANAEROBIC Blood Culture adequate volume Performed at [facility], [address] [place], [place] [address]    Culture   Final    NO GROWTH 4 DAYS Performed at [facility], [address], [place], [place] [address]    Report Status PENDING  Incomplete  Blood Culture ID Panel (Reflexed)     Status: Abnormal   Collection Time: 04/05/24  5:58 PM  Result Value Ref Range Status   Enterococcus faecalis NOT DETECTED NOT DETECTED Final    Enterococcus Faecium NOT DETECTED NOT DETECTED Final   Listeria monocytogenes NOT DETECTED NOT DETECTED Final   Staphylococcus species DETECTED (A) NOT DETECTED Final    Comment: CRITICAL RESULT CALLED TO, READ BACK BY AND VERIFIED WITH: PHARMD Ronal RAMAN on 012026 @1908  by SM    Staphylococcus aureus (BCID) NOT DETECTED NOT DETECTED Final   Staphylococcus epidermidis DETECTED (A) NOT DETECTED Final    Comment: Methicillin (oxacillin) resistant coagulase negative staphylococcus. Possible blood culture contaminant (unless isolated from more than one blood culture draw or clinical case suggests pathogenicity). No antibiotic treatment is indicated for blood  culture contaminants. CRITICAL RESULT CALLED TO, READ BACK BY AND VERIFIED WITH: PHARMD Ronal RAMAN on 012026 @1908  by SM    Staphylococcus lugdunensis NOT DETECTED NOT DETECTED Final   Streptococcus species NOT DETECTED NOT DETECTED Final   Streptococcus agalactiae NOT DETECTED NOT DETECTED Final   Streptococcus pneumoniae NOT DETECTED NOT DETECTED Final   Streptococcus pyogenes NOT DETECTED NOT DETECTED Final   A.calcoaceticus-baumannii NOT DETECTED NOT  DETECTED Final   Bacteroides fragilis NOT DETECTED NOT DETECTED Final   Enterobacterales NOT DETECTED NOT DETECTED Final   Enterobacter cloacae complex NOT DETECTED NOT DETECTED Final   Escherichia coli NOT DETECTED NOT DETECTED Final   Klebsiella aerogenes NOT DETECTED NOT DETECTED Final   Klebsiella oxytoca NOT DETECTED NOT DETECTED Final   Klebsiella pneumoniae NOT DETECTED NOT DETECTED Final   Proteus species NOT DETECTED NOT DETECTED Final   Salmonella species NOT DETECTED NOT DETECTED Final   Serratia marcescens NOT DETECTED NOT DETECTED Final   Haemophilus influenzae NOT DETECTED NOT DETECTED Final   Neisseria meningitidis NOT DETECTED NOT DETECTED Final   Pseudomonas aeruginosa NOT DETECTED NOT DETECTED Final   Stenotrophomonas maltophilia NOT DETECTED NOT DETECTED Final   Candida  albicans NOT DETECTED NOT DETECTED Final   Candida auris NOT DETECTED NOT DETECTED Final   Candida glabrata NOT DETECTED NOT DETECTED Final   Candida krusei NOT DETECTED NOT DETECTED Final   Candida parapsilosis NOT DETECTED NOT DETECTED Final   Candida tropicalis NOT DETECTED NOT DETECTED Final   Cryptococcus neoformans/gattii NOT DETECTED NOT DETECTED Final   Methicillin resistance mecA/C DETECTED (A) NOT DETECTED Final    Comment: CRITICAL RESULT CALLED TO, READ BACK BY AND VERIFIED WITH: MAYA Ronal RAMAN on 012026 @1908  by SM Performed at [facility], [address], [place], [place] [address]   MRSA Next Gen by PCR, Nasal     Status: None   Collection Time: 04/05/24  6:52 PM   Specimen: Nasal Mucosa; Nasal Swab  Result Value Ref Range Status   MRSA by PCR Next Gen NOT DETECTED NOT DETECTED Final    Comment: (NOTE) The GeneXpert MRSA Assay (FDA approved for NASAL specimens only), is one component of a comprehensive MRSA colonization surveillance program. It is not intended to diagnose MRSA infection nor to guide or monitor treatment for MRSA infections. Test performance is not FDA approved in patients less than 39 years old. Performed at [facility], [address], [place], [place] [address]      Labs: BNP (last 3 results) Recent Labs    06/10/23 1315  BNP 292.4*   Basic Metabolic Panel: Recent Labs  Lab 04/05/24 1608 04/06/24 0456 04/07/24 0411 04/08/24 0416  NA 141 142 144 141  K 5.3* 4.8 4.5 4.6  CL 102 103 107 105  CO2 31 33* 34* 27  GLUCOSE 143* 100* 85 116*  BUN 27* 25* 24* 23  CREATININE 0.92 0.95 0.82 0.78  CALCIUM  9.2 9.3 9.3 9.3   Liver Function Tests: No results for input(s): AST, ALT, ALKPHOS, BILITOT, PROT, ALBUMIN in the last 168 hours. No results for input(s): LIPASE, AMYLASE in the last 168 hours. No results for input(s): AMMONIA in the last 168 hours. CBC: Recent Labs  Lab 04/05/24 1608  04/06/24 0456 04/07/24 0411 04/08/24 0416  WBC 14.5* 14.0* 10.9* 13.8*  NEUTROABS 12.0*  --   --   --   HGB 10.3* 9.7* 8.6* 10.3*  HCT 34.7* 32.1* 29.3* 35.1*  MCV 96.9 97.0 98.7 98.6  PLT 262 255 194 205   Cardiac Enzymes: No results for input(s): CKTOTAL, CKMB, CKMBINDEX, TROPONINI in the last 168 hours. BNP: Invalid input(s): POCBNP CBG: No results for input(s): GLUCAP in the last 168 hours. D-Dimer No results for input(s): DDIMER in the last 72 hours. Hgb A1c No results for input(s): HGBA1C in the last 72 hours. Lipid Profile No results for input(s): CHOL, HDL, LDLCALC, TRIG, CHOLHDL,  LDLDIRECT in the last 72 hours. Thyroid  function studies No results for input(s): TSH, T4TOTAL, T3FREE, THYROIDAB in the last 72 hours.  Invalid input(s): FREET3 Anemia work up No results for input(s): VITAMINB12, FOLATE, FERRITIN, TIBC, IRON, RETICCTPCT in the last 72 hours. Urinalysis    Component Value Date/Time   COLORURINE YELLOW 02/25/2024 1111   APPEARANCEUR HAZY (A) 02/25/2024 1111   LABSPEC 1.015 02/25/2024 1111   PHURINE 6.0 02/25/2024 1111   GLUCOSEU NEGATIVE 02/25/2024 1111   GLUCOSEU NEGATIVE 01/11/2008 1526   HGBUR SMALL (A) 02/25/2024 1111   BILIRUBINUR NEGATIVE 02/25/2024 1111   BILIRUBINUR neg 08/29/2021 1301   KETONESUR 5 (A) 02/25/2024 1111   PROTEINUR 100 (A) 02/25/2024 1111   UROBILINOGEN 1.0 08/29/2021 1301   UROBILINOGEN 0.2 mg/dL 89/73/7990 8473   NITRITE NEGATIVE 02/25/2024 1111   LEUKOCYTESUR SMALL (A) 02/25/2024 1111   Sepsis Labs Recent Labs  Lab 04/05/24 1608 04/06/24 0456 04/07/24 0411 04/08/24 0416  WBC 14.5* 14.0* 10.9* 13.8*   Microbiology Recent Results (from the past 240 hours)  Culture, blood (routine x 2)     Status: Abnormal   Collection Time: 04/05/24  5:58 PM   Specimen: BLOOD LEFT HAND  Result Value Ref Range Status   Specimen Description   Final    BLOOD LEFT HAND Performed at  [facility], [address], [place], [place] [address]    Special Requests   Final    BOTTLES DRAWN AEROBIC AND ANAEROBIC Blood Culture adequate volume Performed at [facility], [address] [place], [place] [address]    Culture  Setup Time   Final    GRAM POSITIVE COCCI ANAEROBIC BOTTLE ONLY CRITICAL RESULT CALLED TO, READ BACK BY AND VERIFIED WITH: PHARMD Ronal RAMAN on 012026 @1908  by SM    Culture (A)  Final    STAPHYLOCOCCUS EPIDERMIDIS THE SIGNIFICANCE OF ISOLATING THIS ORGANISM FROM A SINGLE SET OF BLOOD CULTURES WHEN MULTIPLE SETS ARE DRAWN IS UNCERTAIN. PLEASE NOTIFY THE MICROBIOLOGY DEPARTMENT WITHIN ONE WEEK IF SPECIATION AND SENSITIVITIES ARE REQUIRED. Performed at [facility], [address], [place], [place] [address]    Report Status 04/08/2024 FINAL  Final  Culture, blood (routine x 2)     Status: None (Preliminary result)   Collection Time: 04/05/24  5:58 PM   Specimen: BLOOD RIGHT HAND  Result Value Ref Range Status   Specimen Description   Final    BLOOD RIGHT HAND Performed at [facility], [address], [place], [place] [address]    Special Requests   Final    BOTTLES DRAWN AEROBIC AND ANAEROBIC Blood Culture adequate volume Performed at [facility], [address], [place], [place] [address]    Culture   Final    NO GROWTH 4 DAYS Performed at [facility], [address] [place], [place] [address]    Report Status PENDING  Incomplete  Blood Culture ID Panel (Reflexed)     Status: Abnormal   Collection Time: 04/05/24  5:58 PM  Result Value Ref Range Status   Enterococcus faecalis NOT DETECTED NOT DETECTED Final   Enterococcus Faecium NOT DETECTED NOT DETECTED Final   Listeria monocytogenes NOT DETECTED NOT DETECTED Final   Staphylococcus species DETECTED (A) NOT DETECTED Final    Comment: CRITICAL RESULT CALLED TO, READ BACK BY AND VERIFIED WITH: PHARMD Ronal RAMAN on 012026 @1908  by SM     Staphylococcus aureus (BCID) NOT DETECTED NOT DETECTED Final  Staphylococcus epidermidis DETECTED (A) NOT DETECTED Final    Comment: Methicillin (oxacillin) resistant coagulase negative staphylococcus. Possible blood culture contaminant (unless isolated from more than one blood culture draw or clinical case suggests pathogenicity). No antibiotic treatment is indicated for blood  culture contaminants. CRITICAL RESULT CALLED TO, READ BACK BY AND VERIFIED WITH: PHARMD Ronal RAMAN on 012026 @1908  by SM    Staphylococcus lugdunensis NOT DETECTED NOT DETECTED Final   Streptococcus species NOT DETECTED NOT DETECTED Final   Streptococcus agalactiae NOT DETECTED NOT DETECTED Final   Streptococcus pneumoniae NOT DETECTED NOT DETECTED Final   Streptococcus pyogenes NOT DETECTED NOT DETECTED Final   A.calcoaceticus-baumannii NOT DETECTED NOT DETECTED Final   Bacteroides fragilis NOT DETECTED NOT DETECTED Final   Enterobacterales NOT DETECTED NOT DETECTED Final   Enterobacter cloacae complex NOT DETECTED NOT DETECTED Final   Escherichia coli NOT DETECTED NOT DETECTED Final   Klebsiella aerogenes NOT DETECTED NOT DETECTED Final   Klebsiella oxytoca NOT DETECTED NOT DETECTED Final   Klebsiella pneumoniae NOT DETECTED NOT DETECTED Final   Proteus species NOT DETECTED NOT DETECTED Final   Salmonella species NOT DETECTED NOT DETECTED Final   Serratia marcescens NOT DETECTED NOT DETECTED Final   Haemophilus influenzae NOT DETECTED NOT DETECTED Final   Neisseria meningitidis NOT DETECTED NOT DETECTED Final   Pseudomonas aeruginosa NOT DETECTED NOT DETECTED Final   Stenotrophomonas maltophilia NOT DETECTED NOT DETECTED Final   Candida albicans NOT DETECTED NOT DETECTED Final   Candida auris NOT DETECTED NOT DETECTED Final   Candida glabrata NOT DETECTED NOT DETECTED Final   Candida krusei NOT DETECTED NOT DETECTED Final   Candida parapsilosis NOT DETECTED NOT DETECTED Final   Candida tropicalis NOT DETECTED  NOT DETECTED Final   Cryptococcus neoformans/gattii NOT DETECTED NOT DETECTED Final   Methicillin resistance mecA/C DETECTED (A) NOT DETECTED Final    Comment: CRITICAL RESULT CALLED TO, READ BACK BY AND VERIFIED WITH: MAYA Ronal RAMAN on 012026 @1908  by SM Performed at [facility], [address], [place], [place] [address]   MRSA Next Gen by PCR, Nasal     Status: None   Collection Time: 04/05/24  6:52 PM   Specimen: Nasal Mucosa; Nasal Swab  Result Value Ref Range Status   MRSA by PCR Next Gen NOT DETECTED NOT DETECTED Final    Comment: (NOTE) The GeneXpert MRSA Assay (FDA approved for NASAL specimens only), is one component of a comprehensive MRSA colonization surveillance program. It is not intended to diagnose MRSA infection nor to guide or monitor treatment for MRSA infections. Test performance is not FDA approved in patients less than 18 years old. Performed at [facility], [address], [place], [place] [address]      Patient was seen and examined on the day of discharge and was found to be in stable condition. Time coordinating discharge: 25 minutes including assessment and coordination of care, as well as examination of the patient.   SIGNED:  Delon Hoe, DO [facility] 04/09/2024, 1:54 PM       [1]  Allergies Allergen Reactions   Oxycodone  Itching and Other (See Comments)    Hallucinations   Statins Other (See Comments)    Muscle pain   Advair Diskus [Fluticasone -Salmeterol] Other (See Comments)    Mouth sores   Atorvastatin  Other (See Comments)    Severe headache   Sulfonamide Derivatives Rash

## 2024-04-10 LAB — CULTURE, BLOOD (ROUTINE X 2)
Culture: NO GROWTH
Special Requests: ADEQUATE

## 2024-04-12 ENCOUNTER — Telehealth: Payer: Self-pay

## 2024-04-12 ENCOUNTER — Telehealth: Payer: Self-pay | Admitting: Family Medicine

## 2024-04-12 DIAGNOSIS — J189 Pneumonia, unspecified organism: Secondary | ICD-10-CM

## 2024-04-12 NOTE — Transitions of Care (Post Inpatient/ED Visit) (Signed)
 "  04/12/2024  Name: [name] MRN: [id number] DOB: 1929-09-01  Today's TOC FU Call Status: Today's TOC FU Call Status:: Successful TOC FU Call Completed TOC FU Call Complete Date: 04/12/24  Patient's Name and Date of Birth confirmed. Name, DOB  Transition Care Management Follow-up Telephone Call Date of Discharge: 04/09/24 Discharge Facility: [facility]) Type of Discharge: Inpatient Admission Primary Inpatient Discharge Diagnosis:: HCAP How have you been since you were released from the hospital?: Better Any questions or concerns?: No  Items Reviewed: Did you receive and understand the discharge instructions provided?: Yes Medications obtained,verified, and reconciled?: Yes (Medications Reviewed) Any new allergies since your discharge?: No Dietary orders reviewed?: Yes Type of Diet Ordered:: low sodium heart healthy diet. Do you have support at home?: Yes People in Home [RPT]: child(ren), adult Name of Support/Comfort Primary Source: [name]  Medications Reviewed Today: Medications Reviewed Today     Reviewed by Rumalda Alan PENNER, RN (Registered Nurse) on 04/12/24 at 1139  Med List Status: <None>   Medication Order Taking? Sig Documenting Provider Last Dose Status Informant  acetaminophen  (TYLENOL ) 500 MG tablet [id number] Yes Take 1 tablet (500 mg total) by mouth 3 (three) times daily.  Patient taking differently: Take 500 mg by mouth in the morning and at bedtime.   Regalado, Belkys A, MD  Active Family Member  albuterol  (PROVENTIL ) (2.5 MG/3ML) 0.083% nebulizer solution [id number] Yes Take 3 mLs (2.5 mg total) by nebulization every 6 (six) hours as needed for wheezing or shortness of breath.  Patient taking differently: Take 2.5 mg by nebulization in the morning and at bedtime.   Wendolyn Jenkins Jansky, MD  Active Family Member  albuterol  (VENTOLIN  HFA) 108 (90 Base) MCG/ACT inhaler [id number] Yes Inhale 2 puffs into the lungs every 4 (four) hours as needed for wheezing or  shortness of breath. [provider]  Active Family Member  ARIPiprazole  (ABILIFY ) 2 MG tablet [id number] Yes TAKE 1 TABLET BY MOUTH EVERY DAY *NEW PRESCRIPTION REQUEST*  Patient taking differently: Take 2 mg by mouth in the morning.   Wendolyn Jenkins Jansky, MD  Active Family Member  ASPIRIN  LOW DOSE 81 MG tablet [id number] Yes TAKE 1 TABLET BY MOUTH EVERY DAY *NEW PRESCRIPTION REQUEST*  Patient taking differently: Take 81 mg by mouth in the morning.   Wendolyn Jenkins Jansky, MD  Active Family Member  budesonide  (PULMICORT ) 1 MG/2ML nebulizer solution [id number] Yes INHALE THE CONTENTS OF 1 VIAL VIA NEBULIZER TWICE DAILY *NEW PRESCRIPTION REQUEST*  Patient taking differently: Take 1 mg by nebulization in the morning and at bedtime.   Wendolyn Jenkins Jansky, MD  Active Family Member  buPROPion  (WELLBUTRIN  XL) 150 MG 24 hr tablet [id number] Yes Take 1 tablet (150 mg total) by mouth in the morning. Wendolyn Jenkins Jansky, MD  Active Family Member  CALCIUM  PO [id number] Yes Take 1 tablet by mouth daily. [provider]  Active Family Member  carvedilol  (COREG ) 6.25 MG tablet [id number]  Take 1 tablet (6.25 mg total) by mouth 2 (two) times daily with a meal.  Patient not taking: Reported on 04/12/2024   Christobal Guadalajara, MD  Active Family Member  Cholecalciferol  (VITAMIN D3) 50 MCG (2000 UT) capsule [id number] Yes Take 2,000 Units by mouth daily. [provider]  Active Family Member  cyanocobalamin  (VITAMIN B12) 1000 MCG/ML injection [id number] Yes Inject 1 mL (1,000 mcg total) into the muscle every 30 (thirty) days. Wendolyn Jenkins Jansky, MD  Active Family Member  dextromethorphan  15 MG/5ML syrup [id number] Yes Take  10 mLs (30 mg total) by mouth 4 (four) times daily as needed for cough. Rojelio Nest, DO  Active   diazepam  (VALIUM ) 5 MG tablet [id number] Yes Take 1 tablet (5 mg total) by mouth See admin instructions. Take 5 mg by mouth in the evening AS NEEDED for restlessness and an additional 5 mg once a day as  needed for anxiety Wendolyn Jenkins Jansky, MD  Active Family Member  docusate sodium  (COLACE) 100 MG capsule [id number] Yes Take 1 capsule (100 mg total) by mouth daily.  Patient taking differently: Take 100 mg by mouth at bedtime.   Wendolyn Jenkins Jansky, MD  Active Family Member  estradiol (ESTRACE) 0.1 MG/GM vaginal cream [id number] Yes Place 1 Applicatorful vaginally daily as needed (burning). [provider]  Active Family Member  ferrous sulfate  325 (65 FE) MG tablet [id number] Yes Take 1 tablet (325 mg total) by mouth daily with breakfast. Christobal Guadalajara, MD  Active Family Member  folic acid  (FOLVITE ) 1 MG tablet [id number] Yes Take 1 tablet (1 mg total) by mouth daily. Wendolyn Jenkins Jansky, MD  Active Family Member  HYDROcodone -acetaminophen  (NORCO/VICODIN) 5-325 MG tablet [id number] Yes Take 1 tablet by mouth every 6 (six) hours as needed for moderate pain (pain score 4-6). Wendolyn Jenkins Jansky, MD  Active Family Member  memantine  (NAMENDA ) 5 MG tablet [id number] Yes Take 1 tablet (5 mg total) by mouth in the morning and at bedtime. Wendolyn Jenkins Jansky, MD  Active Family Member  omega-3 acid ethyl esters (LOVAZA ) 1 g capsule [id number] Yes Take 2 capsules (2 g total) by mouth 2 (two) times daily. Wendolyn Jenkins Jansky, MD  Active Family Member  omeprazole  [facility]) 40 MG capsule [id number] Yes TAKE ONE (1) CAPSULE BY MOUTH TWICE DAILY *NEW PRESCRIPTION REQUEST*  Patient taking differently: Take 40 mg by mouth 2 (two) times daily before a meal.   Wendolyn Jenkins Jansky, MD  Active Family Member  ondansetron  (ZOFRAN -ODT) 8 MG disintegrating tablet [id number] Yes Take 1 tablet (8 mg total) by mouth every 8 (eight) hours as needed for nausea or vomiting. PLACE 1 TABLET ON TONGUE EVERY 8 HOURS AS NEEDED FOR NAUSEA OR VOMITING  Patient taking differently: Take 8 mg by mouth every 8 (eight) hours as needed for nausea or vomiting (dissolve orally).   Wendolyn Jenkins Jansky, MD  Active Family Member  OXYGEN  [id number] Yes Inhale 2 L/min into  the lungs continuous. [provider]  Active Family Member  polyethylene glycol (MIRALAX  / GLYCOLAX ) 17 g packet [id number] Yes Take 17 g by mouth daily.  Patient taking differently: Take 5 g by mouth in the morning.   Wendolyn Jenkins Jansky, MD  Active Family Member  predniSONE  (DELTASONE ) 5 MG tablet [id number] Yes Take 1 tablet (5 mg total) by mouth at bedtime. Resume after finished with prednisone  taper from hospitalization  Patient taking differently: Take 5 mg by mouth daily with breakfast.   Evonnie Lenis, MD  Active Family Member           Med Note MARISA, NATHANEL LOISE Kitchens Apr 05, 2024  7:58 PM) ONGOING THERAPY  Probiotic CHEW [id number] Yes Chew 1 each by mouth every other day. Wendolyn Jenkins Jansky, MD  Active Family Member  Probiotic Product (PROBIOTIC PO) [id number] Yes Take 1 capsule by mouth every Monday, Wednesday, and Friday. [provider]  Active Family Member  rOPINIRole  (REQUIP ) 1 MG tablet [id number] Yes Take 1 tablet (1 mg total) by mouth as directed. TAKE 1 TABLET IN THE  MORNING AND 2 TABLETS IN THE EVENING  Patient taking differently: Take 1-2 mg by mouth See admin instructions. Take 1 mg by mouth in the morning and 2 mg in the evening   Wendolyn Jenkins Jansky, MD  Active Family Member  sacubitril -valsartan  (ENTRESTO ) 24-26 MG [id number] Yes Take 1 tablet by mouth 2 (two) times daily. Christobal Guadalajara, MD  Active Family Member  sertraline  (ZOLOFT ) 50 MG tablet [id number] Yes Take 1 tablet (50 mg total) by mouth in the morning. Wendolyn Jenkins Jansky, MD  Active Family Member  spironolactone  (ALDACTONE ) 25 MG tablet [id number] Yes Take 0.5 tablets (12.5 mg total) by mouth daily. Christobal Guadalajara, MD  Active Family Member  SYSTANE 0.4-0.3 % SOLN [id number] Yes Place 1 drop into both eyes 4 (four) times daily as needed (for dryness). Wendolyn Jenkins Jansky, MD  Active Family Member  Tiotropium Bromide  (SPIRIVA  RESPIMAT) 2.5 MCG/ACT AERS [id number] Yes Inhale 2 Inhalations into the lungs daily at 12 noon. Wendolyn Jenkins Jansky, MD  Active Family Member  tocilizumab  4 mg/kg in sodium chloride  0.9 % [id number] Yes Inject 280 mg into the vein every 28 (twenty-eight) days. [provider]  Active Family Member           Med Note MARISA, NATHANEL LOISE Kitchens Apr 05, 2024  7:19 PM)    Med List Note Lorne Been, CPhT 03/23/24 1345): Donzell (daughter) handles medications.             Home Care and Equipment/Supplies: Any new equipment or medical supplies ordered?: No  Functional Questionnaire: Do you need assistance with bathing/showering or dressing?: Yes Do you need assistance with meal preparation?: Yes Do you need assistance with eating?: No Do you have difficulty maintaining continence: Yes Do you need assistance with getting out of bed/getting out of a chair/moving?: Yes Do you have difficulty managing or taking your medications?: Yes  Follow up appointments reviewed: PCP Follow-up appointment confirmed?: No (daughter to call and schedule hospital follow up appointment) MD Provider Line Number:[phone] Given: No Specialist Hospital Follow-up appointment confirmed?: Yes Do you need transportation to your follow-up appointment?: No Do you understand care options if your condition(s) worsen?: Yes-patient verbalized understanding  SDOH Interventions Today    Flowsheet Row Most Recent Value  SDOH Interventions   Food Insecurity Interventions Intervention Not Indicated  Housing Interventions Intervention Not Indicated  Transportation Interventions Intervention Not Indicated  Utilities Interventions Intervention Not Indicated   Placed call to patient and spoke with daughter [name].  Daughter reports that she has all patient instructions and patient is doing well. Still has a cough. Daughter gave mucinex  last night.  Daughter denied wanting to review discharge instructions - reports good understanding. Daughter reports that she wanted to talk to someone about getting assistance with care in  the home. Referral placed for SW.  Reviewed importance of follow up with PCP.  TOC not completed and assessments not completed per decline of daughter.   Alan Ee, RN, BSN, CEN Population Health- Transition of Care Team.  [facility] [phone]   "

## 2024-04-12 NOTE — Progress Notes (Signed)
 Complex Care Management Note  Care Guide Note 04/12/2024 Name: [name] MRN: [id number] DOB: 1929-05-23  [name] is a 89 y.o. year old female who sees Wendolyn Jenkins Jansky, MD for primary care. I reached out to Conception Behar by phone today to offer complex care management services.  [name] was given information about Complex Care Management services today including:   The Complex Care Management services include support from the care team which includes your Nurse Care Manager, Clinical Social Worker, or Pharmacist.  The Complex Care Management team is here to help remove barriers to the health concerns and goals most important to you. Complex Care Management services are voluntary, and the patient may decline or stop services at any time by request to their care team member.   Complex Care Management Consent Status: Patient agreed to services and verbal consent obtained.   Follow up plan:  Telephone appointment with complex care management team member scheduled for:  05/03/2024  Encounter Outcome:  Patient Scheduled  Doyce Razor [facility]  [facility], [facility] Guide Direct Dial: [phone]  Fax: [phone]

## 2024-04-13 ENCOUNTER — Other Ambulatory Visit: Payer: Self-pay

## 2024-04-13 ENCOUNTER — Ambulatory Visit: Payer: Self-pay

## 2024-04-13 ENCOUNTER — Inpatient Hospital Stay (HOSPITAL_COMMUNITY)
Admission: EM | Admit: 2024-04-13 | Discharge: 2024-04-15 | DRG: 291 | Disposition: A | Discharge status: Home Health | Attending: Family Medicine | Admitting: Family Medicine

## 2024-04-13 ENCOUNTER — Emergency Department (HOSPITAL_COMMUNITY)

## 2024-04-13 ENCOUNTER — Encounter (HOSPITAL_COMMUNITY): Payer: Self-pay

## 2024-04-13 DIAGNOSIS — J9611 Chronic respiratory failure with hypoxia: Secondary | ICD-10-CM | POA: Diagnosis present

## 2024-04-13 DIAGNOSIS — Z6828 Body mass index (BMI) 28.0-28.9, adult: Secondary | ICD-10-CM

## 2024-04-13 DIAGNOSIS — J45909 Unspecified asthma, uncomplicated: Secondary | ICD-10-CM | POA: Diagnosis present

## 2024-04-13 DIAGNOSIS — I5043 Acute on chronic combined systolic (congestive) and diastolic (congestive) heart failure: Secondary | ICD-10-CM

## 2024-04-13 DIAGNOSIS — Z7952 Long term (current) use of systemic steroids: Secondary | ICD-10-CM | POA: Diagnosis not present

## 2024-04-13 DIAGNOSIS — G2581 Restless legs syndrome: Secondary | ICD-10-CM | POA: Diagnosis present

## 2024-04-13 DIAGNOSIS — I10 Essential (primary) hypertension: Secondary | ICD-10-CM | POA: Diagnosis not present

## 2024-04-13 DIAGNOSIS — Z7951 Long term (current) use of inhaled steroids: Secondary | ICD-10-CM

## 2024-04-13 DIAGNOSIS — K279 Peptic ulcer, site unspecified, unspecified as acute or chronic, without hemorrhage or perforation: Secondary | ICD-10-CM | POA: Diagnosis present

## 2024-04-13 DIAGNOSIS — D509 Iron deficiency anemia, unspecified: Secondary | ICD-10-CM | POA: Diagnosis present

## 2024-04-13 DIAGNOSIS — D508 Other iron deficiency anemias: Secondary | ICD-10-CM | POA: Diagnosis not present

## 2024-04-13 DIAGNOSIS — M315 Giant cell arteritis with polymyalgia rheumatica: Secondary | ICD-10-CM | POA: Diagnosis present

## 2024-04-13 DIAGNOSIS — K760 Fatty (change of) liver, not elsewhere classified: Secondary | ICD-10-CM | POA: Diagnosis present

## 2024-04-13 DIAGNOSIS — E785 Hyperlipidemia, unspecified: Secondary | ICD-10-CM | POA: Diagnosis present

## 2024-04-13 DIAGNOSIS — Z885 Allergy status to narcotic agent status: Secondary | ICD-10-CM

## 2024-04-13 DIAGNOSIS — F0393 Unspecified dementia, unspecified severity, with mood disturbance: Secondary | ICD-10-CM | POA: Diagnosis present

## 2024-04-13 DIAGNOSIS — Z801 Family history of malignant neoplasm of trachea, bronchus and lung: Secondary | ICD-10-CM

## 2024-04-13 DIAGNOSIS — N1831 Chronic kidney disease, stage 3a: Secondary | ICD-10-CM | POA: Diagnosis present

## 2024-04-13 DIAGNOSIS — F0394 Unspecified dementia, unspecified severity, with anxiety: Secondary | ICD-10-CM | POA: Diagnosis present

## 2024-04-13 DIAGNOSIS — Z888 Allergy status to other drugs, medicaments and biological substances status: Secondary | ICD-10-CM

## 2024-04-13 DIAGNOSIS — M316 Other giant cell arteritis: Secondary | ICD-10-CM | POA: Diagnosis present

## 2024-04-13 DIAGNOSIS — Z9981 Dependence on supplemental oxygen: Secondary | ICD-10-CM

## 2024-04-13 DIAGNOSIS — Z79899 Other long term (current) drug therapy: Secondary | ICD-10-CM

## 2024-04-13 DIAGNOSIS — I5023 Acute on chronic systolic (congestive) heart failure: Secondary | ICD-10-CM | POA: Diagnosis present

## 2024-04-13 DIAGNOSIS — Z882 Allergy status to sulfonamides status: Secondary | ICD-10-CM

## 2024-04-13 DIAGNOSIS — K219 Gastro-esophageal reflux disease without esophagitis: Secondary | ICD-10-CM | POA: Diagnosis present

## 2024-04-13 DIAGNOSIS — Z833 Family history of diabetes mellitus: Secondary | ICD-10-CM

## 2024-04-13 DIAGNOSIS — F32A Depression, unspecified: Secondary | ICD-10-CM | POA: Diagnosis present

## 2024-04-13 DIAGNOSIS — Z85828 Personal history of other malignant neoplasm of skin: Secondary | ICD-10-CM

## 2024-04-13 DIAGNOSIS — Z825 Family history of asthma and other chronic lower respiratory diseases: Secondary | ICD-10-CM

## 2024-04-13 DIAGNOSIS — I130 Hypertensive heart and chronic kidney disease with heart failure and stage 1 through stage 4 chronic kidney disease, or unspecified chronic kidney disease: Principal | ICD-10-CM | POA: Diagnosis present

## 2024-04-13 DIAGNOSIS — M797 Fibromyalgia: Secondary | ICD-10-CM | POA: Diagnosis present

## 2024-04-13 DIAGNOSIS — Z8249 Family history of ischemic heart disease and other diseases of the circulatory system: Secondary | ICD-10-CM

## 2024-04-13 DIAGNOSIS — E890 Postprocedural hypothyroidism: Secondary | ICD-10-CM | POA: Diagnosis present

## 2024-04-13 DIAGNOSIS — Z7982 Long term (current) use of aspirin: Secondary | ICD-10-CM

## 2024-04-13 DIAGNOSIS — Z66 Do not resuscitate: Secondary | ICD-10-CM | POA: Diagnosis present

## 2024-04-13 DIAGNOSIS — E66811 Obesity, class 1: Secondary | ICD-10-CM | POA: Diagnosis present

## 2024-04-13 DIAGNOSIS — Z823 Family history of stroke: Secondary | ICD-10-CM

## 2024-04-13 DIAGNOSIS — M353 Polymyalgia rheumatica: Secondary | ICD-10-CM | POA: Diagnosis not present

## 2024-04-13 DIAGNOSIS — M810 Age-related osteoporosis without current pathological fracture: Secondary | ICD-10-CM | POA: Diagnosis present

## 2024-04-13 DIAGNOSIS — Z1152 Encounter for screening for COVID-19: Secondary | ICD-10-CM

## 2024-04-13 DIAGNOSIS — F418 Other specified anxiety disorders: Secondary | ICD-10-CM | POA: Diagnosis present

## 2024-04-13 DIAGNOSIS — Z90710 Acquired absence of both cervix and uterus: Secondary | ICD-10-CM

## 2024-04-13 DIAGNOSIS — R160 Hepatomegaly, not elsewhere classified: Secondary | ICD-10-CM | POA: Diagnosis present

## 2024-04-13 LAB — TROPONIN T, HIGH SENSITIVITY
Troponin T High Sensitivity: 81 ng/L — ABNORMAL HIGH (ref 0–19)
Troponin T High Sensitivity: 86 ng/L — ABNORMAL HIGH (ref 0–19)

## 2024-04-13 LAB — CBC WITH DIFFERENTIAL/PLATELET
Abs Immature Granulocytes: 0.06 10*3/uL (ref 0.00–0.07)
Basophils Absolute: 0.1 10*3/uL (ref 0.0–0.1)
Basophils Relative: 0 %
Eosinophils Absolute: 0.1 10*3/uL (ref 0.0–0.5)
Eosinophils Relative: 0 %
HCT: 37.4 % (ref 36.0–46.0)
Hemoglobin: 11.3 g/dL — ABNORMAL LOW (ref 12.0–15.0)
Immature Granulocytes: 0 %
Lymphocytes Relative: 16 %
Lymphs Abs: 2.5 10*3/uL (ref 0.7–4.0)
MCH: 29 pg (ref 26.0–34.0)
MCHC: 30.2 g/dL (ref 30.0–36.0)
MCV: 96.1 fL (ref 80.0–100.0)
Monocytes Absolute: 0.7 10*3/uL (ref 0.1–1.0)
Monocytes Relative: 4 %
Neutro Abs: 12.6 10*3/uL — ABNORMAL HIGH (ref 1.7–7.7)
Neutrophils Relative %: 80 %
Platelets: 274 10*3/uL (ref 150–400)
RBC: 3.89 MIL/uL (ref 3.87–5.11)
RDW: 16.1 % — ABNORMAL HIGH (ref 11.5–15.5)
WBC: 16 10*3/uL — ABNORMAL HIGH (ref 4.0–10.5)
nRBC: 0 % (ref 0.0–0.2)

## 2024-04-13 LAB — PRO BRAIN NATRIURETIC PEPTIDE: Pro Brain Natriuretic Peptide: 32576 pg/mL — ABNORMAL HIGH

## 2024-04-13 LAB — COMPREHENSIVE METABOLIC PANEL WITH GFR
ALT: 9 U/L (ref 0–44)
AST: 27 U/L (ref 15–41)
Albumin: 3.6 g/dL (ref 3.5–5.0)
Alkaline Phosphatase: 121 U/L (ref 38–126)
Anion gap: 15 (ref 5–15)
BUN: 21 mg/dL (ref 8–23)
CO2: 28 mmol/L (ref 22–32)
Calcium: 9.7 mg/dL (ref 8.9–10.3)
Chloride: 106 mmol/L (ref 98–111)
Creatinine, Ser: 0.84 mg/dL (ref 0.44–1.00)
GFR, Estimated: >60 mL/min
Glucose, Bld: 141 mg/dL — ABNORMAL HIGH (ref 70–99)
Potassium: 4.8 mmol/L (ref 3.5–5.1)
Sodium: 149 mmol/L — ABNORMAL HIGH (ref 135–145)
Total Bilirubin: 0.4 mg/dL (ref 0.0–1.2)
Total Protein: 6.6 g/dL (ref 6.5–8.1)

## 2024-04-13 LAB — RESP PANEL BY RT-PCR (RSV, FLU A&B, COVID)  RVPGX2
Influenza A by PCR: NEGATIVE
Influenza B by PCR: NEGATIVE
Resp Syncytial Virus by PCR: NEGATIVE
SARS Coronavirus 2 by RT PCR: NEGATIVE

## 2024-04-13 MED ORDER — POLYETHYLENE GLYCOL 3350 17 G PO PACK: 17.0000 g | PACK | Freq: Every day | ORAL | Status: DC | PRN

## 2024-04-13 MED ORDER — BUPROPION HCL ER (XL) 150 MG PO TB24
150.0000 mg | ORAL_TABLET | Freq: Every morning | ORAL | Status: DC
  Administered 2024-04-14 – 2024-04-15 (×2): 150 mg via ORAL
  Filled 2024-04-13 (×2): qty 1

## 2024-04-13 MED ORDER — ALBUTEROL SULFATE (2.5 MG/3ML) 0.083% IN NEBU
2.5000 mg | INHALATION_SOLUTION | Freq: Once | RESPIRATORY_TRACT | Status: AC
  Administered 2024-04-13: 2.5 mg via RESPIRATORY_TRACT
  Filled 2024-04-13: qty 3

## 2024-04-13 MED ORDER — ALBUTEROL SULFATE (2.5 MG/3ML) 0.083% IN NEBU
INHALATION_SOLUTION | RESPIRATORY_TRACT | Status: AC
  Administered 2024-04-13: 2.5 mg
  Filled 2024-04-13: qty 3

## 2024-04-13 MED ORDER — ALBUTEROL SULFATE (2.5 MG/3ML) 0.083% IN NEBU
2.5000 mg | INHALATION_SOLUTION | Freq: Two times a day (BID) | RESPIRATORY_TRACT | Status: DC
  Administered 2024-04-14 – 2024-04-15 (×3): 2.5 mg via RESPIRATORY_TRACT
  Filled 2024-04-13 (×3): qty 3

## 2024-04-13 MED ORDER — PANTOPRAZOLE SODIUM 40 MG PO TBEC
40.0000 mg | DELAYED_RELEASE_TABLET | Freq: Every day | ORAL | Status: DC
  Administered 2024-04-13 – 2024-04-15 (×3): 40 mg via ORAL
  Filled 2024-04-13 (×3): qty 1

## 2024-04-13 MED ORDER — FUROSEMIDE 10 MG/ML IJ SOLN: 20.0000 mg | Freq: Two times a day (BID) | INTRAMUSCULAR | Status: DC

## 2024-04-13 MED ORDER — ROPINIROLE HCL 1 MG PO TABS
1.0000 mg | ORAL_TABLET | Freq: Every day | ORAL | Status: DC
  Administered 2024-04-14 – 2024-04-15 (×2): 1 mg via ORAL
  Filled 2024-04-13 (×2): qty 1

## 2024-04-13 MED ORDER — UMECLIDINIUM BROMIDE 62.5 MCG/ACT IN AEPB
1.0000 | INHALATION_SPRAY | Freq: Every day | RESPIRATORY_TRACT | Status: DC
  Administered 2024-04-14 – 2024-04-15 (×2): 1 via RESPIRATORY_TRACT
  Filled 2024-04-13: qty 7

## 2024-04-13 MED ORDER — FERROUS SULFATE 325 (65 FE) MG PO TABS
325.0000 mg | ORAL_TABLET | Freq: Every day | ORAL | Status: DC
  Administered 2024-04-14 – 2024-04-15 (×2): 325 mg via ORAL
  Filled 2024-04-13 (×2): qty 1

## 2024-04-13 MED ORDER — SERTRALINE HCL 50 MG PO TABS
50.0000 mg | ORAL_TABLET | Freq: Every day | ORAL | Status: DC
  Administered 2024-04-14 – 2024-04-15 (×2): 50 mg via ORAL
  Filled 2024-04-13 (×2): qty 1

## 2024-04-13 MED ORDER — TIOTROPIUM BROMIDE 2.5 MCG/ACT IN AERS: 2.0000 | INHALATION_SPRAY | Freq: Every day | RESPIRATORY_TRACT | Status: DC

## 2024-04-13 MED ORDER — SACUBITRIL-VALSARTAN 24-26 MG PO TABS
1.0000 | ORAL_TABLET | Freq: Two times a day (BID) | ORAL | Status: DC
  Administered 2024-04-13 – 2024-04-15 (×4): 1 via ORAL
  Filled 2024-04-13 (×4): qty 1

## 2024-04-13 MED ORDER — ENOXAPARIN SODIUM 40 MG/0.4ML IJ SOSY
40.0000 mg | PREFILLED_SYRINGE | INTRAMUSCULAR | Status: DC
  Administered 2024-04-13 – 2024-04-14 (×2): 40 mg via SUBCUTANEOUS
  Filled 2024-04-13 (×2): qty 0.4

## 2024-04-13 MED ORDER — VITAMIN D 25 MCG (1000 UNIT) PO TABS
2000.0000 [IU] | ORAL_TABLET | Freq: Every day | ORAL | Status: DC
  Administered 2024-04-14 – 2024-04-15 (×2): 2000 [IU] via ORAL
  Filled 2024-04-13 (×2): qty 2

## 2024-04-13 MED ORDER — ASPIRIN 81 MG PO TBEC
81.0000 mg | DELAYED_RELEASE_TABLET | Freq: Every morning | ORAL | Status: DC
  Administered 2024-04-14 – 2024-04-15 (×2): 81 mg via ORAL
  Filled 2024-04-13 (×2): qty 1

## 2024-04-13 MED ORDER — FUROSEMIDE 10 MG/ML IJ SOLN
20.0000 mg | Freq: Two times a day (BID) | INTRAMUSCULAR | Status: DC
  Administered 2024-04-14 – 2024-04-15 (×2): 20 mg via INTRAVENOUS
  Filled 2024-04-13 (×3): qty 2

## 2024-04-13 MED ORDER — PREDNISONE 5 MG PO TABS
5.0000 mg | ORAL_TABLET | Freq: Every day | ORAL | Status: DC
  Administered 2024-04-14 – 2024-04-15 (×2): 5 mg via ORAL
  Filled 2024-04-13 (×2): qty 1

## 2024-04-13 MED ORDER — FOLIC ACID 1 MG PO TABS
1.0000 mg | ORAL_TABLET | Freq: Every day | ORAL | Status: DC
  Administered 2024-04-13 – 2024-04-15 (×3): 1 mg via ORAL
  Filled 2024-04-13 (×3): qty 1

## 2024-04-13 MED ORDER — ARIPIPRAZOLE 2 MG PO TABS
2.0000 mg | ORAL_TABLET | Freq: Every morning | ORAL | Status: DC
  Administered 2024-04-14 – 2024-04-15 (×2): 2 mg via ORAL
  Filled 2024-04-13 (×4): qty 1

## 2024-04-13 MED ORDER — ROPINIROLE HCL 1 MG PO TABS
2.0000 mg | ORAL_TABLET | Freq: Every day | ORAL | Status: DC
  Administered 2024-04-14: 2 mg via ORAL
  Filled 2024-04-13: qty 2

## 2024-04-13 MED ORDER — FUROSEMIDE 10 MG/ML IJ SOLN
40.0000 mg | Freq: Once | INTRAMUSCULAR | Status: AC
  Administered 2024-04-13: 40 mg via INTRAVENOUS
  Filled 2024-04-13: qty 4

## 2024-04-13 MED ORDER — SODIUM CHLORIDE 0.9% FLUSH
3.0000 mL | Freq: Two times a day (BID) | INTRAVENOUS | Status: DC
  Administered 2024-04-14 – 2024-04-15 (×2): 3 mL via INTRAVENOUS

## 2024-04-13 MED ORDER — ACETAMINOPHEN 325 MG PO TABS: 650.0000 mg | ORAL_TABLET | Freq: Four times a day (QID) | ORAL | Status: DC | PRN

## 2024-04-13 MED ORDER — POLYVINYL ALCOHOL 1.4 % OP SOLN: 1.0000 [drp] | Freq: Four times a day (QID) | OPHTHALMIC | Status: DC | PRN

## 2024-04-13 MED ORDER — ACETAMINOPHEN 650 MG RE SUPP: 650.0000 mg | Freq: Four times a day (QID) | RECTAL | Status: DC | PRN

## 2024-04-13 MED ORDER — DIAZEPAM 5 MG PO TABS
5.0000 mg | ORAL_TABLET | Freq: Two times a day (BID) | ORAL | Status: DC | PRN
  Filled 2024-04-13: qty 1

## 2024-04-13 MED ORDER — OMEGA-3-ACID ETHYL ESTERS 1 G PO CAPS
2.0000 | ORAL_CAPSULE | Freq: Two times a day (BID) | ORAL | Status: DC
  Administered 2024-04-13 – 2024-04-15 (×4): 2 g via ORAL
  Filled 2024-04-13 (×4): qty 2

## 2024-04-13 MED ORDER — BUDESONIDE 0.5 MG/2ML IN SUSP
1.0000 mg | Freq: Two times a day (BID) | RESPIRATORY_TRACT | Status: DC
  Administered 2024-04-13 – 2024-04-15 (×4): 1 mg via RESPIRATORY_TRACT
  Filled 2024-04-13 (×4): qty 4

## 2024-04-13 MED ORDER — MEMANTINE HCL 10 MG PO TABS
5.0000 mg | ORAL_TABLET | Freq: Two times a day (BID) | ORAL | Status: DC
  Administered 2024-04-13 – 2024-04-15 (×4): 5 mg via ORAL
  Filled 2024-04-13 (×4): qty 1

## 2024-04-13 MED ORDER — SPIRONOLACTONE 12.5 MG HALF TABLET
12.5000 mg | ORAL_TABLET | Freq: Every day | ORAL | Status: DC
  Administered 2024-04-13 – 2024-04-15 (×2): 12.5 mg via ORAL
  Filled 2024-04-13 (×3): qty 1

## 2024-04-13 MED ORDER — DOCUSATE SODIUM 100 MG PO CAPS
100.0000 mg | ORAL_CAPSULE | Freq: Every day | ORAL | Status: DC
  Administered 2024-04-13 – 2024-04-14 (×2): 100 mg via ORAL
  Filled 2024-04-13 (×2): qty 1

## 2024-04-13 NOTE — Hospital Course (Signed)
 [name] is a 89 yo female with PMH new recent diagnosis CHF, GCA, HTN, CKD3a, anemia, dementia who again presented with recurrent shortness of breath.  She was initially hospitalized 03/22/2024 until 03/29/2024 and diagnosed with new systolic CHF and GDMT was slowly started.  She had a mild bump in creatinine and Lasix  was held briefly however does not appear to have been prescribed at discharge.  She again presented back on 1/19 with shortness of breath.  Was treated for CHF and presumed pneumonia at that time with antibiotics as well.  Still no prescription for Lasix  at discharge.  Her daughter also reports that she notices the patient becomes very fatigued and tired when giving her Coreg  and when withholding it, patient regains her energy.  She has therefore continued to hold the Coreg  recently.  She again presents back with shortness of breath and increased work of breathing. CXR shows worsening of bibasilar opacities and left midlung airspace disease with small left effusion. Daughter also reports weight gain at home and increased abdominal distention.  Lower extremity edema is about the same.  Initial troponin 81 which is similar to prior values from recent hospitalizations.  She denies any chest pain as well. proBNP 32,576, which is very mildly improved from recent hospitalizations when they were greater than 35,000.  She was given dose of Lasix  in the ER and admitted for ongoing IV diuresis.   A&P:  Acute on chronic systolic CHF - Diagnosed early January 2026 and evaluated by cardiology at that time and was started on GDMT - Echo 35 to 40%, global hypokinesis.  Normal RV systolic function - Per last cardiology note she is to be on Lasix , spironolactone , Entresto , Coreg .  No SGLT2 inhibitor due to limited mobility and difficulty controlling urine - Discussed with daughter that it is reasonable to hold Coreg  if causing noticeable decrease in energy especially at advanced age with quality  of life being more important at this time - She will need a prescription for Lasix  at discharge - Continuing on IV Lasix  for now given increased WOB on admission - monitor I&O - daily standing weight on scale  - Holding Coreg  as noted above; continue Entresto  and spironolactone   Chronic hypoxic respiratory failure - On 2 L oxygen  at baseline  HTN - Continue Entresto  and spironolactone  -Continue Lasix    Giant cell arteritis/polymyalgia rheumatica Chronically on Prednisone  5 mg,on Actemra  iv q28d- last one in Nov - was held due to being on antibiotics-next appointment in February.  CKD stage IIIa - has returned to baseline with what looks like 0.8-1 and GFR technically >60 - watch creat on lasix ; bump last admissions may have just been approaching baseline   Normocytic anemia Iron deficiency anemia: - Hemoglobin stable.  Baseline around 10 to 11 g/dL - Continue daily oral iron   Dementia Anxiety/Depression RLS Continue home regimen Abilify , bupropion , sertraline , Requip , PRN Valium  along with her Namenda    Liver mass/circumferential wall thickening of the transverse colon Noted on prior CT imaging of 12/30/2023. Findings on CT concerning for primary neoplasm or metastatic disease. OF NOTE patient and family noted to have deferred workup as she would not want to pursue any oncological treatment.   Class I Obesity w/ Body mass index is 28.59 kg/m Will benefit with PCP follow-up, weight loss

## 2024-04-13 NOTE — Plan of Care (Signed)

## 2024-04-13 NOTE — H&P (Signed)
 " History and Physical    [name]  FMW:[id number]  DOB: 10-08-1929  DOA: 04/13/2024  PCP: Wendolyn Jenkins Jansky, MD Patient coming from: Home  Chief Complaint: Shortness of breath  HPI:  Ms. Spraker is a 89 yo female with PMH new recent diagnosis CHF, GCA, HTN, CKD3a, anemia, dementia who again presented with recurrent shortness of breath.  She was initially hospitalized 03/22/2024 until 03/29/2024 and diagnosed with new systolic CHF and GDMT was slowly started.  She had a mild bump in creatinine and Lasix  was held briefly however does not appear to have been prescribed at discharge.  She again presented back on 1/19 with shortness of breath.  Was treated for CHF and presumed pneumonia at that time with antibiotics as well.  Still no prescription for Lasix  at discharge.  Her daughter also reports that she notices the patient becomes very fatigued and tired when giving her Coreg  and when withholding it, patient regains her energy.  She has therefore continued to hold the Coreg  recently.  She again presents back with shortness of breath and increased work of breathing. CXR shows worsening of bibasilar opacities and left midlung airspace disease with small left effusion. Daughter also reports weight gain at home and increased abdominal distention.  Lower extremity edema is about the same.  Initial troponin 81 which is similar to prior values from recent hospitalizations.  She denies any chest pain as well. proBNP 32,576, which is very mildly improved from recent hospitalizations when they were greater than 35,000.  She was given dose of Lasix  in the ER and admitted for ongoing IV diuresis.   A&P:  Acute on chronic systolic CHF - Diagnosed early January 2026 and evaluated by cardiology at that time and was started on GDMT - Echo 35 to 40%, global hypokinesis.  Normal RV systolic function - Per last cardiology note she is to be on Lasix , spironolactone , Entresto , Coreg .  No SGLT2 inhibitor  due to limited mobility and difficulty controlling urine - Discussed with daughter that it is reasonable to hold Coreg  if causing noticeable decrease in energy especially at advanced age with quality of life being more important at this time - She will need a prescription for Lasix  at discharge - Continuing on IV Lasix  for now given increased WOB on admission - monitor I&O - daily standing weight on scale  - Holding Coreg  as noted above; continue Entresto  and spironolactone   Chronic hypoxic respiratory failure - On 2 L oxygen  at baseline  HTN - Continue Entresto  and spironolactone  -Continue Lasix    Giant cell arteritis/polymyalgia rheumatica Chronically on Prednisone  5 mg,on Actemra  iv q28d- last one in Nov - was held due to being on antibiotics-next appointment in February.  CKD stage IIIa - has returned to baseline with what looks like 0.8-1 and GFR technically >60 - watch creat on lasix ; bump last admissions may have just been approaching baseline   Normocytic anemia Iron deficiency anemia: - Hemoglobin stable.  Baseline around 10 to 11 g/dL - Continue daily oral iron   Dementia Anxiety/Depression RLS Continue home regimen Abilify , bupropion , sertraline , Requip , PRN Valium  along with her Namenda    Liver mass/circumferential wall thickening of the transverse colon Noted on prior CT imaging of 12/30/2023. Findings on CT concerning for primary neoplasm or metastatic disease. OF NOTE patient and family noted to have deferred workup as she would not want to pursue any oncological treatment.   Class I Obesity w/ Body mass index is 28.59 kg/m Will benefit with PCP follow-up,  weight loss  I have personally briefly reviewed patient's old medical records in [facility] and discussed patient with the ER provider when appropriate/indicated.    Code Status:     Code Status: Limited: Do not attempt resuscitation (DNR) -DNR-LIMITED -Do Not Intubate/DNI   DVT Prophylaxis:  Lovenox      Anticipated disposition is to: Home  History: Past Medical History:  Diagnosis Date   Abdominal bloating    Abnormal LFTs (liver function tests)    Ankle fracture, bimalleolar, closed, right, initial encounter    Anxiety disorder    Arthritis    Asthma    Bimalleolar ankle fracture, right, closed, initial encounter 01/06/2019   Depression    Diverticulosis    Edema    Fatty liver    Fibromyalgia    GERD (gastroesophageal reflux disease)    Giant cell arteritis (HCC)    No biopsy secondary to steroid use   Hx of adenomatous colonic polyps    Hyperlipidemia    Hypertension    IBS (irritable bowel syndrome)    Lumbar radiculitis    Osteoporosis 09/2017   T score -2.6 left femoral neck   Peptic ulcer disease    Skin cancer    shoulder, leg, right eye (? squamous by resection)   Thoracic radiculitis    Thyroid  nodule    removed   Urinary incontinence    Vertigo     Past Surgical History:  Procedure Laterality Date   ABDOMINAL HYSTERECTOMY     BSO   BACK SURGERY  2005   lower back   EYE SURGERY Right 2006   LIVER BIOPSY     fatty liver   NASAL SEPTUM SURGERY     ORIF ANKLE FRACTURE Right 01/11/2019   Procedure: OPEN REDUCTION INTERNAL FIXATION (ORIF) RIGHT BIMALLEOLAR ANKLE FRACTURE;  Surgeon: Jerri Kay HERO, MD;  Location: [facility];  Service: Orthopedics;  Laterality: Right;   right knee surgery  2006   arthroscopy   right leg skin cancer surgery  2014   right shoulder skin cancer excision  2012   SKIN CANCER EXCISION     THYROIDECTOMY       reports that she has never smoked. She has never used smokeless tobacco. She reports that she does not drink alcohol  and does not use drugs.  Allergies[1]  Family History  Problem Relation Age of Onset   Colon polyps Maternal Aunt    Diabetes Father    Heart disease Father    Heart disease Mother    Lung disease Mother    COPD Mother    Lung cancer Brother    Cancer Brother         lung   COPD Brother    Stroke Brother    Lung cancer Sister    COPD Sister    Kidney disease Other        niece-mat side   Arthritis Other    Diabetes Son    Arthritis Daughter    Colon cancer Neg Hx    Home Medications: Prior to Admission medications  Medication Sig Start Date End Date Taking? Authorizing Provider  acetaminophen  (TYLENOL ) 500 MG tablet Take 1 tablet (500 mg total) by mouth 3 (three) times daily. Patient taking differently: Take 500 mg by mouth in the morning and at bedtime. 01/03/24   Regalado, Belkys A, MD  albuterol  (PROVENTIL ) (2.5 MG/3ML) 0.083% nebulizer solution Take 3 mLs (2.5 mg total) by nebulization every 6 (six) hours as needed  for wheezing or shortness of breath. Patient taking differently: Take 2.5 mg by nebulization in the morning and at bedtime. 04/05/24   Wendolyn Jenkins Jansky, MD  albuterol  (VENTOLIN  HFA) 108 [phone] Base) MCG/ACT inhaler Inhale 2 puffs into the lungs every 4 (four) hours as needed for wheezing or shortness of breath.    [provider]  ARIPiprazole  (ABILIFY ) 2 MG tablet TAKE 1 TABLET BY MOUTH EVERY DAY *NEW PRESCRIPTION REQUEST* Patient taking differently: Take 2 mg by mouth in the morning. 01/07/24   Wendolyn Jenkins Jansky, MD  ASPIRIN  LOW DOSE 81 MG tablet TAKE 1 TABLET BY MOUTH EVERY DAY *NEW PRESCRIPTION REQUEST* Patient taking differently: Take 81 mg by mouth in the morning. 01/07/24   Wendolyn Jenkins Jansky, MD  budesonide  (PULMICORT ) 1 MG/2ML nebulizer solution INHALE THE CONTENTS OF 1 VIAL VIA NEBULIZER TWICE DAILY *NEW PRESCRIPTION REQUEST* Patient taking differently: Take 1 mg by nebulization in the morning and at bedtime. 01/07/24   Wendolyn Jenkins Jansky, MD  buPROPion  (WELLBUTRIN  XL) 150 MG 24 hr tablet Take 1 tablet (150 mg total) by mouth in the morning. 01/07/24   Wendolyn Jenkins Jansky, MD  CALCIUM  PO Take 1 tablet by mouth daily.    [provider]  [Paused] carvedilol  (COREG ) 6.25 MG tablet Take 1 tablet (6.25 mg total) by mouth 2  (two) times daily with a meal. Patient not taking: Reported on 04/12/2024 Wait to take this until your doctor or other care provider tells you to start again. 03/29/24 04/28/24  Christobal Guadalajara, MD  Cholecalciferol  (VITAMIN D3) 50 MCG (2000 UT) capsule Take 2,000 Units by mouth daily.    [provider]  cyanocobalamin  (VITAMIN B12) 1000 MCG/ML injection Inject 1 mL (1,000 mcg total) into the muscle every 30 (thirty) days. 01/07/24   Wendolyn Jenkins Jansky, MD  dextromethorphan  15 MG/5ML syrup Take 10 mLs (30 mg total) by mouth 4 (four) times daily as needed for cough. 04/09/24   Rojelio Nest, DO  diazepam  (VALIUM ) 5 MG tablet Take 1 tablet (5 mg total) by mouth See admin instructions. Take 5 mg by mouth in the evening AS NEEDED for restlessness and an additional 5 mg once a day as needed for anxiety 01/13/24   Wendolyn Jenkins Jansky, MD  docusate sodium  (COLACE) 100 MG capsule Take 1 capsule (100 mg total) by mouth daily. Patient taking differently: Take 100 mg by mouth at bedtime. 01/07/24   Wendolyn Jenkins Jansky, MD  estradiol (ESTRACE) 0.1 MG/GM vaginal cream Place 1 Applicatorful vaginally daily as needed (burning). 03/30/21   [provider]  ferrous sulfate  325 (65 FE) MG tablet Take 1 tablet (325 mg total) by mouth daily with breakfast. 03/30/24 04/29/24  Christobal Guadalajara, MD  folic acid  (FOLVITE ) 1 MG tablet Take 1 tablet (1 mg total) by mouth daily. 03/03/24   Wendolyn Jenkins Jansky, MD  HYDROcodone -acetaminophen  (NORCO/VICODIN) 5-325 MG tablet Take 1 tablet by mouth every 6 (six) hours as needed for moderate pain (pain score 4-6). 03/03/24   Wendolyn Jenkins Jansky, MD  memantine  (NAMENDA ) 5 MG tablet Take 1 tablet (5 mg total) by mouth in the morning and at bedtime. 01/07/24   Wendolyn Jenkins Jansky, MD  omega-3 acid ethyl esters (LOVAZA ) 1 g capsule Take 2 capsules (2 g total) by mouth 2 (two) times daily. 01/22/24   Wendolyn Jenkins Jansky, MD  omeprazole  (PRILOSEC) 40 MG capsule TAKE ONE (1) CAPSULE BY MOUTH TWICE DAILY *NEW  PRESCRIPTION REQUEST* Patient taking differently: Take 40 mg by mouth 2 (  two) times daily before a meal. 01/07/24   Wendolyn Jenkins Jansky, MD  ondansetron  (ZOFRAN -ODT) 8 MG disintegrating tablet Take 1 tablet (8 mg total) by mouth every 8 (eight) hours as needed for nausea or vomiting. PLACE 1 TABLET ON TONGUE EVERY 8 HOURS AS NEEDED FOR NAUSEA OR VOMITING Patient taking differently: Take 8 mg by mouth every 8 (eight) hours as needed for nausea or vomiting (dissolve orally). 01/13/24   Wendolyn Jenkins Jansky, MD  OXYGEN  Inhale 2 L/min into the lungs continuous.    [provider]  polyethylene glycol (MIRALAX  / GLYCOLAX ) 17 g packet Take 17 g by mouth daily. Patient taking differently: Take 5 g by mouth in the morning. 01/13/24   Wendolyn Jenkins Jansky, MD  predniSONE  (DELTASONE ) 5 MG tablet Take 1 tablet (5 mg total) by mouth at bedtime. Resume after finished with prednisone  taper from hospitalization Patient taking differently: Take 5 mg by mouth daily with breakfast. 02/28/24   Evonnie Lenis, MD  Probiotic CHEW Chew 1 each by mouth every other day. 01/07/24   Wendolyn Jenkins Jansky, MD  Probiotic Product (PROBIOTIC PO) Take 1 capsule by mouth every Monday, Wednesday, and Friday.    [provider]  rOPINIRole  (REQUIP ) 1 MG tablet Take 1 tablet (1 mg total) by mouth as directed. TAKE 1 TABLET IN THE MORNING AND 2 TABLETS IN THE EVENING Patient taking differently: Take 1-2 mg by mouth See admin instructions. Take 1 mg by mouth in the morning and 2 mg in the evening 01/22/24   Wendolyn Jenkins Jansky, MD  sacubitril -valsartan  (ENTRESTO ) 24-26 MG Take 1 tablet by mouth 2 (two) times daily. 03/29/24 04/28/24  Christobal Guadalajara, MD  sertraline  (ZOLOFT ) 50 MG tablet Take 1 tablet (50 mg total) by mouth in the morning. 01/07/24   Wendolyn Jenkins Jansky, MD  spironolactone  (ALDACTONE ) 25 MG tablet Take 0.5 tablets (12.5 mg total) by mouth daily. 03/30/24 04/29/24  Christobal Guadalajara, MD  SYSTANE 0.4-0.3 % SOLN Place 1 drop into both eyes 4 (four)  times daily as needed (for dryness). 01/13/24   Wendolyn Jenkins Jansky, MD  Tiotropium Bromide  (SPIRIVA  RESPIMAT) 2.5 MCG/ACT AERS Inhale 2 Inhalations into the lungs daily at 12 noon. 01/22/24   Wendolyn Jenkins Jansky, MD  tocilizumab  4 mg/kg in sodium chloride  0.9 % Inject 280 mg into the vein every 28 (twenty-eight) days. 12/03/16   [provider]    Review of Systems:  Review of Systems  Constitutional: Negative.   HENT: Negative.    Eyes: Negative.   Respiratory:  Positive for shortness of breath.   Cardiovascular:  Positive for leg swelling.  Gastrointestinal: Negative.   Genitourinary: Negative.   Musculoskeletal: Negative.   Skin: Negative.   Neurological: Negative.   Endo/Heme/Allergies: Negative.   Psychiatric/Behavioral: Negative.      Physical Exam:  Vitals:   04/13/24 1131 04/13/24 1138 04/13/24 1301  BP: (!) 181/76    Pulse: 98    Temp: 98.1 F (36.7 C)    TempSrc: Oral    SpO2: 90%  93%  Weight:  56.7 kg   Height:  5' (1.524 m)    Physical Exam   Labs on Admission:  I have personally reviewed following labs and imaging studies Results for orders placed or performed during the hospital encounter of 04/13/24 (from the past 24 hours)  Resp panel by RT-PCR (RSV, Flu A&B, Covid) Anterior Nasal Swab     Status: None   Collection Time: 04/13/24 11:50 AM   Specimen: Anterior Nasal  Swab  Result Value Ref Range   SARS Coronavirus 2 by RT PCR NEGATIVE NEGATIVE   Influenza A by PCR NEGATIVE NEGATIVE   Influenza B by PCR NEGATIVE NEGATIVE   Resp Syncytial Virus by PCR NEGATIVE NEGATIVE  Pro Brain natriuretic peptide     Status: Abnormal   Collection Time: 04/13/24 11:55 AM  Result Value Ref Range   Pro Brain Natriuretic Peptide 32,576.0 (H) <300.0 pg/mL  Comprehensive metabolic panel     Status: Abnormal   Collection Time: 04/13/24 11:55 AM  Result Value Ref Range   Sodium 149 (H) 135 - 145 mmol/L   Potassium 4.8 3.5 - 5.1 mmol/L   Chloride 106 98 - 111 mmol/L    CO2 28 22 - 32 mmol/L   Glucose, Bld 141 (H) 70 - 99 mg/dL   BUN 21 8 - 23 mg/dL   Creatinine, Ser 9.15 0.44 - 1.00 mg/dL   Calcium  9.7 8.9 - 10.3 mg/dL   Total Protein 6.6 6.5 - 8.1 g/dL   Albumin 3.6 3.5 - 5.0 g/dL   AST 27 15 - 41 U/L   ALT 9 0 - 44 U/L   Alkaline Phosphatase 121 38 - 126 U/L   Total Bilirubin 0.4 0.0 - 1.2 mg/dL   GFR, Estimated >39 >39 mL/min   Anion gap 15 5 - 15  Troponin T, High Sensitivity     Status: Abnormal   Collection Time: 04/13/24 11:55 AM  Result Value Ref Range   Troponin T High Sensitivity 81 (H) 0 - 19 ng/L  CBC with Differential     Status: Abnormal   Collection Time: 04/13/24 11:55 AM  Result Value Ref Range   WBC 16.0 (H) 4.0 - 10.5 K/uL   RBC 3.89 3.87 - 5.11 MIL/uL   Hemoglobin 11.3 (L) 12.0 - 15.0 g/dL   HCT 62.5 63.9 - 53.9 %   MCV 96.1 80.0 - 100.0 fL   MCH 29.0 26.0 - 34.0 pg   MCHC 30.2 30.0 - 36.0 g/dL   RDW 83.8 (H) 88.4 - 84.4 %   Platelets 274 150 - 400 K/uL   nRBC 0.0 0.0 - 0.2 %   Neutrophils Relative % 80 %   Neutro Abs 12.6 (H) 1.7 - 7.7 K/uL   Lymphocytes Relative 16 %   Lymphs Abs 2.5 0.7 - 4.0 K/uL   Monocytes Relative 4 %   Monocytes Absolute 0.7 0.1 - 1.0 K/uL   Eosinophils Relative 0 %   Eosinophils Absolute 0.1 0.0 - 0.5 K/uL   Basophils Relative 0 %   Basophils Absolute 0.1 0.0 - 0.1 K/uL   Immature Granulocytes 0 %   Abs Immature Granulocytes 0.06 0.00 - 0.07 K/uL     Radiological Exams on Admission: DG Chest Portable 1 View Result Date: 04/13/2024 CLINICAL DATA:  Shortness of breath, CHF, COPD EXAM: PORTABLE CHEST 1 VIEW COMPARISON:  04/05/2024, 12/30/2023 FINDINGS: Stable cardiac enlargement and prominent central vascularity. Slight worsening of ill-defined bibasilar and left midlung scattered airspace opacities, suspected combination of the areas of atelectasis and pneumonia. Small left effusion still present. Left hemidiaphragm is obscured. No pneumothorax. Trachea midline. Aorta atherosclerotic.  Osteopenia and degenerative changes of the spine. IMPRESSION: 1. Slight worsening of bibasilar and left midlung airspace opacities. 2. Persistent small left effusion. Electronically Signed   By: CHRISTELLA.  Shick M.D.   On: 04/13/2024 12:01   DG Chest Portable 1 View  Final Result      Consults called:  N/A  EKG: Independently  reviewed.  Ectopic atrial rhythm, rate 98  Alm Apo, MD [facility] 04/13/2024, 2:13 PM     [1]  Allergies Allergen Reactions   Oxycodone  Itching and Other (See Comments)    Hallucinations   Statins Other (See Comments)    Muscle pain   Advair Diskus [Fluticasone -Salmeterol] Other (See Comments)    Mouth sores   Atorvastatin  Other (See Comments)    Severe headache   Sulfonamide Derivatives Rash   "

## 2024-04-13 NOTE — Telephone Encounter (Signed)
 FYI Only or Action Required?: FYI only for provider: ED advised.  Patient was last seen in primary care on 04/05/2024 by Wendolyn Jenkins Jansky, MD.  Called Nurse Triage reporting Shortness of Breath.  Symptoms are: gradually worsening.  Triage Disposition: Call EMS 911 Now  Patient/caregiver understands and will follow disposition?: yes  Reason for Triage: Patient having shortness of breath, retaining fluid, getting over pneumonia in right lung - wants to know if patient should be on lasix  at home.  Reason for Disposition  SEVERE difficulty breathing (e.g., struggling for each breath, speaks in single words)  Answer Assessment - Initial Assessment Questions Per caller pt is speaking in single word sentences, recently hospitalized for PNA, states breathing is definitely worse. Caller agreeable to calling 911.  Protocols used: Breathing Difficulty-A-AH

## 2024-04-13 NOTE — ED Triage Notes (Signed)
 Pt arrived from [facility] for [facility], pt stated she feels fine but family request that she come. Pt was d/c from [place] last week for [facility]. Pt has slight edema in lower extremities. Pt has hx of CHF, COPD. Pt is using accessory muscle use.

## 2024-04-13 NOTE — ED Provider Notes (Signed)
 " [place] EMERGENCY DEPARTMENT AT [facility] Provider Note   CSN: [id number] Arrival date & time: 04/13/24  1121     Patient presents with: Shortness of Breath   [name] is a 89 y.o. female.   HPI 89 year old female presents with dyspnea.  History is initially primarily from EMS.  They report that patient was recently discharged from [place] long with a diagnosis of CHF.  However she was not diagnosed with furosemide  and family's primary concern with calling EMS today was to see if she could have a prescription for diuresis.  The patient has looked short of breath to EMS but has denied acute dyspnea today.  She has a continued cough.  There have been no fevers, chest pain, abdominal pain.  The patient does not feel like her legs are swollen.  She is on 2 L of oxygen  chronically.  She was put up to 4 L by EMS due to her perceived increased work of breathing.  Prior to Admission medications  Medication Sig Start Date End Date Taking? Authorizing Provider  acetaminophen  (TYLENOL ) 500 MG tablet Take 1 tablet (500 mg total) by mouth 3 (three) times daily. Patient taking differently: Take 500 mg by mouth in the morning and at bedtime. 01/03/24   Regalado, Belkys A, MD  albuterol  (PROVENTIL ) (2.5 MG/3ML) 0.083% nebulizer solution Take 3 mLs (2.5 mg total) by nebulization every 6 (six) hours as needed for wheezing or shortness of breath. Patient taking differently: Take 2.5 mg by nebulization in the morning and at bedtime. 04/05/24   Wendolyn Jenkins Jansky, MD  albuterol  (VENTOLIN  HFA) [phone] Base) MCG/ACT inhaler Inhale 2 puffs into the lungs every 4 (four) hours as needed for wheezing or shortness of breath.    [provider]  ARIPiprazole  (ABILIFY ) 2 MG tablet TAKE 1 TABLET BY MOUTH EVERY DAY *NEW PRESCRIPTION REQUEST* Patient taking differently: Take 2 mg by mouth in the morning. 01/07/24   Wendolyn Jenkins Jansky, MD  ASPIRIN  LOW DOSE 81 MG tablet TAKE 1 TABLET BY MOUTH EVERY DAY  *NEW PRESCRIPTION REQUEST* Patient taking differently: Take 81 mg by mouth in the morning. 01/07/24   Wendolyn Jenkins Jansky, MD  budesonide  (PULMICORT ) 1 MG/2ML nebulizer solution INHALE THE CONTENTS OF 1 VIAL VIA NEBULIZER TWICE DAILY *NEW PRESCRIPTION REQUEST* Patient taking differently: Take 1 mg by nebulization in the morning and at bedtime. 01/07/24   Wendolyn Jenkins Jansky, MD  buPROPion  (WELLBUTRIN  XL) 150 MG 24 hr tablet Take 1 tablet (150 mg total) by mouth in the morning. 01/07/24   Wendolyn Jenkins Jansky, MD  CALCIUM  PO Take 1 tablet by mouth daily.    [provider]  [Paused] carvedilol  (COREG ) 6.25 MG tablet Take 1 tablet (6.25 mg total) by mouth 2 (two) times daily with a meal. Patient not taking: Reported on 04/12/2024 Wait to take this until your doctor or other care provider tells you to start again. 03/29/24 04/28/24  Christobal Guadalajara, MD  Cholecalciferol  (VITAMIN D3) 50 MCG (2000 UT) capsule Take 2,000 Units by mouth daily.    [provider]  cyanocobalamin  (VITAMIN B12) 1000 MCG/ML injection Inject 1 mL (1,000 mcg total) into the muscle every 30 (thirty) days. 01/07/24   Wendolyn Jenkins Jansky, MD  dextromethorphan  15 MG/5ML syrup Take 10 mLs (30 mg total) by mouth 4 (four) times daily as needed for cough. 04/09/24   Rojelio Nest, DO  diazepam  (VALIUM ) 5 MG tablet Take 1 tablet (5 mg total) by mouth See  admin instructions. Take 5 mg by mouth in the evening AS NEEDED for restlessness and an additional 5 mg once a day as needed for anxiety 01/13/24   Wendolyn Jenkins Jansky, MD  docusate sodium  (COLACE) 100 MG capsule Take 1 capsule (100 mg total) by mouth daily. Patient taking differently: Take 100 mg by mouth at bedtime. 01/07/24   Wendolyn Jenkins Jansky, MD  estradiol (ESTRACE) 0.1 MG/GM vaginal cream Place 1 Applicatorful vaginally daily as needed (burning). 03/30/21   [provider]  ferrous sulfate  325 (65 FE) MG tablet Take 1 tablet (325 mg total) by mouth daily with breakfast. 03/30/24  04/29/24  Christobal Guadalajara, MD  folic acid  (FOLVITE ) 1 MG tablet Take 1 tablet (1 mg total) by mouth daily. 03/03/24   Wendolyn Jenkins Jansky, MD  HYDROcodone -acetaminophen  (NORCO/VICODIN) 5-325 MG tablet Take 1 tablet by mouth every 6 (six) hours as needed for moderate pain (pain score 4-6). 03/03/24   Wendolyn Jenkins Jansky, MD  memantine  (NAMENDA ) 5 MG tablet Take 1 tablet (5 mg total) by mouth in the morning and at bedtime. 01/07/24   Wendolyn Jenkins Jansky, MD  omega-3 acid ethyl esters (LOVAZA ) 1 g capsule Take 2 capsules (2 g total) by mouth 2 (two) times daily. 01/22/24   Wendolyn Jenkins Jansky, MD  omeprazole  (PRILOSEC) 40 MG capsule TAKE ONE (1) CAPSULE BY MOUTH TWICE DAILY *NEW PRESCRIPTION REQUEST* Patient taking differently: Take 40 mg by mouth 2 (two) times daily before a meal. 01/07/24   Wendolyn Jenkins Jansky, MD  ondansetron  (ZOFRAN -ODT) 8 MG disintegrating tablet Take 1 tablet (8 mg total) by mouth every 8 (eight) hours as needed for nausea or vomiting. PLACE 1 TABLET ON TONGUE EVERY 8 HOURS AS NEEDED FOR NAUSEA OR VOMITING Patient taking differently: Take 8 mg by mouth every 8 (eight) hours as needed for nausea or vomiting (dissolve orally). 01/13/24   Wendolyn Jenkins Jansky, MD  OXYGEN  Inhale 2 L/min into the lungs continuous.    [provider]  polyethylene glycol (MIRALAX  / GLYCOLAX ) 17 g packet Take 17 g by mouth daily. Patient taking differently: Take 5 g by mouth in the morning. 01/13/24   Wendolyn Jenkins Jansky, MD  predniSONE  (DELTASONE ) 5 MG tablet Take 1 tablet (5 mg total) by mouth at bedtime. Resume after finished with prednisone  taper from hospitalization Patient taking differently: Take 5 mg by mouth daily with breakfast. 02/28/24   Evonnie Lenis, MD  Probiotic CHEW Chew 1 each by mouth every other day. 01/07/24   Wendolyn Jenkins Jansky, MD  Probiotic Product (PROBIOTIC PO) Take 1 capsule by mouth every Monday, Wednesday, and Friday.    [provider]  rOPINIRole  (REQUIP ) 1 MG tablet Take 1 tablet (1 mg  total) by mouth as directed. TAKE 1 TABLET IN THE MORNING AND 2 TABLETS IN THE EVENING Patient taking differently: Take 1-2 mg by mouth See admin instructions. Take 1 mg by mouth in the morning and 2 mg in the evening 01/22/24   Wendolyn Jenkins Jansky, MD  sacubitril -valsartan  (ENTRESTO ) 24-26 MG Take 1 tablet by mouth 2 (two) times daily. 03/29/24 04/28/24  Christobal Guadalajara, MD  sertraline  (ZOLOFT ) 50 MG tablet Take 1 tablet (50 mg total) by mouth in the morning. 01/07/24   Wendolyn Jenkins Jansky, MD  spironolactone  (ALDACTONE ) 25 MG tablet Take 0.5 tablets (12.5 mg total) by mouth daily. 03/30/24 04/29/24  Christobal Guadalajara, MD  SYSTANE 0.4-0.3 % SOLN Place 1 drop into both eyes 4 (four) times daily as needed (for dryness). 01/13/24  Wendolyn Jenkins Jansky, MD  Tiotropium Bromide  (SPIRIVA  RESPIMAT) 2.5 MCG/ACT AERS Inhale 2 Inhalations into the lungs daily at 12 noon. 01/22/24   Wendolyn Jenkins Jansky, MD  tocilizumab  4 mg/kg in sodium chloride  0.9 % Inject 280 mg into the vein every 28 (twenty-eight) days. 12/03/16   [provider]    Allergies: Oxycodone , Statins, Advair diskus [fluticasone -salmeterol], Atorvastatin , and Sulfonamide derivatives    Review of Systems  Constitutional:  Negative for fever.  Respiratory:  Positive for cough and shortness of breath.   Cardiovascular:  Negative for chest pain and leg swelling.  Gastrointestinal:  Negative for abdominal pain.    Updated Vital Signs BP (!) 181/76 (BP Location: Left Arm)   Pulse 98   Temp 98.1 F (36.7 C) (Oral)   Ht 5' (1.524 m)   Wt 56.7 kg   SpO2 93%   BMI 24.41 kg/m   Physical Exam Vitals and nursing note reviewed.  Constitutional:      General: She is not in acute distress.    Appearance: She is well-developed. She is not ill-appearing or diaphoretic.  HENT:     Head: Normocephalic and atraumatic.  Cardiovascular:     Rate and Rhythm: Normal rate and regular rhythm.     Heart sounds: Normal heart sounds.  Pulmonary:     Effort: Pulmonary  effort is normal. Tachypnea present. No respiratory distress.     Breath sounds: Rales (mild, basilar) present.  Abdominal:     Palpations: Abdomen is soft.     Tenderness: There is no abdominal tenderness.  Musculoskeletal:     Comments: Perhaps trace edema in feet/ankles  Skin:    General: Skin is warm and dry.  Neurological:     Mental Status: She is alert.     (all labs ordered are listed, but only abnormal results are displayed) Labs Reviewed  PRO BRAIN NATRIURETIC PEPTIDE - Abnormal; Notable for the following components:      Result Value   Pro Brain Natriuretic Peptide 32,576.0 (*)    All other components within normal limits  COMPREHENSIVE METABOLIC PANEL WITH GFR - Abnormal; Notable for the following components:   Sodium 149 (*)    Glucose, Bld 141 (*)    All other components within normal limits  CBC WITH DIFFERENTIAL/PLATELET - Abnormal; Notable for the following components:   WBC 16.0 (*)    Hemoglobin 11.3 (*)    RDW 16.1 (*)    Neutro Abs 12.6 (*)    All other components within normal limits  TROPONIN T, HIGH SENSITIVITY - Abnormal; Notable for the following components:   Troponin T High Sensitivity 81 (*)    All other components within normal limits  RESP PANEL BY RT-PCR (RSV, FLU A&B, COVID)  [id number]  TROPONIN T, HIGH SENSITIVITY    EKG: EKG Interpretation Date/Time:  Tuesday April 13 2024 11:41:20 EST Ventricular Rate:  98 PR Interval:  104 QRS Duration:  140 QT Interval:  370 QTC Calculation: 473 R Axis:   103  Text Interpretation: Ectopic atrial rhythm Short PR interval Nonspecific intraventricular conduction delay similar to Mar 22 2024 Confirmed by Freddi Hamilton [phone]) on 04/13/2024 12:01:02 PM  Radiology: DG Chest Portable 1 View Result Date: 04/13/2024 CLINICAL DATA:  Shortness of breath, CHF, COPD EXAM: PORTABLE CHEST 1 VIEW COMPARISON:  04/05/2024, 12/30/2023 FINDINGS: Stable cardiac enlargement and prominent central vascularity. Slight  worsening of ill-defined bibasilar and left midlung scattered airspace opacities, suspected combination of the areas of atelectasis and pneumonia. Small  left effusion still present. Left hemidiaphragm is obscured. No pneumothorax. Trachea midline. Aorta atherosclerotic. Osteopenia and degenerative changes of the spine. IMPRESSION: 1. Slight worsening of bibasilar and left midlung airspace opacities. 2. Persistent small left effusion. Electronically Signed   By: CHRISTELLA.  Shick M.D.   On: 04/13/2024 12:01     Procedures   Medications Ordered in the ED  furosemide  (LASIX ) injection 40 mg (has no administration in time range)  albuterol  (PROVENTIL ) (2.5 MG/3ML) 0.083% nebulizer solution 2.5 mg (2.5 mg Nebulization Given 04/13/24 1301)                                    Medical Decision Making Amount and/or Complexity of Data Reviewed External Data Reviewed: notes. Labs: ordered.    Details: Troponin elevated but flat compared to previous.  Leukocytosis. Radiology: ordered.    Details: CHF ECG/medicine tests: ordered and independent interpretation performed.    Details: No change since earlier.  Risk Prescription drug management. Decision regarding hospitalization.   Patient presents with acute worsening of CHF.  This is a relatively new diagnosis but this is now her third hospitalization.  The patient was not sent home with diuresis the first time due to a mildly worsening creatinine.  It is unclear why she did not have any Lasix  on discharge last time.  Unfortunately, she is now requiring 3 L as opposed to her normal 2 and has increased work of breathing on exam.  She also has a little bit of worsening on her chest x-ray.  Her leukocytosis is a little worse but clinically this does not seem like pneumonia based on her presentation.  Will start some diuresis and give a breathing treatment.  Otherwise, I have discussed the case with hospitalist, Dr. Patsy, and also relayed familial concern about  not having diuresis at home.     Final diagnoses:  Acute on chronic systolic congestive heart failure [facility])    ED Discharge Orders     None          Freddi Hamilton, MD 04/13/24 1353  "

## 2024-04-14 ENCOUNTER — Other Ambulatory Visit: Payer: Self-pay

## 2024-04-14 DIAGNOSIS — M353 Polymyalgia rheumatica: Secondary | ICD-10-CM

## 2024-04-14 DIAGNOSIS — D508 Other iron deficiency anemias: Secondary | ICD-10-CM

## 2024-04-14 DIAGNOSIS — M316 Other giant cell arteritis: Secondary | ICD-10-CM

## 2024-04-14 DIAGNOSIS — I10 Essential (primary) hypertension: Secondary | ICD-10-CM

## 2024-04-14 DIAGNOSIS — K279 Peptic ulcer, site unspecified, unspecified as acute or chronic, without hemorrhage or perforation: Secondary | ICD-10-CM

## 2024-04-14 LAB — CBC WITH DIFFERENTIAL/PLATELET
Abs Immature Granulocytes: 0.04 10*3/uL (ref 0.00–0.07)
Basophils Absolute: 0 10*3/uL (ref 0.0–0.1)
Basophils Relative: 0 %
Eosinophils Absolute: 0 10*3/uL (ref 0.0–0.5)
Eosinophils Relative: 0 %
HCT: 33.7 % — ABNORMAL LOW (ref 36.0–46.0)
Hemoglobin: 10.2 g/dL — ABNORMAL LOW (ref 12.0–15.0)
Immature Granulocytes: 0 %
Lymphocytes Relative: 10 %
Lymphs Abs: 1 10*3/uL (ref 0.7–4.0)
MCH: 29 pg (ref 26.0–34.0)
MCHC: 30.3 g/dL (ref 30.0–36.0)
MCV: 95.7 fL (ref 80.0–100.0)
Monocytes Absolute: 0.6 10*3/uL (ref 0.1–1.0)
Monocytes Relative: 6 %
Neutro Abs: 8.7 10*3/uL — ABNORMAL HIGH (ref 1.7–7.7)
Neutrophils Relative %: 84 %
Platelets: 203 10*3/uL (ref 150–400)
RBC: 3.52 MIL/uL — ABNORMAL LOW (ref 3.87–5.11)
RDW: 15.9 % — ABNORMAL HIGH (ref 11.5–15.5)
WBC: 10.4 10*3/uL (ref 4.0–10.5)
nRBC: 0 % (ref 0.0–0.2)

## 2024-04-14 LAB — BASIC METABOLIC PANEL WITH GFR
Anion gap: 10 (ref 5–15)
BUN: 27 mg/dL — ABNORMAL HIGH (ref 8–23)
CO2: 29 mmol/L (ref 22–32)
Calcium: 9 mg/dL (ref 8.9–10.3)
Chloride: 103 mmol/L (ref 98–111)
Creatinine, Ser: 0.93 mg/dL (ref 0.44–1.00)
GFR, Estimated: 58 mL/min — ABNORMAL LOW
Glucose, Bld: 132 mg/dL — ABNORMAL HIGH (ref 70–99)
Potassium: 4.8 mmol/L (ref 3.5–5.1)
Sodium: 142 mmol/L (ref 135–145)

## 2024-04-14 LAB — MAGNESIUM: Magnesium: 2.3 mg/dL (ref 1.7–2.4)

## 2024-04-14 MED ORDER — DEXTROMETHORPHAN POLISTIREX ER 30 MG/5ML PO SUER
30.0000 mg | Freq: Two times a day (BID) | ORAL | Status: DC | PRN
  Administered 2024-04-14: 30 mg via ORAL
  Filled 2024-04-14: qty 5

## 2024-04-14 NOTE — TOC Initial Note (Signed)
 Transition of Care [facility]) - Initial/Assessment Note    Patient Details  Name: [name] MRN: [id number] Date of Birth: Jul 20, 1929  [facility]) CM/SW Contact:    Lucie Lunger, LCSWA Phone Number: 04/14/2024, 10:45 AM  Clinical Narrative:                 CSW notes pt is high risk for readmission. CSW spoke with pts daughter to complete assessment. Pt has family in the home at all hours of the day. Pt has assistance with ADLs and family provides transportation. Pt has all needed DME (wheelchair, BSC, walker, cane, O2 thru [employer]). PT is active with [facility] PT/OT/RN. TOC to follow.   Expected Discharge Plan: Home w Home Health Services Barriers to Discharge: Continued Medical Work up   Patient Goals and CMS Choice Patient states their goals for this hospitalization and ongoing recovery are:: return home CMS Medicare.gov Compare Post Acute Care list provided to:: Patient Represenative (must comment) Choice offered to / list presented to : Adult Children [place] ownership interest in [facility].provided to:: Adult Children    Expected Discharge Plan and Services In-house Referral: Clinical Social Work Discharge Planning Services: CM Consult Post Acute Care Choice: Home Health Living arrangements for the past 2 months: Single Family Home                           HH Arranged: RN, PT, OT HH Agency: [facility]        Prior Living Arrangements/Services Living arrangements for the past 2 months: Single Family Home Lives with:: Adult Children Patient language and need for interpreter reviewed:: Yes Do you feel safe going back to the place where you live?: Yes      Need for Family Participation in Patient Care: Yes (Comment) Care giver support system in place?: Yes (comment) Current home services: DME, Home OT, Home PT, Home RN Criminal Activity/Legal Involvement Pertinent to Current Situation/Hospitalization: No - Comment as  needed  Activities of Daily Living   ADL Screening (condition at time of admission) Independently performs ADLs?: No Does the patient have a NEW difficulty with bathing/dressing/toileting/self-feeding that is expected to last >3 days?: No Does the patient have a NEW difficulty with getting in/out of bed, walking, or climbing stairs that is expected to last >3 days?: No Does the patient have a NEW difficulty with communication that is expected to last >3 days?: No Is the patient deaf or have difficulty hearing?: Yes Does the patient have difficulty seeing, even when wearing glasses/contacts?: No Does the patient have difficulty concentrating, remembering, or making decisions?: Yes  Permission Sought/Granted                  Emotional Assessment Appearance:: Appears stated age Attitude/Demeanor/Rapport: Engaged Affect (typically observed): Accepting   Alcohol  / Substance Use: Not Applicable Psych Involvement: No (comment)  Admission diagnosis:  Acute on chronic systolic congestive heart failure (HCC) [I50.23] Acute on chronic systolic (congestive) heart failure (HCC) [I50.23] Patient Active Problem List   Diagnosis Date Noted   Acute on chronic systolic (congestive) heart failure (HCC) 04/13/2024   HCAP (healthcare-associated pneumonia) 04/07/2024   Acute systolic (congestive) heart failure (HCC) 04/05/2024   Acute on chronic combined systolic and diastolic CHF (congestive heart failure) (HCC) 03/25/2024   Pressure injury of skin 03/25/2024   Acute systolic heart failure (HCC) 03/23/2024   Acute on chronic respiratory failure with hypoxia (HCC) 03/22/2024  Pulmonary edema 03/22/2024   Pleural effusion on left 03/22/2024   Closed fracture of multiple pubic rami, left, initial encounter (HCC) 02/25/2024   Asthma, chronic obstructive, with acute exacerbation (HCC) 02/25/2024   Major neurocognitive disorder (HCC) 02/25/2024   Near syncope 12/30/2023   Hypotension 12/30/2023    Distal radius fracture, right 12/30/2023   Liver lesion 12/30/2023   UTI (urinary tract infection) 12/30/2023   Age-related cognitive decline 09/29/2022   RLS (restless legs syndrome) 08/30/2021   Chronic constipation 08/30/2021   Cervicalgia 08/30/2021   Immunocompromised 09/01/2020   Acute vulvitis 07/31/2018   Mixed stress and urge urinary incontinence 09/04/2017   Elevated hemoglobin 04/04/2017   Chronic back pain 01/28/2017   Peptic ulcer disease    Lumbar radiculitis    Frequent falls 08/02/2016   Hypertensive retinopathy of both eyes 09/07/2015   Medicare annual wellness visit, subsequent 07/11/2015   Long-term use of high-risk medication 07/11/2015   Osteopenia 04/11/2015   Depression with anxiety 04/11/2015   Giant cell arteritis (HCC) 03/28/2015   Polymyalgia rheumatica 03/16/2015   Chronic narcotic use 03/16/2015   Anemia, iron deficiency 07/14/2013   Asthma, moderate persistent 10/15/2012   Hypertriglyceridemia 12/27/2008   HYPERTENSION, BENIGN 12/27/2008   PCP:  Wendolyn Jenkins Jansky, MD Pharmacy:   [facility] [phone] - [place], [place] - [address] [place] [address] Phone: [phone] Fax: [phone]     Social Drivers of Health (SDOH) Social History: SDOH Screenings   Food Insecurity: No Food Insecurity (04/13/2024)  Housing: Low Risk (04/13/2024)  Transportation Needs: No Transportation Needs (04/13/2024)  Utilities: Patient Declined (04/13/2024)  Alcohol  Screen: Low Risk (08/26/2023)  Depression (PHQ2-9): Medium Risk (09/30/2023)  Financial Resource Strain: Low Risk (08/26/2023)  Physical Activity: Insufficiently Active (08/26/2023)  Social Connections: Patient Declined (04/13/2024)  Recent Concern: Social Connections - Socially Isolated (03/23/2024)  Stress: No Stress Concern Present (08/26/2023)  Tobacco Use: Low Risk (04/13/2024)  Health Literacy: Adequate Health Literacy (08/26/2023)   SDOH  Interventions:     Readmission Risk Interventions    04/14/2024   10:42 AM 03/24/2024   11:14 AM 02/28/2024   11:11 AM  Readmission Risk Prevention Plan  Transportation Screening Complete Complete Complete  HRI or Home Care Consult   Complete  Social Work Consult for Recovery Care Planning/Counseling   Complete  Palliative Care Screening   Not Applicable  Medication Review [employer]) Complete Complete Complete  HRI or Home Care Consult Complete Complete   SW Recovery Care/Counseling Consult Complete Complete   Palliative Care Screening Not Applicable Not Applicable   Skilled Nursing Facility Not Applicable Not Applicable

## 2024-04-14 NOTE — Progress Notes (Signed)
 " PROGRESS NOTE   [name]  FMW:[id number] DOB: 12/31/1929 DOA: 04/13/2024 PCP: Sally Jenkins Jansky, MD   Chief Complaint  Patient presents with   Shortness of Breath   Level of care: Telemetry  Brief Admission History:  [name] is a 89 yo female with PMH new recent diagnosis CHF, GCA, HTN, CKD3a, anemia, dementia who again presented with recurrent shortness of breath.  She was initially hospitalized 03/22/2024 until 03/29/2024 and diagnosed with new systolic CHF and GDMT was slowly started.  She had a mild bump in creatinine and Lasix  was held briefly however does not appear to have been prescribed at discharge.  She again presented back on 1/19 with shortness of breath.  Was treated for CHF and presumed pneumonia at that time with antibiotics as well.  Still no prescription for Lasix  at discharge.  Her daughter also reports that she notices the patient becomes very fatigued and tired when giving her Coreg  and when withholding it, patient regains her energy.  She has therefore continued to hold the Coreg  recently.  She again presents back with shortness of breath and increased work of breathing. CXR shows worsening of bibasilar opacities and left midlung airspace disease with small left effusion. Daughter also reports weight gain at home and increased abdominal distention.  Lower extremity edema is about the same.  Initial troponin 81 which is similar to prior values from recent hospitalizations.  She denies any chest pain as well. proBNP 32,576, which is very mildly improved from recent hospitalizations when they were greater than 35,000.  She was given dose of Lasix  in the ER and admitted for ongoing IV diuresis.    Assessment and Plan:  Acute HFrEF - Diagnosed early January 2026 and evaluated by cardiology at that time and was started on GDMT - Echo 35 to 40%, global hypokinesis.  Normal RV systolic function - Per last cardiology note she is to be on Lasix , spironolactone ,  Entresto , Coreg .  No SGLT2 inhibitor due to limited mobility and difficulty controlling urine - Discussed with daughter that it is reasonable to hold Coreg  if causing noticeable decrease in energy especially at advanced age with quality of life being more important at this time - Continuing on IV Lasix  - monitor I&O - daily standing weight on scale  - Holding Coreg  as noted above; continue Entresto  and spironolactone   Chronic hypoxic respiratory failure - On 2 L oxygen  at baseline  HTN - Continue Entresto  and spironolactone  -- Continue Lasix    Giant cell arteritis/polymyalgia rheumatica Chronically on Prednisone  5 mg,on Actemra  iv q28d- last one in Nov - held due to being on antibiotics-next appointment in February.  CKD stage IIIa - has returned to baseline with what looks like 0.8-1 and GFR technically >60 - watch creat on lasix ; bump last admissions may have just been approaching baseline   Normocytic anemia Iron deficiency anemia: - Hemoglobin stable.  Baseline around 10 to 11 g/dL - Continue daily oral iron   Dementia Anxiety/Depression RLS Continue home regimen Abilify , bupropion , sertraline , Requip , PRN Valium  along with her Namenda    Liver mass/circumferential wall thickening of the transverse colon Noted on prior CT imaging of 12/30/2023. Findings on CT concerning for primary neoplasm or metastatic disease. OF NOTE patient and family noted to have deferred workup as she would not want to pursue any oncological treatment.   Class I Obesity w/ Body mass index is 28.59 kg/m Will benefit with PCP follow-up, weight loss   DVT prophylaxis: enoxaparin  Code Status: [facility]  Communication: bedside updated 1/28 Disposition: anticipate DC tomorrow    Consultants:   Procedures:   Antimicrobials:    Subjective: Pt reporting that she is breathing better today.  No other specific complaints.   Objective: Vitals:   04/14/24 0756 04/14/24 0828 04/14/24 1014  04/14/24 1219  BP:   (!) 116/40 (!) 140/52  Pulse:   80 78  Resp:    20  Temp:   98.1 F (36.7 C) 98.9 F (37.2 C)  TempSrc:   Oral Oral  SpO2: 100% 99% 98% 99%  Weight:      Height:        Intake/Output Summary (Last 24 hours) at 04/14/2024 1300 Last data filed at 04/14/2024 0517 Gross per 24 hour  Intake 240 ml  Output --  Net 240 ml   Filed Weights   04/13/24 1138 04/14/24 0524  Weight: 56.7 kg 69.9 kg   Examination:  General exam: Appears calm and comfortable  Respiratory system: Clear to auscultation. Respiratory effort normal. Cardiovascular system: normal S1 & S2 heard. No JVD, murmurs, rubs, gallops or clicks. No pedal edema. Gastrointestinal system: Abdomen is nondistended, soft and nontender. No organomegaly or masses felt. Normal bowel sounds heard. Central nervous system: Alert and oriented. No focal neurological deficits. Extremities: Symmetric 5 x 5 power. Skin: No rashes, lesions or ulcers. Psychiatry: Judgement and insight appear normal. Mood & affect appropriate.   Data Reviewed: I have personally reviewed following labs and imaging studies  CBC: Recent Labs  Lab 04/08/24 0416 04/13/24 1155 04/14/24 0445  WBC 13.8* 16.0* 10.4  NEUTROABS  --  12.6* 8.7*  HGB 10.3* 11.3* 10.2*  HCT 35.1* 37.4 33.7*  MCV 98.6 96.1 95.7  PLT 205 274 203    Basic Metabolic Panel: Recent Labs  Lab 04/08/24 0416 04/13/24 1155 04/14/24 0445  NA 141 149* 142  K 4.6 4.8 4.8  CL 105 106 103  CO2 27 28 29   GLUCOSE 116* 141* 132*  BUN 23 21 27*  CREATININE 0.78 0.84 0.93  CALCIUM  9.3 9.7 9.0  MG  --   --  2.3    CBG: No results for input(s): GLUCAP in the last 168 hours.  Recent Results (from the past 240 hours)  Culture, blood (routine x 2)     Status: Abnormal   Collection Time: 04/05/24  5:58 PM   Specimen: BLOOD LEFT HAND  Result Value Ref Range Status   Specimen Description   Final    BLOOD LEFT HAND Performed at [facility], [address] [place], [place] [address]    Special Requests   Final    BOTTLES DRAWN AEROBIC AND ANAEROBIC Blood Culture adequate volume Performed at [facility], [address] [place], [place] [address]    Culture  Setup Time   Final    GRAM POSITIVE COCCI ANAEROBIC BOTTLE ONLY CRITICAL RESULT CALLED TO, READ BACK BY AND VERIFIED WITH: PHARMD Ronal RAMAN on 012026 @1908  by SM    Culture (A)  Final    STAPHYLOCOCCUS EPIDERMIDIS THE SIGNIFICANCE OF ISOLATING THIS ORGANISM FROM A SINGLE SET OF BLOOD CULTURES WHEN MULTIPLE SETS ARE DRAWN IS UNCERTAIN. PLEASE NOTIFY THE MICROBIOLOGY DEPARTMENT WITHIN ONE WEEK IF SPECIATION AND SENSITIVITIES ARE REQUIRED. Performed at [facility], [address] [place], [place] [address]    Report Status 04/08/2024 FINAL  Final  Culture, blood (routine x 2)     Status: None   Collection Time: 04/05/24  5:58 PM  Specimen: BLOOD RIGHT HAND  Result Value Ref Range Status   Specimen Description   Final    BLOOD RIGHT HAND Performed at [facility], [address], [place], [place] [address]    Special Requests   Final    BOTTLES DRAWN AEROBIC AND ANAEROBIC Blood Culture adequate volume Performed at [facility], [address] [place], [place] [address]    Culture   Final    NO GROWTH 5 DAYS Performed at [facility], [address], [place], [place] [address]    Report Status 04/10/2024 FINAL  Final  Blood Culture ID Panel (Reflexed)     Status: Abnormal   Collection Time: 04/05/24  5:58 PM  Result Value Ref Range Status   Enterococcus faecalis NOT DETECTED NOT DETECTED Final   Enterococcus Faecium NOT DETECTED NOT DETECTED Final   Listeria monocytogenes NOT DETECTED NOT DETECTED Final   Staphylococcus species DETECTED (A) NOT DETECTED Final    Comment: CRITICAL RESULT CALLED TO, READ BACK BY AND VERIFIED WITH: PHARMD Ronal RAMAN on 012026 @1908  by SM    Staphylococcus aureus (BCID) NOT DETECTED NOT  DETECTED Final   Staphylococcus epidermidis DETECTED (A) NOT DETECTED Final    Comment: Methicillin (oxacillin) resistant coagulase negative staphylococcus. Possible blood culture contaminant (unless isolated from more than one blood culture draw or clinical case suggests pathogenicity). No antibiotic treatment is indicated for blood  culture contaminants. CRITICAL RESULT CALLED TO, READ BACK BY AND VERIFIED WITH: PHARMD Ronal RAMAN on 012026 @1908  by SM    Staphylococcus lugdunensis NOT DETECTED NOT DETECTED Final   Streptococcus species NOT DETECTED NOT DETECTED Final   Streptococcus agalactiae NOT DETECTED NOT DETECTED Final   Streptococcus pneumoniae NOT DETECTED NOT DETECTED Final   Streptococcus pyogenes NOT DETECTED NOT DETECTED Final   A.calcoaceticus-baumannii NOT DETECTED NOT DETECTED Final   Bacteroides fragilis NOT DETECTED NOT DETECTED Final   Enterobacterales NOT DETECTED NOT DETECTED Final   Enterobacter cloacae complex NOT DETECTED NOT DETECTED Final   Escherichia coli NOT DETECTED NOT DETECTED Final   Klebsiella aerogenes NOT DETECTED NOT DETECTED Final   Klebsiella oxytoca NOT DETECTED NOT DETECTED Final   Klebsiella pneumoniae NOT DETECTED NOT DETECTED Final   Proteus species NOT DETECTED NOT DETECTED Final   Salmonella species NOT DETECTED NOT DETECTED Final   Serratia marcescens NOT DETECTED NOT DETECTED Final   Haemophilus influenzae NOT DETECTED NOT DETECTED Final   Neisseria meningitidis NOT DETECTED NOT DETECTED Final   Pseudomonas aeruginosa NOT DETECTED NOT DETECTED Final   Stenotrophomonas maltophilia NOT DETECTED NOT DETECTED Final   Candida albicans NOT DETECTED NOT DETECTED Final   Candida auris NOT DETECTED NOT DETECTED Final   Candida glabrata NOT DETECTED NOT DETECTED Final   Candida krusei NOT DETECTED NOT DETECTED Final   Candida parapsilosis NOT DETECTED NOT DETECTED Final   Candida tropicalis NOT DETECTED NOT DETECTED Final   Cryptococcus  neoformans/gattii NOT DETECTED NOT DETECTED Final   Methicillin resistance mecA/C DETECTED (A) NOT DETECTED Final    Comment: CRITICAL RESULT CALLED TO, READ BACK BY AND VERIFIED WITH: MAYA Ronal RAMAN on 012026 @1908  by SM Performed at [facility], [address] [place], [place] [address]   MRSA Next Gen by PCR, Nasal     Status: None   Collection Time: 04/05/24  6:52 PM   Specimen: Nasal Mucosa; Nasal Swab  Result Value Ref Range Status   MRSA by PCR Next Gen NOT DETECTED NOT  DETECTED Final    Comment: (NOTE) The GeneXpert MRSA Assay (FDA approved for NASAL specimens only), is one component of a comprehensive MRSA colonization surveillance program. It is not intended to diagnose MRSA infection nor to guide or monitor treatment for MRSA infections. Test performance is not FDA approved in patients less than 70 years old. Performed at [facility], [address], [place], [place] [address]   Resp panel by RT-PCR (RSV, Flu A&B, Covid) Anterior Nasal Swab     Status: None   Collection Time: 04/13/24 11:50 AM   Specimen: Anterior Nasal Swab  Result Value Ref Range Status   SARS Coronavirus 2 by RT PCR NEGATIVE NEGATIVE Final    Comment: (NOTE) SARS-CoV-2 target nucleic acids are NOT DETECTED.  The SARS-CoV-2 RNA is generally detectable in upper respiratory specimens during the acute phase of infection. The lowest concentration of SARS-CoV-2 viral copies this assay can detect is 138 copies/mL. A negative result does not preclude SARS-Cov-2 infection and should not be used as the sole basis for treatment or other patient management decisions. A negative result may occur with  improper specimen collection/handling, submission of specimen other than nasopharyngeal swab, presence of viral mutation(s) within the areas targeted by this assay, and inadequate number of viral copies(<138 copies/mL). A negative result must be combined with clinical observations,  patient history, and epidemiological information. The expected result is Negative.  Fact Sheet for Patients:  bloggercourse.com  Fact Sheet for Healthcare Providers:  seriousbroker.it  This test is no t yet approved or cleared by the [place]  FDA and  has been authorized for detection and/or diagnosis of SARS-CoV-2 by FDA under an Emergency Use Authorization (EUA). This EUA will remain  in effect (meaning this test can be used) for the duration of the COVID-19 declaration under Section 564(b)(1) of the Act, 21 U.S.C.section 360bbb-3(b)(1), unless the authorization is terminated  or revoked sooner.       Influenza A by PCR NEGATIVE NEGATIVE Final   Influenza B by PCR NEGATIVE NEGATIVE Final    Comment: (NOTE) The Xpert Xpress SARS-CoV-2/FLU/RSV plus assay is intended as an aid in the diagnosis of influenza from Nasopharyngeal swab specimens and should not be used as a sole basis for treatment. Nasal washings and aspirates are unacceptable for Xpert Xpress SARS-CoV-2/FLU/RSV testing.  Fact Sheet for Patients: bloggercourse.com  Fact Sheet for Healthcare Providers: seriousbroker.it  This test is not yet approved or cleared by the [place]  FDA and has been authorized for detection and/or diagnosis of SARS-CoV-2 by FDA under an Emergency Use Authorization (EUA). This EUA will remain in effect (meaning this test can be used) for the duration of the COVID-19 declaration under Section 564(b)(1) of the Act, 21 U.S.C. section 360bbb-3(b)(1), unless the authorization is terminated or revoked.     Resp Syncytial Virus by PCR NEGATIVE NEGATIVE Final    Comment: (NOTE) Fact Sheet for Patients: bloggercourse.com  Fact Sheet for Healthcare Providers: seriousbroker.it  This test is not yet approved or cleared by the [place]  [place] FDA and has been authorized for detection and/or diagnosis of SARS-CoV-2 by FDA under an Emergency Use Authorization (EUA). This EUA will remain in effect (meaning this test can be used) for the duration of the COVID-19 declaration under Section 564(b)(1) of the Act, 21 U.S.C. section 360bbb-3(b)(1), unless the authorization is terminated or revoked.  Performed at [facility], [address] [place], [place] [address]     Radiology Studies: DG Chest Portable 1 View Result  Date: 04/13/2024 CLINICAL DATA:  Shortness of breath, CHF, COPD EXAM: PORTABLE CHEST 1 VIEW COMPARISON:  04/05/2024, 12/30/2023 FINDINGS: Stable cardiac enlargement and prominent central vascularity. Slight worsening of ill-defined bibasilar and left midlung scattered airspace opacities, suspected combination of the areas of atelectasis and pneumonia. Small left effusion still present. Left hemidiaphragm is obscured. No pneumothorax. Trachea midline. Aorta atherosclerotic. Osteopenia and degenerative changes of the spine. IMPRESSION: 1. Slight worsening of bibasilar and left midlung airspace opacities. 2. Persistent small left effusion. Electronically Signed   By: CHRISTELLA.  Shick M.D.   On: 04/13/2024 12:01   Scheduled Meds:  albuterol   2.5 mg Nebulization BID   ARIPiprazole   2 mg Oral q AM   aspirin  EC  81 mg Oral q AM   budesonide   1 mg Nebulization BID   buPROPion   150 mg Oral q AM   cholecalciferol   2,000 Units Oral Daily   docusate sodium   100 mg Oral QHS   enoxaparin  (LOVENOX ) injection  40 mg Subcutaneous Q24H   ferrous sulfate   325 mg Oral Q breakfast   folic acid   1 mg Oral Daily   furosemide   20 mg Intravenous BID   memantine   5 mg Oral BID   omega-3 acid ethyl esters  2 capsule Oral BID   pantoprazole   40 mg Oral Daily   predniSONE   5 mg Oral Q breakfast   rOPINIRole   1 mg Oral Daily   rOPINIRole   2 mg Oral QHS   sacubitril -valsartan   1 tablet Oral BID   sertraline   50 mg Oral Daily   sodium chloride   flush  3 mL Intravenous Q12H   spironolactone   12.5 mg Oral Daily   umeclidinium bromide   1 puff Inhalation Daily   Continuous Infusions:   LOS: 1 day   Time spent: 56 mins  Arya Boxley Vicci, MD How to contact the [facility] Attending or Consulting provider 7A - 7P or covering provider during after hours 7P -7A, for this patient?  Check the care team in [facility] and look for a) attending/consulting [facility] provider listed and b) the [facility] team listed Log into [email or web address] to find provider on call.  Locate the [facility] provider you are looking for under [facility] and page to a number that you can be directly reached. If you still have difficulty reaching the provider, please page the [facility] (Director on Call) for the Hospitalists listed on amion for assistance.  04/14/2024, 1:00 PM    "

## 2024-04-14 NOTE — Plan of Care (Signed)
" °  Problem: Education: Goal: Knowledge of General Education information will improve Description: Including pain rating scale, medication(s)/side effects and non-pharmacologic comfort measures Outcome: Progressing   Problem: Clinical Measurements: Goal: Ability to maintain clinical measurements within normal limits will improve Outcome: Progressing Goal: Will remain free from infection Outcome: Progressing Goal: Diagnostic test results will improve Outcome: Progressing Goal: Respiratory complications will improve Outcome: Progressing Goal: Cardiovascular complication will be avoided Outcome: Progressing   Problem: Activity: Goal: Risk for activity intolerance will decrease Outcome: Progressing   Problem: Coping: Goal: Level of anxiety will decrease Outcome: Progressing   Problem: Pain Managment: Goal: General experience of comfort will improve and/or be controlled Outcome: Progressing   Problem: Elimination: Goal: Will not experience complications related to bowel motility Outcome: Progressing Goal: Will not experience complications related to urinary retention Outcome: Progressing   Problem: Pain Managment: Goal: General experience of comfort will improve and/or be controlled Outcome: Progressing   Problem: Safety: Goal: Ability to remain free from injury will improve Outcome: Progressing   "

## 2024-04-14 NOTE — Progress Notes (Signed)
 Nurse at bedside,patient alert and oriented to person,and place,confused to time,and situation.Blood pressure was 116/40,Dr [facility] [employer].Holding the Aldactone ,and Lasix  this morning. No c/o pain or discomfort noted.Plan of care on going,

## 2024-04-14 NOTE — Telephone Encounter (Addendum)
 Received faxed refill request from [facility] for Pulmicort . Denied refill request as this is not prescribed by our office. Faxed back to [facility] with a note stating that this medication is not being prescribed by our office, it is through pt's PCP. NFN.

## 2024-04-15 MED ORDER — ACETAMINOPHEN 500 MG PO TABS: 500.0000 mg | ORAL_TABLET | Freq: Two times a day (BID) | ORAL | Status: AC

## 2024-04-15 MED ORDER — FUROSEMIDE 40 MG PO TABS: 40.0000 mg | ORAL_TABLET | Freq: Every day | ORAL | 1 refills | Status: AC

## 2024-04-15 MED ORDER — ALBUTEROL SULFATE (2.5 MG/3ML) 0.083% IN NEBU: 2.5000 mg | INHALATION_SOLUTION | Freq: Two times a day (BID) | RESPIRATORY_TRACT | Status: AC

## 2024-04-15 NOTE — Discharge Instructions (Signed)
 IMPORTANT INFORMATION: PAY CLOSE ATTENTION   PHYSICIAN DISCHARGE INSTRUCTIONS  Follow with Primary care provider  Wendolyn Jenkins Jansky, MD  and other consultants as instructed by your Hospitalist Physician  SEEK MEDICAL CARE OR RETURN TO EMERGENCY ROOM IF SYMPTOMS COME BACK, WORSEN OR NEW PROBLEM DEVELOPS   Please note: You were cared for by a hospitalist during your hospital stay. Every effort will be made to forward records to your primary care provider.  You can request that your primary care provider send for your hospital records if they have not received them.  Once you are discharged, your primary care physician will handle any further medical issues. Please note that NO REFILLS for any discharge medications will be authorized once you are discharged, as it is imperative that you return to your primary care physician (or establish a relationship with a primary care physician if you do not have one) for your post hospital discharge needs so that they can reassess your need for medications and monitor your lab values.  Please get a complete blood count and chemistry panel checked by your Primary MD at your next visit, and again as instructed by your Primary MD.  Get Medicines reviewed and adjusted: Please take all your medications with you for your next visit with your Primary MD  Laboratory/radiological data: Please request your Primary MD to go over all hospital tests and procedure/radiological results at the follow up, please ask your primary care provider to get all Hospital records sent to his/her office.  In some cases, they will be blood work, cultures and biopsy results pending at the time of your discharge. Please request that your primary care provider follow up on these results.  If you are diabetic, please bring your blood sugar readings with you to your follow up appointment with primary care.    Please call and make your follow up appointments as soon as possible.    Also Note  the following: If you experience worsening of your admission symptoms, develop shortness of breath, life threatening emergency, suicidal or homicidal thoughts you must seek medical attention immediately by calling 911 or calling your MD immediately  if symptoms less severe.  You must read complete instructions/literature along with all the possible adverse reactions/side effects for all the Medicines you take and that have been prescribed to you. Take any new Medicines after you have completely understood and accpet all the possible adverse reactions/side effects.   Do not drive when taking Pain medications or sleeping medications (Benzodiazepines)  Do not take more than prescribed Pain, Sleep and Anxiety Medications. It is not advisable to combine anxiety,sleep and pain medications without talking with your primary care practitioner  Special Instructions: If you have smoked or chewed Tobacco  in the last 2 yrs please stop smoking, stop any regular Alcohol  and or any Recreational drug use.  Wear Seat belts while driving.  Do not drive if taking any narcotic, mind altering or controlled substances or recreational drugs or alcohol.

## 2024-04-15 NOTE — Progress Notes (Signed)
 Heart Failure Navigator Progress Note  Assessed for Heart & Vascular TOC clinic readiness.  Patient does not meet criteria due to MD note that patient has a history of Dementia.  No HF TOC scheduled.  Navigator will sign off at this time.  Charmaine Pines, RN, BSN [facility] Heart Failure Navigator Secure Chat Only

## 2024-04-15 NOTE — Progress Notes (Signed)
 DISCHARGE NOTE HOME [name]  to be discharged Home per MD order. Discussed prescriptions and follow up appointments with the patient. medication list explained in detail. Patient verbalized understanding.   Skin clean, dry and intact without evidence of skin break down, no evidence of skin tears noted. IV catheter discontinued intact. Site without signs and symptoms of complications. Dressing and pressure applied. Pt denies pain at the site currently. No complaints noted.   Patient free of lines, drains, and wounds.   An After Visit Summary (AVS) was printed and given to the patient.  [name], Cena Helling, RN

## 2024-04-15 NOTE — TOC Transition Note (Signed)
 Transition of Care [facility]) - Discharge Note   Patient Details  Name: [name] MRN: [id number] Date of Birth: 26-Sep-1929  Transition of Care [facility]) CM/SW Contact:  Lucie Lunger, LCSWA Phone Number: 04/15/2024, 10:06 AM   Clinical Narrative:    CSW updated that pt is medically stable for D/C home today. CSW updated Delon with [facility] of plan for D/C, HH orders placed. TOC signing off.   Final next level of care: Home w Home Health Services Barriers to Discharge: Barriers Resolved   Patient Goals and CMS Choice Patient states their goals for this hospitalization and ongoing recovery are:: return home CMS Medicare.gov Compare Post Acute Care list provided to:: Patient Represenative (must comment) Choice offered to / list presented to : Adult Children [place] ownership interest in [facility].provided to:: Adult Children    Discharge Placement                       Discharge Plan and Services Additional resources added to the After Visit Summary for   In-house Referral: Clinical Social Work Discharge Planning Services: CM Consult Post Acute Care Choice: Home Health                    HH Arranged: RN, PT, OT [facility] Agency: [facility] Date [facility] Agency Contacted: 04/15/24   Representative spoke with at [facility] Agency: Delon  Social Drivers of Health (SDOH) Interventions SDOH Screenings   Food Insecurity: No Food Insecurity (04/13/2024)  Housing: Low Risk (04/13/2024)  Transportation Needs: No Transportation Needs (04/13/2024)  Utilities: Patient Declined (04/13/2024)  Alcohol  Screen: Low Risk (08/26/2023)  Depression (PHQ2-9): Medium Risk (09/30/2023)  Financial Resource Strain: Low Risk (08/26/2023)  Physical Activity: Insufficiently Active (08/26/2023)  Social Connections: Patient Declined (04/13/2024)  Recent Concern: Social Connections - Socially Isolated (03/23/2024)  Stress: No Stress Concern Present (08/26/2023)  Tobacco Use: Low Risk  (04/13/2024)  Health Literacy: Adequate Health Literacy (08/26/2023)     Readmission Risk Interventions    04/14/2024   10:42 AM 03/24/2024   11:14 AM 02/28/2024   11:11 AM  Readmission Risk Prevention Plan  Transportation Screening Complete Complete Complete  HRI or Home Care Consult   Complete  Social Work Consult for Recovery Care Planning/Counseling   Complete  Palliative Care Screening   Not Applicable  Medication Review [employer]) Complete Complete Complete  HRI or Home Care Consult Complete Complete   SW Recovery Care/Counseling Consult Complete Complete   Palliative Care Screening Not Applicable Not Applicable   Skilled Nursing Facility Not Applicable Not Applicable

## 2024-04-15 NOTE — Plan of Care (Signed)
  Problem: Education: Goal: Knowledge of General Education information will improve Description: Including pain rating scale, medication(s)/side effects and non-pharmacologic comfort measures Outcome: Adequate for Discharge   Problem: Health Behavior/Discharge Planning: Goal: Ability to manage health-related needs will improve Outcome: Adequate for Discharge   Problem: Clinical Measurements: Goal: Ability to maintain clinical measurements within normal limits will improve Outcome: Adequate for Discharge Goal: Will remain free from infection Outcome: Adequate for Discharge Goal: Diagnostic test results will improve Outcome: Adequate for Discharge Goal: Respiratory complications will improve Outcome: Adequate for Discharge Goal: Cardiovascular complication will be avoided Outcome: Adequate for Discharge   Problem: Activity: Goal: Risk for activity intolerance will decrease Outcome: Adequate for Discharge   Problem: Activity: Goal: Risk for activity intolerance will decrease Outcome: Adequate for Discharge   Problem: Nutrition: Goal: Adequate nutrition will be maintained Outcome: Adequate for Discharge   Problem: Coping: Goal: Level of anxiety will decrease Outcome: Adequate for Discharge   Problem: Elimination: Goal: Will not experience complications related to bowel motility Outcome: Adequate for Discharge Goal: Will not experience complications related to urinary retention Outcome: Adequate for Discharge   Problem: Pain Managment: Goal: General experience of comfort will improve and/or be controlled Outcome: Adequate for Discharge   Problem: Safety: Goal: Ability to remain free from injury will improve Outcome: Adequate for Discharge   Problem: Skin Integrity: Goal: Risk for impaired skin integrity will decrease Outcome: Adequate for Discharge

## 2024-04-15 NOTE — Discharge Summary (Signed)
 Physician Discharge Summary  [name] FMW:[id number] DOB: 12-05-1929 DOA: 04/13/2024  PCP: Wendolyn Jenkins Jansky, MD  Admit date: 04/13/2024 Discharge date: 04/15/2024  Admitted From:  Home  Disposition: Home with [facility]   Recommendations for Outpatient Follow-up:  Follow up with PCP in 1 weeks as scheduled 04/23/24  Follow up with cardiology in 2 weeks  Please obtain BMP/CBC in 1-2 weeks  Home Health: PT/OT/RN  Discharge Condition: STABLE   CODE STATUS: DNR DIET: 2 gram sodium    Brief Hospitalization Summary: Please see all hospital notes, images, labs for full details of the hospitalization. Admission provider HPI:  [name] is a 89 yo female with PMH new recent diagnosis CHF, GCA, HTN, CKD3a, anemia, dementia who again presented with recurrent shortness of breath.  She was initially hospitalized 03/22/2024 until 03/29/2024 and diagnosed with new systolic CHF and GDMT was slowly started.  She had a mild bump in creatinine and Lasix  was held briefly however does not appear to have been prescribed at discharge.  She again presented back on 1/19 with shortness of breath.  Was treated for CHF and presumed pneumonia at that time with antibiotics as well.  Still no prescription for Lasix  at discharge.  Her daughter also reports that she notices the patient becomes very fatigued and tired when giving her Coreg  and when withholding it, patient regains her energy.  She has therefore continued to hold the Coreg  recently.  She again presents back with shortness of breath and increased work of breathing. CXR shows worsening of bibasilar opacities and left midlung airspace disease with small left effusion. Daughter also reports weight gain at home and increased abdominal distention.  Lower extremity edema is about the same.  Initial troponin 81 which is similar to prior values from recent hospitalizations.  She denies any chest pain as well. proBNP 32,576, which is very mildly improved from recent  hospitalizations when they were greater than 35,000.  She was given dose of Lasix  in the ER and admitted for ongoing IV diuresis.  Hospital Course by listed problems addressed  Acute HFrEF--Treated  - Diagnosed early January 2026 and evaluated by cardiology at that time and was started on GDMT - Echo 35 to 40%, global hypokinesis.  Normal RV systolic function - Per last cardiology note she is to be on Lasix , spironolactone , Entresto , Coreg .  No SGLT2 inhibitor due to limited mobility and difficulty controlling urine - Discussed with daughter that it is reasonable to hold Coreg  if causing noticeable decrease in energy especially at advanced age with quality of life being more important at this time - Pt was treated with IV Lasix  with good results and feeling much better now - daily standing weight on scale  - Holding Coreg  as noted above; continue Entresto  and spironolactone    Chronic hypoxic respiratory failure - On 2 L oxygen  at baseline   HTN - Continue Entresto  and spironolactone  -- Continue Lasix  40 mg daily    Giant cell arteritis/polymyalgia rheumatica Chronically on Prednisone  5 mg,on Actemra  iv q28d- last one in Nov   CKD stage IIIa - has returned to baseline with what looks like 0.8-1 and GFR technically >60 - watch creat on lasix ; bump last admissions may have just been approaching baseline    Normocytic anemia Iron deficiency anemia: - Hemoglobin stable.  Baseline around 10 to 11 g/dL - Continue daily oral iron   Dementia Anxiety/Depression RLS Continue home regimen Abilify , bupropion , sertraline , Requip , PRN Valium  along with her Namenda    Liver mass/circumferential  wall thickening of the transverse colon Noted on prior CT imaging of 12/30/2023. Findings on CT concerning for primary neoplasm or metastatic disease. OF NOTE patient and family noted to have deferred workup as she would not want to pursue any oncological treatment.   Class I Obesity w/ Body mass  index is 28.59 kg/m Will benefit with PCP follow-up, weight loss   Discharge Diagnoses:  Principal Problem:   Acute on chronic systolic (congestive) heart failure (HCC) Active Problems:   HYPERTENSION, BENIGN   Anemia, iron deficiency   Polymyalgia rheumatica   Giant cell arteritis (HCC)   Depression with anxiety   Peptic ulcer disease   Discharge Instructions: Discharge Instructions     Ambulatory referral to Cardiology   Complete by: As directed    Hospital follow up for CHF      Allergies as of 04/15/2024       Reactions   Oxycodone  Itching, Other (See Comments)   Hallucinations   Statins Other (See Comments)   Muscle pain   Advair Diskus [fluticasone -salmeterol] Other (See Comments)   Mouth sores   Atorvastatin  Other (See Comments)   Severe headache   Sulfonamide Derivatives Rash        Medication List     PAUSE taking these medications    carvedilol  6.25 MG tablet Wait to take this until your doctor or other care provider tells you to start again. Commonly known as: COREG  Take 1 tablet (6.25 mg total) by mouth 2 (two) times daily with a meal.       TAKE these medications    rOPINIRole  1 MG tablet Commonly known as: REQUIP  Take 1 tablet (1 mg total) by mouth as directed. TAKE 1 TABLET IN THE MORNING AND 2 TABLETS IN THE EVENING What changed:  how much to take when to take this additional instructions The timing of this medication is very important.   acetaminophen  500 MG tablet Commonly known as: TYLENOL  Take 1 tablet (500 mg total) by mouth in the morning and at bedtime.   albuterol  108 (90 Base) MCG/ACT inhaler Commonly known as: VENTOLIN  HFA Inhale 2 puffs into the lungs every 4 (four) hours as needed for wheezing or shortness of breath.   albuterol  (2.5 MG/3ML) 0.083% nebulizer solution Commonly known as: PROVENTIL  Take 3 mLs (2.5 mg total) by nebulization in the morning and at bedtime.   ARIPiprazole  2 MG tablet Commonly known as:  ABILIFY  TAKE 1 TABLET BY MOUTH EVERY DAY *NEW PRESCRIPTION REQUEST* What changed: See the new instructions.   Aspirin  Low Dose 81 MG tablet Generic drug: aspirin  EC TAKE 1 TABLET BY MOUTH EVERY DAY *NEW PRESCRIPTION REQUEST* What changed: See the new instructions.   budesonide  1 MG/2ML nebulizer solution Commonly known as: PULMICORT  INHALE THE CONTENTS OF 1 VIAL VIA NEBULIZER TWICE DAILY *NEW PRESCRIPTION REQUEST* What changed: See the new instructions.   buPROPion  150 MG 24 hr tablet Commonly known as: WELLBUTRIN  XL Take 1 tablet (150 mg total) by mouth in the morning.   CALCIUM  PO Take 1 tablet by mouth daily.   cyanocobalamin  1000 MCG/ML injection Commonly known as: VITAMIN B12 Inject 1 mL (1,000 mcg total) into the muscle every 30 (thirty) days.   dextromethorphan  15 MG/5ML syrup Take 10 mLs (30 mg total) by mouth 4 (four) times daily as needed for cough.   diazepam  5 MG tablet Commonly known as: VALIUM  Take 1 tablet (5 mg total) by mouth See admin instructions. Take 5 mg by mouth in the evening AS  NEEDED for restlessness and an additional 5 mg once a day as needed for anxiety   docusate sodium  100 MG capsule Commonly known as: COLACE Take 1 capsule (100 mg total) by mouth daily. What changed: when to take this   estradiol 0.1 MG/GM vaginal cream Commonly known as: ESTRACE Place 1 Applicatorful vaginally daily as needed (burning).   FeroSul 325 (65 Fe) MG tablet Generic drug: ferrous sulfate  Take 1 tablet (325 mg total) by mouth daily with breakfast.   folic acid  1 MG tablet Commonly known as: FOLVITE  Take 1 tablet (1 mg total) by mouth daily.   furosemide  40 MG tablet Commonly known as: Lasix  Take 1 tablet (40 mg total) by mouth daily. Start taking on: April 16, 2024   HYDROcodone -acetaminophen  5-325 MG tablet Commonly known as: NORCO/VICODIN Take 1 tablet by mouth every 6 (six) hours as needed for moderate pain (pain score 4-6).   memantine  5 MG  tablet Commonly known as: NAMENDA  Take 1 tablet (5 mg total) by mouth in the morning and at bedtime.   omega-3 acid ethyl esters 1 g capsule Commonly known as: LOVAZA  Take 2 capsules (2 g total) by mouth 2 (two) times daily.   omeprazole  40 MG capsule Commonly known as: PRILOSEC TAKE ONE (1) CAPSULE BY MOUTH TWICE DAILY *NEW PRESCRIPTION REQUEST* What changed: See the new instructions.   ondansetron  8 MG disintegrating tablet Commonly known as: ZOFRAN -ODT Take 1 tablet (8 mg total) by mouth every 8 (eight) hours as needed for nausea or vomiting. PLACE 1 TABLET ON TONGUE EVERY 8 HOURS AS NEEDED FOR NAUSEA OR VOMITING What changed:  reasons to take this additional instructions   OXYGEN  Inhale 2 L/min into the lungs continuous.   polyethylene glycol 17 g packet Commonly known as: MIRALAX  / GLYCOLAX  Take 17 g by mouth daily. What changed:  how much to take when to take this   predniSONE  5 MG tablet Commonly known as: DELTASONE  Take 1 tablet (5 mg total) by mouth at bedtime. Resume after finished with prednisone  taper from hospitalization What changed:  when to take this additional instructions   Probiotic Chew Chew 1 each by mouth every other day.   PROBIOTIC PO Take 1 capsule by mouth every Monday, Wednesday, and Friday.   sacubitril -valsartan  24-26 MG Commonly known as: ENTRESTO  Take 1 tablet by mouth 2 (two) times daily.   sertraline  50 MG tablet Commonly known as: ZOLOFT  Take 1 tablet (50 mg total) by mouth in the morning.   Spiriva  Respimat 2.5 MCG/ACT Aers Generic drug: Tiotropium Bromide  Inhale 2 Inhalations into the lungs daily at 12 noon.   spironolactone  25 MG tablet Commonly known as: ALDACTONE  Take 0.5 tablets (12.5 mg total) by mouth daily.   Systane 0.4-0.3 % Soln Generic drug: Polyethyl Glycol-Propyl Glycol Place 1 drop into both eyes 4 (four) times daily as needed (for dryness).   tocilizumab  4 mg/kg in sodium chloride  0.9 % Inject 280 mg  into the vein every 28 (twenty-eight) days.   Vitamin D3 50 MCG (2000 UT) capsule Take 2,000 Units by mouth daily.        Contact information for follow-up providers     Wendolyn Jenkins Jansky, MD. Schedule an appointment as soon as possible for a visit in 2 week(s).   Specialty: Family Medicine Why: Hospital Follow Up Contact information: [address] [place] [address] [phone]              Contact information for after-discharge care  Home Medical Care     [facility] [place] [facility]) .   Service: Home Health Services Contact information: [address] [place]  [address] [phone]                    Allergies[1] Allergies as of 04/15/2024       Reactions   Oxycodone  Itching, Other (See Comments)   Hallucinations   Statins Other (See Comments)   Muscle pain   Advair Diskus [fluticasone -salmeterol] Other (See Comments)   Mouth sores   Atorvastatin  Other (See Comments)   Severe headache   Sulfonamide Derivatives Rash        Medication List     PAUSE taking these medications    carvedilol  6.25 MG tablet Wait to take this until your doctor or other care provider tells you to start again. Commonly known as: COREG  Take 1 tablet (6.25 mg total) by mouth 2 (two) times daily with a meal.       TAKE these medications    rOPINIRole  1 MG tablet Commonly known as: REQUIP  Take 1 tablet (1 mg total) by mouth as directed. TAKE 1 TABLET IN THE MORNING AND 2 TABLETS IN THE EVENING What changed:  how much to take when to take this additional instructions The timing of this medication is very important.   acetaminophen  500 MG tablet Commonly known as: TYLENOL  Take 1 tablet (500 mg total) by mouth in the morning and at bedtime.   albuterol  108 (90 Base) MCG/ACT inhaler Commonly known as: VENTOLIN  HFA Inhale 2 puffs into the lungs every 4 (four) hours as needed for wheezing or  shortness of breath.   albuterol  (2.5 MG/3ML) 0.083% nebulizer solution Commonly known as: PROVENTIL  Take 3 mLs (2.5 mg total) by nebulization in the morning and at bedtime.   ARIPiprazole  2 MG tablet Commonly known as: ABILIFY  TAKE 1 TABLET BY MOUTH EVERY DAY *NEW PRESCRIPTION REQUEST* What changed: See the new instructions.   Aspirin  Low Dose 81 MG tablet Generic drug: aspirin  EC TAKE 1 TABLET BY MOUTH EVERY DAY *NEW PRESCRIPTION REQUEST* What changed: See the new instructions.   budesonide  1 MG/2ML nebulizer solution Commonly known as: PULMICORT  INHALE THE CONTENTS OF 1 VIAL VIA NEBULIZER TWICE DAILY *NEW PRESCRIPTION REQUEST* What changed: See the new instructions.   buPROPion  150 MG 24 hr tablet Commonly known as: WELLBUTRIN  XL Take 1 tablet (150 mg total) by mouth in the morning.   CALCIUM  PO Take 1 tablet by mouth daily.   cyanocobalamin  1000 MCG/ML injection Commonly known as: VITAMIN B12 Inject 1 mL (1,000 mcg total) into the muscle every 30 (thirty) days.   dextromethorphan  15 MG/5ML syrup Take 10 mLs (30 mg total) by mouth 4 (four) times daily as needed for cough.   diazepam  5 MG tablet Commonly known as: VALIUM  Take 1 tablet (5 mg total) by mouth See admin instructions. Take 5 mg by mouth in the evening AS NEEDED for restlessness and an additional 5 mg once a day as needed for anxiety   docusate sodium  100 MG capsule Commonly known as: COLACE Take 1 capsule (100 mg total) by mouth daily. What changed: when to take this   estradiol 0.1 MG/GM vaginal cream Commonly known as: ESTRACE Place 1 Applicatorful vaginally daily as needed (burning).   FeroSul 325 (65 Fe) MG tablet Generic drug: ferrous sulfate  Take 1 tablet (325 mg total) by mouth daily with breakfast.   folic acid  1 MG tablet Commonly  known as: FOLVITE  Take 1 tablet (1 mg total) by mouth daily.   furosemide  40 MG tablet Commonly known as: Lasix  Take 1 tablet (40 mg total) by mouth  daily. Start taking on: April 16, 2024   HYDROcodone -acetaminophen  5-325 MG tablet Commonly known as: NORCO/VICODIN Take 1 tablet by mouth every 6 (six) hours as needed for moderate pain (pain score 4-6).   memantine  5 MG tablet Commonly known as: NAMENDA  Take 1 tablet (5 mg total) by mouth in the morning and at bedtime.   omega-3 acid ethyl esters 1 g capsule Commonly known as: LOVAZA  Take 2 capsules (2 g total) by mouth 2 (two) times daily.   omeprazole  40 MG capsule Commonly known as: PRILOSEC TAKE ONE (1) CAPSULE BY MOUTH TWICE DAILY *NEW PRESCRIPTION REQUEST* What changed: See the new instructions.   ondansetron  8 MG disintegrating tablet Commonly known as: ZOFRAN -ODT Take 1 tablet (8 mg total) by mouth every 8 (eight) hours as needed for nausea or vomiting. PLACE 1 TABLET ON TONGUE EVERY 8 HOURS AS NEEDED FOR NAUSEA OR VOMITING What changed:  reasons to take this additional instructions   OXYGEN  Inhale 2 L/min into the lungs continuous.   polyethylene glycol 17 g packet Commonly known as: MIRALAX  / GLYCOLAX  Take 17 g by mouth daily. What changed:  how much to take when to take this   predniSONE  5 MG tablet Commonly known as: DELTASONE  Take 1 tablet (5 mg total) by mouth at bedtime. Resume after finished with prednisone  taper from hospitalization What changed:  when to take this additional instructions   Probiotic Chew Chew 1 each by mouth every other day.   PROBIOTIC PO Take 1 capsule by mouth every Monday, Wednesday, and Friday.   sacubitril -valsartan  24-26 MG Commonly known as: ENTRESTO  Take 1 tablet by mouth 2 (two) times daily.   sertraline  50 MG tablet Commonly known as: ZOLOFT  Take 1 tablet (50 mg total) by mouth in the morning.   Spiriva  Respimat 2.5 MCG/ACT Aers Generic drug: Tiotropium Bromide  Inhale 2 Inhalations into the lungs daily at 12 noon.   spironolactone  25 MG tablet Commonly known as: ALDACTONE  Take 0.5 tablets (12.5 mg total)  by mouth daily.   Systane 0.4-0.3 % Soln Generic drug: Polyethyl Glycol-Propyl Glycol Place 1 drop into both eyes 4 (four) times daily as needed (for dryness).   tocilizumab  4 mg/kg in sodium chloride  0.9 % Inject 280 mg into the vein every 28 (twenty-eight) days.   Vitamin D3 50 MCG (2000 UT) capsule Take 2,000 Units by mouth daily.        Procedures/Studies: DG Chest Portable 1 View Result Date: 04/13/2024 CLINICAL DATA:  Shortness of breath, CHF, COPD EXAM: PORTABLE CHEST 1 VIEW COMPARISON:  04/05/2024, 12/30/2023 FINDINGS: Stable cardiac enlargement and prominent central vascularity. Slight worsening of ill-defined bibasilar and left midlung scattered airspace opacities, suspected combination of the areas of atelectasis and pneumonia. Small left effusion still present. Left hemidiaphragm is obscured. No pneumothorax. Trachea midline. Aorta atherosclerotic. Osteopenia and degenerative changes of the spine. IMPRESSION: 1. Slight worsening of bibasilar and left midlung airspace opacities. 2. Persistent small left effusion. Electronically Signed   By: CHRISTELLA.  Shick M.D.   On: 04/13/2024 12:01   DG Chest Portable 1 View Result Date: 04/05/2024 EXAM: 1 VIEW(S) XRAY OF THE CHEST 04/05/2024 03:54:00 PM COMPARISON: 03/26/2024 CLINICAL HISTORY: hf hf hf FINDINGS: LUNGS AND PLEURA: Left basilar airspace opacity with obscuration of the diaphragmatic leaflet. Improving atelectasis or pneumonia at the right lung base. Small  left pleural effusion. No pneumothorax. HEART AND MEDIASTINUM: Heart size at upper limits. Aortic atherosclerosis. BONES AND SOFT TISSUES: No acute osseous abnormality. IMPRESSION: 1. Left basilar airspace opacity with obscuration of the diaphragmatic leaflet. 2. Improving atelectasis or pneumonia at the right lung base. 3. Small left pleural effusion. Electronically signed by: Greig Pique MD 04/05/2024 04:26 PM EST RP Workstation: [id number]   DG Chest Port 1 View Result Date:  03/26/2024 CLINICAL DATA:  Pleural effusion. EXAM: PORTABLE CHEST 1 VIEW COMPARISON:  03/24/2024 FINDINGS: The cardio pericardial silhouette is enlarged. Similar retrocardiac left base collapse/consolidation with small effusion. Interval increase in atelectasis or infiltrate at the right base. No acute bony abnormality. IMPRESSION: 1. Interval increase in atelectasis or infiltrate at the right base. 2. Similar retrocardiac left base collapse/consolidation with small effusion. Electronically Signed   By: Camellia Candle M.D.   On: 03/26/2024 07:15   DG Chest 2 View Result Date: 03/24/2024 CLINICAL DATA:  Hypoxia. EXAM: CHEST - 2 VIEW COMPARISON:  03/22/2024 FINDINGS: Moderate left and small right pleural effusions show no significant change. Infiltrate or atelectasis in the left lower lobe is also stable. Pulmonary hyperinflation and mild cardiomegaly again noted. IMPRESSION: No significant change in moderate left and small right pleural effusions. Stable left lower lobe infiltrate or atelectasis. Electronically Signed   By: Norleen DELENA Kil M.D.   On: 03/24/2024 09:03   ECHOCARDIOGRAM COMPLETE Result Date: 03/23/2024    ECHOCARDIOGRAM REPORT   Patient Name:   [name] Date of Exam: 03/23/2024 Medical Rec #:  [id number]     Height:       63.0 in Accession #:    [id number]    Weight:       170.6 lb Date of Birth:  12-Jun-1929     BSA:          1.807 m Patient Age:    89 years      BP:           157/71 mmHg Patient Gender: F             HR:           142 bpm. Exam Location:  Inpatient Procedure: Cardiac Doppler, Color Doppler and 2D Echo (Both Spectral and Color            Flow Doppler were utilized during procedure). Indications:    CHF  History:        Patient has no prior history of Echocardiogram examinations.                 Risk Factors:Hypertension.  Sonographer:    Philomena Daring Referring Phys: [id number] VISHAL R PATEL IMPRESSIONS  1. Left ventricular ejection fraction, by estimation, is 35 to 40%. The left  ventricle has moderately decreased function. The left ventricle demonstrates global hypokinesis. There is mild concentric left ventricular hypertrophy. Left ventricular diastolic parameters are indeterminate.  2. Right ventricular systolic function is normal. The right ventricular size is normal.  3. Left atrial size was moderately dilated.  4. Moderate pleural effusion.  5. The mitral valve is degenerative. Moderate mitral valve regurgitation. No evidence of mitral stenosis.  6. The aortic valve is tricuspid. Aortic valve regurgitation is mild to moderate. No aortic stenosis is present.  7. The inferior vena cava is dilated in size with <50% respiratory variability, suggesting right atrial pressure of 15 mmHg. Comparison(s): No prior Echocardiogram. FINDINGS  Left Ventricle: Left ventricular ejection fraction, by estimation, is 35 to 40%. The left ventricle has  moderately decreased function. The left ventricle demonstrates global hypokinesis. The left ventricular internal cavity size was normal in size. There is mild concentric left ventricular hypertrophy. Left ventricular diastolic parameters are indeterminate. Right Ventricle: The right ventricular size is normal. No increase in right ventricular wall thickness. Right ventricular systolic function is normal. Left Atrium: Left atrial size was moderately dilated. Right Atrium: Right atrial size was normal in size. Pericardium: There is no evidence of pericardial effusion. Mitral Valve: The mitral valve is degenerative in appearance. Moderate mitral valve regurgitation. No evidence of mitral valve stenosis. Tricuspid Valve: The tricuspid valve is normal in structure. Tricuspid valve regurgitation is not demonstrated. No evidence of tricuspid stenosis. Aortic Valve: The aortic valve is tricuspid. Aortic valve regurgitation is mild to moderate. Aortic regurgitation PHT measures 252 msec. No aortic stenosis is present. Pulmonic Valve: The pulmonic valve was normal in  structure. Pulmonic valve regurgitation is not visualized. No evidence of pulmonic stenosis. Aorta: The aortic root and ascending aorta are structurally normal, with no evidence of dilitation. Venous: The inferior vena cava is dilated in size with less than 50% respiratory variability, suggesting right atrial pressure of 15 mmHg. IAS/Shunts: No atrial level shunt detected by color flow Doppler. Additional Comments: There is a moderate pleural effusion.  LEFT VENTRICLE PLAX 2D LVIDd:         4.80 cm      Diastology LVIDs:         3.60 cm      LV e' medial:    3.59 cm/s LV PW:         1.10 cm      LV E/e' medial:  40.9 LV IVS:        1.10 cm      LV e' lateral:   5.66 cm/s LVOT diam:     1.80 cm      LV E/e' lateral: 26.0 LV SV:         40 LV SV Index:   22 LVOT Area:     2.54 cm  LV Volumes (MOD) LV vol d, MOD A2C: 100.0 ml LV vol d, MOD A4C: 108.0 ml LV vol s, MOD A2C: 52.2 ml LV vol s, MOD A4C: 62.9 ml LV SV MOD A2C:     47.8 ml LV SV MOD A4C:     108.0 ml LV SV MOD BP:      47.1 ml RIGHT VENTRICLE             IVC RV Basal diam:  3.10 cm     IVC diam: 2.00 cm RV Mid diam:    2.00 cm RV S prime:     10.30 cm/s TAPSE (M-mode): 1.6 cm LEFT ATRIUM             Index        RIGHT ATRIUM           Index LA diam:        4.40 cm 2.43 cm/m   RA Area:     18.90 cm LA Vol (A2C):   75.3 ml 41.66 ml/m  RA Volume:   51.30 ml  28.38 ml/m LA Vol (A4C):   80.4 ml 44.48 ml/m LA Biplane Vol: 80.7 ml 44.65 ml/m  AORTIC VALVE LVOT Vmax:   87.70 cm/s LVOT Vmean:  58.000 cm/s LVOT VTI:    0.156 m AI PHT:      252 msec  AORTA Ao Root diam: 2.40 cm Ao Asc diam:  2.80 cm  MITRAL VALVE                TRICUSPID VALVE MV Area (PHT): 5.23 cm     TR Peak grad:   6.4 mmHg MV Decel Time: 145 msec     TR Vmax:        126.00 cm/s MR Peak grad: 122.8 mmHg MR Mean grad: 81.0 mmHg     SHUNTS MR Vmax:      554.00 cm/s   Systemic VTI:  0.16 m MR Vmean:     431.0 cm/s    Systemic Diam: 1.80 cm MV E velocity: 147.00 cm/s MV A velocity: 130.00 cm/s MV  E/A ratio:  1.13 Stanly Leavens MD Electronically signed by Stanly Leavens MD Signature Date/Time: 03/23/2024/12:25:10 PM    Final    DG Chest 2 View Result Date: 03/22/2024 EXAM: 2 VIEW(S) XRAY OF THE CHEST 03/22/2024 04:55:00 PM COMPARISON: 02/25/2024 CLINICAL HISTORY: SOB FINDINGS: LUNGS AND PLEURA: Mild pulmonary edema. Possible left lower lobe consolidation. Moderate left pleural effusion. No pneumothorax. HEART AND MEDIASTINUM: Cardiomegaly. Calcified aorta. BONES AND SOFT TISSUES: No acute osseous abnormality. IMPRESSION: 1. Mild pulmonary edema. 2. Moderate left pleural effusion. 3. Possible left lower lobe consolidation. 4. Cardiomegaly. Electronically signed by: Greig Pique MD 03/22/2024 05:44 PM EST RP Workstation: [id number]     Subjective: Pt reports that she is feeling well, eager to go home today.  No complaints.    Discharge Exam: Vitals:   04/15/24 0759 04/15/24 0812  BP:    Pulse:    Resp:    Temp:    SpO2: 100% 100%   Vitals:   04/15/24 0437 04/15/24 0500 04/15/24 0759 04/15/24 0812  BP: (!) 138/56     Pulse: 75     Resp: 18     Temp: 98.1 F (36.7 C)     TempSrc: Oral     SpO2: 99%  100% 100%  Weight:  70.6 kg    Height:       General: Pt is alert, awake, not in acute distress Cardiovascular: normal S1/S2 +, no rubs, no gallops Respiratory: CTA bilaterally, no wheezing, no rhonchi Abdominal: Soft, NT, ND, bowel sounds + Extremities: no edema, no cyanosis   The results of significant diagnostics from this hospitalization (including imaging, microbiology, ancillary and laboratory) are listed below for reference.     Microbiology: Recent Results (from the past 240 hours)  Culture, blood (routine x 2)     Status: Abnormal   Collection Time: 04/05/24  5:58 PM   Specimen: BLOOD LEFT HAND  Result Value Ref Range Status   Specimen Description   Final    BLOOD LEFT HAND Performed at [facility], [address], [place], [place] [address]     Special Requests   Final    BOTTLES DRAWN AEROBIC AND ANAEROBIC Blood Culture adequate volume Performed at [facility], [address], [place], [place] [address]    Culture  Setup Time   Final    GRAM POSITIVE COCCI ANAEROBIC BOTTLE ONLY CRITICAL RESULT CALLED TO, READ BACK BY AND VERIFIED WITH: PHARMD Ronal RAMAN on 012026 @1908  by SM    Culture (A)  Final    STAPHYLOCOCCUS EPIDERMIDIS THE SIGNIFICANCE OF ISOLATING THIS ORGANISM FROM A SINGLE SET OF BLOOD CULTURES WHEN MULTIPLE SETS ARE DRAWN IS UNCERTAIN. PLEASE NOTIFY THE MICROBIOLOGY DEPARTMENT WITHIN ONE WEEK IF SPECIATION AND SENSITIVITIES ARE REQUIRED. Performed at [facility], [address], [place], [place] [address]  Report Status 04/08/2024 FINAL  Final  Culture, blood (routine x 2)     Status: None   Collection Time: 04/05/24  5:58 PM   Specimen: BLOOD RIGHT HAND  Result Value Ref Range Status   Specimen Description   Final    BLOOD RIGHT HAND Performed at [facility], [address] [place], [place] [address]    Special Requests   Final    BOTTLES DRAWN AEROBIC AND ANAEROBIC Blood Culture adequate volume Performed at [facility], [address] [place], [place] [address]    Culture   Final    NO GROWTH 5 DAYS Performed at [facility], [address] [place], [place] [address]    Report Status 04/10/2024 FINAL  Final  Blood Culture ID Panel (Reflexed)     Status: Abnormal   Collection Time: 04/05/24  5:58 PM  Result Value Ref Range Status   Enterococcus faecalis NOT DETECTED NOT DETECTED Final   Enterococcus Faecium NOT DETECTED NOT DETECTED Final   Listeria monocytogenes NOT DETECTED NOT DETECTED Final   Staphylococcus species DETECTED (A) NOT DETECTED Final    Comment: CRITICAL RESULT CALLED TO, READ BACK BY AND VERIFIED WITH: PHARMD Ronal RAMAN on 012026 @1908  by SM    Staphylococcus aureus (BCID) NOT DETECTED NOT DETECTED Final   Staphylococcus  epidermidis DETECTED (A) NOT DETECTED Final    Comment: Methicillin (oxacillin) resistant coagulase negative staphylococcus. Possible blood culture contaminant (unless isolated from more than one blood culture draw or clinical case suggests pathogenicity). No antibiotic treatment is indicated for blood  culture contaminants. CRITICAL RESULT CALLED TO, READ BACK BY AND VERIFIED WITH: PHARMD Ronal RAMAN on 012026 @1908  by SM    Staphylococcus lugdunensis NOT DETECTED NOT DETECTED Final   Streptococcus species NOT DETECTED NOT DETECTED Final   Streptococcus agalactiae NOT DETECTED NOT DETECTED Final   Streptococcus pneumoniae NOT DETECTED NOT DETECTED Final   Streptococcus pyogenes NOT DETECTED NOT DETECTED Final   A.calcoaceticus-baumannii NOT DETECTED NOT DETECTED Final   Bacteroides fragilis NOT DETECTED NOT DETECTED Final   Enterobacterales NOT DETECTED NOT DETECTED Final   Enterobacter cloacae complex NOT DETECTED NOT DETECTED Final   Escherichia coli NOT DETECTED NOT DETECTED Final   Klebsiella aerogenes NOT DETECTED NOT DETECTED Final   Klebsiella oxytoca NOT DETECTED NOT DETECTED Final   Klebsiella pneumoniae NOT DETECTED NOT DETECTED Final   Proteus species NOT DETECTED NOT DETECTED Final   Salmonella species NOT DETECTED NOT DETECTED Final   Serratia marcescens NOT DETECTED NOT DETECTED Final   Haemophilus influenzae NOT DETECTED NOT DETECTED Final   Neisseria meningitidis NOT DETECTED NOT DETECTED Final   Pseudomonas aeruginosa NOT DETECTED NOT DETECTED Final   Stenotrophomonas maltophilia NOT DETECTED NOT DETECTED Final   Candida albicans NOT DETECTED NOT DETECTED Final   Candida auris NOT DETECTED NOT DETECTED Final   Candida glabrata NOT DETECTED NOT DETECTED Final   Candida krusei NOT DETECTED NOT DETECTED Final   Candida parapsilosis NOT DETECTED NOT DETECTED Final   Candida tropicalis NOT DETECTED NOT DETECTED Final   Cryptococcus neoformans/gattii NOT DETECTED NOT DETECTED  Final   Methicillin resistance mecA/C DETECTED (A) NOT DETECTED Final    Comment: CRITICAL RESULT CALLED TO, READ BACK BY AND VERIFIED WITH: MAYA Ronal RAMAN on 012026 @1908  by SM Performed at [facility], [address] [place], [place] [address]   MRSA Next Gen by PCR, Nasal     Status: None   Collection  Time: 04/05/24  6:52 PM   Specimen: Nasal Mucosa; Nasal Swab  Result Value Ref Range Status   MRSA by PCR Next Gen NOT DETECTED NOT DETECTED Final    Comment: (NOTE) The GeneXpert MRSA Assay (FDA approved for NASAL specimens only), is one component of a comprehensive MRSA colonization surveillance program. It is not intended to diagnose MRSA infection nor to guide or monitor treatment for MRSA infections. Test performance is not FDA approved in patients less than 102 years old. Performed at [facility], [address], [place], [place] [address]   Resp panel by RT-PCR (RSV, Flu A&B, Covid) Anterior Nasal Swab     Status: None   Collection Time: 04/13/24 11:50 AM   Specimen: Anterior Nasal Swab  Result Value Ref Range Status   SARS Coronavirus 2 by RT PCR NEGATIVE NEGATIVE Final    Comment: (NOTE) SARS-CoV-2 target nucleic acids are NOT DETECTED.  The SARS-CoV-2 RNA is generally detectable in upper respiratory specimens during the acute phase of infection. The lowest concentration of SARS-CoV-2 viral copies this assay can detect is 138 copies/mL. A negative result does not preclude SARS-Cov-2 infection and should not be used as the sole basis for treatment or other patient management decisions. A negative result may occur with  improper specimen collection/handling, submission of specimen other than nasopharyngeal swab, presence of viral mutation(s) within the areas targeted by this assay, and inadequate number of viral copies(<138 copies/mL). A negative result must be combined with clinical observations, patient history, and  epidemiological information. The expected result is Negative.  Fact Sheet for Patients:  bloggercourse.com  Fact Sheet for Healthcare Providers:  seriousbroker.it  This test is no t yet approved or cleared by the [place]  FDA and  has been authorized for detection and/or diagnosis of SARS-CoV-2 by FDA under an Emergency Use Authorization (EUA). This EUA will remain  in effect (meaning this test can be used) for the duration of the COVID-19 declaration under Section 564(b)(1) of the Act, 21 U.S.C.section 360bbb-3(b)(1), unless the authorization is terminated  or revoked sooner.       Influenza A by PCR NEGATIVE NEGATIVE Final   Influenza B by PCR NEGATIVE NEGATIVE Final    Comment: (NOTE) The Xpert Xpress SARS-CoV-2/FLU/RSV plus assay is intended as an aid in the diagnosis of influenza from Nasopharyngeal swab specimens and should not be used as a sole basis for treatment. Nasal washings and aspirates are unacceptable for Xpert Xpress SARS-CoV-2/FLU/RSV testing.  Fact Sheet for Patients: bloggercourse.com  Fact Sheet for Healthcare Providers: seriousbroker.it  This test is not yet approved or cleared by the [place]  FDA and has been authorized for detection and/or diagnosis of SARS-CoV-2 by FDA under an Emergency Use Authorization (EUA). This EUA will remain in effect (meaning this test can be used) for the duration of the COVID-19 declaration under Section 564(b)(1) of the Act, 21 U.S.C. section 360bbb-3(b)(1), unless the authorization is terminated or revoked.     Resp Syncytial Virus by PCR NEGATIVE NEGATIVE Final    Comment: (NOTE) Fact Sheet for Patients: bloggercourse.com  Fact Sheet for Healthcare Providers: seriousbroker.it  This test is not yet approved or cleared by the [place]  FDA and has been  authorized for detection and/or diagnosis of SARS-CoV-2 by FDA under an Emergency Use Authorization (EUA). This EUA will remain in effect (meaning this test can be used) for the duration of the COVID-19 declaration under Section 564(b)(1) of the Act, 21 U.S.C. section 360bbb-3(b)(1), unless the authorization  is terminated or revoked.  Performed at [facility], [address], [place], [place] [address]      Labs: BNP (last 3 results) Recent Labs    06/10/23 1315  BNP 292.4*   Basic Metabolic Panel: Recent Labs  Lab 04/13/24 1155 04/14/24 0445  NA 149* 142  K 4.8 4.8  CL 106 103  CO2 28 29  GLUCOSE 141* 132*  BUN 21 27*  CREATININE 0.84 0.93  CALCIUM  9.7 9.0  MG  --  2.3   Liver Function Tests: Recent Labs  Lab 04/13/24 1155  AST 27  ALT 9  ALKPHOS 121  BILITOT 0.4  PROT 6.6  ALBUMIN 3.6   No results for input(s): LIPASE, AMYLASE in the last 168 hours. No results for input(s): AMMONIA in the last 168 hours. CBC: Recent Labs  Lab 04/13/24 1155 04/14/24 0445  WBC 16.0* 10.4  NEUTROABS 12.6* 8.7*  HGB 11.3* 10.2*  HCT 37.4 33.7*  MCV 96.1 95.7  PLT 274 203   Cardiac Enzymes: No results for input(s): CKTOTAL, CKMB, CKMBINDEX, TROPONINI in the last 168 hours. BNP: Invalid input(s): POCBNP CBG: No results for input(s): GLUCAP in the last 168 hours. D-Dimer No results for input(s): DDIMER in the last 72 hours. Hgb A1c No results for input(s): HGBA1C in the last 72 hours. Lipid Profile No results for input(s): CHOL, HDL, LDLCALC, TRIG, CHOLHDL, LDLDIRECT in the last 72 hours. Thyroid  function studies No results for input(s): TSH, T4TOTAL, T3FREE, THYROIDAB in the last 72 hours.  Invalid input(s): FREET3 Anemia work up No results for input(s): VITAMINB12, FOLATE, FERRITIN, TIBC, IRON, RETICCTPCT in the last 72 hours. Urinalysis    Component Value Date/Time   COLORURINE YELLOW 02/25/2024 1111    APPEARANCEUR HAZY (A) 02/25/2024 1111   LABSPEC 1.015 02/25/2024 1111   PHURINE 6.0 02/25/2024 1111   GLUCOSEU NEGATIVE 02/25/2024 1111   GLUCOSEU NEGATIVE 01/11/2008 1526   HGBUR SMALL (A) 02/25/2024 1111   BILIRUBINUR NEGATIVE 02/25/2024 1111   BILIRUBINUR neg 08/29/2021 1301   KETONESUR 5 (A) 02/25/2024 1111   PROTEINUR 100 (A) 02/25/2024 1111   UROBILINOGEN 1.0 08/29/2021 1301   UROBILINOGEN 0.2 mg/dL 89/73/7990 8473   NITRITE NEGATIVE 02/25/2024 1111   LEUKOCYTESUR SMALL (A) 02/25/2024 1111   Sepsis Labs Recent Labs  Lab 04/13/24 1155 04/14/24 0445  WBC 16.0* 10.4   Microbiology Recent Results (from the past 240 hours)  Culture, blood (routine x 2)     Status: Abnormal   Collection Time: 04/05/24  5:58 PM   Specimen: BLOOD LEFT HAND  Result Value Ref Range Status   Specimen Description   Final    BLOOD LEFT HAND Performed at [facility], [address], [place], [place] [address]    Special Requests   Final    BOTTLES DRAWN AEROBIC AND ANAEROBIC Blood Culture adequate volume Performed at [facility], [address], [place], [place] [address]    Culture  Setup Time   Final    GRAM POSITIVE COCCI ANAEROBIC BOTTLE ONLY CRITICAL RESULT CALLED TO, READ BACK BY AND VERIFIED WITH: PHARMD Ronal RAMAN on 012026 @1908  by SM    Culture (A)  Final    STAPHYLOCOCCUS EPIDERMIDIS THE SIGNIFICANCE OF ISOLATING THIS ORGANISM FROM A SINGLE SET OF BLOOD CULTURES WHEN MULTIPLE SETS ARE DRAWN IS UNCERTAIN. PLEASE NOTIFY THE MICROBIOLOGY DEPARTMENT WITHIN ONE WEEK IF SPECIATION AND SENSITIVITIES ARE REQUIRED. Performed at [facility], [address] [place], [place] [address]  Report Status 04/08/2024 FINAL  Final  Culture, blood (routine x 2)     Status: None   Collection Time: 04/05/24  5:58 PM   Specimen: BLOOD RIGHT HAND  Result Value Ref Range Status   Specimen Description   Final    BLOOD RIGHT HAND Performed at [facility],  [address], [place], [place] [address]    Special Requests   Final    BOTTLES DRAWN AEROBIC AND ANAEROBIC Blood Culture adequate volume Performed at [facility], [address], [place], [place] [address]    Culture   Final    NO GROWTH 5 DAYS Performed at [facility], [address], [place], [place] [address]    Report Status 04/10/2024 FINAL  Final  Blood Culture ID Panel (Reflexed)     Status: Abnormal   Collection Time: 04/05/24  5:58 PM  Result Value Ref Range Status   Enterococcus faecalis NOT DETECTED NOT DETECTED Final   Enterococcus Faecium NOT DETECTED NOT DETECTED Final   Listeria monocytogenes NOT DETECTED NOT DETECTED Final   Staphylococcus species DETECTED (A) NOT DETECTED Final    Comment: CRITICAL RESULT CALLED TO, READ BACK BY AND VERIFIED WITH: PHARMD Ronal RAMAN on 012026 @1908  by SM    Staphylococcus aureus (BCID) NOT DETECTED NOT DETECTED Final   Staphylococcus epidermidis DETECTED (A) NOT DETECTED Final    Comment: Methicillin (oxacillin) resistant coagulase negative staphylococcus. Possible blood culture contaminant (unless isolated from more than one blood culture draw or clinical case suggests pathogenicity). No antibiotic treatment is indicated for blood  culture contaminants. CRITICAL RESULT CALLED TO, READ BACK BY AND VERIFIED WITH: PHARMD Ronal RAMAN on 012026 @1908  by SM    Staphylococcus lugdunensis NOT DETECTED NOT DETECTED Final   Streptococcus species NOT DETECTED NOT DETECTED Final   Streptococcus agalactiae NOT DETECTED NOT DETECTED Final   Streptococcus pneumoniae NOT DETECTED NOT DETECTED Final   Streptococcus pyogenes NOT DETECTED NOT DETECTED Final   A.calcoaceticus-baumannii NOT DETECTED NOT DETECTED Final   Bacteroides fragilis NOT DETECTED NOT DETECTED Final   Enterobacterales NOT DETECTED NOT DETECTED Final   Enterobacter cloacae complex NOT DETECTED NOT DETECTED Final   Escherichia coli NOT DETECTED NOT DETECTED Final    Klebsiella aerogenes NOT DETECTED NOT DETECTED Final   Klebsiella oxytoca NOT DETECTED NOT DETECTED Final   Klebsiella pneumoniae NOT DETECTED NOT DETECTED Final   Proteus species NOT DETECTED NOT DETECTED Final   Salmonella species NOT DETECTED NOT DETECTED Final   Serratia marcescens NOT DETECTED NOT DETECTED Final   Haemophilus influenzae NOT DETECTED NOT DETECTED Final   Neisseria meningitidis NOT DETECTED NOT DETECTED Final   Pseudomonas aeruginosa NOT DETECTED NOT DETECTED Final   Stenotrophomonas maltophilia NOT DETECTED NOT DETECTED Final   Candida albicans NOT DETECTED NOT DETECTED Final   Candida auris NOT DETECTED NOT DETECTED Final   Candida glabrata NOT DETECTED NOT DETECTED Final   Candida krusei NOT DETECTED NOT DETECTED Final   Candida parapsilosis NOT DETECTED NOT DETECTED Final   Candida tropicalis NOT DETECTED NOT DETECTED Final   Cryptococcus neoformans/gattii NOT DETECTED NOT DETECTED Final   Methicillin resistance mecA/C DETECTED (A) NOT DETECTED Final    Comment: CRITICAL RESULT CALLED TO, READ BACK BY AND VERIFIED WITH: MAYA Ronal RAMAN on 012026 @1908  by SM Performed at [facility], [address] [place], [place] [address]   MRSA Next Gen by PCR, Nasal     Status: None   Collection  Time: 04/05/24  6:52 PM   Specimen: Nasal Mucosa; Nasal Swab  Result Value Ref Range Status   MRSA by PCR Next Gen NOT DETECTED NOT DETECTED Final    Comment: (NOTE) The GeneXpert MRSA Assay (FDA approved for NASAL specimens only), is one component of a comprehensive MRSA colonization surveillance program. It is not intended to diagnose MRSA infection nor to guide or monitor treatment for MRSA infections. Test performance is not FDA approved in patients less than 17 years old. Performed at [facility], [address] [place], [place] [address]   Resp panel by RT-PCR (RSV, Flu A&B, Covid) Anterior Nasal Swab     Status: None   Collection Time:  04/13/24 11:50 AM   Specimen: Anterior Nasal Swab  Result Value Ref Range Status   SARS Coronavirus 2 by RT PCR NEGATIVE NEGATIVE Final    Comment: (NOTE) SARS-CoV-2 target nucleic acids are NOT DETECTED.  The SARS-CoV-2 RNA is generally detectable in upper respiratory specimens during the acute phase of infection. The lowest concentration of SARS-CoV-2 viral copies this assay can detect is 138 copies/mL. A negative result does not preclude SARS-Cov-2 infection and should not be used as the sole basis for treatment or other patient management decisions. A negative result may occur with  improper specimen collection/handling, submission of specimen other than nasopharyngeal swab, presence of viral mutation(s) within the areas targeted by this assay, and inadequate number of viral copies(<138 copies/mL). A negative result must be combined with clinical observations, patient history, and epidemiological information. The expected result is Negative.  Fact Sheet for Patients:  bloggercourse.com  Fact Sheet for Healthcare Providers:  seriousbroker.it  This test is no t yet approved or cleared by the [place]  FDA and  has been authorized for detection and/or diagnosis of SARS-CoV-2 by FDA under an Emergency Use Authorization (EUA). This EUA will remain  in effect (meaning this test can be used) for the duration of the COVID-19 declaration under Section 564(b)(1) of the Act, 21 U.S.C.section 360bbb-3(b)(1), unless the authorization is terminated  or revoked sooner.       Influenza A by PCR NEGATIVE NEGATIVE Final   Influenza B by PCR NEGATIVE NEGATIVE Final    Comment: (NOTE) The Xpert Xpress SARS-CoV-2/FLU/RSV plus assay is intended as an aid in the diagnosis of influenza from Nasopharyngeal swab specimens and should not be used as a sole basis for treatment. Nasal washings and aspirates are unacceptable for Xpert Xpress  SARS-CoV-2/FLU/RSV testing.  Fact Sheet for Patients: bloggercourse.com  Fact Sheet for Healthcare Providers: seriousbroker.it  This test is not yet approved or cleared by the [place]  FDA and has been authorized for detection and/or diagnosis of SARS-CoV-2 by FDA under an Emergency Use Authorization (EUA). This EUA will remain in effect (meaning this test can be used) for the duration of the COVID-19 declaration under Section 564(b)(1) of the Act, 21 U.S.C. section 360bbb-3(b)(1), unless the authorization is terminated or revoked.     Resp Syncytial Virus by PCR NEGATIVE NEGATIVE Final    Comment: (NOTE) Fact Sheet for Patients: bloggercourse.com  Fact Sheet for Healthcare Providers: seriousbroker.it  This test is not yet approved or cleared by the [place]  FDA and has been authorized for detection and/or diagnosis of SARS-CoV-2 by FDA under an Emergency Use Authorization (EUA). This EUA will remain in effect (meaning this test can be used) for the duration of the COVID-19 declaration under Section 564(b)(1) of the Act, 21 U.S.C. section 360bbb-3(b)(1), unless the authorization  is terminated or revoked.  Performed at [facility], [address] [place], [place] [address]    Time coordinating discharge: 34 mins   SIGNED:  Afton Louder, MD  [facility] 04/15/2024, 11:06 AM How to contact the [facility] Attending or Consulting provider 7A - 7P or covering provider during after hours 7P -7A, for this patient?  Check the care team in [facility] and look for a) attending/consulting [facility] provider listed and b) the [facility] team listed Log into [email or web address] and use [place]'s universal password to access. If you do not have the password, please contact the hospital operator. Locate the [facility] provider you are looking for under [facility] and page to a number that you can  be directly reached. If you still have difficulty reaching the provider, please page the [facility] (Director on Call) for the Hospitalists listed on amion for assistance.     [1]  Allergies Allergen Reactions   Oxycodone  Itching and Other (See Comments)    Hallucinations   Statins Other (See Comments)    Muscle pain   Advair Diskus [Fluticasone -Salmeterol] Other (See Comments)    Mouth sores   Atorvastatin  Other (See Comments)    Severe headache   Sulfonamide Derivatives Rash

## 2024-04-16 ENCOUNTER — Telehealth: Payer: Self-pay

## 2024-04-16 MED ORDER — FLUCONAZOLE 150 MG PO TABS: ORAL_TABLET | ORAL | 0 refills | Status: DC

## 2024-04-16 NOTE — Transitions of Care (Post Inpatient/ED Visit) (Signed)
 "  04/16/2024  Name: [name] MRN: [id number] DOB: Feb 05, 1930  Today's TOC FU Call Status: Today's TOC FU Call Status:: Successful TOC FU Call Completed TOC FU Call Complete Date: 04/16/24  Patient's Name and Date of Birth confirmed. Name, DOB  Transition Care Management Follow-up Telephone Call Date of Discharge: 04/15/24 [facility] Type of Discharge: Inpatient Admission Primary Inpatient Discharge Diagnosis:: Acute on chronic systolic (congestive) heart failure How have you been since you were released from the hospital?: Better Any questions or concerns?: No  Items Reviewed: Did you receive and understand the discharge instructions provided?: Yes Medications obtained,verified, and reconciled?: Yes (Medications Reviewed) Any new allergies since your discharge?: No Dietary orders reviewed?: Yes Type of Diet Ordered:: heart healthy diet Do you have support at home?: Yes People in Home [RPT]: child(ren), adult, other relative(s) Name of Support/Comfort Primary Source: daughter, [name] currently staying with daughter; other relative gives respite care  Medications Reviewed Today: Medications Reviewed Today     Reviewed by Sally Almarie LABOR, RN (Registered Nurse) on 04/16/24 at 1512  Med List Status: <None>   Medication Order Taking? Sig Documenting Provider Last Dose Status Informant  acetaminophen  (TYLENOL ) 500 MG tablet [id number] Yes Take 1 tablet (500 mg total) by mouth in the morning and at bedtime. Johnson, Clanford L, MD  Active   albuterol  (PROVENTIL ) (2.5 MG/3ML) 0.083% nebulizer solution [id number] Yes Take 3 mLs (2.5 mg total) by nebulization in the morning and at bedtime. Johnson, Clanford L, MD  Active   albuterol  (VENTOLIN  HFA) 108 (90 Base) MCG/ACT inhaler [id number] Yes Inhale 2 puffs into the lungs every 4 (four) hours as needed for wheezing or shortness of breath. [provider]  Active Family Member  ARIPiprazole  (ABILIFY ) 2  MG tablet [id number] Yes TAKE 1 TABLET BY MOUTH EVERY DAY *NEW PRESCRIPTION REQUEST* Wendolyn Jenkins Jansky, MD  Active Family Member  ASPIRIN  LOW DOSE 81 MG tablet [id number] Yes TAKE 1 TABLET BY MOUTH EVERY DAY *NEW PRESCRIPTION REQUEST* Wendolyn Jenkins Jansky, MD  Active Family Member  budesonide  (PULMICORT ) 1 MG/2ML nebulizer solution [id number] Yes INHALE THE CONTENTS OF 1 VIAL VIA NEBULIZER TWICE DAILY *NEW PRESCRIPTION REQUESTDEWAINE Wendolyn Jenkins Jansky, MD  Active Family Member  buPROPion  (WELLBUTRIN  XL) 150 MG 24 hr tablet [id number] Yes Take 1 tablet (150 mg total) by mouth in the morning. Wendolyn Jenkins Jansky, MD  Active Family Member  CALCIUM  PO [id number] Yes Take 1 tablet by mouth daily. [provider]  Active Family Member  carvedilol  (COREG ) 6.25 MG tablet [id number]  Take 1 tablet (6.25 mg total) by mouth 2 (two) times daily with a meal.  Patient not taking: Reported on 04/16/2024   Christobal Guadalajara, MD  Active Family Member  Cholecalciferol  (VITAMIN D3) 50 MCG (2000 UT) capsule [id number] Yes Take 2,000 Units by mouth daily. [provider]  Active Family Member  cyanocobalamin  (VITAMIN B12) 1000 MCG/ML injection [id number] Yes Inject 1 mL (1,000 mcg total) into the muscle every 30 (thirty) days. Wendolyn Jenkins Jansky, MD  Active Family Member  dextromethorphan  15 MG/5ML syrup [id number] Yes Take 10 mLs (30 mg total) by mouth 4 (four) times daily as needed for cough. Rojelio Nest, DO  Active   diazepam  (VALIUM ) 5 MG tablet [id number] Yes Take 1 tablet (5 mg total) by mouth See admin instructions. Take 5 mg by mouth in the evening AS NEEDED for restlessness and an additional 5 mg once a day as needed for anxiety Wendolyn Jenkins Jansky, MD  Active  Family Member  docusate sodium  (COLACE) 100 MG capsule [id number] Yes Take 1 capsule (100 mg total) by mouth daily. Wendolyn Jenkins Jansky, MD  Active Family Member  estradiol (ESTRACE) 0.1 MG/GM vaginal cream [id number] Yes Place 1 Applicatorful vaginally daily as needed (burning).  [provider]  Active Family Member  ferrous sulfate  325 (65 FE) MG tablet [id number] Yes Take 1 tablet (325 mg total) by mouth daily with breakfast. Christobal Guadalajara, MD  Active Family Member  folic acid  (FOLVITE ) 1 MG tablet [id number] Yes Take 1 tablet (1 mg total) by mouth daily. Wendolyn Jenkins Jansky, MD  Active Family Member  furosemide  (LASIX ) 40 MG tablet [id number] Yes Take 1 tablet (40 mg total) by mouth daily. Johnson, Clanford L, MD  Active   HYDROcodone -acetaminophen  (NORCO/VICODIN) 5-325 MG tablet [id number]  Take 1 tablet by mouth every 6 (six) hours as needed for moderate pain (pain score 4-6).  Patient not taking: Reported on 04/16/2024   Wendolyn Jenkins Jansky, MD  Active Family Member  memantine  (NAMENDA ) 5 MG tablet [id number] Yes Take 1 tablet (5 mg total) by mouth in the morning and at bedtime. Wendolyn Jenkins Jansky, MD  Active Family Member  omega-3 acid ethyl esters (LOVAZA ) 1 g capsule [id number] Yes Take 2 capsules (2 g total) by mouth 2 (two) times daily. Wendolyn Jenkins Jansky, MD  Active Family Member  omeprazole  [facility]) 40 MG capsule [id number] Yes TAKE ONE (1) CAPSULE BY MOUTH TWICE DAILY *NEW PRESCRIPTION REQUEST* Wendolyn Jenkins Jansky, MD  Active Family Member  ondansetron  (ZOFRAN -ODT) 8 MG disintegrating tablet [id number] Yes Take 1 tablet (8 mg total) by mouth every 8 (eight) hours as needed for nausea or vomiting. PLACE 1 TABLET ON TONGUE EVERY 8 HOURS AS NEEDED FOR NAUSEA OR VOMITING Wendolyn Jenkins Jansky, MD  Active Family Member  OXYGEN  [id number] Yes Inhale 2 L/min into the lungs continuous. [provider]  Active Family Member  polyethylene glycol (MIRALAX  / GLYCOLAX ) 17 g packet [id number] Yes Take 17 g by mouth daily. Wendolyn Jenkins Jansky, MD  Active Family Member  predniSONE  (DELTASONE ) 5 MG tablet [id number] Yes Take 1 tablet (5 mg total) by mouth at bedtime. Resume after finished with prednisone  taper from hospitalization  Patient taking differently: Take 5 mg by mouth daily with  breakfast.   Evonnie Lenis, MD  Active Family Member           Med Note MARISA, NATHANEL LOISE Kitchens Apr 05, 2024  7:58 PM) ONGOING THERAPY  Probiotic CHEW [id number]  Chew 1 each by mouth every other day.  Patient not taking: Reported on 04/16/2024   Wendolyn Jenkins Jansky, MD  Consider Medication Status and Discontinue (Patient Preference) Family Member  Probiotic Product (PROBIOTIC PO) [id number] Yes Take 1 capsule by mouth every Monday, Wednesday, and Friday. [provider]  Active Family Member  rOPINIRole  (REQUIP ) 1 MG tablet [id number] Yes Take 1 tablet (1 mg total) by mouth as directed. TAKE 1 TABLET IN THE MORNING AND 2 TABLETS IN THE EVENING Wendolyn Jenkins Jansky, MD  Active Family Member  sacubitril -valsartan  (ENTRESTO ) 24-26 MG [id number] Yes Take 1 tablet by mouth 2 (two) times daily. Christobal Guadalajara, MD  Active Family Member  sertraline  (ZOLOFT ) 50 MG tablet [id number] Yes Take 1 tablet (50 mg total) by mouth in the morning. Wendolyn Jenkins Jansky, MD  Active Family Member  spironolactone  (ALDACTONE ) 25 MG tablet [id number] Yes Take 0.5 tablets (12.5 mg total) by mouth daily. Christobal Guadalajara, MD  Active Family Member  SYSTANE 0.4-0.3 % SOLN [id number] Yes Place 1 drop into both eyes 4 (four) times daily as needed (for dryness). Wendolyn Jenkins Jansky, MD  Active Family Member  Tiotropium Bromide  (SPIRIVA  RESPIMAT) 2.5 MCG/ACT AERS [id number] Yes Inhale 2 Inhalations into the lungs daily at 12 noon. Wendolyn Jenkins Jansky, MD  Active Family Member  tocilizumab  4 mg/kg in sodium chloride  0.9 % [id number] Yes Inject 280 mg into the vein every 28 (twenty-eight) days. [provider]  Active Family Member           Med Note MARISA, NATHANEL LOISE Kitchens Apr 05, 2024  7:19 PM)    Med List Note Lorne Been, CPhT 03/23/24 1345): [name] (daughter) handles medications.             Home Care and Equipment/Supplies: Were Home Health Services Ordered?: Yes Name of Home Health Agency:: [facility] Has Agency set up a time to come to  your home?: Yes First Home Health Visit Date: 04/16/24 Any new equipment or medical supplies ordered?: No  Functional Questionnaire: Do you need assistance with bathing/showering or dressing?: Yes (daughter managing) Do you need assistance with meal preparation?: Yes (daughter) Do you need assistance with eating?: No Do you have difficulty maintaining continence: No (depends) Do you need assistance with getting out of bed/getting out of a chair/moving?: Yes (daughter able to help) Do you have difficulty managing or taking your medications?: Yes (doing well with daughter's help)  Follow up appointments reviewed: PCP Follow-up appointment confirmed?: Yes MD Provider Line Number:[phone] Given: No Date of PCP follow-up appointment?: 04/19/24 Follow-up Provider: Dr. Wendolyn Specialist [facility] Follow-up appointment confirmed?: No Reason Specialist Follow-Up Not Confirmed: Patient has Specialist Provider Number and will Call for Appointment Do you need transportation to your follow-up appointment?: No Do you understand care options if your condition(s) worsen?: Yes-patient verbalized understanding  SDOH Interventions Today    Flowsheet Row Most Recent Value  SDOH Interventions   Food Insecurity Interventions Intervention Not Indicated  Housing Interventions Intervention Not Indicated  Transportation Interventions Intervention Not Indicated  Utilities Interventions Intervention Not Indicated    Discussed and offered 30 day TOC program.  Patient agreeable to RN Care Manager calls.  The patient has been provided with contact information for the care management team and has been advised to call with any health -related questions or concerns.  The patient verbalized understanding with current plan of care.  The patient is directed to their insurance card regarding availability of benefits coverage.    [name], RN [place] / [facility] RN Care Manager / Transition  of Care Direct Dial: [phone]  "

## 2024-04-16 NOTE — Telephone Encounter (Signed)
 Patient notified of Dr. Enos message via MyChart. Daughter(DPR Verified) and nurse notified as well via telephone. Antibiotic sent into [facility] which is listed on file.

## 2024-04-16 NOTE — Telephone Encounter (Signed)
 Copied from CRM #[id number]. Topic: Clinical - Home Health Verbal Orders >> Apr 16, 2024  3:01 PM Mercedes MATSU wrote: Caller/Agency: RN [facility] Callback Number: [phone] secure line able to leave voicemail  Service Requested: Resumption of care, patient went in hospital with CHF again and requesting Skilled nursing Frequency: 1 time a week for 3 weeks for cardio pulmonary assessment  Any new concerns about the patient? Yes, patient had a yeast rash and was hoping the provider can call in diflucan  for yeast.

## 2024-04-16 NOTE — Transitions of Care (Post Inpatient/ED Visit) (Signed)
" ° °  04/16/2024  Name: [name] MRN: [id number] DOB: Jan 23, 1930  Today's TOC FU Call Status: Today's TOC FU Call Status:: Unsuccessful Call (1st Attempt) Unsuccessful Call (1st Attempt) Date: 04/16/24  Attempted to reach the patient regarding the most recent Inpatient/ED visit.  Follow Up Plan: Additional outreach attempts will be made to reach the patient to complete the Transitions of Care (Post Inpatient/ED visit) call.  Home health was in the home; patient's daughter asked for a call back.  [name], RN [place] / [facility] RN Care Manager / Transition of Care Direct Dial: [phone]  "

## 2024-04-16 NOTE — Patient Instructions (Signed)
 Visit Information  Thank you for taking time to visit with me today. Please don't hesitate to contact me if I can be of assistance to you before our next scheduled telephone appointment.  Our next appointment is by telephone on 04/22/24 at 1100 am  Following is a copy of your care plan:   Goals Addressed             This Visit's Progress    [facility]I Transitions of Care (TOC) Care Plan       Problems:  Recent Hospitalization for treatment of CHF Functional/Safety concern: fatigue with greater personal care needs. and Knowledge Deficit Related to new diagnosis of Heart Failure   Goal:  Over the next 30 days, the patient will not experience hospital readmission  Interventions:   Heart Failure Interventions: Provided education on low sodium diet Reviewed Heart Failure Action Plan in depth and provided written copy Advised patient to weigh each morning after emptying bladder Reviewed role of diuretics in prevention of fluid overload and management of heart failure; Discussed the importance of keeping all appointments with provider Family member had CHF Action Plan in front of her; I reiterated to seek care for increased shortness of breath, 3 pound weight gain in one day. Schedule an appointment with Cardiology.  Other interventions:.    Protect skin integrity with frequent bathroom visits and barrier cream on skin  Follow with home health nurse  Call doctor for new pain with urination, or difficulty urinating.  Stay hydrated and read nutrition labels for sodium content.  Patient Self Care Activities:  Attend all scheduled provider appointments Call provider office for new concerns or questions  Participate in Transition of Care Program/Attend [facility] scheduled calls Take medications as prescribed   Work with the social worker to address care coordination needs and will continue to work with the clinical team to address health care and disease management related needs Phone visit with  Social Worker on 05/03/24 to discuss home care services.  Plan:  The patient has been provided with contact information for the care management team and has been advised to call with any health related questions or concerns.  Patient/family to make Cardiology appointment, take daily weights and Blood Pressures.        Patient verbalizes understanding of instructions and care plan provided today and agrees to view in MyChart. Active MyChart status and patient understanding of how to access instructions and care plan via MyChart confirmed with patient.     The patient has been provided with contact information for the care management team and has been advised to call with any health related questions or concerns.   Please call the care guide team at [phone] if you need to cancel or reschedule your appointment.   Please call the Suicide and Crisis Lifeline: 988 if you are experiencing a Mental Health or Behavioral Health Crisis or need someone to talk to.  [name], RN  / [facility] RN Care Manager / Transition of Care Direct Dial: [phone]

## 2024-04-19 ENCOUNTER — Inpatient Hospital Stay: Admitting: Family Medicine

## 2024-04-20 ENCOUNTER — Inpatient Hospital Stay: Admitting: Family Medicine

## 2024-04-20 ENCOUNTER — Telehealth: Admitting: Family Medicine

## 2024-04-20 ENCOUNTER — Encounter: Payer: Self-pay | Admitting: Family Medicine

## 2024-04-20 DIAGNOSIS — J9621 Acute and chronic respiratory failure with hypoxia: Secondary | ICD-10-CM

## 2024-04-20 DIAGNOSIS — I5043 Acute on chronic combined systolic (congestive) and diastolic (congestive) heart failure: Secondary | ICD-10-CM

## 2024-04-20 DIAGNOSIS — J441 Chronic obstructive pulmonary disease with (acute) exacerbation: Secondary | ICD-10-CM

## 2024-04-20 NOTE — Patient Instructions (Addendum)
 So glad she is doing better!!! Make sure Cardiology does labs on Friday  Let me know if you need anything

## 2024-04-22 ENCOUNTER — Other Ambulatory Visit: Payer: Self-pay

## 2024-04-22 ENCOUNTER — Telehealth: Payer: Self-pay

## 2024-04-22 NOTE — Patient Instructions (Signed)
 Visit Information  Thank you for taking time to visit with me today. Please don't hesitate to contact me if I can be of assistance to you before our next scheduled telephone appointment.  Our next appointment is by telephone on 04/29/24 at 1100 am  Following is a copy of your care plan:   Goals Addressed             This Visit's Progress    VBCI Transitions of Care (TOC) Care Plan       Problems:  Recent Hospitalization for treatment of CHF Functional/Safety concern: fatigue with greater personal care needs. and Knowledge Deficit Related to new diagnosis of Heart Failure   Goal:  Over the next 30 days, the patient will not experience hospital readmission  Interventions:   Heart Failure Interventions: Provided education on low sodium diet Advised patient to weigh each morning after emptying bladder Reviewed role of diuretics in prevention of fluid overload and management of heart failure; Discussed the importance of keeping all appointments with provider Daughter has heart failure action plan.  Reinforced signs to monitor for. Cardiology appointment 04/23/24. Reviewed weight.  Today's weight 144.8 lbs.    Patient Self Care Activities:  Attend all scheduled provider appointments Call provider office for new concerns or questions  Participate in Transition of Care Program/Attend [facility] scheduled calls Take medications as prescribed   Work with the social worker to address care coordination needs and will continue to work with the clinical team to address health care and disease management related needs Phone visit with Social Worker on 05/03/24 to discuss home care services.  Plan:  The patient has been provided with contact information for the care management team and has been advised to call with any health related questions or concerns.         Patient verbalizes understanding of instructions and care plan provided today and agrees to view in MyChart. Active MyChart status  and patient understanding of how to access instructions and care plan via MyChart confirmed with patient.     The patient has been provided with contact information for the care management team and has been advised to call with any health related questions or concerns.   Please call the care guide team at [phone] if you need to cancel or reschedule your appointment.   Please call the Suicide and Crisis Lifeline: 988 if you are experiencing a Mental Health or Behavioral Health Crisis or need someone to talk to.  Raj Landress J. Janaiah Vetrano RN, MSN [facility], [facility] RN Care Manager Direct Dial: [phone]  Fax: [phone] Website: [email or web address]

## 2024-04-22 NOTE — Transitions of Care (Post Inpatient/ED Visit) (Signed)
 " Transition of Care week 2  Visit Note  04/22/2024  Name: [name] MRN: [id number]          DOB: 1929-08-21  Situation: Patient enrolled in [facility] 30-day program. Visit completed with daughter [name] by telephone.   Background:   Initial Transition Care Management Follow-up Telephone Call Discharge Date and Diagnosis: 04/15/24, Acute on chronic systolic (congestive) heart failure   Past Medical History:  Diagnosis Date   Abdominal bloating    Abnormal LFTs (liver function tests)    Ankle fracture, bimalleolar, closed, right, initial encounter    Anxiety disorder    Arthritis    Asthma    Bimalleolar ankle fracture, right, closed, initial encounter 01/06/2019   Depression    Diverticulosis    Edema    Fatty liver    Fibromyalgia    GERD (gastroesophageal reflux disease)    Giant cell arteritis (HCC)    No biopsy secondary to steroid use   Hx of adenomatous colonic polyps    Hyperlipidemia    Hypertension    IBS (irritable bowel syndrome)    Lumbar radiculitis    Osteoporosis 09/2017   T score -2.6 left femoral neck   Peptic ulcer disease    Skin cancer    shoulder, leg, right eye (? squamous by resection)   Thoracic radiculitis    Thyroid  nodule    removed   Urinary incontinence    Vertigo     Assessment: Patient Reported Symptoms: Cognitive Cognitive Status: Alert and oriented to person, place, and time (per daughter [name])      Neurological Neurological Review of Symptoms: No symptoms reported    HEENT HEENT Symptoms Reported: No symptoms reported      Cardiovascular Cardiovascular Symptoms Reported: No symptoms reported Does patient have uncontrolled Hypertension?: No Weight: 144 lb 12.8 oz (65.7 kg)  Respiratory Respiratory Symptoms Reported: Productive cough Additional Respiratory Details: Clear sputum, using incentive spirometer Respiratory Management Strategies: Oxygen  therapy  Endocrine Endocrine Symptoms Reported: No symptoms reported     Gastrointestinal Gastrointestinal Symptoms Reported: No symptoms reported      Genitourinary Genitourinary Symptoms Reported: No symptoms reported    Integumentary Integumentary Symptoms Reported: Skin changes Additional Integumentary Details: Daughter reports reddened peri area is much better.  Daughter continues to monitor. Skin Management Strategies: Routine screening, Medication therapy  Musculoskeletal Musculoskelatal Symptoms Reviewed: Unsteady gait, Weakness Additional Musculoskeletal Details: PT and OT active.  Daughter reports patient was able to take a few steps today. Musculoskeletal Management Strategies: Routine screening Falls in the past year?: No    Psychosocial Psychosocial Symptoms Reported: Not assessed Additional Psychological Details: daughter completes the assessment         Today's Vitals   04/22/24 1112  Weight: 144 lb 12.8 oz (65.7 kg)      Medications Reviewed Today     Reviewed by Adama Ferber, RN (Case Manager) on 04/22/24 at 1128  Med List Status: <None>   Medication Order Taking? Sig Documenting Provider Last Dose Status Informant  acetaminophen  (TYLENOL ) 500 MG tablet [id number] Yes Take 1 tablet (500 mg total) by mouth in the morning and at bedtime. Vicci Afton CROME, MD  Active   albuterol  (PROVENTIL ) (2.5 MG/3ML) 0.083% nebulizer solution [id number] Yes Take 3 mLs (2.5 mg total) by nebulization in the morning and at bedtime. Johnson, Clanford L, MD  Active   albuterol  (VENTOLIN  HFA) 108 (90 Base) MCG/ACT inhaler [id number] Yes Inhale 2 puffs into the lungs every 4 (four) hours as needed for wheezing  or shortness of breath. [provider]  Active Family Member  ARIPiprazole  (ABILIFY ) 2 MG tablet [id number] Yes TAKE 1 TABLET BY MOUTH EVERY DAY *NEW PRESCRIPTION REQUEST* Wendolyn Jenkins Jansky, MD  Active Family Member  ASPIRIN  LOW DOSE 81 MG tablet [id number] Yes TAKE 1 TABLET BY MOUTH EVERY DAY *NEW PRESCRIPTION REQUEST* Wendolyn Jenkins Jansky, MD   Active Family Member  budesonide  (PULMICORT ) 1 MG/2ML nebulizer solution [id number]  INHALE THE CONTENTS OF 1 VIAL VIA NEBULIZER TWICE DAILY *NEW PRESCRIPTION REQUESTDEWAINE Wendolyn Jenkins Jansky, MD  Active Family Member  buPROPion  (WELLBUTRIN  XL) 150 MG 24 hr tablet [id number] Yes Take 1 tablet (150 mg total) by mouth in the morning. Wendolyn Jenkins Jansky, MD  Active Family Member  CALCIUM  PO [id number] Yes Take 1 tablet by mouth daily. [provider]  Active Family Member  cephALEXin  (KEFLEX ) 250 MG capsule [id number] Yes 250 mg daily in the afternoon. [provider]  Active   Cholecalciferol  (VITAMIN D3) 50 MCG (2000 UT) capsule [id number] Yes Take 2,000 Units by mouth daily. [provider]  Active Family Member  cyanocobalamin  (VITAMIN B12) 1000 MCG/ML injection [id number] Yes Inject 1 mL (1,000 mcg total) into the muscle every 30 (thirty) days. Wendolyn Jenkins Jansky, MD  Active Family Member  dextromethorphan  15 MG/5ML syrup [id number] Yes Take 10 mLs (30 mg total) by mouth 4 (four) times daily as needed for cough. Rojelio Nest, DO  Active   diazepam  (VALIUM ) 5 MG tablet [id number] Yes Take 1 tablet (5 mg total) by mouth See admin instructions. Take 5 mg by mouth in the evening AS NEEDED for restlessness and an additional 5 mg once a day as needed for anxiety Wendolyn Jenkins Jansky, MD  Active Family Member  docusate sodium  (COLACE) 100 MG capsule [id number] Yes Take 1 capsule (100 mg total) by mouth daily. Wendolyn Jenkins Jansky, MD  Active Family Member  estradiol (ESTRACE) 0.1 MG/GM vaginal cream [id number] Yes Place 1 Applicatorful vaginally daily as needed (burning). [provider]  Active Family Member  ferrous sulfate  325 (65 FE) MG tablet [id number] Yes Take 1 tablet (325 mg total) by mouth daily with breakfast. Christobal Guadalajara, MD  Active Family Member  folic acid  (FOLVITE ) 1 MG tablet [id number] Yes Take 1 tablet (1 mg total) by mouth daily. Wendolyn Jenkins Jansky, MD  Active Family Member  furosemide   (LASIX ) 40 MG tablet [id number] Yes Take 1 tablet (40 mg total) by mouth daily. Johnson, Clanford L, MD  Active   HYDROcodone -acetaminophen  (NORCO/VICODIN) 5-325 MG tablet [id number]  Take 1 tablet by mouth every 6 (six) hours as needed for moderate pain (pain score 4-6).  Patient not taking: Reported on 04/22/2024   Wendolyn Jenkins Jansky, MD  Active Family Member  memantine  (NAMENDA ) 5 MG tablet [id number] Yes Take 1 tablet (5 mg total) by mouth in the morning and at bedtime. Wendolyn Jenkins Jansky, MD  Active Family Member  omega-3 acid ethyl esters (LOVAZA ) 1 g capsule [id number] Yes Take 2 capsules (2 g total) by mouth 2 (two) times daily. Wendolyn Jenkins Jansky, MD  Active Family Member  omeprazole  [facility]) 40 MG capsule [id number] Yes TAKE ONE (1) CAPSULE BY MOUTH TWICE DAILY *NEW PRESCRIPTION REQUEST* Wendolyn Jenkins Jansky, MD  Active Family Member  ondansetron  (ZOFRAN -ODT) 8 MG disintegrating tablet [id number] Yes Take 1 tablet (8 mg total) by mouth every 8 (eight) hours as needed for nausea or vomiting. PLACE 1 TABLET ON TONGUE EVERY 8 HOURS AS NEEDED FOR NAUSEA OR VOMITING Wendolyn Jenkins Jansky,  MD  Active Family Member  OXYGEN  [id number] Yes Inhale 2 L/min into the lungs continuous. [provider]  Active Family Member  polyethylene glycol (MIRALAX  / GLYCOLAX ) 17 g packet [id number] Yes Take 17 g by mouth daily. Wendolyn Jenkins Jansky, MD  Active Family Member  predniSONE  (DELTASONE ) 5 MG tablet [id number]  Take 1 tablet (5 mg total) by mouth at bedtime. Resume after finished with prednisone  taper from hospitalization  Patient taking differently: Take 5 mg by mouth daily with breakfast.   Evonnie Lenis, MD  Active Family Member           Med Note MARISA, NATHANEL LOISE Kitchens Apr 05, 2024  7:58 PM) ONGOING THERAPY  Probiotic CHEW [id number]  Chew 1 each by mouth every other day. Wendolyn Jenkins Jansky, MD  Active Family Member  Probiotic Product (PROBIOTIC PO) [id number] Yes Take 1 capsule by mouth every Monday, Wednesday, and Friday.  [provider]  Active Family Member  rOPINIRole  (REQUIP ) 1 MG tablet [id number] Yes Take 1 tablet (1 mg total) by mouth as directed. TAKE 1 TABLET IN THE MORNING AND 2 TABLETS IN THE EVENING Wendolyn Jenkins Jansky, MD  Active Family Member  sacubitril -valsartan  (ENTRESTO ) 24-26 MG [id number] Yes Take 1 tablet by mouth 2 (two) times daily. Christobal Guadalajara, MD  Active Family Member  sertraline  (ZOLOFT ) 50 MG tablet [id number] Yes Take 1 tablet (50 mg total) by mouth in the morning. Wendolyn Jenkins Jansky, MD  Active Family Member  spironolactone  (ALDACTONE ) 25 MG tablet [id number] Yes Take 0.5 tablets (12.5 mg total) by mouth daily. Christobal Guadalajara, MD  Active Family Member  SYSTANE 0.4-0.3 % SOLN [id number] Yes Place 1 drop into both eyes 4 (four) times daily as needed (for dryness). Wendolyn Jenkins Jansky, MD  Active Family Member  Tiotropium Bromide  (SPIRIVA  RESPIMAT) 2.5 MCG/ACT AERS [id number] Yes Inhale 2 Inhalations into the lungs daily at 12 noon. Wendolyn Jenkins Jansky, MD  Active Family Member  tocilizumab  4 mg/kg in sodium chloride  0.9 % [id number]  Inject 280 mg into the vein every 28 (twenty-eight) days. [provider]  Active Family Member           Med Note MARISA, NATHANEL LOISE Kitchens Apr 05, 2024  7:19 PM)    Med List Note Lorne Been, CPhT 03/23/24 1345): [name] (daughter) handles medications.             Goals Addressed             This Visit's Progress    [facility] Transitions of Care (TOC) Care Plan       Problems:  Recent Hospitalization for treatment of CHF Functional/Safety concern: fatigue with greater personal care needs. and Knowledge Deficit Related to new diagnosis of Heart Failure   Goal:  Over the next 30 days, the patient will not experience hospital readmission  Interventions:   Heart Failure Interventions: Provided education on low sodium diet Advised patient to weigh each morning after emptying bladder Reviewed role of diuretics in prevention of fluid overload and management  of heart failure; Discussed the importance of keeping all appointments with provider Daughter has heart failure action plan.  Reinforced signs to monitor for. Cardiology appointment 04/23/24. Reviewed weight.  Today's weight 144.8 lbs.    Patient Self Care Activities:  Attend all scheduled provider appointments Call provider office for new concerns or questions  Participate in Transition of Care Program/Attend TOC scheduled calls Take medications as prescribed   Work with the social worker  to address care coordination needs and will continue to work with the clinical team to address health care and disease management related needs Phone visit with Social Worker on 05/03/24 to discuss home care services.  Plan:  The patient has been provided with contact information for the care management team and has been advised to call with any health related questions or concerns.         Recommendation:   Continue Current Plan of Care  Follow Up Plan:   Telephone follow-up in 1 week  Marvie Brevik J. Emmelyn Schmale RN, MSN [facility], [facility] RN Care Manager Direct Dial: [phone]  Fax: [phone] Website: [email or web address]      "

## 2024-04-22 NOTE — Telephone Encounter (Signed)
 Copied from CRM [id number]. Topic: Clinical - Medical Advice >> Apr 22, 2024  2:06 PM Montie POUR wrote: Reason for CRM:  Katheryn is calling to let doctor know that she will see her for PT on 04/26/24 per daughter's request. Callback [phone] with any questions.

## 2024-04-23 ENCOUNTER — Encounter: Payer: Self-pay | Admitting: Nurse Practitioner

## 2024-04-23 ENCOUNTER — Ambulatory Visit: Admitting: Nurse Practitioner

## 2024-04-23 VITALS — BP 144/60 | HR 79 | Ht 62.0 in | Wt 149.0 lb

## 2024-04-23 DIAGNOSIS — E782 Mixed hyperlipidemia: Secondary | ICD-10-CM

## 2024-04-23 DIAGNOSIS — I10 Essential (primary) hypertension: Secondary | ICD-10-CM

## 2024-04-23 DIAGNOSIS — Z9981 Dependence on supplemental oxygen: Secondary | ICD-10-CM

## 2024-04-23 DIAGNOSIS — I351 Nonrheumatic aortic (valve) insufficiency: Secondary | ICD-10-CM

## 2024-04-23 DIAGNOSIS — I5020 Unspecified systolic (congestive) heart failure: Secondary | ICD-10-CM

## 2024-04-23 DIAGNOSIS — N183 Chronic kidney disease, stage 3 unspecified: Secondary | ICD-10-CM

## 2024-04-23 DIAGNOSIS — I340 Nonrheumatic mitral (valve) insufficiency: Secondary | ICD-10-CM

## 2024-04-23 DIAGNOSIS — R160 Hepatomegaly, not elsewhere classified: Secondary | ICD-10-CM

## 2024-04-23 NOTE — Patient Instructions (Signed)
 Medication Instructions:  Your physician recommends that you continue on your current medications as directed. Please refer to the Current Medication list given to you today.  *If you need a refill on your cardiac medications before your next appointment, please call your pharmacy*  Lab Work: CBC, BMET today  Testing/Procedures: Your physician has requested that you have an echocardiogram 2-3 months. Echocardiography is a painless test that uses sound waves to create images of your heart. It provides your doctor with information about the size and shape of your heart and how well your hearts chambers and valves are working. This procedure takes approximately one hour. There are no restrictions for this procedure. Please do NOT wear cologne, perfume, aftershave, or lotions (deodorant is allowed). Please arrive 15 minutes prior to your appointment time.  Please note: We ask at that you not bring children with you during ultrasound (echo/ vascular) testing. Due to room size and safety concerns, children are not allowed in the ultrasound rooms during exams. Our front office staff cannot provide observation of children in our lobby area while testing is being conducted. An adult accompanying a patient to their appointment will only be allowed in the ultrasound room at the discretion of the ultrasound technician under special circumstances. We apologize for any inconvenience.   Follow-Up: At [facility], you and your health needs are our priority.  As part of our continuing mission to provide you with exceptional heart care, our providers are all part of one team.  This team includes your primary Cardiologist (physician) and Advanced Practice Providers or APPs (Physician Assistants and Nurse Practitioners) who all work together to provide you with the care you need, when you need it.  Your next appointment:   3-4 months post echo  Provider:   Redell Shallow, MD    We recommend signing  up for the patient portal called MyChart.  Sign up information is provided on this After Visit Summary.  MyChart is used to connect with patients for Virtual Visits (Telemedicine).  Patients are able to view lab/test results, encounter notes, upcoming appointments, etc.  Non-urgent messages can be sent to your provider as well.   To learn more about what you can do with MyChart, go to [email or web address].

## 2024-04-23 NOTE — Telephone Encounter (Signed)
 A user error has taken place: encounter opened in error, closed for administrative reasons.

## 2024-04-23 NOTE — Progress Notes (Signed)
 "  Office Visit    Patient Name: [name] Date of Encounter: 04/23/2024  Primary Care Provider:  Wendolyn Jenkins Jansky, MD Primary Cardiologist:  Redell Shallow, MD  Chief Complaint    89 year old female with a history of HFrEF, mitral valve regurgitation, aortic valve regurgitation, hypertension, orthostatic hypotension, hyperlipidemia, CKD stage IIIa, chronic hypoxic respiratory failure on 2 L home oxygen , polymyalgia rheumatica, giant cell arteritis, frequent falls, liver mass, mild dementia, depression, and anxiety who presents for hospital follow-up related to heart failure.  Past Medical History    Past Medical History:  Diagnosis Date   Abdominal bloating    Abnormal LFTs (liver function tests)    Ankle fracture, bimalleolar, closed, right, initial encounter    Anxiety disorder    Arthritis    Asthma    Bimalleolar ankle fracture, right, closed, initial encounter 01/06/2019   Depression    Diverticulosis    Edema    Fatty liver    Fibromyalgia    GERD (gastroesophageal reflux disease)    Giant cell arteritis (HCC)    No biopsy secondary to steroid use   Hx of adenomatous colonic polyps    Hyperlipidemia    Hypertension    IBS (irritable bowel syndrome)    Lumbar radiculitis    Osteoporosis 09/2017   T score -2.6 left femoral neck   Peptic ulcer disease    Skin cancer    shoulder, leg, right eye (? squamous by resection)   Thoracic radiculitis    Thyroid  nodule    removed   Urinary incontinence    Vertigo    Past Surgical History:  Procedure Laterality Date   ABDOMINAL HYSTERECTOMY     BSO   BACK SURGERY  2005   lower back   EYE SURGERY Right 2006   LIVER BIOPSY     fatty liver   NASAL SEPTUM SURGERY     ORIF ANKLE FRACTURE Right 01/11/2019   Procedure: OPEN REDUCTION INTERNAL FIXATION (ORIF) RIGHT BIMALLEOLAR ANKLE FRACTURE;  Surgeon: Jerri Kay HERO, MD;  Location: [facility];  Service: Orthopedics;  Laterality: Right;   right knee  surgery  2006   arthroscopy   right leg skin cancer surgery  2014   right shoulder skin cancer excision  2012   SKIN CANCER EXCISION     THYROIDECTOMY      Allergies  Allergies[1]   Labs/Other Studies Reviewed    The following studies were reviewed today:  Cardiac Studies & Procedures   ______________________________________________________________________________________________   STRESS TESTS  MYOCARDIAL PERFUSION IMAGING 08/11/2014  Interpretation Summary No evidence of ischemia or scar by perfusion imaging. EKG shows no ischemic changes. LV systolic function is normal. No segmental wall motion abnormalities.   ECHOCARDIOGRAM  ECHOCARDIOGRAM COMPLETE 03/23/2024  Narrative ECHOCARDIOGRAM REPORT    Patient Name:   [name] Date of Exam: 03/23/2024 Medical Rec #:  [id number]     Height:       63.0 in Accession #:    [id number]    Weight:       170.6 lb Date of Birth:  1929-08-02     BSA:          1.807 m Patient Age:    89 years      BP:           157/71 mmHg Patient Gender: F             HR:           142 bpm.  Exam Location:  Inpatient  Procedure: Cardiac Doppler, Color Doppler and 2D Echo (Both Spectral and Color Flow Doppler were utilized during procedure).  Indications:    CHF  History:        Patient has no prior history of Echocardiogram examinations. Risk Factors:Hypertension.  Sonographer:    Philomena Daring Referring Phys: [id number] VISHAL R PATEL  IMPRESSIONS   1. Left ventricular ejection fraction, by estimation, is 35 to 40%. The left ventricle has moderately decreased function. The left ventricle demonstrates global hypokinesis. There is mild concentric left ventricular hypertrophy. Left ventricular diastolic parameters are indeterminate. 2. Right ventricular systolic function is normal. The right ventricular size is normal. 3. Left atrial size was moderately dilated. 4. Moderate pleural effusion. 5. The mitral valve is degenerative. Moderate  mitral valve regurgitation. No evidence of mitral stenosis. 6. The aortic valve is tricuspid. Aortic valve regurgitation is mild to moderate. No aortic stenosis is present. 7. The inferior vena cava is dilated in size with <50% respiratory variability, suggesting right atrial pressure of 15 mmHg.  Comparison(s): No prior Echocardiogram.  FINDINGS Left Ventricle: Left ventricular ejection fraction, by estimation, is 35 to 40%. The left ventricle has moderately decreased function. The left ventricle demonstrates global hypokinesis. The left ventricular internal cavity size was normal in size. There is mild concentric left ventricular hypertrophy. Left ventricular diastolic parameters are indeterminate.  Right Ventricle: The right ventricular size is normal. No increase in right ventricular wall thickness. Right ventricular systolic function is normal.  Left Atrium: Left atrial size was moderately dilated.  Right Atrium: Right atrial size was normal in size.  Pericardium: There is no evidence of pericardial effusion.  Mitral Valve: The mitral valve is degenerative in appearance. Moderate mitral valve regurgitation. No evidence of mitral valve stenosis.  Tricuspid Valve: The tricuspid valve is normal in structure. Tricuspid valve regurgitation is not demonstrated. No evidence of tricuspid stenosis.  Aortic Valve: The aortic valve is tricuspid. Aortic valve regurgitation is mild to moderate. Aortic regurgitation PHT measures 252 msec. No aortic stenosis is present.  Pulmonic Valve: The pulmonic valve was normal in structure. Pulmonic valve regurgitation is not visualized. No evidence of pulmonic stenosis.  Aorta: The aortic root and ascending aorta are structurally normal, with no evidence of dilitation.  Venous: The inferior vena cava is dilated in size with less than 50% respiratory variability, suggesting right atrial pressure of 15 mmHg.  IAS/Shunts: No atrial level shunt detected by  color flow Doppler.  Additional Comments: There is a moderate pleural effusion.   LEFT VENTRICLE PLAX 2D LVIDd:         4.80 cm      Diastology LVIDs:         3.60 cm      LV e' medial:    3.59 cm/s LV PW:         1.10 cm      LV E/e' medial:  40.9 LV IVS:        1.10 cm      LV e' lateral:   5.66 cm/s LVOT diam:     1.80 cm      LV E/e' lateral: 26.0 LV SV:         40 LV SV Index:   22 LVOT Area:     2.54 cm  LV Volumes (MOD) LV vol d, MOD A2C: 100.0 ml LV vol d, MOD A4C: 108.0 ml LV vol s, MOD A2C: 52.2 ml LV vol s, MOD A4C: 62.9 ml LV  SV MOD A2C:     47.8 ml LV SV MOD A4C:     108.0 ml LV SV MOD BP:      47.1 ml  RIGHT VENTRICLE             IVC RV Basal diam:  3.10 cm     IVC diam: 2.00 cm RV Mid diam:    2.00 cm RV S prime:     10.30 cm/s TAPSE (M-mode): 1.6 cm  LEFT ATRIUM             Index        RIGHT ATRIUM           Index LA diam:        4.40 cm 2.43 cm/m   RA Area:     18.90 cm LA Vol (A2C):   75.3 ml 41.66 ml/m  RA Volume:   51.30 ml  28.38 ml/m LA Vol (A4C):   80.4 ml 44.48 ml/m LA Biplane Vol: 80.7 ml 44.65 ml/m AORTIC VALVE LVOT Vmax:   87.70 cm/s LVOT Vmean:  58.000 cm/s LVOT VTI:    0.156 m AI PHT:      252 msec  AORTA Ao Root diam: 2.40 cm Ao Asc diam:  2.80 cm  MITRAL VALVE                TRICUSPID VALVE MV Area (PHT): 5.23 cm     TR Peak grad:   6.4 mmHg MV Decel Time: 145 msec     TR Vmax:        126.00 cm/s MR Peak grad: 122.8 mmHg MR Mean grad: 81.0 mmHg     SHUNTS MR Vmax:      554.00 cm/s   Systemic VTI:  0.16 m MR Vmean:     431.0 cm/s    Systemic Diam: 1.80 cm MV E velocity: 147.00 cm/s MV A velocity: 130.00 cm/s MV E/A ratio:  1.13  Stanly Leavens MD Electronically signed by Stanly Leavens MD Signature Date/Time: 03/23/2024/12:25:10 PM    Final          ______________________________________________________________________________________________     Recent Labs: 06/10/2023: B Natriuretic Peptide  292.4 02/25/2024: TSH 1.910 04/13/2024: ALT 9; Pro Brain Natriuretic Peptide 32,576.0 04/14/2024: BUN 27; Creatinine, Ser 0.93; Hemoglobin 10.2; Magnesium  2.3; Platelets 203; Potassium 4.8; Sodium 142  Recent Lipid Panel    Component Value Date/Time   CHOL 284 (H) 01/13/2018 1055   TRIG (H) 01/13/2018 1055    753.0 Triglyceride is over 400; calculations on Lipids are invalid.   HDL 41.70 01/13/2018 1055   CHOLHDL 7 01/13/2018 1055   VLDL 43.8 (H) 08/15/2015 0851   LDLDIRECT 157.0 08/18/2019 1157    History of Present Illness   89 year old female with the above past medical history including HFrEF, mitral valve regurgitation, aortic valve regurgitation, hypertension, orthostatic hypotension, hyperlipidemia, CKD stage IIIa, chronic hypoxic respiratory failure on 2 L home oxygen , polymyalgia rheumatica, giant cell arteritis, frequent falls, liver mass, mild dementia, depression, and anxiety.  She was hospitalized in early January 2026 in the setting of acute combined systolic and diastolic heart failure.  Echocardiogram showed EF 35 to 40%, moderate left atrial enlargement, moderate mitral valve regurgitation, mild to moderate aortic insufficiency.  Cardiology was consulted in the setting of newly reduced EF. She was diuresed with IV Lasix , started on GDMT.  SGLT2 inhibitor was deferred in the setting of urinary incontinence with limited mobility.  She was discharged home in stable condition.  She  was hospitalized from 04/05/2024 to 04/09/2024 in the setting of HCAP, HFrEF.  She was treated with antibiotics.  Carvedilol  was held in the setting of somnolence, fatigue.  She was hospitalized from 04/13/2024 to 04/15/2024 in the setting of acute HFrEF, worsening shortness of breath.  She was once again diuresed appropriately and discharged home in stable condition.  She presents today for follow-up accompanied by her daughter.  Since her most recent hospitalization she has done well from a cardiac  standpoint.  She denies any chest pain, palpitations, dizziness, dyspnea, edema, PND, orthopnea, weight gain. She reports a mildly decreased appetite.  Otherwise, she reports feeling well.   Home Medications    Current Outpatient Medications  Medication Sig Dispense Refill   acetaminophen  (TYLENOL ) 500 MG tablet Take 1 tablet (500 mg total) by mouth in the morning and at bedtime.     albuterol  (PROVENTIL ) (2.5 MG/3ML) 0.083% nebulizer solution Take 3 mLs (2.5 mg total) by nebulization in the morning and at bedtime.     albuterol  (VENTOLIN  HFA) 108 (90 Base) MCG/ACT inhaler Inhale 2 puffs into the lungs every 4 (four) hours as needed for wheezing or shortness of breath.     ARIPiprazole  (ABILIFY ) 2 MG tablet TAKE 1 TABLET BY MOUTH EVERY DAY *NEW PRESCRIPTION REQUEST* 90 tablet 3   ASPIRIN  LOW DOSE 81 MG tablet TAKE 1 TABLET BY MOUTH EVERY DAY *NEW PRESCRIPTION REQUEST* 90 tablet 11   budesonide  (PULMICORT ) 1 MG/2ML nebulizer solution INHALE THE CONTENTS OF 1 VIAL VIA NEBULIZER TWICE DAILY *NEW PRESCRIPTION REQUEST* 180 mL 11   buPROPion  (WELLBUTRIN  XL) 150 MG 24 hr tablet Take 1 tablet (150 mg total) by mouth in the morning. 90 tablet 3   CALCIUM  PO Take 1 tablet by mouth daily.     cephALEXin  (KEFLEX ) 250 MG capsule 250 mg daily in the afternoon.     Cholecalciferol  (VITAMIN D3) 50 MCG (2000 UT) capsule Take 2,000 Units by mouth daily.     cyanocobalamin  (VITAMIN B12) 1000 MCG/ML injection Inject 1 mL (1,000 mcg total) into the muscle every 30 (thirty) days. 3 mL 3   dextromethorphan  15 MG/5ML syrup Take 10 mLs (30 mg total) by mouth 4 (four) times daily as needed for cough. 120 mL 0   diazepam  (VALIUM ) 5 MG tablet Take 1 tablet (5 mg total) by mouth See admin instructions. Take 5 mg by mouth in the evening AS NEEDED for restlessness and an additional 5 mg once a day as needed for anxiety 100 tablet 1   docusate sodium  (COLACE) 100 MG capsule Take 1 capsule (100 mg total) by mouth daily. 90  capsule 11   estradiol (ESTRACE) 0.1 MG/GM vaginal cream Place 1 Applicatorful vaginally daily as needed (burning).     ferrous sulfate  325 (65 FE) MG tablet Take 1 tablet (325 mg total) by mouth daily with breakfast. 30 tablet 0   folic acid  (FOLVITE ) 1 MG tablet Take 1 tablet (1 mg total) by mouth daily. 100 tablet 3   furosemide  (LASIX ) 40 MG tablet Take 1 tablet (40 mg total) by mouth daily. 30 tablet 1   memantine  (NAMENDA ) 5 MG tablet Take 1 tablet (5 mg total) by mouth in the morning and at bedtime. 180 tablet 3   omega-3 acid ethyl esters (LOVAZA ) 1 g capsule Take 2 capsules (2 g total) by mouth 2 (two) times daily. 360 capsule 3   omeprazole  (PRILOSEC) 40 MG capsule TAKE ONE (1) CAPSULE BY MOUTH TWICE DAILY *NEW PRESCRIPTION REQUEST* 180  capsule 3   ondansetron  (ZOFRAN -ODT) 8 MG disintegrating tablet Take 1 tablet (8 mg total) by mouth every 8 (eight) hours as needed for nausea or vomiting. PLACE 1 TABLET ON TONGUE EVERY 8 HOURS AS NEEDED FOR NAUSEA OR VOMITING 100 tablet 1   OXYGEN  Inhale 2 L/min into the lungs continuous.     polyethylene glycol (MIRALAX  / GLYCOLAX ) 17 g packet Take 17 g by mouth daily. 100 each 3   Probiotic CHEW Chew 1 each by mouth every other day. 100 tablet 3   Probiotic Product (PROBIOTIC PO) Take 1 capsule by mouth every Monday, Wednesday, and Friday.     rOPINIRole  (REQUIP ) 1 MG tablet Take 1 tablet (1 mg total) by mouth as directed. TAKE 1 TABLET IN THE MORNING AND 2 TABLETS IN THE EVENING (Patient taking differently: Take 1 mg by mouth in the morning and at bedtime. TAKE 1 TABLET IN THE MORNING AND 2 TABLETS IN THE EVENING) 270 tablet 3   sacubitril -valsartan  (ENTRESTO ) 24-26 MG Take 1 tablet by mouth 2 (two) times daily. 60 tablet 0   sertraline  (ZOLOFT ) 50 MG tablet Take 1 tablet (50 mg total) by mouth in the morning. 90 tablet 3   spironolactone  (ALDACTONE ) 25 MG tablet Take 0.5 tablets (12.5 mg total) by mouth daily. 15 tablet 0   SYSTANE 0.4-0.3 % SOLN  Place 1 drop into both eyes 4 (four) times daily as needed (for dryness). 30 mL 3   Tiotropium Bromide  (SPIRIVA  RESPIMAT) 2.5 MCG/ACT AERS Inhale 2 Inhalations into the lungs daily at 12 noon. 12 g 3   HYDROcodone -acetaminophen  (NORCO/VICODIN) 5-325 MG tablet Take 1 tablet by mouth every 6 (six) hours as needed for moderate pain (pain score 4-6). (Patient not taking: Reported on 04/23/2024) 20 tablet 0   predniSONE  (DELTASONE ) 5 MG tablet Take 1 tablet (5 mg total) by mouth at bedtime. Resume after finished with prednisone  taper from hospitalization (Patient taking differently: Take 5 mg by mouth daily with breakfast.)     tocilizumab  4 mg/kg in sodium chloride  0.9 % Inject 280 mg into the vein every 28 (twenty-eight) days. (Patient not taking: Reported on 04/23/2024)     No current facility-administered medications for this visit.     Review of Systems   She denies chest pain, palpitations, dyspnea, pnd, orthopnea, n, v, dizziness, syncope, edema, weight gain, or early satiety. All other systems reviewed and are otherwise negative except as noted above.   Physical Exam    VS:  BP (!) 144/60 (BP Location: Right Arm, Patient Position: Sitting, Cuff Size: Small)   Pulse 79   Ht 5' 2 (1.575 m)   Wt 149 lb (67.6 kg)   SpO2 90%   BMI 27.25 kg/m  GEN: Well nourished, well developed, in no acute distress. HEENT: normal. Neck: Supple, no JVD, carotid bruits, or masses. Cardiac: RRR, no murmurs, rubs, or gallops. No clubbing, cyanosis, edema.  Radials/DP/PT 2+ and equal bilaterally.  Respiratory:  Respirations regular and unlabored, clear to auscultation bilaterally. GI: Soft, nontender, nondistended, BS + x 4. MS: no deformity or atrophy. Skin: warm and dry, no rash. Neuro:  Strength and sensation are intact. Psych: Normal affect.  Accessory Clinical Findings    ECG personally reviewed by me today -    - no EKG in office today.    Lab Results  Component Value Date   WBC 10.4 04/14/2024    HGB 10.2 (L) 04/14/2024   HCT 33.7 (L) 04/14/2024   MCV 95.7 04/14/2024  PLT 203 04/14/2024   Lab Results  Component Value Date   CREATININE 0.93 04/14/2024   BUN 27 (H) 04/14/2024   NA 142 04/14/2024   K 4.8 04/14/2024   CL 103 04/14/2024   CO2 29 04/14/2024   Lab Results  Component Value Date   ALT 9 04/13/2024   AST 27 04/13/2024   ALKPHOS 121 04/13/2024   BILITOT 0.4 04/13/2024   Lab Results  Component Value Date   CHOL 284 (H) 01/13/2018   HDL 41.70 01/13/2018   LDLDIRECT 157.0 08/18/2019   TRIG (H) 01/13/2018    753.0 Triglyceride is over 400; calculations on Lipids are invalid.   CHOLHDL 7 01/13/2018    No results found for: HGBA1C  Assessment & Plan    1. HFrEF: Echocardiogram in 03/2024 showed EF 35 to 40%, moderate left atrial enlargement, moderate mitral valve regurgitation, mild to moderate aortic insufficiency.  Has had multiple recent hospitalizations in the setting of acute on chronic HFrEF.  Carvedilol  was discontinued in the setting of fatigue.  SGLT2 inhibitor was deferred in the setting of urinary incontinence with limited mobility. Euvolemic and well compensated on exam.  Will plan for repeat echocardiogram following 3 months of GDMT to reevaluate LVEF.  If EF remains reduced, consider escalation of GDMT.  Given advanced age, somewhat labile BP, will defer for now.  Will update CBC, BMET today.  Continue Entresto , spironolactone , Lasix .  2. Valvular heart disease: Recent echocardiogram with moderate mitral valve regurgitation, mild to moderate aortic insufficiency as above.  Euvolemic and well compensated on exam.  Repeat echocardiogram as clinically indicated, though she is not inclined to pursue any invasive procedures. Continue Lasix .  3. Hypertension: BP mildly elevated in office today, has been well-controlled at home.  Continue to monitor BP and report BP consistently greater than 140/90 mmHg.  For now, continue current antihypertensive regimen.    4. Hyperlipidemia: No recent LDL on file.  Monitored and managed per PCP.  Continue Lovaza .  5. CKD stage III: Creatinine was 0.930 in 03/2024.  Will repeat BMET as above.  6. Chronic hypoxic respiratory failure: On home O2 2L continuous.  Of note, she did not bring her oxygen  with her to her appointment today.  Followed by pulmonology.  7. Liver mass: Incidental finding on recent imaging.  She has elected to not pursue aggressive treatment.  8. Disposition:  Follow-up in 3 to 4 months with Dr. Pietro.   Damien JAYSON Braver, NP 04/23/2024, 4:20 PM       [1]  Allergies Allergen Reactions   Oxycodone  Itching and Other (See Comments)    Hallucinations   Statins Other (See Comments)    Muscle pain   Advair Diskus [Fluticasone -Salmeterol] Other (See Comments)    Mouth sores   Atorvastatin  Other (See Comments)    Severe headache   Sulfonamide Derivatives Rash   "

## 2024-04-29 ENCOUNTER — Telehealth

## 2024-05-03 ENCOUNTER — Telehealth: Admitting: Licensed Clinical Social Worker

## 2024-06-02 ENCOUNTER — Ambulatory Visit: Admitting: Family Medicine

## 2024-06-24 ENCOUNTER — Ambulatory Visit (HOSPITAL_COMMUNITY)

## 2024-08-24 ENCOUNTER — Ambulatory Visit: Admitting: Cardiology

## 2024-08-30 ENCOUNTER — Ambulatory Visit
